# Patient Record
Sex: Male | Born: 1995 | Race: Black or African American | Hispanic: No | Marital: Single | Smoking: Current every day smoker
Health system: Southern US, Community
[De-identification: ages and names within clinical notes are randomized; demographics above are authoritative.]

## PROBLEM LIST (undated history)

## (undated) ENCOUNTER — Emergency Department: Discharge status: Absent without leave | Payer: Medicaid Other

## (undated) DIAGNOSIS — F29 Unspecified psychosis not due to a substance or known physiological condition: Secondary | ICD-10-CM

## (undated) DIAGNOSIS — F329 Major depressive disorder, single episode, unspecified: Secondary | ICD-10-CM

## (undated) DIAGNOSIS — F32A Depression, unspecified: Secondary | ICD-10-CM

## (undated) DIAGNOSIS — F259 Schizoaffective disorder, unspecified: Secondary | ICD-10-CM

## (undated) DIAGNOSIS — J45909 Unspecified asthma, uncomplicated: Secondary | ICD-10-CM

## (undated) DIAGNOSIS — R4689 Other symptoms and signs involving appearance and behavior: Secondary | ICD-10-CM

## (undated) HISTORY — PX: BACK SURGERY: SHX140

---
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
## 2017-02-03 DIAGNOSIS — F061 Catatonic disorder due to known physiological condition: Secondary | ICD-10-CM | POA: Insufficient documentation

## 2017-05-01 IMAGING — DX DG KNEE COMPLETE 4+V*L*
4 series · 4 of 4 positions shown · non-contrast
Comparison: None.

CLINICAL DATA: Pain to the bilateral knees after fall.

EXAM:
LEFT KNEE - COMPLETE 4+ VIEW

[knee ap]
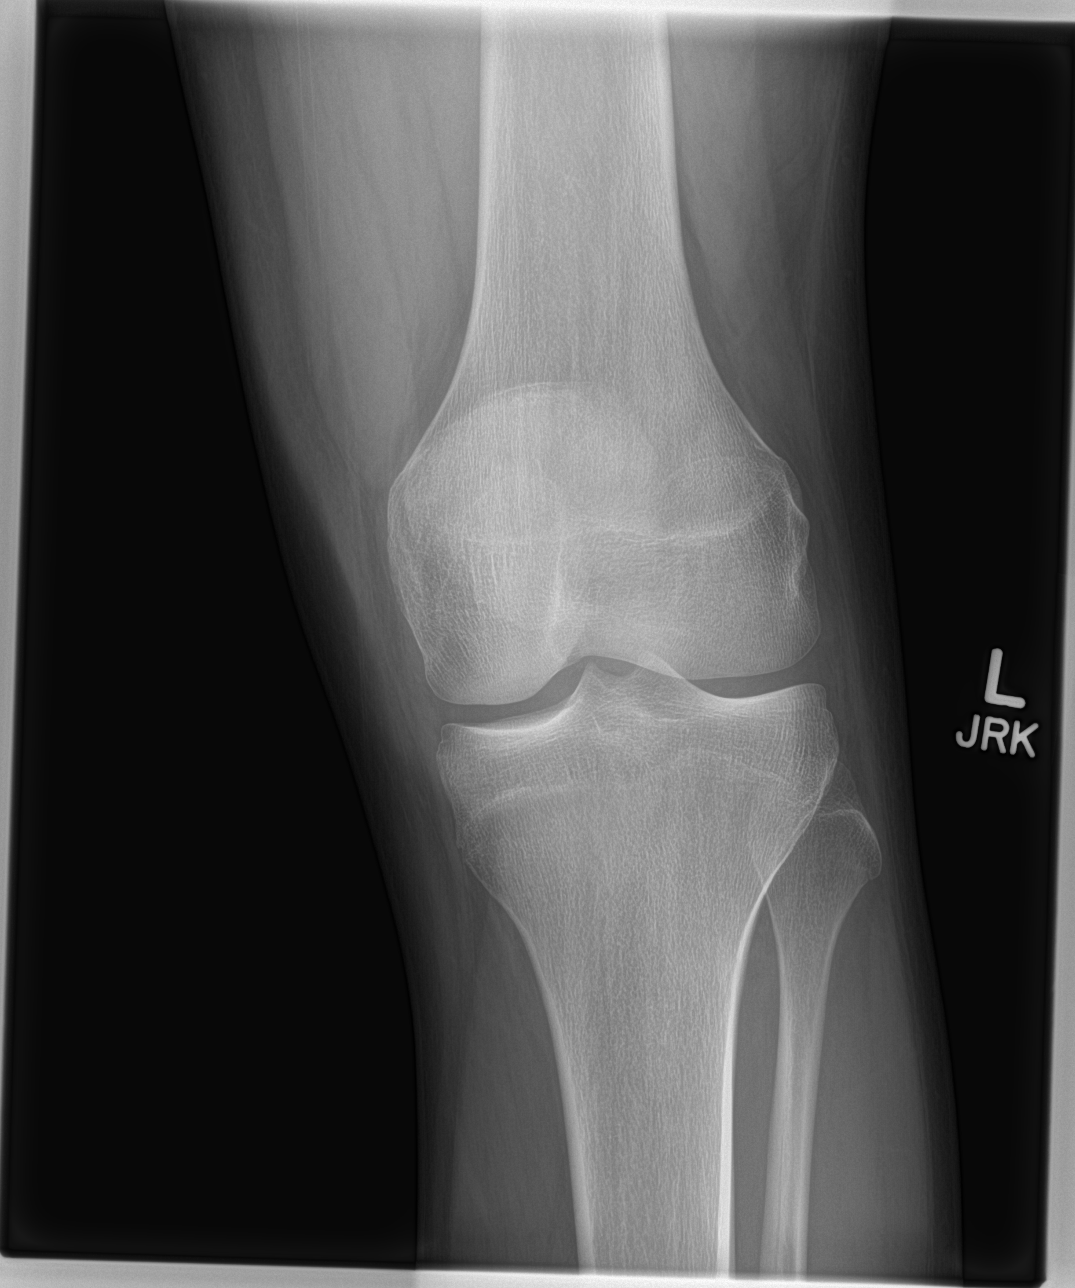

[knee obl (1 of 2)]
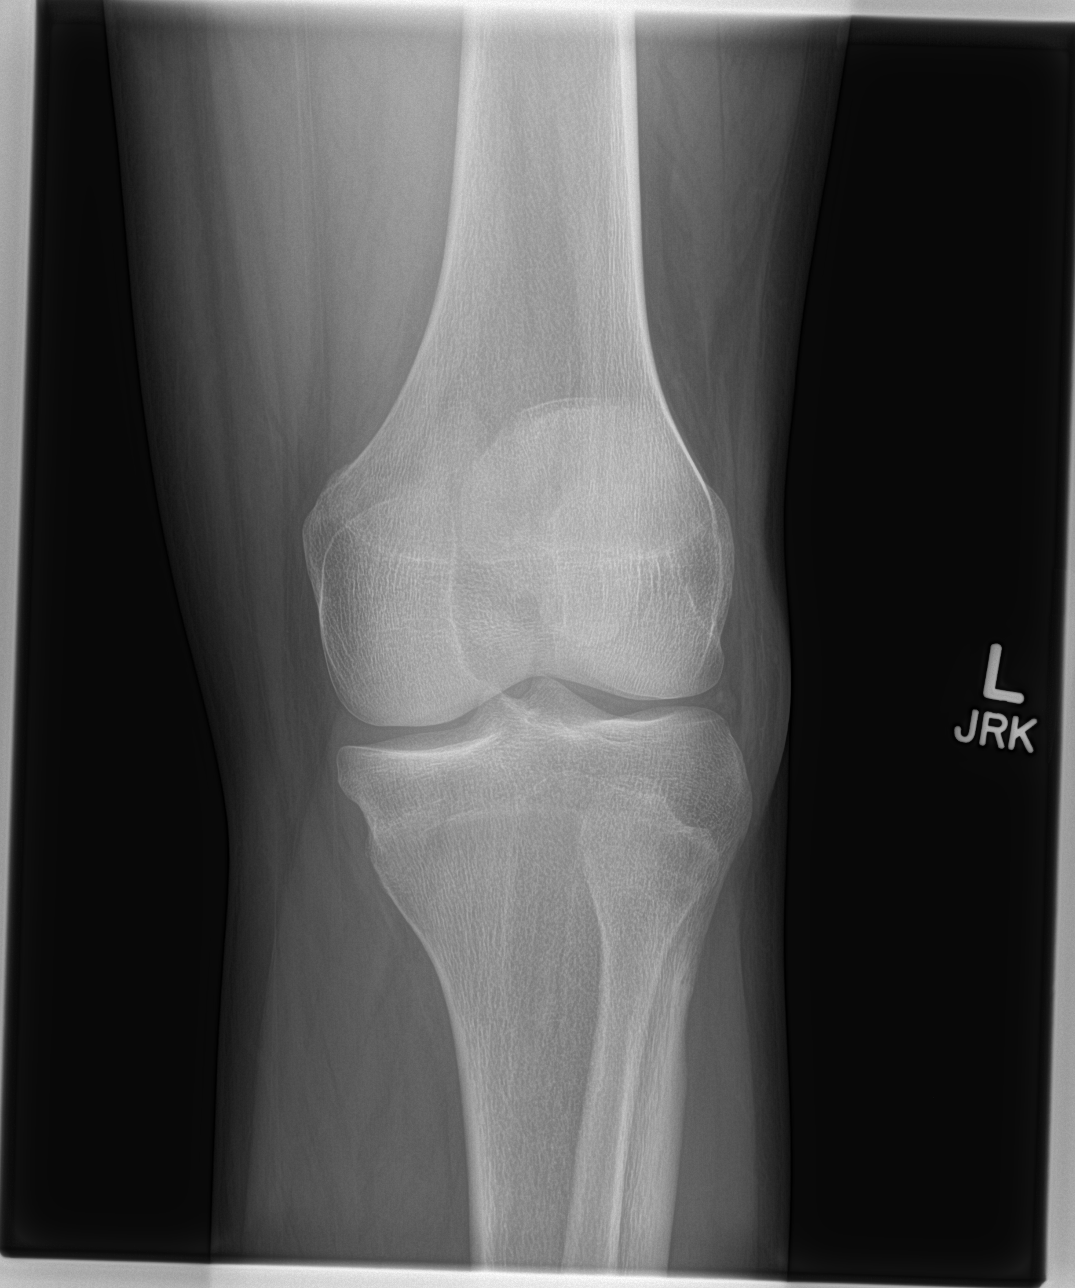

[knee obl (2 of 2)]
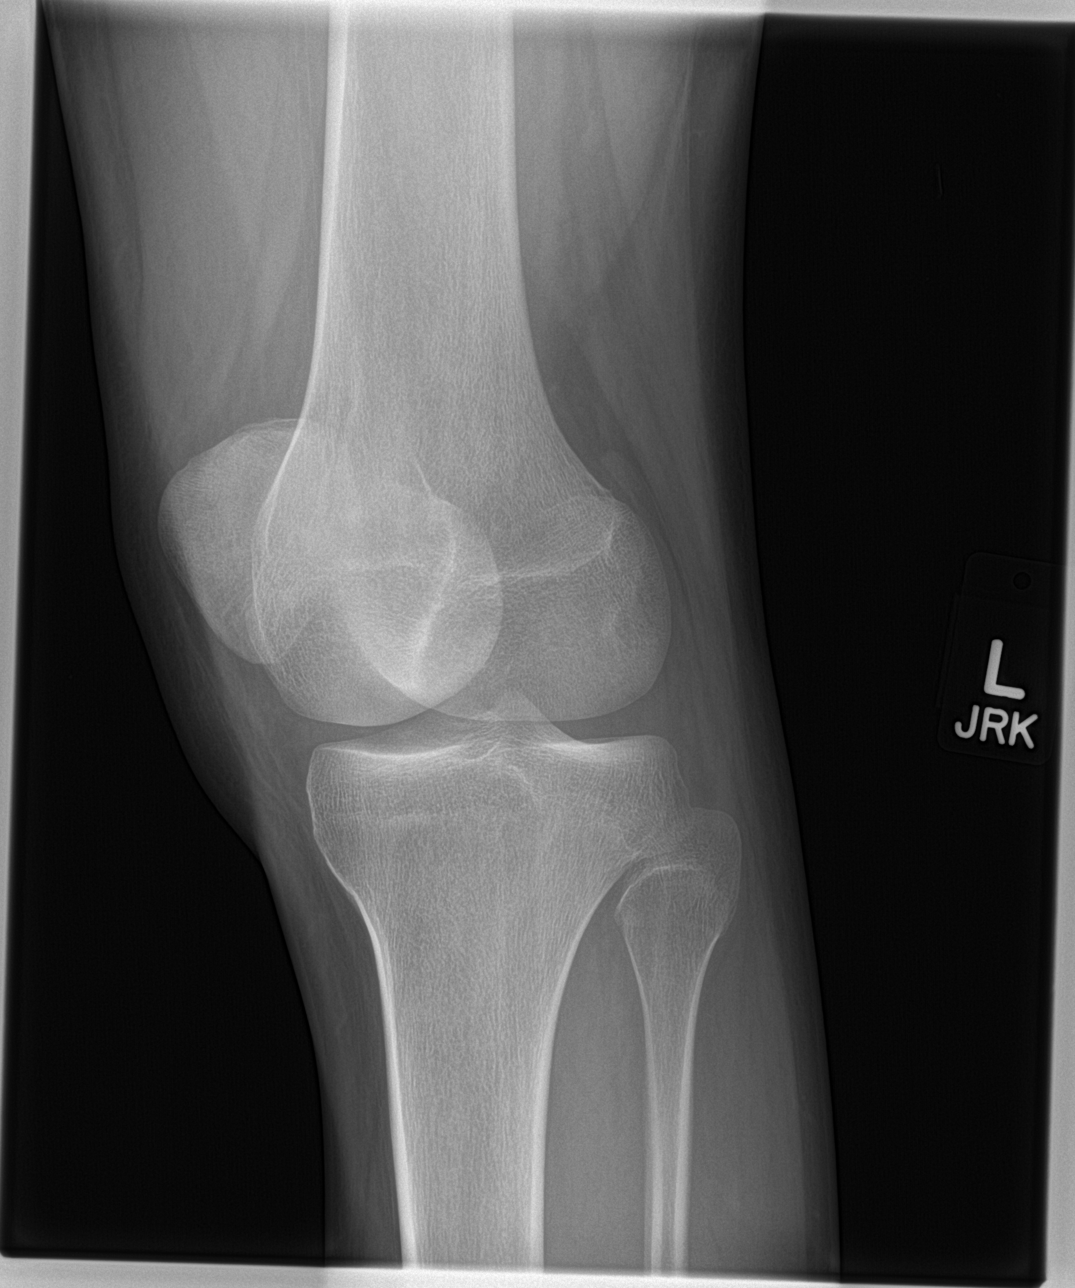

[knee lat]
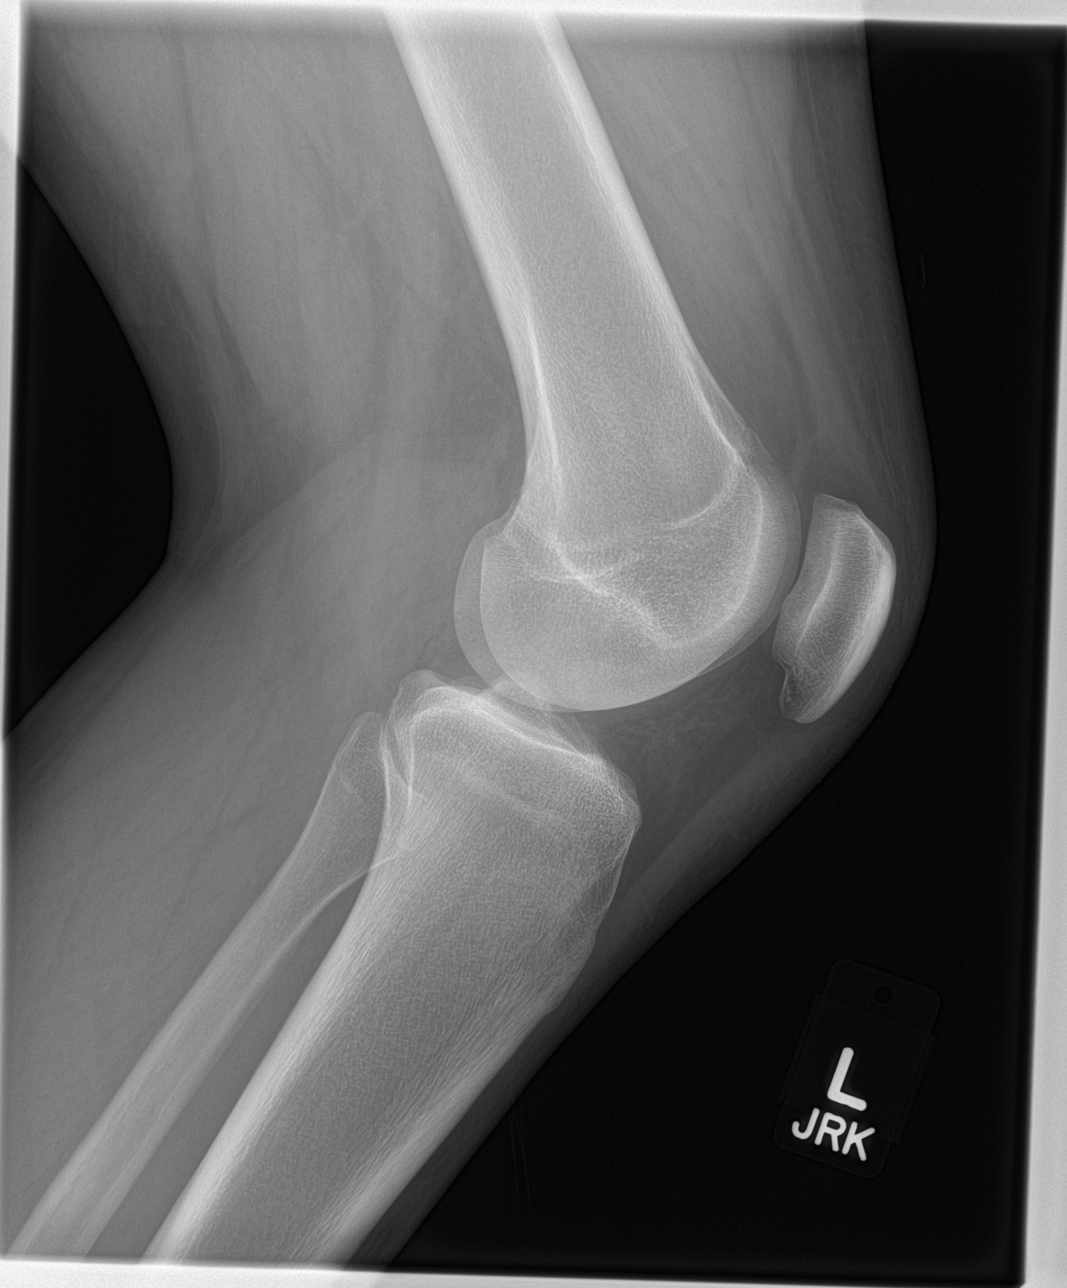

[4 of 4 positions shown; findings below may reference images not displayed]

FINDINGS: There is no evidence of fracture, dislocation, or joint effusion.
There is no evidence of arthropathy or other focal bone abnormality.
Soft tissues are unremarkable.
IMPRESSION: Negative.

## 2017-07-07 DIAGNOSIS — E669 Obesity, unspecified: Secondary | ICD-10-CM | POA: Insufficient documentation

## 2017-07-07 DIAGNOSIS — R293 Abnormal posture: Secondary | ICD-10-CM | POA: Insufficient documentation

## 2017-07-07 DIAGNOSIS — R030 Elevated blood-pressure reading, without diagnosis of hypertension: Secondary | ICD-10-CM | POA: Insufficient documentation

## 2017-07-07 DIAGNOSIS — R0683 Snoring: Secondary | ICD-10-CM | POA: Insufficient documentation

## 2017-08-25 DIAGNOSIS — B002 Herpesviral gingivostomatitis and pharyngotonsillitis: Secondary | ICD-10-CM | POA: Insufficient documentation

## 2017-12-30 ENCOUNTER — Other Ambulatory Visit: Payer: Self-pay

## 2017-12-30 ENCOUNTER — Emergency Department
Admission: EM | Admit: 2017-12-30 | Discharge: 2017-12-30 | Disposition: A | Discharge status: Home / Self Care | Payer: Medicaid Other | Attending: Emergency Medicine | Admitting: Emergency Medicine

## 2017-12-30 ENCOUNTER — Encounter: Payer: Self-pay | Admitting: Emergency Medicine

## 2017-12-30 ENCOUNTER — Inpatient Hospital Stay
Admission: AD | Admit: 2017-12-30 | Discharge: 2018-01-29 | DRG: 885 | Disposition: A | Discharge status: Home / Self Care | Payer: Medicaid Other | Attending: Psychiatry | Admitting: Psychiatry

## 2017-12-30 DIAGNOSIS — F209 Schizophrenia, unspecified: Secondary | ICD-10-CM | POA: Insufficient documentation

## 2017-12-30 DIAGNOSIS — G4700 Insomnia, unspecified: Secondary | ICD-10-CM | POA: Diagnosis present

## 2017-12-30 DIAGNOSIS — F250 Schizoaffective disorder, bipolar type: Principal | ICD-10-CM | POA: Diagnosis present

## 2017-12-30 DIAGNOSIS — F203 Undifferentiated schizophrenia: Secondary | ICD-10-CM

## 2017-12-30 DIAGNOSIS — Z91199 Patient's noncompliance with other medical treatment and regimen due to unspecified reason: Secondary | ICD-10-CM

## 2017-12-30 DIAGNOSIS — J45909 Unspecified asthma, uncomplicated: Secondary | ICD-10-CM | POA: Diagnosis present

## 2017-12-30 DIAGNOSIS — F99 Mental disorder, not otherwise specified: Secondary | ICD-10-CM | POA: Diagnosis present

## 2017-12-30 DIAGNOSIS — Z91148 Patient's other noncompliance with medication regimen for other reason: Secondary | ICD-10-CM

## 2017-12-30 DIAGNOSIS — Z9119 Patient's noncompliance with other medical treatment and regimen: Secondary | ICD-10-CM

## 2017-12-30 DIAGNOSIS — I10 Essential (primary) hypertension: Secondary | ICD-10-CM | POA: Diagnosis present

## 2017-12-30 DIAGNOSIS — F329 Major depressive disorder, single episode, unspecified: Secondary | ICD-10-CM | POA: Diagnosis not present

## 2017-12-30 DIAGNOSIS — Z9114 Patient's other noncompliance with medication regimen: Secondary | ICD-10-CM

## 2017-12-30 LAB — COMPREHENSIVE METABOLIC PANEL
ALBUMIN: 4.6 g/dL (ref 3.5–5.0)
ALT: 43 U/L (ref 17–63)
AST: 42 U/L — AB (ref 15–41)
Alkaline Phosphatase: 96 U/L (ref 38–126)
Anion gap: 10 (ref 5–15)
BUN: 12 mg/dL (ref 6–20)
CHLORIDE: 106 mmol/L (ref 101–111)
CO2: 23 mmol/L (ref 22–32)
Calcium: 9.3 mg/dL (ref 8.9–10.3)
Creatinine, Ser: 0.78 mg/dL (ref 0.61–1.24)
GFR calc Af Amer: >60 mL/min (ref >60)
GFR calc non Af Amer: >60 mL/min (ref >60)
GLUCOSE: 126 mg/dL — AB (ref 65–99)
POTASSIUM: 3.1 mmol/L — AB (ref 3.5–5.1)
Sodium: 139 mmol/L (ref 135–145)
Total Bilirubin: 1.2 mg/dL (ref 0.3–1.2)
Total Protein: 7.8 g/dL (ref 6.5–8.1)

## 2017-12-30 LAB — URINE DRUG SCREEN, QUALITATIVE (ARMC ONLY)
AMPHETAMINES, UR SCREEN: NOT DETECTED
BENZODIAZEPINE, UR SCRN: NOT DETECTED
Barbiturates, Ur Screen: NOT DETECTED
COCAINE METABOLITE, UR ~~LOC~~: NOT DETECTED
Cannabinoid 50 Ng, Ur ~~LOC~~: NOT DETECTED
MDMA (Ecstasy)Ur Screen: NOT DETECTED
Methadone Scn, Ur: NOT DETECTED
OPIATE, UR SCREEN: NOT DETECTED
Phencyclidine (PCP) Ur S: NOT DETECTED
Tricyclic, Ur Screen: NOT DETECTED

## 2017-12-30 LAB — CBC
HCT: 45.4 % (ref 40.0–52.0)
Hemoglobin: 15.9 g/dL (ref 13.0–18.0)
MCH: 28.9 pg (ref 26.0–34.0)
MCHC: 34.9 g/dL (ref 32.0–36.0)
MCV: 82.8 fL (ref 80.0–100.0)
PLATELETS: 274 10*3/uL (ref 150–440)
RBC: 5.49 MIL/uL (ref 4.40–5.90)
RDW: 14.1 % (ref 11.5–14.5)
WBC: 5.6 10*3/uL (ref 3.8–10.6)

## 2017-12-30 LAB — ETHANOL

## 2017-12-30 LAB — ACETAMINOPHEN LEVEL: Acetaminophen (Tylenol), Serum: <10 ug/mL — ABNORMAL LOW (ref 10–30)

## 2017-12-30 LAB — SALICYLATE LEVEL: Salicylate Lvl: <7 mg/dL (ref 2.8–30.0)

## 2017-12-30 MED ORDER — OLANZAPINE 5 MG PO TABS: 5.0000 mg | ORAL_TABLET | Freq: Every day | ORAL | Status: DC

## 2017-12-30 MED ORDER — ACETAMINOPHEN 325 MG PO TABS: 650.0000 mg | ORAL_TABLET | Freq: Four times a day (QID) | ORAL | Status: DC | PRN

## 2017-12-30 MED ORDER — ALUM & MAG HYDROXIDE-SIMETH 200-200-20 MG/5ML PO SUSP: 30.0000 mL | ORAL | Status: DC | PRN

## 2017-12-30 MED ORDER — HYDROXYZINE HCL 50 MG PO TABS
50.0000 mg | ORAL_TABLET | Freq: Three times a day (TID) | ORAL | Status: DC | PRN
  Administered 2017-12-30 – 2018-01-27 (×14): 50 mg via ORAL
  Filled 2017-12-30 (×15): qty 1

## 2017-12-30 MED ORDER — OLANZAPINE 5 MG PO TABS
5.0000 mg | ORAL_TABLET | Freq: Every day | ORAL | Status: DC
  Administered 2017-12-30 – 2017-12-31 (×2): 5 mg via ORAL
  Filled 2017-12-30 (×2): qty 1

## 2017-12-30 MED ORDER — MAGNESIUM HYDROXIDE 400 MG/5ML PO SUSP
30.0000 mL | Freq: Every day | ORAL | Status: DC | PRN
  Administered 2018-01-15: 30 mL via ORAL
  Filled 2017-12-30: qty 30

## 2017-12-30 MED ORDER — TRAZODONE HCL 100 MG PO TABS
100.0000 mg | ORAL_TABLET | Freq: Every evening | ORAL | Status: DC | PRN
  Administered 2017-12-30 – 2018-01-27 (×22): 100 mg via ORAL
  Filled 2017-12-30 (×22): qty 1

## 2017-12-30 MED ORDER — POTASSIUM CHLORIDE CRYS ER 20 MEQ PO TBCR
40.0000 meq | EXTENDED_RELEASE_TABLET | Freq: Once | ORAL | Status: AC
  Administered 2017-12-30: 40 meq via ORAL
  Filled 2017-12-30: qty 2

## 2017-12-30 NOTE — ED Notes (Signed)
BEHAVIORAL HEALTH ROUNDING Patient sleeping: No. Patient alert and oriented: yes Behavior appropriate: Yes.  ; If no, describe:  Nutrition and fluids offered: yes Toileting and hygiene offered: Yes  Sitter present: q15 minute observations and security camera monitoring Law enforcement present: Yes  ODS  

## 2017-12-30 NOTE — ED Notes (Signed)
Hourly rounding reveals patient sleeping in room. No complaints, stable, in no acute distress. Q15 minute rounds and monitoring via Security Cameras to continue. 

## 2017-12-30 NOTE — ED Notes (Signed)
BEHAVIORAL HEALTH ROUNDING Patient sleeping: No. Patient alert and oriented: yes Behavior appropriate: Yes.  ; If no, describe:  Nutrition and fluids offered: yes Toileting and hygiene offered: Yes  Sitter present: q15 minute observations and security camera monitoring Law enforcement present: Yes  ODS security   ENVIRONMENTAL ASSESSMENT Potentially harmful objects out of patient reach: Yes.   Personal belongings secured: Yes.   Patient dressed in hospital provided attire only: Yes.   Plastic bags out of patient reach: Yes.   Patient care equipment (cords, cables, call bells, lines, and drains) shortened, removed, or accounted for: Yes.   Equipment and supplies removed from bottom of stretcher: Yes.   Potentially toxic materials out of patient reach: Yes.   Sharps container removed or out of patient reach: Yes.

## 2017-12-30 NOTE — Tx Team (Signed)
Received [name] [name] from there ED, he is alert but very disorganized, distracted, restless and unable to answer the admission questions. H is eyes are red with a distance stare.  He verbalized he is here because of trouble with his family and mother He ate a snack and sandwich and was escorted to his room. He was redirected back to his room on several occassions. The skin assessment was completed with nurse Jon GillsAlexis.
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
## 2017-12-30 NOTE — ED Notes (Signed)
ED  Is the patient under IVC or is there intent for IVC: Yes.   Is the patient medically cleared: Yes.   Is there vacancy in the ED BHU: Yes.   Is the population mix appropriate for patient: Yes.   Is the patient awaiting placement in inpatient or outpatient setting: Yes.   Has the patient had a psychiatric consult:  Consult pending Survey of unit performed for contraband, proper placement and condition of furniture, tampering with fixtures in bathroom, shower, and each patient room: Yes.  ; Findings:  APPEARANCE/BEHAVIOR Calm and cooperative NEURO ASSESSMENT Orientation: oriented x3  Denies pain Hallucinations:   Speech: Normal Gait: normal RESPIRATORY ASSESSMENT Even  Unlabored respirations  CARDIOVASCULAR ASSESSMENT Pulses equal   regular rate  Skin warm and dry   GASTROINTESTINAL ASSESSMENT no GI complaint EXTREMITIES Full ROM  PLAN OF CARE Provide calm/safe environment. Vital signs assessed twice daily. ED BHU Assessment once each 12-hour shift. Collaborate with TTS daily or as condition indicates. Assure the ED provider has rounded once each shift. Provide and encourage hygiene. Provide redirection as needed. Assess for escalating behavior; address immediately and inform ED provider.  Assess family dynamic and appropriateness for visitation as needed: Yes.  ; If necessary, describe findings:  Educate the patient/family about BHU procedures/visitation: Yes.  ; If necessary, describe findings:
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
## 2017-12-30 NOTE — ED Notes (Signed)
BEHAVIORAL HEALTH ROUNDING Patient sleeping: Yes.   Patient alert and oriented: eyes closed  Appears asleep Behavior appropriate: Yes.  ; If no, describe:  Nutrition and fluids offered: Yes  Toileting and hygiene offered: sleeping Sitter present: q 15 minute observations and security camera monitoring Law enforcement present: yes  ODS 

## 2017-12-30 NOTE — Tx Team (Signed)
Initial Treatment Plan 12/30/2017 10:10 PM [name] [name] [id number]    PATIENT STRESSORS: Medication change or noncompliance   PATIENT STRENGTHS: Capable of independent living Manufacturing systems engineerCommunication skills Physical Health Supportive family/friends   PATIENT IDENTIFIED PROBLEMS: Non compliance with medications   Difficult relationship with mother                    DISCHARGE CRITERIA:  Ability to meet basic life and health needs Adequate post-discharge living arrangements Improved stabilization in mood, thinking, and/or behavior  PRELIMINARY DISCHARGE PLAN: Outpatient therapy Return to previous living arrangement Return to previous work or school arrangements  PATIENT/FAMILY INVOLVEMENT: This treatment plan has been presented to and reviewed with the patient, [name] [name].  The patient has been given the opportunity to ask questions and make suggestions.  Rex KrasJoanne  Loryn Haacke, RN 12/30/2017, 10:10 PM

## 2017-12-30 NOTE — BH Assessment (Signed)
[facility] by Dr. Toni Amendlapacs.  Attending Physician will be Dr. Jennet MaduroPucilowska.   [facility] [place]wen F.   Intake Paper Work has been signed and placed on patient chart.  ER staff is aware of the admission:  Misty StanleyLisa, ER Kathrynn SpeedSectary   Dr. Derrill KayGoodman, ER MD   Amy T., Patient's Nurse   Mertie ClauseJeanelle, Patient Access.

## 2017-12-30 NOTE — ED Notes (Signed)
Hourly rounding reveals patient in room. No complaints, stable, in no acute distress. Q15 minute rounds and monitoring via Security Cameras to continue. 

## 2017-12-30 NOTE — ED Notes (Addendum)
Patient observed lying in bed with eyes closed  Even, unlabored respirations observed   NAD pt appears to be sleeping  I will continue to monitor along with every 15 minute visual observations and ongoing security camera monitoring    

## 2017-12-30 NOTE — Consult Note (Signed)
[facility] Face-to-Face Psychiatry Consult   Reason for Consult: Consult for the 22 year old man with a history of psychotic disorder brought here under IVC filed by mobile crisis. Referring Physician: Archie Balboa Patient Identification: [name] [name] MRN:  [id number] Principal Diagnosis: Schizophrenia, undifferentiated ([place]) Diagnosis:   Patient Active Problem List   Diagnosis Date Noted  . Schizophrenia, undifferentiated ([place]) [F20.3] 12/30/2017  . Noncompliance [Z91.19] 12/30/2017  . Aggressive behavior of adolescent [R46.89] 10/24/2015    Total Time spent with patient: 1 hour  Subjective:   [name] [name] is a 22 y.o. male patient admitted with "I think my mother worries too much".  HPI: Patient interviewed chart reviewed.  22 year old man with a history of psychotic disorder brought in under IVC filed by mobile crisis.  The patient's story is that he had decided to go out for a walk in the night time to walk around the neighborhood and go to a local park.  He says that his mother was bothered by this but he cannot speculate exactly why.  He says that "my mother is bothered by my expression of young manhood".  Patient denies having any other odd behavior.  According to the commitment paperwork however he had been found sitting in a car belonging to someone else acting like he was driving the car.  Mobile crisis was called and spent some time talking with him and says the more they talk with him the more they felt that he was psychotic and would benefit from hospitalization.  Patient admits that he has been off of his psychiatric medicine for at least 6 months.  He does not feel like he really needs it.  He denies that he has been having auditory or visual hallucinations.  He denies feeling paranoid.  Some of the collateral information however suggests that he was thinking about killing himself or at least making some odd comments.  Patient denies that he is using drugs or alcohol.  Insight is  poor.  Social history: Lives with his mother and younger brother.  Not currently working or going to school.  Not doing much during the day.  Dropped out of high school because of mental illness but got a GED.  Father not involved in his life.  Patient seems to have some awareness that guardianship was an issue that had been brought up and expresses a fear that that would happen.  Medical history: No significant medical problems known outside of the psychiatric  Substance abuse history: Denies alcohol or drug abuse    Past Psychiatric History: Patient had several hospitalizations at [facility] before he was 56.  Diagnosis was made of schizophrenia.  Has been treated outpatient with medication including clozapine and Zyprexa.  Poor insight.  Denies any history of suicide attempts or violence.  Risk to Self: Suicidal Ideation: No Suicidal Intent: No Is patient at risk for suicide?: No Suicidal Plan?: No Access to Means: No What has been your use of drugs/alcohol within the last 12 months?: Reports of none How many times?: 0 Other Self Harm Risks: Reports of none Triggers for Past Attempts: None known Intentional Self Injurious Behavior: None Risk to Others: Homicidal Ideation: No Thoughts of Harm to Others: No Current Homicidal Intent: No Current Homicidal Plan: No Access to Homicidal Means: No Identified Victim: Reports of none History of harm to others?: No Assessment of Violence: None Noted Violent Behavior Description: Reports of none Does patient have access to weapons?: No Criminal Charges Pending?: No Does patient have a court  date: No Prior Inpatient Therapy: Prior Inpatient Therapy: Yes Prior Therapy Dates: 10/2016, 12/2015 & 10/2015 Prior Therapy Facilty/Provider(s): [facility] Reason for Treatment: Schizophrenia Prior Outpatient Therapy: Prior Outpatient Therapy: Yes Prior Therapy Dates: 2019 Prior Therapy Facilty/Provider(s): [facility] Reason for Treatment: Schizophrenia Does  patient have an ACCT team?: No Does patient have Intensive In-House Services?  : No Does patient have [facility] services? : No Does patient have P4CC services?: No  Past Medical History:  Past Medical History:  Diagnosis Date  . Asthma   . Depression   . Psychosis [facility])     Past Surgical History:  Procedure Laterality Date  . BACK SURGERY     Family History: No family history on file. Family Psychiatric  History: Does not know of any Social History:  Social History   Substance and Sexual Activity  Alcohol Use No     Social History   Substance and Sexual Activity  Drug Use Yes  . Types: Marijuana    Social History   Socioeconomic History  . Marital status: Single    Spouse name: Not on file  . Number of children: Not on file  . Years of education: Not on file  . Highest education level: Not on file  Occupational History  . Not on file  Social Needs  . Financial resource strain: Not on file  . Food insecurity:    Worry: Not on file    Inability: Not on file  . Transportation needs:    Medical: Not on file    Non-medical: Not on file  Tobacco Use  . Smoking status: Never Smoker  . Smokeless tobacco: Never Used  Substance and Sexual Activity  . Alcohol use: No  . Drug use: Yes    Types: Marijuana  . Sexual activity: Not on file  Lifestyle  . Physical activity:    Days per week: Not on file    Minutes per session: Not on file  . Stress: Not on file  Relationships  . Social connections:    Talks on phone: Not on file    Gets together: Not on file    Attends religious service: Not on file    Active member of club or organization: Not on file    Attends meetings of clubs or organizations: Not on file    Relationship status: Not on file  Other Topics Concern  . Not on file  Social History Narrative  . Not on file   Additional Social History:    Allergies:  No Known Allergies  Labs:  Results for orders placed or performed during the hospital  encounter of 12/30/17 (from the past 48 hour(s))  CBC     Status: None   Collection Time: 12/30/17  1:23 AM  Result Value Ref Range   WBC 5.6 3.8 - 10.6 K/uL   RBC 5.49 4.40 - 5.90 MIL/uL   Hemoglobin 15.9 13.0 - 18.0 g/dL   HCT 45.4 40.0 - 52.0 %   MCV 82.8 80.0 - 100.0 fL   MCH 28.9 26.0 - 34.0 pg   MCHC 34.9 32.0 - 36.0 g/dL   RDW 14.1 11.5 - 14.5 %   Platelets 274 150 - 440 K/uL    Comment: Performed at [facility], [address] [place], [place] [address]  Comprehensive metabolic panel     Status: Abnormal   Collection Time: 12/30/17  1:23 AM  Result Value Ref Range   Sodium 139 135 - 145 mmol/L   Potassium 3.1 (  L) 3.5 - 5.1 mmol/L   Chloride 106 101 - 111 mmol/L   CO2 23 22 - 32 mmol/L   Glucose, Bld 126 (H) 65 - 99 mg/dL   BUN 12 6 - 20 mg/dL   Creatinine, Ser 0.78 0.61 - 1.24 mg/dL   Calcium 9.3 8.9 - 10.3 mg/dL   Total Protein 7.8 6.5 - 8.1 g/dL   Albumin 4.6 3.5 - 5.0 g/dL   AST 42 (H) 15 - 41 U/L   ALT 43 17 - 63 U/L   Alkaline Phosphatase 96 38 - 126 U/L   Total Bilirubin 1.2 0.3 - 1.2 mg/dL   GFR calc non Af Amer >60 >60 mL/min   GFR calc Af Amer >60 >60 mL/min    Comment: (NOTE) The eGFR has been calculated using the CKD EPI equation. This calculation has not been validated in all clinical situations. eGFR's persistently <60 mL/min signify possible Chronic Kidney Disease.    Anion gap 10 5 - 15    Comment: Performed at [facility], [address] [place], [place] [address]  Ethanol     Status: None   Collection Time: 12/30/17  1:23 AM  Result Value Ref Range   Alcohol, Ethyl (B) <10 <10 mg/dL    Comment: (NOTE) Lowest detectable limit for serum alcohol is 10 mg/dL. For medical purposes only. Performed at [facility], [address] [place], [place] [address]   Salicylate level     Status: None   Collection Time: 12/30/17  1:23 AM  Result Value Ref Range   Salicylate Lvl <6.9 2.8 - 30.0 mg/dL    Comment:  Performed at [facility], [address] [place], [place] [address]  Urine Drug Screen, Qualitative ([facility] only)     Status: None   Collection Time: 12/30/17  1:23 AM  Result Value Ref Range   Tricyclic, Ur Screen NONE DETECTED NONE DETECTED   Amphetamines, Ur Screen NONE DETECTED NONE DETECTED   MDMA (Ecstasy)Ur Screen NONE DETECTED NONE DETECTED   Cocaine Metabolite,Ur [place] NONE DETECTED NONE DETECTED   Opiate, Ur Screen NONE DETECTED NONE DETECTED   Phencyclidine (PCP) Ur S NONE DETECTED NONE DETECTED   Cannabinoid 50 Ng, Ur [place] NONE DETECTED NONE DETECTED   Barbiturates, Ur Screen NONE DETECTED NONE DETECTED   Benzodiazepine, Ur Scrn NONE DETECTED NONE DETECTED   Methadone Scn, Ur NONE DETECTED NONE DETECTED    Comment: (NOTE) Tricyclics + metabolites, urine    Cutoff 1000 ng/mL Amphetamines + metabolites, urine  Cutoff 1000 ng/mL MDMA (Ecstasy), urine              Cutoff 500 ng/mL Cocaine Metabolite, urine          Cutoff 300 ng/mL Opiate + metabolites, urine        Cutoff 300 ng/mL Phencyclidine (PCP), urine         Cutoff 25 ng/mL Cannabinoid, urine                 Cutoff 50 ng/mL Barbiturates + metabolites, urine  Cutoff 200 ng/mL Benzodiazepine, urine              Cutoff 200 ng/mL Methadone, urine                   Cutoff 300 ng/mL The urine drug screen provides only a preliminary, unconfirmed analytical test result and should not be used for non-medical purposes. Clinical consideration and professional judgment should be applied to any positive drug  screen result due to possible interfering substances. A more specific alternate chemical method must be used in order to obtain a confirmed analytical result. Gas chromatography / mass spectrometry (GC/MS) is the preferred confirmat ory method. Performed at [facility], [address] [place], [place] [address]   Acetaminophen level     Status: Abnormal   Collection Time: 12/30/17  1:23 AM  Result Value  Ref Range   Acetaminophen (Tylenol), Serum <10 (L) 10 - 30 ug/mL    Comment: (NOTE) Therapeutic concentrations vary significantly. A range of 10-30 ug/mL  may be an effective concentration for many patients. However, some  are best treated at concentrations outside of this range. Acetaminophen concentrations >150 ug/mL at 4 hours after ingestion  and >50 ug/mL at 12 hours after ingestion are often associated with  toxic reactions. Performed at [facility], [address] [place], [place] [address]     No current facility-administered medications for this encounter.    Current Outpatient Medications  Medication Sig Dispense Refill  . doxycycline (VIBRA-TABS) 100 MG tablet Take 1 tablet (100 mg total) by mouth 2 (two) times daily. (Patient not taking: Reported on 12/30/2017) 19 tablet 0  . HYDROcodone-acetaminophen (NORCO) 5-325 MG tablet Take 1 tablet by mouth every 6 (six) hours as needed. (Patient not taking: Reported on 12/30/2017) 10 tablet 0    Musculoskeletal: Strength & Muscle Tone: within normal limits Gait & Station: normal Patient leans: N/A  Psychiatric Specialty Exam: Physical Exam  Nursing note and vitals reviewed. Constitutional: He appears well-developed and well-nourished.  HENT:  Head: Normocephalic and atraumatic.  Eyes: Pupils are equal, round, and reactive to light. Conjunctivae are normal.  Neck: Normal range of motion.  Cardiovascular: Regular rhythm and normal heart sounds.  Respiratory: Effort normal. No respiratory distress.  GI: Soft.  Musculoskeletal: Normal range of motion.  Neurological: He is alert.  Skin: Skin is warm and dry.  Psychiatric: His affect is blunt. His speech is delayed. He is slowed. Thought content is paranoid. Cognition and memory are impaired. He expresses inappropriate judgment. He expresses no homicidal and no suicidal ideation. He exhibits abnormal recent memory.    Review of Systems  Constitutional: Negative.    HENT: Negative.   Eyes: Negative.   Respiratory: Negative.   Cardiovascular: Negative.   Gastrointestinal: Negative.   Musculoskeletal: Negative.   Skin: Negative.   Neurological: Negative.   Psychiatric/Behavioral: Positive for memory loss. Negative for depression, hallucinations, substance abuse and suicidal ideas. The patient is nervous/anxious and has insomnia.     Blood pressure (!) 134/92, pulse 86, temperature 98.5 F (36.9 C), temperature source Oral, resp. rate 17, height 6' (1.829 m), weight 86.2 kg (190 lb), SpO2 100 %.Body mass index is 25.77 kg/m.  General Appearance: Fairly Groomed  Eye Contact:  Minimal  Speech:  Garbled  Volume:  Decreased  Mood:  Anxious and Dysphoric  Affect:  Constricted  Thought Process:  Disorganized  Orientation:  Full (Time, Place, and Person)  Thought Content:  Illogical  Suicidal Thoughts:  No  Homicidal Thoughts:  No  Memory:  Immediate;   Fair Recent;   Poor Remote;   Fair  Judgement:  Poor  Insight:  Lacking  Psychomotor Activity:  Decreased  Concentration:  Concentration: Fair  Recall:  [employer] of Knowledge:  Fair  Language:  Fair  Akathisia:  No  Handed:  Right  AIMS (if indicated):     Assets:  Housing Physical Health Resilience Social Support  ADL's:  Intact  Cognition:  Impaired,  Mild  Sleep:        Treatment Plan Summary: Daily contact with patient to assess and evaluate symptoms and progress in treatment, Medication management and Plan 22 year old man with a history of schizophrenia diagnosis.  Has been off of medication for many months because of his poor insight.  Patient presents with some disorganized thinking.  Has odd stereotyped behavior.  Has difficulty getting his thoughts together.  I suspect the diagnosis of schizophrenia is probably correct.  It sounds like he has been having more disturbing behavior at home.  Not really able to make clear decisions for himself at this point.  I agree with the plan  for admission to the psychiatric unit.  Restart low-dose Zyprexa.  According to the chart he has had problems with tolerating Haldol and Abilify in the past.  Used to take clozapine but was not really compliant.  Continue IV C.  Full set of labs will be done.  Case reviewed with ER physician and TTS.  Disposition: Recommend psychiatric Inpatient admission when medically cleared. Supportive therapy provided about ongoing stressors. Discussed crisis plan, support from social network, calling 911, coming to the Emergency Department, and calling Suicide Hotline.  Alethia Berthold, MD 12/30/2017 4:29 PM

## 2017-12-30 NOTE — ED Notes (Signed)
Report to include Situation, Background, Assessment, and Recommendations received from Amy Teague RN. Patient alert and oriented, warm and dry, in no acute distress. Patient denies SI, HI, AVH and pain. Patient made aware of Q15 minute rounds and security cameras for their safety. Patient instructed to come to me with needs or concerns. 

## 2017-12-30 NOTE — ED Notes (Signed)
Pt. Transferred to BHU from ED to room 5 after screening for contraband. Report to include Situation, Background, Assessment and Recommendations from Kim Summers RN. Pt. Oriented to unit including Q15 minute rounds as well as the security cameras for their protection. Patient is alert and oriented, warm and dry in no acute distress. Patient denies SI, HI, and AVH. Pt. Encouraged to let me know if needs arise. 

## 2017-12-30 NOTE — BH Assessment (Signed)
Pt asleep, unable to assess at this time.

## 2017-12-31 ENCOUNTER — Encounter: Payer: Self-pay | Admitting: Psychiatry

## 2017-12-31 DIAGNOSIS — F203 Undifferentiated schizophrenia: Secondary | ICD-10-CM

## 2017-12-31 LAB — CBC WITH DIFFERENTIAL/PLATELET
BASOS ABS: 0 10*3/uL (ref 0–0.1)
BASOS PCT: 1 %
EOS PCT: 0 %
Eosinophils Absolute: 0 10*3/uL (ref 0–0.7)
HEMATOCRIT: 42 % (ref 40.0–52.0)
HEMOGLOBIN: 14.8 g/dL (ref 13.0–18.0)
Lymphocytes Relative: 42 %
Lymphs Abs: 1.8 10*3/uL (ref 1.0–3.6)
MCH: 29.4 pg (ref 26.0–34.0)
MCHC: 35.2 g/dL (ref 32.0–36.0)
MCV: 83.5 fL (ref 80.0–100.0)
MONO ABS: 0.3 10*3/uL (ref 0.2–1.0)
MONOS PCT: 7 %
Neutro Abs: 2.1 10*3/uL (ref 1.4–6.5)
Neutrophils Relative %: 50 %
Platelets: 234 10*3/uL (ref 150–440)
RBC: 5.03 MIL/uL (ref 4.40–5.90)
RDW: 14.4 % (ref 11.5–14.5)
WBC: 4.3 10*3/uL (ref 3.8–10.6)

## 2017-12-31 LAB — TSH: TSH: 1.023 u[IU]/mL (ref 0.350–4.500)

## 2017-12-31 LAB — HEMOGLOBIN A1C
HEMOGLOBIN A1C: 4.6 % — AB (ref 4.8–5.6)
Mean Plasma Glucose: 85.32 mg/dL

## 2017-12-31 LAB — LIPID PANEL
Cholesterol: 185 mg/dL (ref 0–200)
HDL: 63 mg/dL (ref >40)
LDL CALC: 97 mg/dL (ref 0–99)
TRIGLYCERIDES: 125 mg/dL (ref <150)
Total CHOL/HDL Ratio: 2.9 RATIO
VLDL: 25 mg/dL (ref 0–40)

## 2017-12-31 MED ORDER — CLOZAPINE 25 MG PO TABS
50.0000 mg | ORAL_TABLET | Freq: Every day | ORAL | Status: DC
  Administered 2017-12-31: 50 mg via ORAL
  Filled 2017-12-31: qty 2

## 2017-12-31 NOTE — H&P (Addendum)
Psychiatric Admission Assessment Adult  Patient Identification: [name] [name] MRN:  [id number] Date of Evaluation:  12/31/2017 Chief Complaint:  Depression Principal Diagnosis: Schizophrenia, undifferentiated (HCC) Diagnosis:   Patient Active Problem List   Diagnosis Date Noted  . Schizophrenia, undifferentiated (HCC) [F20.3] 12/30/2017    Priority: High  . Noncompliance [Z91.19] 12/30/2017  . Aggressive behavior of adolescent [R46.89] 10/24/2015   History of Present Illness:   Identifying data. [name] [name] is a 22 year old male with schizophrenia.  Chief complaint. "I don't want to take medications and want to leave."  History of present illness. Information was obtained from the patient and the chart. The patient was brought to the ER by the police after several episodes of strange and out of character behaviors. After he stopped taking medications in December of 2018 because "he did not need them" he became increasingly psychotic and disorganized. He started hallucinating, talking to his voices laughing inappropriately. He became aggressive towards his family pushing his mother. He was loitering at the book store and when asked to leave, he broke into a car at tha parking lot and pretended he were driving. He was charged with property damage. Crisis team was called to the house several times until they decided he needed to come to the hospital. The patient himself denies any problems, insists on leaving immediately and moving in with a friend in [place]. He refused medications and EKG today. He answers my questions reluctantly but worms up as the interview progresses. He however is vague. He believes that "problems at home" brought him here but nothing is wrong with him. He needs no medications. He denies substance abuse and was negative for substances on admission.  Past psychiatric history. Diagnosed with schizophrenia 2 years ago and hospitalized at [facility] on adolescent unit. He  then followed up with [facility]. According to the mother, he had a terrible reaction to Haldol or Abilify and had to spend 3 months in the hospital.He only responds to Clozapine but started refusing lab work. He was in the care of [facility] team as long as he had Medicaid. Medicaid was reinstated now.  Family psychiatric history. None.  Social history. Dropped out of school due to mental illness but got GEDs. He lives with his miother and brother.  Total Time spent with patient: 1 hour  Is the patient at risk to self? No.  Has the patient been a risk to self in the past 6 months? No.  Has the patient been a risk to self within the distant past? No.  Is the patient a risk to others? No.  Has the patient been a risk to others in the past 6 months? No.  Has the patient been a risk to others within the distant past? No.   Prior Inpatient Therapy:   Prior Outpatient Therapy:    Alcohol Screening: 1. How often do you have a drink containing alcohol?: Never 2. How many drinks containing alcohol do you have on a typical day when you are drinking?: 1 or 2 3. How often do you have six or more drinks on one occasion?: Never AUDIT-C Score: 0 4. How often during the last year have you found that you were not able to stop drinking once you had started?: Never 5. How often during the last year have you failed to do what was normally expected from you becasue of drinking?: Never 6. How often during the last year have you needed a first drink in the morning to  get yourself going after a heavy drinking session?: Never 7. How often during the last year have you had a feeling of guilt of remorse after drinking?: Never 8. How often during the last year have you been unable to remember what happened the night before because you had been drinking?: Never 9. Have you or someone else been injured as a result of your drinking?: No 10. Has a relative or friend or a doctor or another health worker been concerned about  your drinking or suggested you cut down?: No Alcohol Use Disorder Identification Test Final Score (AUDIT): 0 Intervention/Follow-up: AUDIT Score <7 follow-up not indicated Substance Abuse History in the last 12 months:  No. Consequences of Substance Abuse: NA Previous Psychotropic Medications: Yes  Psychological Evaluations: No  Past Medical History:  Past Medical History:  Diagnosis Date  . Asthma   . Depression   . Psychosis [facility])     Past Surgical History:  Procedure Laterality Date  . BACK SURGERY     Family History: History reviewed. No pertinent family history.   Tobacco Screening: Have you used any form of tobacco in the last 30 days? (Cigarettes, Smokeless Tobacco, Cigars, and/or Pipes): No Social History:  Social History   Substance and Sexual Activity  Alcohol Use No     Social History   Substance and Sexual Activity  Drug Use Yes  . Types: Marijuana    Additional Social History: Marital status: Single Are you sexually active?: No What is your sexual orientation?: Heterosexual Has your sexual activity been affected by drugs, alcohol, medication, or emotional stress?: N/A Does patient have children?: No                         Allergies:  No Known Allergies Lab Results:  Results for orders placed or performed during the hospital encounter of 12/30/17 (from the past 48 hour(s))  Hemoglobin A1c     Status: Abnormal   Collection Time: 12/30/17  1:23 AM  Result Value Ref Range   Hgb A1c MFr Bld 4.6 (L) 4.8 - 5.6 %    Comment: (NOTE) Pre diabetes:          5.7%-6.4% Diabetes:              >6.4% Glycemic control for   <7.0% adults with diabetes    Mean Plasma Glucose 85.32 mg/dL    Comment: Performed at [facility], [address], [place], [place] [address]  Lipid panel     Status: None   Collection Time: 12/30/17  1:23 AM  Result Value Ref Range   Cholesterol 185 0 - 200 mg/dL   Triglycerides 604 <540 mg/dL   HDL 63 >98 mg/dL    Total CHOL/HDL Ratio 2.9 RATIO   VLDL 25 0 - 40 mg/dL   LDL Cholesterol 97 0 - 99 mg/dL    Comment:        Total Cholesterol/HDL:CHD Risk Coronary Heart Disease Risk Table                     Men   Women  1/2 Average Risk   3.4   3.3  Average Risk       5.0   4.4  2 X Average Risk   9.6   7.1  3 X Average Risk  23.4   11.0        Use the calculated Patient Ratio above and the CHD Risk Table to  determine the patient's CHD Risk.        ATP III CLASSIFICATION (LDL):  <100     mg/dL   Optimal  100-129  mg/dL   Near or Above                    Optimal  130-159  mg/dL   Borderline  [id number]  mg/dL   High  >811>190     mg/dL   Very High Performed at [facility], [address] [place], [place] [address]   [id number]     Status: None   Collection Time: 12/30/17  1:23 AM  Result Value Ref Range   TSH 1.023 0.350 - 4.500 uIU/mL    Comment: Performed by a 3rd Generation assay with a functional sensitivity of <=0.01 uIU/mL. Performed at [facility], [address] [place], [place] [address]   CBC with Differential/Platelet     Status: None   Collection Time: 12/31/17  9:20 AM  Result Value Ref Range   WBC 4.3 3.8 - 10.6 K/uL   RBC 5.03 4.40 - 5.90 MIL/uL   Hemoglobin 14.8 13.0 - 18.0 g/dL   HCT 13.042.0 86.540.0 - 78.452.0 %   MCV 83.5 80.0 - 100.0 fL   MCH 29.4 26.0 - 34.0 pg   MCHC 35.2 32.0 - 36.0 g/dL   RDW 69.614.4 29.511.5 - 28.414.5 %   Platelets 234 150 - 440 K/uL   Neutrophils Relative % 50 %   Neutro Abs 2.1 1.4 - 6.5 K/uL   Lymphocytes Relative 42 %   Lymphs Abs 1.8 1.0 - 3.6 K/uL   Monocytes Relative 7 %   Monocytes Absolute 0.3 0.2 - 1.0 K/uL   Eosinophils Relative 0 %   Eosinophils Absolute 0.0 0 - 0.7 K/uL   Basophils Relative 1 %   Basophils Absolute 0.0 0 - 0.1 K/uL    Comment: Performed at [facility], [address] [place], [place] [address]    Blood Alcohol level:  Lab Results  Component Value Date   [facility] 12/30[facility]2017   ETH <5 09/29/2016     Metabolic Disorder Labs:  Lab Results  Component Value Date   HGBA1C 4.6 (L) 12/30/2017   MPG 85.32 12/30/2017   No results found for: PROLACTIN Lab Results  Component Value Date   CHOL 185 12/30/2017   TRIG 125 12/30/2017   HDL 63 12/30/2017   CHOLHDL 2.9 12/30/2017   VLDL 25 12/30/2017   LDLCALC 97 12/30/2017    Current Medications: Current Facility-Administered Medications  Medication Dose Route Frequency Provider Last Rate Last Dose  . acetaminophen (TYLENOL) tablet 650 mg  650 mg Oral Q6H PRN [name], [name], MD      . alum & mag hydroxide-simeth (MAALOX/MYLANTA) 200-200-20 MG/5ML suspension 30 mL  30 mL Oral Q4H PRN [name], [name], MD      . cloZAPine (CLOZARIL) tablet 50 mg  50 mg Oral QHS Jerrard Bradburn B, MD      . hydrOXYzine (ATARAX/VISTARIL) tablet 50 mg  50 mg Oral TID PRN [name], [name], MD   50 mg at 12/30/17 2220  . magnesium hydroxide (MILK OF MAGNESIA) suspension 30 mL  30 mL Oral Daily PRN [name], [name], MD      . OLANZapine (ZYPREXA) tablet 5 mg  5 mg Oral QHS [name], [name], MD   5 mg at 12/30/17 2205  . traZODone (DESYREL) tablet 100 mg  100 mg Oral QHS PRN [name],  [name], MD   100 mg at 12/30/17 2220   PTA Medications: Medications Prior to Admission  Medication Sig Dispense Refill Last Dose  . doxycycline (VIBRA-TABS) 100 MG tablet Take 1 tablet (100 mg total) by mouth 2 (two) times daily. (Patient not taking: Reported on 12/30/2017) 19 tablet 0 Not Taking at Unknown time  . HYDROcodone-acetaminophen (NORCO) 5-325 MG tablet Take 1 tablet by mouth every 6 (six) hours as needed. (Patient not taking: Reported on 12/30/2017) 10 tablet 0 Not Taking at Unknown time    Musculoskeletal: Strength & Muscle Tone: within normal limits Gait & Station: normal Patient leans: N/A  Psychiatric Specialty Exam: I reviewed physical examination performed in the ER and agre with the findings. Physical Exam  Nursing note and vitals reviewed. Psychiatric:  Thought content normal. His affect is blunt. His speech is delayed. He is slowed, withdrawn and actively hallucinating. Cognition and memory are normal. He expresses impulsivity.    Review of Systems  Neurological: Negative.   Psychiatric/Behavioral: Positive for hallucinations.  All other systems reviewed and are negative.   Blood pressure 124/72, pulse 84, temperature 97.6 F (36.4 C), temperature source Oral, resp. rate 16, height 6\' 2"  (1.88 m), weight 83.5 kg (184 lb), SpO2 100 %.Body mass index is 23.62 kg/m.  See SRA                                                  Sleep:  Number of Hours: 5.25    Treatment Plan Summary: Daily contact with patient to assess and evaluate symptoms and progress in treatment and Medication management   Mr. Cumpian is a 22 year old male with a history of schizophrenia admitted for psychotic break in the context of treatment noncomplinace.  #Psychosis -discontinue Zyprexa initiated in the ER -start Clozapine 50 mg nightly  #Insomnia -Trazodone 100 mg nightly  #Labs -lipid panel, TSH and A1C -EKG, refuses so far  #Disposition -discharge to home with family -follow up with [facility]   Observation Level/Precautions:  15 minute checks  Laboratory:  CBC Chemistry Profile UDS UA  Psychotherapy:    Medications:    Consultations:    Discharge Concerns:    Estimated LOS:  Other:     Physician Treatment Plan for Primary Diagnosis: Schizophrenia, undifferentiated (HCC) Long Term Goal(s): Improvement in symptoms so as ready for discharge  Short Term Goals: Ability to identify changes in lifestyle to reduce recurrence of condition will improve, Ability to verbalize feelings will improve, Ability to disclose and discuss suicidal ideas, Ability to demonstrate self-control will improve, Ability to identify and develop effective coping behaviors will improve, Ability to maintain clinical measurements within normal limits will  improve, Compliance with prescribed medications will improve and Ability to identify triggers associated with substance abuse/mental health issues will improve  Physician Treatment Plan for Secondary Diagnosis: Principal Problem:   Schizophrenia, undifferentiated (HCC) Active Problems:   Noncompliance  Long Term Goal(s): NA  Short Term Goals: NA  I certify that inpatient services furnished can reasonably be expected to improve the patient's condition.    Kristine Linea, MD 6/12/201912:54 PM

## 2017-12-31 NOTE — [facility] Suicide Risk Assessment (Addendum)
[facility] Admission Suicide Risk Assessment   Nursing information obtained from:  Patient Demographic factors:  Male, Unemployed, Adolescent or young adult, Low socioeconomic status Current Mental Status:  NA Loss Factors:  Legal issues Historical Factors:  Impulsivity Risk Reduction Factors:  Positive social support, Living with another person, especially a relative, Positive therapeutic relationship  Total Time spent with patient: 20 minutes Principal Problem: Schizophrenia, undifferentiated (HCC) Diagnosis:   Patient Active Problem List   Diagnosis Date Noted  . Schizophrenia, undifferentiated (HCC) [F20.3] 12/30/2017    Priority: High  . Noncompliance [Z91.19] 12/30/2017  . Aggressive behavior of adolescent [R46.89] 10/24/2015   Subjective Data: psychotic break  Continued Clinical Symptoms:  Alcohol Use Disorder Identification Test Final Score (AUDIT): 0 The "Alcohol Use Disorders Identification Test", Guidelines for Use in Primary Care, Second Edition.  World Science writer [facility]). Score between 0-7:  no or low risk or alcohol related problems. Score between 8-15:  moderate risk of alcohol related problems. Score between 16-19:  high risk of alcohol related problems. Score 20 or above:  warrants further diagnostic evaluation for alcohol dependence and treatment.   CLINICAL FACTORS:   Schizophrenia:   Less than 10 years old Paranoid or undifferentiated type   Musculoskeletal: Strength & Muscle Tone: within normal limits Gait & Station: normal Patient leans: N/A  Psychiatric Specialty Exam: Physical Exam  Nursing note and vitals reviewed. Psychiatric: His affect is blunt. His speech is delayed. He is withdrawn and actively hallucinating. Thought content is delusional. Cognition and memory are impaired. He expresses impulsivity.    Review of Systems  Unable to perform ROS: Mental acuity    Blood pressure 124/72, pulse 84, temperature 97.6 F (36.4 C), temperature  source Oral, resp. rate 16, height 6\' 2"  (1.88 m), weight 83.5 kg (184 lb), SpO2 100 %.Body mass index is 23.62 kg/m.  General Appearance: Casual  Eye Contact:  None  Speech:  Blocked and Slow  Volume:  Normal  Mood:  Euthymic  Affect:  Flat  Thought Process:  Goal Directed  Orientation:  Full (Time, Place, and Person)  Thought Content:  Hallucinations: Auditory  Suicidal Thoughts:  No  Homicidal Thoughts:  No  Memory:  Immediate;   Fair Recent;   Fair Remote;   Fair  Judgement:  Poor  Insight:  Lacking  Psychomotor Activity:  Psychomotor Retardation  Concentration:  Concentration: Fair and Attention Span: Fair  Recall:  [employer] of Knowledge:  Fair  Language:  Fair  Akathisia:  No  Handed:  Right  AIMS (if indicated):     Assets:  Housing Physical Health Resilience Social Support  ADL's:  Intact  Cognition:  WNL  Sleep:  Number of Hours: 5.25      COGNITIVE FEATURES THAT CONTRIBUTE TO RISK:  None    SUICIDE RISK:   Moderate:  Frequent suicidal ideation with limited intensity, and duration, some specificity in terms of plans, no associated intent, good self-control, limited dysphoria/symptomatology, some risk factors present, and identifiable protective factors, including available and accessible social support.  PLAN OF CARE: hospital admission, medication management, discharge planning.  [name] [name] is a 22 year old male with a history of schizophrenia admitted for psychotic break in the context of treatment noncomplinace.  #Psychosis -continue Zyprexa initiated in the ER -patient refuses to communicate today -attempt to start Clozapine 50 mg nightly  #Insomnia -Trazodone 100 mg nightly  #Labs -lipid panel, TSH and A1C -EKG, refuses so far  #Disposition -discharge to home with family -follow up with  [facility]  I certify that inpatient services furnished can reasonably be expected to improve the patient's condition.   [name] LineaJolanta Prairie Stenberg,  MD 12/31/2017, 12:44 PM

## 2017-12-31 NOTE — B[facility] Counselor (Signed)
CSW met with the patient to complete PSA. The patient declined to sign any forms and reports that he wants to be discharged now.   Darin Engels, MSW, Latanya Presser, [name] Clinical Social Worker 12/31/2017 10:07 AM

## 2017-12-31 NOTE — Progress Notes (Addendum)
Took over care of this patient @ 2200. Patient is found in room standing in the dark. Patient stare is menacing and patient wants writer to close the door. Patient is too disorganized to complete EKG at this time. Patient given Vistaril for anxiety and Trazodone for sleep. Will continue to monitor throughout the shift. Patient slept 5.25 hours. No apparent distress. Affect remains menacing. Continues to refuse EKG. Last night said "in the morning" and now says "after breakfast." Will endorse care to oncoming shift.

## 2017-12-31 NOTE — Progress Notes (Signed)
Recreation Therapy Notes  INPATIENT RECREATION THERAPY ASSESSMENT  Patient Details Name: [name] [name] MRN: [id number] DOB: [birth year] Today's Date: 12/31/2017   Patient would not complete assessment       Information Obtained From:    Able to Participate in Assessment/Interview:    Patient Presentation:    Reason for Admission (Per Patient):    Patient Stressors:    Coping Skills:      Leisure Interests (2+):     Frequency of Recreation/Participation:    Awareness of Community Resources:     [employer] Resources:     Current Use:    If no, Barriers?:    Expressed Interest in [employer]Community Resource Information:    [place]     Patient Main Form of Transportation:    Patient Strengths:     Patient Identified Areas of Improvement:     Patient Goal for Hospitalization:     Current SI (including self-harm):     Current HI:     Current AVH:    Staff Intervention Plan:    Consent to Intern Participation:    Anntoinette Haefele 12/31/2017, 2:08 PM

## 2017-12-31 NOTE — [facility] Group Notes (Signed)
LCSW Group Therapy Note  12/31/2017 1:00 pm  Type of Therapy/Topic:  Group Therapy:  Emotion Regulation  Participation Level:  Did Not Attend   Description of Group:    The purpose of this group is to assist patients in learning to regulate negative emotions and experience positive emotions. Patients will be guided to discuss ways in which they have been vulnerable to their negative emotions. These vulnerabilities will be juxtaposed with experiences of positive emotions or situations, and patients will be challenged to use positive emotions to combat negative ones. Special emphasis will be placed on coping with negative emotions in conflict situations, and patients will process healthy conflict resolution skills.  Therapeutic Goals: 1. Patient will identify two positive emotions or experiences to reflect on in order to balance out negative emotions 2. Patient will label two or more emotions that they find the most difficult to experience 3. Patient will demonstrate positive conflict resolution skills through discussion and/or role plays  Summary of Patient Progress:  [name] [name] was invited to today's group, but chose not to attend.     Therapeutic Modalities:   Cognitive Behavioral Therapy Feelings Identification Dialectical Behavioral Therapy

## 2017-12-31 NOTE — [facility] Suicide Risk Assessment (Signed)
[facility] INPATIENT:  Family/Significant Other Suicide Prevention Education  Suicide Prevention Education:  Patient Refusal for Family/Significant Other Suicide Prevention Education: The patient [name] [name] has refused to provide written consent for family/significant other to be provided Family/Significant Other Suicide Prevention Education during admission and/or prior to discharge.  Physician notified.  Johny ShearsCassandra  Ajane Novella 12/31/2017, 10:06 AM

## 2017-12-31 NOTE — Progress Notes (Signed)
D:Patient  only came out of room this am shift  for breakfast  this shift .  Uncooperative with  lab draws refusing to come out of room . Patient later allowed staff to draw labs and do EKG . Isolative, Patient not applying self to unit programing . No goals met     Denies suicidal ideations and auditory hallucinations  No pain concerns .   A: Encourage patient participation with unit programming . Instruction  Given on  Medication , verbalize understanding.  R: Voice no other concerns. Staff continue to monitor

## 2017-12-31 NOTE — Tx Team (Addendum)
Interdisciplinary Treatment and Diagnostic Plan Update  12/31/2017 Time of Session: 10:56am [name] [name] MRN: [id number]  Principal Diagnosis: Schizophrenia, undifferentiated (HCC)  Secondary Diagnoses: Principal Problem:   Schizophrenia, undifferentiated (HCC) Active Problems:   Noncompliance   Current Medications:  Current Facility-Administered Medications  Medication Dose Route Frequency Provider Last Rate Last Dose  . acetaminophen (TYLENOL) tablet 650 mg  650 mg Oral Q6H PRN [name], [name], MD      . alum & mag hydroxide-simeth (MAALOX/MYLANTA) 200-200-20 MG/5ML suspension 30 mL  30 mL Oral Q4H PRN [name], [name], MD      . cloZAPine (CLOZARIL) tablet 50 mg  50 mg Oral QHS [name], [name], MD      . hydrOXYzine (ATARAX/VISTARIL) tablet 50 mg  50 mg Oral TID PRN [name], [name], MD   50 mg at 12/30/17 2220  . magnesium hydroxide (MILK OF MAGNESIA) suspension 30 mL  30 mL Oral Daily PRN [name], [name], MD      . OLANZapine (ZYPREXA) tablet 5 mg  5 mg Oral QHS [name], [name], MD   5 mg at 12/30/17 2205  . traZODone (DESYREL) tablet 100 mg  100 mg Oral QHS PRN [name], [name], MD   100 mg at 12/30/17 2220   PTA Medications: Medications Prior to Admission  Medication Sig Dispense Refill Last Dose  . doxycycline (VIBRA-TABS) 100 MG tablet Take 1 tablet (100 mg total) by mouth 2 (two) times daily. (Patient not taking: Reported on 12/30/2017) 19 tablet 0 Not Taking at Unknown time  . HYDROcodone-acetaminophen (NORCO) 5-325 MG tablet Take 1 tablet by mouth every 6 (six) hours as needed. (Patient not taking: Reported on 12/30/2017) 10 tablet 0 Not Taking at Unknown time    Patient Stressors: Medication change or noncompliance  Patient Strengths: Capable of independent living Communication skills Physical Health Supportive family/friends  Treatment Modalities: Medication Management, Group therapy, Case management,  1 to 1 session with clinician, Psychoeducation,  Recreational therapy.   Physician Treatment Plan for Primary Diagnosis: Schizophrenia, undifferentiated (HCC) Long Term Goal(s):     Short Term Goals:    Medication Management: Evaluate patient's response, side effects, and tolerance of medication regimen.  Therapeutic Interventions: 1 to 1 sessions, Unit Group sessions and Medication administration.  Evaluation of Outcomes: Progressing  Physician Treatment Plan for Secondary Diagnosis: Principal Problem:   Schizophrenia, undifferentiated (HCC) Active Problems:   Noncompliance  Long Term Goal(s):     Short Term Goals:       Medication Management: Evaluate patient's response, side effects, and tolerance of medication regimen.  Therapeutic Interventions: 1 to 1 sessions, Unit Group sessions and Medication administration.  Evaluation of Outcomes: Progressing   RN Treatment Plan for Primary Diagnosis: Schizophrenia, undifferentiated (HCC) Long Term Goal(s): Knowledge of disease and therapeutic regimen to maintain health will improve  Short Term Goals: Ability to verbalize feelings will improve, Ability to identify and develop effective coping behaviors will improve and Compliance with prescribed medications will improve  Medication Management: RN will administer medications as ordered by provider, will assess and evaluate patient's response and provide education to patient for prescribed medication. RN will report any adverse and/or side effects to prescribing provider.  Therapeutic Interventions: 1 on 1 counseling sessions, Psychoeducation, Medication administration, Evaluate responses to treatment, Monitor vital signs and CBGs as ordered, Perform/monitor CIWA, COWS, AIMS and Fall Risk screenings as ordered, Perform wound care treatments as ordered.  Evaluation of Outcomes: Progressing   LCSW Treatment Plan for Primary Diagnosis: Schizophrenia,  undifferentiated (HCC) Long Term Goal(s): Safe transition to appropriate next level  of care at discharge, Engage patient in therapeutic group addressing interpersonal concerns.  Short Term Goals: Engage patient in aftercare planning with referrals and resources, Increase social support, Facilitate acceptance of mental health diagnosis and concerns, Identify triggers associated with mental health/substance abuse issues and Increase skills for wellness and recovery  Therapeutic Interventions: Assess for all discharge needs, 1 to 1 time with Social worker, Explore available resources and support systems, Assess for adequacy in community support network, Educate family and significant other(s) on suicide prevention, Complete Psychosocial Assessment, Interpersonal group therapy.  Evaluation of Outcomes: Progressing   Progress in Treatment: Attending groups: No. Participating in groups: No. Taking medication as prescribed: Yes. Toleration medication: Yes. Family/Significant other contact made: No, will contact:  Patient refused Patient understands diagnosis: Yes. Discussing patient identified problems/goals with staff: Yes. Medical problems stabilized or resolved: Yes. Denies suicidal/homicidal ideation: Yes. Issues/concerns per patient self-inventory: No. Other:   New problem(s) identified: No, Describe:  None  New Short Term/Long Term Goal(s): *Patient did not attend.*  Patient Goals:  *Patient did not attend.*  Discharge Plan or Barriers: To return home and follow up with outpatient treatment.  Reason for Continuation of Hospitalization: Medication stabilization  Estimated Length of Stay: 5-7 days  Recreational Therapy: Patient Stressors: N/A  Patient Goal: Patient will engage in interactions with peers and staff in pro-social manner at least 2x within 5 recreation therapy group sessions  Attendees: Patient: 12/31/2017 11:26 AM  Physician: Dr. Jennet Maduro, MD 12/31/2017 11:26 AM  Nursing: Hulan Amato, RN 12/31/2017 11:26 AM  RN Care Manager: 12/31/2017 11:26 AM   Social Worker: [name], LCSWA 12/31/2017 11:26 AM  Recreational Therapist: Danella Deis. Dreama Saa, LRT 12/31/2017 11:26 AM  Other: Heidi Dach, LCSW 12/31/2017 11:26 AM  Other: Huey Romans, LCSW 12/31/2017 11:26 AM  Other: 12/31/2017 11:26 AM    Scribe for Treatment Team: [name], LCSW 12/31/2017 11:26 AM

## 2017-12-31 NOTE — Progress Notes (Signed)
Clozapine monitoring Consult   22 yo male ordered clozapine 50 mg PO at bedtime.  New restart.  Patient has been off meds for a while.   06/12 [facility] 2100  Information entered into Clozapine registry and pt eligible to receive clozapine Next labs due in a week - ordered for 06/19  Pharmacy will continue to follow.   [place] LabsKaren K Anetta Olvera, RPh 12/31/2017 8:50 AM

## 2017-12-31 NOTE — Progress Notes (Signed)
Recreation Therapy Notes  Date: 12/31/2017  Time: 9:30 am   Location: Craft Room   Behavioral response: N/A   Intervention Topic:  Communication  Discussion/Intervention: Patient did not attend group.   Clinical Observations/Feedback:  Patient did not attend group.   [name] [name] LRT/CTRS        [name] 12/31/2017 11:23 AM 

## 2017-12-31 NOTE — BHH Counselor (Signed)
Adult Comprehensive Assessment  Patient ID: [name] [name], male   DOB: [birth year], 22 y.o.   MRN: [id number]  Information Source: Information source: Patient  Current Stressors:  Patient states their primary concerns and needs for treatment are:: "Problems at home" Patient states their goals for this hospitilization and ongoing recovery are:: Pt. reports "nothing really" Educational / Learning stressors: None reported Employment / Job issues: Unemployed Family Relationships: Pt. mentions issues with his mother but will not go into detail about what issues there are. Financial / Lack of resources (include bankruptcy): None reported Housing / Lack of housing: Lives with his mother and brother Physical health (include injuries & life threatening diseases): None reported Social relationships: None reported Substance abuse: None reported Bereavement / Loss: None reported  Living/Environment/Situation:  Living Arrangements: Parent Living conditions (as described by patient or guardian): Pt. lives with his mother Who else lives in the home?: Mother and little brother How long has patient lived in current situation?: All of his life What is atmosphere in current home: Chaotic  Family History:  Marital status: Single Are you sexually active?: No What is your sexual orientation?: Heterosexual Has your sexual activity been affected by drugs, alcohol, medication, or emotional stress?: N/A Does patient have children?: No  Childhood History:  By whom was/is the patient raised?: Mother Additional childhood history information: Pt. reports that he doesnt have a relationship with his father. Description of patient's relationship with caregiver when they were a child: Patient report that the relationship was "nothing special" Patient's description of current relationship with people who raised him/her: Pt. reports "It's bad" Does patient have siblings?: Yes Number of Siblings: 1 Description  of patient's current relationship with siblings: "Complicated" Did patient suffer any verbal/emotional/physical/sexual abuse as a child?: No Did patient suffer from severe childhood neglect?: No Has patient ever been sexually abused/assaulted/raped as an adolescent or adult?: No Was the patient ever a victim of a crime or a disaster?: No Witnessed domestic violence?: No Has patient been effected by domestic violence as an adult?: No  Education:  Highest grade of school patient has completed: GED Currently a Consulting civil engineerstudent?: No Learning disability?: No  Employment/Work Situation:   Employment situation: Unemployed Patient's job has been impacted by current illness: No What is the longest time patient has a held a job?: 1 year Where was the patient employed at that time?: Pt. [employer] that he cannot remember. Did You Receive Any Psychiatric Treatment/Services While in the Military?: No Are There Guns or Other Weapons in Your Home?: No  Financial Resources:   Financial resources: Support from parents / caregiver Does patient have a Lawyerrepresentative payee or guardian?: No  Alcohol/Substance Abuse:   What has been your use of drugs/alcohol within the last 12 months?: Pt. denies any use  Social Support System:   Forensic psychologistatient's Community Support System: Poor Describe Community Support System: Pt. reports his mother, brother and family. Type of faith/religion: Ephriam KnucklesChristian How does patient's faith help to cope with current illness?: "Not sure it does"  Leisure/Recreation:   Leisure and Hobbies: Walking  Strengths/Needs:   What is the patient's perception of their strengths?: None Patient states they can use these personal strengths during their treatment to contribute to their recovery: N/A Patient states these barriers may affect/interfere with their treatment: N/A Patient states these barriers may affect their return to the community: None Other important information patient would like considered in  planning for their treatment: N/A  Discharge Plan:   Currently receiving community mental health  services: Yes (From Whom) Patient states concerns and preferences for aftercare planning are: Chart reports that he recieves services at [facility] Patient states they will know when they are safe and ready for discharge when: "I'm ready to go now. I think that if I say that I can go, I should be able to go." Does patient have access to transportation?: No Does patient have financial barriers related to discharge medications?: Yes Patient description of barriers related to discharge medications: None Plan for no access to transportation at discharge: CSW will access for transportation means Plan for living situation after discharge: Pt. reports that he will be going to his friends house at discharge. Will patient be returning to same living situation after discharge?: No  Summary/Recommendations:   Summary and Recommendations (to be completed by the evaluator): Patient is a 22 year old African American male admitted involuntarily due to his mother having some concerns regarding his mental state. Patient lives with his mother and little brother in [place]. Patient denies any alcohol or substance use. His UDS was negative for all substances. His affect was constricted and blunt. The patient declined to sign any forms and reports that he wants to be discharged now. At discharge, patient reports that he will go to his friend's house in [place] and be encouraged to follow up with his provider at the [facility]. While here, patient will benefit from crisis stabilization, medication evaluation, group therapy and psychoeducation, in addition to case management for discharge planning. At discharge, it is recommended that patient remain compliant with the established discharge plan and continue treatment.   [name] [name]. 12/31/2017

## 2017-12-31 NOTE — Plan of Care (Signed)
Patient  only came out of room this am shift  for breakfast  this shift .  Uncooperative with  lab draws refusing to come out of room . Patient later allowed staff to draw labs and do EKG . Isolative, Patient not applying self to unit programing . No goals met     Problem: Education: Goal: Knowledge of [place] [facility] Education information/materials will improve Outcome: Not Progressing Goal: Emotional status will improve Outcome: Not Progressing Goal: Mental status will improve Outcome: Not Progressing Goal: Verbalization of understanding the information provided will improve Outcome: Not Progressing   Problem: Coping: Goal: Ability to verbalize frustrations and anger appropriately will improve Outcome: Not Progressing Goal: Ability to demonstrate self-control will improve Outcome: Not Progressing   Problem: Health Behavior/Discharge Planning: Goal: Identification of resources available to assist in meeting health care needs will improve Outcome: Not Progressing Goal: Compliance with treatment plan for underlying cause of condition will improve Outcome: Not Progressing   Problem: Elimination: Goal: Will not experience complications related to bowel motility Outcome: Not Progressing

## 2018-01-01 MED ORDER — OLANZAPINE 10 MG PO TABS: 10.0000 mg | ORAL_TABLET | Freq: Every day | ORAL | Status: DC

## 2018-01-01 MED ORDER — OLANZAPINE 5 MG PO TABS
15.0000 mg | ORAL_TABLET | Freq: Every day | ORAL | Status: DC
  Administered 2018-01-01: 15 mg via ORAL
  Filled 2018-01-01 (×2): qty 1

## 2018-01-01 MED ORDER — CLOZAPINE 100 MG PO TABS
100.0000 mg | ORAL_TABLET | Freq: Every day | ORAL | Status: DC
  Administered 2018-01-02 – 2018-01-04 (×2): 100 mg via ORAL
  Filled 2018-01-01 (×2): qty 1

## 2018-01-01 MED ORDER — CLOZAPINE 25 MG PO TABS
50.0000 mg | ORAL_TABLET | Freq: Every day | ORAL | Status: AC
  Administered 2018-01-01: 50 mg via ORAL
  Filled 2018-01-01: qty 2

## 2018-01-01 NOTE — BHH Group Notes (Signed)
LCSW Group Therapy Note 01/01/2018 9:00AM  Type of Therapy and Topic:  Group Therapy:  Setting Goals  Participation Level:  Did Not Attend  Description of Group: In this process group, patients discussed using strengths to work toward goals and address challenges.  Patients identified two positive things about themselves and one goal they were working on.  Patients were given the opportunity to share openly and support each other's plan for self-empowerment.  The group discussed the value of gratitude and were encouraged to have a daily reflection of positive characteristics or circumstances.  Patients were encouraged to identify a plan to utilize their strengths to work on current challenges and goals.  Therapeutic Goals 1. Patient will verbalize personal strengths/positive qualities and relate how these can assist with achieving desired personal goals 2. Patients will verbalize affirmation of peers plans for personal change and goal setting 3. Patients will explore the value of gratitude and positive focus as related to successful achievement of goals 4. Patients will verbalize a plan for regular reinforcement of personal positive qualities and circumstances.  Summary of Patient Progress:  [name] [name] was invited to today's group, but chose not to attend.     Therapeutic Modalities Cognitive Behavioral Therapy Motivational Interviewing    [name] [name], [place] 01/01/2018 11:29 AM

## 2018-01-01 NOTE — Progress Notes (Signed)
Recreation Therapy Notes  Date: 01/01/2018  Time: 9:30 am  Location: Room 21  Behavioral response: Appropriate, Restless  Intervention Topic: Animal Assisted Therapy  Discussion/Intervention:  Patient participated in Animal Assisted Therapy during group today. Group facilitator defined Animal Assisted Therapy as the use of animals as a therapeutic tool to assist a person in restoring balance to their life.  The group facilitator also described the benefits of Animal Assisted Therapy as improving patients' mental, physical, social and emotional functioning with the aid of animals; depending on the needs of the patient. Individuals in the group were able to pet the dogs as well as ask questions.  Clinical Observations/Feedback:  Patient came to group and was focused on what peers and staff had to say about the topic at hand. Individual was social with peers and staff while stay on topic during group. Participant was engaged with the dogs during group. Patient came to group late and  walked in and out of group several times due to unknown reasons. [name] [name] LRT/CTRS         [name] 01/01/2018 11:00 AM

## 2018-01-01 NOTE — Progress Notes (Addendum)
[facility]  01/01/2018 9:57 AM [name] [name]  MRN:  [id number]  Subjective:   [name] is up and out of his room this morning. He, however, is very psychotic. Paranoid, not signing any forms. Delusional about his illness "I don't think I need medications anymore". Hallucinating, he has been looking around my office during the conversation and giggling inapropriately. Denies any symptoms whatsoever on direct questioning. He did tale Clozapine 50 mg last night.  Attempted to call the mother but she is at work until [employer]6PM, spoke briefly with 549 year old sister. There is the wrong phone number for the family on the chart. The number is [phone].  Principal Problem: Schizophrenia, undifferentiated (HCC) Diagnosis:   Patient Active Problem List   Diagnosis Date Noted  . Schizophrenia, undifferentiated (HCC) [F20.3] 12/30/2017    Priority: High  . Noncompliance [Z91.19] 12/30/2017  . Aggressive behavior of adolescent [R46.89] 10/24/2015   Total Time spent with patient: 20 minutes  Past Psychiatric History: schizophrenia  Past Medical History:  Past Medical History:  Diagnosis Date  . Asthma   . Depression   . Psychosis [facility](HCC)     Past Surgical History:  Procedure Laterality Date  . BACK SURGERY     Family History: History reviewed. No pertinent family history. Family Psychiatric  History: none. Social History:  Social History   Substance and Sexual Activity  Alcohol Use No     Social History   Substance and Sexual Activity  Drug Use Yes  . Types: Marijuana    Social History   Socioeconomic History  . Marital status: Single    Spouse name: Not on file  . Number of children: Not on file  . Years of education: Not on file  . Highest education level: Not on file  Occupational History  . Not on file  Social Needs  . Financial resource strain: Not on file  . Food insecurity:    Worry: Not on file    Inability: Not on file  . Transportation needs:   Medical: Not on file    Non-medical: Not on file  Tobacco Use  . Smoking status: Never Smoker  . Smokeless tobacco: Never Used  Substance and Sexual Activity  . Alcohol use: No  . Drug use: Yes    Types: Marijuana  . Sexual activity: Not Currently  Lifestyle  . Physical activity:    Days per week: Not on file    Minutes per session: Not on file  . Stress: Not on file  Relationships  . Social connections:    Talks on phone: Not on file    Gets together: Not on file    Attends religious service: Not on file    Active member of club or organization: Not on file    Attends meetings of clubs or organizations: Not on file    Relationship status: Not on file  Other Topics Concern  . Not on file  Social History Narrative  . Not on file   Additional Social History:                         Sleep: Fair  Appetite:  Poor  Current Medications: Current Facility-Administered Medications  Medication Dose Route Frequency Provider Last Rate Last Dose  . acetaminophen (TYLENOL) tablet 650 mg  650 mg Oral Q6H PRN [name], [name], MD      . alum & mag hydroxide-simeth (MAALOX/MYLANTA) 200-200-20 MG/5ML suspension 30 mL  30  mL Oral Q4H PRN [name], [name], MD      . Melene Muller ON 01/02/2018] cloZAPine (CLOZARIL) tablet 100 mg  100 mg Oral QHS Mychal Decarlo B, MD      . cloZAPine (CLOZARIL) tablet 50 mg  50 mg Oral QHS Norm Wray B, MD      . hydrOXYzine (ATARAX/VISTARIL) tablet 50 mg  50 mg Oral TID PRN [name], [name], MD   50 mg at 12/30/17 2220  . magnesium hydroxide (MILK OF MAGNESIA) suspension 30 mL  30 mL Oral Daily PRN [name], [name], MD      . OLANZapine (ZYPREXA) tablet 10 mg  10 mg Oral QHS Fernando Stoiber B, MD      . traZODone (DESYREL) tablet 100 mg  100 mg Oral QHS PRN [name], [name], MD   100 mg at 12/31/17 2056    Lab Results:  Results for orders placed or performed during the hospital encounter of 12/30/17 (from the past 48 hour(s))  CBC with  Differential/Platelet     Status: None   Collection Time: 12/31/17  9:20 AM  Result Value Ref Range   WBC 4.3 3.8 - 10.6 K/uL   RBC 5.03 4.40 - 5.90 MIL/uL   Hemoglobin 14.8 13.0 - 18.0 g/dL   HCT 16.1 09.6 - 04.5 %   MCV 83.5 80.0 - 100.0 fL   MCH 29.4 26.0 - 34.0 pg   MCHC 35.2 32.0 - 36.0 g/dL   RDW 40.9 81.1 - 91.4 %   Platelets 234 150 - 440 K/uL   Neutrophils Relative % 50 %   Neutro Abs 2.1 1.4 - 6.5 K/uL   Lymphocytes Relative 42 %   Lymphs Abs 1.8 1.0 - 3.6 K/uL   Monocytes Relative 7 %   Monocytes Absolute 0.3 0.2 - 1.0 K/uL   Eosinophils Relative 0 %   Eosinophils Absolute 0.0 0 - 0.7 K/uL   Basophils Relative 1 %   Basophils Absolute 0.0 0 - 0.1 K/uL    Comment: Performed at [facility], [address] [place], [place] [address]    Blood Alcohol level:  Lab Results  Component Value Date   [facility] <10 12/30/2017   ETH <5 09/29/2016    Metabolic Disorder Labs: Lab Results  Component Value Date   HGBA1C 4.6 (L) 12/30/2017   MPG 85.32 12/30/2017   No results found for: PROLACTIN Lab Results  Component Value Date   CHOL 185 12/30/2017   TRIG 125 12/30/2017   HDL 63 12/30/2017   CHOLHDL 2.9 12/30/2017   VLDL 25 12/30/2017   LDLCALC 97 12/30/2017    Physical Findings: AIMS:  , ,  ,  ,    CIWA:    COWS:     Musculoskeletal: Strength & Muscle Tone: within normal limits Gait & Station: normal Patient leans: N/A  Psychiatric Specialty Exam: Physical Exam  Nursing note and vitals reviewed. Psychiatric: His affect is blunt and inappropriate. His speech is delayed. He is slowed, withdrawn and actively hallucinating. Thought content is delusional. Cognition and memory are normal. He expresses impulsivity.    Review of Systems  Neurological: Negative.   Psychiatric/Behavioral: Positive for hallucinations.  All other systems reviewed and are negative.   Blood pressure 121/78, pulse 69, temperature 97.8 F (36.6 C), temperature source Oral,  resp. rate 18, height 6\' 2"  (1.88 m), weight 83.5 kg (184 lb), SpO2 98 %.Body mass index is 23.62 kg/m.  General Appearance: Casual  Eye Contact:  Poor  Speech:  Clear and Coherent  Volume:  Normal  Mood:  Euthymic  Affect:  Inappropriate  Thought Process:  Goal Directed and Descriptions of Associations: Tangential  Orientation:  Full (Time, Place, and Person)  Thought Content:  Illogical, Delusions, Hallucinations: Auditory and Paranoid Ideation  Suicidal Thoughts:  No  Homicidal Thoughts:  No  Memory:  Immediate;   Fair Recent;   Fair Remote;   Fair  Judgement:  Poor  Insight:  Lacking  Psychomotor Activity:  Psychomotor Retardation  Concentration:  Concentration: Fair and Attention Span: Fair  Recall:  [employer] of Knowledge:  Fair  Language:  Fair  Akathisia:  No  Handed:  Right  AIMS (if indicated):     Assets:  Communication Skills Desire for Improvement Financial Resources/Insurance Housing Physical Health Resilience Social Support  ADL's:  Intact  Cognition:  WNL  Sleep:  Number of Hours: 6     Treatment Plan Summary: Daily contact with patient to assess and evaluate symptoms and progress in treatment and Medication management   Mr. Hohensee is a 22 year old male with a history of schizophrenia admitted for psychotic break in the context of treatment noncomplinace.  #Psychosis, still floridly psychotic -increase Zyprexa that was initiated in the ER to 10 mg nightly, even though the mother believes that only Clozapine is helpful, we observe that the patient has been slightly better. Clozapine is slow to "kick in" and we will have to titrate slowly in fear of clozapine-induced myocarditis/cardiomyopathy most frequently afflicting young males. We realize that Zyprexa is not the best choice to combine with Clozapine but there are allergies to some antipsychotics -continue Clozapine titration 50 mg tonight, 100 mg for the following two days  #Insomnia, improved  with treatment -Trazodone 100 mg nightly  #Labs -lipid panel, TSH and A1C are unremerkable -EKG reviewed, NSR with QTc 374  #Disposition -discharge to home with family -follow up with [facility] or ACT team -needs information on [facility] program    Kristine Linea, MD 01/01/2018, 9:57 AM

## 2018-01-01 NOTE — BHH Counselor (Signed)
CSW met with the patient to sign consent forms for aftercare and family contact. The patient declined to sign any forms and reports that he is an adult and doesn't want to sign anything.  Darin Engels, MSW, Latanya Presser, [name] Clinical Social Worker 01/01/2018 10:20 AM

## 2018-01-01 NOTE — Plan of Care (Signed)
D: Patient denies SI/HI/AVH. Patient verbally contracts for safety. Patient is calm, cooperative and pleasant. Patient is flat in affect. Patient denies depression, anxiety, and hopelessness. Patient is seen in milieu, patient isolates to self and is often pacing in hallway. Patient has no complaints at this time.  A: Patient was assessed by this nurse. Patient was oriented to unit. Patient's safety was maintained on unit. Q x 15 minute observation checks were completed for safety. Patient care plan was reviewed. Patient was offered support and encouragement. Patient was encourage to attend groups, participate in unit activities and continue with plan of care.    R: Patient has no complaints of pain at this time. Patient safety is maintained.   Problem: Education: Goal: Knowledge of  General Education information/materials will improve Outcome: Progressing   Problem: Elimination: Goal: Will not experience complications related to bowel motility Outcome: Progressing

## 2018-01-01 NOTE — Plan of Care (Addendum)
Patient found in common area upon my arrival. Patient is visible but not social this evening. Patient immediately complained that he did not eat dinner and was hungry. Patient provided sandwich tray and drink. Patient is guarded and minimally verbal when attempting to assess. Patient's affect remains glaring and intense. Patient paced the unit most of the evening and did not interact with peers. Patient only spoke when he wanted something. Denies SI/HI/AVH. But may be responding to internal stimuli as he is preoccupied and distant. No responding was observed by Clinical research associatewriter. Processing impaired. No evidence of learning observed when administering medications. Appetite is good. Denies pain. Given Trazodone for sleep which was eventually helpful but patient remained restless and pacing for 2 more hours after taking it. Compliant with HS medications including Clozaril. Q 15 minute checks maintained. Will continue to monitor throughout the shift. Patient slept 6 hours. No apparent distress. Will endorse care to oncoming shift.  Problem: Education: Goal: Mental status will improve Outcome: Progressing   Problem: Coping: Goal: Ability to demonstrate self-control will improve Outcome: Progressing   Problem: Education: Goal: Knowledge of [place] [facility] Education information/materials will improve Outcome: Not Progressing Goal: Emotional status will improve Outcome: Not Progressing

## 2018-01-01 NOTE — Progress Notes (Signed)
   01/01/18 1445  Clinical Encounter Type  Visited With Patient  Visit Type Initial  Referral From Patient  Consult/Referral To Chaplain  Spiritual Encounters  Spiritual Needs Emotional;Other (Comment)   CH was visiting [facility] during recreation time when [facility]. CH secured a consult RM and talked with PT about his concern of not having clothes. [facility] informed [facility] with staff about the need for [facility]. PT also stated that he had a argument with his parents and will live with friends [facility]. CH will follow up tomorrow 01/02/18.

## 2018-01-01 NOTE — B[facility] Group Notes (Signed)
  01/01/2018  Time: 1PM  Type of Therapy/Topic:  Group Therapy:  Balance in Life  Participation Level:  None  Description of Group:   This group will address the concept of balance and how it feels and looks when one is unbalanced. Patients will be encouraged to process areas in their lives that are out of balance and identify reasons for remaining unbalanced. Facilitators will guide patients in utilizing problem-solving interventions to address and correct the stressor making their life unbalanced. Understanding and applying boundaries will be explored and addressed for obtaining and maintaining a balanced life. Patients will be encouraged to explore ways to assertively make their unbalanced needs known to significant others in their lives, using other group members and facilitator for support and feedback.  Therapeutic Goals: 1. Patient will identify two or more emotions or situations they have that consume much of in their lives. 2. Patient will identify signs/triggers that life has become out of balance:  3. Patient will identify two ways to set boundaries in order to achieve balance in their lives:  4. Patient will demonstrate ability to communicate their needs through discussion and/or role plays  Summary of Patient Progress: Pt attended group but did not participate. Pt was observed walking around the group room, and was not able to accept redirection. Pt was walking in and out of group until asked by  CSW to either stay in or out of group room. Pt chose to stay in group room, but did not participate when prompted by CSW.    Therapeutic Modalities:   Cognitive Behavioral Therapy Solution-Focused Therapy Assertiveness Training  Heidi DachKelsey Buford Gayler, MSW, LCSW Clinical Social Worker 01/01/2018 1:59 PM

## 2018-01-02 MED ORDER — OLANZAPINE 10 MG IM SOLR
10.0000 mg | Freq: Every day | INTRAMUSCULAR | Status: DC
  Filled 2018-01-02 (×2): qty 10

## 2018-01-02 MED ORDER — LORAZEPAM 2 MG PO TABS
2.0000 mg | ORAL_TABLET | Freq: Once | ORAL | Status: AC
  Administered 2018-01-02: 2 mg via ORAL
  Filled 2018-01-02: qty 1

## 2018-01-02 MED ORDER — OLANZAPINE 5 MG PO TBDP
20.0000 mg | ORAL_TABLET | Freq: Every day | ORAL | Status: DC
  Administered 2018-01-02 – 2018-01-13 (×11): 20 mg via ORAL
  Filled 2018-01-02 (×14): qty 4

## 2018-01-02 NOTE — BHH Group Notes (Signed)
B[facility] Group Notes:  (Nursing/MHT/Case Management/Adjunct)  Date:  01/02/2018  Time:  9:21 PM  Type of Therapy:  Group Therapy  Participation Level:  Minimal  Participation Quality:  Inattentive  Affect:  Flat  Cognitive:  Disorganized  Insight:  Limited  Engagement in Group:  Lacking and Poor  Modes of Intervention:  Discussion  Summary of Progress/Problems: [name] [name] shared with the group that he did not have a goal. [name] [name] shared he was bored and wanted something to do and would try to find something to do later. [name] [name] walked in and out throughout the group. Jinger NeighborsKeith D Maddi Collar 01/02/2018, 9:21 PM

## 2018-01-02 NOTE — Progress Notes (Signed)
[facility]  01/02/2018 12:32 PM Rudy Crist Infante Spaugh  MRN:  [id number]  Subjective:   Mr. Hulan AmatoMustafa is in bed this morning. He missed lunch but is now ready to eat. He took his evening medications with outmost encouragement. He spoke with the chaplain last night. He needs clothes. He is more talkative in the afternoon but denies any symptoms. He does appear to attend to internal stimuli and is still paranoid. I will order injectable Zyprexa, jus in case patient refuses medications.   Spoke with the SW about clothes.  Spoke extensively with the mother, who is very supportive and concerned. The patient does not have a "friend" to live with following discharge.  Principal Problem: Schizophrenia, undifferentiated (HCC) Diagnosis:   Patient Active Problem List   Diagnosis Date Noted  . Schizophrenia, undifferentiated (HCC) [F20.3] 12/30/2017    Priority: High  . Noncompliance [Z91.19] 12/30/2017  . Aggressive behavior of adolescent [R46.89] 10/24/2015   Total Time spent with patient: 20 minutes  Past Psychiatric History: schizophrenia  Past Medical History:  Past Medical History:  Diagnosis Date  . Asthma   . Depression   . Psychosis [facility](HCC)     Past Surgical History:  Procedure Laterality Date  . BACK SURGERY     Family History: History reviewed. No pertinent family history. Family Psychiatric  History: none Social History:  Social History   Substance and Sexual Activity  Alcohol Use No     Social History   Substance and Sexual Activity  Drug Use Yes  . Types: Marijuana    Social History   Socioeconomic History  . Marital status: Single    Spouse name: Not on file  . Number of children: Not on file  . Years of education: Not on file  . Highest education level: Not on file  Occupational History  . Not on file  Social Needs  . Financial resource strain: Not on file  . Food insecurity:    Worry: Not on file    Inability: Not on file  . Transportation needs:     Medical: Not on file    Non-medical: Not on file  Tobacco Use  . Smoking status: Never Smoker  . Smokeless tobacco: Never Used  Substance and Sexual Activity  . Alcohol use: No  . Drug use: Yes    Types: Marijuana  . Sexual activity: Not Currently  Lifestyle  . Physical activity:    Days per week: Not on file    Minutes per session: Not on file  . Stress: Not on file  Relationships  . Social connections:    Talks on phone: Not on file    Gets together: Not on file    Attends religious service: Not on file    Active member of club or organization: Not on file    Attends meetings of clubs or organizations: Not on file    Relationship status: Not on file  Other Topics Concern  . Not on file  Social History Narrative  . Not on file   Additional Social History:                         Sleep: Fair  Appetite:  Poor  Current Medications: Current Facility-Administered Medications  Medication Dose Route Frequency Provider Last Rate Last Dose  . acetaminophen (TYLENOL) tablet 650 mg  650 mg Oral Q6H PRN [name], [name], MD      . alum & mag hydroxide-simeth (MAALOX/MYLANTA)  200-200-20 MG/5ML suspension 30 mL  30 mL Oral Q4H PRN [name], [name], MD      . cloZAPine (CLOZARIL) tablet 100 mg  100 mg Oral QHS Ary Lavine B, MD      . hydrOXYzine (ATARAX/VISTARIL) tablet 50 mg  50 mg Oral TID PRN [name], [name], MD   50 mg at 12/30/17 2220  . magnesium hydroxide (MILK OF MAGNESIA) suspension 30 mL  30 mL Oral Daily PRN [name], [name], MD      . OLANZapine (ZYPREXA) tablet 15 mg  15 mg Oral QHS Maekayla Giorgio B, MD   15 mg at 01/01/18 2219  . traZODone (DESYREL) tablet 100 mg  100 mg Oral QHS PRN [name], [name], MD   100 mg at 12/31/17 2056    Lab Results: No results found for this or any previous visit (from the past 48 hour(s)).  Blood Alcohol level:  Lab Results  Component Value Date   ETH <10 12/30/2017   ETH <5 09/29/2016    Metabolic Disorder  Labs: Lab Results  Component Value Date   HGBA1C 4.6 (L) 12/30/2017   MPG 85.32 12/30/2017   No results found for: PROLACTIN Lab Results  Component Value Date   CHOL 185 12/30/2017   TRIG 125 12/30/2017   HDL 63 12/30/2017   CHOLHDL 2.9 12/30/2017   VLDL 25 12/30/2017   LDLCALC 97 12/30/2017    Physical Findings: AIMS:  , ,  ,  ,    CIWA:    COWS:     Musculoskeletal: Strength & Muscle Tone: within normal limits Gait & Station: normal Patient leans: N/A  Psychiatric Specialty Exam: Physical Exam  Nursing note and vitals reviewed. Psychiatric: His affect is blunt. His speech is delayed. He is slowed, withdrawn and actively hallucinating. Thought content is paranoid. Cognition and memory are impaired. He expresses impulsivity.    Review of Systems  Neurological: Negative.   Psychiatric/Behavioral: Positive for hallucinations.  All other systems reviewed and are negative.   Blood pressure 116/76, pulse 66, temperature (!) 97.5 F (36.4 C), temperature source Oral, resp. rate 18, height 6\' 2"  (1.88 m), weight 83.5 kg (184 lb), SpO2 100 %.Body mass index is 23.62 kg/m.  General Appearance: Casual  Eye Contact:  Minimal  Speech:  Slow  Volume:  Normal  Mood:  Euthymic  Affect:  Flat  Thought Process:  Goal Directed and Descriptions of Associations: Intact  Orientation:  Full (Time, Place, and Person)  Thought Content:  Delusions, Hallucinations: Auditory and Paranoid Ideation  Suicidal Thoughts:  No  Homicidal Thoughts:  No  Memory:  Immediate;   Fair Recent;   Fair Remote;   Fair  Judgement:  Poor  Insight:  Lacking  Psychomotor Activity:  Psychomotor Retardation  Concentration:  Concentration: Fair and Attention Span: Fair  Recall:  [employer] of Knowledge:  Fair  Language:  Fair  Akathisia:  No  Handed:  Right  AIMS (if indicated):     Assets:  Communication Skills Desire for Improvement Financial Resources/Insurance Housing Physical  Health Resilience Social Support  ADL's:  Intact  Cognition:  WNL  Sleep:  Number of Hours: 7.3     Treatment Plan Summary: Daily contact with patient to assess and evaluate symptoms and progress in treatment and Medication management   Mr. Majka is a 22 year old male with a history of schizophrenia admitted for psychotic break in the context of treatment noncomplinace. He was started on Zyprexa in the ER and we  added Clozapine titration.   #Psychosis, still floridly psychotic -continue Zyprexa zydis 15 mg and offer Zyprexa 10 mg IM if refused -continue Clozapine titration 100 mg nightly tonight -Clozapine is slow to "kick in" and we will titrate slowly in fear of clozapine-induced myocarditis/cardiomyopathy most frequently afflicting young males. We realize that Zyprexa is not the best choice to combine with Clozapine but there are allergies to some antipsychotics  #Insomnia, improved with treatment -Trazodone 100 mg nightly PRN only  #Labs -lipid panel, TSH and A1C are unremerkable -EKG reviewed, NSR with QTc 374  #Disposition -discharge to home with family -follow up with [facility] or ACT team -needs information on [facility] program      Kristine Linea, MD 01/02/2018, 12:32 PM

## 2018-01-02 NOTE — Plan of Care (Signed)
Pt calm this evening and was pacing at times. Pt refused medications at first but did take them after some encouragement. Pt denies SI/HI. Pt is receptive to treatment and safety maintained on unit. Will continue to monitor. Problem: Education: Goal: Knowledge of [place] [facility] Education information/materials will improve Outcome: Progressing Goal: Emotional status will improve Outcome: Progressing Goal: Mental status will improve Outcome: Progressing   Problem: Coping: Goal: Ability to verbalize frustrations and anger appropriately will improve Outcome: Progressing

## 2018-01-02 NOTE — Plan of Care (Signed)
D: Patient denies SI/HI/AVH. Patient verbally contracts for safety. Patient is calm, cooperative and pleasant. Patient is avoidant, paranoid, and interaction is minimal. Patient is seen in milieu pacing with no interaction with peers. Patient has no complaints at this time. Patient did not complete an inventory sheet today.  A: Patient was assessed by this nurse. Patient was oriented to unit. Patient's safety was maintained on unit. Q x 15 minute observation checks were completed for safety. Patient care plan was reviewed. Patient was offered support and encouragement. Patient was encourage to attend groups, participate in unit activities and continue with plan of care.    R: Patient has no complaints of pain at this time.    Problem: Education: Goal: Knowledge of [place] [facility] Education information/materials will improve Outcome: Not Progressing   Problem: Coping: Goal: Ability to demonstrate self-control will improve Outcome: Not Progressing

## 2018-01-02 NOTE — [facility] Group Notes (Signed)
LCSW Group Therapy Note  01/02/2018 1:00pm  Type of Therapy and Topic:  Group Therapy:  Feelings around Relapse and Recovery  Participation Level:  Did Not Attend   Description of Group:    Patients in this group will discuss emotions they experience before and after a relapse. They will process how experiencing these feelings, or avoidance of experiencing them, relates to having a relapse. Facilitator will guide patients to explore emotions they have related to recovery. Patients will be encouraged to process which emotions are more powerful. They will be guided to discuss the emotional reaction significant others in their lives may have to their relapse or recovery. Patients will be assisted in exploring ways to respond to the emotions of others without this contributing to a relapse.  Therapeutic Goals: 1. Patient will identify two or more emotions that lead to a relapse for them 2. Patient will identify two emotions that result when they relapse 3. Patient will identify two emotions related to recovery 4. Patient will demonstrate ability to communicate their needs through discussion and/or role plays   Summary of Patient Progress:  Gabriella was invited to today's group, but chose not to attend.   Therapeutic Modalities:   Cognitive Behavioral Therapy Solution-Focused Therapy Assertiveness Training Relapse Prevention Therapy   Alease FrameSonya S Darrelle Barrell, LCSW 01/02/2018 2:31 PM

## 2018-01-02 NOTE — Progress Notes (Signed)
Patient's Cell phone was turned over to patients mother. Patient is aware and has signed property sheet.

## 2018-01-03 NOTE — [facility] Group Notes (Signed)
LCSW Group Therapy Note  01/03/2018 1:15pm  Type of Therapy and Topic:  Group Therapy:  Fears and Unhealthy Coping Skills  Participation Level:  Did Not Attend   Description of Group:  The focus of this group was to discuss some of the prevalent fears that patients experience, and to identify the commonalities among group members.  An exercise was used to initiate the discussion, followed by writing on the white board a group-generated list of unhealthy coping and healthy coping techniques to deal with each fear.    Therapeutic Goals: 1. Patient will identify and describe 3 fears they experience 2. Patient will identify one positive coping strategy for each fear they experience 3. Patient will respond empathically to peers statements regarding fears they experience  Summary of Patient Progress:  Pt was invited to attend group but chose not to attend. CSW will continue to encourage pt to attend group throughout their admission.     Therapeutic Modalities Cognitive Behavioral Therapy Motivational Interviewing  Jorge Retz  CUEBAS-COLON, LCSW 01/03/2018 4:41 PM  

## 2018-01-03 NOTE — Plan of Care (Signed)
Pt at the beginning of shift was pacing and trying to open up outside doors, becoming more agitated. Called Doctor, and Ativan 2 mg ordered. Pt did receive it along with evening medications. No other issues noted after medication given. Pt denies SI/HI. Pt is receptive to treatment and safety maintained on unit.  Problem: Education: Goal: Knowledge of [place] [facility] Education information/materials will improve Outcome: Progressing Goal: Emotional status will improve Outcome: Progressing Goal: Mental status will improve Outcome: Progressing Goal: Verbalization of understanding the information provided will improve Outcome: Progressing   Problem: Coping: Goal: Ability to demonstrate self-control will improve Outcome: Progressing   Problem: Health Behavior/Discharge Planning: Goal: Compliance with treatment plan for underlying cause of condition will improve Outcome: Progressing

## 2018-01-03 NOTE — Plan of Care (Signed)
Patient stayed in bed most of the day.Denies SI HI and AVH.Visible in the milieu in the evening.Appropriate with staff & peers.Support and encouragement given.

## 2018-01-03 NOTE — Progress Notes (Signed)
[facility] MD Progress Note  01/03/2018 3:18 PM Loc A Rutledge  MRN:  [id number]  Subjective:   Pt in bed, wanting to sleep, pt reportedly paces at times, denies AVH. Denies SI/HI, taking meds, Principal Problem: Schizophrenia, undifferentiated (HCC) Diagnosis:   Patient Active Problem List   Diagnosis Date Noted  . Schizophrenia, undifferentiated (HCC) [F20.3] 12/30/2017  . Noncompliance [Z91.19] 12/30/2017  . Aggressive behavior of adolescent [R46.89] 10/24/2015   Total Time spent with patient: 25 min  Past Psychiatric History: schizophrenia  Past Medical History:  Past Medical History:  Diagnosis Date  . Asthma   . Depression   . Psychosis [facility])     Past Surgical History:  Procedure Laterality Date  . BACK SURGERY     Family History: History reviewed. No pertinent family history. Family Psychiatric  History: none Social History:  Social History   Substance and Sexual Activity  Alcohol Use No     Social History   Substance and Sexual Activity  Drug Use Yes  . Types: Marijuana    Social History   Socioeconomic History  . Marital status: Single    Spouse name: Not on file  . Number of children: Not on file  . Years of education: Not on file  . Highest education level: Not on file  Occupational History  . Not on file  Social Needs  . Financial resource strain: Not on file  . Food insecurity:    Worry: Not on file    Inability: Not on file  . Transportation needs:    Medical: Not on file    Non-medical: Not on file  Tobacco Use  . Smoking status: Never Smoker  . Smokeless tobacco: Never Used  Substance and Sexual Activity  . Alcohol use: No  . Drug use: Yes    Types: Marijuana  . Sexual activity: Not Currently  Lifestyle  . Physical activity:    Days per week: Not on file    Minutes per session: Not on file  . Stress: Not on file  Relationships  . Social connections:    Talks on phone: Not on file    Gets together: Not on file    Attends  religious service: Not on file    Active member of club or organization: Not on file    Attends meetings of clubs or organizations: Not on file    Relationship status: Not on file  Other Topics Concern  . Not on file  Social History Narrative  . Not on file   Additional Social History:                         Sleep: Fair  Appetite:  Poor  Current Medications: Current Facility-Administered Medications  Medication Dose Route Frequency Provider Last Rate Last Dose  . acetaminophen (TYLENOL) tablet 650 mg  650 mg Oral Q6H PRN [name], [name], MD      . alum & mag hydroxide-simeth (MAALOX/MYLANTA) 200-200-20 MG/5ML suspension 30 mL  30 mL Oral Q4H PRN [name], [name], MD      . cloZAPine (CLOZARIL) tablet 100 mg  100 mg Oral QHS [name], [name], MD   100 mg at 01/02/18 2100  . hydrOXYzine (ATARAX/VISTARIL) tablet 50 mg  50 mg Oral TID PRN [name], [name], MD   50 mg at 12/30/17 2220  . magnesium hydroxide (MILK OF MAGNESIA) suspension 30 mL  30 mL Oral Daily PRN [name], [name], MD      .  OLANZapine zydis (ZYPREXA) disintegrating tablet 20 mg  20 mg Oral QHS [name], [name], MD   20 mg at 01/02/18 2100   Or  . OLANZapine (ZYPREXA) injection 10 mg  10 mg Intramuscular QHS [name], [name], MD      . traZODone (DESYREL) tablet 100 mg  100 mg Oral QHS PRN [name], [name], MD   100 mg at 12/31/17 2056    Lab Results: No results found for this or any previous visit (from the past 48 hour(s)).  Blood Alcohol level:  Lab Results  Component Value Date   ETH <10 12/30/2017   ETH <5 09/29/2016    Metabolic Disorder Labs: Lab Results  Component Value Date   HGBA1C 4.6 (L) 12/30/2017   MPG 85.32 12/30/2017   No results found for: PROLACTIN Lab Results  Component Value Date   CHOL 185 12/30/2017   TRIG 125 12/30/2017   HDL 63 12/30/2017   CHOLHDL 2.9 12/30/2017   VLDL 25 12/30/2017   LDLCALC 97 12/30/2017    Physical Findings: AIMS:  , ,  ,  ,     CIWA:    COWS:     Musculoskeletal: Strength & Muscle Tone: within normal limits Gait & Station: normal Patient leans: N/A  Psychiatric Specialty Exam: Physical Exam  Nursing note and vitals reviewed. Psychiatric: His affect is blunt. His speech is delayed. He is slowed, withdrawn and actively hallucinating. Thought content is paranoid. Cognition and memory are impaired. He expresses impulsivity.      Blood pressure 127/81, pulse 72, temperature 97.6 F (36.4 C), temperature source Oral, resp. rate 18, height 6\' 2"  (1.88 m), weight 83.5 kg (184 lb), SpO2 100 %.Body mass index is 23.62 kg/m.  General Appearance: Casual  Eye Contact:  Minimal  Speech:  Slow  Volume:  Normal  Mood:  Ok"  Affect:  blunted  Thought Process:  concrete  Orientation:  Full (Time, Place, and Person)  Thought Content:  Delusions, Hallucinations: Auditory and Paranoid Ideation  Suicidal Thoughts:  No  Homicidal Thoughts:  No  Memory:  Immediate;   Fair Recent;   Fair Remote;   Fair  Judgement:  Poor  Insight:  Lacking  Psychomotor Activity:  Psychomotor Retardation  Concentration:  Concentration: Fair and Attention Span: Fair  Recall:  [employer]Fair  Fund of Knowledge:  Fair  Language:  Fair  Akathisia:  No  Handed:  Right  AIMS (if indicated):     Assets:    ADL's:  Intact  Cognition:  WNL  Sleep:  Number of Hours: 7.3     Treatment Plan Summary: Daily contact with patient to assess and evaluate symptoms and progress in treatment and Medication management   Mr. Hulan AmatoMustafa is a 22 year old male with a history of schizophrenia admitted for psychotic break. Pt still psychotic.  #Psychosis,  -continue Zyprexa zydis 15 mg and offer Zyprexa 10 mg IM if refused -continue Clozapine titration 100 mg nightly tonight   -Trazodone 100 mg nightly PRN only for sleep  #Labs -lipid panel, TSH and A1C are unremerkable -EKG reviewed, NSR with QTc 374  #Disposition -per primary  attending/team    Beverly SessionsJagannath Laquitta Dominski, MD 01/03/2018, 3:18 PMPatient ID: [name] GinsMohmed A Dutko, male   DOB: [birth year], 22 y.o.   MRN: [id number]

## 2018-01-03 NOTE — Progress Notes (Signed)
Patient visible in the milieu upon the beginning of this shift. Pacing and guarded upon approach. Avoiding deep conversations "I really don't like to talk about myself". Restless and unable to stay still in the dayroom or bedroom. Denies suicidal/homicidal thoughts. Denies hallucinations but appears to be responding to internal stimuli. Hypervigilant. No sign of aggressive behaviors. Patient agrees to talk to staff as needed. Will continue to monitor.

## 2018-01-04 MED ORDER — OLANZAPINE 5 MG PO TABS
5.0000 mg | ORAL_TABLET | Freq: Four times a day (QID) | ORAL | Status: DC | PRN
  Administered 2018-01-04: 5 mg via ORAL
  Filled 2018-01-04: qty 1

## 2018-01-04 MED ORDER — OLANZAPINE 10 MG IM SOLR: 5.0000 mg | Freq: Four times a day (QID) | INTRAMUSCULAR | Status: DC | PRN

## 2018-01-04 NOTE — Progress Notes (Signed)
Patient stayed in the hallway pacing. Displaying bizarre behavior, guarded and suspicious. Preoccupied. Denial of mental health care need: Refused medications saying "nothing wrong with me, I will not take any medications". Becomes agitated when medications discussed. Stayed in the milieu and currently in bed sleeping and safety precautions maintained.

## 2018-01-04 NOTE — [facility] Group Notes (Signed)
LCSW Group Therapy Note 01/04/2018 1:15pm  Type of Therapy and Topic: Group Therapy: Feelings Around Returning Home & Establishing a Supportive Framework and Supporting Oneself When Supports Not Available  Participation Level: Did Not Attend  Description of Group:  Patients first processed thoughts and feelings about upcoming discharge. These included fears of upcoming changes, lack of change, new living environments, judgements and expectations from others and overall stigma of mental health issues. The group then discussed the definition of a supportive framework, what that looks and feels like, and how do to discern it from an unhealthy non-supportive network. The group identified different types of supports as well as what to do when your family/friends are less than helpful or unavailable  Therapeutic Goals  1. Patient will identify one healthy supportive network that they can use at discharge. 2. Patient will identify one factor of a supportive framework and how to tell it from an unhealthy network. 3. Patient able to identify one coping skill to use when they do not have positive supports from others. 4. Patient will demonstrate ability to communicate their needs through discussion and/or role plays.  Summary of Patient Progress:  Pt was invited to attend group but chose not to attend. CSW will continue to encourage pt to attend group throughout their admission.   Therapeutic Modalities Cognitive Behavioral Therapy Motivational Interviewing   Johnnye SimaNNIA  CUEBAS-COLON, LCSW 01/04/2018 12:39 PM

## 2018-01-04 NOTE — Plan of Care (Signed)
Resistant to care: not participating in unit activities, pacing. Refusing medications

## 2018-01-04 NOTE — Plan of Care (Signed)
Patient denies SI, HI and AVH. Patient continues to be guarded and paranoid. Paces the hallways. Patient did ask for something for anxiety and Vistaril was given. Patient interaction with nurse is minimal. Patient is not able to voice to nurse any frustrations or coping skills. According to night shift report patient continues to refuse night time medications. Nurse will continue to monitor. Problem: Education: Goal: Knowledge of [place] [facility] Education information/materials will improve Outcome: Not Progressing Goal: Emotional status will improve Outcome: Not Progressing Goal: Mental status will improve Outcome: Not Progressing Goal: Verbalization of understanding the information provided will improve Outcome: Not Progressing   Problem: Coping: Goal: Ability to verbalize frustrations and anger appropriately will improve Outcome: Not Progressing Goal: Ability to demonstrate self-control will improve Outcome: Not Progressing   Problem: Health Behavior/Discharge Planning: Goal: Identification of resources available to assist in meeting health care needs will improve Outcome: Not Progressing Goal: Compliance with treatment plan for underlying cause of condition will improve Outcome: Not Progressing

## 2018-01-04 NOTE — Progress Notes (Signed)
[facility] MD Progress Note  01/04/2018 12:24 PM [name]  MRN:  [id number]  Subjective:   Pt refused hs meds, slept 3.30 hrs, contniue to be paranoid, paces in the hallway, denies refusing meds, states he will take it today, wanting to go home.  Principal Problem: Schizophrenia, undifferentiated (HCC) Diagnosis:   Patient Active Problem List   Diagnosis Date Noted  . Schizophrenia, undifferentiated (HCC) [F20.3] 12/30/2017  . Noncompliance [Z91.19] 12/30/2017  . Aggressive behavior of adolescent [R46.89] 10/24/2015   Total Time spent with patient: 25 min  Past Psychiatric History: schizophrenia  Past Medical History:  Past Medical History:  Diagnosis Date  . Asthma   . Depression   . Psychosis [facility])     Past Surgical History:  Procedure Laterality Date  . BACK SURGERY     Family History: History reviewed. No pertinent family history. Family Psychiatric  History: none Social History:  Social History   Substance and Sexual Activity  Alcohol Use No     Social History   Substance and Sexual Activity  Drug Use Yes  . Types: Marijuana    Social History   Socioeconomic History  . Marital status: Single    Spouse name: Not on file  . Number of children: Not on file  . Years of education: Not on file  . Highest education level: Not on file  Occupational History  . Not on file  Social Needs  . Financial resource strain: Not on file  . Food insecurity:    Worry: Not on file    Inability: Not on file  . Transportation needs:    Medical: Not on file    Non-medical: Not on file  Tobacco Use  . Smoking status: Never Smoker  . Smokeless tobacco: Never Used  Substance and Sexual Activity  . Alcohol use: No  . Drug use: Yes    Types: Marijuana  . Sexual activity: Not Currently  Lifestyle  . Physical activity:    Days per week: Not on file    Minutes per session: Not on file  . Stress: Not on file  Relationships  . Social connections:    Talks on phone: Not  on file    Gets together: Not on file    Attends religious service: Not on file    Active member of club or organization: Not on file    Attends meetings of clubs or organizations: Not on file    Relationship status: Not on file  Other Topics Concern  . Not on file  Social History Narrative  . Not on file   Additional Social History:                         Sleep: Fair  Appetite:  Poor  Current Medications: Current Facility-Administered Medications  Medication Dose Route Frequency Provider Last Rate Last Dose  . acetaminophen (TYLENOL) tablet 650 mg  650 mg Oral Q6H PRN [name], [name], MD      . alum & mag hydroxide-simeth (MAALOX/MYLANTA) 200-200-20 MG/5ML suspension 30 mL  30 mL Oral Q4H PRN [name], [name], MD      . cloZAPine (CLOZARIL) tablet 100 mg  100 mg Oral QHS [name], [name], MD   100 mg at 01/02/18 2100  . hydrOXYzine (ATARAX/VISTARIL) tablet 50 mg  50 mg Oral TID PRN [name], [name], MD   50 mg at 01/04/18 1125  . magnesium hydroxide (MILK OF MAGNESIA) suspension 30 mL  30 mL Oral Daily PRN [name], [name], MD      . OLANZapine zydis (ZYPREXA) disintegrating tablet 20 mg  20 mg Oral QHS [name], [name], MD   20 mg at 01/02/18 2100   Or  . OLANZapine (ZYPREXA) injection 10 mg  10 mg Intramuscular QHS [name], [name], MD      . traZODone (DESYREL) tablet 100 mg  100 mg Oral QHS PRN [name], [name], MD   100 mg at 12/31/17 2056    Lab Results: No results found for this or any previous visit (from the past 48 hour(s)).  Blood Alcohol level:  Lab Results  Component Value Date   ETH <10 12/30/2017   ETH <5 09/29/2016    Metabolic Disorder Labs: Lab Results  Component Value Date   HGBA1C 4.6 (L) 12/30/2017   MPG 85.32 12/30/2017   No results found for: PROLACTIN Lab Results  Component Value Date   CHOL 185 12/30/2017   TRIG 125 12/30/2017   HDL 63 12/30/2017   CHOLHDL 2.9 12/30/2017   VLDL 25 12/30/2017   LDLCALC 97 12/30/2017     Physical Findings: AIMS:  , ,  ,  ,    CIWA:    COWS:     Musculoskeletal: Strength & Muscle Tone: within normal limits Gait & Station: normal Patient leans: N/A  Psychiatric Specialty Exam: Physical Exam  Nursing note and vitals reviewed. Psychiatric: His affect is blunt. His speech is delayed. He is slowed, withdrawn and actively hallucinating. Thought content is paranoid. Cognition and memory are impaired. He expresses impulsivity.      Blood pressure 126/89, pulse 93, temperature 98.1 F (36.7 C), temperature source Oral, resp. rate 18, height 6\' 2"  (1.88 m), weight 83.5 kg (184 lb), SpO2 99 %.Body mass index is 23.62 kg/m.  General Appearance: Casual  Eye Contact:  Minimal  Speech:  Slow  Volume:  Normal  Mood:  anxious  Affect:  blunted  Thought Process:  concrete  Orientation:  Full (Time, Place, and Person)  Thought Content:  Delusions, Hallucinations: Auditory and Paranoid Ideation, paranoid  Suicidal Thoughts:  No  Homicidal Thoughts:  No  Memory:  Immediate;   Fair Recent;   Fair Remote;   Fair  Judgement:  Poor  Insight:  poor  Psychomotor Activity:  Psychomotor Retardation  Concentration:  Concentration: Fair and Attention Span: Fair  Recall:  [employer]Fair  Fund of Knowledge:  Fair  Language:  Fair  Akathisia:  No  Handed:  Right  AIMS (if indicated):     Assets:    ADL's:  Intact  Cognition:  WNL  Sleep:  Number of Hours: 3.5     Treatment Plan Summary: Daily contact with patient to assess and evaluate symptoms and progress in treatment and Medication management    Pt still psychotic, refusing meds, poor insight.  #Psychosis,  -continue Zyprexa zydis 15 mg and offer Zyprexa 10 mg IM if refused -continue Clozapine titration 100 mg nightly    -Trazodone 100 mg nightly PRN only for sleep  #Labs -lipid panel, TSH and A1C are unremerkable -EKG reviewed, NSR with QTc 374  #Disposition -per primary attending/team    Beverly SessionsJagannath Rylon Poitra,  MD 01/04/2018, 12:24 PMPatient ID: [name] GinsMohmed A [name], male   DOB: 11/27/1995, 22 y.o.   MRN: [id number] Patient ID: [name] GinsMohmed A [name], male   DOB: 11/27/1995, 22 y.o.   MRN: [id number]

## 2018-01-04 NOTE — Plan of Care (Signed)
Patient did take his night medication and was calm, remain in his room isolative but encourage to participate more in scheduled activities, safe in the unit, contract for safety of self and others, denies any SI/HI and no signes of AVH at this time  15 minute safety rounding is maintained .  Problem: Education: Goal: Knowledge of [place] [facility] Education information/materials will improve Outcome: Progressing Goal: Emotional status will improve Outcome: Progressing Goal: Mental status will improve Outcome: Progressing Goal: Verbalization of understanding the information provided will improve Outcome: Progressing   Problem: Coping: Goal: Ability to verbalize frustrations and anger appropriately will improve Outcome: Progressing Goal: Ability to demonstrate self-control will improve Outcome: Progressing   Problem: Health Behavior/Discharge Planning: Goal: Identification of resources available to assist in meeting health care needs will improve Outcome: Progressing Goal: Compliance with treatment plan for underlying cause of condition will improve Outcome: Progressing   Problem: Elimination: Goal: Will not experience complications related to bowel motility Outcome: Progressing

## 2018-01-05 MED ORDER — CLOZAPINE 25 MG PO TABS
150.0000 mg | ORAL_TABLET | Freq: Every day | ORAL | Status: DC
  Administered 2018-01-05 – 2018-01-06 (×2): 150 mg via ORAL
  Filled 2018-01-05 (×2): qty 1

## 2018-01-05 NOTE — Tx Team (Signed)
Interdisciplinary Treatment and Diagnostic Plan Update  01/05/2018 Time of Session: 10:30am [name] [name] MRN: [id number]  Principal Diagnosis: Schizophrenia, undifferentiated (HCC)  Secondary Diagnoses: Principal Problem:   Schizophrenia, undifferentiated (HCC) Active Problems:   Noncompliance   Current Medications:  Current Facility-Administered Medications  Medication Dose Route Frequency Provider Last Rate Last Dose  . acetaminophen (TYLENOL) tablet 650 mg  650 mg Oral Q6H PRN [name], [name], MD      . alum & mag hydroxide-simeth (MAALOX/MYLANTA) 200-200-20 MG/5ML suspension 30 mL  30 mL Oral Q4H PRN [name], [name], MD      . cloZAPine (CLOZARIL) tablet 100 mg  100 mg Oral QHS [name], [name], MD   100 mg at 01/04/18 2109  . hydrOXYzine (ATARAX/VISTARIL) tablet 50 mg  50 mg Oral TID PRN [name], [name], MD   50 mg at 01/04/18 1125  . magnesium hydroxide (MILK OF MAGNESIA) suspension 30 mL  30 mL Oral Daily PRN [name], [name], MD      . OLANZapine zydis (ZYPREXA) disintegrating tablet 20 mg  20 mg Oral QHS [name], [name], MD   20 mg at 01/04/18 2110   Or  . OLANZapine (ZYPREXA) injection 10 mg  10 mg Intramuscular QHS [name], [name], MD      . OLANZapine (ZYPREXA) tablet 5 mg  5 mg Oral Q6H PRN [name], [name], MD   5 mg at 01/04/18 1450   Or  . OLANZapine (ZYPREXA) injection 5 mg  5 mg Intramuscular Q6H PRN [name], [name], MD      . traZODone (DESYREL) tablet 100 mg  100 mg Oral QHS PRN [name], [name], MD   100 mg at 12/31/17 2056   PTA Medications: Medications Prior to Admission  Medication Sig Dispense Refill Last Dose  . doxycycline (VIBRA-TABS) 100 MG tablet Take 1 tablet (100 mg total) by mouth 2 (two) times daily. (Patient not taking: Reported on 12/30/2017) 19 tablet 0 Not Taking at Unknown time  . HYDROcodone-acetaminophen (NORCO) 5-325 MG tablet Take 1 tablet by mouth every 6 (six) hours as needed. (Patient not taking: Reported on  12/30/2017) 10 tablet 0 Not Taking at Unknown time    Patient Stressors: Medication change or noncompliance  Patient Strengths: Capable of independent living Communication skills Physical Health Supportive family/friends  Treatment Modalities: Medication Management, Group therapy, Case management,  1 to 1 session with clinician, Psychoeducation, Recreational therapy.   Physician Treatment Plan for Primary Diagnosis: Schizophrenia, undifferentiated (HCC) Long Term Goal(s): Improvement in symptoms so as ready for discharge NA   Short Term Goals: Ability to identify changes in lifestyle to reduce recurrence of condition will improve Ability to verbalize feelings will improve Ability to disclose and discuss suicidal ideas Ability to demonstrate self-control will improve Ability to identify and develop effective coping behaviors will improve Ability to maintain clinical measurements within normal limits will improve Compliance with prescribed medications will improve Ability to identify triggers associated with substance abuse/mental health issues will improve NA  Medication Management: Evaluate patient's response, side effects, and tolerance of medication regimen.  Therapeutic Interventions: 1 to 1 sessions, Unit Group sessions and Medication administration.  Evaluation of Outcomes: Progressing  Physician Treatment Plan for Secondary Diagnosis: Principal Problem:   Schizophrenia, undifferentiated (HCC) Active Problems:   Noncompliance  Long Term Goal(s): Improvement in symptoms so as ready for discharge NA   Short Term Goals: Ability to identify changes in lifestyle to reduce recurrence of condition will improve Ability to verbalize feelings will improve  Ability to disclose and discuss suicidal ideas Ability to demonstrate self-control will improve Ability to identify and develop effective coping behaviors will improve Ability to maintain clinical measurements within normal  limits will improve Compliance with prescribed medications will improve Ability to identify triggers associated with substance abuse/mental health issues will improve NA     Medication Management: Evaluate patient's response, side effects, and tolerance of medication regimen.  Therapeutic Interventions: 1 to 1 sessions, Unit Group sessions and Medication administration.  Evaluation of Outcomes: Progressing   RN Treatment Plan for Primary Diagnosis: Schizophrenia, undifferentiated (HCC) Long Term Goal(s): Knowledge of disease and therapeutic regimen to maintain health will improve  Short Term Goals: Ability to verbalize feelings will improve, Ability to identify and develop effective coping behaviors will improve and Compliance with prescribed medications will improve  Medication Management: RN will administer medications as ordered by provider, will assess and evaluate patient's response and provide education to patient for prescribed medication. RN will report any adverse and/or side effects to prescribing provider.  Therapeutic Interventions: 1 on 1 counseling sessions, Psychoeducation, Medication administration, Evaluate responses to treatment, Monitor vital signs and CBGs as ordered, Perform/monitor CIWA, COWS, AIMS and Fall Risk screenings as ordered, Perform wound care treatments as ordered.  Evaluation of Outcomes: Progressing   LCSW Treatment Plan for Primary Diagnosis: Schizophrenia, undifferentiated (HCC) Long Term Goal(s): Safe transition to appropriate next level of care at discharge, Engage patient in therapeutic group addressing interpersonal concerns.  Short Term Goals: Engage patient in aftercare planning with referrals and resources, Increase social support, Facilitate acceptance of mental health diagnosis and concerns, Identify triggers associated with mental health/substance abuse issues and Increase skills for wellness and recovery  Therapeutic Interventions: Assess  for all discharge needs, 1 to 1 time with Social worker, Explore available resources and support systems, Assess for adequacy in community support network, Educate family and significant other(s) on suicide prevention, Complete Psychosocial Assessment, Interpersonal group therapy.  Evaluation of Outcomes: Progressing   Progress in Treatment: Attending groups: No. Participating in groups: No. Taking medication as prescribed: Yes. Toleration medication: Yes. Family/Significant other contact made: No, will contact:  Patient refused Patient understands diagnosis: Yes. Discussing patient identified problems/goals with staff: Yes. Medical problems stabilized or resolved: Yes. Denies suicidal/homicidal ideation: Yes. Issues/concerns per patient self-inventory: No. Other:   New problem(s) identified: No, Describe:  None  New Short Term/Long Term Goal(s): *Patient did not attend.*  Patient Goals:  *Patient did not attend.*  Discharge Plan or Barriers: To return home and follow up with outpatient treatment.  Reason for Continuation of Hospitalization: Medication stabilization  Estimated Length of Stay: 7 days  Recreational Therapy: Patient Stressors: N/A  Patient Goal: Patient will engage in interactions with peers and staff in pro-social manner at least 2x within 5 recreation therapy group sessions  Attendees: Patient: 01/05/2018 12:18 PM  Physician: Dr. Jennet Maduro, MD 01/05/2018 12:18 PM  Nursing: Hulan Amato, RN 01/05/2018 12:18 PM  RN Care Manager: 01/05/2018 12:18 PM  Social Worker: [name], LCSWA 01/05/2018 12:18 PM  Recreational Therapist: Danella Deis. Dreama Saa, LRT 01/05/2018 12:18 PM  Other: Heidi Dach, LCSW 01/05/2018 12:18 PM  Other: Damian Leavell, Chaplin 01/05/2018 12:18 PM  Other: Unk Pinto, RHA 01/05/2018 12:18 PM    Scribe for Treatment Team: [name], LCSW 01/05/2018 12:18 PM

## 2018-01-05 NOTE — Plan of Care (Signed)
Patient verbalizes understanding of the general information that's been provided to him and all questions/concerns have been addressed and answered at this time. Patient denies SI/HI/AVH as well as any signs/symptoms of depression/anxiety at this time. Patient has the ability to verbalize frustrations in an appropriate manner and has also demonstrated self-control on the unit. Patient has been in compliance with with his treatment plan and has not experienced any health-related complications at this time. Patient remains safe on the unit.   Problem: Education: Goal: Knowledge of [facility] Education information/materials will improve Outcome: Progressing Goal: Emotional status will improve Outcome: Progressing Goal: Mental status will improve Outcome: Progressing Goal: Verbalization of understanding the information provided will improve Outcome: Progressing   Problem: Coping: Goal: Ability to verbalize frustrations and anger appropriately will improve Outcome: Progressing Goal: Ability to demonstrate self-control will improve Outcome: Progressing   Problem: Health Behavior/Discharge Planning: Goal: Identification of resources available to assist in meeting health care needs will improve Outcome: Progressing Goal: Compliance with treatment plan for underlying cause of condition will improve Outcome: Progressing   Problem: Elimination: Goal: Will not experience complications related to bowel motility Outcome: Progressing

## 2018-01-05 NOTE — [facility] Group Notes (Signed)
LCSW Group Note  01/05/2018  Time: 1PM   Type of Therapy and Topic:  Group Therapy:  Overcoming Obstacles   Participation Level:  None   Description of Group:   In this group patients will be encouraged to explore what they see as obstacles to their own wellness and recovery. They will be guided to discuss their thoughts, feelings, and behaviors related to these obstacles. The group will process together ways to cope with barriers, with attention given to specific choices patients can make. Each patient will be challenged to identify changes they are motivated to make in order to overcome their obstacles. This group will be process-oriented, with patients participating in exploration of their own experiences, giving and receiving support, and processing challenge from other group members.   Therapeutic Goals: 1. Patient will identify personal and current obstacles as they relate to admission. 2. Patient will identify barriers that currently interfere with their wellness or overcoming obstacles.  3. Patient will identify feelings, thought process and behaviors related to these barriers. 4. Patient will identify two changes they are willing to make to overcome these obstacles:      Summary of Patient Progress  Pt was observed walking in and out of group, but did not participate in the group discussion.    Therapeutic Modalities:   Cognitive Behavioral Therapy Solution Focused Therapy Motivational Interviewing Relapse Prevention Therapy  Heidi DachKelsey Banner Huckaba, MSW, LCSW Clinical Social Worker 01/05/2018 2:00 PM

## 2018-01-05 NOTE — Progress Notes (Signed)
[facility] MD Progress Note  01/05/2018 12:41 PM [name] [name]  MRN:  [id number]  Subjective:    [name] [name] refused medications over the weekend but accepted them last night. He is asleep and hard to arouse this morning. Has no complaints but neither he has any insight into his problems. Will continue Clozapine titration.   Principal Problem: Schizophrenia, undifferentiated (HCC) Diagnosis:   Patient Active Problem List   Diagnosis Date Noted  . Schizophrenia, undifferentiated (HCC) [F20.3] 12/30/2017    Priority: High  . Noncompliance [Z91.19] 12/30/2017  . Aggressive behavior of adolescent [R46.89] 10/24/2015   Total Time spent with patient: 15 minutes  Past Psychiatric History: schizophrenia  Past Medical History:  Past Medical History:  Diagnosis Date  . Asthma   . Depression   . Psychosis [facility])     Past Surgical History:  Procedure Laterality Date  . BACK SURGERY     Family History: History reviewed. No pertinent family history. Family Psychiatric  History: none Social History:  Social History   Substance and Sexual Activity  Alcohol Use No     Social History   Substance and Sexual Activity  Drug Use Yes  . Types: Marijuana    Social History   Socioeconomic History  . Marital status: Single    Spouse name: Not on file  . Number of children: Not on file  . Years of education: Not on file  . Highest education level: Not on file  Occupational History  . Not on file  Social Needs  . Financial resource strain: Not on file  . Food insecurity:    Worry: Not on file    Inability: Not on file  . Transportation needs:    Medical: Not on file    Non-medical: Not on file  Tobacco Use  . Smoking status: Never Smoker  . Smokeless tobacco: Never Used  Substance and Sexual Activity  . Alcohol use: No  . Drug use: Yes    Types: Marijuana  . Sexual activity: Not Currently  Lifestyle  . Physical activity:    Days per week: Not on file    Minutes per session:  Not on file  . Stress: Not on file  Relationships  . Social connections:    Talks on phone: Not on file    Gets together: Not on file    Attends religious service: Not on file    Active member of club or organization: Not on file    Attends meetings of clubs or organizations: Not on file    Relationship status: Not on file  Other Topics Concern  . Not on file  Social History Narrative  . Not on file   Additional Social History:                         Sleep: Fair  Appetite:  Fair  Current Medications: Current Facility-Administered Medications  Medication Dose Route Frequency Provider Last Rate Last Dose  . acetaminophen (TYLENOL) tablet 650 mg  650 mg Oral Q6H PRN [name], [name], MD      . alum & mag hydroxide-simeth (MAALOX/MYLANTA) 200-200-20 MG/5ML suspension 30 mL  30 mL Oral Q4H PRN [name], [name], MD      . cloZAPine (CLOZARIL) tablet 100 mg  100 mg Oral QHS Natacha Jepsen B, MD   100 mg at 01/04/18 2109  . hydrOXYzine (ATARAX/VISTARIL) tablet 50 mg  50 mg Oral TID PRN [name], [name], MD  50 mg at 01/04/18 1125  . magnesium hydroxide (MILK OF MAGNESIA) suspension 30 mL  30 mL Oral Daily PRN [name], [name], MD      . OLANZapine zydis (ZYPREXA) disintegrating tablet 20 mg  20 mg Oral QHS Jasson Siegmann B, MD   20 mg at 01/04/18 2110   Or  . OLANZapine (ZYPREXA) injection 10 mg  10 mg Intramuscular QHS Christon Gallaway B, MD      . OLANZapine (ZYPREXA) tablet 5 mg  5 mg Oral Q6H PRN Beverly Sessions, MD   5 mg at 01/04/18 1450   Or  . OLANZapine (ZYPREXA) injection 5 mg  5 mg Intramuscular Q6H PRN Beverly Sessions, MD      . traZODone (DESYREL) tablet 100 mg  100 mg Oral QHS PRN [name], [name], MD   100 mg at 12/31/17 2056    Lab Results: No results found for this or any previous visit (from the past 48 hour(s)).  Blood Alcohol level:  Lab Results  Component Value Date   ETH <10 12/30/2017   ETH <5 09/29/2016    Metabolic Disorder  Labs: Lab Results  Component Value Date   HGBA1C 4.6 (L) 12/30/2017   MPG 85.32 12/30/2017   No results found for: PROLACTIN Lab Results  Component Value Date   CHOL 185 12/30/2017   TRIG 125 12/30/2017   HDL 63 12/30/2017   CHOLHDL 2.9 12/30/2017   VLDL 25 12/30/2017   LDLCALC 97 12/30/2017    Physical Findings: AIMS:  , ,  ,  ,    CIWA:    COWS:     Musculoskeletal: Strength & Muscle Tone: within normal limits Gait & Station: normal Patient leans: N/A  Psychiatric Specialty Exam: Physical Exam  ROS  Blood pressure (!) 78/59, pulse (!) 126, temperature 97.6 F (36.4 C), temperature source Oral, resp. rate 16, height 6\' 2"  (1.88 m), weight 83.5 kg (184 lb), SpO2 100 %.Body mass index is 23.62 kg/m.  General Appearance: Casual  Eye Contact:  Minimal  Speech:  Slow  Volume:  Decreased  Mood:  Euthymic  Affect:  Flat  Thought Process:  Goal Directed, Irrelevant and Descriptions of Associations: Tangential  Orientation:  Full (Time, Place, and Person)  Thought Content:  Illogical, Delusions, Hallucinations: Auditory and Paranoid Ideation  Suicidal Thoughts:  No  Homicidal Thoughts:  No  Memory:  Immediate;   Fair Recent;   Fair Remote;   Fair  Judgement:  Poor  Insight:  Lacking  Psychomotor Activity:  Psychomotor Retardation  Concentration:  Concentration: Fair and Attention Span: Fair  Recall:  [employer] of Knowledge:  Fair  Language:  Fair  Akathisia:  No  Handed:  Right  AIMS (if indicated):     Assets:  Communication Skills Desire for Improvement Financial Resources/Insurance Housing Physical Health Resilience Social Support  ADL's:  Intact  Cognition:  WNL  Sleep:  Number of Hours: 6.3     Treatment Plan Summary: Daily contact with patient to assess and evaluate symptoms and progress in treatment and Medication management   [name] [name] is a 22 year old male with a history of schizophrenia admitted for psychotic break in the context of  treatment noncomplinace. On Zyprexa and Clozapine titration. Refused meds over the weekend.    #Psychosis, still floridly psychotic -continue Zyprexa zydis 15 mg and offer Zyprexa 10 mg IM if refused -continue Clozapine titration, increase to 150 mg tonight  -Clozapine is slow to "kick in" and we will titrate slowly  in fear of clozapine-induced myocarditis/cardiomyopathy most frequently afflicting young males. We realize that Zyprexa is not the best choice to combine with Clozapine but there are allergies to some antipsychotics  #Insomnia, improved with treatment -Trazodone 100 mg nightly PRN only  #Labs -lipid panel, TSH and A1Care unremerkable -EKGreviewed, NSR with QTc 374  #Disposition -discharge to home with family -[facility] -needs information on [facility]      Kristine LineaJolanta Shabreka Coulon, MD 01/05/2018, 12:41 PM

## 2018-01-05 NOTE — Progress Notes (Signed)
   [name] attended group on tonight. He was very quiet and did not share too much.  He often appeared to be pre occupied where his eyes were fixated at the lights. MHT notice that he is pleasant with others and often paces the floor.   He pulled a sheet out of the bag and it asked him to state his favorite holiday. He shared with the group that Halloween was his favorite and that all he had to say about it.  He engagement in group was limited.    [name] [name] BSW, MHT 01/05/2018

## 2018-01-05 NOTE — Plan of Care (Signed)
Pt cooperative this evening. Pt paces up and down the halls. Pt denies SI/HI. Pt was compliant with medications. Will continue to monitor. Problem: Education: Goal: Knowledge of [facility] Education information/materials will improve Outcome: Progressing Goal: Emotional status will improve Outcome: Progressing Goal: Verbalization of understanding the information provided will improve Outcome: Progressing   Problem: Coping: Goal: Ability to verbalize frustrations and anger appropriately will improve Outcome: Progressing   Problem: Health Behavior/Discharge Planning: Goal: Identification of resources available to assist in meeting health care needs will improve Outcome: Progressing Goal: Compliance with treatment plan for underlying cause of condition will improve Outcome: Progressing

## 2018-01-05 NOTE — Progress Notes (Signed)
Recreation Therapy Notes  Date: 01/05/2018  Time: 9:30 am   Location: Craft Room   Behavioral response: N/A   Intervention Topic: Happiness  Discussion/Intervention: Patient did not attend group.   Clinical Observations/Feedback:  Patient did not attend group.   Merilee Wible LRT/CTRS        [name] [name] 01/05/2018 10:26 AM 

## 2018-01-05 NOTE — Progress Notes (Signed)
D- Patient alert and oriented. Patient presents in a pleasant mood on assessment stating that he slept "well" last night and has no major complaints at this time. Patient denies SI, HI, AVH, and pain at this time. Patient also denies any signs/symptoms of depression/anxiety. Patient has no stated goals for today.  A- Support and encouragement provided.  Routine safety checks conducted every 15 minutes.  Patient informed to notify staff with problems or concerns.  R- Patient contracts for safety at this time. Patient receptive, calm, and cooperative. Patient isolates to his room throughout the day and also paces the unit. Patient remains safe at this time.

## 2018-01-06 NOTE — Progress Notes (Signed)
D- Patient alert and oriented. Patient presents in a pleasant mood on assessment stating that he's doing fine today and has no major complaints at this time. Patient denies SI, HI, AVH, and pain at this time. Patient also denies any signs/symptoms of depression/anxiety. Patient has no stated goals for today.  A- Support and encouragement provided.  Routine safety checks conducted every 15 minutes.  Patient informed to notify staff with problems or concerns.  R- Patient contracts for safety at this time. Patient receptive, calm, and cooperative. Patient isolates to his room throughout the day and also paces the unit. Patient remains safe at this time.

## 2018-01-06 NOTE — [facility] Group Notes (Signed)
CSW Group Therapy Note  01/06/2018  Time:  0900  Type of Therapy and Topic: Group Therapy: Goals Group: SMART Goals    Participation Level:  Did Not Attend    Description of Group:   The purpose of a daily goals group is to assist and guide patients in setting recovery/wellness-related goals. The objective is to set goals as they relate to the crisis in which they were admitted. Patients will be using SMART goal modalities to set measurable goals. Characteristics of realistic goals will be discussed and patients will be assisted in setting and processing how one will reach their goal. Facilitator will also assist patients in applying interventions and coping skills learned in psycho-education groups to the SMART goal and process how one will achieve defined goal.    Therapeutic Goals:  -Patients will develop and document one goal related to or their crisis in which brought them into treatment.  -Patients will be guided by LCSW using SMART goal setting modality in how to set a measurable, attainable, realistic and time sensitive goal.  -Patients will process barriers in reaching goal.  -Patients will process interventions in how to overcome and successful in reaching goal.    Patient's Goal:  Pt was invited to attend group but chose not to attend. CSW will continue to encourage pt to attend group throughout their admission.   Therapeutic Modalities:  Motivational Interviewing  Cognitive Behavioral Therapy  Crisis Intervention Model  SMART goals setting  Sly Parlee, MSW, LCSW Clinical Social Worker 01/06/2018 9:35 AM   

## 2018-01-06 NOTE — [facility] Group Notes (Signed)
01/06/2018 1PM  Type of Therapy/Topic:  Group Therapy:  Feelings about Diagnosis  Participation Level:  Did Not Attend   Description of Group:   This group will allow patients to explore their thoughts and feelings about diagnoses they have received. Patients will be guided to explore their level of understanding and acceptance of these diagnoses. Facilitator will encourage patients to process their thoughts and feelings about the reactions of others to their diagnosis and will guide patients in identifying ways to discuss their diagnosis with significant others in their lives. This group will be process-oriented, with patients participating in exploration of their own experiences, giving and receiving support, and processing challenge from other group members.   Therapeutic Goals: 1. Patient will demonstrate understanding of diagnosis as evidenced by identifying two or more symptoms of the disorder 2. Patient will be able to express two feelings regarding the diagnosis 3. Patient will demonstrate their ability to communicate their needs through discussion and/or role play  Summary of Patient Progress: Patient was encouraged and invited to attend group. Patient did not attend group. Social worker will continue to encourage group participation in the future.        Therapeutic Modalities:   Cognitive Behavioral Therapy Brief Therapy Feelings Identification    Johny ShearsCassandra  Mahamadou Weltz, LCSW 01/06/2018 2:09 PM

## 2018-01-06 NOTE — [facility] Group Notes (Signed)
[facility] Group Notes:  (Nursing/MHT/Case Management/Adjunct)  Date:  01/06/2018  Time:  9:23 PM  Type of Therapy:  Group Therapy  Participation Level:  Active  Participation Quality:  Appropriate  Affect:  Appropriate  Cognitive:  Appropriate  Insight:  Appropriate  Engagement in Group:  Engaged  Modes of Intervention:  Discussion  Summary of Progress/Problems: [name] [name] remained in group throughout the entirety. [name] [name] stated his goal was to start discharge planning with his doctor on the next day. This is the first group [name] [name] had remained in wrap up group for the entire time. Jinger NeighborsKeith D Bryauna Byrum 01/06/2018, 9:23 PM

## 2018-01-06 NOTE — Plan of Care (Signed)
Patient verbalizes understanding of the general information that's been provided to him and he has not voiced any further questions/concerns at this time. Patient denies SI/HI/AVH as well as any signs/symptoms of depression/anxiety at this time. Patient has the ability to verbalize frustrations in an appropriate manner. Although he paces the unit multiple times throughout the day, patient has demonstrated self-control on the unit. Patient has been in compliance with with his treatment plan and has not experienced any health-related complications at this time. Patient remains safe on the unit.   Problem: Education: Goal: Knowledge of [facility] Education information/materials will improve Outcome: Progressing Goal: Emotional status will improve Outcome: Progressing Goal: Mental status will improve Outcome: Progressing Goal: Verbalization of understanding the information provided will improve Outcome: Progressing   Problem: Coping: Goal: Ability to verbalize frustrations and anger appropriately will improve Outcome: Progressing Goal: Ability to demonstrate self-control will improve Outcome: Progressing   Problem: Health Behavior/Discharge Planning: Goal: Identification of resources available to assist in meeting health care needs will improve Outcome: Progressing Goal: Compliance with treatment plan for underlying cause of condition will improve Outcome: Progressing   Problem: Elimination: Goal: Will not experience complications related to bowel motility Outcome: Progressing

## 2018-01-06 NOTE — Plan of Care (Signed)
Pt paces up and down the hall all evening except when he went to group. Pt denies SI/HI. Pt compliant with medications. Pt is receptive to treatment and safety maintained on unit. Will continue to monitor. Problem: Education: Goal: Knowledge of [facility] Education information/materials will improve Outcome: Progressing Goal: Emotional status will improve Outcome: Progressing Goal: Verbalization of understanding the information provided will improve Outcome: Progressing   Problem: Health Behavior/Discharge Planning: Goal: Identification of resources available to assist in meeting health care needs will improve Outcome: Progressing Goal: Compliance with treatment plan for underlying cause of condition will improve Outcome: Progressing

## 2018-01-06 NOTE — Progress Notes (Signed)
[facility] MD Progress Note  01/06/2018 11:31 AM Ab [name] [name]  MRN:  [id number]  Subjective:    [name] [name] has been in bed all morning long. He did not have lunch. He feels "sleepy". He took medications last night. He denies any symptoms of depression, anxiety or psychosis.  Principal Problem: Schizophrenia, undifferentiated (HCC) Diagnosis:   Patient Active Problem List   Diagnosis Date Noted  . Schizophrenia, undifferentiated (HCC) [F20.3] 12/30/2017    Priority: High  . Noncompliance [Z91.19] 12/30/2017  . Aggressive behavior of adolescent [R46.89] 10/24/2015   Total Time spent with patient: 15 minutes  Past Psychiatric History: schizophrenia  Past Medical History:  Past Medical History:  Diagnosis Date  . Asthma   . Depression   . Psychosis [facility])     Past Surgical History:  Procedure Laterality Date  . BACK SURGERY     Family History: History reviewed. No pertinent family history. Family Psychiatric  History: none Social History:  Social History   Substance and Sexual Activity  Alcohol Use No     Social History   Substance and Sexual Activity  Drug Use Yes  . Types: Marijuana    Social History   Socioeconomic History  . Marital status: Single    Spouse name: Not on file  . Number of children: Not on file  . Years of education: Not on file  . Highest education level: Not on file  Occupational History  . Not on file  Social Needs  . Financial resource strain: Not on file  . Food insecurity:    Worry: Not on file    Inability: Not on file  . Transportation needs:    Medical: Not on file    Non-medical: Not on file  Tobacco Use  . Smoking status: Never Smoker  . Smokeless tobacco: Never Used  Substance and Sexual Activity  . Alcohol use: No  . Drug use: Yes    Types: Marijuana  . Sexual activity: Not Currently  Lifestyle  . Physical activity:    Days per week: Not on file    Minutes per session: Not on file  . Stress: Not on file   Relationships  . Social connections:    Talks on phone: Not on file    Gets together: Not on file    Attends religious service: Not on file    Active member of club or organization: Not on file    Attends meetings of clubs or organizations: Not on file    Relationship status: Not on file  Other Topics Concern  . Not on file  Social History Narrative  . Not on file   Additional Social History:                         Sleep: Fair  Appetite:  Fair  Current Medications: Current Facility-Administered Medications  Medication Dose Route Frequency Provider Last Rate Last Dose  . acetaminophen (TYLENOL) tablet 650 mg  650 mg Oral Q6H PRN [name], [name], MD      . alum & mag hydroxide-simeth (MAALOX/MYLANTA) 200-200-20 MG/5ML suspension 30 mL  30 mL Oral Q4H PRN [name], [name], MD      . cloZAPine (CLOZARIL) tablet 150 mg  150 mg Oral QHS Varian Innes B, MD   150 mg at 01/05/18 2116  . hydrOXYzine (ATARAX/VISTARIL) tablet 50 mg  50 mg Oral TID PRN [name], [name], MD   50 mg at 01/04/18 1125  .  magnesium hydroxide (MILK OF MAGNESIA) suspension 30 mL  30 mL Oral Daily PRN [name], [name], MD      . OLANZapine zydis (ZYPREXA) disintegrating tablet 20 mg  20 mg Oral QHS Damyra Luscher B, MD   20 mg at 01/05/18 2201   Or  . OLANZapine (ZYPREXA) injection 10 mg  10 mg Intramuscular QHS Rodney Yera B, MD      . OLANZapine (ZYPREXA) tablet 5 mg  5 mg Oral Q6H PRN Beverly Sessions, MD   5 mg at 01/04/18 1450   Or  . OLANZapine (ZYPREXA) injection 5 mg  5 mg Intramuscular Q6H PRN Beverly Sessions, MD      . traZODone (DESYREL) tablet 100 mg  100 mg Oral QHS PRN [name], [name], MD   100 mg at 01/05/18 2116    Lab Results: No results found for this or any previous visit (from the past 48 hour(s)).  Blood Alcohol level:  Lab Results  Component Value Date   ETH <10 12/30/2017   ETH <5 09/29/2016    Metabolic Disorder Labs: Lab Results  Component Value  Date   HGBA1C 4.6 (L) 12/30/2017   MPG 85.32 12/30/2017   No results found for: PROLACTIN Lab Results  Component Value Date   CHOL 185 12/30/2017   TRIG 125 12/30/2017   HDL 63 12/30/2017   CHOLHDL 2.9 12/30/2017   VLDL 25 12/30/2017   LDLCALC 97 12/30/2017    Physical Findings: AIMS:  , ,  ,  ,    CIWA:    COWS:     Musculoskeletal: Strength & Muscle Tone: within normal limits Gait & Station: normal Patient leans: N/A  Psychiatric Specialty Exam: Physical Exam  Nursing note and vitals reviewed. Psychiatric: His affect is blunt. His speech is delayed. He is slowed and withdrawn. Thought content is paranoid and delusional. Cognition and memory are impaired. He expresses impulsivity.    Review of Systems  Neurological: Negative.   Psychiatric/Behavioral: Positive for hallucinations.  All other systems reviewed and are negative.   Blood pressure (!) 114/92, pulse (!) 140, temperature 98.1 F (36.7 C), temperature source Oral, resp. rate 16, height 6\' 2"  (1.88 m), weight 83.5 kg (184 lb), SpO2 99 %.Body mass index is 23.62 kg/m.  General Appearance: Casual  Eye Contact:  Minimal  Speech:  Slow and Slurred  Volume:  Decreased  Mood:  Euthymic  Affect:  Flat  Thought Process:  Goal Directed and Descriptions of Associations: Intact  Orientation:  Full (Time, Place, and Person)  Thought Content:  Delusions and Paranoid Ideation  Suicidal Thoughts:  No  Homicidal Thoughts:  No  Memory:  Immediate;   Fair Recent;   Fair Remote;   Fair  Judgement:  Poor  Insight:  Lacking  Psychomotor Activity:  Psychomotor Retardation  Concentration:  Concentration: Fair and Attention Span: Fair  Recall:  [employer] of Knowledge:  Fair  Language:  Fair  Akathisia:  No  Handed:  Right  AIMS (if indicated):     Assets:  Communication Skills Desire for Improvement Financial Resources/Insurance Housing Physical Health Resilience Social Support  ADL's:  Intact  Cognition:  WNL   Sleep:  Number of Hours: 6.3     Treatment Plan Summary: Daily contact with patient to assess and evaluate symptoms and progress in treatment and Medication management   [name] [name] is a 22 year old male with a history of schizophrenia admitted for psychotic break in the context of treatment noncomplinace.On Zyprexa and Clozapine  titration.   #Psychosis, still floridly psychotic -continue Zyprexa zydis 15 mg and offer Zyprexa 10 mg IM if refused -continue Clozapine titration, continue 150 mg tonight   -Clozapine is slow to "kick in" and we will titrate slowly in fear of clozapine-induced myocarditis/cardiomyopathy most frequently afflicting young males. We realize that Zyprexa is not the best choice to combine with Clozapine but there are allergies to some antipsychotics  #Insomnia, improved with treatment -Trazodone 100 mg nightlyPRN only  #Labs -lipid panel, TSH and A1Care unremerkable -EKGreviewed, NSR with QTc 374  #Disposition -discharge to home with family -[facility] -needs information on [facility]     Kristine LineaJolanta Aoki Wedemeyer, MD 01/06/2018, 11:31 AM

## 2018-01-07 LAB — CBC WITH DIFFERENTIAL/PLATELET
Basophils Absolute: 0 10*3/uL (ref 0–0.1)
Basophils Relative: 0 %
Eosinophils Absolute: 0 10*3/uL (ref 0–0.7)
Eosinophils Relative: 0 %
HEMATOCRIT: 48.9 % (ref 40.0–52.0)
HEMOGLOBIN: 16.6 g/dL (ref 13.0–18.0)
LYMPHS ABS: 2 10*3/uL (ref 1.0–3.6)
Lymphocytes Relative: 48 %
MCH: 28.7 pg (ref 26.0–34.0)
MCHC: 34.1 g/dL (ref 32.0–36.0)
MCV: 84.2 fL (ref 80.0–100.0)
MONOS PCT: 11 %
Monocytes Absolute: 0.5 10*3/uL (ref 0.2–1.0)
NEUTROS PCT: 41 %
Neutro Abs: 1.8 10*3/uL (ref 1.4–6.5)
Platelets: 249 10*3/uL (ref 150–440)
RBC: 5.8 MIL/uL (ref 4.40–5.90)
RDW: 14 % (ref 11.5–14.5)
WBC: 4.3 10*3/uL (ref 3.8–10.6)

## 2018-01-07 MED ORDER — OLANZAPINE 10 MG IM SOLR: 10.0000 mg | Freq: Four times a day (QID) | INTRAMUSCULAR | Status: DC | PRN

## 2018-01-07 MED ORDER — METOPROLOL TARTRATE 25 MG PO TABS
12.5000 mg | ORAL_TABLET | Freq: Two times a day (BID) | ORAL | Status: DC
  Administered 2018-01-08 – 2018-01-29 (×41): 12.5 mg via ORAL
  Filled 2018-01-07 (×33): qty 1
  Filled 2018-01-07: qty 0.5
  Filled 2018-01-07 (×5): qty 1
  Filled 2018-01-07: qty 0.5
  Filled 2018-01-07 (×2): qty 1

## 2018-01-07 MED ORDER — CLOZAPINE 100 MG PO TABS
200.0000 mg | ORAL_TABLET | Freq: Every day | ORAL | Status: DC
  Administered 2018-01-07 – 2018-01-08 (×2): 200 mg via ORAL
  Filled 2018-01-07 (×2): qty 2

## 2018-01-07 MED ORDER — OLANZAPINE 10 MG PO TABS
10.0000 mg | ORAL_TABLET | Freq: Four times a day (QID) | ORAL | Status: DC | PRN
  Administered 2018-01-08 – 2018-01-10 (×3): 10 mg via ORAL
  Filled 2018-01-07 (×3): qty 1

## 2018-01-07 NOTE — Progress Notes (Addendum)
[facility] MD Progress Note  01/07/2018 9:30 AM Guy ZHAMIR PIRRO  MRN:  [id number]  Subjective:    Mr. Kittleson is up and out of his room today. He has been following me around demanding discharge, threatening to take legal action. He still refuses to talk to his mother stating that she "is crazy" and that his mother needs to be in the hospital. He took his medications today. The irritability is new. So far the patient has been calm even when refusing medications. He denies any symptoms of depression, anxiety or psychosis. He has been pacing a lot, especially in the afternoon.  The patient has been getting more agitated. PRNs in place.  Principal Problem: Schizophrenia, undifferentiated (HCC) Diagnosis:   Patient Active Problem List   Diagnosis Date Noted  . Schizophrenia, undifferentiated (HCC) [F20.3] 12/30/2017    Priority: High  . Noncompliance [Z91.19] 12/30/2017  . Aggressive behavior of adolescent [R46.89] 10/24/2015   Total Time spent with patient: 20 minutes  Past Psychiatric History: schizophrenia  Past Medical History:  Past Medical History:  Diagnosis Date  . Asthma   . Depression   . Psychosis [facility])     Past Surgical History:  Procedure Laterality Date  . BACK SURGERY     Family History: History reviewed. No pertinent family history. Family Psychiatric  History: none Social History:  Social History   Substance and Sexual Activity  Alcohol Use No     Social History   Substance and Sexual Activity  Drug Use Yes  . Types: Marijuana    Social History   Socioeconomic History  . Marital status: Single    Spouse name: Not on file  . Number of children: Not on file  . Years of education: Not on file  . Highest education level: Not on file  Occupational History  . Not on file  Social Needs  . Financial resource strain: Not on file  . Food insecurity:    Worry: Not on file    Inability: Not on file  . Transportation needs:    Medical: Not on file     Non-medical: Not on file  Tobacco Use  . Smoking status: Never Smoker  . Smokeless tobacco: Never Used  Substance and Sexual Activity  . Alcohol use: No  . Drug use: Yes    Types: Marijuana  . Sexual activity: Not Currently  Lifestyle  . Physical activity:    Days per week: Not on file    Minutes per session: Not on file  . Stress: Not on file  Relationships  . Social connections:    Talks on phone: Not on file    Gets together: Not on file    Attends religious service: Not on file    Active member of club or organization: Not on file    Attends meetings of clubs or organizations: Not on file    Relationship status: Not on file  Other Topics Concern  . Not on file  Social History Narrative  . Not on file   Additional Social History:                         Sleep: Fair  Appetite:  Poor  Current Medications: Current Facility-Administered Medications  Medication Dose Route Frequency Provider Last Rate Last Dose  . acetaminophen (TYLENOL) tablet 650 mg  650 mg Oral Q6H PRN [name], [name], MD      . alum & mag hydroxide-simeth (MAALOX/MYLANTA) 200-200-20 MG/5ML suspension  30 mL  30 mL Oral Q4H PRN [name], [name], MD      . cloZAPine (CLOZARIL) tablet 200 mg  200 mg Oral QHS Deno Sida B, MD      . hydrOXYzine (ATARAX/VISTARIL) tablet 50 mg  50 mg Oral TID PRN [name], [name], MD   50 mg at 01/04/18 1125  . magnesium hydroxide (MILK OF MAGNESIA) suspension 30 mL  30 mL Oral Daily PRN [name], [name], MD      . OLANZapine zydis (ZYPREXA) disintegrating tablet 20 mg  20 mg Oral QHS Jay Haskew B, MD   20 mg at 01/06/18 2126   Or  . OLANZapine (ZYPREXA) injection 10 mg  10 mg Intramuscular QHS Braxson Hollingsworth B, MD      . OLANZapine (ZYPREXA) tablet 5 mg  5 mg Oral Q6H PRN [name], [name], MD   5 mg at 01/04/18 1450   Or  . OLANZapine (ZYPREXA) injection 5 mg  5 mg Intramuscular Q6H PRN [name], [name], MD      . traZODone (DESYREL) tablet  100 mg  100 mg Oral QHS PRN [name], [name], MD   100 mg at 01/06/18 2126    Lab Results:  Results for orders placed or performed during the hospital encounter of 12/30/17 (from the past 48 hour(s))  CBC with Differential/Platelet     Status: None   Collection Time: 01/07/18  6:42 AM  Result Value Ref Range   WBC 4.3 3.8 - 10.6 K/uL   RBC 5.80 4.40 - 5.90 MIL/uL   Hemoglobin 16.6 13.0 - 18.0 g/dL   HCT 62.148.9 30.840.0 - 65.752.0 %   MCV 84.2 80.0 - 100.0 fL   MCH 28.7 26.0 - 34.0 pg   MCHC 34.1 32.0 - 36.0 g/dL   RDW 84.614.0 96.211.5 - 95.214.5 %   Platelets 249 150 - 440 K/uL   Neutrophils Relative % 41 %   Neutro Abs 1.8 1.4 - 6.5 K/uL   Lymphocytes Relative 48 %   Lymphs Abs 2.0 1.0 - 3.6 K/uL   Monocytes Relative 11 %   Monocytes Absolute 0.5 0.2 - 1.0 K/uL   Eosinophils Relative 0 %   Eosinophils Absolute 0.0 0 - 0.7 K/uL   Basophils Relative 0 %   Basophils Absolute 0.0 0 - 0.1 K/uL    Comment: Performed at [facility], [address] [place], [place] [id number]    Blood Alcohol level:  Lab Results  Component Value Date   [facility] <[facility] 12/30/2017   ETH <5 09/29/2016    Metabolic Disorder Labs: Lab Results  Component Value Date   HGBA1C 4.6 (L) 12/30/2017   MPG 85.32 12/30/2017   No results found for: PROLACTIN Lab Results  Component Value Date   CHOL 185 12/30/2017   TRIG 125 12/30/2017   HDL 63 12/30/2017   CHOLHDL 2.9 12/30/2017   VLDL 25 12/30/2017   LDLCALC 97 12/30/2017    Physical Findings: AIMS:  , ,  ,  ,    CIWA:    COWS:     Musculoskeletal: Strength & Muscle Tone: within normal limits Gait & Station: normal Patient leans: N/A  Psychiatric Specialty Exam: Physical Exam  Nursing note and vitals reviewed. Psychiatric: His affect is angry, blunt and inappropriate. His speech is rapid and/or pressured. He is actively hallucinating. Thought content is paranoid and delusional. Cognition and memory are normal. He expresses impulsivity.    Review of  Systems  Neurological: Negative.   Psychiatric/Behavioral: Positive  for hallucinations.  All other systems reviewed and are negative.   Blood pressure (!) 142/116, pulse (!) 129, temperature 97.6 F (36.4 C), temperature source Oral, resp. rate 18, height 6\' 2"  (1.88 m), weight 83.5 kg (184 lb), SpO2 100 %.Body mass index is 23.62 kg/m.  General Appearance: Casual  Eye Contact:  Good  Speech:  Clear and Coherent  Volume:  Increased  Mood:  Dysphoric and Irritable  Affect:  Congruent  Thought Process:  Goal Directed, Irrelevant and Descriptions of Associations: Tangential  Orientation:  Full (Time, Place, and Person)  Thought Content:  Delusions, Hallucinations: Auditory and Paranoid Ideation  Suicidal Thoughts:  No  Homicidal Thoughts:  No  Memory:  Immediate;   Fair Recent;   Fair Remote;   Fair  Judgement:  Poor  Insight:  Lacking  Psychomotor Activity:  Increased  Concentration:  Concentration: Fair and Attention Span: Fair  Recall:  [employer] of Knowledge:  Fair  Language:  Fair  Akathisia:  No  Handed:  Right  AIMS (if indicated):     Assets:  Communication Skills Desire for Improvement Financial Resources/Insurance Housing Physical Health Resilience Social Support  ADL's:  Intact  Cognition:  WNL  Sleep:  Number of Hours: 6.3     Treatment Plan Summary: Daily contact with patient to assess and evaluate symptoms and progress in treatment and Medication management   Mr. Muckey is a 22 year old male with a history of schizophrenia admitted for psychotic break in the context of treatment noncomplinace.On Zyprexa and Clozapine titration.   #Psychosis, still floridly psychotic -continue Zyprexa zydis 15 mg and offer Zyprexa 10 mg IM if refused -continue Clozapine titration, increased Clozapine to 200 mg tonight, ANC# 1.8  #Tachycardia and HTN with orthostasis -start Metoprolol 12.5 mg BID  #Insomnia, improved with treatment -Trazodone 100 mg nightlyPRN  only  #Labs -lipid panel, TSH and A1Care unremerkable -EKGreviewed, NSR with QTc 374  #Disposition -discharge to home with family -follow up with [facility] team -needs information on [facility] program    Kristine Linea, MD 01/07/2018, 9:30 AM

## 2018-01-07 NOTE — Progress Notes (Signed)
D: Emotional and mental status improving . Patient having periods of not complying with staff  , later in shift patient  will follow up .  Patient is not completely understanding information received . Staff attempted to  instructed patient  in concretely for  better understanding . Patient  continues to get frustrated easily  with staff . Patient non compliant with medication for HTN. Voice no concern around bowel elimination .  Moving in and out of groups not staying there  long .  Voice of feeling good  Upset this am  Voice to staff he was giving us 1 hour . Stated he wanted to go home . Voice of wanting to talk to Officer of the Law. Patient able to talk to officer  Hill able to talk to patient and calm him down. Patient refused to take his blood pressure medication   On this shift at 10:16 and 17:25 writer informed  Dr. Jennet MaduroPucilowska during shift  and Dr. Toni Amendlapacs @ 1805   Denies suicidal  homicidal ideations  . No pain concerns . Appropriate ADL'S. Interacting with peers and staff.  A: Encourage patient participation with unit programming . Instruction  Given on  Medication , verbalize understanding. R: Voice no other concerns. Staff continue to monitor

## 2018-01-07 NOTE — [facility] Group Notes (Signed)
01/07/2018 1PM  Type of Therapy/Topic:  Group Therapy:  Emotion Regulation  Participation Level:  Did Not Attend   Description of Group:   The purpose of this group is to assist patients in learning to regulate negative emotions and experience positive emotions. Patients will be guided to discuss ways in which they have been vulnerable to their negative emotions. These vulnerabilities will be juxtaposed with experiences of positive emotions or situations, and patients will be challenged to use positive emotions to combat negative ones. Special emphasis will be placed on coping with negative emotions in conflict situations, and patients will process healthy conflict resolution skills.  Therapeutic Goals: 1. Patient will identify two positive emotions or experiences to reflect on in order to balance out negative emotions 2. Patient will label two or more emotions that they find the most difficult to experience 3. Patient will demonstrate positive conflict resolution skills through discussion and/or role plays  Summary of Patient Progress: Patient was encouraged and invited to attend group. Patient did not attend group. Social worker will continue to encourage group participation in the future.        Therapeutic Modalities:   Cognitive Behavioral Therapy Feelings Identification Dialectical Behavioral Therapy   Daesha Insco, LCSW 01/07/2018 2:15 PM  

## 2018-01-07 NOTE — Plan of Care (Signed)
D: Pt denies SI/HI/AVH. Pt is labile and forwards little information to writer this evening. Pt got upset this evening walking around the unit cursing and talking to people not seen by staff. Pt originally refused to take his medications, then pt was convinced by staff to take his medication. Pt was in his room making noise and talking to people not there. Pt expressed his desire to be D/C'd . Pt was informed that the doctor was the only one that could let the pt go, but if he does not take his medications and he causes issues that would keep him here longer. Pt appeared to calm down and go to sleep after he was given his night [facility].   A: Pt was offered support and encouragement. Pt was given scheduled medications. Pt was encourage to attend groups. Q 15 minute checks were done for safety.   R:Pt attends groups and interacts minimally with peers and staff. Pt is taking medication. Pt receptive to treatment and safety maintained on unit.   Problem: Education: Goal: Emotional status will improve Outcome: Progressing   Problem: Education: Goal: Mental status will improve Outcome: Progressing   Problem: Coping: Goal: Ability to verbalize frustrations and anger appropriately will improve Outcome: Not Progressing   Problem: Coping: Goal: Ability to demonstrate self-control will improve Outcome: Not Progressing   Problem: Activity: Goal: Will verbalize the importance of balancing activity with adequate rest periods Outcome: Progressing   Problem: Coping: Goal: Coping ability will improve Outcome: Progressing

## 2018-01-07 NOTE — Progress Notes (Signed)
Clozapine monitoring Consult   22 yo male ordered clozapine 150 mg PO at bedtime.  New restart.  Patient has been off meds for a while.   06/19  ANC 1800  Information entered into Clozapine registry and pt eligible to receive clozapine Next labs due in a week - ordered for 06/26  Pharmacy will continue to follow.   Stormy CardKatsoudas,Aryssa Rosamond K, [facility] 01/07/2018 8:41 AM

## 2018-01-07 NOTE — Plan of Care (Signed)
Emotional and mental status improving . Patient having periods of not complying with staff  , later in shift patient  will follow up .  Patient is not completely understanding information received . Staff attempted to  instructed patient  in concretely for  better understanding . Patient  continues to get frustrated easily  with staff . Patient non compliant with medication for HTN. Voice no concern around bowel elimination .  Moving in and out of groups not staying there  long .  Voice of feeling good    Problem: Coping: Goal: Coping ability will improve Outcome: Not Progressing Goal: Will verbalize feelings Outcome: Not Progressing   Problem: Activity: Goal: Will verbalize the importance of balancing activity with adequate rest periods Outcome: Not Progressing   Problem: Elimination: Goal: Will not experience complications related to bowel motility Outcome: Progressing   Problem: Health Behavior/Discharge Planning: Goal: Identification of resources available to assist in meeting health care needs will improve Outcome: Not Progressing Goal: Compliance with treatment plan for underlying cause of condition will improve Outcome: Progressing   Problem: Coping: Goal: Ability to verbalize frustrations and anger appropriately will improve Outcome: Progressing   Problem: Education: Goal: Emotional status will improve Outcome: Progressing Goal: Mental status will improve Outcome: Progressing Goal: Verbalization of understanding the information provided will improve Outcome: Not Progressing

## 2018-01-07 NOTE — [facility] Group Notes (Signed)
[facility] Group Notes:  (Nursing/MHT/Case Management/Adjunct)  Date:  01/07/2018  Time:  9:20 PM  Type of Therapy:  Group Therapy  Participation Level:  Did Not Attend   [name] NeighborsKeith D Lashon [name] 01/07/2018, 9:20 PM

## 2018-01-07 NOTE — Progress Notes (Signed)
   01/07/18 0600  Clinical Encounter Type  Visited With Patient  Visit Type Initial  Referral From Nurse  Consult/Referral To Chaplain  Chaplain was PG stated Pt want to see Chaplain. [facility] her to Pt. PT asked Chaplain if she could talk to doctors about him going home. Chaplain told Pt that [facility] to see if [facility] could assist him. Chaplain was not sure. It seem that PT wanted Chaplain to tell the doctor to send him home. Chaplain did suggested that PT talk to therapist about his concerns.

## 2018-01-07 NOTE — Progress Notes (Signed)
Recreation Therapy Notes  Date: 06/19//2019  Time: 9:30 am   Location: Craft Room   Behavioral response: N/A   Intervention Topic: Team Work  Discussion/Intervention: Patient did not attend group.   Clinical Observations/Feedback:  Patient did not attend group.   Cheron Pasquarelli LRT/CTRS        [name] 01/07/2018 11:17 AM

## 2018-01-08 NOTE — [facility] Group Notes (Signed)
LCSW Group Therapy Note 01/08/2018 9:00 AM  Type of Therapy and Topic:  Group Therapy:  Setting Goals  Participation Level:  Did Not Attend  Description of Group: In this process group, patients discussed using strengths to work toward goals and address challenges.  Patients identified two positive things about themselves and one goal they were working on.  Patients were given the opportunity to share openly and support each other's plan for self-empowerment.  The group discussed the value of gratitude and were encouraged to have a daily reflection of positive characteristics or circumstances.  Patients were encouraged to identify a plan to utilize their strengths to work on current challenges and goals.  Therapeutic Goals 1. Patient will verbalize personal strengths/positive qualities and relate how these can assist with achieving desired personal goals 2. Patients will verbalize affirmation of peers plans for personal change and goal setting 3. Patients will explore the value of gratitude and positive focus as related to successful achievement of goals 4. Patients will verbalize a plan for regular reinforcement of personal positive qualities and circumstances.  Summary of Patient Progress:  Jousha was invited to today's group, but chose not to attend.     Therapeutic Modalities Cognitive Behavioral Therapy Motivational Interviewing    Alease FrameSonya S Collin Hendley, LCSW 01/08/2018 11:10 AM

## 2018-01-08 NOTE — Progress Notes (Signed)
[facility] MD Progress Note  01/08/2018 2:01 PM [name] [name]  MRN:  [id number]  Subjective:   [name] [name] is flotridly psychotic, talking loudly to his voices, irritable. Takes medications only with outmost encouragement. Still refusing to talk to his famoly.  Principal Problem: Schizophrenia, undifferentiated (HCC) Diagnosis:   Patient Active Problem List   Diagnosis Date Noted  . Schizophrenia, undifferentiated (HCC) [F20.3] 12/30/2017    Priority: High  . Noncompliance [Z91.19] 12/30/2017  . Aggressive behavior of adolescent [R46.89] 10/24/2015   Total Time spent with patient: 15 minutes  Past Psychiatric History: schizophrenia  Past Medical History:  Past Medical History:  Diagnosis Date  . Asthma   . Depression   . Psychosis [facility])     Past Surgical History:  Procedure Laterality Date  . BACK SURGERY     Family History: History reviewed. No pertinent family history. Family Psychiatric  History: none Social History:  Social History   Substance and Sexual Activity  Alcohol Use No     Social History   Substance and Sexual Activity  Drug Use Yes  . Types: Marijuana    Social History   Socioeconomic History  . Marital status: Single    Spouse name: Not on file  . Number of children: Not on file  . Years of education: Not on file  . Highest education level: Not on file  Occupational History  . Not on file  Social Needs  . Financial resource strain: Not on file  . Food insecurity:    Worry: Not on file    Inability: Not on file  . Transportation needs:    Medical: Not on file    Non-medical: Not on file  Tobacco Use  . Smoking status: Never Smoker  . Smokeless tobacco: Never Used  Substance and Sexual Activity  . Alcohol use: No  . Drug use: Yes    Types: Marijuana  . Sexual activity: Not Currently  Lifestyle  . Physical activity:    Days per week: Not on file    Minutes per session: Not on file  . Stress: Not on file  Relationships  . Social  connections:    Talks on phone: Not on file    Gets together: Not on file    Attends religious service: Not on file    Active member of club or organization: Not on file    Attends meetings of clubs or organizations: Not on file    Relationship status: Not on file  Other Topics Concern  . Not on file  Social History Narrative  . Not on file   Additional Social History:                         Sleep: Fair  Appetite:  Poor  Current Medications: Current Facility-Administered Medications  Medication Dose Route Frequency Provider Last Rate Last Dose  . acetaminophen (TYLENOL) tablet 650 mg  650 mg Oral Q6H PRN [name], [name], MD      . alum & mag hydroxide-simeth (MAALOX/MYLANTA) 200-200-20 MG/5ML suspension 30 mL  30 mL Oral Q4H PRN [name], [name], MD      . cloZAPine (CLOZARIL) tablet 200 mg  200 mg Oral QHS [name], [name], MD   200 mg at 01/07/18 2145  . hydrOXYzine (ATARAX/VISTARIL) tablet 50 mg  50 mg Oral TID PRN [name], [name], MD   50 mg at 01/04/18 1125  . magnesium hydroxide (MILK OF MAGNESIA) suspension 30 mL  30 mL Oral Daily PRN [name], [name], MD      . metoprolol tartrate (LOPRESSOR) tablet 12.5 mg  12.5 mg Oral BID [name], [name], MD      . OLANZapine zydis (ZYPREXA) disintegrating tablet 20 mg  20 mg Oral QHS [name], [name], MD   20 mg at 01/07/18 2145   Or  . OLANZapine (ZYPREXA) injection 10 mg  10 mg Intramuscular QHS [name], [name], MD      . OLANZapine (ZYPREXA) tablet 10 mg  10 mg Oral Q6H PRN [name], [name], MD       Or  . OLANZapine (ZYPREXA) injection 10 mg  10 mg Intramuscular Q6H PRN [name], [name], MD      . traZODone (DESYREL) tablet 100 mg  100 mg Oral QHS PRN [name], [name], MD   100 mg at 01/07/18 2145    Lab Results:  Results for orders placed or performed during the hospital encounter of 12/30/17 (from the past 48 hour(s))  CBC with Differential/Platelet     Status: None   Collection Time:  01/07/18  6:42 AM  Result Value Ref Range   WBC 4.3 3.8 - 10.6 K/uL   RBC 5.80 4.40 - 5.90 MIL/uL   Hemoglobin 16.6 13.0 - 18.0 g/dL   HCT 62.948.9 52.840.0 - 41.352.0 %   MCV 84.2 80.0 - 100.0 fL   MCH 28.7 26.0 - 34.0 pg   MCHC 34.1 32.0 - 36.0 g/dL   RDW 24.414.0 01.011.5 - 27.214.5 %   Platelets 249 150 - 440 K/uL   Neutrophils Relative % 41 %   Neutro Abs 1.8 1.4 - 6.5 K/uL   Lymphocytes Relative 48 %   Lymphs Abs 2.0 1.0 - 3.6 K/uL   Monocytes Relative 11 %   Monocytes Absolute 0.5 0.2 - 1.0 K/uL   Eosinophils Relative 0 %   Eosinophils Absolute 0.0 0 - 0.7 K/uL   Basophils Relative 0 %   Basophils Absolute 0.0 0 - 0.1 K/uL    Comment: Performed at [facility], [address] [place], [place] [address]    Blood Alcohol level:  Lab Results  Component Value Date   [facility] 12/30/2017   ETH <5 09/29/2016    Metabolic Disorder Labs: Lab Results  Component Value Date   HGBA1C 4.6 (L) 12/30/2017   MPG 85.32 12/30/2017   No results found for: PROLACTIN Lab Results  Component Value Date   CHOL 185 12/30/2017   TRIG 125 12/30/2017   HDL 63 12/30/2017   CHOLHDL 2.9 12/30/2017   VLDL 25 12/30/2017   LDLCALC 97 12/30/2017    Physical Findings: AIMS:  , ,  ,  ,    CIWA:    COWS:     Musculoskeletal: Strength & Muscle Tone: within normal limits Gait & Station: normal Patient leans: N/A  Psychiatric Specialty Exam: Physical Exam  Nursing note and vitals reviewed. Psychiatric: His affect is angry, labile and inappropriate. His speech is rapid and/or pressured. He is hyperactive and actively hallucinating. Thought content is paranoid and delusional. Cognition and memory are impaired. He expresses impulsivity.    Review of Systems  Musculoskeletal: Back pain:    Neurological: Negative.   Psychiatric/Behavioral: Positive for hallucinations.  All other systems reviewed and are negative.   Blood pressure 109/90, pulse 79, temperature 97.6 F (36.4 C), temperature source  Oral, resp. rate 18, height 6\' 2"  (1.88 m), weight 83.5 kg (184 lb), SpO2 100 %.Body mass index  is 23.62 kg/m.  General Appearance: Casual  Eye Contact:  Good  Speech:  Pressured  Volume:  Increased  Mood:  Angry, Dysphoric and Irritable  Affect:  Congruent  Thought Process:  Goal Directed and Descriptions of Associations: Intact  Orientation:  Full (Time, Place, and Person)  Thought Content:  Delusions, Hallucinations: Auditory, Paranoid Ideation and Rumination  Suicidal Thoughts:  No  Homicidal Thoughts:  No  Memory:  Immediate;   Fair Recent;   Fair Remote;   Fair  Judgement:  Poor  Insight:  Lacking  Psychomotor Activity:  Increased  Concentration:  Concentration: Fair and Attention Span: Fair  Recall:  [employer] of Knowledge:  Fair  Language:  Fair  Akathisia:  No  Handed:  Right  AIMS (if indicated):     Assets:  Communication Skills Desire for Improvement Financial Resources/Insurance Housing Physical Health Resilience Social Support  ADL's:  Intact  Cognition:  WNL  Sleep:  Number of Hours: 7.45     Treatment Plan Summary: Daily contact with patient to assess and evaluate symptoms and progress in treatment and Medication management   Mr. Ralph is a 22 year old male with a history of schizophrenia admitted for psychotic break in the context of treatment noncomplinace.On Zyprexa and Clozapine titration.   #Psychosis, still floridly psychotic -continue Zyprexa zydis 15 mg and offer Zyprexa 10 mg IM if refused -continue Clozapine titration,continue Clozapine to 200 mg nightly, ANC# 1.8  #Tachycardia and HTN with orthostasis, improved -start Metoprolol 12.5 mg BID  #Insomnia, improved with treatment -Trazodone 100 mg nightlyPRN only  #Labs -lipid panel, TSH and A1Care unremerkable -EKGreviewed, NSR with QTc 374  #Disposition -discharge to home with family -follow up with [facility] team -needs information on [facility]  program    Kristine Linea, MD 01/08/2018, 2:01 PM

## 2018-01-08 NOTE — Plan of Care (Signed)
Patient is alert to self and place. Patient has mild confusion, and paces the hallways. This morning patient slept until after 2 pm and did not want to get up to take blood pressure medication. Patient very irritable, continues to states " I am ready to go home." Patient did not attend groups today. Lunch tray was saved and patient ate when he woke up. Patient is not progressing in activity, not able to express/ verbalize feelings to staff. Nurse will continue to monitor. Problem: Health Behavior/Discharge Planning: Goal: Identification of resources available to assist in meeting health care needs will improve Outcome: Not Progressing Goal: Compliance with treatment plan for underlying cause of condition will improve Outcome: Not Progressing   Problem: Elimination: Goal: Will not experience complications related to bowel motility Outcome: Not Progressing   Problem: Activity: Goal: Will verbalize the importance of balancing activity with adequate rest periods Outcome: Not Progressing

## 2018-01-08 NOTE — Progress Notes (Signed)
Recreation Therapy Notes  Date: 01/08/2018  Time: 9:30 am   Location: Room 21   Behavioral response: N/A   Intervention Topic: Animal Assisted Therapy  Discussion/Intervention: Patient did not attend group.   Clinical Observations/Feedback:  Patient did not attend group.   Lona Six LRT/CTRS        Peola Joynt 01/08/2018 10:38 AM 

## 2018-01-08 NOTE — Plan of Care (Addendum)
Patient found sleeping in bed upon my arrival. Patient is neither visible nor social this evening. Patient refused to acknowledge writer upon entering his room, pretended not to hear me. Patient came out for snack and agreed to take medications from male RN. Refused to answer assessment questions. Guarded and irritable throughout our interactions. Q 15 minute checks maintained. Will continue to monitor throughout the shift. Patient slept 6.75 hours. No apparent distress. Compliant with am VS. Will endorse care to oncoming shift.  Problem: Coping: Goal: Ability to demonstrate self-control will improve Outcome: Progressing   Problem: Education: Goal: Emotional status will improve Outcome: Not Progressing Goal: Mental status will improve Outcome: Not Progressing Goal: Verbalization of understanding the information provided will improve Outcome: Not Progressing   Problem: Coping: Goal: Coping ability will improve Outcome: Not Progressing Goal: Will verbalize feelings Outcome: Not Progressing

## 2018-01-08 NOTE — [facility] Group Notes (Signed)
[facility] Group Notes:  (Nursing/MHT/Case Management/Adjunct)  Date:  01/08/2018  Time:  10:21 PM  Type of Therapy:  Group Therapy  Participation Level:  Did Not Attend   [name] [name]  [place] 01/08/2018, 10:21 PM

## 2018-01-08 NOTE — [facility] Group Notes (Signed)
  01/08/2018  Time: 1PM  Type of Therapy/Topic:  Group Therapy:  Balance in Life  Participation Level:  Did Not Attend  Description of Group:   This group will address the concept of balance and how it feels and looks when one is unbalanced. Patients will be encouraged to process areas in their lives that are out of balance and identify reasons for remaining unbalanced. Facilitators will guide patients in utilizing problem-solving interventions to address and correct the stressor making their life unbalanced. Understanding and applying boundaries will be explored and addressed for obtaining and maintaining a balanced life. Patients will be encouraged to explore ways to assertively make their unbalanced needs known to significant others in their lives, using other group members and facilitator for support and feedback.  Therapeutic Goals: 1. Patient will identify two or more emotions or situations they have that consume much of in their lives. 2. Patient will identify signs/triggers that life has become out of balance:  3. Patient will identify two ways to set boundaries in order to achieve balance in their lives:  4. Patient will demonstrate ability to communicate their needs through discussion and/or role plays  Summary of Patient Progress: Pt was invited to attend group but chose not to attend. CSW will continue to encourage pt to attend group throughout their admission.     Therapeutic Modalities:   Cognitive Behavioral Therapy Solution-Focused Therapy Assertiveness Training  Heidi DachKelsey Gabriellah Rabel, MSW, LCSW Clinical Social Worker 01/08/2018 1:53 PM

## 2018-01-09 MED ORDER — CLOZAPINE 25 MG PO TABS
250.0000 mg | ORAL_TABLET | Freq: Every day | ORAL | Status: DC
  Administered 2018-01-09 – 2018-01-11 (×3): 250 mg via ORAL
  Filled 2018-01-09 (×3): qty 2

## 2018-01-09 NOTE — Tx Team (Signed)
Interdisciplinary Treatment and Diagnostic Plan Update  01/09/2018 Time of Session: 10:30am [name] [name] MRN: [id number]  Principal Diagnosis: Schizophrenia, undifferentiated (HCC)  Secondary Diagnoses: Principal Problem:   Schizophrenia, undifferentiated (HCC) Active Problems:   Noncompliance   Current Medications:  Current Facility-Administered Medications  Medication Dose Route Frequency Provider Last Rate Last Dose  . acetaminophen (TYLENOL) tablet 650 mg  650 mg Oral Q6H PRN [name], [name], MD      . alum & mag hydroxide-simeth (MAALOX/MYLANTA) 200-200-20 MG/5ML suspension 30 mL  30 mL Oral Q4H PRN [name], [name], MD      . cloZAPine (CLOZARIL) tablet 200 mg  200 mg Oral QHS [name], [name], MD   200 mg at 01/08/18 2146  . hydrOXYzine (ATARAX/VISTARIL) tablet 50 mg  50 mg Oral TID PRN [name], [name], MD   50 mg at 01/08/18 1701  . magnesium hydroxide (MILK OF MAGNESIA) suspension 30 mL  30 mL Oral Daily PRN [name], [name], MD      . metoprolol tartrate (LOPRESSOR) tablet 12.5 mg  12.5 mg Oral BID [name], [name], MD   12.5 mg at 01/09/18 0843  . OLANZapine zydis (ZYPREXA) disintegrating tablet 20 mg  20 mg Oral QHS [name], [name], MD   20 mg at 01/08/18 2146   Or  . OLANZapine (ZYPREXA) injection 10 mg  10 mg Intramuscular QHS [name], [name], MD      . OLANZapine (ZYPREXA) tablet 10 mg  10 mg Oral Q6H PRN [name], [name], MD   10 mg at 01/08/18 1701   Or  . OLANZapine (ZYPREXA) injection 10 mg  10 mg Intramuscular Q6H PRN [name], [name], MD      . traZODone (DESYREL) tablet 100 mg  100 mg Oral QHS PRN [name], [name], MD   100 mg at 01/08/18 2146   PTA Medications: Medications Prior to Admission  Medication Sig Dispense Refill Last Dose  . doxycycline (VIBRA-TABS) 100 MG tablet Take 1 tablet (100 mg total) by mouth 2 (two) times daily. (Patient not taking: Reported on 12/30/2017) 19 tablet 0 Not Taking at Unknown time  .  HYDROcodone-acetaminophen (NORCO) 5-325 MG tablet Take 1 tablet by mouth every 6 (six) hours as needed. (Patient not taking: Reported on 12/30/2017) 10 tablet 0 Not Taking at Unknown time    Patient Stressors: Medication change or noncompliance  Patient Strengths: Capable of independent living Communication skills Physical Health Supportive family/friends  Treatment Modalities: Medication Management, Group therapy, Case management,  1 to 1 session with clinician, Psychoeducation, Recreational therapy.   Physician Treatment Plan for Primary Diagnosis: Schizophrenia, undifferentiated (HCC) Long Term Goal(s): Improvement in symptoms so as ready for discharge NA   Short Term Goals: Ability to identify changes in lifestyle to reduce recurrence of condition will improve Ability to verbalize feelings will improve Ability to disclose and discuss suicidal ideas Ability to demonstrate self-control will improve Ability to identify and develop effective coping behaviors will improve Ability to maintain clinical measurements within normal limits will improve Compliance with prescribed medications will improve Ability to identify triggers associated with substance abuse/mental health issues will improve NA  Medication Management: Evaluate patient's response, side effects, and tolerance of medication regimen.  Therapeutic Interventions: 1 to 1 sessions, Unit Group sessions and Medication administration.  Evaluation of Outcomes: Progressing  Physician Treatment Plan for Secondary Diagnosis: Principal Problem:   Schizophrenia, undifferentiated (HCC) Active Problems:   Noncompliance  Long Term Goal(s): Improvement in symptoms so as ready for  discharge NA   Short Term Goals: Ability to identify changes in lifestyle to reduce recurrence of condition will improve Ability to verbalize feelings will improve Ability to disclose and discuss suicidal ideas Ability to demonstrate self-control will  improve Ability to identify and develop effective coping behaviors will improve Ability to maintain clinical measurements within normal limits will improve Compliance with prescribed medications will improve Ability to identify triggers associated with substance abuse/mental health issues will improve NA     Medication Management: Evaluate patient's response, side effects, and tolerance of medication regimen.  Therapeutic Interventions: 1 to 1 sessions, Unit Group sessions and Medication administration.  Evaluation of Outcomes: Progressing   RN Treatment Plan for Primary Diagnosis: Schizophrenia, undifferentiated (HCC) Long Term Goal(s): Knowledge of disease and therapeutic regimen to maintain health will improve  Short Term Goals: Ability to verbalize feelings will improve, Ability to identify and develop effective coping behaviors will improve and Compliance with prescribed medications will improve  Medication Management: RN will administer medications as ordered by provider, will assess and evaluate patient's response and provide education to patient for prescribed medication. RN will report any adverse and/or side effects to prescribing provider.  Therapeutic Interventions: 1 on 1 counseling sessions, Psychoeducation, Medication administration, Evaluate responses to treatment, Monitor vital signs and CBGs as ordered, Perform/monitor CIWA, COWS, AIMS and Fall Risk screenings as ordered, Perform wound care treatments as ordered.  Evaluation of Outcomes: Progressing   LCSW Treatment Plan for Primary Diagnosis: Schizophrenia, undifferentiated (HCC) Long Term Goal(s): Safe transition to appropriate next level of care at discharge, Engage patient in therapeutic group addressing interpersonal concerns.  Short Term Goals: Engage patient in aftercare planning with referrals and resources, Increase social support, Facilitate acceptance of mental health diagnosis and concerns, Identify triggers  associated with mental health/substance abuse issues and Increase skills for wellness and recovery  Therapeutic Interventions: Assess for all discharge needs, 1 to 1 time with Social worker, Explore available resources and support systems, Assess for adequacy in community support network, Educate family and significant other(s) on suicide prevention, Complete Psychosocial Assessment, Interpersonal group therapy.  Evaluation of Outcomes: Progressing   Progress in Treatment: Attending groups: No. Participating in groups: No. Taking medication as prescribed: Yes. Toleration medication: Yes. Family/Significant other contact made: No, will contact:  Patient refused Patient understands diagnosis: Yes. Discussing patient identified problems/goals with staff: Yes. Medical problems stabilized or resolved: Yes. Denies suicidal/homicidal ideation: Yes. Issues/concerns per patient self-inventory: No. Other:   New problem(s) identified: No, Describe:  None  New Short Term/Long Term Goal(s): *Patient did not attend.*  Patient Goals:  *Patient did not attend.*  Discharge Plan or Barriers: To return home and follow up with outpatient treatment.  Reason for Continuation of Hospitalization: Medication stabilization  Estimated Length of Stay: 7 days  Recreational Therapy: Patient Stressors: N/A  Patient Goal: Patient will engage in interactions with peers and staff in pro-social manner at least 2x within 5 recreation therapy group sessions  Attendees: Patient: 01/09/2018 11:29 AM  Physician: Dr. Jennet Maduro, MD 01/09/2018 11:29 AM  Nursing:Phyllis Allison Quarry, RN 01/09/2018 11:29 AM  RN Care Manager: 01/09/2018 11:29 AM  Social Worker: [name], LCSWA 01/09/2018 11:29 AM  Recreational Therapist: Danella Deis. Dreama Saa, LRT 01/09/2018 11:29 AM  Other: Heidi Dach, LCSW 01/09/2018 11:29 AM  Other: Damian Leavell, Chaplin 01/09/2018 11:29 AM  Other: Lamar Laundry Carter,LCSW 01/09/2018 11:29 AM    Scribe for  Treatment Team: [name], LCSW 01/09/2018 11:29 AM

## 2018-01-09 NOTE — [facility] Group Notes (Signed)
[facility] Group Notes:  (Nursing/MHT/Case Management/Adjunct)  Date:  01/09/2018  Time:  9:25 PM  Type of Therapy:  Group Therapy  Participation Level:  Active  Participation Quality:  Appropriate  Affect:  Appropriate  Cognitive:  Disorganized  Insight:  Appropriate  Engagement in Group:  Improving  Modes of Intervention:  Discussion  Summary of Progress/Problems:  [name] NeighborsKeith D Cayman [name] 01/09/2018, 9:25 PM

## 2018-01-09 NOTE — Progress Notes (Signed)
[facility] MD Progress Note  01/09/2018 5:08 PM [name] [name]  MRN:  [id number]  Subjective:    [name] [name] is no longer argumentative and angry. He however is still hallucinating, paranoid, delusional and hateful towards his mother. Takes medications from male nurses only. Refuses to talk to her. Forbids me from talking.   Spoke with the mother who is very anxious and concern. She understands that the patient is not ready for visitation.    Principal Problem: Schizophrenia, undifferentiated (HCC) Diagnosis:   Patient Active Problem List   Diagnosis Date Noted  . Schizophrenia, undifferentiated (HCC) [F20.3] 12/30/2017    Priority: High  . Noncompliance [Z91.19] 12/30/2017  . Aggressive behavior of adolescent [R46.89] 10/24/2015   Total Time spent with patient: 15 minutes  Past Psychiatric History: schizophrenia  Past Medical History:  Past Medical History:  Diagnosis Date  . Asthma   . Depression   . Psychosis [facility])     Past Surgical History:  Procedure Laterality Date  . BACK SURGERY     Family History: History reviewed. No pertinent family history. Family Psychiatric  History: none Social History:  Social History   Substance and Sexual Activity  Alcohol Use No     Social History   Substance and Sexual Activity  Drug Use Yes  . Types: Marijuana    Social History   Socioeconomic History  . Marital status: Single    Spouse name: Not on file  . Number of children: Not on file  . Years of education: Not on file  . Highest education level: Not on file  Occupational History  . Not on file  Social Needs  . Financial resource strain: Not on file  . Food insecurity:    Worry: Not on file    Inability: Not on file  . Transportation needs:    Medical: Not on file    Non-medical: Not on file  Tobacco Use  . Smoking status: Never Smoker  . Smokeless tobacco: Never Used  Substance and Sexual Activity  . Alcohol use: No  . Drug use: Yes    Types: Marijuana  .  Sexual activity: Not Currently  Lifestyle  . Physical activity:    Days per week: Not on file    Minutes per session: Not on file  . Stress: Not on file  Relationships  . Social connections:    Talks on phone: Not on file    Gets together: Not on file    Attends religious service: Not on file    Active member of club or organization: Not on file    Attends meetings of clubs or organizations: Not on file    Relationship status: Not on file  Other Topics Concern  . Not on file  Social History Narrative  . Not on file   Additional Social History:                         Sleep: Fair  Appetite:  Fair  Current Medications: Current Facility-Administered Medications  Medication Dose Route Frequency Provider Last Rate Last Dose  . acetaminophen (TYLENOL) tablet 650 mg  650 mg Oral Q6H PRN [name], [name], MD      . alum & mag hydroxide-simeth (MAALOX/MYLANTA) 200-200-20 MG/5ML suspension 30 mL  30 mL Oral Q4H PRN [name], [name], MD      . cloZAPine (CLOZARIL) tablet 200 mg  200 mg Oral QHS Georgeana Oertel B, MD   200 mg at  01/08/18 2146  . hydrOXYzine (ATARAX/VISTARIL) tablet 50 mg  50 mg Oral TID PRN [name], [name], MD   50 mg at 01/08/18 1701  . magnesium hydroxide (MILK OF MAGNESIA) suspension 30 mL  30 mL Oral Daily PRN [name], [name], MD      . metoprolol tartrate (LOPRESSOR) tablet 12.5 mg  12.5 mg Oral BID Dejia Ebron B, MD   12.5 mg at 01/09/18 1703  . OLANZapine zydis (ZYPREXA) disintegrating tablet 20 mg  20 mg Oral QHS Natalya Domzalski B, MD   20 mg at 01/08/18 2146   Or  . OLANZapine (ZYPREXA) injection 10 mg  10 mg Intramuscular QHS Nizhoni Parlow B, MD      . OLANZapine (ZYPREXA) tablet 10 mg  10 mg Oral Q6H PRN Afia Messenger B, MD   10 mg at 01/08/18 1701   Or  . OLANZapine (ZYPREXA) injection 10 mg  10 mg Intramuscular Q6H PRN Keiston Manley B, MD      . traZODone (DESYREL) tablet 100 mg  100 mg Oral QHS PRN [name], [name],  MD   100 mg at 01/08/18 2146    Lab Results: No results found for this or any previous visit (from the past 48 hour(s)).  Blood Alcohol level:  Lab Results  Component Value Date   ETH <10 12/30/2017   ETH <5 09/29/2016    Metabolic Disorder Labs: Lab Results  Component Value Date   HGBA1C 4.6 (L) 12/30/2017   MPG 85.32 12/30/2017   No results found for: PROLACTIN Lab Results  Component Value Date   CHOL 185 12/30/2017   TRIG 125 12/30/2017   HDL 63 12/30/2017   CHOLHDL 2.9 12/30/2017   VLDL 25 12/30/2017   LDLCALC 97 12/30/2017    Physical Findings: AIMS:  , ,  ,  ,    CIWA:    COWS:     Musculoskeletal: Strength & Muscle Tone: within normal limits Gait & Station: normal Patient leans: N/A  Psychiatric Specialty Exam: Physical Exam  Nursing note and vitals reviewed. Psychiatric: His speech is normal. His affect is blunt. He is withdrawn and actively hallucinating. Thought content is paranoid and delusional. Cognition and memory are normal. He expresses impulsivity.    Review of Systems  Neurological: Negative.   Psychiatric/Behavioral: Positive for hallucinations.  All other systems reviewed and are negative.   Blood pressure 136/78, pulse 98, temperature 98.1 F (36.7 C), temperature source Oral, resp. rate 16, height 6\' 2"  (1.88 m), weight 83.5 kg (184 lb), SpO2 100 %.Body mass index is 23.62 kg/m.  General Appearance: Casual  Eye Contact:  Good  Speech:  Clear and Coherent  Volume:  Normal  Mood:  Dysphoric  Affect:  Flat  Thought Process:  Irrelevant and Descriptions of Associations: Tangential  Orientation:  Full (Time, Place, and Person)  Thought Content:  Delusions, Hallucinations: Auditory and Paranoid Ideation  Suicidal Thoughts:  No  Homicidal Thoughts:  No  Memory:  Immediate;   Fair Recent;   Fair Remote;   Fair  Judgement:  Poor  Insight:  Lacking  Psychomotor Activity:  Psychomotor Retardation  Concentration:  Concentration: Fair and  Attention Span: Fair  Recall:  FiservFair  Fund of Knowledge:  Fair  Language:  Fair  Akathisia:  No  Handed:  Right  AIMS (if indicated):     Assets:  Communication Skills Desire for Improvement Financial Resources/Insurance Housing Physical Health Resilience Social Support  ADL's:  Intact  Cognition:  WNL  Sleep:  Number of Hours: 6.75     Treatment Plan Summary: Daily contact with patient to assess and evaluate symptoms and progress in treatment and Medication management   Mr. Escandon is a 22 year old male with a history of schizophrenia admitted for psychotic break in the context of treatment noncomplinace. He is still on Zyprexa and Clozapine titration.   #Psychosis, still floridly psychotic -continue Zyprexa zydis 15 mg nightly -continue Clozapine titration,increase Clozapine to 250 mg nightly tonight 6/21  #Tachycardia and HTN, improved -continue start Metoprolol12.5 mg BID  #Insomnia, improved with treatment -Trazodone 100 mg nightlyPRN only  #Labs -lipid panel, TSH and A1Care unremerkable -EKGreviewed, NSR with QTc 374  #Disposition -discharge to home with family -[facility] -unable to absorb information on [facility] program    Kristine Linea, MD 01/09/2018, 5:08 PM

## 2018-01-09 NOTE — [facility] Group Notes (Signed)
01/09/2018 1PM  Type of Therapy and Topic:  Group Therapy:  Feelings around Relapse and Recovery  Participation Level:  Did Not Attend   Description of Group:    Patients in this group will discuss emotions they experience before and after a relapse. They will process how experiencing these feelings, or avoidance of experiencing them, relates to having a relapse. Facilitator will guide patients to explore emotions they have related to recovery. Patients will be encouraged to process which emotions are more powerful. They will be guided to discuss the emotional reaction significant others in their lives may have to patients' relapse or recovery. Patients will be assisted in exploring ways to respond to the emotions of others without this contributing to a relapse.  Therapeutic Goals: 1. Patient will identify two or more emotions that lead to a relapse for them 2. Patient will identify two emotions that result when they relapse 3. Patient will identify two emotions related to recovery 4. Patient will demonstrate ability to communicate their needs through discussion and/or role plays   Summary of Patient Progress: Patient was encouraged and invited to attend group. Patient did not attend group. Social worker will continue to encourage group participation in the future.      Therapeutic Modalities:   Cognitive Behavioral Therapy Solution-Focused Therapy Assertiveness Training Relapse Prevention Therapy   Elfego Giammarino, LCSW 01/09/2018 1:49 PM    

## 2018-01-09 NOTE — Progress Notes (Signed)
Recreation Therapy Notes  Date: 01/09/2018  Time: 9:30 am   Location: Craft room   Behavioral response: N/A   Intervention Topic: Problem Solving  Discussion/Intervention: Patient did not attend group.   Clinical Observations/Feedback:  Patient did not attend group.   [name] LRT/CTRS        [name] [name] 01/09/2018 10:26 AM 

## 2018-01-09 NOTE — Plan of Care (Signed)
Patient stayed in bed most of the shift.Denies SI,HI and AVH.Pleasant and cooperative on approach.Compliant with medications.Appetite and energy level good.Did not attend groups.Support and encouragement given.

## 2018-01-10 NOTE — Plan of Care (Signed)
Pt continues to pace around the unit. Pt medication compliant. Pt denies SI/HI. Pt is receptive to treatment and safety maintained on unit. Will continue to monitor. Problem: Education: Goal: Knowledge of [facility] Education information/materials will improve Outcome: Progressing Goal: Mental status will improve Outcome: Progressing Goal: Verbalization of understanding the information provided will improve Outcome: Progressing   Problem: Coping: Goal: Ability to verbalize frustrations and anger appropriately will improve Outcome: Progressing   Problem: Health Behavior/Discharge Planning: Goal: Compliance with treatment plan for underlying cause of condition will improve Outcome: Progressing   Problem: Elimination: Goal: Will not experience complications related to bowel motility Outcome: Progressing   Problem: Coping: Goal: Will verbalize feelings Outcome: Progressing

## 2018-01-10 NOTE — Plan of Care (Signed)
Patient is alert and oriented, denies SI, HI and AVH. Patient complains of malaise this morning and wanting to sleep. Patient ate 90% of his breakfast and returned to sleep. Patient participated in last nights group meeting. Nurse will encourage participation in social worker group meeting. No complaints from patient this morning. Nurse will continue to monitor. Problem: Education: Goal: Knowledge of [facility] Education information/materials will improve Outcome: Not Progressing Goal: Emotional status will improve Outcome: Not Progressing Goal: Mental status will improve Outcome: Not Progressing Goal: Verbalization of understanding the information provided will improve Outcome: Not Progressing   Problem: Coping: Goal: Ability to verbalize frustrations and anger appropriately will improve Outcome: Not Progressing Goal: Ability to demonstrate self-control will improve Outcome: Progressing   Problem: Health Behavior/Discharge Planning: Goal: Identification of resources available to assist in meeting health care needs will improve Outcome: Not Progressing Goal: Compliance with treatment plan for underlying cause of condition will improve Outcome: Progressing

## 2018-01-10 NOTE — [facility] Group Notes (Signed)
[facility] Group Notes:  (Nursing/MHT/Case Management/Adjunct)  Date:  01/10/2018  Time:  10:18 PM  Type of Therapy:  Group Therapy  Participation Level:  Active  Participation Quality:  Appropriate  Affect:  Appropriate  Cognitive:  Appropriate  Insight:  Improving  Engagement in Group:  Engaged  Modes of Intervention:  Discussion  Summary of Progress/Problems: Group reviewed rules of unit and expectations. Group made introductions and discussed goal for the day. Group addressed barriers that hindered progress of goals. Group discussed coping skills learned while on the unit and how group could utilize when returning to natural setting. Allenmichael shared in group his goal today. Lindbergh stated he wanted to be more positive and attend groups. Dang stated he accomplished his goal today. Alvah stated he has not learned any coping skills he will be able to utilize at home. Jinger NeighborsKeith D Ishitha Roper 01/10/2018, 10:18 PM

## 2018-01-10 NOTE — [facility] Group Notes (Signed)
LCSW Group Therapy Note  01/10/2018 1:15pm  Type of Therapy and Topic:  Group Therapy:  Cognitive Distortions  Participation Level:  Active   Description of Group:    Patients in this group will be introduced to the topic of cognitive distortions.  Patients will identify and describe cognitive distortions, describe the feelings these distortions create for them.  Patients will identify one or more situations in their personal life where they have cognitively distorted thinking and will verbalize challenging this cognitive distortion through positive thinking skills.  Patients will practice the skill of using positive affirmations to challenge cognitive distortions using affirmation cards.    Therapeutic Goals:  1. Patient will identify two or more cognitive distortions they have used 2. Patient will identify one or more emotions that stem from use of a cognitive distortion 3. Patient will demonstrate use of a positive affirmation to counter a cognitive distortion through discussion and/or role play. 4. Patient will describe one way cognitive distortions can be detrimental to wellness   Summary of Patient Progress: The patient reported that he feels "good." Patients were introduced to the topic of cognitive distortions. The patient was able to identify and describe cognitive distortions and the feelings these distortions create for him. The patient indicated that his most used unhelpful thinking style is magnification (catastrophizing). Patient identified a situation in his personal life where he has cognitively distorted thinking and verbalized and challenged this cognitive distortion through positive thinking skills. Patient was able to provide support and validation to other group members       Therapeutic Modalities:   Cognitive Behavioral Therapy Motivational Interviewing   Caydee Talkington  CUEBAS-COLON, LCSW 01/10/2018 10:04 AM

## 2018-01-10 NOTE — Plan of Care (Addendum)
Patient found in day room upon my arrival. Patient is visible and somewhat social this evening. Patient mood and affect is improved. Patient is not as menacing and isolative as he was 48 hours ago. Complains of restlessness and agitation. Paces the unit until 2300. Denies SI/HI/AVH. Complains of boredom. Attends group. Reports eating and voiding adequately. Eats snack and watches TV with peers for part of the evening. Processing and ablity to engage in conversation is improved. Compliant with HS medications @2100  but remains agitated and unable to lie down until 2300. Given PRN Zyprexa 10mg  PO and Vistaril 50mg . Will monitor for efficacy. Compliance with unit routine and staff direction improved. Q 15 minute checks maintained. Will continue to monitor throughout the shift. Patient slept 6.5 hours. No apparent distress. Will endorse care to oncoming shift.   Problem: Education: Goal: Emotional status will improve Outcome: Progressing Goal: Mental status will improve Outcome: Progressing   Problem: Coping: Goal: Ability to verbalize frustrations and anger appropriately will improve Outcome: Progressing Goal: Ability to demonstrate self-control will improve Outcome: Progressing   Problem: Health Behavior/Discharge Planning: Goal: Compliance with treatment plan for underlying cause of condition will improve Outcome: Progressing

## 2018-01-10 NOTE — Progress Notes (Signed)
[facility] MD Progress Note  01/10/2018 1:48 PM [name] [name]  MRN:  [id number] Subjective: Follow-up for 22 year old man with schizophrenia.  Patient tells me that he is feeling a little better today.  Mostly feeling bored at being in the hospital and wishing he could get out of here.  He is aware that Dr. Demetrius Charity has suggested his going to [facility] after discharge.  I reinforced this as being probably an excellent plan for him.  He does seem to be less psychotic and more reasonable in his thinking.  He is not agitated or trying to run off the unit regularly.  His hygiene is good. Principal Problem: Schizophrenia, undifferentiated (HCC) Diagnosis:   Patient Active Problem List   Diagnosis Date Noted  . Schizophrenia, undifferentiated (HCC) [F20.3] 12/30/2017  . Noncompliance [Z91.19] 12/30/2017  . Aggressive behavior of adolescent [R46.89] 10/24/2015   Total Time spent with patient: 30 minutes  Past Psychiatric History: History of more than one recent hospitalizations from new onset psychotic disorder  Past Medical History:  Past Medical History:  Diagnosis Date  . Asthma   . Depression   . Psychosis [facility])     Past Surgical History:  Procedure Laterality Date  . BACK SURGERY     Family History: History reviewed. No pertinent family history. Family Psychiatric  History: None known Social History:  Social History   Substance and Sexual Activity  Alcohol Use No     Social History   Substance and Sexual Activity  Drug Use Yes  . Types: Marijuana    Social History   Socioeconomic History  . Marital status: Single    Spouse name: Not on file  . Number of children: Not on file  . Years of education: Not on file  . Highest education level: Not on file  Occupational History  . Not on file  Social Needs  . Financial resource strain: Not on file  . Food insecurity:    Worry: Not on file    Inability: Not on file  . Transportation needs:    Medical: Not on file    Non-medical:  Not on file  Tobacco Use  . Smoking status: Never Smoker  . Smokeless tobacco: Never Used  Substance and Sexual Activity  . Alcohol use: No  . Drug use: Yes    Types: Marijuana  . Sexual activity: Not Currently  Lifestyle  . Physical activity:    Days per week: Not on file    Minutes per session: Not on file  . Stress: Not on file  Relationships  . Social connections:    Talks on phone: Not on file    Gets together: Not on file    Attends religious service: Not on file    Active member of club or organization: Not on file    Attends meetings of clubs or organizations: Not on file    Relationship status: Not on file  Other Topics Concern  . Not on file  Social History Narrative  . Not on file   Additional Social History:                         Sleep: Fair  Appetite:  Fair  Current Medications: Current Facility-Administered Medications  Medication Dose Route Frequency Provider Last Rate Last Dose  . acetaminophen (TYLENOL) tablet 650 mg  650 mg Oral Q6H PRN Avon Molock, [name], MD      . alum & mag hydroxide-simeth (MAALOX/MYLANTA) 200-200-20 MG/5ML suspension  30 mL  30 mL Oral Q4H PRN Jameila Keeny T, MD      . cloZAPine (CLOZARIL) tablet 250 mg  250 mg Oral QHS [name], [name], MD   250 mg at 01/09/18 2139  . hydrOXYzine (ATARAX/VISTARIL) tablet 50 mg  50 mg Oral TID PRN Abraham Entwistle, [name], MD   50 mg at 01/08/18 1701  . magnesium hydroxide (MILK OF MAGNESIA) suspension 30 mL  30 mL Oral Daily PRN Renise Gillies T, MD      . metoprolol tartrate (LOPRESSOR) tablet 12.5 mg  12.5 mg Oral BID [name], [name], MD   12.5 mg at 01/09/18 1703  . OLANZapine zydis (ZYPREXA) disintegrating tablet 20 mg  20 mg Oral QHS [name], [name], MD   20 mg at 01/09/18 2138   Or  . OLANZapine (ZYPREXA) injection 10 mg  10 mg Intramuscular QHS [name], [name], MD      . OLANZapine (ZYPREXA) tablet 10 mg  10 mg Oral Q6H PRN [name], [name], MD   10 mg at 01/08/18  1701   Or  . OLANZapine (ZYPREXA) injection 10 mg  10 mg Intramuscular Q6H PRN [name], [name], MD      . traZODone (DESYREL) tablet 100 mg  100 mg Oral QHS PRN Renly Roots, [name], MD   100 mg at 01/08/18 2146    Lab Results: No results found for this or any previous visit (from the past 48 hour(s)).  Blood Alcohol level:  Lab Results  Component Value Date   ETH <10 12/30/2017   ETH <5 09/29/2016    Metabolic Disorder Labs: Lab Results  Component Value Date   HGBA1C 4.6 (L) 12/30/2017   MPG 85.32 12/30/2017   No results found for: PROLACTIN Lab Results  Component Value Date   CHOL 185 12/30/2017   TRIG 125 12/30/2017   HDL 63 12/30/2017   CHOLHDL 2.9 12/30/2017   VLDL 25 12/30/2017   LDLCALC 97 12/30/2017    Physical Findings: AIMS:  , ,  ,  ,    CIWA:    COWS:     Musculoskeletal: Strength & Muscle Tone: within normal limits Gait & Station: normal Patient leans: N/A  Psychiatric Specialty Exam: Physical Exam  Nursing note and vitals reviewed. Constitutional: He appears well-developed and well-nourished.  HENT:  Head: Normocephalic and atraumatic.  Eyes: Pupils are equal, round, and reactive to light. Conjunctivae are normal.  Neck: Normal range of motion.  Cardiovascular: Regular rhythm and normal heart sounds.  Respiratory: Effort normal. No respiratory distress.  GI: Soft.  Musculoskeletal: Normal range of motion.  Neurological: He is alert.  Skin: Skin is warm and dry.  Psychiatric: Judgment normal. His affect is blunt. His speech is delayed. He is slowed. Thought content is not paranoid. Cognition and memory are impaired. He expresses no homicidal and no suicidal ideation.    Review of Systems  Constitutional: Negative.   HENT: Negative.   Eyes: Negative.   Respiratory: Negative.   Cardiovascular: Negative.   Gastrointestinal: Negative.   Musculoskeletal: Negative.   Skin: Negative.   Neurological: Negative.   Psychiatric/Behavioral:  Negative.     Blood pressure 124/86, pulse 100, temperature 97.8 F (36.6 C), temperature source Oral, resp. rate 18, height 6\' 2"  (1.88 m), weight 83.5 kg (184 lb), SpO2 99 %.Body mass index is 23.62 kg/m.  General Appearance: Fairly Groomed  Eye Contact:  Fair  Speech:  Slow  Volume:  Decreased  Mood:  Euthymic  Affect:  Constricted  Thought Process:  Goal Directed  Orientation:  Full (Time, Place, and Person)  Thought Content:  Logical  Suicidal Thoughts:  No  Homicidal Thoughts:  No  Memory:  Immediate;   Fair Recent;   Fair Remote;   Fair  Judgement:  Fair  Insight:  Fair  Psychomotor Activity:  Decreased  Concentration:  Concentration: Fair  Recall:  [employer]  Fund of Knowledge:  Fair  Language:  Fair  Akathisia:  No  Handed:  Right  AIMS (if indicated):     Assets:  Desire for Improvement Housing Physical Health Social Support  ADL's:  Intact  Cognition:  WNL  Sleep:  Number of Hours: 7     Treatment Plan Summary: Daily contact with patient to assess and evaluate symptoms and progress in treatment, Medication management and Plan No indication to change antipsychotic or other medicine.  Patient seems to be gradually improving.  Spent some time counseling him about the recovery from schizophrenia and encouraging him to consider [facility].  Patient will continue to attend groups and have ongoing assessment is discharge planning continues  Mordecai RasmussenJohn Anilah Huck, MD 01/10/2018, 1:48 PM

## 2018-01-11 NOTE — Plan of Care (Signed)
Pt cooperative this evening. Pt compliant with medications. Pt denies SI/HI. Pt continues to pace around the unit. Pt is receptive to treatment and safety maintained on unit. Will continue to monitor. Problem: Education: Goal: Knowledge of [place] [facility] Education information/materials will improve Outcome: Progressing Goal: Mental status will improve Outcome: Progressing   Problem: Coping: Goal: Ability to verbalize frustrations and anger appropriately will improve Outcome: Progressing Goal: Ability to demonstrate self-control will improve Outcome: Progressing   Problem: Health Behavior/Discharge Planning: Goal: Compliance with treatment plan for underlying cause of condition will improve Outcome: Progressing   Problem: Activity: Goal: Will verbalize the importance of balancing activity with adequate rest periods Outcome: Progressing   Problem: Coping: Goal: Will verbalize feelings Outcome: Progressing

## 2018-01-11 NOTE — [facility] Group Notes (Signed)
LCSW Group Therapy Note 01/11/2018 1:15pm  Type of Therapy and Topic: Group Therapy: Feelings Around Returning Home & Establishing a Supportive Framework and Supporting Oneself When Supports Not Available  Participation Level: Did Not Attend  Description of Group:  Patients first processed thoughts and feelings about upcoming discharge. These included fears of upcoming changes, lack of change, new living environments, judgements and expectations from others and overall stigma of mental health issues. The group then discussed the definition of a supportive framework, what that looks and feels like, and how do to discern it from an unhealthy non-supportive network. The group identified different types of supports as well as what to do when your family/friends are less than helpful or unavailable  Therapeutic Goals  1. Patient will identify one healthy supportive network that they can use at discharge. 2. Patient will identify one factor of a supportive framework and how to tell it from an unhealthy network. 3. Patient able to identify one coping skill to use when they do not have positive supports from others. 4. Patient will demonstrate ability to communicate their needs through discussion and/or role plays.  Summary of Patient Progress:  Pt was invited to attend group but chose not to attend. CSW will continue to encourage pt to attend group throughout their admission.   Therapeutic Modalities Cognitive Behavioral Therapy Motivational Interviewing   [name] [name]  CUEBAS-COLON, LCSW 01/11/2018 11:11 AM

## 2018-01-11 NOTE — Plan of Care (Signed)
Patient verbalizes understanding of the general information that's been provided to him and he has not voiced any further questions/concerns at this time. Patient denies SI/HI/AVH as well as any signs/symptoms of depression/anxiety at this time stating to this writer that he is feeling "a bit better". Patient has the ability to verbalize frustrations in an appropriate manner. Patient continues to pace the unit throughout the day, however, patient has demonstrated self-control. Patient has been in compliance with with his treatment plan and has not experienced any health-related complications thus far. Patient remains safe on the unit at this time.   Problem: Education: Goal: Knowledge of [place] [facility] Education information/materials will improve Outcome: Progressing Goal: Emotional status will improve Outcome: Progressing Goal: Mental status will improve Outcome: Progressing Goal: Verbalization of understanding the information provided will improve Outcome: Progressing   Problem: Coping: Goal: Ability to verbalize frustrations and anger appropriately will improve Outcome: Progressing Goal: Ability to demonstrate self-control will improve Outcome: Progressing   Problem: Health Behavior/Discharge Planning: Goal: Identification of resources available to assist in meeting health care needs will improve Outcome: Progressing Goal: Compliance with treatment plan for underlying cause of condition will improve Outcome: Progressing   Problem: Elimination: Goal: Will not experience complications related to bowel motility Outcome: Progressing   Problem: Activity: Goal: Will verbalize the importance of balancing activity with adequate rest periods Outcome: Progressing   Problem: Coping: Goal: Coping ability will improve Outcome: Progressing Goal: Will verbalize feelings Outcome: Progressing

## 2018-01-11 NOTE — Progress Notes (Signed)
[name] MD Progress Note  01/11/2018 11:00 AM [name] [name]  MRN:  [id number] Subjective: Follow-up note for 22 year old man with schizophrenia.  Patient had no specific complaints today.  He did have some appropriate questions about discharge planning including whether the program and [facility] would be able to pick him up.  Patient denies having any hallucinations.  Still a little blunted and slow but able to engage in appropriate conversation.  Taking care of his hygiene without any difficulty.  No physical complaints. Principal Problem: Schizophrenia, undifferentiated (HCC) Diagnosis:   Patient Active Problem List   Diagnosis Date Noted  . Schizophrenia, undifferentiated (HCC) [F20.3] 12/30/2017  . Noncompliance [Z91.19] 12/30/2017  . Aggressive behavior of adolescent [R46.89] 10/24/2015   Total Time spent with patient: 20 minutes  Past Psychiatric History: History of schizophrenia with a couple of hospitalizations only now starting to possibly get things under control  Past Medical History:  Past Medical History:  Diagnosis Date  . Asthma   . Depression   . Psychosis [facility])     Past Surgical History:  Procedure Laterality Date  . BACK SURGERY     Family History: History reviewed. No pertinent family history. Family Psychiatric  History: See previous note Social History:  Social History   Substance and Sexual Activity  Alcohol Use No     Social History   Substance and Sexual Activity  Drug Use Yes  . Types: Marijuana    Social History   Socioeconomic History  . Marital status: Single    Spouse name: Not on file  . Number of children: Not on file  . Years of education: Not on file  . Highest education level: Not on file  Occupational History  . Not on file  Social Needs  . Financial resource strain: Not on file  . Food insecurity:    Worry: Not on file    Inability: Not on file  . Transportation needs:    Medical: Not on file    Non-medical: Not on file   Tobacco Use  . Smoking status: Never Smoker  . Smokeless tobacco: Never Used  Substance and Sexual Activity  . Alcohol use: No  . Drug use: Yes    Types: Marijuana  . Sexual activity: Not Currently  Lifestyle  . Physical activity:    Days per week: Not on file    Minutes per session: Not on file  . Stress: Not on file  Relationships  . Social connections:    Talks on phone: Not on file    Gets together: Not on file    Attends religious service: Not on file    Active member of club or organization: Not on file    Attends meetings of clubs or organizations: Not on file    Relationship status: Not on file  Other Topics Concern  . Not on file  Social History Narrative  . Not on file   Additional Social History:                         Sleep: Fair  Appetite:  Fair  Current Medications: Current Facility-Administered Medications  Medication Dose Route Frequency Provider Last Rate Last Dose  . acetaminophen (TYLENOL) tablet 650 mg  650 mg Oral Q6H PRN [name], [name], MD      . alum & mag hydroxide-simeth (MAALOX/MYLANTA) 200-200-20 MG/5ML suspension 30 mL  30 mL Oral Q4H PRN [name], [name], MD      .  cloZAPine (CLOZARIL) tablet 250 mg  250 mg Oral QHS [name], [name], MD   250 mg at 01/10/18 2138  . hydrOXYzine (ATARAX/VISTARIL) tablet 50 mg  50 mg Oral TID PRN [name], [name], MD   50 mg at 01/10/18 2305  . magnesium hydroxide (MILK OF MAGNESIA) suspension 30 mL  30 mL Oral Daily PRN [name], [name], MD      . metoprolol tartrate (LOPRESSOR) tablet 12.5 mg  12.5 mg Oral BID [name], [name], MD   12.5 mg at 01/11/18 0841  . OLANZapine zydis (ZYPREXA) disintegrating tablet 20 mg  20 mg Oral QHS [name], [name], MD   20 mg at 01/10/18 2138   Or  . OLANZapine (ZYPREXA) injection 10 mg  10 mg Intramuscular QHS [name], [name], MD      . OLANZapine (ZYPREXA) tablet 10 mg  10 mg Oral Q6H PRN [name], [name], MD   10 mg at 01/10/18 2305   Or   . OLANZapine (ZYPREXA) injection 10 mg  10 mg Intramuscular Q6H PRN [name], [name], MD      . traZODone (DESYREL) tablet 100 mg  100 mg Oral QHS PRN [name], [name], MD   100 mg at 01/10/18 2138    Lab Results: No results found for this or any previous visit (from the past 48 hour(s)).  Blood Alcohol level:  Lab Results  Component Value Date   ETH <10 12/30/2017   ETH <5 09/29/2016    Metabolic Disorder Labs: Lab Results  Component Value Date   HGBA1C 4.6 (L) 12/30/2017   MPG 85.32 12/30/2017   No results found for: PROLACTIN Lab Results  Component Value Date   CHOL 185 12/30/2017   TRIG 125 12/30/2017   HDL 63 12/30/2017   CHOLHDL 2.9 12/30/2017   VLDL 25 12/30/2017   LDLCALC 97 12/30/2017    Physical Findings: AIMS:  , ,  ,  ,    CIWA:    COWS:     Musculoskeletal: Strength & Muscle Tone: within normal limits Gait & Station: normal Patient leans: N/A  Psychiatric Specialty Exam: Physical Exam  Nursing note and vitals reviewed. Constitutional: He appears well-developed and well-nourished.  HENT:  Head: Normocephalic and atraumatic.  Eyes: Pupils are equal, round, and reactive to light. Conjunctivae are normal.  Neck: Normal range of motion.  Cardiovascular: Regular rhythm and normal heart sounds.  Respiratory: Effort normal. No respiratory distress.  GI: Soft.  Musculoskeletal: Normal range of motion.  Neurological: He is alert.  Skin: Skin is warm and dry.  Psychiatric: He has a normal mood and affect. His behavior is normal. Judgment and thought content normal.    Review of Systems  Constitutional: Negative.   HENT: Negative.   Eyes: Negative.   Respiratory: Negative.   Cardiovascular: Negative.   Gastrointestinal: Negative.   Musculoskeletal: Negative.   Skin: Negative.   Neurological: Negative.   Psychiatric/Behavioral: Negative.     Blood pressure (!) 132/94, pulse 77, temperature 97.6 F (36.4 C), temperature source Oral, resp. rate  18, height 6\' 2"  (1.88 m), weight 83.5 kg (184 lb), SpO2 100 %.Body mass index is 23.62 kg/m.  General Appearance: Casual  Eye Contact:  Good  Speech:  Clear and Coherent  Volume:  Normal  Mood:  Euthymic  Affect:  Constricted  Thought Process:  Coherent  Orientation:  Full (Time, Place, and Person)  Thought Content:  Logical  Suicidal Thoughts:  No  Homicidal Thoughts:  No  Memory:  Immediate;   Fair Recent;   Fair Remote;   Fair  Judgement:  Fair  Insight:  Fair  Psychomotor Activity:  Decreased  Concentration:  Concentration: Fair  Recall:  [employer]  Fund of Knowledge:  Fair  Language:  Fair  Akathisia:  No  Handed:  Right  AIMS (if indicated):     Assets:  Desire for Improvement Physical Health Resilience  ADL's:  Intact  Cognition:  WNL  Sleep:  Number of Hours: 6.5     Treatment Plan Summary: Daily contact with patient to assess and evaluate symptoms and progress in treatment, Medication management and Plan Patient is seems to be improving.  Much calmer much more lucid better insight.  Encourage patient to continue working with his treatment team on outpatient plans.  I am glad to hear that he is thinking seriously about going to the program he has discussed with Dr. Demetrius CharityP.  No change to medicine or treatment plan today.  Mordecai RasmussenJohn [name], MD 01/11/2018, 11:00 AM

## 2018-01-11 NOTE — Progress Notes (Signed)
D- Patient alert and oriented. Patient presents in a pleasant mood on assessment stating to this writer that he slept "well" last  Night and has no major complaints at this time. Patient states to this writer that overall he is feeling "a bit better". Patient denies SI, HI, AVH, and pain at this time. Patient also denies any signs/symptoms of depression and anxiety at this time. Patient's goal for today is to "think positively".   A- Scheduled medications administered to patient, per MD orders. Support and encouragement provided.  Routine safety checks conducted every 15 minutes.  Patient informed to notify staff with problems or concerns.  R- No adverse drug reactions noted. Patient contracts for safety at this time. Patient compliant with medications and treatment plan. Patient receptive, calm, and cooperative. Patient interacts well with others on the unit.  Patient remains safe at this time.

## 2018-01-12 MED ORDER — CLOZAPINE 100 MG PO TABS
300.0000 mg | ORAL_TABLET | Freq: Every day | ORAL | Status: DC
  Administered 2018-01-12 – 2018-01-29 (×18): 300 mg via ORAL
  Filled 2018-01-12 (×19): qty 3

## 2018-01-12 NOTE — Progress Notes (Addendum)
Received [name] [name] this AM in the bed, he refused to get up for his medication. Later he was OOB in the milieu, pacing at intervals and asking for a sleeping pill at 12 noon then a nicotine patch. He is agreeable when his requests cannot be met. He remained OOB in the milieu at intervals pacing the hallway.

## 2018-01-12 NOTE — Progress Notes (Addendum)
[facility] MD Progress Note  01/12/2018 12:53 PM [name] [name]  MRN:  [id number]  Subjective:   [name] [name] is still very disorganized in his thinking, paranoid-not wanting to deal with his mother, and delusional-believing that he could stay with [name] [name] in [place]. This is a father ofa fellow patient from previous hospitalization. He called [employer], homeless shelter in [place]. He would rasther go there than home with his mom. He denies any symptoms of depression, anxiety or psychosis. Apparently, he takes medications. Reports no side effects.    Principal Problem: Schizophrenia, undifferentiated (HCC) Diagnosis:   Patient Active Problem List   Diagnosis Date Noted  . Schizophrenia, undifferentiated (HCC) [F20.3] 12/30/2017    Priority: High  . Noncompliance [Z91.19] 12/30/2017  . Aggressive behavior of adolescent [R46.89] 10/24/2015   Total Time spent with patient: 20 minutes  Past Psychiatric History: schizophrenia  Past Medical History:  Past Medical History:  Diagnosis Date  . Asthma   . Depression   . Psychosis [facility])     Past Surgical History:  Procedure Laterality Date  . BACK SURGERY     Family History: History reviewed. No pertinent family history. Family Psychiatric  History: none Social History:  Social History   Substance and Sexual Activity  Alcohol Use No     Social History   Substance and Sexual Activity  Drug Use Yes  . Types: Marijuana    Social History   Socioeconomic History  . Marital status: Single    Spouse name: Not on file  . Number of children: Not on file  . Years of education: Not on file  . Highest education level: Not on file  Occupational History  . Not on file  Social Needs  . Financial resource strain: Not on file  . Food insecurity:    Worry: Not on file    Inability: Not on file  . Transportation needs:    Medical: Not on file    Non-medical: Not on file  Tobacco Use  . Smoking status: Never Smoker  .  Smokeless tobacco: Never Used  Substance and Sexual Activity  . Alcohol use: No  . Drug use: Yes    Types: Marijuana  . Sexual activity: Not Currently  Lifestyle  . Physical activity:    Days per week: Not on file    Minutes per session: Not on file  . Stress: Not on file  Relationships  . Social connections:    Talks on phone: Not on file    Gets together: Not on file    Attends religious service: Not on file    Active member of club or organization: Not on file    Attends meetings of clubs or organizations: Not on file    Relationship status: Not on file  Other Topics Concern  . Not on file  Social History Narrative  . Not on file   Additional Social History:                         Sleep: Fair  Appetite:  Fair  Current Medications: Current Facility-Administered Medications  Medication Dose Route Frequency Provider Last Rate Last Dose  . acetaminophen (TYLENOL) tablet 650 mg  650 mg Oral Q6H PRN [name], [name], MD      . alum & mag hydroxide-simeth (MAALOX/MYLANTA) 200-200-20 MG/5ML suspension 30 mL  30 mL Oral Q4H PRN [name], [name], MD      . cloZAPine (CLOZARIL) tablet 300  mg  300 mg Oral QHS Kahlie Deutscher B, MD      . hydrOXYzine (ATARAX/VISTARIL) tablet 50 mg  50 mg Oral TID PRN [name], [name], MD   50 mg at 01/11/18 1721  . magnesium hydroxide (MILK OF MAGNESIA) suspension 30 mL  30 mL Oral Daily PRN [name], [name], MD      . metoprolol tartrate (LOPRESSOR) tablet 12.5 mg  12.5 mg Oral BID Margy Sumler B, MD   12.5 mg at 01/12/18 1040  . OLANZapine zydis (ZYPREXA) disintegrating tablet 20 mg  20 mg Oral QHS Jacquetta Polhamus B, MD   20 mg at 01/11/18 2135  . traZODone (DESYREL) tablet 100 mg  100 mg Oral QHS PRN [name], [name], MD   100 mg at 01/11/18 2135    Lab Results: No results found for this or any previous visit (from the past 48 hour(s)).  Blood Alcohol level:  Lab Results  Component Value Date   ETH <10 12/30/2017   ETH  <5 09/29/2016    Metabolic Disorder Labs: Lab Results  Component Value Date   HGBA1C 4.6 (L) 12/30/2017   MPG 85.32 12/30/2017   No results found for: PROLACTIN Lab Results  Component Value Date   CHOL 185 12/30/2017   TRIG 125 12/30/2017   HDL 63 12/30/2017   CHOLHDL 2.9 12/30/2017   VLDL 25 12/30/2017   LDLCALC 97 12/30/2017    Physical Findings: AIMS:  , ,  ,  ,    CIWA:    COWS:     Musculoskeletal: Strength & Muscle Tone: within normal limits Gait & Station: normal Patient leans: N/A  Psychiatric Specialty Exam: Physical Exam  Nursing note and vitals reviewed. Psychiatric: His speech is normal. His affect is blunt. He is slowed and withdrawn. Thought content is paranoid and delusional. Cognition and memory are impaired. He expresses impulsivity.    Review of Systems  Neurological: Negative.   Psychiatric/Behavioral: Positive for substance abuse.  All other systems reviewed and are negative.   Blood pressure (!) 147/90, pulse 64, temperature 97.7 F (36.5 C), temperature source Oral, resp. rate 18, height 6\' 2"  (1.88 m), weight 83.5 kg (184 lb), SpO2 100 %.Body mass index is 23.62 kg/m.  General Appearance: Casual  Eye Contact:  Good  Speech:  Clear and Coherent  Volume:  Normal  Mood:  Dysphoric and Irritable  Affect:  Flat  Thought Process:  Disorganized and Descriptions of Associations: Tangential  Orientation:  Full (Time, Place, and Person)  Thought Content:  Illogical, Delusions and Paranoid Ideation  Suicidal Thoughts:  No  Homicidal Thoughts:  No  Memory:  Immediate;   Fair Recent;   Fair Remote;   Fair  Judgement:  Poor  Insight:  Lacking  Psychomotor Activity:  Psychomotor Retardation  Concentration:  Concentration: Fair and Attention Span: Fair  Recall:  [employer]Fair  Fund of Knowledge:  Fair  Language:  Fair  Akathisia:  No  Handed:  Right  AIMS (if indicated):     Assets:  Communication Skills Desire for Improvement Financial  Resources/Insurance Physical Health Resilience Social Support  ADL's:  Intact  Cognition:  WNL  Sleep:  Number of Hours: 7.3     Treatment Plan Summary: Daily contact with patient to assess and evaluate symptoms and progress in treatment and Medication management   [name] [name] is a 22 year old male with a history of schizophrenia admitted for psychotic break in the context of treatment noncomplinace. He is still on Zyprexa and Clozapine.    #  Psychosis, still floridly psychotic -continue Zyprexa zydis 20 mg nightly -continue Clozapine 300 mg nightly   #Tachycardia and HTN, improved -continue start Metoprolol12.5 mg BID  #Insomnia, improved with treatment -Trazodone 100 mg nightlyPRN only  #Labs -lipid panel, TSH and A1Care unremerkable -EKGreviewed, NSR with QTc 374  #Disposition -TBE -discharge to home with family -follow up with [facility] ACT team -unable to absorb information on [facility] program    Kristine Linea, MD 01/12/2018, 12:53 PM

## 2018-01-12 NOTE — Progress Notes (Signed)
Recreation Therapy Notes  Date: 01/12/2018  Time: 9:30 am   Location: Craft room   Behavioral response: N/A   Intervention Topic: Stress  Discussion/Intervention: Patient did not attend group.   Clinical Observations/Feedback:  Patient did not attend group.   [name] LRT/CTRS        [name] [name] 01/12/2018 11:02 AM 

## 2018-01-13 NOTE — BHH Group Notes (Signed)
CSW Group Therapy Note  01/13/2018  Time:  0900  Type of Therapy and Topic: Group Therapy: Goals Group: SMART Goals    Participation Level:  Did Not Attend    Description of Group:   The purpose of a daily goals group is to assist and guide patients in setting recovery/wellness-related goals. The objective is to set goals as they relate to the crisis in which they were admitted. Patients will be using SMART goal modalities to set measurable goals. Characteristics of realistic goals will be discussed and patients will be assisted in setting and processing how one will reach their goal. Facilitator will also assist patients in applying interventions and coping skills learned in psycho-education groups to the SMART goal and process how one will achieve defined goal.    Therapeutic Goals:  -Patients will develop and document one goal related to or their crisis in which brought them into treatment.  -Patients will be guided by LCSW using SMART goal setting modality in how to set a measurable, attainable, realistic and time sensitive goal.  -Patients will process barriers in reaching goal.  -Patients will process interventions in how to overcome and successful in reaching goal.    Patient's Goal:   Pt was invited to attend group but chose not to attend. CSW will continue to encourage pt to attend group throughout their admission.   Therapeutic Modalities:  Motivational Interviewing  Cognitive Behavioral Therapy  Crisis Intervention Model  SMART goals setting  Meriah Shands, MSW, LCSW Clinical Social Worker 01/13/2018 9:35 AM   

## 2018-01-13 NOTE — Progress Notes (Signed)
Recreation Therapy Notes  Date: 01/13/2018  Time: 9:30 am   Location: Craft room   Behavioral response: N/A   Intervention Topic: Happiness  Discussion/Intervention: Patient did not attend group.   Clinical Observations/Feedback:  Patient did not attend group.   Jaquia Benedicto LRT/CTRS        Catrice Zuleta 01/13/2018 10:27 AM 

## 2018-01-13 NOTE — B[facility] Counselor (Signed)
[facility] to request information about [facility] program and admission process on the patients behalf. CSW left a voicemail with Jenell MillinerValerie Sulvante the admissions coordinator with a call back number. CSW will continue to follow up.  Johny Shearsassandra Malissie Musgrave, MSW, Theresia MajorsLCSWA, Bridget HartshornLCASA Clinical Social Worker 01/13/2018 11:58 AM

## 2018-01-13 NOTE — Plan of Care (Signed)
Patient verbalizes understanding of the general information that's been provided to him and all questions/concerns have been addressed and answered at this time. Patient denies SI/HI/AVH as well as any signs/symptoms of depression/anxiety at this time stating to this writer that he is "feeling good". Patient has the ability to verbalize frustrations in an appropriate manner. Patient continues to pace the unit, however, he demonstrates self-control on the unit. Patient has been in compliance with his treatment plan and has not experienced any health-related complications thus far. Patient remains safe on the unit.   Problem: Education: Goal: Knowledge of [facility] Education information/materials will improve Outcome: Progressing Goal: Emotional status will improve Outcome: Progressing Goal: Mental status will improve Outcome: Progressing Goal: Verbalization of understanding the information provided will improve Outcome: Progressing   Problem: Coping: Goal: Ability to verbalize frustrations and anger appropriately will improve Outcome: Progressing Goal: Ability to demonstrate self-control will improve Outcome: Progressing   Problem: Health Behavior/Discharge Planning: Goal: Identification of resources available to assist in meeting health care needs will improve Outcome: Progressing Goal: Compliance with treatment plan for underlying cause of condition will improve Outcome: Progressing   Problem: Elimination: Goal: Will not experience complications related to bowel motility Outcome: Progressing   Problem: Activity: Goal: Will verbalize the importance of balancing activity with adequate rest periods Outcome: Progressing   Problem: Coping: Goal: Coping ability will improve Outcome: Progressing Goal: Will verbalize feelings Outcome: Progressing

## 2018-01-13 NOTE — Plan of Care (Signed)
  Problem: Activity: Goal: Will verbalize the importance of balancing activity with adequate rest periods Outcome: Progressing  Patient expressed feeling better, balancing activity rest and activity .

## 2018-01-13 NOTE — B[facility] Group Notes (Signed)
B[facility] Group Notes:  (Nursing/MHT/Case Management/Adjunct)  Date:  01/13/2018  Time:  9:09 PM  Type of Therapy:  Group Therapy  Participation Level:  Did Not Attend  Kai LevinsKori  Timoth Schara 01/13/2018, 9:09 PM

## 2018-01-13 NOTE — Progress Notes (Signed)
[facility]  01/13/2018 4:27 PM [name] [name]  MRN:  [id number] Subjective: Follow-up for 22 year old man with schizophrenia.  Patient is requesting discharge.  On interview however his insight is still poor and his thoughts very flat and withdrawn.  Patient told me he did not believe he had schizophrenia and wanted a "second opinion".  I pointed out to him that I was not the first person to come to that conclusion in fact every psychiatrist who has been working with him can see that he has a chronic psychotic disorder and that the medications are helpful for him.  Patient's cognitions are slow.  Asked to talk about his plans for the future he is semi-realistic at best. Principal Problem: Schizophrenia, undifferentiated (HCC) Diagnosis:   Patient Active Problem List   Diagnosis Date Noted  . Schizophrenia, undifferentiated (HCC) [F20.3] 12/30/2017  . Noncompliance [Z91.19] 12/30/2017  . Aggressive behavior of adolescent [R46.89] 10/24/2015   Total Time spent with patient: 30 minutes  Past Psychiatric History: History of several hospitalizations with psychosis  Past Medical History:  Past Medical History:  Diagnosis Date  . Asthma   . Depression   . Psychosis [facility](HCC)     Past Surgical History:  Procedure Laterality Date  . BACK SURGERY     Family History: History reviewed. No pertinent family history. Family Psychiatric  History: None identified.  See previous note Social History:  Social History   Substance and Sexual Activity  Alcohol Use No     Social History   Substance and Sexual Activity  Drug Use Yes  . Types: Marijuana    Social History   Socioeconomic History  . Marital status: Single    Spouse name: Not on file  . Number of children: Not on file  . Years of education: Not on file  . Highest education level: Not on file  Occupational History  . Not on file  Social Needs  . Financial resource strain: Not on file  . Food insecurity:    Worry: Not  on file    Inability: Not on file  . Transportation needs:    Medical: Not on file    Non-medical: Not on file  Tobacco Use  . Smoking status: Never Smoker  . Smokeless tobacco: Never Used  Substance and Sexual Activity  . Alcohol use: No  . Drug use: Yes    Types: Marijuana  . Sexual activity: Not Currently  Lifestyle  . Physical activity:    Days per week: Not on file    Minutes per session: Not on file  . Stress: Not on file  Relationships  . Social connections:    Talks on phone: Not on file    Gets together: Not on file    Attends religious service: Not on file    Active member of club or organization: Not on file    Attends meetings of clubs or organizations: Not on file    Relationship status: Not on file  Other Topics Concern  . Not on file  Social History Narrative  . Not on file   Additional Social History:                         Sleep: Fair  Appetite:  Fair  Current Medications: Current Facility-Administered Medications  Medication Dose Route Frequency Provider Last Rate Last Dose  . acetaminophen (TYLENOL) tablet 650 mg  650 mg Oral Q6H PRN Bryar Dahms, [name], MD      .  alum & mag hydroxide-simeth (MAALOX/MYLANTA) 200-200-20 MG/5ML suspension 30 mL  30 mL Oral Q4H PRN Jaxxson Cavanah T, MD      . cloZAPine (CLOZARIL) tablet 300 mg  300 mg Oral QHS [name], [name], MD   300 mg at 01/12/18 2219  . hydrOXYzine (ATARAX/VISTARIL) tablet 50 mg  50 mg Oral TID PRN Gil Ingwersen, [name], MD   50 mg at 01/11/18 1721  . magnesium hydroxide (MILK OF MAGNESIA) suspension 30 mL  30 mL Oral Daily PRN Reyonna Haack T, MD      . metoprolol tartrate (LOPRESSOR) tablet 12.5 mg  12.5 mg Oral BID [name], [name], MD   12.5 mg at 01/13/18 0845  . OLANZapine zydis (ZYPREXA) disintegrating tablet 20 mg  20 mg Oral QHS [name], [name], MD   20 mg at 01/12/18 2220  . traZODone (DESYREL) tablet 100 mg  100 mg Oral QHS PRN Rodrickus Min, [name], MD   100 mg at 01/12/18  2220    Lab Results: No results found for this or any previous visit (from the past 48 hour(s)).  Blood Alcohol level:  Lab Results  Component Value Date   ETH <10 12/30/2017   ETH <5 09/29/2016    Metabolic Disorder Labs: Lab Results  Component Value Date   HGBA1C 4.6 (L) 12/30/2017   MPG 85.32 12/30/2017   No results found for: PROLACTIN Lab Results  Component Value Date   CHOL 185 12/30/2017   TRIG 125 12/30/2017   HDL 63 12/30/2017   CHOLHDL 2.9 12/30/2017   VLDL 25 12/30/2017   LDLCALC 97 12/30/2017    Physical Findings: AIMS:  , ,  ,  ,    CIWA:    COWS:     Musculoskeletal: Strength & Muscle Tone: within normal limits Gait & Station: normal Patient leans: N/A  Psychiatric Specialty Exam: Physical Exam  Nursing note and vitals reviewed. Constitutional: He appears well-developed and well-nourished.  HENT:  Head: Normocephalic and atraumatic.  Eyes: Pupils are equal, round, and reactive to light. Conjunctivae are normal.  Neck: Normal range of motion.  Cardiovascular: Regular rhythm and normal heart sounds.  Respiratory: Effort normal. No respiratory distress.  GI: Soft.  Musculoskeletal: Normal range of motion.  Neurological: He is alert.  Skin: Skin is warm and dry.  Psychiatric: His affect is blunt. His speech is delayed. He is slowed. Thought content is paranoid. Cognition and memory are impaired. He expresses impulsivity. He expresses no homicidal and no suicidal ideation.    Review of Systems  Constitutional: Negative.   HENT: Negative.   Eyes: Negative.   Respiratory: Negative.   Cardiovascular: Negative.   Gastrointestinal: Negative.   Musculoskeletal: Negative.   Skin: Negative.   Neurological: Negative.   Psychiatric/Behavioral: Positive for depression and hallucinations. Negative for memory loss, substance abuse and suicidal ideas. The patient is not nervous/anxious and does not have insomnia.     Blood pressure 109/90, pulse (!) 107,  temperature 98.4 F (36.9 C), temperature source Oral, resp. rate 18, height 6\' 2"  (1.88 m), weight 83.5 kg (184 lb), SpO2 100 %.Body mass index is 23.62 kg/m.  General Appearance: Fairly Groomed  Eye Contact:  Good  Speech:  Slow  Volume:  Decreased  Mood:  Dysphoric  Affect:  Constricted  Thought Process:  Disorganized  Orientation:  Full (Time, Place, and Person)  Thought Content:  Illogical and Rumination  Suicidal Thoughts:  No  Homicidal Thoughts:  No  Memory:  Immediate;   Fair Recent;  Fair Remote;   Fair  Judgement:  Impaired  Insight:  Shallow  Psychomotor Activity:  Decreased  Concentration:  Concentration: Fair  Recall:  [employer] of Knowledge:  Fair  Language:  Fair  Akathisia:  No  Handed:  Right  AIMS (if indicated):     Assets:  Desire for Improvement Housing Physical Health Resilience Social Support  ADL's:  Intact  Cognition:  Impaired,  Mild  Sleep:  Number of Hours: 6     Treatment Plan Summary: Daily contact with patient to assess and evaluate symptoms and progress in treatment, Medication management and Plan Patient is superficially improved but still seems to be disorganized in his thinking with poor insight.  High risk for continued problem behavior outside the hospital.  We had been trying to work on a most appropriate discharge plan.  For now continue current medications supportive counseling done to the patient case reviewed with social work.  Mordecai Rasmussen, MD 01/13/2018, 4:27 PM

## 2018-01-13 NOTE — BHH Group Notes (Signed)
01/13/2018 1PM  Type of Therapy/Topic:  Group Therapy:  Feelings about Diagnosis  Participation Level:  Active   Description of Group:   This group will allow patients to explore their thoughts and feelings about diagnoses they have received. Patients will be guided to explore their level of understanding and acceptance of these diagnoses. Facilitator will encourage patients to process their thoughts and feelings about the reactions of others to their diagnosis and will guide patients in identifying ways to discuss their diagnosis with significant others in their lives. This group will be process-oriented, with patients participating in exploration of their own experiences, giving and receiving support, and processing challenge from other group members.   Therapeutic Goals: 1. Patient will demonstrate understanding of diagnosis as evidenced by identifying two or more symptoms of the disorder 2. Patient will be able to express two feelings regarding the diagnosis 3. Patient will demonstrate their ability to communicate their needs through discussion and/or role play  Summary of Patient Progress: Actively and appropriately engaged in the group.Patient practiced active listening when interacting with the facilitator and other group members. [name] [name] reports that he is still in the denial and anger stages of grief. He reports "I need to get a second opinion. I need to get a lawyer to talk to that can keep certain people away from me." Patient got distracted and spoke about how there is nothing to do on the unit. Patient needed to be redirected back to the group discussion. Patient is still in the process of obtaining treatment goals.        Therapeutic Modalities:   Cognitive Behavioral Therapy Brief Therapy Feelings Identification    [name] ShearsCassandra  Nimrat Woolworth, LCSW 01/13/2018 2:06 PM

## 2018-01-13 NOTE — Progress Notes (Signed)
Patient is alert and oriented x 4 , denies SI/HI/AVH,affect is blunted, mood is labile he appears less anxious and irritable and he is receptive towards the nursing staff. Patient's thought this evening appears organized, less intrusive, his his interacting appropriately with peers and staff although pacing around the unit the earlier part of the shift. Patient was given support and encouraged to attend evening wrap up group. 15 minutes safety checks maintained, patient did not attend wrap up group.. No distress noted will continue to monitor .

## 2018-01-13 NOTE — Progress Notes (Signed)
D- Patient alert and oriented. Patient presents in a pleasant mood on assessment stating that he slept "well" last night and had no major complaints. Patient states that overall he is "feeling good". Patient denies SI, HI, AVH, and pain at this time. Patient also denies any signs/symptoms of depression and anxiety stating to this writer "I'm completely fine". Patient has no stated goal "no goals in particular, just have a good day".  A- Scheduled medications administered to patient, per MD orders. Support and encouragement provided.  Routine safety checks conducted every 15 minutes.  Patient informed to notify staff with problems or concerns.  R- No adverse drug reactions noted. Patient contracts for safety at this time. Patient compliant with medications and treatment plan. Patient receptive, calm, and cooperative. Patient interacts well with others on the unit.  Patient remains safe at this time.

## 2018-01-13 NOTE — BHH Counselor (Signed)
CSW spoke with Jenell MillinerValerie Sulvante the admissions coordinator with [facility]. She was able to provide information on the referral process and the programs that are offered. She reports that since the patient is under the age of 22 without SSDI benefits he is ineligible for the community housing program. She reports that the patient would need to at least be in the process of obtaining disability to even be considered due to having to pay rent. She mentions that the patient may qualify for their vocational program but will need to have housing in [place]. She reports that once in that program he will work 1-2 months before being considered into their supervised living low program. There the patient will have to pay rent and be working within their vocational program. Both housing programs will require the patient to pay rent through disability or employment. She says that the minium the patient can do is the peer support program who can meet with him. She says that the entire application process is 1-3 months long due to referrals going through [employer]. At this moment this program is not appropriate for the patient because due to his age (under 3923) he needs to obtain disability or be in the process of obtaining disability and have residence in [place], [place].  Johny Shearsassandra Talen Poser, MSW, Theresia MajorsLCSWA, Bridget HartshornLCASA Clinical Social Worker 01/13/2018 2:23 PM

## 2018-01-14 LAB — CBC WITH DIFFERENTIAL/PLATELET
BASOS PCT: 0 %
Basophils Absolute: 0 10*3/uL (ref 0–0.1)
Eosinophils Absolute: 0 10*3/uL (ref 0–0.7)
Eosinophils Relative: 0 %
HEMATOCRIT: 46.9 % (ref 40.0–52.0)
HEMOGLOBIN: 16.3 g/dL (ref 13.0–18.0)
LYMPHS ABS: 1.8 10*3/uL (ref 1.0–3.6)
Lymphocytes Relative: 44 %
MCH: 28.8 pg (ref 26.0–34.0)
MCHC: 34.6 g/dL (ref 32.0–36.0)
MCV: 83.1 fL (ref 80.0–100.0)
MONO ABS: 0.6 10*3/uL (ref 0.2–1.0)
MONOS PCT: 15 %
NEUTROS PCT: 41 %
Neutro Abs: 1.6 10*3/uL (ref 1.4–6.5)
Platelets: 222 10*3/uL (ref 150–440)
RBC: 5.65 MIL/uL (ref 4.40–5.90)
RDW: 13.3 % (ref 11.5–14.5)
WBC: 4 10*3/uL (ref 3.8–10.6)

## 2018-01-14 MED ORDER — OLANZAPINE 5 MG PO TBDP
10.0000 mg | ORAL_TABLET | Freq: Every day | ORAL | Status: DC
  Administered 2018-01-14: 10 mg via ORAL
  Filled 2018-01-14: qty 2

## 2018-01-14 NOTE — Progress Notes (Signed)
D- Patient alert and oriented. Patient presents in a pleasant mood on assessment, not voicing any major complaints, stating to this writer that he slept "well" last night. Patient denies SI, HI, AVH, and pain at this time. Patient also denies any depression and anxiety. Patient's goal for today is to "have a good day".  A- Scheduled medications administered to patient, per MD orders. Support and encouragement provided.  Routine safety checks conducted every 15 minutes.  Patient informed to notify staff with problems or concerns.  R- No adverse drug reactions noted. Patient contracts for safety at this time. Patient compliant with medications and treatment plan. Patient receptive, calm, and cooperative. Patient interacts well with others on the unit.  Patient remains safe at this time.

## 2018-01-14 NOTE — Progress Notes (Signed)
Recreation Therapy Notes  Date: 01/14/2018  Time: 9:30 am   Location: Craft room   Behavioral response: N/A   Intervention Topic: Self-Care  Discussion/Intervention: Patient did not attend group.   Clinical Observations/Feedback:  Patient did not attend group.   Sigismund Cross LRT/CTRS        Cheng Dec 01/14/2018 10:47 AM 

## 2018-01-14 NOTE — Progress Notes (Signed)
[facility] MD Progress Note  01/14/2018 4:26 PM [name] [name]  MRN:  [id number] Subjective: Follow-up for this 22 year old man with schizophrenia.  Met with the patient today.  Chart reviewed.  Patient continues to be neatly dressed and generally appropriate in his interactions.  In interview he is calm and lucid although his insight remains poor.  He was telling me today he plans to avoid his mother when he gets out of the hospital and is still stuck on this idea that he is going to stay with some man in [place] whom he hardly knows.  He denies that he is having hallucinations. Principal Problem: Schizophrenia, undifferentiated ([place]) Diagnosis:   Patient Active Problem List   Diagnosis Date Noted  . Schizophrenia, undifferentiated ([place]) [F20.3] 12/30/2017  . Noncompliance [Z91.19] 12/30/2017  . Aggressive behavior of adolescent [R46.89] 10/24/2015   Total Time spent with patient: 20 minutes  Past Psychiatric History: Past history of schizophrenia with recent hospitalizations  Past Medical History:  Past Medical History:  Diagnosis Date  . Asthma   . Depression   . Psychosis [facility])     Past Surgical History:  Procedure Laterality Date  . BACK SURGERY     Family History: History reviewed. No pertinent family history. Family Psychiatric  History: See previous note Social History:  Social History   Substance and Sexual Activity  Alcohol Use No     Social History   Substance and Sexual Activity  Drug Use Yes  . Types: Marijuana    Social History   Socioeconomic History  . Marital status: Single    Spouse name: Not on file  . Number of children: Not on file  . Years of education: Not on file  . Highest education level: Not on file  Occupational History  . Not on file  Social Needs  . Financial resource strain: Not on file  . Food insecurity:    Worry: Not on file    Inability: Not on file  . Transportation needs:    Medical: Not on file    Non-medical: Not on file   Tobacco Use  . Smoking status: Never Smoker  . Smokeless tobacco: Never Used  Substance and Sexual Activity  . Alcohol use: No  . Drug use: Yes    Types: Marijuana  . Sexual activity: Not Currently  Lifestyle  . Physical activity:    Days per week: Not on file    Minutes per session: Not on file  . Stress: Not on file  Relationships  . Social connections:    Talks on phone: Not on file    Gets together: Not on file    Attends religious service: Not on file    Active member of club or organization: Not on file    Attends meetings of clubs or organizations: Not on file    Relationship status: Not on file  Other Topics Concern  . Not on file  Social History Narrative  . Not on file   Additional Social History:                         Sleep: Fair  Appetite:  Fair  Current Medications: Current Facility-Administered Medications  Medication Dose Route Frequency Provider Last Rate Last Dose  . acetaminophen (TYLENOL) tablet 650 mg  650 mg Oral Q6H PRN ,  T, MD      . alum & mag hydroxide-simeth (MAALOX/MYLANTA) 200-200-20 MG/5ML suspension 30 mL  30 mL Oral Q4H  PRN Ogle Hoeffner, Madie Reno, MD      . cloZAPine (CLOZARIL) tablet 300 mg  300 mg Oral QHS [name], [name], MD   300 mg at 01/13/18 2230  . hydrOXYzine (ATARAX/VISTARIL) tablet 50 mg  50 mg Oral TID PRN Jaiah Weigel, Madie Reno, MD   50 mg at 01/11/18 1721  . magnesium hydroxide (MILK OF MAGNESIA) suspension 30 mL  30 mL Oral Daily PRN Brendt Dible T, MD      . metoprolol tartrate (LOPRESSOR) tablet 12.5 mg  12.5 mg Oral BID [name], [name], MD   12.5 mg at 01/14/18 0835  . OLANZapine zydis (ZYPREXA) disintegrating tablet 10 mg  10 mg Oral QHS Alisabeth Selkirk T, MD      . traZODone (DESYREL) tablet 100 mg  100 mg Oral QHS PRN Sahirah Rudell, Madie Reno, MD   100 mg at 01/12/18 2220    Lab Results:  Results for orders placed or performed during the hospital encounter of 12/30/17 (from the past 48 hour(s))  CBC with  Differential/Platelet     Status: None   Collection Time: 01/14/18  7:20 AM  Result Value Ref Range   WBC 4.0 3.8 - 10.6 K/uL   RBC 5.65 4.40 - 5.90 MIL/uL   Hemoglobin 16.3 13.0 - 18.0 g/dL   HCT 46.9 40.0 - 52.0 %   MCV 83.1 80.0 - 100.0 fL   MCH 28.8 26.0 - 34.0 pg   MCHC 34.6 32.0 - 36.0 g/dL   RDW 13.3 11.5 - 14.5 %   Platelets 222 150 - 440 K/uL   Neutrophils Relative % 41 %   Neutro Abs 1.6 1.4 - 6.5 K/uL   Lymphocytes Relative 44 %   Lymphs Abs 1.8 1.0 - 3.6 K/uL   Monocytes Relative 15 %   Monocytes Absolute 0.6 0.2 - 1.0 K/uL   Eosinophils Relative 0 %   Eosinophils Absolute 0.0 0 - 0.7 K/uL   Basophils Relative 0 %   Basophils Absolute 0.0 0 - 0.1 K/uL    Comment: Performed at [facility], [address] [place], [place] [address]    Blood Alcohol level:  Lab Results  Component Value Date   [facility] <10 12/30/2017   ETH <5 60/45/4098    Metabolic Disorder Labs: Lab Results  Component Value Date   HGBA1C 4.6 (L) 12/30/2017   MPG 85.32 12/30/2017   No results found for: PROLACTIN Lab Results  Component Value Date   CHOL 185 12/30/2017   TRIG 125 12/30/2017   HDL 63 12/30/2017   CHOLHDL 2.9 12/30/2017   VLDL 25 12/30/2017   LDLCALC 97 12/30/2017    Physical Findings: AIMS:  , ,  ,  ,    CIWA:    COWS:     Musculoskeletal: Strength & Muscle Tone: within normal limits Gait & Station: normal Patient leans: N/A  Psychiatric Specialty Exam: Physical Exam  Nursing note and vitals reviewed. Constitutional: He appears well-developed and well-nourished.  HENT:  Head: Normocephalic and atraumatic.  Eyes: Pupils are equal, round, and reactive to light. Conjunctivae are normal.  Neck: Normal range of motion.  Cardiovascular: Regular rhythm and normal heart sounds.  Respiratory: Effort normal. No respiratory distress.  GI: Soft.  Musculoskeletal: Normal range of motion.  Neurological: He is alert.  Skin: Skin is warm and dry.  Psychiatric:  His affect is blunt. His speech is delayed. He is slowed. Thought content is paranoid. He expresses inappropriate judgment. He expresses no homicidal and no suicidal ideation.  He exhibits abnormal recent memory.    Review of Systems  Constitutional: Negative.   HENT: Negative.   Eyes: Negative.   Respiratory: Negative.   Cardiovascular: Negative.   Gastrointestinal: Negative.   Musculoskeletal: Negative.   Skin: Negative.   Neurological: Negative.   Psychiatric/Behavioral: Negative.  Negative for depression.    Blood pressure 125/90, pulse 98, temperature 97.8 F (36.6 C), temperature source Oral, resp. rate 20, height 6' 2" (1.88 m), weight 83.5 kg (184 lb), SpO2 100 %.Body mass index is 23.62 kg/m.  General Appearance: Fairly Groomed  Eye Contact:  Good  Speech:  Slow  Volume:  Decreased  Mood:  Euthymic  Affect:  Constricted  Thought Process:  Goal Directed  Orientation:  Full (Time, Place, and Person)  Thought Content:  Illogical  Suicidal Thoughts:  No  Homicidal Thoughts:  No  Memory:  Immediate;   Fair Recent;   Fair Remote;   Fair  Judgement:  Impaired  Insight:  Shallow  Psychomotor Activity:  Decreased  Concentration:  Concentration: Fair  Recall:  [employer] of Knowledge:  Fair  Language:  Fair  Akathisia:  No  Handed:  Right  AIMS (if indicated):     Assets:  Desire for Improvement Physical Health  ADL's:  Impaired  Cognition:  Impaired,  Mild  Sleep:  Number of Hours: 6     Treatment Plan Summary: Daily contact with patient to assess and evaluate symptoms and progress in treatment, Medication management and Plan Patient continues to show poor insight and has little understanding of his illness.  He is compliant with medicine.  We are going to start tapering off the Zyprexa so that he can be on just the clozapine when he gets out of here.  I talked with the patient about how by far the safest thing he could do is to go stay with his mother again when he  gets out of the hospital and strongly encouraged him to get in touch with her about discharge.  Still not stable enough for discharge from the hospital.  Alethia Berthold, MD 01/14/2018, 4:26 PM

## 2018-01-14 NOTE — Plan of Care (Signed)
  Problem: Education: Goal: Knowledge of [place] [facility] Education information/materials will improve Outcome: Progressing  Patient endorsed understanding of [place] general information.

## 2018-01-14 NOTE — BHH Group Notes (Signed)
LCSW Group Therapy Note  01/14/2018 1:00 pm  Type of Therapy/Topic:  Group Therapy:  Balance in Life  Participation Level:  Minimal  Description of Group:    This group will address the concept of balance and how it feels and looks when one is unbalanced. Patients will be encouraged to process areas in their lives that are out of balance and identify reasons for remaining unbalanced. Facilitators will guide patients in utilizing problem-solving interventions to address and correct the stressor making their life unbalanced. Understanding and applying boundaries will be explored and addressed for obtaining and maintaining a balanced life. Patients will be encouraged to explore ways to assertively make their unbalanced needs known to significant others in their lives, using other group members and facilitator for support and feedback.  Therapeutic Goals: 1. Patient will identify two or more emotions or situations they have that consume much of in their lives. 2. Patient will identify signs/triggers that life has become out of balance:  3. Patient will identify two ways to set boundaries in order to achieve balance in their lives:  4. Patient will demonstrate ability to communicate their needs through discussion and/or role plays  Summary of Patient Progress:  [name] [name] participated some in today's group with CSW's prompting.  [name] [name] shared that fear has consumed much of his life.  [name] [name] did not chose to engage in the group discussion about signs/triggers that life has become out of balance and ways that he set boundaries to achieve more balance in his life.  [name] [name] left group for approximately 10 minutes, but did return to finish out the group.    Therapeutic Modalities:   Cognitive Behavioral Therapy Solution-Focused Therapy Assertiveness Training  Alease FrameSonya S Lainy Wrobleski, [place] 01/14/2018 2:40 PM

## 2018-01-14 NOTE — Progress Notes (Signed)
Patient is alert and oriented x 4, no distress noted, denies SI/HI/AVH, affect is flat but brightens upon approach, patient's thoughts are organized and coherent, he is interacting appropriately with peers and staff. Patient was offered support and encouraged to attend evening wrap up group. Patient did not attend group, 15 minutes safety checks maintained will continue to monitor.

## 2018-01-14 NOTE — Tx Team (Signed)
Interdisciplinary Treatment and Diagnostic Plan Update  01/14/2018 Time of Session: 8:30am [name] [name] MRN: [id number]  Principal Diagnosis: Schizophrenia, undifferentiated (HCC)  Secondary Diagnoses: Principal Problem:   Schizophrenia, undifferentiated (HCC) Active Problems:   Noncompliance   Current Medications:  Current Facility-Administered Medications  Medication Dose Route Frequency Provider Last Rate Last Dose  . acetaminophen (TYLENOL) tablet 650 mg  650 mg Oral Q6H PRN [name], [name], MD      . alum & mag hydroxide-simeth (MAALOX/MYLANTA) 200-200-20 MG/5ML suspension 30 mL  30 mL Oral Q4H PRN [name], [name], MD      . cloZAPine (CLOZARIL) tablet 300 mg  300 mg Oral QHS [name], [name], MD   300 mg at 01/13/18 2230  . hydrOXYzine (ATARAX/VISTARIL) tablet 50 mg  50 mg Oral TID PRN [name], [name], MD   50 mg at 01/11/18 1721  . magnesium hydroxide (MILK OF MAGNESIA) suspension 30 mL  30 mL Oral Daily PRN [name], [name], MD      . metoprolol tartrate (LOPRESSOR) tablet 12.5 mg  12.5 mg Oral BID [name], [name], MD   12.5 mg at 01/14/18 0835  . OLANZapine zydis (ZYPREXA) disintegrating tablet 20 mg  20 mg Oral QHS [name], [name], MD   20 mg at 01/13/18 2230  . traZODone (DESYREL) tablet 100 mg  100 mg Oral QHS PRN [name], [name], MD   100 mg at 01/12/18 2220   PTA Medications: Medications Prior to Admission  Medication Sig Dispense Refill Last Dose  . doxycycline (VIBRA-TABS) 100 MG tablet Take 1 tablet (100 mg total) by mouth 2 (two) times daily. (Patient not taking: Reported on 12/30/2017) 19 tablet 0 Not Taking at Unknown time  . HYDROcodone-acetaminophen (NORCO) 5-325 MG tablet Take 1 tablet by mouth every 6 (six) hours as needed. (Patient not taking: Reported on 12/30/2017) 10 tablet 0 Not Taking at Unknown time    Patient Stressors: Medication change or noncompliance  Patient Strengths: Capable of independent living Communication  skills Physical Health Supportive family/friends  Treatment Modalities: Medication Management, Group therapy, Case management,  1 to 1 session with clinician, Psychoeducation, Recreational therapy.   Physician Treatment Plan for Primary Diagnosis: Schizophrenia, undifferentiated (HCC) Long Term Goal(s): Improvement in symptoms so as ready for discharge NA   Short Term Goals: Ability to identify changes in lifestyle to reduce recurrence of condition will improve Ability to verbalize feelings will improve Ability to disclose and discuss suicidal ideas Ability to demonstrate self-control will improve Ability to identify and develop effective coping behaviors will improve Ability to maintain clinical measurements within normal limits will improve Compliance with prescribed medications will improve Ability to identify triggers associated with substance abuse/mental health issues will improve NA  Medication Management: Evaluate patient's response, side effects, and tolerance of medication regimen.  Therapeutic Interventions: 1 to 1 sessions, Unit Group sessions and Medication administration.  Evaluation of Outcomes: Progressing  Physician Treatment Plan for Secondary Diagnosis: Principal Problem:   Schizophrenia, undifferentiated (HCC) Active Problems:   Noncompliance  Long Term Goal(s): Improvement in symptoms so as ready for discharge NA   Short Term Goals: Ability to identify changes in lifestyle to reduce recurrence of condition will improve Ability to verbalize feelings will improve Ability to disclose and discuss suicidal ideas Ability to demonstrate self-control will improve Ability to identify and develop effective coping behaviors will improve Ability to maintain clinical measurements within normal limits will improve Compliance with prescribed medications will improve Ability to identify triggers associated with  substance abuse/mental health issues will improve NA      Medication Management: Evaluate patient's response, side effects, and tolerance of medication regimen.  Therapeutic Interventions: 1 to 1 sessions, Unit Group sessions and Medication administration.  Evaluation of Outcomes: Progressing   RN Treatment Plan for Primary Diagnosis: Schizophrenia, undifferentiated (HCC) Long Term Goal(s): Knowledge of disease and therapeutic regimen to maintain health will improve  Short Term Goals: Ability to verbalize feelings will improve, Ability to identify and develop effective coping behaviors will improve and Compliance with prescribed medications will improve  Medication Management: RN will administer medications as ordered by provider, will assess and evaluate patient's response and provide education to patient for prescribed medication. RN will report any adverse and/or side effects to prescribing provider.  Therapeutic Interventions: 1 on 1 counseling sessions, Psychoeducation, Medication administration, Evaluate responses to treatment, Monitor vital signs and CBGs as ordered, Perform/monitor CIWA, COWS, AIMS and Fall Risk screenings as ordered, Perform wound care treatments as ordered.  Evaluation of Outcomes: Progressing   LCSW Treatment Plan for Primary Diagnosis: Schizophrenia, undifferentiated (HCC) Long Term Goal(s): Safe transition to appropriate next level of care at discharge, Engage patient in therapeutic group addressing interpersonal concerns.  Short Term Goals: Engage patient in aftercare planning with referrals and resources, Increase social support, Facilitate acceptance of mental health diagnosis and concerns, Identify triggers associated with mental health/substance abuse issues and Increase skills for wellness and recovery  Therapeutic Interventions: Assess for all discharge needs, 1 to 1 time with Social worker, Explore available resources and support systems, Assess for adequacy in community support network, Educate family and  significant other(s) on suicide prevention, Complete Psychosocial Assessment, Interpersonal group therapy.  Evaluation of Outcomes: Progressing   Progress in Treatment: Attending groups: Yes. Participating in groups: Yes. Taking medication as prescribed: Yes. Toleration medication: Yes. Family/Significant other contact made: No, will contact:  Patient refused Patient understands diagnosis: Yes. Discussing patient identified problems/goals with staff: Yes. Medical problems stabilized or resolved: Yes. Denies suicidal/homicidal ideation: Yes. Issues/concerns per patient self-inventory: No. Other:   New problem(s) identified: No, Describe:  None  New Short Term/Long Term Goal(s): *Patient did not attend.*  Patient Goals:  *Patient did not attend.*  Discharge Plan or Barriers: To return home and follow up with outpatient treatment.  Reason for Continuation of Hospitalization: Medication stabilization  Estimated Length of Stay: 5 days  Recreational Therapy: Patient Stressors: N/A  Patient Goal: Patient will engage in interactions with peers and staff in pro-social manner at least 2x within 5 recreation therapy group sessions  Attendees: Patient: 01/14/2018 9:10 AM  Physician: Dr. Toni Amendlapacs MD 01/14/2018 9:10 AM  Nursing:Phyllis Allison Quarryobb, RN 01/14/2018 9:10 AM  RN Care Manager: 01/14/2018 9:10 AM  Social Worker: Johny Shearsassandra Kadeshia Kasparian, LCSWA 01/14/2018 9:10 AM  Recreational Therapist:  01/14/2018 9:10 AM  Other: Heidi DachKelsey Craig, LCSW 01/14/2018 9:10 AM  Other: Dr. Johnella MoloneyMcNew, MD 01/14/2018 9:10 AM  Other: Lamar LaundrySonya Carter,LCSW 01/14/2018 9:10 AM    Scribe for Treatment Team: Johny Shearsassandra  Lanier Felty, LCSW 01/14/2018 9:10 AM

## 2018-01-14 NOTE — BHH Counselor (Signed)
CSW spoke with the patient and informed him about the requirements for the [facility]. The patient reports that he is no longer interested in the program because he does not want disability. The patient reports that he will go back to his mothers house at discharge and will [facility]. He mentioned how he wanted to go back to school and says that is something that he wants to work on once he is discharged. Patient signed a consent for the CSW to contact [facility] to scheduled a follow up appointment. Patient reports that he will need transportation home at discharge. CSW will arrange for transportation. Patient reports that he is ready to be discharged.    Johny Shearsassandra Jodilyn Giese, MSW, Theresia MajorsLCSWA, Bridget HartshornLCASA Clinical Social Worker 01/14/2018 1:48 PM

## 2018-01-14 NOTE — Progress Notes (Signed)
Clozapine monitoring Consult   22 yo male current order for clozapine 300 mg PO at bedtime.  New restart.  Patient had been off meds for a while before admission  06/19  ANC 1800 06/26  ANC 1600  Information entered into Clozapine registry and pt eligible to receive clozapine Next labs due in a week - ordered for 07/03  Pharmacy will continue to follow.   Waldron LabsHayes,[name] [name], [facility] LLCRPH 01/14/2018 8:10 AM

## 2018-01-15 NOTE — Progress Notes (Signed)
Recreation Therapy Notes  Date: 01/15/2018  Time: 9:30 am   Location: Craft room   Behavioral response: N/A   Intervention Topic: Animal Assisted Therapy  Discussion/Intervention: Patient did not attend group.   Clinical Observations/Feedback:  Patient did not attend group.   Evrett Hakim LRT/CTRS        [name] [name] 01/15/2018 11:27 AM

## 2018-01-15 NOTE — Plan of Care (Signed)
Patient continues to pace the unit throughout the shift. Minimal with peers and staff interaction noted, did not attend any group  therapy sessions today. Patient denies having any active thoughts of wanting to kill himself or anyone else. Compliant with meals and medication. Complained of constipation states "I haven't pooped in a couple of days." PRN dose of MOM administered with pending results. Milieu remains safe.

## 2018-01-15 NOTE — Plan of Care (Addendum)
Patient found pacing the halls upon my arrival. Patient is visible but not social this evening. Attends group. Denies all complaints including SI/HI/AVH, depression, and anxiety. Minimally verbal but cooperative with assessment. Denies pain. Reports eating and voiding adequately. Compliant with HS medications and staff direction. Q 15 minute checks maintained. Will continue to monitor throughout the shift. Patient slept 7 hours. No apparent distress. Will endorse care to oncoming shift.  Problem: Education: Goal: Emotional status will improve Outcome: Progressing Goal: Mental status will improve Outcome: Progressing Goal: Verbalization of understanding the information provided will improve Outcome: Progressing   Problem: Coping: Goal: Ability to verbalize frustrations and anger appropriately will improve Outcome: Progressing Goal: Ability to demonstrate self-control will improve Outcome: Progressing   Problem: Coping: Goal: Coping ability will improve Outcome: Progressing

## 2018-01-15 NOTE — BHH Group Notes (Signed)
  01/15/2018  Time: 1PM  Type of Therapy/Topic:  Group Therapy:  Emotion Regulation  Participation Level:  Did Not Attend   Description of Group:    The purpose of this group is to assist patients in learning to regulate negative emotions and experience positive emotions. Patients will be guided to discuss ways in which they have been vulnerable to their negative emotions. These vulnerabilities will be juxtaposed with experiences of positive emotions or situations, and patients will be challenged to use positive emotions to combat negative ones. Special emphasis will be placed on coping with negative emotions in conflict situations, and patients will process healthy conflict resolution skills.  Therapeutic Goals: 1. Patient will identify two positive emotions or experiences to reflect on in order to balance out negative emotions 2. Patient will label two or more emotions that they find the most difficult to experience 3. Patient will demonstrate positive conflict resolution skills through discussion and/or role plays  Summary of Patient Progress: Pt was invited to attend group but chose not to attend. CSW will continue to encourage pt to attend group throughout their admission.     Therapeutic Modalities:   Cognitive Behavioral Therapy Feelings Identification Dialectical Behavioral Therapy  Tyler Cubit, MSW, LCSW Clinical Social Worker 01/15/2018 1:58 PM   

## 2018-01-15 NOTE — Progress Notes (Signed)
[facility] MD Progress Note  01/15/2018 6:48 PM [name] [name]  MRN:  [id number] Subjective: Follow-up for 22 year old man with a history of schizophrenia.  Patient has no new complaints today.  Affect mostly blunted although does not appear to be depressed.  Does not make psychotic statements but still seems a little withdrawn.  Nevertheless taking care of his ADLs okay Principal Problem: Schizophrenia, undifferentiated (HCC) Diagnosis:   Patient Active Problem List   Diagnosis Date Noted  . Schizophrenia, undifferentiated (HCC) [F20.3] 12/30/2017  . Noncompliance [Z91.19] 12/30/2017  . Aggressive behavior of adolescent [R46.89] 10/24/2015   Total Time spent with patient: 20 minutes  Past Psychiatric History: History of schizophrenia and noncompliance several hospitalizations  Past Medical History:  Past Medical History:  Diagnosis Date  . Asthma   . Depression   . Psychosis [facility])     Past Surgical History:  Procedure Laterality Date  . BACK SURGERY     Family History: History reviewed. No pertinent family history. Family Psychiatric  History: Negative Social History:  Social History   Substance and Sexual Activity  Alcohol Use No     Social History   Substance and Sexual Activity  Drug Use Yes  . Types: Marijuana    Social History   Socioeconomic History  . Marital status: Single    Spouse name: Not on file  . Number of children: Not on file  . Years of education: Not on file  . Highest education level: Not on file  Occupational History  . Not on file  Social Needs  . Financial resource strain: Not on file  . Food insecurity:    Worry: Not on file    Inability: Not on file  . Transportation needs:    Medical: Not on file    Non-medical: Not on file  Tobacco Use  . Smoking status: Never Smoker  . Smokeless tobacco: Never Used  Substance and Sexual Activity  . Alcohol use: No  . Drug use: Yes    Types: Marijuana  . Sexual activity: Not Currently   Lifestyle  . Physical activity:    Days per week: Not on file    Minutes per session: Not on file  . Stress: Not on file  Relationships  . Social connections:    Talks on phone: Not on file    Gets together: Not on file    Attends religious service: Not on file    Active member of club or organization: Not on file    Attends meetings of clubs or organizations: Not on file    Relationship status: Not on file  Other Topics Concern  . Not on file  Social History Narrative  . Not on file   Additional Social History:                         Sleep: Fair  Appetite:  Fair  Current Medications: Current Facility-Administered Medications  Medication Dose Route Frequency Provider Last Rate Last Dose  . acetaminophen (TYLENOL) tablet 650 mg  650 mg Oral Q6H PRN Shamyra Farias T, MD      . alum & mag hydroxide-simeth (MAALOX/MYLANTA) 200-200-20 MG/5ML suspension 30 mL  30 mL Oral Q4H PRN Kiira Brach T, MD      . cloZAPine (CLOZARIL) tablet 300 mg  300 mg Oral QHS [name], [name], MD   300 mg at 01/14/18 2150  . hydrOXYzine (ATARAX/VISTARIL) tablet 50 mg  50 mg Oral TID PRN Jazsmin Couse,  [name], MD   50 mg at 01/11/18 1721  . magnesium hydroxide (MILK OF MAGNESIA) suspension 30 mL  30 mL Oral Daily PRN Duha Abair T, MD   30 mL at 01/15/18 1308  . metoprolol tartrate (LOPRESSOR) tablet 12.5 mg  12.5 mg Oral BID [name], [name], MD   12.5 mg at 01/15/18 1658  . traZODone (DESYREL) tablet 100 mg  100 mg Oral QHS PRN Tacarra Justo, [name], MD   100 mg at 01/14/18 2150    Lab Results:  Results for orders placed or performed during the hospital encounter of 12/30/17 (from the past 48 hour(s))  CBC with Differential/Platelet     Status: None   Collection Time: 01/14/18  7:20 AM  Result Value Ref Range   WBC 4.0 3.8 - 10.6 K/uL   RBC 5.65 4.40 - 5.90 MIL/uL   Hemoglobin 16.3 13.0 - 18.0 g/dL   HCT 13.046.9 86.540.0 - 78.452.0 %   MCV 83.1 80.0 - 100.0 fL   MCH 28.8 26.0 - 34.0 pg   MCHC  34.6 32.0 - 36.0 g/dL   RDW 69.613.3 29.511.5 - 28.414.5 %   Platelets 222 150 - 440 K/uL   Neutrophils Relative % 41 %   Neutro Abs 1.6 1.4 - 6.5 K/uL   Lymphocytes Relative 44 %   Lymphs Abs 1.8 1.0 - 3.6 K/uL   Monocytes Relative 15 %   Monocytes Absolute 0.6 0.2 - 1.0 K/uL   Eosinophils Relative 0 %   Eosinophils Absolute 0.0 0 - 0.7 K/uL   Basophils Relative 0 %   Basophils Absolute 0.0 0 - 0.1 K/uL    Comment: Performed at [facility], [address] [place], [place] [address]    Blood Alcohol level:  Lab Results  Component Value Date   [facility]   ETH <5 09/29/2016    Metabolic Disorder Labs: Lab Results  Component Value Date   HGBA1C 4.6 (L) 12/30/2017   MPG 85.32 12/30/2017   No results found for: PROLACTIN Lab Results  Component Value Date   CHOL 185 12/30/2017   TRIG 125 12/30/2017   HDL 63 12/30/2017   CHOLHDL 2.9 12/30/2017   VLDL 25 12/30/2017   LDLCALC 97 12/30/2017    Physical Findings: AIMS:  , ,  ,  ,    CIWA:    COWS:     Musculoskeletal: Strength & Muscle Tone: within normal limits Gait & Station: normal Patient leans: N/A  Psychiatric Specialty Exam: Physical Exam  Nursing note and vitals reviewed. Constitutional: He appears well-developed and well-nourished.  HENT:  Head: Normocephalic and atraumatic.  Eyes: Pupils are equal, round, and reactive to light. Conjunctivae are normal.  Neck: Normal range of motion.  Cardiovascular: Regular rhythm and normal heart sounds.  Respiratory: Effort normal.  GI: Soft.  Musculoskeletal: Normal range of motion.  Neurological: He is alert.  Skin: Skin is warm and dry.  Psychiatric: His affect is blunt. His speech is delayed. He is slowed. He expresses inappropriate judgment. He expresses no homicidal and no suicidal ideation. He exhibits abnormal recent memory.    Review of Systems  Constitutional: Negative.   HENT: Negative.   Eyes: Negative.   Respiratory: Negative.    Cardiovascular: Negative.   Gastrointestinal: Negative.   Musculoskeletal: Negative.   Skin: Negative.   Neurological: Negative.   Psychiatric/Behavioral: Negative.     Blood pressure (!) 152/91, pulse (!) 116, temperature (!) 97.5 F (36.4 C), temperature source Oral, resp. rate  18, height 6\' 2"  (1.88 m), weight 83.5 kg (184 lb), SpO2 100 %.Body mass index is 23.62 kg/m.  General Appearance: Casual  Eye Contact:  Good  Speech:  Clear and Coherent  Volume:  Decreased  Mood:  Euthymic  Affect:  Constricted  Thought Process:  Coherent  Orientation:  Full (Time, Place, and Person)  Thought Content:  Logical  Suicidal Thoughts:  No  Homicidal Thoughts:  No  Memory:  Immediate;   Fair Recent;   Fair Remote;   Fair  Judgement:  Fair  Insight:  Lacking  Psychomotor Activity:  Decreased  Concentration:  Concentration: Fair  Recall:  [employer] of Knowledge:  Fair  Language:  Fair  Akathisia:  No  Handed:  Right  AIMS (if indicated):     Assets:  Desire for Improvement Housing Physical Health Resilience  ADL's:  Intact  Cognition:  Impaired,  Mild  Sleep:  Number of Hours: 7     Treatment Plan Summary: Daily contact with patient to assess and evaluate symptoms and progress in treatment, Medication management and Plan I told the patient today that he really needed to get in touch with his mother because I was not going to feel enthusiastic about discharging him until we were sure that he would be going home with his mother and following up with local mental health.  He agreed to do this.  Case reviewed with full treatment team.  Meanwhile I am stopping the last of the Zyprexa so we can switch over completely to clozapine.  Mordecai Rasmussen, MD 01/15/2018, 6:48 PM

## 2018-01-15 NOTE — BHH Group Notes (Signed)
LCSW Group Therapy Note 01/15/2018 9:00 AM  Type of Therapy and Topic:  Group Therapy:  Setting Goals  Participation Level:  Did Not Attend  Description of Group: In this process group, patients discussed using strengths to work toward goals and address challenges.  Patients identified two positive things about themselves and one goal they were working on.  Patients were given the opportunity to share openly and support each other's plan for self-empowerment.  The group discussed the value of gratitude and were encouraged to have a daily reflection of positive characteristics or circumstances.  Patients were encouraged to identify a plan to utilize their strengths to work on current challenges and goals.  Therapeutic Goals 1. Patient will verbalize personal strengths/positive qualities and relate how these can assist with achieving desired personal goals 2. Patients will verbalize affirmation of peers plans for personal change and goal setting 3. Patients will explore the value of gratitude and positive focus as related to successful achievement of goals 4. Patients will verbalize a plan for regular reinforcement of personal positive qualities and circumstances.  Summary of Patient Progress:  [name] [name] was invited to today's group, but chose not to attend.     Therapeutic Modalities Cognitive Behavioral Therapy Motivational Interviewing    [name] FrameSonya S Balin [name], [place] 01/15/2018 9:47 AM

## 2018-01-15 NOTE — BHH Counselor (Signed)
CSW called [facility] to schedule a hospital follow up appointment for the patient. The patient was discharged from their program last year. The receptionist reports that they only take patients within the first 3 years of their psychotic break. They were informed that the patient no long wanted to participate in their program. She says that his last completed appointment was on November 12, 2016 with Dr. Marcelina Morelharguia. He missed a total of 8 appointments in the month of May 2018. She says that he cannot come back to the program. CSW will schedule the patient for [place].  Johny Shearsassandra Lavern Maslow, MSW, Theresia MajorsLCSWA, Bridget HartshornLCASA Clinical Social Worker 01/15/2018 11:50 AM

## 2018-01-16 NOTE — BHH Counselor (Signed)
CSW spoke with the patient to discuss discharge plans. The patient reports that he will go to [facility] for [facility] Support Services with Unk PintoHarvey Bryant and medication management. The patient also gave approval to call his mother. Both consents were obtained and signed.   CSW attempted to call the patients mother Iva Boopiman Babikir, [phone] to discuss discharge plans and follow up. CSW left a message with a callback number.  Johny Shearsassandra Martavius Lusty, MSW, Theresia MajorsLCSWA, Bridget HartshornLCASA Clinical Social Worker 01/16/2018 10:08 AM

## 2018-01-16 NOTE — Progress Notes (Signed)
Recreation Therapy Notes  Date: 01/16/2018  Time: 9:30 am  Location: Outside  Behavioral response: Appropriate  Intervention Topic: Leisure  Discussion/Intervention: Group content today was focused on leisure. The group defined what leisure is and some positive leisure activities they participate in. Individuals identified the difference between good and bad leisure. Participants expressed how they feel after participating in the leisure of their choice. The group discussed how they go about picking a leisure activity and if others are involved in their leisure activities. The patient stated how many leisure activities they too choose from and reasons why it is important to have leisure time. Individuals participated in the intervention "Exploring Leisure" where they had a chance to participate in and identify new leisure activities as well as benefits of leisure. Clinical Observations/Feedback:  Patient came to group and  Was focused on what his peers and staff had to say about leisure. He participated in the intervention and was social with peers and staff during group.    [name] [name] LRT/CTRS           [name] 01/16/2018 11:53 AM

## 2018-01-16 NOTE — BHH Group Notes (Signed)
01/16/2018 1PM  Type of Therapy and Topic:  Group Therapy:  Feelings around Relapse and Recovery  Participation Level:  Active   Description of Group:    Patients in this group will discuss emotions they experience before and after a relapse. They will process how experiencing these feelings, or avoidance of experiencing them, relates to having a relapse. Facilitator will guide patients to explore emotions they have related to recovery. Patients will be encouraged to process which emotions are more powerful. They will be guided to discuss the emotional reaction significant others in their lives may have to patients' relapse or recovery. Patients will be assisted in exploring ways to respond to the emotions of others without this contributing to a relapse.  Therapeutic Goals: 1. Patient will identify two or more emotions that lead to a relapse for them 2. Patient will identify two emotions that result when they relapse 3. Patient will identify two emotions related to recovery 4. Patient will demonstrate ability to communicate their needs through discussion and/or role plays   Summary of Patient Progress: Actively and appropriately engaged in the group. Patient was able to provide support and validation to other group members.Patient practiced active listening when interacting with the facilitator and other group members. Patient reports that listening to upbeat music helps him during his recovery. Patient is still in the process of obtaining treatment goals.      Therapeutic Modalities:   Cognitive Behavioral Therapy Solution-Focused Therapy Assertiveness Training Relapse Prevention Therapy   Johny ShearsCassandra  Jordanny Waddington, LCSW 01/16/2018 2:50 PM

## 2018-01-16 NOTE — Progress Notes (Signed)
[facility]  01/16/2018 5:45 PM [name] [name]  MRN:  [id number] Subjective: "I think I am okay".  Patient seen chart reviewed.  Follow-up for 22 year old man with schizophrenia.  Patient continues to have no complaints and to only focus in conversation about wanting to be discharged.  He still has not gotten in touch with his mother but he has given permission to social work to try and speak with his family and arrange for disposition.  Patient attends groups but is quiet and superficial.  No sign of any agitation.  His insight is still partial at best.  Of concern, his absolute neutrophil count has dropped from last week and is now just barely above the cutoff limit.  Patient has no physical complaints Principal Problem: Schizophrenia, undifferentiated (HCC) Diagnosis:   Patient Active Problem List   Diagnosis Date Noted  . Schizophrenia, undifferentiated (HCC) [F20.3] 12/30/2017  . Noncompliance [Z91.19] 12/30/2017  . Aggressive behavior of adolescent [R46.89] 10/24/2015   Total Time spent with patient: 20 minutes  Past Psychiatric History: History of schizophrenia has had a few admissions now because of noncompliance and poor insight  Past Medical History:  Past Medical History:  Diagnosis Date  . Asthma   . Depression   . Psychosis [facility](HCC)     Past Surgical History:  Procedure Laterality Date  . BACK SURGERY     Family History: History reviewed. No pertinent family history. Family Psychiatric  History: See previous note Social History:  Social History   Substance and Sexual Activity  Alcohol Use No     Social History   Substance and Sexual Activity  Drug Use Yes  . Types: Marijuana    Social History   Socioeconomic History  . Marital status: Single    Spouse name: Not on file  . Number of children: Not on file  . Years of education: Not on file  . Highest education level: Not on file  Occupational History  . Not on file  Social Needs  . Financial  resource strain: Not on file  . Food insecurity:    Worry: Not on file    Inability: Not on file  . Transportation needs:    Medical: Not on file    Non-medical: Not on file  Tobacco Use  . Smoking status: Never Smoker  . Smokeless tobacco: Never Used  Substance and Sexual Activity  . Alcohol use: No  . Drug use: Yes    Types: Marijuana  . Sexual activity: Not Currently  Lifestyle  . Physical activity:    Days per week: Not on file    Minutes per session: Not on file  . Stress: Not on file  Relationships  . Social connections:    Talks on phone: Not on file    Gets together: Not on file    Attends religious service: Not on file    Active member of club or organization: Not on file    Attends meetings of clubs or organizations: Not on file    Relationship status: Not on file  Other Topics Concern  . Not on file  Social History Narrative  . Not on file   Additional Social History:                         Sleep: Good  Appetite:  Good  Current Medications: Current Facility-Administered Medications  Medication Dose Route Frequency Provider Last Rate Last Dose  . acetaminophen (TYLENOL) tablet 650  mg  650 mg Oral Q6H PRN Ariston Grandison T, MD      . alum & mag hydroxide-simeth (MAALOX/MYLANTA) 200-200-20 MG/5ML suspension 30 mL  30 mL Oral Q4H PRN Aimie Wagman T, MD      . cloZAPine (CLOZARIL) tablet 300 mg  300 mg Oral QHS [name], [name], MD   300 mg at 01/15/18 2058  . hydrOXYzine (ATARAX/VISTARIL) tablet 50 mg  50 mg Oral TID PRN Iran Rowe, [name], MD   50 mg at 01/15/18 2058  . magnesium hydroxide (MILK OF MAGNESIA) suspension 30 mL  30 mL Oral Daily PRN Mareta Chesnut T, MD   30 mL at 01/15/18 1308  . metoprolol tartrate (LOPRESSOR) tablet 12.5 mg  12.5 mg Oral BID [name], [name], MD   12.5 mg at 01/16/18 1613  . traZODone (DESYREL) tablet 100 mg  100 mg Oral QHS PRN Sharie Amorin, [name], MD   100 mg at 01/15/18 2058    Lab Results: No results found  for this or any previous visit (from the past 48 hour(s)).  Blood Alcohol level:  Lab Results  Component Value Date   ETH <10 12/30/2017   ETH <5 09/29/2016    Metabolic Disorder Labs: Lab Results  Component Value Date   HGBA1C 4.6 (L) 12/30/2017   MPG 85.32 12/30/2017   No results found for: PROLACTIN Lab Results  Component Value Date   CHOL 185 12/30/2017   TRIG 125 12/30/2017   HDL 63 12/30/2017   CHOLHDL 2.9 12/30/2017   VLDL 25 12/30/2017   LDLCALC 97 12/30/2017    Physical Findings: AIMS:  , ,  ,  ,    CIWA:    COWS:     Musculoskeletal: Strength & Muscle Tone: within normal limits Gait & Station: normal Patient leans: N/A  Psychiatric Specialty Exam: Physical Exam  Nursing note and vitals reviewed. Constitutional: He appears well-developed and well-nourished.  HENT:  Head: Normocephalic and atraumatic.  Eyes: Pupils are equal, round, and reactive to light. Conjunctivae are normal.  Neck: Normal range of motion.  Cardiovascular: Regular rhythm and normal heart sounds.  Respiratory: Effort normal. No respiratory distress.  GI: Soft.  Musculoskeletal: Normal range of motion.  Neurological: He is alert.  Skin: Skin is warm and dry.  Psychiatric: His affect is blunt. His speech is delayed. He is slowed. Thought content is not paranoid. Cognition and memory are impaired. He expresses inappropriate judgment. He expresses no homicidal and no suicidal ideation.    Review of Systems  Constitutional: Negative.   HENT: Negative.   Eyes: Negative.   Respiratory: Negative.   Cardiovascular: Negative.   Gastrointestinal: Negative.   Musculoskeletal: Negative.   Skin: Negative.   Neurological: Negative.   Psychiatric/Behavioral: Negative for depression, hallucinations, memory loss, substance abuse and suicidal ideas. The patient is not nervous/anxious and does not have insomnia.     Blood pressure 137/81, pulse (!) 118, temperature 97.8 F (36.6 C), temperature  source Oral, resp. rate 18, height 6\' 2"  (1.88 m), weight 83.5 kg (184 lb), SpO2 100 %.Body mass index is 23.62 kg/m.  General Appearance: Casual  Eye Contact:  Good  Speech:  Slow  Volume:  Decreased  Mood:  Euthymic  Affect:  Constricted  Thought Process:  Goal Directed  Orientation:  Full (Time, Place, and Person)  Thought Content:  Tangential  Suicidal Thoughts:  No  Homicidal Thoughts:  No  Memory:  Immediate;   Fair Recent;   Fair Remote;   Fair  Judgement:  Impaired  Insight:  Shallow  Psychomotor Activity:  Decreased  Concentration:  Concentration: Fair  Recall:  [employer] of Knowledge:  Fair  Language:  Fair  Akathisia:  No  Handed:  Right  AIMS (if indicated):     Assets:  Desire for Improvement Housing Physical Health Resilience  ADL's:  Intact  Cognition:  Impaired,  Mild  Sleep:  Number of Hours: 6.25     Treatment Plan Summary: Daily contact with patient to assess and evaluate symptoms and progress in treatment, Medication management and Plan Patient has shown some improvement although he still has significant negative symptoms and poor insight.  Continues to be high risk for poor outcome outside the hospital.  I restated to him I believe that having good family support at this stage would be crucial especially since he does not have other financial support.  We are working with social work to try and arrange for a safe disposition.  The absolute neutrophil count is a concern.  Next week we may have to consider whether to keep him for another week to reassess it.  If the ANC continues to drop clozapine will have to be discontinued.  No change to medicine for today.  Mordecai Rasmussen, MD 01/16/2018, 5:45 PM

## 2018-01-16 NOTE — Plan of Care (Addendum)
Patient found in day room upon my arrival. Patient is visible and social this evening. Patient condition continues to improve. Patient affect is brighter and he is more verbal. Patient continues to pace at times. Given Vistaril for restlessness. Denies anxiety, depression, and SI/HI/AVH. Reports eating and voiding adequately. Denies pain. Compliant with HS medications and staff direction. Given Trazodone for sleep with positive results. Patient slept 6.5 hours. No apparent distress. Will endorse care to oncoming shift.  Problem: Education: Goal: Emotional status will improve Outcome: Progressing Goal: Mental status will improve Outcome: Progressing Goal: Verbalization of understanding the information provided will improve Outcome: Progressing   Problem: Coping: Goal: Ability to demonstrate self-control will improve Outcome: Progressing   Problem: Health Behavior/Discharge Planning: Goal: Compliance with treatment plan for underlying cause of condition will improve Outcome: Progressing   Problem: Coping: Goal: Coping ability will improve Outcome: Progressing Goal: Will verbalize feelings Outcome: Progressing

## 2018-01-16 NOTE — Plan of Care (Signed)
Patient continues to pace the unit with steady gait. Selective peer interaction with peers and staff observed today. Patient seen laughing and smiling with peers today during a brief interaction. Affect is blunted but brightens upon interaction with this Clinical research associatewriter. Compliant with medications and meals. Denies SI/HI/AVH and pain at this time. Milieu remains safe at this time with q 15 minute safety checks.

## 2018-01-17 DIAGNOSIS — Z9119 Patient's noncompliance with other medical treatment and regimen: Secondary | ICD-10-CM

## 2018-01-17 NOTE — Progress Notes (Signed)
Received [name] [name] this AM after breakfast, he was compliant with his medication. He has been OOB in the milieu most of the day, pacing and asking questions at intervals. He has been constantly hunger and forgetting he ate an meal. He is pleasant and non violent.

## 2018-01-17 NOTE — Progress Notes (Signed)
[facility] MD Progress Note  01/17/2018 7:38 AM [name] [name]  MRN:  [id number]  Subjective:   The patient is doing fairly well this morning. Appetite is fairly good. He has been attending groups regularly and is learning to cope with being in the hospital.He still does not fully accept his diagnosis but says that he will accept treatment and take medications. So far he is tolerating Clozaril fairly well.He denies any current active or passive suicidal thoughts but is withdrawn and quiet. The patient is visible on the unit but does not interact a lot with peers.Per nursing, no behavioral problems. He denies any auditory or visual hallucinatons. No paranoid thoughts or delusions. He denies any somatic complaints.  Past psychiatric history. Diagnosed with schizophrenia 2 years ago and hospitalized at [facility] on adolescent unit. He then followed up with [facility]. According to the mother, he had a terrible reaction to Haldol or Abilify and had to spend 3 months in the hospital.He only responds to Clozapine but started refusing lab work. He was in the care of [facility] as long as he had Medicaid. Medicaid was reinstated now.  Family psychiatric history. None.  Social history. Dropped out of school due to mental illness but got GEDs. He lives with his miother and brother. He is unemployed.    Principal Problem: Schizophrenia, undifferentiated (HCC) Diagnosis:   Patient Active Problem List   Diagnosis Date Noted  . Schizophrenia, undifferentiated (HCC) [F20.3] 12/30/2017  . Noncompliance [Z91.19] 12/30/2017  . Aggressive behavior of adolescent [R46.89] 10/24/2015   Total Time spent with patient: 20 minutes  Past Psychiatric History: schizophrenia  Past Medical History:  Past Medical History:  Diagnosis Date  . Asthma   . Depression   . Psychosis [facility])     Past Surgical History:  Procedure Laterality Date  . BACK SURGERY     Family History: History reviewed. No pertinent family  history. Family Psychiatric  History: none Social History:  Social History   Substance and Sexual Activity  Alcohol Use No     Social History   Substance and Sexual Activity  Drug Use Yes  . Types: Marijuana    Social History   Socioeconomic History  . Marital status: Single    Spouse name: Not on file  . Number of children: Not on file  . Years of education: Not on file  . Highest education level: Not on file  Occupational History  . Not on file  Social Needs  . Financial resource strain: Not on file  . Food insecurity:    Worry: Not on file    Inability: Not on file  . Transportation needs:    Medical: Not on file    Non-medical: Not on file  Tobacco Use  . Smoking status: Never Smoker  . Smokeless tobacco: Never Used  Substance and Sexual Activity  . Alcohol use: No  . Drug use: Yes    Types: Marijuana  . Sexual activity: Not Currently  Lifestyle  . Physical activity:    Days per week: Not on file    Minutes per session: Not on file  . Stress: Not on file  Relationships  . Social connections:    Talks on phone: Not on file    Gets together: Not on file    Attends religious service: Not on file    Active member of club or organization: Not on file    Attends meetings of clubs or organizations: Not on file  Relationship status: Not on file  Other Topics Concern  . Not on file  Social History Narrative  . Not on file      Sleep: Good  Appetite:  Fair  Current Medications: Current Facility-Administered Medications  Medication Dose Route Frequency Provider Last Rate Last Dose  . acetaminophen (TYLENOL) tablet 650 mg  650 mg Oral Q6H PRN [name], [name], MD      . alum & mag hydroxide-simeth (MAALOX/MYLANTA) 200-200-20 MG/5ML suspension 30 mL  30 mL Oral Q4H PRN [name], [name], MD      . cloZAPine (CLOZARIL) tablet 300 mg  300 mg Oral QHS [name], [name], MD   300 mg at 01/16/18 2131  . hydrOXYzine (ATARAX/VISTARIL) tablet 50 mg  50 mg Oral  TID PRN [name], [name], MD   50 mg at 01/15/18 2058  . magnesium hydroxide (MILK OF MAGNESIA) suspension 30 mL  30 mL Oral Daily PRN [name], [name], MD   30 mL at 01/15/18 1308  . metoprolol tartrate (LOPRESSOR) tablet 12.5 mg  12.5 mg Oral BID [name], [name], MD   12.5 mg at 01/16/18 1613  . traZODone (DESYREL) tablet 100 mg  100 mg Oral QHS PRN [name], [name], MD   100 mg at 01/16/18 2131    Lab Results: No results found for this or any previous visit (from the past 48 hour(s)).  Blood Alcohol level:  Lab Results  Component Value Date   ETH <10 12/30/2017   ETH <5 09/29/2016    Metabolic Disorder Labs: Lab Results  Component Value Date   HGBA1C 4.6 (L) 12/30/2017   MPG 85.32 12/30/2017   No results found for: PROLACTIN Lab Results  Component Value Date   CHOL 185 12/30/2017   TRIG 125 12/30/2017   HDL 63 12/30/2017   CHOLHDL 2.9 12/30/2017   VLDL 25 12/30/2017   LDLCALC 97 12/30/2017     Musculoskeletal: Strength & Muscle Tone: within normal limits Gait & Station: normal Patient leans: N/A  Psychiatric Specialty Exam: Physical Exam  Nursing note and vitals reviewed. Psychiatric: His speech is normal. Judgment and thought content normal. His affect is blunt. He is slowed and withdrawn. Cognition and memory are normal.    Review of Systems  Constitutional: Negative.   HENT: Negative.   Eyes: Negative.   Respiratory: Negative.   Cardiovascular: Negative.   Gastrointestinal: Negative.   Genitourinary: Negative.   Skin: Negative.   Neurological: Negative.   Endo/Heme/Allergies: Negative.   Psychiatric/Behavioral: Positive for substance abuse.    Blood pressure 136/89, pulse 86, temperature 97.7 F (36.5 C), temperature source Oral, resp. rate 20, height 6\' 2"  (1.88 m), weight 83.5 kg (184 lb), SpO2 100 %.Body mass index is 23.62 kg/m.  General Appearance: Casual  Eye Contact:  Faily good  Speech:  Clear and Coherent  Volume:  Decreased  Mood:   "OK"  Affect:  Blunt  Thought Process:  Goal Directed and Linear  Orientation:  Full (Time, Place, and Person)  Thought Content:  Logical; no paranoid thoughts  Suicidal Thoughts:  No  Homicidal Thoughts:  No  Memory:  Immediate;   Fair Recent;   Fair Remote;   Fair  Judgement:  Poor  Insight:  Lacking  Psychomotor Activity:  Psychomotor Retardation  Concentration:  Concentration: Fair and Attention Span: Fair  Recall:  [employer] of Knowledge:  Fair  Language:  Fair  Akathisia:  No  Handed:  Right  AIMS (if indicated):  Assets:  Communication Skills Desire for Improvement Financial Resources/Insurance Physical Health Resilience Social Support  ADL's:  Intact  Cognition:  WNL  Sleep:  Number of Hours: 7.75     Treatment Plan Summary: Daily contact with patient to assess and evaluate symptoms and progress in treatment and Medication management   [name] is a 22 year old male with a history of schizophrenia admitted for psychotic break in the context of treatment noncomplinace. He is still on Clozapine.    #Psychosis -The patient is no longer psychotic -Zyprexa has been tapered down and he will continue on Clozaril 300 mg by mouth nightly -Will continue trazodone 100 mg by mouth nightly for insomnia -EKG showed a QTC of 374  #Tachycardia and HTN, improved -vital signs are stable -continue start Metoprolol12.5 mg BID   #Labs -lipid panel, TSH and A1Care unremerkable -EKGreviewed, NSR with QTc 374  #Disposition -Anticipate discharge this week. -He will need to follow up with [facility] act [facility] -the patient plans to return home to live with his mother and brother    Darliss Ridgelarti K Glendoris Nodarse, MD 01/17/2018, 7:38 AM

## 2018-01-17 NOTE — [facility] Group Notes (Signed)
LCSW Group Therapy Note  01/17/2018 1:15pm  Type of Therapy and Topic: Group Therapy: Holding on to Grudges   Participation Level: Minimal   Description of Group:  In this group patients will be asked to explore and define a grudge. Patients will be guided to discuss their thoughts, feelings, and reasons as to why people have grudges. Patients will process the impact grudges have on daily life and identify thoughts and feelings related to holding grudges. Facilitator will challenge patients to identify ways to let go of grudges and the benefits this provides. Patients will be confronted to address why one struggles letting go of grudges. Lastly, patients will identify feelings and thoughts related to what life would look like without grudges. This group will be process-oriented, with patients participating in exploration of their own experiences, giving and receiving support, and processing challenge from other group members.  Therapeutic Goals:  1. Patient will identify specific grudges related to their personal life.  2. Patient will identify feelings, thoughts, and beliefs around grudges.  3. Patient will identify how one releases grudges appropriately.  4. Patient will identify situations where they could have let go of the grudge, but instead chose to hold on.   Summary of Patient Progress:  The patient reported he feels "well." He stated he does not hold any grudges.   Therapeutic Modalities:  Cognitive Behavioral Therapy  Solution Focused Therapy  Motivational Interviewing  Brief Therapy   Hanaan Gancarz  CUEBAS-COLON, LCSW 01/17/2018 12:56 PM

## 2018-01-17 NOTE — Plan of Care (Signed)
Patient is improving in all areas participating minimally in scheduled activities, compliant with his medicines and voice no complain, appetite is good and sleep long hours with out any disturbances, support and encourage is accorded to patient , contract for safety denies any SI/HI and no signs of AVH noted 15 minute safety round is maintained. Problem: Education: Goal: Knowledge of [place] [facility] Education information/materials will improve Outcome: Progressing Goal: Emotional status will improve Outcome: Progressing Goal: Mental status will improve Outcome: Progressing Goal: Verbalization of understanding the information provided will improve Outcome: Progressing   Problem: Coping: Goal: Ability to verbalize frustrations and anger appropriately will improve Outcome: Progressing Goal: Ability to demonstrate self-control will improve Outcome: Progressing   Problem: Health Behavior/Discharge Planning: Goal: Identification of resources available to assist in meeting health care needs will improve Outcome: Progressing Goal: Compliance with treatment plan for underlying cause of condition will improve Outcome: Progressing   Problem: Elimination: Goal: Will not experience complications related to bowel motility Outcome: Progressing   Problem: Activity: Goal: Will verbalize the importance of balancing activity with adequate rest periods Outcome: Progressing   Problem: Coping: Goal: Coping ability will improve Outcome: Progressing Goal: Will verbalize feelings Outcome: Progressing

## 2018-01-18 NOTE — [facility] Group Notes (Signed)
[facility] Group Notes:  (Nursing/MHT/Case Management/Adjunct)  Date:  01/18/2018  Time:  9:53 PM  Type of Therapy:  Group Therapy  Participation Level:  Active  Participation Quality:  Appropriate  Affect:  Appropriate  Cognitive:  Appropriate  Insight:  Appropriate  Engagement in Group:  Engaged  Modes of Intervention:  Discussion  Summary of Progress/Problems: MHT informed group of rules and expectations of the unit. Group made introductions and discussed goal for the day. MHT processed with group about barriers to accomplishing goals. MHT encouraged group to discuss problems experienced before coming and developing a plan to address upon discharge. MHT explained each person needed to work on the problem that was the reason admitted. MHT encouraged medication compliance and discussed the intended purpose of taking medications. MHT encouraged patients to follow up with recommended treatment upon discharge. MHT informed group [facility] if they did not have [facility]. [name] [name] stated he accomplished his goal. [name] [name] stated he wanted to have a nice day. [name] [name] did not have much of anything else to report.  [name] [name] listened to other patients share without interrupting. [name] [name] 01/18/2018, 9:53 PM

## 2018-01-18 NOTE — Progress Notes (Signed)
D:Patient  pacing halls through shift . Patient walk in room turn around and walk out . Unable to stay in group therapy this afternoon . Again he walked in turned around and walk out  Emotional and mental status improving .  Complying with staff  ,Improvement  with  information given  with understanding  . Staff  continue  to  instructed patient  in concretely for  better understanding . Frustration and anger  improved  able to demonstrate self control  concerns  Patient  Has been on the  Unit for 18 days , has given his code to mother today . Requested his mother call back at phone times.  noted to wear the same clothing  From yesterday. Appetite good and voice no other concerns . Noted very needy with other  Staff and patient on the unit .Staff observed patient following them around . Patient denies hearing voices .    A: Encourage patient participation with unit programming . Instruction  Given on  Medication , verbalize understanding. R: Voice no other concerns. Staff continue to monitor

## 2018-01-18 NOTE — Plan of Care (Signed)
Emotional and mental status improving .  Complying with staff  ,Improvement  with  information given  with understanding  . Staff  continue  to  instructed patient  in concretely for  better understanding . Frustration and anger  improved  able to demonstrate self control  concerns    Problem: Coping: Goal: Coping ability will improve Outcome: Progressing Goal: Will verbalize feelings Outcome: Progressing   Problem: Activity: Goal: Will verbalize the importance of balancing activity with adequate rest periods Outcome: Progressing   Problem: Elimination: Goal: Will not experience complications related to bowel motility Outcome: Progressing   Problem: Elimination: Goal: Will not experience complications related to bowel motility Outcome: Progressing   Problem: Health Behavior/Discharge Planning: Goal: Identification of resources available to assist in meeting health care needs will improve Outcome: Progressing Goal: Compliance with treatment plan for underlying cause of condition will improve Outcome: Progressing   Problem: Coping: Goal: Ability to verbalize frustrations and anger appropriately will improve Outcome: Progressing Goal: Ability to demonstrate self-control will improve Outcome: Progressing   Problem: Education: Goal: Knowledge of [place] [facility] Education information/materials will improve Outcome: Progressing Goal: Emotional status will improve Outcome: Progressing Goal: Mental status will improve Outcome: Progressing Goal: Verbalization of understanding the information provided will improve Outcome: Progressing

## 2018-01-18 NOTE — Progress Notes (Signed)
[facility]  01/18/2018 8:10 AM Yvonne Crist Infante Befort  MRN:  [id number]  Subjective:   The patient remains calm on the unit but is reserved and withdrawn. He does not interact a lot with peers but is often out of his room in the day room. He denies any current active or passive suicidal thoughts. He denies any auditory or visual hallucinations. He still is mildly suspicious and paranoid about treatment. He slept over 6 hours last night. Appetite is good. Insight is limited and he still remains suspicious of the diagnosis of schizophrenia. He wants to go for a 2nd opinion. He denies any new somatic complaints. BP was elevated again this morning.   Past psychiatric history. Diagnosed with schizophrenia 2 years ago and hospitalized at [facility] adolescent unit. He then [facility]. According to the mother, he had a terrible reaction to Haldol or Abilify and had to spend 3 months in the hospital.He only responds to Clozapine but started refusing lab work. He was in the care of [facility] as long as he had Medicaid. Medicaid was reinstated now.  Family psychiatric history. None.  Social history. Dropped out of school due to mental illness but got GEDs. He lives with his miother and brother. He is unemployed.    Principal Problem: Schizophrenia, undifferentiated (HCC) Diagnosis:   Patient Active Problem List   Diagnosis Date Noted  . Schizophrenia, undifferentiated (HCC) [F20.3] 12/30/2017  . Noncompliance [Z91.19] 12/30/2017  . Aggressive behavior of adolescent [R46.89] 10/24/2015   Total Time spent with patient: 20 minutes  Past Psychiatric History: schizophrenia  Past Medical History:  Past Medical History:  Diagnosis Date  . Asthma   . Depression   . Psychosis [facility](HCC)     Past Surgical History:  Procedure Laterality Date  . BACK SURGERY     Family History: History reviewed. No pertinent family history. Family Psychiatric  History: none Social History:   Social History   Substance and Sexual Activity  Alcohol Use No     Social History   Substance and Sexual Activity  Drug Use Yes  . Types: Marijuana    Social History   Socioeconomic History  . Marital status: Single    Spouse name: Not on file  . Number of children: Not on file  . Years of education: Not on file  . Highest education level: Not on file  Occupational History  . Not on file  Social Needs  . Financial resource strain: Not on file  . Food insecurity:    Worry: Not on file    Inability: Not on file  . Transportation needs:    Medical: Not on file    Non-medical: Not on file  Tobacco Use  . Smoking status: Never Smoker  . Smokeless tobacco: Never Used  Substance and Sexual Activity  . Alcohol use: No  . Drug use: Yes    Types: Marijuana  . Sexual activity: Not Currently  Lifestyle  . Physical activity:    Days per week: Not on file    Minutes per session: Not on file  . Stress: Not on file  Relationships  . Social connections:    Talks on phone: Not on file    Gets together: Not on file    Attends religious service: Not on file    Active member of club or organization: Not on file    Attends meetings of clubs or organizations: Not on file    Relationship  status: Not on file  Other Topics Concern  . Not on file  Social History Narrative  . Not on file      Sleep: Good  Appetite:  Fair  Current Medications: Current Facility-Administered Medications  Medication Dose Route Frequency Provider Last Rate Last Dose  . acetaminophen (TYLENOL) tablet 650 mg  650 mg Oral Q6H PRN [name], [name], MD      . alum & mag hydroxide-simeth (MAALOX/MYLANTA) 200-200-20 MG/5ML suspension 30 mL  30 mL Oral Q4H PRN [name], [name], MD      . cloZAPine (CLOZARIL) tablet 300 mg  300 mg Oral QHS [name], [name], MD   300 mg at 01/17/18 2129  . hydrOXYzine (ATARAX/VISTARIL) tablet 50 mg  50 mg Oral TID PRN [name], [name], MD   50 mg at 01/15/18 2058  .  magnesium hydroxide (MILK OF MAGNESIA) suspension 30 mL  30 mL Oral Daily PRN [name], [name], MD   30 mL at 01/15/18 1308  . metoprolol tartrate (LOPRESSOR) tablet 12.5 mg  12.5 mg Oral BID [name], [name], MD   12.5 mg at 01/17/18 1650  . traZODone (DESYREL) tablet 100 mg  100 mg Oral QHS PRN [name], [name], MD   100 mg at 01/17/18 2129    Lab Results: No results found for this or any previous visit (from the past 48 hour(s)).  Blood Alcohol level:  Lab Results  Component Value Date   ETH <10 12/30/2017   ETH <5 09/29/2016    Metabolic Disorder Labs: Lab Results  Component Value Date   HGBA1C 4.6 (L) 12/30/2017   MPG 85.32 12/30/2017   No results found for: PROLACTIN Lab Results  Component Value Date   CHOL 185 12/30/2017   TRIG 125 12/30/2017   HDL 63 12/30/2017   CHOLHDL 2.9 12/30/2017   VLDL 25 12/30/2017   LDLCALC 97 12/30/2017     Musculoskeletal: Strength & Muscle Tone: within normal limits Gait & Station: normal Patient leans: N/A  Psychiatric Specialty Exam: Physical Exam  Nursing note and vitals reviewed. Psychiatric: His speech is normal. Judgment and thought content normal. His affect is blunt. He is slowed and withdrawn. Cognition and memory are normal.    Review of Systems  Constitutional: Negative.   HENT: Negative.   Eyes: Negative.   Respiratory: Negative.   Cardiovascular: Negative.   Gastrointestinal: Negative.   Genitourinary: Negative.   Skin: Negative.   Neurological: Negative.   Endo/Heme/Allergies: Negative.   Psychiatric/Behavioral: Positive for substance abuse.    Blood pressure (!) 124/97, pulse (!) 113, temperature (!) 97.5 F (36.4 C), temperature source Oral, resp. rate 18, height 6\' 2"  (1.88 m), weight 83.5 kg (184 lb), SpO2 100 %.Body mass index is 23.62 kg/m.  General Appearance: Casual  Eye Contact:  Good  Speech:  Clear and Coherent and Normal Rate  Volume:  Soft  Mood:  "I am fine"  Affect:  Blunt  Thought  Process:  Coherent, Goal Directed and Linear  Orientation:  Full (Time, Place, and Person)  Thought Content:  Logical; no paranoid thoughts but mildly suspicious  Suicidal Thoughts:  No  Homicidal Thoughts:  No  Memory:  Immediate;   Fair Recent;   Fair Remote;   Fair  Judgement: Fair - improving slowly  Insight:  Lacking  Psychomotor Activity:  Psychomotor Retardation  Concentration:  Concentration: Fair and Attention Span: Fair  Recall:  [employer] of Knowledge:  Fair  Language:  Fair  Akathisia:  No  Handed:  Right  AIMS (if indicated):     Assets:  Communication Skills Desire for Improvement Financial Resources/Insurance Physical Health Resilience Social Support  ADL's:  Intact  Cognition:  WNL  Sleep:  Number of Hours: 6.75     Treatment Plan Summary: Daily contact with patient to assess and evaluate symptoms and progress in treatment and Medication management   Mr. Hiemstra is a 22 year old male with a history of schizophrenia admitted for psychotic break in the context of treatment noncomplinace. He is still on Clozapine.    #Psychosis The patient is no longer actively  Psychotic He is now on Clozaril 300 mg nightly for psychosis and Zyprexa has been tapered down. We will continue trazodone 100 mg po nightly for insomnia EKG showed a QTC of 374  #Tachycardia and HTN, improved VSS stable He was started on Metoprolol12.5 mg BID He needs to followup with a PCP  #Labs -lipid panel, TSH and A1Care unremerkable -EKGreviewed, NSR with QTc 374  #Disposition Plan to discharge early this week -He will need to follow up with [facility] -the patient plans to return home to live with his mother and brother    Darliss Ridgel, MD 01/18/2018, 8:10 AM

## 2018-01-18 NOTE — Plan of Care (Signed)
Cooperative with treatment, very talkative with peers, medication compliant, appeared to rest well through out the night.

## 2018-01-18 NOTE — [facility] Group Notes (Signed)
LCSW Group Therapy Note 01/18/2018 1:15pm  Type of Therapy and Topic: Group Therapy: Feelings Around Returning Home & Establishing a Supportive Framework and Supporting Oneself When Supports Not Available  Participation Level: Active  Description of Group:  Patients first processed thoughts and feelings about upcoming discharge. These included fears of upcoming changes, lack of change, new living environments, judgements and expectations from others and overall stigma of mental health issues. The group then discussed the definition of a supportive framework, what that looks and feels like, and how do to discern it from an unhealthy non-supportive network. The group identified different types of supports as well as what to do when your family/friends are less than helpful or unavailable  Therapeutic Goals  1. Patient will identify one healthy supportive network that they can use at discharge. 2. Patient will identify one factor of a supportive framework and how to tell it from an unhealthy network. 3. Patient able to identify one coping skill to use when they do not have positive supports from others. 4. Patient will demonstrate ability to communicate their needs through discussion and/or role plays.  Summary of Patient Progress:  The patient reported he feels "nice." Pt engaged during group session. As patients processed their anxiety about discharge and described healthy supports patient shared "he has been here long enough and he is ready to go" Pt listed himself as his main support.  Patients identified at least one self-care tool they were willing to use after discharge; breathing exercises and mindfulness.   Therapeutic Modalities Cognitive Behavioral Therapy Motivational Interviewing   Leyana Whidden  CUEBAS-COLON, LCSW 01/18/2018 10:45 AM

## 2018-01-19 LAB — CLOZAPINE (CLOZARIL)
CLOZAPINE LVL: 391 ng/mL (ref 350–650)
NORCLOZAPINE: 175 ng/mL
TOTAL(CLOZ+ NORCLOZ): 566 ng/mL

## 2018-01-19 NOTE — Plan of Care (Addendum)
Patient found in common area upon my arrival. Patient is visible and social this evening. Mood and affect are improved. Patient affect is bright, animated, and appropriate. Patient is more verbal. Processing is improved. Compliance with unit routine is improved. Attends group. Reports eating and voiding adequately. Ate full snack. Denies SI/HI/AVH. Denies pain. Compliant with HS medications. Given Vistaril for restlessness with positive results. Q 15 minute checks maintained. Will continue to monitor throughout the shift. Patient slept 7.75 hours. No apparent distress. Will endorse care to oncoming shift.  Problem: Education: Goal: Knowledge of [place] [facility] Education information/materials will improve Outcome: Progressing Goal: Emotional status will improve Outcome: Progressing Goal: Mental status will improve Outcome: Progressing Goal: Verbalization of understanding the information provided will improve Outcome: Progressing   Problem: Coping: Goal: Ability to demonstrate self-control will improve Outcome: Progressing   Problem: Coping: Goal: Coping ability will improve Outcome: Progressing Goal: Will verbalize feelings Outcome: Progressing

## 2018-01-19 NOTE — Progress Notes (Signed)
Recreation Therapy Notes  Date: 01/19/2018      Time: 9:30 am  Location: Craft Room  Behavioral response: Appropriate, Restless  Intervention Topic: Coping Skills  Discussion/Intervention: Group content on today was focused on coping skills. The group defined what coping skills are and when they can be used. Individuals described how they normally cope with things and the coping skills they normally use. Patients expressed why it is important to cope with things and how not coping with things can affect you. The group participated in the intervention "My coping box" and made coping boxes while adding coping skills they could use in the future to the box. Clinical Observations/Feedback:  Patient came to group and walked in and out of group several times due to unknown reasons. Individual participated in the intervention while in group.   Tawsha Terrero LRT/CTRS         Riko Lumsden 01/19/2018 12:04 PM

## 2018-01-19 NOTE — Tx Team (Signed)
Interdisciplinary Treatment and Diagnostic Plan Update  01/19/2018 Time of Session: 10:30am [name] [name] MRN: [id number]  Principal Diagnosis: Schizophrenia, undifferentiated (HCC)  Secondary Diagnoses: Principal Problem:   Schizophrenia, undifferentiated (HCC) Active Problems:   Noncompliance   Current Medications:  Current Facility-Administered Medications  Medication Dose Route Frequency Provider Last Rate Last Dose  . acetaminophen (TYLENOL) tablet 650 mg  650 mg Oral Q6H PRN [name], [name], MD      . alum & mag hydroxide-simeth (MAALOX/MYLANTA) 200-200-20 MG/5ML suspension 30 mL  30 mL Oral Q4H PRN [name], [name], MD      . cloZAPine (CLOZARIL) tablet 300 mg  300 mg Oral QHS [name], [name], MD   300 mg at 01/18/18 2104  . hydrOXYzine (ATARAX/VISTARIL) tablet 50 mg  50 mg Oral TID PRN [name], [name], MD   50 mg at 01/18/18 2104  . magnesium hydroxide (MILK OF MAGNESIA) suspension 30 mL  30 mL Oral Daily PRN [name], [name], MD   30 mL at 01/15/18 1308  . metoprolol tartrate (LOPRESSOR) tablet 12.5 mg  12.5 mg Oral BID [name], [name], MD   12.5 mg at 01/19/18 0800  . traZODone (DESYREL) tablet 100 mg  100 mg Oral QHS PRN [name], [name], MD   100 mg at 01/18/18 2104   PTA Medications: Medications Prior to Admission  Medication Sig Dispense Refill Last Dose  . doxycycline (VIBRA-TABS) 100 MG tablet Take 1 tablet (100 mg total) by mouth 2 (two) times daily. (Patient not taking: Reported on 12/30/2017) 19 tablet 0 Not Taking at Unknown time  . HYDROcodone-acetaminophen (NORCO) 5-325 MG tablet Take 1 tablet by mouth every 6 (six) hours as needed. (Patient not taking: Reported on 12/30/2017) 10 tablet 0 Not Taking at Unknown time    Patient Stressors: Medication change or noncompliance  Patient Strengths: Capable of independent living Communication skills Physical Health Supportive family/friends  Treatment Modalities: Medication Management, Group therapy, Case  management,  1 to 1 session with clinician, Psychoeducation, Recreational therapy.   Physician Treatment Plan for Primary Diagnosis: Schizophrenia, undifferentiated (HCC) Long Term Goal(s): Improvement in symptoms so as ready for discharge NA   Short Term Goals: Ability to identify changes in lifestyle to reduce recurrence of condition will improve Ability to verbalize feelings will improve Ability to disclose and discuss suicidal ideas Ability to demonstrate self-control will improve Ability to identify and develop effective coping behaviors will improve Ability to maintain clinical measurements within normal limits will improve Compliance with prescribed medications will improve Ability to identify triggers associated with substance abuse/mental health issues will improve NA  Medication Management: Evaluate patient's response, side effects, and tolerance of medication regimen.  Therapeutic Interventions: 1 to 1 sessions, Unit Group sessions and Medication administration.  Evaluation of Outcomes: Progressing  Physician Treatment Plan for Secondary Diagnosis: Principal Problem:   Schizophrenia, undifferentiated (HCC) Active Problems:   Noncompliance  Long Term Goal(s): Improvement in symptoms so as ready for discharge NA   Short Term Goals: Ability to identify changes in lifestyle to reduce recurrence of condition will improve Ability to verbalize feelings will improve Ability to disclose and discuss suicidal ideas Ability to demonstrate self-control will improve Ability to identify and develop effective coping behaviors will improve Ability to maintain clinical measurements within normal limits will improve Compliance with prescribed medications will improve Ability to identify triggers associated with substance abuse/mental health issues will improve NA     Medication Management: Evaluate patient's response, side effects, and tolerance of medication  regimen.  Therapeutic  Interventions: 1 to 1 sessions, Unit Group sessions and Medication administration.  Evaluation of Outcomes: Progressing   RN Treatment Plan for Primary Diagnosis: Schizophrenia, undifferentiated (HCC) Long Term Goal(s): Knowledge of disease and therapeutic regimen to maintain health will improve  Short Term Goals: Ability to verbalize feelings will improve, Ability to identify and develop effective coping behaviors will improve and Compliance with prescribed medications will improve  Medication Management: RN will administer medications as ordered by provider, will assess and evaluate patient's response and provide education to patient for prescribed medication. RN will report any adverse and/or side effects to prescribing provider.  Therapeutic Interventions: 1 on 1 counseling sessions, Psychoeducation, Medication administration, Evaluate responses to treatment, Monitor vital signs and CBGs as ordered, Perform/monitor CIWA, COWS, AIMS and Fall Risk screenings as ordered, Perform wound care treatments as ordered.  Evaluation of Outcomes: Progressing   LCSW Treatment Plan for Primary Diagnosis: Schizophrenia, undifferentiated (HCC) Long Term Goal(s): Safe transition to appropriate next level of care at discharge, Engage patient in therapeutic group addressing interpersonal concerns.  Short Term Goals: Engage patient in aftercare planning with referrals and resources, Increase social support, Facilitate acceptance of mental health diagnosis and concerns, Identify triggers associated with mental health/substance abuse issues and Increase skills for wellness and recovery  Therapeutic Interventions: Assess for all discharge needs, 1 to 1 time with Social worker, Explore available resources and support systems, Assess for adequacy in community support network, Educate family and significant other(s) on suicide prevention, Complete Psychosocial Assessment, Interpersonal group therapy.  Evaluation of  Outcomes: Progressing   Progress in Treatment: Attending groups: Yes. Participating in groups: Yes. Taking medication as prescribed: Yes. Toleration medication: Yes. Family/Significant other contact made: No, will contact:  Patient refused Patient understands diagnosis: Yes. Discussing patient identified problems/goals with staff: Yes. Medical problems stabilized or resolved: Yes. Denies suicidal/homicidal ideation: Yes. Issues/concerns per patient self-inventory: No. Other:   New problem(s) identified: No, Describe:  None  New Short Term/Long Term Goal(s): *Patient did not attend.*  Patient Goals:  *Patient did not attend.*  Discharge Plan or Barriers: To return home and follow up with outpatient treatment.  Reason for Continuation of Hospitalization: Medication stabilization  Estimated Length of Stay: 3-5 days  Recreational Therapy: Patient Stressors: N/A  Patient Goal: Patient will engage in interactions with peers and staff in pro-social manner at least 2x within 5 recreation therapy group sessions  Attendees: Patient: 01/19/2018 11:41 AM  Physician: Dr. Jennet Maduro MD 01/19/2018 11:41 AM  Nursing:Phyllis Cobb, RN 01/19/2018 11:41 AM  RN Care Manager: 01/19/2018 11:41 AM  Social Worker: [name], LCSWA 01/19/2018 11:41 AM  Recreational Therapist:  01/19/2018 11:41 AM  Other: Heidi Dach, LCSW 01/19/2018 11:41 AM  Other: Jake Shark, LCSW 01/19/2018 11:41 AM  Other: Damian Leavell, Chaplin 01/19/2018 11:41 AM    Scribe for Treatment Team: [name], LCSW 01/19/2018 11:41 AM

## 2018-01-19 NOTE — Plan of Care (Signed)
Patient is alert and oriented, denies SI/HI/AVH and pain at this time. Patient paces the unit with select peer interaction noted. Patient observed smiling and interacting with peers and staff this shift. Denies having any self harm thoughts and affect is pleasant. Patient is compliant with medication and meals. Milieu remains safe with q 15 minute safety checks. Will continue to monitor.

## 2018-01-19 NOTE — Plan of Care (Addendum)
Patient found in common area upon my arrival. Patient is visible and social this evening. Patient mood and affect continues to improve. Patient is more appropriate, processing is improving. Affect is animated and less intense. Denies SI/HI/AVH. Reports eating and voiding adequately. Patient is restless and pacing, given Vistaril. Given Trazodone for sleep. Both with positive results. Compliant with HS medications and staff direction. Attends group. Q 15 minute checks maintained. Will continue to monitor throughout the shift. Patient slept 7.75 hours. No apparent distress. Will endorse care to oncoming shift.  Problem: Education: Goal: Emotional status will improve Outcome: Progressing Goal: Mental status will improve Outcome: Progressing Goal: Verbalization of understanding the information provided will improve Outcome: Progressing   Problem: Coping: Goal: Ability to verbalize frustrations and anger appropriately will improve Outcome: Progressing Goal: Ability to demonstrate self-control will improve Outcome: Progressing   Problem: Coping: Goal: Coping ability will improve Outcome: Progressing

## 2018-01-19 NOTE — Progress Notes (Signed)
[facility] MD Progress Note  01/19/2018 4:09 PM [place] SHAMEL GERMOND  MRN:  [id number]  Subjective:    Mr. Bedoya denies any symptoms of depression, anxiety or psychosis and demands discharge. Apparently, he alowed his mother to call him this weekend but he admits that the conversation lasted few minutes only because he "does not have to listen to her". He agrees to be discharged to home with family rather that to the shelter. He however changed his miind several times during our conversation. He is in general dismissive of females and rather disrespectful of his mother. He has no insight into his pr4oblesm and is nonreasuring. Need family meeting before discharge.   Principal Problem: Schizophrenia, undifferentiated (HCC) Diagnosis:   Patient Active Problem List   Diagnosis Date Noted  . Schizophrenia, undifferentiated (HCC) [F20.3] 12/30/2017    Priority: High  . Noncompliance [Z91.19] 12/30/2017  . Aggressive behavior of adolescent [R46.89] 10/24/2015   Total Time spent with patient: 20 minutes  Past Psychiatric History: schizophrenia  Past Medical History:  Past Medical History:  Diagnosis Date  . Asthma   . Depression   . Psychosis [facility])     Past Surgical History:  Procedure Laterality Date  . BACK SURGERY     Family History: History reviewed. No pertinent family history. Family Psychiatric  History: none Social History:  Social History   Substance and Sexual Activity  Alcohol Use No     Social History   Substance and Sexual Activity  Drug Use Yes  . Types: Marijuana    Social History   Socioeconomic History  . Marital status: Single    Spouse name: Not on file  . Number of children: Not on file  . Years of education: Not on file  . Highest education level: Not on file  Occupational History  . Not on file  Social Needs  . Financial resource strain: Not on file  . Food insecurity:    Worry: Not on file    Inability: Not on file  . Transportation needs:   Medical: Not on file    Non-medical: Not on file  Tobacco Use  . Smoking status: Never Smoker  . Smokeless tobacco: Never Used  Substance and Sexual Activity  . Alcohol use: No  . Drug use: Yes    Types: Marijuana  . Sexual activity: Not Currently  Lifestyle  . Physical activity:    Days per week: Not on file    Minutes per session: Not on file  . Stress: Not on file  Relationships  . Social connections:    Talks on phone: Not on file    Gets together: Not on file    Attends religious service: Not on file    Active member of club or organization: Not on file    Attends meetings of clubs or organizations: Not on file    Relationship status: Not on file  Other Topics Concern  . Not on file  Social History Narrative  . Not on file   Additional Social History:                         Sleep: Fair  Appetite:  Fair  Current Medications: Current Facility-Administered Medications  Medication Dose Route Frequency Provider Last Rate Last Dose  . acetaminophen (TYLENOL) tablet 650 mg  650 mg Oral Q6H PRN [name], [name], MD      . alum & mag hydroxide-simeth (MAALOX/MYLANTA) 200-200-20 MG/5ML suspension 30 mL  30 mL Oral Q4H PRN [name], [name], MD      . cloZAPine (CLOZARIL) tablet 300 mg  300 mg Oral QHS [name], [name], MD   300 mg at 01/18/18 2104  . hydrOXYzine (ATARAX/VISTARIL) tablet 50 mg  50 mg Oral TID PRN [name], [name], MD   50 mg at 01/18/18 2104  . magnesium hydroxide (MILK OF MAGNESIA) suspension 30 mL  30 mL Oral Daily PRN [name], [name], MD   30 mL at 01/15/18 1308  . metoprolol tartrate (LOPRESSOR) tablet 12.5 mg  12.5 mg Oral BID [name], [name], MD   12.5 mg at 01/19/18 0800  . traZODone (DESYREL) tablet 100 mg  100 mg Oral QHS PRN [name], [name], MD   100 mg at 01/18/18 2104    Lab Results: No results found for this or any previous visit (from the past 48 hour(s)).  Blood Alcohol level:  Lab Results  Component Value Date   ETH <10  12/30/2017   ETH <5 09/29/2016    Metabolic Disorder Labs: Lab Results  Component Value Date   HGBA1C 4.6 (L) 12/30/2017   MPG 85.32 12/30/2017   No results found for: PROLACTIN Lab Results  Component Value Date   CHOL 185 12/30/2017   TRIG 125 12/30/2017   HDL 63 12/30/2017   CHOLHDL 2.9 12/30/2017   VLDL 25 12/30/2017   LDLCALC 97 12/30/2017    Physical Findings: AIMS:  , ,  ,  ,    CIWA:    COWS:     Musculoskeletal: Strength & Muscle Tone: within normal limits Gait & Station: normal Patient leans: N/A  Psychiatric Specialty Exam: Physical Exam  Nursing note and vitals reviewed. Psychiatric: His speech is normal and behavior is normal. Judgment and thought content normal. His affect is inappropriate. Cognition and memory are normal.    Review of Systems  Neurological: Negative.   Psychiatric/Behavioral: Negative.   All other systems reviewed and are negative.   Blood pressure (!) 143/103, pulse (!) 107, temperature 97.6 F (36.4 C), temperature source Oral, resp. rate 18, height 6\' 2"  (1.88 m), weight 83.5 kg (184 lb), SpO2 100 %.Body mass index is 23.62 kg/m.  General Appearance: Casual  Eye Contact:  Good  Speech:  Clear and Coherent  Volume:  Normal  Mood:  Irritable  Affect:  Inappropriate  Thought Process:  Disorganized and Descriptions of Associations: Tangential  Orientation:  Full (Time, Place, and Person)  Thought Content:  Delusions and Paranoid Ideation  Suicidal Thoughts:  No  Homicidal Thoughts:  No  Memory:  Immediate;   Fair Recent;   Fair Remote;   Fair  Judgement:  Poor  Insight:  Lacking  Psychomotor Activity:  Decreased  Concentration:  Concentration: Fair and Attention Span: Fair  Recall:  [employer]Fair  Fund of Knowledge:  Fair  Language:  Fair  Akathisia:  No  Handed:  Right  AIMS (if indicated):     Assets:  Communication Skills Desire for Improvement Financial Resources/Insurance Housing Physical Health Resilience Social  Support  ADL's:  Intact  Cognition:  WNL  Sleep:  Number of Hours: 7.75     Treatment Plan Summary: Daily contact with patient to assess and evaluate symptoms and progress in treatment and Medication management   Mr. Hulan AmatoMustafa is a 22 year old male with a history of schizophrenia admitted for psychotic break in the context of treatment noncompliance.   #Psychosis, improved -continue Clozaril 300 mg nightly for psychosis  -trazodone 100 mg po  nightly for insomnia  #Tachycardia and HTN,improved VSS stable -continue Metoprolol12.5 mg BID  #Labs -lipid panel, TSH and A1Care unremerkable -EKGreviewed, NSR with QTc 374  #Disposition -to home with his mother -follow up with [facility]   Kristine Linea, MD 01/19/2018, 4:09 PM

## 2018-01-19 NOTE — BHH Counselor (Signed)
CSW attempted to call the patients mother [name] [name], [phone] to discuss discharge plans and follow up. CSW left a message with a family member to call back CSW. Family member reports that [name] doesn't get home until 9pm but can call tomorrow morning.  [name], MSW, Theresia MajorsLCSWA, Bridget HartshornLCASA Clinical Social Worker 01/19/2018 1:02 PM

## 2018-01-20 MED ORDER — METFORMIN HCL 500 MG PO TABS
500.0000 mg | ORAL_TABLET | Freq: Every day | ORAL | Status: DC
  Administered 2018-01-21 – 2018-01-29 (×8): 500 mg via ORAL
  Filled 2018-01-20 (×8): qty 1

## 2018-01-20 MED ORDER — METOPROLOL TARTRATE 25 MG PO TABS: 12.5000 mg | ORAL_TABLET | Freq: Two times a day (BID) | ORAL | 1 refills | Status: DC

## 2018-01-20 MED ORDER — TRAZODONE HCL 100 MG PO TABS: 100.0000 mg | ORAL_TABLET | Freq: Every evening | ORAL | 1 refills | Status: DC | PRN

## 2018-01-20 MED ORDER — CLOZAPINE 100 MG PO TABS: 300.0000 mg | ORAL_TABLET | Freq: Every day | ORAL | Status: DC

## 2018-01-20 MED ORDER — METFORMIN HCL 500 MG PO TABS: 500.0000 mg | ORAL_TABLET | Freq: Every day | ORAL | 1 refills | Status: DC

## 2018-01-20 NOTE — BHH Group Notes (Signed)
01/20/2018 1PM  Type of Therapy/Topic:  Group Therapy:  Feelings about Diagnosis  Participation Level:  Minimal   Description of Group:   This group will allow patients to explore their thoughts and feelings about diagnoses they have received. Patients will be guided to explore their level of understanding and acceptance of these diagnoses. Facilitator will encourage patients to process their thoughts and feelings about the reactions of others to their diagnosis and will guide patients in identifying ways to discuss their diagnosis with significant others in their lives. This group will be process-oriented, with patients participating in exploration of their own experiences, giving and receiving support, and processing challenge from other group members.   Therapeutic Goals: 1. Patient will demonstrate understanding of diagnosis as evidenced by identifying two or more symptoms of the disorder 2. Patient will be able to express two feelings regarding the diagnosis 3. Patient will demonstrate their ability to communicate their needs through discussion and/or role play  Summary of Patient Progress: .Patient practiced active listening when interacting with the facilitator and other group members. Patient left before group was over. When asked about his symptoms in relation to his diagnosis he reports "I dont have any". When asked about his feelings regarding his diagnosis he reports "I finally figured out what's wrong." Patient is still in the process of obtaining treatment goals.        Therapeutic Modalities:   Cognitive Behavioral Therapy Brief Therapy Feelings Identification    Johny ShearsCassandra  Crispin Vogel, Alexander MtLCSW 01/20/2018 3:18 PM

## 2018-01-20 NOTE — Progress Notes (Signed)
[facility]  01/20/2018 3:07 PM Hisham Crist Infante Hileman  MRN:  [id number]  Subjective:    [name] denies any symptoms of mental illness. He accepts medications and reports no side effects. He is determined to leave the hospital before the holidays. This is not impossible as he is not overtly psychotic, suicidal or homicidal. It is paramount, that we talk/meet with his mother prior to discharge. She needs more education about her son's condition. She has rather unrealistic expectations.  In spite of multiple attempts, we hwve not been able to contact her directly. SW left messages with family and on her cell phone.   Principal Problem: Schizophrenia, undifferentiated (HCC) Diagnosis:   Patient Active Problem List   Diagnosis Date Noted  . Schizophrenia, undifferentiated (HCC) [F20.3] 12/30/2017    Priority: High  . Noncompliance [Z91.19] 12/30/2017  . Aggressive behavior of adolescent [R46.89] 10/24/2015   Total Time spent with patient: 20 minutes  Past Psychiatric History: schizophrenia  Past Medical History:  Past Medical History:  Diagnosis Date  . Asthma   . Depression   . Psychosis [facility](HCC)     Past Surgical History:  Procedure Laterality Date  . BACK SURGERY     Family History: History reviewed. No pertinent family history. Family Psychiatric  History: none Social History:  Social History   Substance and Sexual Activity  Alcohol Use No     Social History   Substance and Sexual Activity  Drug Use Yes  . Types: Marijuana    Social History   Socioeconomic History  . Marital status: Single    Spouse name: Not on file  . Number of children: Not on file  . Years of education: Not on file  . Highest education level: Not on file  Occupational History  . Not on file  Social Needs  . Financial resource strain: Not on file  . Food insecurity:    Worry: Not on file    Inability: Not on file  . Transportation needs:    Medical: Not on file    Non-medical:  Not on file  Tobacco Use  . Smoking status: Never Smoker  . Smokeless tobacco: Never Used  Substance and Sexual Activity  . Alcohol use: No  . Drug use: Yes    Types: Marijuana  . Sexual activity: Not Currently  Lifestyle  . Physical activity:    Days per week: Not on file    Minutes per session: Not on file  . Stress: Not on file  Relationships  . Social connections:    Talks on phone: Not on file    Gets together: Not on file    Attends religious service: Not on file    Active member of club or organization: Not on file    Attends meetings of clubs or organizations: Not on file    Relationship status: Not on file  Other Topics Concern  . Not on file  Social History Narrative  . Not on file   Additional Social History:                         Sleep: Fair  Appetite:  Fair  Current Medications: Current Facility-Administered Medications  Medication Dose Route Frequency Provider Last Rate Last Dose  . acetaminophen (TYLENOL) tablet 650 mg  650 mg Oral Q6H PRN [name], [name], MD      . alum & mag hydroxide-simeth (MAALOX/MYLANTA) 200-200-20 MG/5ML suspension 30 mL  30 mL Oral  Q4H PRN [name], [name], MD      . cloZAPine (CLOZARIL) tablet 300 mg  300 mg Oral QHS Day Greb B, MD   300 mg at 01/19/18 2044  . hydrOXYzine (ATARAX/VISTARIL) tablet 50 mg  50 mg Oral TID PRN [name], [name], MD   50 mg at 01/19/18 2116  . magnesium hydroxide (MILK OF MAGNESIA) suspension 30 mL  30 mL Oral Daily PRN [name], [name], MD   30 mL at 01/15/18 1308  . [START ON 01/21/2018] metFORMIN (GLUCOPHAGE) tablet 500 mg  500 mg Oral Q breakfast Deandrea Vanpelt B, MD      . metoprolol tartrate (LOPRESSOR) tablet 12.5 mg  12.5 mg Oral BID Oberia Beaudoin B, MD   12.5 mg at 01/20/18 0820  . traZODone (DESYREL) tablet 100 mg  100 mg Oral QHS PRN [name], [name], MD   100 mg at 01/19/18 2116    Lab Results: No results found for this or any previous visit (from the past 48  hour(s)).  Blood Alcohol level:  Lab Results  Component Value Date   ETH <10 12/30/2017   ETH <5 09/29/2016    Metabolic Disorder Labs: Lab Results  Component Value Date   HGBA1C 4.6 (L) 12/30/2017   MPG 85.32 12/30/2017   No results found for: PROLACTIN Lab Results  Component Value Date   CHOL 185 12/30/2017   TRIG 125 12/30/2017   HDL 63 12/30/2017   CHOLHDL 2.9 12/30/2017   VLDL 25 12/30/2017   LDLCALC 97 12/30/2017    Physical Findings: AIMS:  , ,  ,  ,    CIWA:    COWS:     Musculoskeletal: Strength & Muscle Tone: within normal limits Gait & Station: normal Patient leans: N/A  Psychiatric Specialty Exam: Physical Exam  Nursing note and vitals reviewed. Psychiatric: His speech is normal. Judgment normal. His affect is blunt. He is withdrawn. Thought content is delusional. Cognition and memory are normal.    Review of Systems  Neurological: Negative.   Psychiatric/Behavioral: Negative.   All other systems reviewed and are negative.   Blood pressure 114/70, pulse 95, temperature 98.4 F (36.9 C), resp. rate 16, height 6\' 2"  (1.88 m), weight 83.5 kg (184 lb), SpO2 100 %.Body mass index is 23.62 kg/m.  General Appearance: Casual  Eye Contact:  Good  Speech:  Clear and Coherent  Volume:  Normal  Mood:  Anxious  Affect:  Blunt  Thought Process:  Goal Directed, Linear and Descriptions of Associations: Tangential  Orientation:  Full (Time, Place, and Person)  Thought Content:  WDL  Suicidal Thoughts:  No  Homicidal Thoughts:  No  Memory:  Immediate;   Fair Recent;   Fair Remote;   Fair  Judgement:  Poor  Insight:  Lacking  Psychomotor Activity:  Increased  Concentration:  Concentration: Fair and Attention Span: Fair  Recall:  [employer] of Knowledge:  Fair  Language:  Fair  Akathisia:  No  Handed:  Right  AIMS (if indicated):     Assets:  Communication Skills Desire for Improvement Financial Resources/Insurance Housing Physical  Health Resilience Social Support  ADL's:  Intact  Cognition:  WNL  Sleep:  Number of Hours: 7.75     Treatment Plan Summary: Daily contact with patient to assess and evaluate symptoms and progress in treatment and Medication management   Mr. Mimbs is a 22 year old male with a history of schizophrenia admitted for psychotic break in the context of treatment noncompliance.He has  been compliant with treatment while in the hospital and seems to tolerate Clozapine well. His ANC# however, is low.   #Psychosis, improved -continue Clozaril 300 mg nightly for psychosis  -trazodone 100 mg po nightly for insomnia  #Tachycardia and HTN,improved VSS stable -continue Metoprolol12.5 mg BID  #Metabilic syndrome prevention -start Metformin 500 mg daily  #Labs -lipid panel, TSH and A1Care unremerkable -EKGreviewed, NSR with QTc 374  #Disposition -to home with his mother tomorrow -follow up with [facility] -petition for 90 days of involuntary outpatient psychiatric commitment to [facility] was send to court    Kristine Linea, MD 01/20/2018, 3:07 PM

## 2018-01-20 NOTE — B[facility] Suicide Risk Assessment (Addendum)
[facility]   Principal Problem: Schizoaffective disorder, bipolar type [facility](HCC) Discharge Diagnoses:  Patient Active Problem List   Diagnosis Date Noted  . Schizoaffective disorder, bipolar type (HCC) [F25.0] 12/30/2017    Priority: High  . Noncompliance [Z91.19] 12/30/2017  . Aggressive behavior of adolescent [R46.89] 10/24/2015    Total Time spent with patient: 20 minutes  Musculoskeletal: Strength & Muscle Tone: within normal limits Gait & Station: normal Patient leans: N/A  Psychiatric Specialty Exam: Review of Systems  Neurological: Negative.   Psychiatric/Behavioral: Negative.   All other systems reviewed and are negative.   Blood pressure (!) 122/99, pulse 94, temperature 98.1 F (36.7 C), temperature source Oral, resp. rate 18, height 6\' 2"  (1.88 m), weight 83.5 kg (184 lb), SpO2 100 %.Body mass index is 23.62 kg/m.  General Appearance: Casual  Eye Contact::  Good  Speech:  Clear and Coherent409  Volume:  Normal  Mood:  Euthymic  Affect:  Flat  Thought Process:  Goal Directed and Descriptions of Associations: Intact  Orientation:  Full (Time, Place, and Person)  Thought Content:  WDL  Suicidal Thoughts:  No  Homicidal Thoughts:  No  Memory:  Immediate;   Fair Recent;   Fair Remote;   Fair  Judgement:  Poor  Insight:  Lacking  Psychomotor Activity:  Normal  Concentration:  Fair  Recall:  [employer]Fair  Fund of Knowledge:Fair  Language: Fair  Akathisia:  No  Handed:  Right  AIMS (if indicated):     Assets:  Communication Skills Desire for Improvement Financial Resources/Insurance Housing Physical Health Resilience Social Support  Sleep:  Number of Hours: 7  Cognition: WNL  ADL's:  Intact   Mental Status Per Nursing Assessment::   On Admission:  NA  Demographic Factors:  Male, Adolescent or young adult, Low socioeconomic status and Unemployed  Loss Factors: NA  Historical Factors: Impulsivity  Risk Reduction Factors:    Sense of responsibility to family, Living with another person, especially a relative and Positive social support  Continued Clinical Symptoms:  Schizophrenia:   Less than 8948 years old Paranoid or undifferentiated type  Cognitive Features That Contribute To Risk:  None    Suicide Risk:  Minimal: No identifiable suicidal ideation.  Patients presenting with no risk factors but with morbid ruminations; may be classified as minimal risk based on the severity of the depressive symptoms  Follow-up Information    [facility] Go on 01/23/2018.   Why:  Please [facility] on Friday January 23, 2018. He will be picking you up at 7am. His number is [phone]. Thank you. Contact information: [address] [place] [address] [phone]           Plan Of Care/Follow-up recommendations:  Activity:  as tolerated Diet:  regular Other:  keep follow up appointment  Kristine LineaJolanta Izabella Marcantel, MD 01/29/2018, 8:13 AM

## 2018-01-20 NOTE — Progress Notes (Signed)
Recreation Therapy Notes  Date: 01/20/2018  Time: 9:30 am   Location: Craft room  Behavioral response: N/A   Intervention Topic: Creative Expressions  Discussion/Intervention: Patient did not attend group.   Clinical Observations/Feedback:  Patient did not attend group.   Ricahrd Schwager LRT/CTRS        Vanesha Athens 01/20/2018 11:10 AM

## 2018-01-20 NOTE — BHH Group Notes (Signed)
CSW Group Therapy Note  01/20/2018  Time:  0900  Type of Therapy and Topic: Group Therapy: Goals Group: SMART Goals    Participation Level:  Did Not Attend    Description of Group:   The purpose of a daily goals group is to assist and guide patients in setting recovery/wellness-related goals. The objective is to set goals as they relate to the crisis in which they were admitted. Patients will be using SMART goal modalities to set measurable goals. Characteristics of realistic goals will be discussed and patients will be assisted in setting and processing how one will reach their goal. Facilitator will also assist patients in applying interventions and coping skills learned in psycho-education groups to the SMART goal and process how one will achieve defined goal.    Therapeutic Goals:  -Patients will develop and document one goal related to or their crisis in which brought them into treatment.  -Patients will be guided by LCSW using SMART goal setting modality in how to set a measurable, attainable, realistic and time sensitive goal.  -Patients will process barriers in reaching goal.  -Patients will process interventions in how to overcome and successful in reaching goal.    Patient's Goal:  Pt was invited to attend group but chose not to attend. CSW will continue to encourage pt to attend group throughout their admission.   Therapeutic Modalities:  Motivational Interviewing  Cognitive Behavioral Therapy  Crisis Intervention Model  SMART goals setting  Shaleta Ruacho, MSW, LCSW Clinical Social Worker 01/20/2018 9:48 AM   

## 2018-01-20 NOTE — BHH Group Notes (Signed)
BHH Group Notes:  (Nursing/MHT/Case Management/Adjunct)  Date:  01/20/2018  Time:  9:20 PM  Type of Therapy:  Group Therapy  Participation Level:  Active  Participation Quality:  Appropriate  Affect:  Appropriate  Cognitive:  Appropriate  Insight:  Appropriate  Engagement in Group:  Engaged  Modes of Intervention:  Discussion  Summary of Progress/Problems:  [name] [name] 01/20/2018, 9:20 PM 

## 2018-01-20 NOTE — Discharge Summary (Addendum)
Physician Discharge Summary Note  Patient:  [name] [name] is an 22 y.o., male MRN:  [id number] DOB:  [birth year] Patient phone:  [phone] (home)  Patient address:   [address] [place] [address],  Total Time spent with patient: 20 minutes plus 15 min on care coordination and documentation  Date of Admission:  12/30/2017 Date of Discharge: 01/29/2018  Reason for Admission:  Psychotic break.  History of Present Illness:   Identifying data. [name] [name] is a 22 year old male with schizophrenia.  Chief complaint. "I don't want to take medications and want to leave."  History of present illness. Information was obtained from the patient and the chart. The patient was brought to the ER by the police after several episodes of strange and out of character behaviors. After he stopped taking medications in December of 2018 because "he did not need them" he became increasingly psychotic and disorganized. He started hallucinating, talking to his voices laughing inappropriately. He became aggressive towards his family pushing his mother. He was loitering at the book store and when asked to leave, he broke into a car at tha parking lot and pretended he were driving. He was charged with property damage. Crisis team was called to the house several times until they decided he needed to come to the hospital. The patient himself denies any problems, insists on leaving immediately and moving in with a friend in [place]. He refused medications and EKG today. He answers my questions reluctantly but worms up as the interview progresses. He however is vague. He believes that "problems at home" brought him here but nothing is wrong with him. He needs no medications. He denies substance abuse and was negative for substances on admission.  Past psychiatric history. Diagnosed with schizophrenia 2 years ago and hospitalized at [facility] on adolescent unit. He then followed up with [facility]. According  to the mother, he had a terrible reaction to Haldol or Abilify and had to spend 3 months in the hospital.He only responds to Clozapine but started refusing lab work. He was in the care of [facility] ACT team as long as he had Medicaid. Medicaid was reinstated now.  Family psychiatric history. None.  Social history. Dropped out of school due to mental illness but got GEDs and went to community college some. He lives with his miother and brother.  Principal Problem: Schizoaffective disorder, bipolar type [facility]) Discharge Diagnoses: Patient Active Problem List   Diagnosis Date Noted  . Schizoaffective disorder, bipolar type (HCC) [F25.0] 12/30/2017    Priority: High  . Noncompliance [Z91.19] 12/30/2017  . Aggressive behavior of adolescent [R46.89] 10/24/2015   Past Medical History:  Past Medical History:  Diagnosis Date  . Asthma   . Depression   . Psychosis [facility])     Past Surgical History:  Procedure Laterality Date  . BACK SURGERY     Family History: History reviewed. No pertinent family history.  Social History:  Social History   Substance and Sexual Activity  Alcohol Use No     Social History   Substance and Sexual Activity  Drug Use Yes  . Types: Marijuana    Social History   Socioeconomic History  . Marital status: Single    Spouse name: Not on file  . Number of children: Not on file  . Years of education: Not on file  . Highest education level: Not on file  Occupational History  . Not on file  Social Needs  . Financial resource strain: Not  on file  . Food insecurity:    Worry: Not on file    Inability: Not on file  . Transportation needs:    Medical: Not on file    Non-medical: Not on file  Tobacco Use  . Smoking status: Never Smoker  . Smokeless tobacco: Never Used  Substance and Sexual Activity  . Alcohol use: No  . Drug use: Yes    Types: Marijuana  . Sexual activity: Not Currently  Lifestyle  . Physical activity:    Days per week: Not on file     Minutes per session: Not on file  . Stress: Not on file  Relationships  . Social connections:    Talks on phone: Not on file    Gets together: Not on file    Attends religious service: Not on file    Active member of club or organization: Not on file    Attends meetings of clubs or organizations: Not on file    Relationship status: Not on file  Other Topics Concern  . Not on file  Social History Narrative  . Not on file    Hospital Course:    [name] [name] is a 22 year old male with a history of schizophrenia admitted for psychotic break in the context of treatmentnoncompliance.He has been compliant with treatment while in the hospital. We were able to titrate his dose to 300 mg nightly. He tolerated it well.He however started displaying unstable mood with irritable and threatening behaviorthat resolved with Depakote.  #Mood/Psychosis,improved -continueClozaril 300 mg nightly for psychosis, Clo+NClo level633 on 7/3 -continue Depakote 500 mg BID, on 1500 mg VPA level was 109 -trazodone 100 mg po nightly for insomniaPRN -Vistaril50 mg PRN  #Tachycardia and HTN,VS are stable -continueMetoprolol12.5 mg BID  #Metabolic syndrome prevention -continue Metformin 500 mg daily  #Labs -lipid panel, TSH and A1Care unremerkable -EKGreviewed, NSR with QTc 374  #Social -family meeting completed -mother prefers [facility] over [facility] peer supportbut [facility] refused to accept the patient as they believe that both, the patient and his mother, have limited insight into his problems and havenot been able to accept consequences ofdiagnosis of schizophrenia -theyalso refuse to apply for disabilityor guardianship -wepetitioned courtfor 90 days of involuntary outpatient psychiatric commitment to [facility]upon discharge  #Disposition -discharge with family -follow upwith [facility]      Physical Findings: AIMS:  , ,  ,  ,    CIWA:    COWS:     Musculoskeletal: Strength &  Muscle Tone: within normal limits Gait & Station: normal Patient leans: N/A  Psychiatric Specialty Exam: Physical Exam  Nursing note and vitals reviewed. Psychiatric: His speech is normal and behavior is normal. Judgment and thought content normal. His affect is blunt. Cognition and memory are normal.    Review of Systems  Neurological: Negative.   Psychiatric/Behavioral: Negative.   All other systems reviewed and are negative.   Blood pressure (!) 122/99, pulse 94, temperature 98.1 F (36.7 C), temperature source Oral, resp. rate 18, height 6\' 2"  (1.88 m), weight 83.5 kg (184 lb), SpO2 100 %.Body mass index is 23.62 kg/m.  General Appearance: Casual  Eye Contact:  Good  Speech:  Clear and Coherent  Volume:  Normal  Mood:  Euthymic  Affect:  Blunt  Thought Process:  Goal Directed and Descriptions of Associations: Intact  Orientation:  Full (Time, Place, and Person)  Thought Content:  WDL  Suicidal Thoughts:  No  Homicidal Thoughts:  No  Memory:  Immediate;   Fair  Recent;   Fair Remote;   Fair  Judgement:  Poor  Insight:  Lacking  Psychomotor Activity:  Normal  Concentration:  Concentration: Fair and Attention Span: Fair  Recall:  [employer]Fair  Fund of Knowledge:  Fair  Language:  Fair  Akathisia:  No  Handed:  Right  AIMS (if indicated):     Assets:  Communication Skills Desire for Improvement Financial Resources/Insurance Housing Physical Health Resilience Social Support  ADL's:  Intact  Cognition:  WNL  Sleep:  Number of Hours: 7     Have you used any form of tobacco in the last 30 days? (Cigarettes, Smokeless Tobacco, Cigars, and/or Pipes): No  Has this patient used any form of tobacco in the last 30 days? (Cigarettes, Smokeless Tobacco, Cigars, and/or Pipes) Yes, No  Blood Alcohol level:  Lab Results  Component Value Date   ETH <10 12/30/2017   ETH <5 09/29/2016    Metabolic Disorder Labs:  Lab Results  Component Value Date   HGBA1C 4.6 (L) 12/30/2017    MPG 85.32 12/30/2017   No results found for: PROLACTIN Lab Results  Component Value Date   CHOL 185 12/30/2017   TRIG 125 12/30/2017   HDL 63 12/30/2017   CHOLHDL 2.9 12/30/2017   VLDL 25 12/30/2017   LDLCALC 97 12/30/2017    See Psychiatric Specialty Exam and Suicide Risk Assessment completed by Attending Physician prior to discharge.  Discharge destination:  Home  Is patient on multiple antipsychotic therapies at discharge:  No   Has Patient had three or more failed trials of antipsychotic monotherapy by history:  No  Recommended Plan for Multiple Antipsychotic Therapies: NA  Discharge Instructions    Diet - low sodium heart healthy   Complete by:  As directed    Diet - low sodium heart healthy   Complete by:  As directed    Increase activity slowly   Complete by:  As directed    Increase activity slowly   Complete by:  As directed      Allergies as of 01/29/2018   No Known Allergies     Medication List    STOP taking these medications   doxycycline 100 MG tablet Commonly known as:  VIBRA-TABS   HYDROcodone-acetaminophen 5-325 MG tablet Commonly known as:  NORCO     TAKE these medications     Indication  cloZAPine 100 MG tablet Commonly known as:  CLOZARIL Take 3 tablets (300 mg total) by mouth at bedtime.  Indication:  Schizophrenia that does Not Respond to Usual Drug Therapy   divalproex 500 MG DR tablet Commonly known as:  DEPAKOTE Take 1 tablet (500 mg total) by mouth every 12 (twelve) hours.  Indication:  Manic Phase of Manic-Depression   hydrOXYzine 50 MG tablet Commonly known as:  ATARAX/VISTARIL Take 1 tablet (50 mg total) by mouth 3 (three) times daily as needed for anxiety.  Indication:  Feeling Anxious   metFORMIN 500 MG tablet Commonly known as:  GLUCOPHAGE Take 1 tablet (500 mg total) by mouth daily with breakfast.  Indication:  Antipsychotic Therapy-Induced Weight Gain   metoprolol tartrate 25 MG tablet Commonly known as:   LOPRESSOR Take 0.5 tablets (12.5 mg total) by mouth 2 (two) times daily.  Indication:  Supraventricular Tachycardia   traZODone 100 MG tablet Commonly known as:  DESYREL Take 1 tablet (100 mg total) by mouth at bedtime as needed for sleep.  Indication:  Trouble Sleeping      Follow-up Information    [facility] on 01/23/2018.   Why:  Please follow up with Unk Pinto at [facility] for peer support services on Friday January 23, 2018. He will be picking you up at 7am. His number is [phone]. Thank you. Contact information: [address] [place] [address] [phone]           Follow-up recommendations:  Activity:  as tolerated Diet:  regular Other:  keep follow up appointment  Comments:    Signed: Kristine Linea, MD 01/29/2018, 8:21 AM

## 2018-01-20 NOTE — Plan of Care (Signed)
Patient seems to be digressing in regards to his emotional status. In comparison to yesterday, patient is not smiling and interacting with his peers. Patient is pacing the unit and his affect does not brighten even when spoken to. Compliant with his medications and eats his meals. Did not attend morning group today. Patient had to be spoken to by this Clinical research associatewriter and informed him that it was inappropriate for him to ask a male client for her phone number. Milieu remains quiet and safe with q 15 minute checks.

## 2018-01-21 LAB — CBC WITH DIFFERENTIAL/PLATELET
BASOS ABS: 0 10*3/uL (ref 0–0.1)
BASOS PCT: 0 %
EOS ABS: 0 10*3/uL (ref 0–0.7)
Eosinophils Relative: 0 %
HCT: 47.7 % (ref 40.0–52.0)
HEMOGLOBIN: 16.5 g/dL (ref 13.0–18.0)
Lymphocytes Relative: 44 %
Lymphs Abs: 2.4 10*3/uL (ref 1.0–3.6)
MCH: 28.6 pg (ref 26.0–34.0)
MCHC: 34.6 g/dL (ref 32.0–36.0)
MCV: 82.6 fL (ref 80.0–100.0)
Monocytes Absolute: 0.6 10*3/uL (ref 0.2–1.0)
Monocytes Relative: 11 %
NEUTROS ABS: 2.4 10*3/uL (ref 1.4–6.5)
Neutrophils Relative %: 45 %
Platelets: 276 10*3/uL (ref 150–440)
RBC: 5.77 MIL/uL (ref 4.40–5.90)
RDW: 13.4 % (ref 11.5–14.5)
WBC: 5.5 10*3/uL (ref 3.8–10.6)

## 2018-01-21 MED ORDER — HYDROXYZINE HCL 50 MG PO TABS: 50.0000 mg | ORAL_TABLET | Freq: Three times a day (TID) | ORAL | 1 refills | Status: DC | PRN

## 2018-01-21 MED ORDER — CLONIDINE HCL 0.1 MG PO TABS
0.1000 mg | ORAL_TABLET | Freq: Once | ORAL | Status: AC
  Administered 2018-01-21: 0.1 mg via ORAL
  Filled 2018-01-21: qty 1

## 2018-01-21 NOTE — Plan of Care (Signed)
Patient was very restless and pacing the unit at bedtime and also following some other patients. Patient redirected of his behavior. Patient was given Atarax 50 mg as needed for anxiety and Trazodone 100 mg as needed for insomnia. PRN medications were effective. Patient calmed down and went to bed. Denied any SI/HI. Will continue every 15 minutes checks.

## 2018-01-21 NOTE — Progress Notes (Signed)
Patient ID: [name] [name], male   DOB: 10/17/1995, 22 y.o.   MRN: [id number] Several attempts today to call Pt's mother.  None of those calls have been returned at this time.  Pt discharged cancelled per MD as she does not feel it would be appropriate at this time to discharge the Pt without confirming the discharge plan with his mother.  Jake SharkSara Lunette Tapp, LCSW

## 2018-01-21 NOTE — Progress Notes (Signed)
[facility] MD Progress Note  01/21/2018 6:46 PM [name] [name] Pacer  MRN:  [id number]  Subjective:    [name] denies any symptoms of depression, anxiety or psychosis. We planned to discharge him following family meeting. The mother, who was interested in his care initially, stopped responding to our phone calls is the past 3 days. The patient has no resources and little insight into mental illness. He will not be able to continue on Clozapine without support from family. If he needs to be discharged to the homeless shelter, we need to switch his medications to injectable.  Principal Problem: Schizophrenia, undifferentiated (HCC) Diagnosis:   Patient Active Problem List   Diagnosis Date Noted  . Schizophrenia, undifferentiated (HCC) [F20.3] 12/30/2017    Priority: High  . Noncompliance [Z91.19] 12/30/2017  . Aggressive behavior of adolescent [R46.89] 10/24/2015   Total Time spent with patient: 20 minutes  Past Psychiatric History: schizophrenia  Past Medical History:  Past Medical History:  Diagnosis Date  . Asthma   . Depression   . Psychosis [facility](HCC)     Past Surgical History:  Procedure Laterality Date  . BACK SURGERY     Family History: History reviewed. No pertinent family history. Family Psychiatric  History: none Social History:  Social History   Substance and Sexual Activity  Alcohol Use No     Social History   Substance and Sexual Activity  Drug Use Yes  . Types: Marijuana    Social History   Socioeconomic History  . Marital status: Single    Spouse name: Not on file  . Number of children: Not on file  . Years of education: Not on file  . Highest education level: Not on file  Occupational History  . Not on file  Social Needs  . Financial resource strain: Not on file  . Food insecurity:    Worry: Not on file    Inability: Not on file  . Transportation needs:    Medical: Not on file    Non-medical: Not on file  Tobacco Use  . Smoking status: Never Smoker   . Smokeless tobacco: Never Used  Substance and Sexual Activity  . Alcohol use: No  . Drug use: Yes    Types: Marijuana  . Sexual activity: Not Currently  Lifestyle  . Physical activity:    Days per week: Not on file    Minutes per session: Not on file  . Stress: Not on file  Relationships  . Social connections:    Talks on phone: Not on file    Gets together: Not on file    Attends religious service: Not on file    Active member of club or organization: Not on file    Attends meetings of clubs or organizations: Not on file    Relationship status: Not on file  Other Topics Concern  . Not on file  Social History Narrative  . Not on file   Additional Social History:                         Sleep: Fair  Appetite:  Fair  Current Medications: Current Facility-Administered Medications  Medication Dose Route Frequency Provider Last Rate Last Dose  . acetaminophen (TYLENOL) tablet 650 mg  650 mg Oral Q6H PRN [name], [name], MD      . alum & mag hydroxide-simeth (MAALOX/MYLANTA) 200-200-20 MG/5ML suspension 30 mL  30 mL Oral Q4H PRN [name], [name], MD      .  cloZAPine (CLOZARIL) tablet 300 mg  300 mg Oral QHS Essa Malachi B, MD   300 mg at 01/20/18 2145  . hydrOXYzine (ATARAX/VISTARIL) tablet 50 mg  50 mg Oral TID PRN [name], [name], MD   50 mg at 01/21/18 0949  . magnesium hydroxide (MILK OF MAGNESIA) suspension 30 mL  30 mL Oral Daily PRN [name], [name], MD   30 mL at 01/15/18 1308  . metFORMIN (GLUCOPHAGE) tablet 500 mg  500 mg Oral Q breakfast Audryana Hockenberry B, MD   500 mg at 01/21/18 0805  . metoprolol tartrate (LOPRESSOR) tablet 12.5 mg  12.5 mg Oral BID Rondall Radigan B, MD   12.5 mg at 01/21/18 1641  . traZODone (DESYREL) tablet 100 mg  100 mg Oral QHS PRN [name], [name], MD   100 mg at 01/20/18 2146    Lab Results:  Results for orders placed or performed during the hospital encounter of 12/30/17 (from the past 48 hour(s))  CBC with  Differential/Platelet     Status: None   Collection Time: 01/21/18  7:11 AM  Result Value Ref Range   WBC 5.5 3.8 - 10.6 K/uL   RBC 5.77 4.40 - 5.90 MIL/uL   Hemoglobin 16.5 13.0 - 18.0 g/dL   HCT 16.1 09.6 - 04.5 %   MCV 82.6 80.0 - 100.0 fL   MCH 28.6 26.0 - 34.0 pg   MCHC 34.6 32.0 - 36.0 g/dL   RDW 40.9 81.1 - 91.4 %   Platelets 276 150 - 440 K/uL   Neutrophils Relative % 45 %   Neutro Abs 2.4 1.4 - 6.5 K/uL   Lymphocytes Relative 44 %   Lymphs Abs 2.4 1.0 - 3.6 K/uL   Monocytes Relative 11 %   Monocytes Absolute 0.6 0.2 - 1.0 K/uL   Eosinophils Relative 0 %   Eosinophils Absolute 0.0 0 - 0.7 K/uL   Basophils Relative 0 %   Basophils Absolute 0.0 0 - 0.1 K/uL    Comment: Performed at [facility], [address] [place], [place] [address]    Blood Alcohol level:  Lab Results  Component Value Date   [facility] <10 12/30/2017   ETH <5 09/29/2016    Metabolic Disorder Labs: Lab Results  Component Value Date   HGBA1C 4.6 (L) 12/30/2017   MPG 85.32 12/30/2017   No results found for: PROLACTIN Lab Results  Component Value Date   CHOL 185 12/30/2017   TRIG 125 12/30/2017   HDL 63 12/30/2017   CHOLHDL 2.9 12/30/2017   VLDL 25 12/30/2017   LDLCALC 97 12/30/2017    Physical Findings: AIMS:  , ,  ,  ,    CIWA:    COWS:     Musculoskeletal: Strength & Muscle Tone: within normal limits Gait & Station: normal Patient leans: N/A  Psychiatric Specialty Exam: Physical Exam  Nursing note and vitals reviewed. Psychiatric: His speech is normal. Judgment normal. His affect is blunt. He is withdrawn. Thought content is delusional. Cognition and memory are normal.    Review of Systems  Neurological: Negative.   Psychiatric/Behavioral: Negative.   All other systems reviewed and are negative.   Blood pressure (!) 130/97, pulse (!) 102, temperature (!) 97.5 F (36.4 C), temperature source Oral, resp. rate 18, height 6\' 2"  (1.88 m), weight 83.5 kg (184 lb), SpO2 100  %.Body mass index is 23.62 kg/m.  General Appearance: Casual  Eye Contact:  Good  Speech:  Clear and Coherent  Volume:  Normal  Mood:  Euthymic  Affect:  Constricted  Thought Process:  Goal Directed and Descriptions of Associations: Intact  Orientation:  Full (Time, Place, and Person)  Thought Content:  NA  Suicidal Thoughts:  No  Homicidal Thoughts:  No  Memory:  Immediate;   Fair Recent;   Fair Remote;   Fair  Judgement:  Poor  Insight:  Lacking  Psychomotor Activity:  Normal  Concentration:  Concentration: Fair and Attention Span: Fair  Recall:  [employer] of Knowledge:  Fair  Language:  Fair  Akathisia:  No  Handed:  Right  AIMS (if indicated):     Assets:  Communication Skills Desire for Improvement Financial Resources/Insurance Physical Health Resilience  ADL's:  Intact  Cognition:  WNL  Sleep:  Number of Hours: 6.15     Treatment Plan Summary: Daily contact with patient to assess and evaluate symptoms and progress in treatment and Medication management   Mr. Garriga is a 22 year old male with a history of schizophrenia admitted for psychotic break in the context of treatmentnoncompliance.He has been compliant with treatment while in the hospital. We were able to titrate his dose to 300 mg nightly. He tolerated it well.    #Psychosis, improved -continueClozaril 300 mg nightly for psychosis -trazodone 100 mg po nightly for insomnia -Vistaril was available for anxiety  #Tachycardia and HTN,VS are stable -continueMetoprolol12.5 mg BID  #Metabolic syndrome prevention -start Metformin 500 mg daily  #Labs -lipid panel, TSH and [facility]Care unremerkable -EKGreviewed, NSR with QTc 374  #Social -in spite of multiple attempts, we have been unable to speak with the mother in the last 3 days of hospitalization and did not complete family meeting as we hoped -patient has limited insight into his problems and has not been able to accept diagnosis of  schizophrenia -he refuses to apply for disability -we petitioned court for 90 days of involuntary outpatient psychiatric commitment to [facility]  #Disposition -discharged to home with his mother -follow up with [facility]    Kristine Linea, MD 01/21/2018, 6:46 PM

## 2018-01-21 NOTE — Progress Notes (Signed)
Recreation Therapy Notes  Date: 01/21/2018      Time: 9:30 am  Location: Craft Room  Behavioral response: Disengaged   Intervention Topic: Communication  Discussion/Intervention: Group content today was focused on communication. The group defined communication and ways to communicate with others. Individuals stated reason why communication is important and some reasons to communicate with others. Patients expressed if they thought they were good at communicating with others and ways they could improve their communication skills. The group identified important parts of communication and some experiences they have had in the past with communication. The group participated in the intervention "What is that?", where they had a chance to test out their communication skills and identify ways to improve their communication techniques.  Clinical Observations/Feedback:  Patient came to group and put his head down on the table. Individual was not engaged in group with peers or staff. He provided no contribution towards group.  Kissa Campoy LRT/CTRS         Lettie Czarnecki 01/21/2018 11:53 AM

## 2018-01-21 NOTE — Progress Notes (Signed)
Clozapine monitoring Consult   22 yo male current order for clozapine 300 mg PO at bedtime.  New restart.  Patient had been off meds for a while before admission  06/19  ANC 1800 06/26  ANC 1600 07/03  ANC 2400 Information entered into Clozapine registry and pt eligible to receive clozapine Next labs due in a week - ordered for 07/10  Pharmacy will continue to follow.   Burnis Medinodney Berl Bonfanti, PhamD 01/21/2018 8:50 AM

## 2018-01-22 MED ORDER — DIVALPROEX SODIUM 500 MG PO DR TAB
500.0000 mg | DELAYED_RELEASE_TABLET | Freq: Three times a day (TID) | ORAL | Status: DC
  Administered 2018-01-22 – 2018-01-28 (×18): 500 mg via ORAL
  Filled 2018-01-22 (×18): qty 1

## 2018-01-22 NOTE — Progress Notes (Signed)
[facility] MD Progress Note  01/22/2018 12:01 PM [name] [name]  MRN:  [id number]  Subjective:    Mr. Minahan is very disappointed that he is still in the hospital. He was hoping to be discharged for the holidays. He is completely dependent on his mother as he has no place to go or any income to pay for his medications. He is on Clozapine. We have attempted to get in contact with his mother for the past 4 days with no success. I do not feel that it is safe to send him out without any support.   He became very agitated, loud, banging on my office door demanding discharge because "it has been too long". He then told me that his mother and myself are mentally ill, he is 22 and can make his own decisions. 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 He later came back calm and collected and apologized for his behavior.   He is attempting to call Mr. Davene Costain, a friend, [phone] with whom he hopes to stay.  Principal Problem: Schizophrenia, undifferentiated (HCC) Diagnosis:   Patient Active Problem List   Diagnosis Date Noted  . Schizophrenia, undifferentiated (HCC) [F20.3] 12/30/2017    Priority: High  . Noncompliance [Z91.19] 12/30/2017  . Aggressive behavior of adolescent [R46.89] 10/24/2015   Total Time spent with patient: 20 minutes  Past Psychiatric History: schizophrenia  Past Medical History:  Past Medical History:  Diagnosis Date  . Asthma   . Depression   . Psychosis [facility])     Past Surgical History:  Procedure Laterality Date  . BACK SURGERY     Family History: History reviewed. No pertinent family history. Family Psychiatric  History: none Social History:  Social History   Substance and Sexual Activity  Alcohol Use No     Social History   Substance and Sexual Activity  Drug Use Yes  . Types: Marijuana    Social History   Socioeconomic History  . Marital status: Single    Spouse name: Not on file  .  Number of children: Not on file  . Years of education: Not on file  . Highest education level: Not on file  Occupational History  . Not on file  Social Needs  . Financial resource strain: Not on file  . Food insecurity:    Worry: Not on file    Inability: Not on file  . Transportation needs:    Medical: Not on file    Non-medical: Not on file  Tobacco Use  . Smoking status: Never Smoker  . Smokeless tobacco: Never Used  Substance and Sexual Activity  . Alcohol use: No  . Drug use: Yes    Types: Marijuana  . Sexual activity: Not Currently  Lifestyle  . Physical activity:    Days per week: Not on file    Minutes per session: Not on file  . Stress: Not on file  Relationships  . Social connections:    Talks on phone: Not on file    Gets together: Not on file    Attends religious service: Not on file    Active member of club or organization: Not on file    Attends meetings of clubs or organizations: Not on file    Relationship status: Not on file  Other Topics Concern  . Not on file  Social History Narrative  . Not on file   Additional Social  History:                         Sleep: Fair  Appetite:  Fair  Current Medications: Current Facility-Administered Medications  Medication Dose Route Frequency Provider Last Rate Last Dose  . acetaminophen (TYLENOL) tablet 650 mg  650 mg Oral Q6H PRN [name], [name], MD      . alum & mag hydroxide-simeth (MAALOX/MYLANTA) 200-200-20 MG/5ML suspension 30 mL  30 mL Oral Q4H PRN [name], [name], MD      . cloZAPine (CLOZARIL) tablet 300 mg  300 mg Oral QHS Broady Lafoy B, MD   300 mg at 01/21/18 2220  . hydrOXYzine (ATARAX/VISTARIL) tablet 50 mg  50 mg Oral TID PRN [name], [name], MD   50 mg at 01/21/18 2220  . magnesium hydroxide (MILK OF MAGNESIA) suspension 30 mL  30 mL Oral Daily PRN [name], [name], MD   30 mL at 01/15/18 1308  . metFORMIN (GLUCOPHAGE) tablet 500 mg  500 mg Oral Q breakfast Clerance Umland  B, MD   500 mg at 01/21/18 0805  . metoprolol tartrate (LOPRESSOR) tablet 12.5 mg  12.5 mg Oral BID Jamayah Myszka B, MD   12.5 mg at 01/21/18 1641  . traZODone (DESYREL) tablet 100 mg  100 mg Oral QHS PRN [name], [name], MD   100 mg at 01/20/18 2146    Lab Results:  Results for orders placed or performed during the hospital encounter of 12/30/17 (from the past 48 hour(s))  CBC with Differential/Platelet     Status: None   Collection Time: 01/21/18  7:11 AM  Result Value Ref Range   WBC 5.5 3.8 - 10.6 K/uL   RBC 5.77 4.40 - 5.90 MIL/uL   Hemoglobin 16.5 13.0 - 18.0 g/dL   HCT 16.147.7 09.640.0 - 04.552.0 %   MCV 82.6 80.0 - 100.0 fL   MCH 28.6 26.0 - 34.0 pg   MCHC 34.6 32.0 - 36.0 g/dL   RDW 40.913.4 81.111.5 - 91.414.5 %   Platelets 276 150 - 440 K/uL   Neutrophils Relative % 45 %   Neutro Abs 2.4 1.4 - 6.5 K/uL   Lymphocytes Relative 44 %   Lymphs Abs 2.4 1.0 - 3.6 K/uL   Monocytes Relative 11 %   Monocytes Absolute 0.6 0.2 - 1.0 K/uL   Eosinophils Relative 0 %   Eosinophils Absolute 0.0 0 - 0.7 K/uL   Basophils Relative 0 %   Basophils Absolute 0.0 0 - 0.1 K/uL    Comment: Performed at [facility], [address] [place], [place] [address]    Blood Alcohol level:  Lab Results  Component Value Date   [facility] 12/30/2017   ETH <5 09/29/2016    Metabolic Disorder Labs: Lab Results  Component Value Date   HGBA1C 4.6 (L) 12/30/2017   MPG 85.32 12/30/2017   No results found for: PROLACTIN Lab Results  Component Value Date   CHOL 185 12/30/2017   TRIG 125 12/30/2017   HDL 63 12/30/2017   CHOLHDL 2.9 12/30/2017   VLDL 25 12/30/2017   LDLCALC 97 12/30/2017    Physical Findings: AIMS:  , ,  ,  ,    CIWA:    COWS:     Musculoskeletal: Strength & Muscle Tone: within normal limits Gait & Station: normal Patient leans: N/A  Psychiatric Specialty Exam: Physical Exam  Nursing note and vitals reviewed. Psychiatric: His affect is angry, labile and inappropriate.  His  speech is rapid and/or pressured. He is agitated. Thought content is delusional. Cognition and memory are impaired. He expresses impulsivity.    Review of Systems  Neurological: Negative.   Psychiatric/Behavioral: Positive for hallucinations.  All other systems reviewed and are negative.   Blood pressure (!) 130/97, pulse (!) 102, temperature (!) 97.5 F (36.4 C), temperature source Oral, resp. rate 18, height 6\' 2"  (1.88 m), weight 83.5 kg (184 lb), SpO2 100 %.Body mass index is 23.62 kg/m.  General Appearance: Casual  Eye Contact:  Good  Speech:  Clear and Coherent and Pressured  Volume:  Increased  Mood:  Euphoric  Affect:  Congruent  Thought Process:  Irrelevant and Descriptions of Associations: Tangential  Orientation:  Full (Time, Place, and Person)  Thought Content:  Delusions  Suicidal Thoughts:  No  Homicidal Thoughts:  No  Memory:  Immediate;   Fair Recent;   Fair Remote;   Fair  Judgement:  Poor  Insight:  Lacking  Psychomotor Activity:  Increased  Concentration:  Concentration: Fair and Attention Span: Fair  Recall:  [employer] of Knowledge:  Fair  Language:  Fair  Akathisia:  No  Handed:  Right  AIMS (if indicated):     Assets:  Communication Skills Desire for Improvement Financial Resources/Insurance Physical Health Resilience Social Support  ADL's:  Intact  Cognition:  WNL  Sleep:  Number of Hours: 7.5     Treatment Plan Summary: Daily contact with patient to assess and evaluate symptoms and progress in treatment and Medication management   Mr. Napoli is a 22 year old male with a history of schizophrenia admitted for psychotic break in the context of treatmentnoncompliance.He has been compliant with treatment while in the hospital. We were able to titrate his dose to 300 mg nightly. He tolerated it well.    #Psychosis, improved -continueClozaril 300 mg nightly for psychosis -trazodone 100 mg po nightly for insomnia -Vistaril was available for  anxiety  #Tachycardia and HTN,VS are stable -continueMetoprolol12.5 mg BID  #Metabolic syndrome prevention -start Metformin 500 mg daily  #Labs -lipid panel, TSH and A1Care unremerkable -EKGreviewed, NSR with QTc 374  #Social -in spite of multiple attempts, we have been unable to speak with the mother in the last 3 days of hospitalization and did not complete family meeting as we hoped -patient has limited insight into his problems and has not been able to accept diagnosis of schizophrenia -he refuses to apply for disability -we petitioned court for 90 days of involuntary outpatient psychiatric commitment to [facility]   #Disposition -unable to contact his mother to formalize discharge plans  -follow up with [facility]     Kristine Linea, MD 01/22/2018, 12:01 PM

## 2018-01-22 NOTE — Plan of Care (Signed)
  Problem: Activity: Goal: Will verbalize the importance of balancing activity with adequate rest periods Outcome: Progressing  Patient is interacting and resting appropriately with peers and staff, visible I the milieu .

## 2018-01-22 NOTE — Progress Notes (Signed)
Received [name] [name] this AM in his room asleep, he refused breakfast and his medications. Later, he got up and wanted to go home. He was instructed to call his mother to assist with his discharge. This Clinical research associatewriter is unsure if he actually spoke with his mother. He started pacing around the nurses station and asked this writer to not talk to him. Later he apolized for his rude behavior. He was compliant with his PM medications. His mother called in the late afternoon and stated she will call the doctor tomorrow between 0930 1000 hrs to set up an appointment before her son's discharge.

## 2018-01-22 NOTE — Progress Notes (Signed)
Patient is alert and oriented x 4, no distress noted, denies SI/HI/AVH, but noted responding to internal stimuli., affect is flat but brightens upon approach, patient's thoughts are organized. Patient is interacting appropriately with peers and staff. Patient was offered support and encouraged to attend evening wrap up group. Patient did not attend group, 15 minutes safety checks maintained will continue to monitor

## 2018-01-23 LAB — CLOZAPINE (CLOZARIL)
Clozapine Lvl: 413 ng/mL (ref 350–650)
NORCLOZAPINE: 220 ng/mL
TOTAL(CLOZ+ NORCLOZ): 633 ng/mL

## 2018-01-23 MED ORDER — OLANZAPINE 10 MG PO TABS
10.0000 mg | ORAL_TABLET | Freq: Two times a day (BID) | ORAL | Status: DC | PRN
  Administered 2018-01-23: 10 mg via ORAL
  Filled 2018-01-23: qty 1

## 2018-01-23 MED ORDER — OLANZAPINE 10 MG IM SOLR: 10.0000 mg | Freq: Two times a day (BID) | INTRAMUSCULAR | Status: DC | PRN

## 2018-01-23 NOTE — Tx Team (Signed)
Interdisciplinary Treatment and Diagnostic Plan Update  01/23/2018 Time of Session: 10:30am [name] [name] MRN: [id number]  Principal Diagnosis: Schizoaffective disorder, bipolar type (HCC)  Secondary Diagnoses: Principal Problem:   Schizoaffective disorder, bipolar type (HCC) Active Problems:   Noncompliance   Current Medications:  Current Facility-Administered Medications  Medication Dose Route Frequency Provider Last Rate Last Dose  . acetaminophen (TYLENOL) tablet 650 mg  650 mg Oral Q6H PRN [name], [name], MD      . alum & mag hydroxide-simeth (MAALOX/MYLANTA) 200-200-20 MG/5ML suspension 30 mL  30 mL Oral Q4H PRN [name], [name], MD      . cloZAPine (CLOZARIL) tablet 300 mg  300 mg Oral QHS [name], [name], MD   300 mg at 01/22/18 2119  . divalproex (DEPAKOTE) DR tablet 500 mg  500 mg Oral Q8H [name], [name], MD   500 mg at 01/23/18 5284  . hydrOXYzine (ATARAX/VISTARIL) tablet 50 mg  50 mg Oral TID PRN [name], [name], MD   50 mg at 01/21/18 2220  . magnesium hydroxide (MILK OF MAGNESIA) suspension 30 mL  30 mL Oral Daily PRN [name], [name], MD   30 mL at 01/15/18 1308  . metFORMIN (GLUCOPHAGE) tablet 500 mg  500 mg Oral Q breakfast [name], [name], MD   500 mg at 01/23/18 1112  . metoprolol tartrate (LOPRESSOR) tablet 12.5 mg  12.5 mg Oral BID [name], [name], MD   12.5 mg at 01/23/18 1111  . OLANZapine (ZYPREXA) tablet 10 mg  10 mg Oral BID PRN [name], [name], MD       Or  . OLANZapine (ZYPREXA) injection 10 mg  10 mg Intramuscular BID PRN [name], [name], MD      . traZODone (DESYREL) tablet 100 mg  100 mg Oral QHS PRN [name], [name], MD   100 mg at 01/22/18 2119   PTA Medications: Medications Prior to Admission  Medication Sig Dispense Refill Last Dose  . doxycycline (VIBRA-TABS) 100 MG tablet Take 1 tablet (100 mg total) by mouth 2 (two) times daily. (Patient not taking: Reported on 12/30/2017) 19 tablet 0 Not Taking at Unknown  time  . HYDROcodone-acetaminophen (NORCO) 5-325 MG tablet Take 1 tablet by mouth every 6 (six) hours as needed. (Patient not taking: Reported on 12/30/2017) 10 tablet 0 Not Taking at Unknown time    Patient Stressors: Medication change or noncompliance  Patient Strengths: Capable of independent living Communication skills Physical Health Supportive family/friends  Treatment Modalities: Medication Management, Group therapy, Case management,  1 to 1 session with clinician, Psychoeducation, Recreational therapy.   Physician Treatment Plan for Primary Diagnosis: Schizoaffective disorder, bipolar type (HCC) Long Term Goal(s): Improvement in symptoms so as ready for discharge NA   Short Term Goals: Ability to identify changes in lifestyle to reduce recurrence of condition will improve Ability to verbalize feelings will improve Ability to disclose and discuss suicidal ideas Ability to demonstrate self-control will improve Ability to identify and develop effective coping behaviors will improve Ability to maintain clinical measurements within normal limits will improve Compliance with prescribed medications will improve Ability to identify triggers associated with substance abuse/mental health issues will improve NA  Medication Management: Evaluate patient's response, side effects, and tolerance of medication regimen.  Therapeutic Interventions: 1 to 1 sessions, Unit Group sessions and Medication administration.  Evaluation of Outcomes: Progressing  Physician Treatment Plan for Secondary Diagnosis: Principal Problem:   Schizoaffective disorder, bipolar type (HCC) Active Problems:   Noncompliance  Long Term  Goal(s): Improvement in symptoms so as ready for discharge NA   Short Term Goals: Ability to identify changes in lifestyle to reduce recurrence of condition will improve Ability to verbalize feelings will improve Ability to disclose and discuss suicidal ideas Ability to  demonstrate self-control will improve Ability to identify and develop effective coping behaviors will improve Ability to maintain clinical measurements within normal limits will improve Compliance with prescribed medications will improve Ability to identify triggers associated with substance abuse/mental health issues will improve NA     Medication Management: Evaluate patient's response, side effects, and tolerance of medication regimen.  Therapeutic Interventions: 1 to 1 sessions, Unit Group sessions and Medication administration.  Evaluation of Outcomes: Progressing   RN Treatment Plan for Primary Diagnosis: Schizoaffective disorder, bipolar type (HCC) Long Term Goal(s): Knowledge of disease and therapeutic regimen to maintain health will improve  Short Term Goals: Ability to verbalize feelings will improve, Ability to identify and develop effective coping behaviors will improve and Compliance with prescribed medications will improve  Medication Management: RN will administer medications as ordered by provider, will assess and evaluate patient's response and provide education to patient for prescribed medication. RN will report any adverse and/or side effects to prescribing provider.  Therapeutic Interventions: 1 on 1 counseling sessions, Psychoeducation, Medication administration, Evaluate responses to treatment, Monitor vital signs and CBGs as ordered, Perform/monitor CIWA, COWS, AIMS and Fall Risk screenings as ordered, Perform wound care treatments as ordered.  Evaluation of Outcomes: Progressing   LCSW Treatment Plan for Primary Diagnosis: Schizoaffective disorder, bipolar type (HCC) Long Term Goal(s): Safe transition to appropriate next level of care at discharge, Engage patient in therapeutic group addressing interpersonal concerns.  Short Term Goals: Engage patient in aftercare planning with referrals and resources, Increase social support, Facilitate acceptance of mental  health diagnosis and concerns, Identify triggers associated with mental health/substance abuse issues and Increase skills for wellness and recovery  Therapeutic Interventions: Assess for all discharge needs, 1 to 1 time with Social worker, Explore available resources and support systems, Assess for adequacy in community support network, Educate family and significant other(s) on suicide prevention, Complete Psychosocial Assessment, Interpersonal group therapy.  Evaluation of Outcomes: Progressing   Progress in Treatment: Attending groups: Yes. Participating in groups: No. Taking medication as prescribed: Yes. Toleration medication: Yes. Family/Significant other contact made: Yes, individual(s) contacted:  pt's mother, Iva Boopiman Babikir has been contacted by tx team members Patient understands diagnosis: Yes. Discussing patient identified problems/goals with staff: Yes. Medical problems stabilized or resolved: Yes. Denies suicidal/homicidal ideation: Yes. Issues/concerns per patient self-inventory: No. Other:   New problem(s) identified: No, Describe:  None  New Short Term/Long Term Goal(s): *Patient did not attend.*  Patient Goals:  *Patient did not attend.*  Discharge Plan or Barriers:   A barrier to discharge is that pt is taking Cloziril, which has to be monitored very closely. There is concern that he will not to the required follow-up to remain on this medication.  Pt's mother has assisted him in the past with medication compliance, but pt states that he wants to go live with a friend following discharge.  Pt's medication may have to be changed if he is not going to be compliant with monitoring requirements of his current medication.  Reason for Continuation of Hospitalization: Medication stabilization Other; describe Mood instability/lability  Estimated Length of Stay: 3-5 days  Recreational Therapy: Patient Stressors: N/A  Patient Goal: Patient will engage in interactions  with peers and staff in pro-social manner at least  2x within 5 recreation therapy group sessions  Attendees: Patient: 01/23/2018 2:29 PM  Physician: Dr. Kristine Linea MD 01/23/2018 2:29 PM  Nursing: 01/23/2018 2:29 PM  RN Care Manager: 01/23/2018 2:29 PM  Social Worker: Huey Romans, LCSW 01/23/2018 2:29 PM  Recreational Therapist:  01/23/2018 2:29 PM  Other: Heidi Dach, LCSW 01/23/2018 2:29 PM  Other:  01/23/2018 2:29 PM  Other: Damian Leavell, Cha[place]in 01/23/2018 2:29 PM    Scribe for Treatment Team: Alease Frame, LCSW 01/23/2018 2:29 PM

## 2018-01-23 NOTE — Progress Notes (Signed)
[facility] MD Progress Note  01/23/2018 12:44 PM [name] [name]  MRN:  [id number]  Subjective:   [name] [name] met with his mother and treatment team (MD, SW, nurse and chaplain) he became agitated and threatening. Security guard had to assist with a show of force. He was started on Depakote yesterday. Level check next week. He was completely inaprropriate with the mother and providers. [place] diagnosis to schizoaffective disorder.  Principal Problem: Schizoaffective disorder, bipolar type ([place]) Diagnosis:   Patient Active Problem List   Diagnosis Date Noted  . Schizoaffective disorder, bipolar type ([place]) [F25.0] 12/30/2017    Priority: High  . Noncompliance [Z91.19] 12/30/2017  . Aggressive behavior of adolescent [R46.89] 10/24/2015   Total Time spent with patient: 45 minutes  Past Psychiatric History: schizophrenia  Past Medical History:  Past Medical History:  Diagnosis Date  . Asthma   . Depression   . Psychosis [facility])     Past Surgical History:  Procedure Laterality Date  . BACK SURGERY     Family History: History reviewed. No pertinent family history. Family Psychiatric  History: none Social History:  Social History   Substance and Sexual Activity  Alcohol Use No     Social History   Substance and Sexual Activity  Drug Use Yes  . Types: Marijuana    Social History   Socioeconomic History  . Marital status: Single    Spouse name: Not on file  . Number of children: Not on file  . Years of education: Not on file  . Highest education level: Not on file  Occupational History  . Not on file  Social Needs  . Financial resource strain: Not on file  . Food insecurity:    Worry: Not on file    Inability: Not on file  . Transportation needs:    Medical: Not on file    Non-medical: Not on file  Tobacco Use  . Smoking status: Never Smoker  . Smokeless tobacco: Never Used  Substance and Sexual Activity  . Alcohol use: No  . Drug use: Yes    Types: Marijuana  .  Sexual activity: Not Currently  Lifestyle  . Physical activity:    Days per week: Not on file    Minutes per session: Not on file  . Stress: Not on file  Relationships  . Social connections:    Talks on phone: Not on file    Gets together: Not on file    Attends religious service: Not on file    Active member of club or organization: Not on file    Attends meetings of clubs or organizations: Not on file    Relationship status: Not on file  Other Topics Concern  . Not on file  Social History Narrative  . Not on file   Additional Social History:                         Sleep: Fair  Appetite:  Fair  Current Medications: Current Facility-Administered Medications  Medication Dose Route Frequency Provider Last Rate Last Dose  . acetaminophen (TYLENOL) tablet 650 mg  650 mg Oral Q6H PRN [name], [name], MD      . alum & mag hydroxide-simeth (MAALOX/MYLANTA) 200-200-20 MG/5ML suspension 30 mL  30 mL Oral Q4H PRN [name], [name], MD      . cloZAPine (CLOZARIL) tablet 300 mg  300 mg Oral QHS [name], [name], MD   300 mg at 01/22/18 2119  .  divalproex (DEPAKOTE) DR tablet 500 mg  500 mg Oral Q8H [name], [name], MD   500 mg at 01/23/18 7782  . hydrOXYzine (ATARAX/VISTARIL) tablet 50 mg  50 mg Oral TID PRN [name], Madie Reno, MD   50 mg at 01/21/18 2220  . magnesium hydroxide (MILK OF MAGNESIA) suspension 30 mL  30 mL Oral Daily PRN [name], [name], MD   30 mL at 01/15/18 1308  . metFORMIN (GLUCOPHAGE) tablet 500 mg  500 mg Oral Q breakfast [name], [name], MD   500 mg at 01/23/18 1112  . metoprolol tartrate (LOPRESSOR) tablet 12.5 mg  12.5 mg Oral BID [name], [name], MD   12.5 mg at 01/23/18 1111  . traZODone (DESYREL) tablet 100 mg  100 mg Oral QHS PRN [name], Madie Reno, MD   100 mg at 01/22/18 2119    Lab Results: No results found for this or any previous visit (from the past 48 hour(s)).  Blood Alcohol level:  Lab Results  Component Value Date   ETH  <10 12/30/2017   ETH <5 42/35/3614    Metabolic Disorder Labs: Lab Results  Component Value Date   HGBA1C 4.6 (L) 12/30/2017   MPG 85.32 12/30/2017   No results found for: PROLACTIN Lab Results  Component Value Date   CHOL 185 12/30/2017   TRIG 125 12/30/2017   HDL 63 12/30/2017   CHOLHDL 2.9 12/30/2017   VLDL 25 12/30/2017   LDLCALC 97 12/30/2017    Physical Findings: AIMS:  , ,  ,  ,    CIWA:    COWS:     Musculoskeletal: Strength & Muscle Tone: within normal limits Gait & Station: normal Patient leans: N/A  Psychiatric Specialty Exam: Physical Exam  Nursing note and vitals reviewed. Psychiatric: His affect is angry, labile and inappropriate. His speech is rapid and/or pressured. He is agitated, aggressive and hyperactive. Thought content is paranoid and delusional. Cognition and memory are impaired. He expresses impulsivity.    Review of Systems  Neurological: Negative.   Psychiatric/Behavioral: Positive for hallucinations.  All other systems reviewed and are negative.   Blood pressure (!) 153/101, pulse 100, temperature (!) 97.5 F (36.4 C), temperature source Oral, resp. rate 18, height 6' 2"  (1.88 m), weight 83.5 kg (184 lb), SpO2 100 %.Body mass index is 23.62 kg/m.  General Appearance: Casual  Eye Contact:  Good  Speech:  Pressured  Volume:  Increased  Mood:  Angry, Dysphoric and Irritable  Affect:  Congruent  Thought Process:  Disorganized, Irrelevant and Descriptions of Associations: Loose  Orientation:  Full (Time, Place, and Person)  Thought Content:  Delusions and Paranoid Ideation  Suicidal Thoughts:  No  Homicidal Thoughts:  No  Memory:  Immediate;   Fair Recent;   Fair Remote;   Fair  Judgement:  Poor  Insight:  Lacking  Psychomotor Activity:  Increased  Concentration:  Concentration: Poor and Attention Span: Poor  Recall:  Poor  Fund of Knowledge:  Fair  Language:  Fair  Akathisia:  No  Handed:  Right  AIMS (if indicated):      Assets:  Communication Skills Desire for Improvement Financial Resources/Insurance Housing Physical Health Resilience Social Support Vocational/Educational  ADL's:  Intact  Cognition:  WNL  Sleep:  Number of Hours: 7.15     Treatment Plan Summary: Daily contact with patient to assess and evaluate symptoms and progress in treatment and Medication management   [name] [name] is a 22 year old male with a history of schizophrenia admitted  for psychotic break in the context of treatmentnoncompliance.He has been compliant with treatment while in the hospital. We were able to titrate his dose to 300 mg nightly. He tolerated it well.He however started displaying unstable mood with irritable and threatening behavion. Depakote added to his regimen.   #Agitation -Zyprexa 15 mg BID PRN or Zyprexa 10 mg BID PRN  #Mood/Psychosis, worse -continueClozaril 300 mg nightly for psychosis -start Depakote 500 mg TID -trazodone 100 mg po nightly for insomnia PRN -Vistaril was available for anxiety  #Tachycardia and HTN,VS are stable -[id number] mg BID  #Metabolic syndrome prevention -start Metformin 500 mg daily  #Labs -lipid panel, TSH and A1Care unremerkable -EKGreviewed, NSR with QTc 374  #Social -family meeting completed, patient not stable for discharge -mother prefers [facility] ACT team over [facility] peer support -patient has limited insight into his problems and has not been able to accept diagnosis of schizophrenia -he refuses to apply for disability -wepetitioned courtfor 90 days of involuntary outpatient psychiatric commitment to [facility]  #Disposition -discharge with family   -follow up with [facility]    Orson Slick, MD 01/23/2018, 12:44 PM

## 2018-01-23 NOTE — Progress Notes (Signed)
[facility] Opinion Progress Note for Medication Administration to Non-consenting Patients (For Involuntarily Committed Patients)  Patient: [name] [name] Date of Birth: 03-Oct-1995 MRN: [id number]  Reason for the Medication: The patient, without the benefit of the specific treatment measure, is incapable of participating in any available treatment plan that will give the patient a realistic opportunity of improving the patient's condition.  Consideration of Side Effects: Consideration of the side effects related to the medication plan has been given.  Rationale for Medication Administration: Has schizophrenia with significant psychosis and paranoia.  He has poor insight into his condition and with multiple hospitalizations and poor functioning outside the hospital is extremely unlikely to improve without the benefit of antipsychotic medication.    [name] RasmussenJohn [name], MD 01/23/18  2:56 PM   This documentation is good for (7) seven days from the date of the MD signature. New documentation must be completed every seven (7) days with detailed justification in the medical record if the patient requires continued non-emergent administration of psychotropic medications.
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
## 2018-01-23 NOTE — [facility] Group Notes (Signed)
LCSW Group Therapy Note  01/23/2018 1:00pm  Type of Therapy and Topic:  Group Therapy:  Feelings around Relapse and Recovery  Participation Level:  None   Description of Group:    Patients in this group will discuss emotions they experience before and after a relapse. They will process how experiencing these feelings, or avoidance of experiencing them, relates to having a relapse. Facilitator will guide patients to explore emotions they have related to recovery. Patients will be encouraged to process which emotions are more powerful. They will be guided to discuss the emotional reaction significant others in their lives may have to their relapse or recovery. Patients will be assisted in exploring ways to respond to the emotions of others without this contributing to a relapse.  Therapeutic Goals: 1. Patient will identify two or more emotions that lead to a relapse for them 2. Patient will identify two emotions that result when they relapse 3. Patient will identify two emotions related to recovery 4. Patient will demonstrate ability to communicate their needs through discussion and/or role plays   Summary of Patient Progress:  [name] [name] did not actively participate in today's discussion on feelings around relapse and recovery.  He did, however, stay for the entire group without disruption or inappropriate behavior.   Therapeutic Modalities:   Cognitive Behavioral Therapy Solution-Focused Therapy Assertiveness Training Relapse Prevention Therapy   [name] FrameSonya S Cora Brierley, LCSW 01/23/2018 1:50 PM

## 2018-01-23 NOTE — Plan of Care (Signed)
Patient is calm and resting without ant disturbances , contract for safety of self and others, sleep long hours, comply with his medications, 15 minute safety checks is maintained. Problem: Education: Goal: Knowledge of [facility] Education information/materials will improve Outcome: Progressing Goal: Emotional status will improve Outcome: Progressing Goal: Mental status will improve Outcome: Progressing Goal: Verbalization of understanding the information provided will improve Outcome: Progressing   Problem: Coping: Goal: Ability to verbalize frustrations and anger appropriately will improve Outcome: Progressing Goal: Ability to demonstrate self-control will improve Outcome: Progressing   Problem: Health Behavior/Discharge Planning: Goal: Identification of resources available to assist in meeting health care needs will improve Outcome: Progressing Goal: Compliance with treatment plan for underlying cause of condition will improve Outcome: Progressing   Problem: Elimination: Goal: Will not experience complications related to bowel motility Outcome: Progressing   Problem: Activity: Goal: Will verbalize the importance of balancing activity with adequate rest periods Outcome: Progressing   Problem: Coping: Goal: Coping ability will improve Outcome: Progressing Goal: Will verbalize feelings Outcome: Progressing
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
## 2018-01-24 DIAGNOSIS — F250 Schizoaffective disorder, bipolar type: Principal | ICD-10-CM

## 2018-01-24 NOTE — [facility] Group Notes (Signed)
LCSW Group Therapy Note  01/24/2018 1:15pm  Type of Therapy and Topic:  Group Therapy:  Feelings around Relapse and Recovery  Participation Level:  Minimal   Description of Group:    Patients in this group will discuss emotions they experience before and after a relapse. They will process how experiencing these feelings, or avoidance of experiencing them, relates to having a relapse. Facilitator will guide patients to explore emotions they have related to recovery. Patients will be encouraged to process which emotions are more powerful. They will be guided to discuss the emotional reaction significant others in their lives may have to their relapse or recovery. Patients will be assisted in exploring ways to respond to the emotions of others without this contributing to a relapse.  Therapeutic Goals: 1. Patient will identify two or more emotions that lead to a relapse for them 2. Patient will identify two emotions that result when they relapse 3. Patient will identify two emotions related to recovery 4. Patient will demonstrate ability to communicate their needs through discussion and/or role plays   Summary of Patient Progress: Pt only able to make it through introductions before excusing himself from group.  He remains focused on speaking with his mother, which is one of the reasons he remains hospitalized.     Therapeutic Modalities:   Cognitive Behavioral Therapy Solution-Focused Therapy Assertiveness Training Relapse Prevention Therapy   Glennon MacSara P Jaiveer Panas, LCSW 01/24/2018 4:04 PM

## 2018-01-24 NOTE — [facility] Group Notes (Signed)
[facility] Group Notes:  (Nursing/MHT/Case Management/Adjunct)  Date:  01/24/2018  Time:  10:31 PM  Type of Therapy:  Group Therapy  Participation Level:  Active  Participation Quality:  Appropriate  Affect:  Appropriate  Cognitive:  Appropriate  Insight:  Appropriate  Engagement in Group:  Engaged  Modes of Intervention:  Discussion  Summary of Progress/Problems: MHT reviewed rules and expectations of the unit. MHT informed patients of visitation and phone hours. MHT informed patients of routine checks throughout the night and reminded to cover up. MHT engaged patients with an introduction game. Game was to make 3 statements, 2 being true, one being false and the group deciding which was false. MHT had patients tell goal for the day and if they were able to accomplish it. MHT reviewed what was discussed in groups on this day, recovery and if they learned anything useful to do when released. MHT discussed the thought, feeling, behavioral model. MHT informed patients not to dwell on the negative things in life, but to focus on what was needed to make the change. MHT encouraged patients not to dwell on things they had no control over. MHT encouraged patients to make changes by doing things differently. MHT encouraged patients to make changes that would promote positive changes in their lives. [name] did not participate by making statements for the group to guess, but was engaged throughout the activity. [name] smiled and seemed to have fun trying to figure out what was true and false of other patients. [name] did introduce himself to the group. [name] 01/24/2018, 10:31 PM

## 2018-01-24 NOTE — Progress Notes (Signed)
[facility]  01/24/2018 2:22 PM [name] [name]  MRN:  [id number]  Subjective:   Pt labile and demanding at times, states he wants to talk to his friend or mama, wanting to go home. Denies AVH, SI/HI, took meds, denies side effects. Poor insight .  Principal Problem: Schizoaffective disorder, bipolar type (HCC) Diagnosis:   Patient Active Problem List   Diagnosis Date Noted  . Schizoaffective disorder, bipolar type (HCC) [F25.0] 12/30/2017  . Noncompliance [Z91.19] 12/30/2017  . Aggressive behavior of adolescent [R46.89] 10/24/2015   Total Time spent with patient: 25 min  Past Psychiatric History: schizophrenia  Past Medical History:  Past Medical History:  Diagnosis Date  . Asthma   . Depression   . Psychosis [facility](HCC)     Past Surgical History:  Procedure Laterality Date  . BACK SURGERY     Family History: History reviewed. No pertinent family history. Family Psychiatric  History: none Social History:  Social History   Substance and Sexual Activity  Alcohol Use No     Social History   Substance and Sexual Activity  Drug Use Yes  . Types: Marijuana    Social History   Socioeconomic History  . Marital status: Single    Spouse name: Not on file  . Number of children: Not on file  . Years of education: Not on file  . Highest education level: Not on file  Occupational History  . Not on file  Social Needs  . Financial resource strain: Not on file  . Food insecurity:    Worry: Not on file    Inability: Not on file  . Transportation needs:    Medical: Not on file    Non-medical: Not on file  Tobacco Use  . Smoking status: Never Smoker  . Smokeless tobacco: Never Used  Substance and Sexual Activity  . Alcohol use: No  . Drug use: Yes    Types: Marijuana  . Sexual activity: Not Currently  Lifestyle  . Physical activity:    Days per week: Not on file    Minutes per session: Not on file  . Stress: Not on file  Relationships  . Social connections:     Talks on phone: Not on file    Gets together: Not on file    Attends religious service: Not on file    Active member of club or organization: Not on file    Attends meetings of clubs or organizations: Not on file    Relationship status: Not on file  Other Topics Concern  . Not on file  Social History Narrative  . Not on file   Additional Social History:                         Sleep: Fair  Appetite:  Fair  Current Medications: Current Facility-Administered Medications  Medication Dose Route Frequency Provider Last Rate Last Dose  . acetaminophen (TYLENOL) tablet 650 mg  650 mg Oral Q6H PRN [name], [name], MD      . alum & mag hydroxide-simeth (MAALOX/MYLANTA) 200-200-20 MG/5ML suspension 30 mL  30 mL Oral Q4H PRN [name], [name], MD      . cloZAPine (CLOZARIL) tablet 300 mg  300 mg Oral QHS [name], [name], MD   300 mg at 01/23/18 2121  . divalproex (DEPAKOTE) DR tablet 500 mg  500 mg Oral Q8H [name], [name], MD   500 mg at 01/24/18 1316  . hydrOXYzine (ATARAX/VISTARIL)  tablet 50 mg  50 mg Oral TID PRN [name], [name], MD   50 mg at 01/23/18 2121  . magnesium hydroxide (MILK OF MAGNESIA) suspension 30 mL  30 mL Oral Daily PRN [name], [name], MD   30 mL at 01/15/18 1308  . metFORMIN (GLUCOPHAGE) tablet 500 mg  500 mg Oral Q breakfast [name], [name], MD   500 mg at 01/24/18 0914  . metoprolol tartrate (LOPRESSOR) tablet 12.5 mg  12.5 mg Oral BID [name], [name], MD   12.5 mg at 01/24/18 0914  . OLANZapine (ZYPREXA) tablet 10 mg  10 mg Oral BID PRN [name], [name], MD   10 mg at 01/23/18 2121   Or  . OLANZapine (ZYPREXA) injection 10 mg  10 mg Intramuscular BID PRN [name], [name], MD      . traZODone (DESYREL) tablet 100 mg  100 mg Oral QHS PRN [name], [name], MD   100 mg at 01/23/18 2121    Lab Results: No results found for this or any previous visit (from the past 48 hour(s)).  Blood Alcohol level:  Lab Results   Component Value Date   ETH <10 12/30/2017   ETH <5 09/29/2016    Metabolic Disorder Labs: Lab Results  Component Value Date   HGBA1C 4.6 (L) 12/30/2017   MPG 85.32 12/30/2017   No results found for: PROLACTIN Lab Results  Component Value Date   CHOL 185 12/30/2017   TRIG 125 12/30/2017   HDL 63 12/30/2017   CHOLHDL 2.9 12/30/2017   VLDL 25 12/30/2017   LDLCALC 97 12/30/2017    Physical Findings: AIMS:  , ,  ,  ,    CIWA:    COWS:     Musculoskeletal: Strength & Muscle Tone: within normal limits Gait & Station: normal Patient leans: N/A  Psychiatric Specialty Exam: Physical Exam  Nursing note and vitals reviewed. Psychiatric: His affect is angry, labile and inappropriate. His speech is rapid and/or pressured. He is agitated, aggressive and hyperactive. Thought content is paranoid and delusional. Cognition and memory are impaired. He expresses impulsivity.    Review of Systems  Neurological: Negative.   Psychiatric/Behavioral: Positive for hallucinations.  All other systems reviewed and are negative.   Blood pressure 120/81, pulse 82, temperature (!) 97.5 F (36.4 C), temperature source Oral, resp. rate 16, height 6\' 2"  (1.88 m), weight 83.5 kg (184 lb), SpO2 100 %.Body mass index is 23.62 kg/m.  General Appearance: Casual  Eye Contact:  Good  Speech:  Pressured  Volume:  Increased  Mood:  anxious  Affect:  Labile, restricted  Thought Process:  Disorganized, Irrelevant and Descriptions of Associations: Loose  Orientation:  Full (Time, Place, and Person)  Thought Content:  Delusions and Paranoid Ideation  Suicidal Thoughts:  No, denies  Homicidal Thoughts:  No  Memory:  Immediate;   Fair Recent;   Fair Remote;   Fair  Judgement:  Poor  Insight:  Lacking  Psychomotor Activity:  Increased  Concentration:  Concentration: Poor and Attention Span: Poor  Recall:  Poor  Fund of Knowledge:  Fair  Language:  Fair  Akathisia:  No  Handed:  Right  AIMS (if  indicated):     Assets:  Communication Skills Desire for Improvement Financial Resources/Insurance Housing Physical Health Resilience Social Support Vocational/Educational  ADL's:  Intact  Cognition:  WNL  Sleep:  Number of Hours: 7.5     Treatment Plan Summary: Daily contact with patient to assess and evaluate symptoms and  progress in treatment and Medication management   Mr. Nearhood is a 22 year old male with a history of schizophrenia admitted for psychotic break in the context of treatmentnoncompliance.pt labile, agitates at times.  Cont clozaril and Depakote.  ANC-2.4.   QTc 374     Beverly Sessions, MD 01/24/2018, 2:22 PMPatient ID: [name], male   DOB: 1996/03/27, 22 y.o.   MRN: [id number]

## 2018-01-24 NOTE — Plan of Care (Addendum)
Patient found in day room upon my arrival. Patient is visible and somewhat social this evening. Attends group. Denies SI/HI/AVH. Patient is minimally verbal but polite and calm. Reports depression due to not being discharged today. Compliant with HS medication and staff direction. Q 15 minute checks maintained. Will continue to monitor throughout the shift. Patient slept 7.5 hours. No apparent distress. Compliant with am medications. Will endorse care to oncoming shift.  Problem: Education: Goal: Emotional status will improve Outcome: Progressing Goal: Mental status will improve Outcome: Progressing   Problem: Coping: Goal: Ability to demonstrate self-control will improve Outcome: Progressing   Problem: Coping: Goal: Coping ability will improve Outcome: Progressing

## 2018-01-24 NOTE — Plan of Care (Signed)
D: Patient denies SI/HI/AVH. Patient verbally contracts for safety. Patient is calm, cooperative. Patient is impulsive and agitated at times. Patient is discharged focused. Patient is seen in milieu interacting with peers. Patient has no complaints at this time.  A: Patient was assessed by this nurse. Patient was oriented to unit. Patient's safety was maintained on unit. Q x 15 minute observation checks were completed for safety. Patient care plan was reviewed. Patient was offered support and encouragement. Patient was encourage to attend groups, participate in unit activities and continue with plan of care.    R: Patient has no complaints of pain at this time. Patient is receptive to treatment and safety maintained on unit.     Problem: Coping: Goal: Will verbalize feelings Outcome: Progressing   Problem: Education: Goal: Knowledge of [place] [facility] Education information/materials will improve Outcome: Not Progressing   Problem: Coping: Goal: Ability to demonstrate self-control will improve Outcome: Not Progressing   Problem: Activity: Goal: Will verbalize the importance of balancing activity with adequate rest periods Outcome: Not Progressing

## 2018-01-25 NOTE — [facility] Group Notes (Signed)
LCSW Group Therapy Notes 01/25/2018 1:00pm Type of Therapy and Topic:  Group Therapy:  Communication Participation Level:  Did Not Attend  Description of Group: Patients will identify how individuals communicate with one another appropriately and inappropriately.  Patients will be guided to discuss their thoughts, feelings and behaviors related to barriers when communicating.  The group will process together ways to execute positive and appropriate communication with attention given to how one uses behavior, tone and body language.  Patients will be encouraged to reflect on a situation where they were successfully able to communicate and what made this example successful.  Group will identify specific changes they are motivated to make in order to overcome communication barriers with self, peers, authority, and parents.  This group will be process-oriented with patients participating in exploration of their own experiences, giving and receiving support, and challenging self and other group members.   Therapeutic Goals 1. Patient will identify how people communicate (body language, facial expression, and electronics).  Group will also discuss tone, voice and how these impact what is communicated and what is received. 2. Patient will identify feelings (such as fear or worry), thought process and behaviors related to why people internalize feelings rather than express self openly. 3. Patient will identify two changes they are willing to make to overcome communication barriers 4. Members will then practice through role play how to communicate using I statements, I feel statements, and acknowledging feelings rather than displacing feelings on others Summary of Patient Progress:   Therapeutic Modalities Cognitive Behavioral Therapy Motivational Interviewing Solution Focused Therapy  Glennon MacSara P Lutisha Knoche, LCSW 01/25/2018 2:42 PM

## 2018-01-25 NOTE — Progress Notes (Signed)
[facility] MD Progress Note  01/25/2018 4:37 PM [name] [name]  MRN:  [id number]  Subjective:   Pt labile and demanding at times, calmer today, pt was covering his face with blanket, refusing to remove it,  states he is doing "fine" . Denies AVH, SI/HI, took meds, denies side effects. Poor insight .  Principal Problem: Schizoaffective disorder, bipolar type (HCC) Diagnosis:   Patient Active Problem List   Diagnosis Date Noted  . Schizoaffective disorder, bipolar type (HCC) [F25.0] 12/30/2017  . Noncompliance [Z91.19] 12/30/2017  . Aggressive behavior of adolescent [R46.89] 10/24/2015   Total Time spent with patient: 25 min  Past Psychiatric History: schizophrenia  Past Medical History:  Past Medical History:  Diagnosis Date  . Asthma   . Depression   . Psychosis [name])     Past Surgical History:  Procedure Laterality Date  . BACK SURGERY     Family History: History reviewed. No pertinent family history. Family Psychiatric  History: none Social History:  Social History   Substance and Sexual Activity  Alcohol Use No     Social History   Substance and Sexual Activity  Drug Use Yes  . Types: Marijuana    Social History   Socioeconomic History  . Marital status: Single    Spouse name: Not on file  . Number of children: Not on file  . Years of education: Not on file  . Highest education level: Not on file  Occupational History  . Not on file  Social Needs  . Financial resource strain: Not on file  . Food insecurity:    Worry: Not on file    Inability: Not on file  . Transportation needs:    Medical: Not on file    Non-medical: Not on file  Tobacco Use  . Smoking status: Never Smoker  . Smokeless tobacco: Never Used  Substance and Sexual Activity  . Alcohol use: No  . Drug use: Yes    Types: Marijuana  . Sexual activity: Not Currently  Lifestyle  . Physical activity:    Days per week: Not on file    Minutes per session: Not on file  . Stress: Not on file   Relationships  . Social connections:    Talks on phone: Not on file    Gets together: Not on file    Attends religious service: Not on file    Active member of club or organization: Not on file    Attends meetings of clubs or organizations: Not on file    Relationship status: Not on file  Other Topics Concern  . Not on file  Social History Narrative  . Not on file   Additional Social History:                         Sleep: Fair  Appetite:  Fair  Current Medications: Current Facility-Administered Medications  Medication Dose Route Frequency Provider Last Rate Last Dose  . acetaminophen (TYLENOL) tablet 650 mg  650 mg Oral Q6H PRN [name], [name], MD      . alum & mag hydroxide-simeth (MAALOX/MYLANTA) 200-200-20 MG/5ML suspension 30 mL  30 mL Oral Q4H PRN [name], [name], MD      . cloZAPine (CLOZARIL) tablet 300 mg  300 mg Oral QHS [name], [name], MD   300 mg at 01/24/18 2136  . divalproex (DEPAKOTE) DR tablet 500 mg  500 mg Oral Q8H [name], [name], MD   500 mg at  01/25/18 1505  . hydrOXYzine (ATARAX/VISTARIL) tablet 50 mg  50 mg Oral TID PRN [name], [name], MD   50 mg at 01/23/18 2121  . magnesium hydroxide (MILK OF MAGNESIA) suspension 30 mL  30 mL Oral Daily PRN [name], [name], MD   30 mL at 01/15/18 1308  . metFORMIN (GLUCOPHAGE) tablet 500 mg  500 mg Oral Q breakfast [name], [name], MD   500 mg at 01/25/18 0824  . metoprolol tartrate (LOPRESSOR) tablet 12.5 mg  12.5 mg Oral BID [name], [name], MD   12.5 mg at 01/25/18 1634  . OLANZapine (ZYPREXA) tablet 10 mg  10 mg Oral BID PRN [name], [name], MD   10 mg at 01/23/18 2121   Or  . OLANZapine (ZYPREXA) injection 10 mg  10 mg Intramuscular BID PRN [name], [name], MD      . traZODone (DESYREL) tablet 100 mg  100 mg Oral QHS PRN [name], [name], MD   100 mg at 01/24/18 2136    Lab Results: No results found for this or any previous visit (from the past 48  hour(s)).  Blood Alcohol level:  Lab Results  Component Value Date   ETH <10 12/30/2017   ETH <5 09/29/2016    Metabolic Disorder Labs: Lab Results  Component Value Date   HGBA1C 4.6 (L) 12/30/2017   MPG 85.32 12/30/2017   No results found for: PROLACTIN Lab Results  Component Value Date   CHOL 185 12/30/2017   TRIG 125 12/30/2017   HDL 63 12/30/2017   CHOLHDL 2.9 12/30/2017   VLDL 25 12/30/2017   LDLCALC 97 12/30/2017    Physical Findings: AIMS:  , ,  ,  ,    CIWA:    COWS:     Musculoskeletal: Strength & Muscle Tone: within normal limits Gait & Station: normal Patient leans: N/A  Psychiatric Specialty Exam: Physical Exam  Nursing note and vitals reviewed. Psychiatric: His affect is angry, labile and inappropriate. His speech is rapid and/or pressured. He is agitated, aggressive and hyperactive. Thought content is paranoid and delusional. Cognition and memory are impaired. He expresses impulsivity.    Review of Systems  Neurological: Negative.   Psychiatric/Behavioral: Positive for hallucinations.  All other systems reviewed and are negative.   Blood pressure (!) 131/91, pulse (!) 101, temperature 98.3 F (36.8 C), temperature source Oral, resp. rate 20, height 6\' 2"  (1.88 m), weight 83.5 kg (184 lb), SpO2 100 %.Body mass index is 23.62 kg/m.  General Appearance: Casual  Eye Contact:  poor  Speech:  Pressured  Volume:  Increased  Mood:  anxious  Affect:  Labile, restricted  Thought Process:  Disorganized, Irrelevant and Descriptions of Associations: Loose  Orientation:  Full (Time, Place, and Person)  Thought Content:  Delusions and Paranoid Ideation  Suicidal Thoughts:  No, denies  Homicidal Thoughts:  No  Memory:  Immediate;   Fair Recent;   Fair Remote;   Fair  Judgement:  Poor  Insight:  Lacking  Psychomotor Activity:  Increased  Concentration:  Concentration: Poor and Attention Span: Poor  Recall:  Poor  Fund of Knowledge:  Fair  Language:   Fair  Akathisia:  No  Handed:  Right  AIMS (if indicated):     Assets:  Communication Skills Desire for Improvement Financial Resources/Insurance Housing Physical Health Resilience Social Support Vocational/Educational  ADL's:  Intact  Cognition:  WNL  Sleep:  Number of Hours: 6.3     Treatment Plan Summary: Daily contact with  patient to assess and evaluate symptoms and progress in treatment and Medication management   Mr. Sharp is a 22 year old male with a history of schizophrenia . pt labile, agitates at times, guarded.  Cont clozaril and Depakote.  ANC-2.4.   QTc 374     Beverly Sessions, MD 01/25/2018, 4:37 PMPatient ID: [name] [name], male   DOB: Jul 07, 1996, 22 y.o.   MRN: [id number] Patient ID: [name] [name], male   DOB: [birth year], 22 y.o.   MRN: [id number]

## 2018-01-25 NOTE — Plan of Care (Signed)
Patient has improved in social activities with his peers but with minimal participation in groups, encouraged patient to incorporate his coping skills with the group  to improve his mental and emotional state, patient verbalized understanding of information provided and denies any SI/HI and no signs of AVH noted , patient contract for safety of self and others, 15 minute rounding is in progress no distress.sleep long hours Problem: Education: Goal: Knowledge of [place] [facility] Education information/materials will improve Outcome: Progressing Goal: Emotional status will improve Outcome: Progressing Goal: Mental status will improve Outcome: Progressing Goal: Verbalization of understanding the information provided will improve Outcome: Progressing   Problem: Coping: Goal: Ability to verbalize frustrations and anger appropriately will improve Outcome: Progressing Goal: Ability to demonstrate self-control will improve Outcome: Progressing   Problem: Health Behavior/Discharge Planning: Goal: Identification of resources available to assist in meeting health care needs will improve Outcome: Progressing Goal: Compliance with treatment plan for underlying cause of condition will improve Outcome: Progressing   Problem: Elimination: Goal: Will not experience complications related to bowel motility Outcome: Progressing   Problem: Activity: Goal: Will verbalize the importance of balancing activity with adequate rest periods Outcome: Progressing   Problem: Coping: Goal: Coping ability will improve Outcome: Progressing Goal: Will verbalize feelings Outcome: Progressing

## 2018-01-25 NOTE — Progress Notes (Addendum)
[facility] MD Progress Note  01/26/2018 11:34 AM Koki ROME ECHAVARRIA  MRN:  [id number]  Subjective:    Mr. Gelpi has been asleep all morning long and not willing to get up for me. Later, he has been demanding discharge. He has been very mean to his mother during our Friday meeting. He will stay with his mother following discharge so we expect his cooperation especially that he is on Clozapine. Yesterday, he followed a male patient around but could be redirected.   We will check VPA and ammonia level in am.  Principal Problem: Schizoaffective disorder, bipolar type (HCC) Diagnosis:   Patient Active Problem List   Diagnosis Date Noted  . Schizoaffective disorder, bipolar type (HCC) [F25.0] 12/30/2017    Priority: High  . Noncompliance [Z91.19] 12/30/2017  . Aggressive behavior of adolescent [R46.89] 10/24/2015   Total Time spent with patient: 20 minutes  Past Psychiatric History: schizophrenia  Past Medical History:  Past Medical History:  Diagnosis Date  . Asthma   . Depression   . Psychosis [facility])     Past Surgical History:  Procedure Laterality Date  . BACK SURGERY     Family History: History reviewed. No pertinent family history. Family Psychiatric  History: none Social History:  Social History   Substance and Sexual Activity  Alcohol Use No     Social History   Substance and Sexual Activity  Drug Use Yes  . Types: Marijuana    Social History   Socioeconomic History  . Marital status: Single    Spouse name: Not on file  . Number of children: Not on file  . Years of education: Not on file  . Highest education level: Not on file  Occupational History  . Not on file  Social Needs  . Financial resource strain: Not on file  . Food insecurity:    Worry: Not on file    Inability: Not on file  . Transportation needs:    Medical: Not on file    Non-medical: Not on file  Tobacco Use  . Smoking status: Never Smoker  . Smokeless tobacco: Never Used  Substance and  Sexual Activity  . Alcohol use: No  . Drug use: Yes    Types: Marijuana  . Sexual activity: Not Currently  Lifestyle  . Physical activity:    Days per week: Not on file    Minutes per session: Not on file  . Stress: Not on file  Relationships  . Social connections:    Talks on phone: Not on file    Gets together: Not on file    Attends religious service: Not on file    Active member of club or organization: Not on file    Attends meetings of clubs or organizations: Not on file    Relationship status: Not on file  Other Topics Concern  . Not on file  Social History Narrative  . Not on file   Additional Social History:                         Sleep: Fair  Appetite:  Fair  Current Medications: Current Facility-Administered Medications  Medication Dose Route Frequency Provider Last Rate Last Dose  . acetaminophen (TYLENOL) tablet 650 mg  650 mg Oral Q6H PRN [name], [name], MD      . alum & mag hydroxide-simeth (MAALOX/MYLANTA) 200-200-20 MG/5ML suspension 30 mL  30 mL Oral Q4H PRN [name], [name], MD      .  cloZAPine (CLOZARIL) tablet 300 mg  300 mg Oral QHS [name], [name], MD   300 mg at 01/25/18 2119  . divalproex (DEPAKOTE) DR tablet 500 mg  500 mg Oral Q8H [name], [name], MD   500 mg at 01/26/18 0658  . hydrOXYzine (ATARAX/VISTARIL) tablet 50 mg  50 mg Oral TID PRN [name], [name], MD   50 mg at 01/23/18 2121  . magnesium hydroxide (MILK OF MAGNESIA) suspension 30 mL  30 mL Oral Daily PRN [name], [name], MD   30 mL at 01/15/18 1308  . metFORMIN (GLUCOPHAGE) tablet 500 mg  500 mg Oral Q breakfast [name], [name], MD   500 mg at 01/26/18 0816  . metoprolol tartrate (LOPRESSOR) tablet 12.5 mg  12.5 mg Oral BID [name], [name], MD   12.5 mg at 01/26/18 0816  . OLANZapine (ZYPREXA) tablet 10 mg  10 mg Oral BID PRN [name], [name], MD   10 mg at 01/23/18 2121   Or  . OLANZapine (ZYPREXA) injection 10 mg  10 mg Intramuscular BID PRN  [name], [name], MD      . traZODone (DESYREL) tablet 100 mg  100 mg Oral QHS PRN [name], [name], MD   100 mg at 01/25/18 2120    Lab Results: No results found for this or any previous visit (from the past 48 hour(s)).  Blood Alcohol level:  Lab Results  Component Value Date   ETH <10 12/30/2017   ETH <5 09/29/2016    Metabolic Disorder Labs: Lab Results  Component Value Date   HGBA1C 4.6 (L) 12/30/2017   MPG 85.32 12/30/2017   No results found for: PROLACTIN Lab Results  Component Value Date   CHOL 185 12/30/2017   TRIG 125 12/30/2017   HDL 63 12/30/2017   CHOLHDL 2.9 12/30/2017   VLDL 25 12/30/2017   LDLCALC 97 12/30/2017    Physical Findings: AIMS:  , ,  ,  ,    CIWA:    COWS:     Musculoskeletal: Strength & Muscle Tone: within normal limits Gait & Station: normal Patient leans: N/A  Psychiatric Specialty Exam: Physical Exam  Nursing note and vitals reviewed. Psychiatric: His affect is angry, labile and inappropriate. His speech is rapid and/or pressured. He is agitated. Thought content is paranoid and delusional. Cognition and memory are impaired. He expresses impulsivity.    Review of Systems  Neurological: Negative.   Psychiatric/Behavioral: Positive for hallucinations.  All other systems reviewed and are negative.   Blood pressure 112/74, pulse 75, temperature 97.6 F (36.4 C), temperature source Oral, resp. rate 18, height 6\' 2"  (1.88 m), weight 83.5 kg (184 lb), SpO2 100 %.Body mass index is 23.62 kg/m.  General Appearance: Casual  Eye Contact:  Good  Speech:  Pressured  Volume:  Increased  Mood:  Angry, Dysphoric and Irritable  Affect:  Congruent  Thought Process:  Irrelevant and Descriptions of Associations: Tangential  Orientation:  Full (Time, Place, and Person)  Thought Content:  Illogical, Delusions and Paranoid Ideation  Suicidal Thoughts:  No  Homicidal Thoughts:  No  Memory:  Immediate;   Fair Recent;   Fair Remote;    Fair  Judgement:  Poor  Insight:  Lacking  Psychomotor Activity:  Increased  Concentration:  Concentration: Fair and Attention Span: Fair  Recall:  F[employer]ervFair  Fund of Knowledge:  Fair  Language:  Fair  Akathisia:  No  Handed:  Right  AIMS (if indicated):     Assets:  Communication Skills Desire for Improvement Financial Resources/Insurance Housing Physical Health Resilience Social Support  ADL's:  Intact  Cognition:  WNL  Sleep:  Number of Hours: 6.75     Treatment Plan Summary: Daily contact with patient to assess and evaluate symptoms and progress in treatment and Medication management   Mr. Orndoff is a 22 year old male with a history of schizophrenia admitted for psychotic break in the context of treatmentnoncompliance.He has been compliant with treatment while in the hospital. We were able to titrate his dose to 300 mg nightly. He tolerated it well.He however started displaying unstable mood with irritable and threatening behavior. Depakote added to his regimen.   #Agitation, resolved -Zyprexa 15 mg BID PRN or Zyprexa 10 mg BID PRN  #Mood/Psychosis, still delusional and grandiose -continueClozaril 300 mg nightly for psychosis, Clo+NClo level633 on 7/3 -continue Depakote 500 mg TID, VPA and ammonia level in AM -trazodone 100 mg po nightly for insomnia PRN -Vistaril 50 mg PRN  #Tachycardia and HTN,VS are stable -continueMetoprolol12.5 mg BID  #Metabolic syndrome prevention -start Metformin 500 mg daily  #Labs -lipid panel, TSH and A1Care unremerkable -EKGreviewed, NSR with QTc 374  #Social -family meeting completed, patient still not stable for discharge -mother prefers [facility] over [facility] peer support -patient has limited insight into his problems and has not been able to accept diagnosis of schizophrenia -he refuses to apply for disability -wepetitioned courtfor 90 days of involuntary outpatient psychiatric commitment to  [facility]  #Disposition -discharge with family  -follow up TBD     Kristine Linea, MD 01/26/2018, 11:34 AM

## 2018-01-25 NOTE — Progress Notes (Signed)
It was brought to this writers attention that patient has been following a male peer around the unit making her feel uncomfortable. This Clinical research associatewriter spoke with patient at length regarding his behavior and he stated, "I didn't mean any harm, I was just trying to be friends with her. She's around my age and I thought that we could relate. I won't follow her around anymore." This information will be reported to the oncoming shift and will continue to monitor the unit for a safe environment.

## 2018-01-25 NOTE — Plan of Care (Signed)
Patient in bed this morning upon my arrival to the unit. Did not get up for breakfast this morning, states, "I'm not hungry, just sleepy."  Compliant with medications this morning, affect pleasant. Verbally denies SI/HI/AVH and pain at this time. Milieu remains safe with q 15 minute checks. Will continue to monitor.

## 2018-01-26 NOTE — Plan of Care (Signed)
Patient is  anticipating his discharge from here remain calm and compliant with regimen and ADLs , affect is bright , no issued , socializing well in the unit, no distress, 15 minut rounding in progress. Problem: Education: Goal: Knowledge of [facility] Education information/materials will improve Outcome: Progressing Goal: Emotional status will improve Outcome: Progressing Goal: Mental status will improve Outcome: Progressing Goal: Verbalization of understanding the information provided will improve Outcome: Progressing   Problem: Coping: Goal: Ability to verbalize frustrations and anger appropriately will improve Outcome: Progressing Goal: Ability to demonstrate self-control will improve Outcome: Progressing   Problem: Health Behavior/Discharge Planning: Goal: Identification of resources available to assist in meeting health care needs will improve Outcome: Progressing Goal: Compliance with treatment plan for underlying cause of condition will improve Outcome: Progressing   Problem: Elimination: Goal: Will not experience complications related to bowel motility Outcome: Progressing   Problem: Activity: Goal: Will verbalize the importance of balancing activity with adequate rest periods Outcome: Progressing   Problem: Coping: Goal: Coping ability will improve Outcome: Progressing Goal: Will verbalize feelings Outcome: Progressing

## 2018-01-26 NOTE — Progress Notes (Signed)
[facility] MD Progress Note  01/27/2018 2:00 PM [name] [name]  MRN:  [id number]  Subjective:   [name] [name] seems more collected today but still easily irritable. He is asking for a "shuttle" to his mother's place and believes that she is ready to accept him. He does not remember that family meeting on Friday went badly and security guard had to remove the patient from the meeting. He has not spoken to his mother since. He now wants to be discharged to his cousin's place.   This patient requires a solid discharge plan as he is on Clozapine and has no resources other than Medicaid. [facility] team that was involved with the patient in the past refused to accept him citing poor compliance and lack of insight into severity of his problems in both, the patient and his mother.This is true indeed. The mother has very unrealistic expectation about his future and has not been able to accept his prognosis.  Principal Problem: Schizoaffective disorder, bipolar type (HCC) Diagnosis:   Patient Active Problem List   Diagnosis Date Noted  . Schizoaffective disorder, bipolar type (HCC) [F25.0] 12/30/2017    Priority: High  . Noncompliance [Z91.19] 12/30/2017  . Aggressive behavior of adolescent [R46.89] 10/24/2015   Total Time spent with patient: 20 minutes  Past Psychiatric History: schizophrenia  Past Medical History:  Past Medical History:  Diagnosis Date  . Asthma   . Depression   . Psychosis [facility])     Past Surgical History:  Procedure Laterality Date  . BACK SURGERY     Family History: History reviewed. No pertinent family history. Family Psychiatric  History: none Social History:  Social History   Substance and Sexual Activity  Alcohol Use No     Social History   Substance and Sexual Activity  Drug Use Yes  . Types: Marijuana    Social History   Socioeconomic History  . Marital status: Single    Spouse name: Not on file  . Number of children: Not on file  . Years of education: Not  on file  . Highest education level: Not on file  Occupational History  . Not on file  Social Needs  . Financial resource strain: Not on file  . Food insecurity:    Worry: Not on file    Inability: Not on file  . Transportation needs:    Medical: Not on file    Non-medical: Not on file  Tobacco Use  . Smoking status: Never Smoker  . Smokeless tobacco: Never Used  Substance and Sexual Activity  . Alcohol use: No  . Drug use: Yes    Types: Marijuana  . Sexual activity: Not Currently  Lifestyle  . Physical activity:    Days per week: Not on file    Minutes per session: Not on file  . Stress: Not on file  Relationships  . Social connections:    Talks on phone: Not on file    Gets together: Not on file    Attends religious service: Not on file    Active member of club or organization: Not on file    Attends meetings of clubs or organizations: Not on file    Relationship status: Not on file  Other Topics Concern  . Not on file  Social History Narrative  . Not on file   Additional Social History:  Sleep: Fair  Appetite:  Fair  Current Medications: Current Facility-Administered Medications  Medication Dose Route Frequency Provider Last Rate Last Dose  . acetaminophen (TYLENOL) tablet 650 mg  650 mg Oral Q6H PRN [name] [name], [name] T, MD      . alum & mag hydroxide-simeth (MAALOX/MYLANTA) 200-200-20 MG/5ML suspension 30 mL  30 mL Oral Q4H PRN [name] [name], [name] T, MD      . cloZAPine (CLOZARIL) tablet 300 mg  300 mg Oral QHS [name] [name] B, MD   300 mg at 01/26/18 2105  . divalproex (DEPAKOTE) DR tablet 500 mg  500 mg Oral Q8H [name] [name] B, MD   500 mg at 01/27/18 0642  . hydrOXYzine (ATARAX/VISTARIL) tablet 50 mg  50 mg Oral TID PRN [name] [name], [name], MD   50 mg at 01/23/18 2121  . magnesium hydroxide (MILK OF MAGNESIA) suspension 30 mL  30 mL Oral Daily PRN [name] [name], [name] T, MD   30 mL at 01/15/18 1308  . metFORMIN (GLUCOPHAGE) tablet  500 mg  500 mg Oral Q breakfast [name] [name] B, MD   500 mg at 01/27/18 0847  . metoprolol tartrate (LOPRESSOR) tablet 12.5 mg  12.5 mg Oral BID [name] [name] B, MD   12.5 mg at 01/27/18 0847  . OLANZapine (ZYPREXA) tablet 10 mg  10 mg Oral BID PRN [name] [name] B, MD   10 mg at 01/23/18 2121   Or  . OLANZapine (ZYPREXA) injection 10 mg  10 mg Intramuscular BID PRN [name] [name] B, MD      . traZODone (DESYREL) tablet 100 mg  100 mg Oral QHS PRN [name] [name] B, MD   100 mg at 01/25/18 2120    Lab Results: No results found for this or any previous visit (from the past 48 hour(s)).  Blood Alcohol level:  Lab Results  Component Value Date   ETH <10 12/30/2017   ETH <5 09/29/2016    Metabolic Disorder Labs: Lab Results  Component Value Date   HGBA1C 4.6 (L) 12/30/2017   MPG 85.32 12/30/2017   No results found for: PROLACTIN Lab Results  Component Value Date   CHOL 185 12/30/2017   TRIG 125 12/30/2017   HDL 63 12/30/2017   CHOLHDL 2.9 12/30/2017   VLDL 25 12/30/2017   LDLCALC 97 12/30/2017    Physical Findings: AIMS:  , ,  ,  ,    CIWA:    COWS:     Musculoskeletal: Strength & Muscle Tone: within normal limits Gait & Station: normal Patient leans: N/A  Psychiatric Specialty Exam: Physical Exam  Nursing note and vitals reviewed. Psychiatric: His speech is normal. His affect is angry and inappropriate. He is hyperactive. Thought content is paranoid and delusional. Cognition and memory are impaired. He expresses impulsivity.    Review of Systems  Neurological: Negative.   Psychiatric/Behavioral: Positive for hallucinations.  All other systems reviewed and are negative.   Blood pressure (!) 107/56, pulse 97, temperature (!) 97.5 F (36.4 C), temperature source Oral, resp. rate 16, height 6\' 2"  (1.88 m), weight 83.5 kg (184 lb), SpO2 100 %.Body mass index is 23.62 kg/m.  General Appearance: Casual  Eye Contact:  Good  Speech:  Clear and  Coherent  Volume:  Normal  Mood:  Irritable  Affect:  Congruent  Thought Process:  Irrelevant and Descriptions of Associations: Tangential  Orientation:  Full (Time, Place, and Person)  Thought Content:  Delusions and Paranoid Ideation  Suicidal Thoughts:  No  Homicidal Thoughts:  No  Memory:  Immediate;   Poor Recent;   Poor Remote;   Poor  Judgement:  Poor  Insight:  Lacking  Psychomotor Activity:  Normal  Concentration:  Concentration: Poor and Attention Span: Poor  Recall:  Poor  Fund of Knowledge:  Fair  Language:  Fair  Akathisia:  No  Handed:  Right  AIMS (if indicated):     Assets:  Communication Skills Desire for Improvement Financial Resources/Insurance Housing Physical Health Resilience Social Support  ADL's:  Intact  Cognition:  WNL  Sleep:  Number of Hours: 7     Treatment Plan Summary: Daily contact with patient to assess and evaluate symptoms and progress in treatment and Medication management   Mr. Whyte is a 22 year old male with a history of schizophrenia admitted for psychotic break in the context of treatmentnoncompliance.He has been compliant with treatment while in the hospital. We were able to titrate his dose to 300 mg nightly. He tolerated it well.He however started displaying unstable mood with irritable and threatening behavior. Depakote added to his regimen.   #Agitation, resolved -Zyprexa 15 mg BID PRN or Zyprexa 10 mg BID PRN  #Mood/Psychosis,still delusional and grandiose -continueClozaril 300 mg nightly for psychosis, Clo+NClo level633 on 7/3 -continue Depakote 500 mg TID, VPA and ammonia level in AM -trazodone 100 mg po nightly for insomniaPRN -Vistaril 50 mg PRN  #Tachycardia and HTN,VS are stable -continueMetoprolol12.5 mg BID  #Metabolic syndrome prevention -start Metformin 500 mg daily  #Labs -lipid panel, TSH and A1Care unremerkable -EKGreviewed, NSR with QTc 374  #Social -family meeting completed,  patient still not stable for discharge -mother prefers [facility] over [facility] peer support but [facility] refused to accept the patient -patient and his mother have limited insight into his problems and have not been able to accept consequences of diagnosis of schizophrenia -they refuses to apply for disability or guardianship -wewill petitioned courtfor 90 days of involuntary outpatient psychiatric commitment to [facility] upon discharge  #Disposition -discharge with family -follow up with [facility]    Kristine Linea, MD 01/27/2018, 2:00 PM

## 2018-01-26 NOTE — Plan of Care (Signed)
Patient seems to be digressing in regards to his emotional status. In comparison to yesterday, patient is not smiling and interacting with his peers. Patient is pacing the unit and his affect does not brighten even when spoken to. Compliant with his medications and eats his meals. Did not attend morning group today. Milieu remains quiet and safe with q 15 minute checks

## 2018-01-26 NOTE — [facility] Group Notes (Signed)
[facility] Group Notes:  (Nursing/MHT/Case Management/Adjunct)  Date:  01/26/2018  Time:  9:07 PM  Type of Therapy:  Group Therapy  Participation Level:  Active  Participation Quality:  Appropriate  Affect:  Appropriate  Cognitive:  Appropriate  Insight:  Appropriate  Engagement in Group:  Engaged  Modes of Intervention:  Discussion  Summary of Progress/Problems:  [name] EkJanice [name] [name] [name] 01/26/2018, 9:07 PM

## 2018-01-26 NOTE — Progress Notes (Signed)
Recreation Therapy Notes  Date: 01/26/2018  Time: 9:30 am   Location: Craft Room   Behavioral response: N/A   Intervention Topic:  Problem Solving  Discussion/Intervention: Patient did not attend group.   Clinical Observations/Feedback:  Patient did not attend group.   Terri Rorrer LRT/CTRS        Gaines Cartmell 01/26/2018 10:19 AM

## 2018-01-27 NOTE — [facility] Group Notes (Signed)
CSW Group Therapy Note  01/27/2018  Time:  0900  Type of Therapy and Topic: Group Therapy: Goals Group: SMART Goals    Participation Level:  Did Not Attend    Description of Group:   The purpose of a daily goals group is to assist and guide patients in setting recovery/wellness-related goals. The objective is to set goals as they relate to the crisis in which they were admitted. Patients will be using SMART goal modalities to set measurable goals. Characteristics of realistic goals will be discussed and patients will be assisted in setting and processing how one will reach their goal. Facilitator will also assist patients in applying interventions and coping skills learned in psycho-education groups to the SMART goal and process how one will achieve defined goal.    Therapeutic Goals:  -Patients will develop and document one goal related to or their crisis in which brought them into treatment.  -Patients will be guided by LCSW using SMART goal setting modality in how to set a measurable, attainable, realistic and time sensitive goal.  -Patients will process barriers in reaching goal.  -Patients will process interventions in how to overcome and successful in reaching goal.    Patient's Goal:  Pt was invited to attend group but chose not to attend. CSW will continue to encourage pt to attend group throughout their admission.   Therapeutic Modalities:  Motivational Interviewing  Cognitive Behavioral Therapy  Crisis Intervention Model  SMART goals setting  Fernande Treiber, MSW, LCSW Clinical Social Worker 01/27/2018 9:57 AM   

## 2018-01-27 NOTE — Progress Notes (Signed)
D:Pt denies SI/HI/AVH. Pt is pleasant and cooperative. Pt. Has no Complaints.  Patient Interaction appropriate. Pt. Attended snacks. Pt. Reports he is eager to hopefully visit with family tomorrow. Pt. Reports eating and sleeping good.  A: Q x 15 minute observation checks were completed for safety. Patient was provided with education. Patient was given scheduled medications. Patient  was encourage to attend groups, participate in unit activities and continue with plan of care. Pt. Plans of care and chart reviewed. Pt. Given support and encouragement.    R:Patient is complaint with medication and unit procedures. Pt. Went to groups.            Precautionary checks every 15 minutes for safety maintained, room free of safety hazards, patient sustains no injury or falls during this shift.

## 2018-01-27 NOTE — Progress Notes (Signed)
Recreation Therapy Notes  Date: 01/27/2018  Time: 9:30 am   Location: Craft Room   Behavioral response: N/A   Intervention Topic:  Self-Esteem  Discussion/Intervention: Patient did not attend group.   Clinical Observations/Feedback:  Patient did not attend group.   Shakerra Red LRT/CTRS         Brandyn Thien 01/27/2018 10:29 AM

## 2018-01-27 NOTE — [facility] Group Notes (Signed)
[facility] Group Notes:  (Nursing/MHT/Case Management/Adjunct)  Date:  01/27/2018  Time:  9:43 PM  Type of Therapy:  Group Therapy  Participation Level:  Active  Participation Quality:  Appropriate  Affect:  Flat  Cognitive:  Appropriate  Insight:  Appropriate  Engagement in Group:  Engaged  Modes of Intervention:  Discussion  Summary of Progress/Problems: MHT reviewed rules and expectations of the unit. MHT reminded patients of visiting and phone hours. MHT asked that all patients cover themselves throughout the night because there would be routine checks. MHT informed patients of the call at [facility]6am for vitals. MHT reminded patients to complete their daily checklist forms. MHT made introduction and modeled how to introduce and inform group of daily goal. MHT reviewed topic from sessions on this day (self-esteem). MHT processed with group about letting go of the past and focusing on a better future. MHT reminded patients they could not change the past, only learn from it. MHT processed with patients about self-forgiveness. MHT encouraged patients to not worry about the things they have no control over and focus on the things they can change. MHT informed patients they could not do the same things and expect a different outcome. MHT encouraged patients to focus on the present and make changes that will move them in a positive direction. [name] [name] stated his goal was to have a good day. [name] [name] stated he was able to accomplish his goal.  Jinger NeighborsKeith D Kirstine Jacquin 01/27/2018, 9:43 PM

## 2018-01-27 NOTE — [facility] Group Notes (Signed)
01/27/2018 1PM  Type of Therapy/Topic:  Group Therapy:  Feelings about Diagnosis  Participation Level:  Did Not Attend   Description of Group:   This group will allow patients to explore their thoughts and feelings about diagnoses they have received. Patients will be guided to explore their level of understanding and acceptance of these diagnoses. Facilitator will encourage patients to process their thoughts and feelings about the reactions of others to their diagnosis and will guide patients in identifying ways to discuss their diagnosis with significant others in their lives. This group will be process-oriented, with patients participating in exploration of their own experiences, giving and receiving support, and processing challenge from other group members.   Therapeutic Goals: 1. Patient will demonstrate understanding of diagnosis as evidenced by identifying two or more symptoms of the disorder 2. Patient will be able to express two feelings regarding the diagnosis 3. Patient will demonstrate their ability to communicate their needs through discussion and/or role play  Summary of Patient Progress: Patient was encouraged and invited to attend group. Patient did not attend group. Social worker will continue to encourage group participation in the future.        Therapeutic Modalities:   Cognitive Behavioral Therapy Brief Therapy Feelings Identification    [name] ShearsCassandra  Abigaile Rossie, LCSW 01/27/2018 2:46 PM

## 2018-01-27 NOTE — Plan of Care (Signed)
Pt. Given education and verbalizes understanding. Pt. Compliant with medications per plans of care. Pt. Denies SI/HI and reports he can remain safe while on the unit. Pt. Verbally contracts for safety.    Problem: Education: Goal: Knowledge of [facility] Education information/materials will improve Outcome: Progressing   Problem: Health Behavior/Discharge Planning: Goal: Compliance with treatment plan for underlying cause of condition will improve Outcome: Progressing   Problem: Health Behavior/Discharge Planning: Goal: Ability to remain free from injury will improve Outcome: Progressing

## 2018-01-27 NOTE — Plan of Care (Signed)
Patient is alert and oriented X 4, denies SI, HI and AVH. Patient is flat but pleasant. Patient is compliant with medications, able to verbalize feelings to nursing staff, eating meals and bathing. Patient only complains of intermediate anxiety, but states he is doing well. Nurse will continue to monitor. Problem: Education: Goal: Knowledge of [facility] Education information/materials will improve Outcome: Progressing Goal: Emotional status will improve Outcome: Progressing Goal: Mental status will improve Outcome: Progressing Goal: Verbalization of understanding the information provided will improve Outcome: Progressing   Problem: Coping: Goal: Ability to verbalize frustrations and anger appropriately will improve Outcome: Progressing Goal: Ability to demonstrate self-control will improve Outcome: Progressing   Problem: Health Behavior/Discharge Planning: Goal: Identification of resources available to assist in meeting health care needs will improve Outcome: Progressing Goal: Compliance with treatment plan for underlying cause of condition will improve Outcome: Progressing

## 2018-01-28 LAB — CBC WITH DIFFERENTIAL/PLATELET
Basophils Absolute: 0 10*3/uL (ref 0–0.1)
Basophils Relative: 0 %
EOS PCT: 0 %
Eosinophils Absolute: 0 10*3/uL (ref 0–0.7)
HEMATOCRIT: 45.7 % (ref 40.0–52.0)
Hemoglobin: 15.8 g/dL (ref 13.0–18.0)
LYMPHS PCT: 29 %
Lymphs Abs: 1.6 10*3/uL (ref 1.0–3.6)
MCH: 28.7 pg (ref 26.0–34.0)
MCHC: 34.5 g/dL (ref 32.0–36.0)
MCV: 83.3 fL (ref 80.0–100.0)
MONO ABS: 0.7 10*3/uL (ref 0.2–1.0)
MONOS PCT: 12 %
NEUTROS ABS: 3.2 10*3/uL (ref 1.4–6.5)
Neutrophils Relative %: 59 %
Platelets: 259 10*3/uL (ref 150–440)
RBC: 5.49 MIL/uL (ref 4.40–5.90)
RDW: 13.3 % (ref 11.5–14.5)
WBC: 5.5 10*3/uL (ref 3.8–10.6)

## 2018-01-28 LAB — VALPROIC ACID LEVEL: Valproic Acid Lvl: 109 ug/mL — ABNORMAL HIGH (ref 50.0–100.0)

## 2018-01-28 LAB — AMMONIA: Ammonia: 67 umol/L — ABNORMAL HIGH (ref 9–35)

## 2018-01-28 MED ORDER — DIVALPROEX SODIUM 500 MG PO DR TAB
500.0000 mg | DELAYED_RELEASE_TABLET | Freq: Two times a day (BID) | ORAL | Status: DC
  Administered 2018-01-29 (×2): 500 mg via ORAL
  Filled 2018-01-28 (×2): qty 1

## 2018-01-28 NOTE — Progress Notes (Signed)
[facility] MD Progress Note  01/28/2018 1:16 PM [name] [name]  MRN:  [id number]  Subjective:    [name] [name] is much more pleasant and polite today. He came to my office to discuss discharge plans and had some new ideas. He has not been able to reach his mother. She is not taking his calls after their falling out on Friday. He considers discharge with his cousin in the area, his church friend [name] or even staying at [name]. He is not a substance user and does not meet their criteria.  He tolerates medications well. VPA level today 109, ammonia elevated at 67.  Principal Problem: Schizoaffective disorder, bipolar type (HCC) Diagnosis:   Patient Active Problem List   Diagnosis Date Noted  . Schizoaffective disorder, bipolar type (HCC) [F25.0] 12/30/2017    Priority: High  . Noncompliance [Z91.19] 12/30/2017  . Aggressive behavior of adolescent [R46.89] 10/24/2015   Total Time spent with patient: 20 minutes  Past Psychiatric History: schizophrenia  Past Medical History:  Past Medical History:  Diagnosis Date  . Asthma   . Depression   . Psychosis [facility])     Past Surgical History:  Procedure Laterality Date  . BACK SURGERY     Family History: History reviewed. No pertinent family history. Family Psychiatric  History: none Social History:  Social History   Substance and Sexual Activity  Alcohol Use No     Social History   Substance and Sexual Activity  Drug Use Yes  . Types: Marijuana    Social History   Socioeconomic History  . Marital status: Single    Spouse name: Not on file  . Number of children: Not on file  . Years of education: Not on file  . Highest education level: Not on file  Occupational History  . Not on file  Social Needs  . Financial resource strain: Not on file  . Food insecurity:    Worry: Not on file    Inability: Not on file  . Transportation needs:    Medical: Not on file    Non-medical: Not on file  Tobacco  Use  . Smoking status: Never Smoker  . Smokeless tobacco: Never Used  Substance and Sexual Activity  . Alcohol use: No  . Drug use: Yes    Types: Marijuana  . Sexual activity: Not Currently  Lifestyle  . Physical activity:    Days per week: Not on file    Minutes per session: Not on file  . Stress: Not on file  Relationships  . Social connections:    Talks on phone: Not on file    Gets together: Not on file    Attends religious service: Not on file    Active member of club or organization: Not on file    Attends meetings of clubs or organizations: Not on file    Relationship status: Not on file  Other Topics Concern  . Not on file  Social History Narrative  . Not on file   Additional Social History:                         Sleep: Fair  Appetite:  Fair  Current Medications: Current Facility-Administered Medications  Medication Dose Route Frequency Provider Last Rate Last Dose  . acetaminophen (TYLENOL) tablet 650 mg  650 mg Oral Q6H PRN [name], [name], MD      . alum & mag hydroxide-simeth (MAALOX/MYLANTA) 200-200-20 MG/5ML suspension 30  mL  30 mL Oral Q4H PRN [name], [name], MD      . cloZAPine (CLOZARIL) tablet 300 mg  300 mg Oral QHS Kamar Callender B, MD   300 mg at 01/27/18 2139  . [START ON 01/29/2018] divalproex (DEPAKOTE) DR tablet 500 mg  500 mg Oral Q12H Sheneika Walstad B, MD      . hydrOXYzine (ATARAX/VISTARIL) tablet 50 mg  50 mg Oral TID PRN [name], [name], MD   50 mg at 01/27/18 2139  . magnesium hydroxide (MILK OF MAGNESIA) suspension 30 mL  30 mL Oral Daily PRN [name], [name], MD   30 mL at 01/15/18 1308  . metFORMIN (GLUCOPHAGE) tablet 500 mg  500 mg Oral Q breakfast Kishawn Pickar B, MD   500 mg at 01/28/18 0843  . metoprolol tartrate (LOPRESSOR) tablet 12.5 mg  12.5 mg Oral BID Nitza Schmid B, MD   12.5 mg at 01/28/18 0843  . OLANZapine (ZYPREXA) tablet 10 mg  10 mg Oral BID PRN Rayce Brahmbhatt B, MD   10 mg at  01/23/18 2121   Or  . OLANZapine (ZYPREXA) injection 10 mg  10 mg Intramuscular BID PRN Otto Felkins B, MD      . traZODone (DESYREL) tablet 100 mg  100 mg Oral QHS PRN Denay Pleitez B, MD   100 mg at 01/27/18 2139    Lab Results:  Results for orders placed or performed during the hospital encounter of 12/30/17 (from the past 48 hour(s))  Valproic acid level     Status: Abnormal   Collection Time: 01/28/18  7:24 AM  Result Value Ref Range   Valproic Acid Lvl 109 (H) 50.0 - 100.0 ug/mL    Comment: Performed at [facility], [address] [place], [place] [address]  Ammonia     Status: Abnormal   Collection Time: 01/28/18  7:24 AM  Result Value Ref Range   Ammonia 67 (H) 9 - 35 umol/L    Comment: Performed at [facility], [address] [place], [place] [address]  CBC with Differential/Platelet     Status: None   Collection Time: 01/28/18  7:24 AM  Result Value Ref Range   WBC 5.5 3.8 - 10.6 K/uL   RBC 5.49 4.40 - 5.90 MIL/uL   Hemoglobin 15.8 13.0 - 18.0 g/dL   HCT 19.145.7 47.840.0 - 29.552.0 %   MCV 83.3 80.0 - 100.0 fL   MCH 28.7 26.0 - 34.0 pg   MCHC 34.5 32.0 - 36.0 g/dL   RDW 62.113.3 30.811.5 - 65.714.5 %   Platelets 259 150 - 440 K/uL   Neutrophils Relative % 59 %   Neutro Abs 3.2 1.4 - 6.5 K/uL   Lymphocytes Relative 29 %   Lymphs Abs 1.6 1.0 - 3.6 K/uL   Monocytes Relative 12 %   Monocytes Absolute 0.7 0.2 - 1.0 K/uL   Eosinophils Relative 0 %   Eosinophils Absolute 0.0 0 - 0.7 K/uL   Basophils Relative 0 %   Basophils Absolute 0.0 0 - 0.1 K/uL    Comment: Performed at [facility], [address] [place], [place] [address]    Blood Alcohol level:  Lab Results  Component Value Date   [facility] 12/30/2017   ETH <5 09/29/2016    Metabolic Disorder Labs: Lab Results  Component Value Date   HGBA1C 4.6 (L) 12/30/2017   MPG 85.32 12/30/2017   No results found for: PROLACTIN Lab Results  Component Value Date  CHOL 185 12/30/2017    TRIG 125 12/30/2017   HDL 63 12/30/2017   CHOLHDL 2.9 12/30/2017   VLDL 25 12/30/2017   LDLCALC 97 12/30/2017    Physical Findings: AIMS:  , ,  ,  ,    CIWA:    COWS:     Musculoskeletal: Strength & Muscle Tone: within normal limits Gait & Station: normal Patient leans: N/A  Psychiatric Specialty Exam: Physical Exam  Nursing note and vitals reviewed. Psychiatric: His speech is normal and behavior is normal. Judgment normal. His affect is blunt. Thought content is delusional. Cognition and memory are normal.    Review of Systems  Neurological: Negative.   Psychiatric/Behavioral: Negative.   All other systems reviewed and are negative.   Blood pressure 117/74, pulse 79, temperature 98.1 F (36.7 C), temperature source Oral, resp. rate 18, height 6\' 2"  (1.88 m), weight 83.5 kg (184 lb), SpO2 100 %.Body mass index is 23.62 kg/m.  General Appearance: Casual  Eye Contact:  Good  Speech:  Clear and Coherent  Volume:  Normal  Mood:  Euthymic  Affect:  Appropriate  Thought Process:  Goal Directed and Descriptions of Associations: Intact  Orientation:  Full (Time, Place, and Person)  Thought Content:  WDL  Suicidal Thoughts:  No  Homicidal Thoughts:  No  Memory:  Immediate;   Fair Recent;   Fair Remote;   Fair  Judgement:  Poor  Insight:  Shallow  Psychomotor Activity:  Normal  Concentration:  Concentration: Fair and Attention Span: Fair  Recall:  [employer] of Knowledge:  Fair  Language:  Fair  Akathisia:  No  Handed:  Right  AIMS (if indicated):     Assets:  Communication Skills Desire for Improvement Financial Resources/[name] Housing Physical Health Resilience Social Support  ADL's:  Intact  Cognition:  WNL  Sleep:  Number of Hours: 7.5     Treatment Plan Summary: Daily contact with patient to assess and evaluate symptoms and progress in treatment and Medication management   Mr. Gift is a 22 year old male with a history of schizophrenia admitted  for psychotic break in the context of treatmentnoncompliance.He has been compliant with treatment while in the hospital. We were able to titrate his dose to 300 mg nightly. He tolerated it well.He however started displaying unstable mood with irritable and threatening behavior that resolved with Depakote.    #Agitation, resolved  #Mood/Psychosis,improved -continueClozaril 300 mg nightly for psychosis, Clo+NClo level633 on 7/3 -lower Depakote to 500 mg BID, VPA level 109 and ammonia 67 -trazodone 100 mg po nightly for insomniaPRN -Vistaril50 mg PRN  #Tachycardia and HTN,VS are stable -continueMetoprolol12.5 mg BID  #Metabolic syndrome prevention -start Metformin 500 mg daily  #Labs -lipid panel, TSH and A1Care unremerkable -EKGreviewed, NSR with QTc 374  #Social -family meeting completed, patientstillnot stable for discharge -mother prefers [facility] team over [facility] peer support but [facility] refused to accept the patient -patient and his mother have limited insight into his problems and have not been able to accept consequences of diagnosis of schizophrenia -they refuses to apply for disability or guardianship -wewill petitioned courtfor 90 days of involuntary outpatient psychiatric commitment to [facility] upon discharge  #Disposition -discharge with family -follow upwith [facility]      Kristine Linea, MD 01/28/2018, 1:16 PM

## 2018-01-28 NOTE — Progress Notes (Signed)
Clozapine monitoring Consult   22 yo male current order for clozapine 300 mg PO at bedtime.  New restart.  Patient had been off meds for a while before admission  06/19  ANC 1800 06/26  ANC 1600 07/03  ANC 2400 07/10  ANC 3200  Information entered into Clozapine registry and pt eligible to receive clozapine Next labs due in a week - ordered for 07/17  Pharmacy will continue to follow.   Stormy CardKatsoudas,Keanon Bevins K, [facility] 01/28/2018 2:17 PM

## 2018-01-28 NOTE — Progress Notes (Signed)
D- Patient alert and oriented. Patient presents in a pleasant mood on assessment without voicing any complaints/concerns to this Clinical research associatewriter. Patient denies SI, HI, AVH, and pain at this time. Patient also denies any depression/anxiety stating to this writer that overall he feels "good". Patient's goal for today is "to have a good day".  A- Scheduled medications administered to patient, per MD orders. Support and encouragement provided.  Routine safety checks conducted every 15 minutes.  Patient informed to notify staff with problems or concerns.  R- No adverse drug reactions noted. Patient contracts for safety at this time. Patient compliant with medications and treatment plan. Patient receptive, calm, and cooperative. Patient interacts well with others on the unit.  Patient remains safe at this time.

## 2018-01-28 NOTE — Plan of Care (Signed)
Patient verbalizes understanding of the general information that's been provided to him and all questions/concerns have been addressed and answered at this time. Patient denies SI/HI/AVH as well as any signs/symptoms of depression/anxiety at this time stating to this writer that overall he is feeling "good". Patient has the ability to verbalize frustrations in an appropriate manner. Patient demonstrates self-control on the unit. Patient has the ability to identify the available resources that can assist him in meeting his health-care needs. Patient has been in compliance with his treatment plan and has not experienced any health-related complications thus far. Patient has been free from injury thus far and remains safe on the unit.   Problem: Education: Goal: Knowledge of [facility] Education information/materials will improve Outcome: Progressing Goal: Emotional status will improve Outcome: Progressing Goal: Mental status will improve Outcome: Progressing Goal: Verbalization of understanding the information provided will improve Outcome: Progressing   Problem: Coping: Goal: Ability to verbalize frustrations and anger appropriately will improve Outcome: Progressing Goal: Ability to demonstrate self-control will improve Outcome: Progressing   Problem: Health Behavior/Discharge Planning: Goal: Identification of resources available to assist in meeting health care needs will improve Outcome: Progressing Goal: Compliance with treatment plan for underlying cause of condition will improve Outcome: Progressing   Problem: Elimination: Goal: Will not experience complications related to bowel motility Outcome: Progressing   Problem: Activity: Goal: Will verbalize the importance of balancing activity with adequate rest periods Outcome: Progressing   Problem: Coping: Goal: Coping ability will improve Outcome: Progressing Goal: Will verbalize feelings Outcome: Progressing   Problem:  Health Behavior/Discharge Planning: Goal: Ability to remain free from injury will improve Outcome: Progressing

## 2018-01-28 NOTE — Tx Team (Signed)
Interdisciplinary Treatment and Diagnostic Plan Update  01/28/2018 Time of Session: 10:30am [name] [name] MRN: [id number]  Principal Diagnosis: Schizoaffective disorder, bipolar type (HCC)  Secondary Diagnoses: Principal Problem:   Schizoaffective disorder, bipolar type (HCC) Active Problems:   Noncompliance   Current Medications:  Current Facility-Administered Medications  Medication Dose Route Frequency Provider Last Rate Last Dose  . acetaminophen (TYLENOL) tablet 650 mg  650 mg Oral Q6H PRN [name], [name], MD      . alum & mag hydroxide-simeth (MAALOX/MYLANTA) 200-200-20 MG/5ML suspension 30 mL  30 mL Oral Q4H PRN [name], [name], MD      . cloZAPine (CLOZARIL) tablet 300 mg  300 mg Oral QHS [name], [name], MD   300 mg at 01/27/18 2139  . [START ON 01/29/2018] divalproex (DEPAKOTE) DR tablet 500 mg  500 mg Oral Q12H [name], [name], MD      . hydrOXYzine (ATARAX/VISTARIL) tablet 50 mg  50 mg Oral TID PRN [name], [name], MD   50 mg at 01/27/18 2139  . magnesium hydroxide (MILK OF MAGNESIA) suspension 30 mL  30 mL Oral Daily PRN [name], [name], MD   30 mL at 01/15/18 1308  . metFORMIN (GLUCOPHAGE) tablet 500 mg  500 mg Oral Q breakfast [name], [name], MD   500 mg at 01/28/18 0843  . metoprolol tartrate (LOPRESSOR) tablet 12.5 mg  12.5 mg Oral BID [name], [name], MD   12.5 mg at 01/28/18 0843  . traZODone (DESYREL) tablet 100 mg  100 mg Oral QHS PRN [name], [name], MD   100 mg at 01/27/18 2139   PTA Medications: Medications Prior to Admission  Medication Sig Dispense Refill Last Dose  . doxycycline (VIBRA-TABS) 100 MG tablet Take 1 tablet (100 mg total) by mouth 2 (two) times daily. (Patient not taking: Reported on 12/30/2017) 19 tablet 0 Not Taking at Unknown time  . HYDROcodone-acetaminophen (NORCO) 5-325 MG tablet Take 1 tablet by mouth every 6 (six) hours as needed. (Patient not taking: Reported on 12/30/2017) 10 tablet 0 Not Taking at Unknown  time    Patient Stressors: Medication change or noncompliance  Patient Strengths: Capable of independent living Communication skills Physical Health Supportive family/friends  Treatment Modalities: Medication Management, Group therapy, Case management,  1 to 1 session with clinician, Psychoeducation, Recreational therapy.   Physician Treatment Plan for Primary Diagnosis: Schizoaffective disorder, bipolar type (HCC) Long Term Goal(s): Improvement in symptoms so as ready for discharge NA   Short Term Goals: Ability to identify changes in lifestyle to reduce recurrence of condition will improve Ability to verbalize feelings will improve Ability to disclose and discuss suicidal ideas Ability to demonstrate self-control will improve Ability to identify and develop effective coping behaviors will improve Ability to maintain clinical measurements within normal limits will improve Compliance with prescribed medications will improve Ability to identify triggers associated with substance abuse/mental health issues will improve NA  Medication Management: Evaluate patient's response, side effects, and tolerance of medication regimen.  Therapeutic Interventions: 1 to 1 sessions, Unit Group sessions and Medication administration.  Evaluation of Outcomes: Progressing  Physician Treatment Plan for Secondary Diagnosis: Principal Problem:   Schizoaffective disorder, bipolar type (HCC) Active Problems:   Noncompliance  Long Term Goal(s): Improvement in symptoms so as ready for discharge NA   Short Term Goals: Ability to identify changes in lifestyle to reduce recurrence of condition will improve Ability to verbalize feelings will improve Ability to disclose and discuss suicidal ideas Ability to demonstrate self-control  will improve Ability to identify and develop effective coping behaviors will improve Ability to maintain clinical measurements within normal limits will improve Compliance  with prescribed medications will improve Ability to identify triggers associated with substance abuse/mental health issues will improve NA     Medication Management: Evaluate patient's response, side effects, and tolerance of medication regimen.  Therapeutic Interventions: 1 to 1 sessions, Unit Group sessions and Medication administration.  Evaluation of Outcomes: Progressing   RN Treatment Plan for Primary Diagnosis: Schizoaffective disorder, bipolar type (HCC) Long Term Goal(s): Knowledge of disease and therapeutic regimen to maintain health will improve  Short Term Goals: Ability to verbalize feelings will improve, Ability to identify and develop effective coping behaviors will improve and Compliance with prescribed medications will improve  Medication Management: RN will administer medications as ordered by provider, will assess and evaluate patient's response and provide education to patient for prescribed medication. RN will report any adverse and/or side effects to prescribing provider.  Therapeutic Interventions: 1 on 1 counseling sessions, Psychoeducation, Medication administration, Evaluate responses to treatment, Monitor vital signs and CBGs as ordered, Perform/monitor CIWA, COWS, AIMS and Fall Risk screenings as ordered, Perform wound care treatments as ordered.  Evaluation of Outcomes: Progressing   LCSW Treatment Plan for Primary Diagnosis: Schizoaffective disorder, bipolar type (HCC) Long Term Goal(s): Safe transition to appropriate next level of care at discharge, Engage patient in therapeutic group addressing interpersonal concerns.  Short Term Goals: Engage patient in aftercare planning with referrals and resources, Increase social support, Facilitate acceptance of mental health diagnosis and concerns, Identify triggers associated with mental health/substance abuse issues and Increase skills for wellness and recovery  Therapeutic Interventions: Assess for all discharge  needs, 1 to 1 time with Social worker, Explore available resources and support systems, Assess for adequacy in community support network, Educate family and significant other(s) on suicide prevention, Complete Psychosocial Assessment, Interpersonal group therapy.  Evaluation of Outcomes: Progressing   Progress in Treatment: Attending groups: Yes. Participating in groups: Yes. Taking medication as prescribed: Yes. Toleration medication: Yes. Family/Significant other contact made: Yes, individual(s) contacted:  pt's mother, Iva Boopiman Babikir has been contacted by tx team members Patient understands diagnosis: Yes. Discussing patient identified problems/goals with staff: Yes. Medical problems stabilized or resolved: Yes. Denies suicidal/homicidal ideation: Yes. Issues/concerns per patient self-inventory: No. Other:   New problem(s) identified: No, Describe:  None  New Short Term/Long Term Goal(s): *Patient did not attend.*  Patient Goals:  *Patient did not attend.*  Discharge Plan or Barriers:   A barrier to discharge is that pt is taking Cloziril, which has to be monitored very closely. There is concern that he will not to the required follow-up to remain on this medication.  Pt's mother has assisted him in the past with medication compliance, but pt states that he wants to go live with a friend following discharge.  Pt's medication may have to be changed if he is not going to be compliant with monitoring requirements of his current medication.  Reason for Continuation of Hospitalization: Medication stabilization Other; describe Mood instability/lability  Estimated Length of Stay: 1-2 days  Recreational Therapy: Patient Stressors: N/A  Patient Goal: Patient will engage in interactions with peers and staff in pro-social manner at least 2x within 5 recreation therapy group sessions  Attendees: Patient: 01/28/2018 2:02 PM  Physician: Dr. Kristine LineaJolanta [name] MD 01/28/2018 2:02 PM   Nursing: 01/28/2018 2:02 PM  RN Care Manager: 01/28/2018 2:02 PM  Social Worker: Johny Shearsassandra Thurley Francesconi, 01/28/2018 2:02 PM  Recreational Therapist:  Hilbert Bible, LRT 01/28/2018 2:02 PM  Other: Heidi Dach, LCSW 01/28/2018 2:02 PM  Other: Huey Romans, LCSW 01/28/2018 2:02 PM  Other: Damian Leavell, Chaplin 01/28/2018 2:02 PM    Scribe for Treatment Team: [name], LCSW 01/28/2018 2:02 PM

## 2018-01-28 NOTE — [facility] Group Notes (Signed)
LCSW Group Therapy Note  01/28/2018 1:00 pm  Type of Therapy/Topic:  Group Therapy:  Emotion Regulation  Participation Level:  None   Description of Group:    The purpose of this group is to assist patients in learning to regulate negative emotions and experience positive emotions. Patients will be guided to discuss ways in which they have been vulnerable to their negative emotions. These vulnerabilities will be juxtaposed with experiences of positive emotions or situations, and patients will be challenged to use positive emotions to combat negative ones. Special emphasis will be placed on coping with negative emotions in conflict situations, and patients will process healthy conflict resolution skills.  Therapeutic Goals: 1. Patient will identify two positive emotions or experiences to reflect on in order to balance out negative emotions 2. Patient will label two or more emotions that they find the most difficult to experience 3. Patient will demonstrate positive conflict resolution skills through discussion and/or role plays  Summary of Patient Progress:  [name] [name] did not actively participate in today's group discussion on emotion regulation.  [name] [name] came into group late and left group and came back several times.  When CSW asked him a question in order to try to get him to actively participate in today's discussion he said, "I don't really have anything to say".     Therapeutic Modalities:   Cognitive Behavioral Therapy Feelings Identification Dialectical Behavioral Therapy

## 2018-01-28 NOTE — Progress Notes (Deleted)
Recreation Therapy Notes  Date: 01/28/2018  Time: 9:30 am   Location: Craft Room   Behavioral response: N/A   Intervention Topic:  Coping  Discussion/Intervention: Patient did not attend group.   Clinical Observations/Feedback:  Patient did not attend group.   Avory Rahimi LRT/CTRS         Tamee Battin 01/28/2018 11:52 AM

## 2018-01-28 NOTE — Plan of Care (Addendum)
Patient found in common area upon my arrival. Patient is visible but minimally social this evening. Attends group. Patient reports mood is anxious. Given Vistaril with positive results. Affect is congruent. Denies pain. Denies SI/HI/AVH. Reports eating and voiding adequately. Minimally verbal throughout assessment. Compliant with HS medications and staff direction. Q 15 minute checks maintained. Will continue to monitor throughout the shift. Patient slept 7.5 hours. No apparent distress. Compliant with am Depakote. Will endorse care to oncoming shift.  Problem: Education: Goal: Emotional status will improve Outcome: Progressing Goal: Mental status will improve Outcome: Progressing   Problem: Coping: Goal: Ability to verbalize frustrations and anger appropriately will improve Outcome: Progressing Goal: Ability to demonstrate self-control will improve Outcome: Progressing   Problem: Coping: Goal: Coping ability will improve Outcome: Progressing Goal: Will verbalize feelings Outcome: Progressing

## 2018-01-28 NOTE — Progress Notes (Signed)
Recreation Therapy Notes  Date: 01/28/2018  Time: 9:30 am   Location: Craft Room   Behavioral response: N/A   Intervention Topic:  Coping  Discussion/Intervention: Patient did not attend group.   Clinical Observations/Feedback:  Patient did not attend group.   Enrico Eaddy LRT/CTRS         Jillisa Harris 01/28/2018 11:51 AM

## 2018-01-28 NOTE — Progress Notes (Signed)
[facility] MD Progress Note  01/29/2018 8:05 AM Milos PLATO ALSPAUGH  MRN:  [id number]  Subjective:    Mr. Steig came to my office first thing in the morning to inform me, very respectfully, that he spoke with his mother last night and that she will come to visit him tonight. He denies any symptoms of depression, anxiety or psychosis. He takes medications and tolerates them well. He wants to follow up with [facility] and Mr. Unk Pinto. He no longer wants to be discharged with his cousin or his pastor but wants to return home with bis mother. He understands that weekly labwork is necessary to continue on Clozapine.   His mother spoke extensively with his nurse last night. See note.    Principal Problem: Schizoaffective disorder, bipolar type (HCC) Diagnosis:   Patient Active Problem List   Diagnosis Date Noted  . Schizoaffective disorder, bipolar type (HCC) [F25.0] 12/30/2017    Priority: High  . Noncompliance [Z91.19] 12/30/2017  . Aggressive behavior of adolescent [R46.89] 10/24/2015   Total Time spent with patient: 20 minutes  Past Psychiatric History: schizophrenia  Past Medical History:  Past Medical History:  Diagnosis Date  . Asthma   . Depression   . Psychosis [facility])     Past Surgical History:  Procedure Laterality Date  . BACK SURGERY     Family History: History reviewed. No pertinent family history. Family Psychiatric  History: none Social History:  Social History   Substance and Sexual Activity  Alcohol Use No     Social History   Substance and Sexual Activity  Drug Use Yes  . Types: Marijuana    Social History   Socioeconomic History  . Marital status: Single    Spouse name: Not on file  . Number of children: Not on file  . Years of education: Not on file  . Highest education level: Not on file  Occupational History  . Not on file  Social Needs  . Financial resource strain: Not on file  . Food insecurity:    Worry: Not on file    Inability: Not on file  .  Transportation needs:    Medical: Not on file    Non-medical: Not on file  Tobacco Use  . Smoking status: Never Smoker  . Smokeless tobacco: Never Used  Substance and Sexual Activity  . Alcohol use: No  . Drug use: Yes    Types: Marijuana  . Sexual activity: Not Currently  Lifestyle  . Physical activity:    Days per week: Not on file    Minutes per session: Not on file  . Stress: Not on file  Relationships  . Social connections:    Talks on phone: Not on file    Gets together: Not on file    Attends religious service: Not on file    Active member of club or organization: Not on file    Attends meetings of clubs or organizations: Not on file    Relationship status: Not on file  Other Topics Concern  . Not on file  Social History Narrative  . Not on file   Additional Social History:                         Sleep: Fair  Appetite:  Fair  Current Medications: Current Facility-Administered Medications  Medication Dose Route Frequency Provider Last Rate Last Dose  . acetaminophen (TYLENOL) tablet 650 mg  650 mg Oral Q6H PRN [name], [name],  MD      . alum & mag hydroxide-simeth (MAALOX/MYLANTA) 200-200-20 MG/5ML suspension 30 mL  30 mL Oral Q4H PRN [name], [name], MD      . cloZAPine (CLOZARIL) tablet 300 mg  300 mg Oral QHS Alex Leahy B, MD   300 mg at 01/28/18 2122  . divalproex (DEPAKOTE) DR tablet 500 mg  500 mg Oral Q12H Miquel Stacks B, MD      . hydrOXYzine (ATARAX/VISTARIL) tablet 50 mg  50 mg Oral TID PRN [name], [name], MD   50 mg at 01/27/18 2139  . magnesium hydroxide (MILK OF MAGNESIA) suspension 30 mL  30 mL Oral Daily PRN [name], [name], MD   30 mL at 01/15/18 1308  . metFORMIN (GLUCOPHAGE) tablet 500 mg  500 mg Oral Q breakfast Yulian Gosney B, MD   500 mg at 01/28/18 0843  . metoprolol tartrate (LOPRESSOR) tablet 12.5 mg  12.5 mg Oral BID Nyashia Raney B, MD   12.5 mg at 01/28/18 1702  . traZODone (DESYREL) tablet 100 mg   100 mg Oral QHS PRN Yaminah Clayborn B, MD   100 mg at 01/27/18 2139    Lab Results:  Results for orders placed or performed during the hospital encounter of 12/30/17 (from the past 48 hour(s))  Valproic acid level     Status: Abnormal   Collection Time: 01/28/18  7:24 AM  Result Value Ref Range   Valproic Acid Lvl 109 (H) 50.0 - 100.0 ug/mL    Comment: Performed at [facility], [address] [place], [place] [address]  Ammonia     Status: Abnormal   Collection Time: 01/28/18  7:24 AM  Result Value Ref Range   Ammonia 67 (H) 9 - 35 umol/L    Comment: Performed at [facility], [address] [place], [place] [address]  CBC with Differential/Platelet     Status: None   Collection Time: 01/28/18  7:24 AM  Result Value Ref Range   WBC 5.5 3.8 - 10.6 K/uL   RBC 5.49 4.40 - 5.90 MIL/uL   Hemoglobin 15.8 13.0 - 18.0 g/dL   HCT 09.845.7 11.940.0 - 14.752.0 %   MCV 83.3 80.0 - 100.0 fL   MCH 28.7 26.0 - 34.0 pg   MCHC 34.5 32.0 - 36.0 g/dL   RDW 82.913.3 56.211.5 - 13.014.5 %   Platelets 259 150 - 440 K/uL   Neutrophils Relative % 59 %   Neutro Abs 3.2 1.4 - 6.5 K/uL   Lymphocytes Relative 29 %   Lymphs Abs 1.6 1.0 - 3.6 K/uL   Monocytes Relative 12 %   Monocytes Absolute 0.7 0.2 - 1.0 K/uL   Eosinophils Relative 0 %   Eosinophils Absolute 0.0 0 - 0.7 K/uL   Basophils Relative 0 %   Basophils Absolute 0.0 0 - 0.1 K/uL    Comment: Performed at [facility], [address] [place], [place] [address]    Blood Alcohol level:  Lab Results  Component Value Date   [facility] <[facility] 12/30/2017   ETH <5 09/29/2016    Metabolic Disorder Labs: Lab Results  Component Value Date   HGBA1C 4.6 (L) 12/30/2017   MPG 85.32 12/30/2017   No results found for: PROLACTIN Lab Results  Component Value Date   CHOL 185 12/30/2017   TRIG 125 12/30/2017   HDL 63 12/30/2017   CHOLHDL 2.9 12/30/2017   VLDL 25 12/30/2017   LDLCALC 97 12/30/2017    Physical Findings: AIMS:  , ,  ,  ,  CIWA:    COWS:     Musculoskeletal: Strength & Muscle Tone: within normal limits Gait & Station: normal Patient leans: N/A  Psychiatric Specialty Exam: Physical Exam  Nursing note and vitals reviewed. Psychiatric: He has a normal mood and affect. His speech is normal and behavior is normal. Thought content is delusional. Cognition and memory are normal. He expresses impulsivity.    Review of Systems  Neurological: Negative.   Psychiatric/Behavioral: Negative.   All other systems reviewed and are negative.   Blood pressure (!) 122/99, pulse 94, temperature 98.1 F (36.7 C), temperature source Oral, resp. rate 18, height 6\' 2"  (1.88 m), weight 83.5 kg (184 lb), SpO2 100 %.Body mass index is 23.62 kg/m.  General Appearance: Casual  Eye Contact:  Good  Speech:  Clear and Coherent  Volume:  Normal  Mood:  Euthymic  Affect:  Blunt  Thought Process:  Goal Directed and Descriptions of Associations: Intact  Orientation:  Full (Time, Place, and Person)  Thought Content:  Delusions  Suicidal Thoughts:  No  Homicidal Thoughts:  No  Memory:  Immediate;   Fair Recent;   Fair Remote;   Fair  Judgement:  Poor  Insight:  Lacking  Psychomotor Activity:  Normal  Concentration:  Concentration: Fair and Attention Span: Fair  Recall:  [employer] of Knowledge:  Fair  Language:  Fair  Akathisia:  No  Handed:  Right  AIMS (if indicated):     Assets:  Communication Skills Desire for Improvement Financial Resources/Insurance Housing Physical Health Resilience Social Support  ADL's:  Intact  Cognition:  WNL  Sleep:  Number of Hours: 7     Treatment Plan Summary: Daily contact with patient to assess and evaluate symptoms and progress in treatment and Medication management   Mr. Avans is a 22 year old male with a history of schizophrenia admitted for psychotic break in the context of treatmentnoncompliance.He has been compliant with treatment while in the hospital. We were able  to titrate his dose to 300 mg nightly. He tolerated it well.He however started displaying unstable mood with irritable and threatening behavior that resolved with Depakote.    #Agitation, resolved  #Mood/Psychosis,improved -continueClozaril 300 mg nightly for psychosis, Clo+NClo level633 on 7/3 -continie Depakote 500 mg BID, on 1500 mg VPA level was 109 -trazodone 100 mg po nightly for insomniaPRN -Vistaril50 mg PRN  #Tachycardia and HTN,VS are stable -continueMetoprolol12.5 mg BID  #Metabolic syndrome prevention -continue Metformin 500 mg daily  #Labs -lipid panel, TSH and A1Care unremerkable -EKGreviewed, NSR with QTc 374  #Social -family meeting completed -mother prefers [facility] team over [facility] peer supportbut [facility] refused to accept the patient as they believe both, the patient and his mother, have limited insight into his problems and havenot been able to accept consequences ofdiagnosis of schizophrenia -theyalso refuse to apply for disabilityor guardianship -wewillpetitioned courtfor 90 days of involuntary outpatient psychiatric commitment to [facility]upon discharge  #Disposition -discharge with family -follow upwith [facility]    Kristine Linea, MD 01/29/2018, 8:05 AM

## 2018-01-29 MED ORDER — DIVALPROEX SODIUM 500 MG PO DR TAB: 500.0000 mg | DELAYED_RELEASE_TABLET | Freq: Two times a day (BID) | ORAL | 1 refills | Status: DC

## 2018-01-29 NOTE — [facility] Group Notes (Signed)
  01/29/2018  Time: 1PM  Type of Therapy/Topic:  Group Therapy:  Balance in Life  Participation Level:  Did Not Attend  Description of Group:   This group will address the concept of balance and how it feels and looks when one is unbalanced. Patients will be encouraged to process areas in their lives that are out of balance and identify reasons for remaining unbalanced. Facilitators will guide patients in utilizing problem-solving interventions to address and correct the stressor making their life unbalanced. Understanding and applying boundaries will be explored and addressed for obtaining and maintaining a balanced life. Patients will be encouraged to explore ways to assertively make their unbalanced needs known to significant others in their lives, using other group members and facilitator for support and feedback.  Therapeutic Goals: 1. Patient will identify two or more emotions or situations they have that consume much of in their lives. 2. Patient will identify signs/triggers that life has become out of balance:  3. Patient will identify two ways to set boundaries in order to achieve balance in their lives:  4. Patient will demonstrate ability to communicate their needs through discussion and/or role plays  Summary of Patient Progress: Pt was invited to attend group but chose not to attend. CSW will continue to encourage pt to attend group throughout their admission.     Therapeutic Modalities:   Cognitive Behavioral Therapy Solution-Focused Therapy Assertiveness Training  Heidi DachKelsey Keri Veale, MSW, LCSW Clinical Social Worker 01/29/2018 2:47 PM

## 2018-01-29 NOTE — B[facility] Group Notes (Signed)
LCSW Group Therapy Note 01/29/2018 9:00 AM  Type of Therapy and Topic:  Group Therapy:  Setting Goals  Participation Level:  Did Not Attend  Description of Group: In this process group, patients discussed using strengths to work toward goals and address challenges.  Patients identified two positive things about themselves and one goal they were working on.  Patients were given the opportunity to share openly and support each other's plan for self-empowerment.  The group discussed the value of gratitude and were encouraged to have a daily reflection of positive characteristics or circumstances.  Patients were encouraged to identify a plan to utilize their strengths to work on current challenges and goals.  Therapeutic Goals 1. Patient will verbalize personal strengths/positive qualities and relate how these can assist with achieving desired personal goals 2. Patients will verbalize affirmation of peers plans for personal change and goal setting 3. Patients will explore the value of gratitude and positive focus as related to successful achievement of goals 4. Patients will verbalize a plan for regular reinforcement of personal positive qualities and circumstances.  Summary of Patient Progress:  [name] [name] was invited to today's group, but chose not to attend.     Therapeutic Modalities Cognitive Behavioral Therapy Motivational Interviewing    Alease FrameSonya S Aashika Carta, [place] 01/29/2018 2:14 PM

## 2018-01-29 NOTE — Progress Notes (Signed)
D- Patient alert and oriented. Patient presents in a pleasant mood on assessment stating that he slept "good" last night. Patient also states that he is feeling "better". Patient had no major complaints at this time. Patient denies SI, HI, AVH, and pain at this time. Patient's goal for today is to "get better".   A- Scheduled medications administered to patient, per MD orders. Support and encouragement provided.  Routine safety checks conducted every 15 minutes. Patient informed to notify staff with problems or concerns.  R- No adverse drug reactions noted. Patient contracts for safety at this time. Patient compliant with medications and treatment plan. Patient receptive, calm, and cooperative. Patient interacts well with others on the unit.  Patient remains safe at this time.

## 2018-01-29 NOTE — Progress Notes (Signed)
  [facility] Discharge [facility] :  Will you be returning to the same living situation after discharge:  Yes,  Home with mother] At discharge, do you have transportation home?: Yes,  Taxi Do you have the ability to pay for your medications: Yes,  Referred to a provider who can assist  Release of information consent forms completed and in the chart;  Patient's signature needed at discharge.  Patient to Follow up at: Follow-up Information    [facility] Go on 01/30/2018.   Why:  Please [facility] with [name] [name] at [facility] for peer support services on Friday January 30, 2018. He will be picking you up at 7am. His number is [phone]. Thank you. Contact information: [address] [place] [address] [phone]           Next level of care provider has access to [facility] Link:no  Safety Planning and Suicide Prevention discussed: Yes,  Completed with patient  Have you used any form of tobacco in the last 30 days? (Cigarettes, Smokeless Tobacco, Cigars, and/or Pipes): No  Has patient been referred to the Quitline?: N/A patient is not a smoker  Patient has been referred for addiction treatment: N/A  [name] ShearsCassandra  Jenessa Gillingham, LCSW 01/29/2018, 9:34 AM

## 2018-01-29 NOTE — Progress Notes (Signed)
Recreation Therapy Notes   Date: 01/29/2018  Time: 9:30 am   Location: Craft Room   Behavioral response: N/A   Intervention Topic:  Anger Management   Discussion/Intervention: Patient did not attend group.   Clinical Observations/Feedback:  Patient did not attend group.   Elieser Tetrick LRT/CTRS        Kendel Bessey 01/29/2018 10:41 AM

## 2018-01-29 NOTE — Plan of Care (Signed)
Polite, end conversation with "Yes Sir", peer to peer interaction appropriate, observed playing [employer] Aid[employer]ss Game with peers, no angry affect, reduced paranoia, improved emotional status, denied SI/HI/AVH.  I spent significant time on the phone with his mother; she was allowed to vent; she sounds very pitiful as a single parent; she expressed patient exposure to illicit drugs, but thankful that her son has been sober since last year; she talked about how, by patient's name, he was harassed as a Muslim/Arabic and always prayed that his son will not become a victim of persecution; she expressed how his son became Catatonic when on Abilify. Pleased so far with the ongoing care.  Patient slept for Estimated Hours of 7; Precautionary checks every 15 minutes for safety maintained, room free of safety hazards, patient sustains no injury or falls during this shift.  Problem: Education: Goal: Emotional status will improve Outcome: Progressing Goal: Mental status will improve Outcome: Progressing   Problem: Coping: Goal: Ability to verbalize frustrations and anger appropriately will improve Outcome: Progressing   Problem: Health Behavior/Discharge Planning: Goal: Ability to remain free from injury will improve Outcome: Progressing

## 2018-01-29 NOTE — Progress Notes (Signed)
Patient ID: [name] [name], male   DOB: 09-03-1995, 22 y.o.   MRN: [id number]  Discharge Note:  Patient denies SI/HI/AVH at this time. Discharge instructions, AVS, prescriptions, and transition record gone over with patient and family. Patient agrees to comply with medication management, follow-up visit, and outpatient therapy. Patient belongings returned to patient. Patient and family questions and concerns addressed and answered. Patient ambulatory off unit. Patient discharged to home with mother. Patient and mother waiting for taxi cab to pick them up from hospital.

## 2018-01-29 NOTE — Progress Notes (Signed)
Recreation Therapy Notes  Date: 01/29/2018  Time: 3:00pm  Location: Craft room  Behavioral response: Appropriate  Group Type: Game  Participation level: Active  Communication: Patient was social with peers and staff.  Comments: N/A  [name] LRT/CTRS        [name] [name] 01/29/2018 3:32 PM 

## 2018-01-29 NOTE — Progress Notes (Signed)
Recreation Therapy Notes  INPATIENT RECREATION TR PLAN  Patient Details Name: [name] [name] MRN: [id number] DOB: 1995-11-11 Today's Date: 01/29/2018  Rec Therapy Plan Is patient appropriate for Therapeutic Recreation?: Yes Treatment times per week: at least 3 Estimated Length of Stay: 5-7 days TR Treatment/Interventions: Group participation (Comment)  Discharge Criteria Pt will be discharged from therapy if:: Discharged Treatment plan/goals/alternatives discussed and agreed upon by:: Patient/family  Discharge Summary Short term goals set:  Patient will engage in interactions with peers and staff in pro-social manner at least 2x within 5 recreation therapy group sessions Short term goals met: Not met Progress toward goals comments: Groups attended Which groups?: Communication, Coping skills, Leisure education, AAA/T Reason goals not met: Patient spent most of his time in his room Therapeutic equipment acquired: N/A Reason patient discharged from therapy: Discharge from hospital Pt/family agrees with progress & goals achieved: Yes Date patient discharged from therapy: 01/29/18   Caley Volkert 01/29/2018, 1:58 PM

## 2018-03-06 ENCOUNTER — Encounter: Payer: Self-pay | Admitting: Emergency Medicine

## 2018-03-06 ENCOUNTER — Other Ambulatory Visit: Payer: Self-pay

## 2018-03-06 ENCOUNTER — Emergency Department
Admission: EM | Admit: 2018-03-06 | Discharge: 2018-03-07 | Disposition: A | Discharge status: Other Acute Inpatient Hospital | Payer: Medicaid Other | Attending: Emergency Medicine | Admitting: Emergency Medicine

## 2018-03-06 DIAGNOSIS — Z91199 Patient's noncompliance with other medical treatment and regimen due to unspecified reason: Secondary | ICD-10-CM

## 2018-03-06 DIAGNOSIS — I10 Essential (primary) hypertension: Secondary | ICD-10-CM | POA: Diagnosis not present

## 2018-03-06 DIAGNOSIS — Z7984 Long term (current) use of oral hypoglycemic drugs: Secondary | ICD-10-CM | POA: Insufficient documentation

## 2018-03-06 DIAGNOSIS — Z91148 Patient's other noncompliance with medication regimen for other reason: Secondary | ICD-10-CM

## 2018-03-06 DIAGNOSIS — E119 Type 2 diabetes mellitus without complications: Secondary | ICD-10-CM | POA: Insufficient documentation

## 2018-03-06 DIAGNOSIS — J45909 Unspecified asthma, uncomplicated: Secondary | ICD-10-CM | POA: Insufficient documentation

## 2018-03-06 DIAGNOSIS — F250 Schizoaffective disorder, bipolar type: Secondary | ICD-10-CM | POA: Diagnosis present

## 2018-03-06 DIAGNOSIS — F23 Brief psychotic disorder: Secondary | ICD-10-CM | POA: Insufficient documentation

## 2018-03-06 DIAGNOSIS — Z9119 Patient's noncompliance with other medical treatment and regimen: Secondary | ICD-10-CM

## 2018-03-06 LAB — COMPREHENSIVE METABOLIC PANEL
ALT: 25 U/L (ref 0–44)
AST: 21 U/L (ref 15–41)
Albumin: 4.6 g/dL (ref 3.5–5.0)
Alkaline Phosphatase: 70 U/L (ref 38–126)
Anion gap: 10 (ref 5–15)
BUN: 14 mg/dL (ref 6–20)
CO2: 24 mmol/L (ref 22–32)
Calcium: 9.6 mg/dL (ref 8.9–10.3)
Chloride: 108 mmol/L (ref 98–111)
Creatinine, Ser: 1.04 mg/dL (ref 0.61–1.24)
GFR calc Af Amer: >60 mL/min (ref >60)
GFR calc non Af Amer: >60 mL/min (ref >60)
Glucose, Bld: 98 mg/dL (ref 70–99)
Potassium: 4 mmol/L (ref 3.5–5.1)
Sodium: 142 mmol/L (ref 135–145)
Total Bilirubin: 1.1 mg/dL (ref 0.3–1.2)
Total Protein: 8.2 g/dL — ABNORMAL HIGH (ref 6.5–8.1)

## 2018-03-06 LAB — CBC
HCT: 48.1 % (ref 40.0–52.0)
Hemoglobin: 16.8 g/dL (ref 13.0–18.0)
MCH: 28.8 pg (ref 26.0–34.0)
MCHC: 34.9 g/dL (ref 32.0–36.0)
MCV: 82.6 fL (ref 80.0–100.0)
Platelets: 260 10*3/uL (ref 150–440)
RBC: 5.82 MIL/uL (ref 4.40–5.90)
RDW: 13.1 % (ref 11.5–14.5)
WBC: 4.8 10*3/uL (ref 3.8–10.6)

## 2018-03-06 LAB — ETHANOL: Alcohol, Ethyl (B): <10 mg/dL (ref <10)

## 2018-03-06 LAB — DIFFERENTIAL
Basophils Absolute: 0 10*3/uL (ref 0–0.1)
Basophils Relative: 0 %
Eosinophils Absolute: 0 10*3/uL (ref 0–0.7)
Eosinophils Relative: 0 %
Lymphocytes Relative: 31 %
Lymphs Abs: 1.6 10*3/uL (ref 1.0–3.6)
Monocytes Absolute: 0.4 10*3/uL (ref 0.2–1.0)
Monocytes Relative: 9 %
Neutro Abs: 3.1 10*3/uL (ref 1.4–6.5)
Neutrophils Relative %: 60 %

## 2018-03-06 MED ORDER — TRAZODONE HCL 100 MG PO TABS
100.0000 mg | ORAL_TABLET | Freq: Every day | ORAL | Status: DC
  Administered 2018-03-06: 100 mg via ORAL
  Filled 2018-03-06: qty 1

## 2018-03-06 MED ORDER — CLOZAPINE 100 MG PO TABS
300.0000 mg | ORAL_TABLET | Freq: Every day | ORAL | Status: DC
  Administered 2018-03-06: 300 mg via ORAL
  Filled 2018-03-06: qty 3

## 2018-03-06 MED ORDER — METOPROLOL TARTRATE 25 MG PO TABS
12.5000 mg | ORAL_TABLET | Freq: Two times a day (BID) | ORAL | Status: DC
  Administered 2018-03-06 – 2018-03-07 (×3): 12.5 mg via ORAL
  Filled 2018-03-06 (×3): qty 1

## 2018-03-06 MED ORDER — DIVALPROEX SODIUM 500 MG PO DR TAB
500.0000 mg | DELAYED_RELEASE_TABLET | Freq: Two times a day (BID) | ORAL | Status: DC
  Administered 2018-03-06 – 2018-03-07 (×3): 500 mg via ORAL
  Filled 2018-03-06 (×3): qty 1

## 2018-03-06 MED ORDER — METFORMIN HCL 500 MG PO TABS
500.0000 mg | ORAL_TABLET | Freq: Every day | ORAL | Status: DC
  Administered 2018-03-07: 500 mg via ORAL
  Filled 2018-03-06: qty 1

## 2018-03-06 NOTE — BH Assessment (Addendum)
This writer attempted to complete assessment, however patient was noncompliant, agitated, and demanded writer leave and shut his door.   Writer attempted to call patient's mother with number listed in chart, however number is disconnected.

## 2018-03-06 NOTE — Consult Note (Addendum)
MEDICATION RELATED CONSULT NOTE - INITIAL   Pharmacy Consult for Clozapine REMs reporting and Lab Monitoring  Indication: Schizoaffective disorder   Labs: Recent Labs    03/06/18 1304  WBC 4.8  HGB 16.8  HCT 48.1  PLT 260  CREATININE 1.04  ALBUMIN 4.6  PROT 8.2*  AST 21  ALT 25  ALKPHOS 70  BILITOT 1.1   CrCl cannot be calculated (Unknown ideal weight.).   Microbiology: No results found for this or any previous visit (from the past 720 hour(s)).  Medical History: Past Medical History:  Diagnosis Date  . Asthma   . Depression   . Psychosis [facility](HCC)     Assessment: Pharmacy consulted for clozapine lab monitoring and REMs Program Reporting in 22 yo male with PMH of Schizoaffective disorder. Patient prescribed Clozapine 300mg  PTA.   Plan:  8/16 ANC: 1600. Lab reported to online Clozapine REMs Program. Patient is eligible to receive clozapine with weekly lab monitoring.  Next CBC with Diff 8/23 @1000 .   Gardner CandleSheema M Nakesha Ebrahim, PharmD, BCPS Clinical Pharmacist 03/06/2018 5:56 PM

## 2018-03-06 NOTE — BH Assessment (Addendum)
[facility] has been [facility].  [facility]  Accepting physician is Dr. Estill Cottahomas Cornwall. Call report to [phone] Representative was [facility]  ER Staff is aware of it:  Glenda: ER Secretary  Dr. Don PerkingVeronese: ER MD  Geralynn OchsJadeka: Patient's Nurse     Patient's Family/Support System : Unable to reach a family member with the numbers in patient chart.   Address: [address], [place], [place] [address]   *Can come tomorrow (03/07/2018) after 11:00am*

## 2018-03-06 NOTE — ED Triage Notes (Addendum)
Pt to ED via [employer], was called to park where pt was found standing in street and nonverbal. PT hx of schizophrenia. [employer] unaware how long pt was outside. Pt appears to be anxious and upset. Poor historian, denies SI or HI,  [employer] has IVC paperwork . VSS . Mother has been contacted by [employer] with no success,

## 2018-03-06 NOTE — ED Notes (Signed)
IVC/Consult completed/ Pending placement 

## 2018-03-06 NOTE — Consult Note (Signed)
[facility] Consult   Reason for Consult: Consult for this 22 year old man with history of psychosis brought in by law enforcement Referring Physician: Siadecki Patient Identification: [name] [name] MRN:  [id number] Principal Diagnosis: Schizoaffective disorder, bipolar type ([place]) Diagnosis:   Patient Active Problem List   Diagnosis Date Noted  . Diabetes ([place]) [E11.9] 03/06/2018  . Hypertension [I10] 03/06/2018  . Schizoaffective disorder, bipolar type ([place]) [F25.0] 12/30/2017  . Noncompliance [Z91.19] 12/30/2017  . Aggressive behavior of adolescent [R46.89] 10/24/2015    Total Time spent with patient: 1 hour  Subjective:   [name] [name] is a 22 y.o. male patient admitted with "I was standing".  HPI: Patient seen chart reviewed.  Patient known from previous encounters.  This is a 22 year old man with a history of psychotic disorder who is brought by police.  They report that they found him in public standing around looking dazed and confused.  When they spoke with him the patient was incoherent and unable to make any sense.  They brought him to the emergency room under IVC.  On my interview the patient is clearly psychotic.  He has severe thought blocking and is constantly responding to internal stimuli.  His answers to questions are often nonsensical and he frequently asks me to repeat even the most basic questions because of his inattention.  He does tell me that he has not been taking any of his psychiatric medicines since his last discharge.  Denies that he has been using any drugs.  Not able to answer with any clarity questions about suicidality.  Social history: Patient lives with his mother and some other family members.  Not able to give me much of a description of what he has been doing since last discharge.  Medical history: Has developed some high blood sugars and high blood pressure.  Substance abuse history: Denies that he is been using any drugs  recently drug screen is still pending.  Past Psychiatric History: In his young life the patient has now had several hospitalizations largely I think because of not only what seems like the severity of his illness but his refusal to be compliant with medicines outside the hospital.  Last time he was here in the hospital it was for an extended stay during which time he was treated with clozapine and Depakote.  Discharged about a month ago.  No known suicide attempts but he has bizarre behavior the puts himself in danger when he is psychotic.  Risk to Self:   Risk to Others:   Prior Inpatient Therapy:   Prior Outpatient Therapy:    Past Medical History:  Past Medical History:  Diagnosis Date  . Asthma   . Depression   . Psychosis [facility])     Past Surgical History:  Procedure Laterality Date  . BACK SURGERY     Family History: No family history on file. Family Psychiatric  History: None known Social History:  Social History   Substance and Sexual Activity  Alcohol Use No     Social History   Substance and Sexual Activity  Drug Use Yes  . Types: Marijuana    Social History   Socioeconomic History  . Marital status: Single    Spouse name: Not on file  . Number of children: Not on file  . Years of education: Not on file  . Highest education level: Not on file  Occupational History  . Not on file  Social Needs  . Financial resource strain: Not on  file  . Food insecurity:    Worry: Not on file    Inability: Not on file  . Transportation needs:    Medical: Not on file    Non-medical: Not on file  Tobacco Use  . Smoking status: Never Smoker  . Smokeless tobacco: Never Used  Substance and Sexual Activity  . Alcohol use: No  . Drug use: Yes    Types: Marijuana  . Sexual activity: Not Currently  Lifestyle  . Physical activity:    Days per week: Not on file    Minutes per session: Not on file  . Stress: Not on file  Relationships  . Social connections:    Talks on  phone: Not on file    Gets together: Not on file    Attends religious service: Not on file    Active member of club or organization: Not on file    Attends meetings of clubs or organizations: Not on file    Relationship status: Not on file  Other Topics Concern  . Not on file  Social History Narrative  . Not on file   Additional Social History:    Allergies:  No Known Allergies  Labs:  Results for orders placed or performed during the hospital encounter of 03/06/18 (from the past 48 hour(s))  Comprehensive metabolic panel     Status: Abnormal   Collection Time: 03/06/18  1:04 PM  Result Value Ref Range   Sodium 142 135 - 145 mmol/L   Potassium 4.0 3.5 - 5.1 mmol/L   Chloride 108 98 - 111 mmol/L   CO2 24 22 - 32 mmol/L   Glucose, Bld 98 70 - 99 mg/dL   BUN 14 6 - 20 mg/dL   Creatinine, Ser 1.04 0.61 - 1.24 mg/dL   Calcium 9.6 8.9 - 10.3 mg/dL   Total Protein 8.2 (H) 6.5 - 8.1 g/dL   Albumin 4.6 3.5 - 5.0 g/dL   AST 21 15 - 41 U/L   ALT 25 0 - 44 U/L   Alkaline Phosphatase 70 38 - 126 U/L   Total Bilirubin 1.1 0.3 - 1.2 mg/dL   GFR calc non Af Amer >60 >60 mL/min   GFR calc Af Amer >60 >60 mL/min    Comment: (NOTE) The eGFR has been calculated using the CKD EPI equation. This calculation has not been validated in all clinical situations. eGFR's persistently <60 mL/min signify possible Chronic Kidney Disease.    Anion gap 10 5 - 15    Comment: Performed at [facility], [address] [place], [place] [address]  Ethanol     Status: None   Collection Time: 03/06/18  1:04 PM  Result Value Ref Range   Alcohol, Ethyl (B) <10 <10 mg/dL    Comment: (NOTE) Lowest detectable limit for serum alcohol is 10 mg/dL. For medical purposes only. Performed at [facility], [address] [place], [place] [address]   cbc     Status: None   Collection Time: 03/06/18  1:04 PM  Result Value Ref Range   WBC 4.8 3.8 - 10.6 K/uL   RBC 5.82 4.40 - 5.90 MIL/uL    Hemoglobin 16.8 13.0 - 18.0 g/dL   HCT 48.1 40.0 - 52.0 %   MCV 82.6 80.0 - 100.0 fL   MCH 28.8 26.0 - 34.0 pg   MCHC 34.9 32.0 - 36.0 g/dL   RDW 13.1 11.5 - 14.5 %   Platelets 260 150 - 440 K/uL  Comment: Performed at [facility], [address] [place], [place] [address]    Current Facility-Administered Medications  Medication Dose Route Frequency Provider Last Rate Last Dose  . cloZAPine (CLOZARIL) tablet 300 mg  300 mg Oral QHS Arali Somera T, MD      . divalproex (DEPAKOTE) DR tablet 500 mg  500 mg Oral Q12H Akasia Ahmad, Madie Reno, MD      . Derrill Memo ON 03/07/2018] metFORMIN (GLUCOPHAGE) tablet 500 mg  500 mg Oral Q breakfast Saskia Simerson T, MD      . metoprolol tartrate (LOPRESSOR) tablet 12.5 mg  12.5 mg Oral BID Belem Hintze T, MD      . traZODone (DESYREL) tablet 100 mg  100 mg Oral QHS Essense Bousquet, Madie Reno, MD       Current Outpatient Medications  Medication Sig Dispense Refill  . cloZAPine (CLOZARIL) 100 MG tablet Take 3 tablets (300 mg total) by mouth at bedtime. 90 tablet 1`  . divalproex (DEPAKOTE) 500 MG DR tablet Take 1 tablet (500 mg total) by mouth every 12 (twelve) hours. 60 tablet 1  . hydrOXYzine (ATARAX/VISTARIL) 50 MG tablet Take 1 tablet (50 mg total) by mouth 3 (three) times daily as needed for anxiety. 90 tablet 1  . metFORMIN (GLUCOPHAGE) 500 MG tablet Take 1 tablet (500 mg total) by mouth daily with breakfast. 30 tablet 1  . metoprolol tartrate (LOPRESSOR) 25 MG tablet Take 0.5 tablets (12.5 mg total) by mouth 2 (two) times daily. 60 tablet 1  . traZODone (DESYREL) 100 MG tablet Take 1 tablet (100 mg total) by mouth at bedtime as needed for sleep. 30 tablet 1    Musculoskeletal: Strength & Muscle Tone: within normal limits Gait & Station: normal Patient leans: N/A  Psychiatric Specialty Exam: Physical Exam  Nursing note and vitals reviewed. Constitutional: He appears well-developed and well-nourished.  HENT:  Head: Normocephalic and atraumatic.   Eyes: Pupils are equal, round, and reactive to light. Conjunctivae are normal.  Neck: Normal range of motion.  Cardiovascular: Regular rhythm and normal heart sounds.  Respiratory: Effort normal. No respiratory distress.  GI: Soft.  Musculoskeletal: Normal range of motion.  Neurological: He is alert.  Skin: Skin is warm and dry.  Psychiatric: His affect is blunt and inappropriate. He is withdrawn. Cognition and memory are impaired. He expresses inappropriate judgment. He is noncommunicative.    Review of Systems  Constitutional: Negative.   HENT: Negative.   Eyes: Negative.   Respiratory: Negative.   Cardiovascular: Negative.   Gastrointestinal: Negative.   Musculoskeletal: Negative.   Skin: Negative.   Neurological: Negative.   Psychiatric/Behavioral: Negative.     Blood pressure 128/84, pulse 90, temperature 98.6 F (37 C), temperature source Oral, resp. rate 16, SpO2 100 %.There is no height or weight on file to calculate BMI.  General Appearance: Casual  Eye Contact:  Minimal  Speech:  Blocked, Garbled and Slow  Volume:  Decreased  Mood:  Negative  Affect:  Blunt  Thought Process:  Disorganized  Orientation:  Negative  Thought Content:  Illogical  Suicidal Thoughts:  No  Homicidal Thoughts:  No  Memory:  Negative  Judgement:  Negative  Insight:  Negative  Psychomotor Activity:  Decreased  Concentration:  Concentration: Poor  Recall:  Poor  Fund of Knowledge:  Fair  Language:  Fair  Akathisia:  No  Handed:  Right  AIMS (if indicated):     Assets:  Housing Physical Health Resilience  ADL's:  Impaired  Cognition:  Impaired,  Mild  Sleep:        Treatment Plan Summary: Daily contact with patient to assess and evaluate symptoms and progress in treatment, Medication management and Plan 22 year old man with schizophrenia is very psychotic right now.  Pretty severe thought blocking disorganized thinking responding to internal stimuli.  Looks frightened and  anxious.  His affect is bizarre at times any breaks into tears and cannot explain himself.  Has not been compliant with medicine.  Needs to be hospitalized again.  Continue plans for IVC.  Restarted medication including his clozapine and Depakote.  Labs will be completed.  Case reviewed with TTS and emergency room doctor  Disposition: Recommend psychiatric Inpatient admission when medically cleared.  Alethia Berthold, MD 03/06/2018 2:49 PM

## 2018-03-06 NOTE — ED Provider Notes (Signed)
[facility] Emergency Department Provider Note ____________________________________________   First MD Initiated Contact with Patient 03/06/18 1402     (approximate)  I have reviewed the triage vital signs and the nursing notes.   HISTORY  Chief Complaint Psychiatric Evaluation  Level 5 caveat: History of present illness limited due to acute psychosis  HPI [name] [name] is a 22 y.o. male with history of schizoaffective disorder or schizophrenia and psychosis who presents with erratic behavior.  Per police, the patient was found standing in the street, nonverbal, and appearing anxious.  The patient is unable to provide any history.   Past Medical History:  Diagnosis Date  . Asthma   . Depression   . Psychosis [facility](HCC)     Patient Active Problem List   Diagnosis Date Noted  . Diabetes (HCC) 03/06/2018  . Hypertension 03/06/2018  . Schizoaffective disorder, bipolar type (HCC) 12/30/2017  . Noncompliance 12/30/2017  . Aggressive behavior of adolescent 10/24/2015    Past Surgical History:  Procedure Laterality Date  . BACK SURGERY      Prior to Admission medications   Medication Sig Start Date End Date Taking? Authorizing Provider  cloZAPine (CLOZARIL) 100 MG tablet Take 3 tablets (300 mg total) by mouth at bedtime. 01/20/18   [name], Braulio ConteJolanta B, MD  divalproex (DEPAKOTE) 500 MG DR tablet Take 1 tablet (500 mg total) by mouth every 12 (twelve) hours. 01/29/18   [name], Braulio ConteJolanta B, MD  hydrOXYzine (ATARAX/VISTARIL) 50 MG tablet Take 1 tablet (50 mg total) by mouth 3 (three) times daily as needed for anxiety. 01/21/18   [name], Braulio ConteJolanta B, MD  metFORMIN (GLUCOPHAGE) 500 MG tablet Take 1 tablet (500 mg total) by mouth daily with breakfast. 01/21/18   [name], [name], MD  metoprolol tartrate (LOPRESSOR) 25 MG tablet Take 0.5 tablets (12.5 mg total) by mouth 2 (two) times daily. 01/20/18   [name], Braulio ConteJolanta B, MD  traZODone (DESYREL) 100 MG  tablet Take 1 tablet (100 mg total) by mouth at bedtime as needed for sleep. 01/20/18   [name], Ellin GoodieJolanta B, MD    Allergies Patient has no known allergies.  No family history on file.  Social History Social History   Tobacco Use  . Smoking status: Never Smoker  . Smokeless tobacco: Never Used  Substance Use Topics  . Alcohol use: No  . Drug use: Yes    Types: Marijuana    Review of Systems Level 5 caveat: Unable to obtain review of systems due to acute psychosis    ____________________________________________   PHYSICAL EXAM:  VITAL SIGNS: ED Triage Vitals  Enc Vitals Group     BP 03/06/18 1255 128/84     Pulse Rate 03/06/18 1255 90     Resp 03/06/18 1255 16     Temp 03/06/18 1255 98.6 F (37 C)     Temp Source 03/06/18 1255 Oral     SpO2 03/06/18 1255 100 %     Weight --      Height --      Head Circumference --      Peak Flow --      Pain Score 03/06/18 1256 0     Pain Loc --      Pain Edu? --      Excl. in GC? --     Constitutional: Alert, anxious appearing. Eyes: Conjunctivae are normal.  Head: Atraumatic. Nose: No congestion/rhinnorhea. Mouth/Throat: Mucous membranes are moist.   Neck: Normal range of motion.  Cardiovascular: Good peripheral  circulation. Respiratory: Normal respiratory effort. Gastrointestinal: No distention.  Musculoskeletal: Extremities warm and well perfused.  Neurologic: Motor intact in all extremities. Skin:  Skin is warm and dry. No rash noted. Psychiatric: Anxious appearing.  ____________________________________________   LABS (all labs ordered are listed, but only abnormal results are displayed)  Labs Reviewed  COMPREHENSIVE METABOLIC PANEL - Abnormal; Notable for the following components:      Result Value   Total Protein 8.2 (*)    All other components within normal limits  ETHANOL  CBC  URINE DRUG SCREEN, QUALITATIVE ([facility] ONLY)  DIFFERENTIAL    ____________________________________________  EKG   ____________________________________________  RADIOLOGY    ____________________________________________   PROCEDURES  Procedure(s) performed: No  Procedures  Critical Care performed: No ____________________________________________   INITIAL IMPRESSION / ASSESSMENT AND PLAN / ED COURSE  Pertinent labs & imaging results that were available during my care of the patient were reviewed by me and considered in my medical decision making (see chart for details).  22 year old male with apparent prior psychiatric history presents with erratic behavior.  The patient was standing in the street and was nonverbal.  He is anxious appearing.    On exam, the vital signs are normal.  The patient is alert and ambulatory.  When I went into the room he was standing by the bed and appeared paranoid and anxious.  He was able to tell me his name but did not answer any other questions.  Presentation is concerning for acute psychosis.  We will obtain labs for medical clearance, psychiatric consult, and I anticipate likely admission.  ----------------------------------------- 3:12 PM on 03/06/2018 -----------------------------------------  Labs are unremarkable.  Psychiatry consult is pending.  I am signing the patient out to the oncoming physician Dr. Don PerkingVeronese.  ____________________________________________   FINAL CLINICAL IMPRESSION(S) / ED DIAGNOSES  Final diagnoses:  Acute psychosis (HCC)      NEW MEDICATIONS STARTED DURING THIS VISIT:  New Prescriptions   No medications on file     Note:  This document was prepared using Dragon voice recognition software and may include unintentional dictation errors.    Dionne BucySiadecki, Maytte Jacot, MD 03/06/18 [phone]

## 2018-03-06 NOTE — ED Notes (Signed)
Patient is still  Displaying bizarre behavior, he stands in the room, and has a blank look he refused dinner

## 2018-03-06 NOTE — ED Notes (Signed)
ED BHU PLACEMENT JUSTIFICATION Is the patient under IVC or is there intent for IVC: Yes.   Is the patient medically cleared: Yes.   Is there vacancy in the ED BHU: Yes.   Is the population mix appropriate for patient: Yes.   Is the patient awaiting placement in inpatient or outpatient setting: Yes.   Has the patient had a psychiatric consult: Yes.   Survey of unit performed for contraband, proper placement and condition of furniture, tampering with fixtures in bathroom, shower, and each patient room: Yes.   APPEARANCE/BEHAVIOR adequate rapport can be established NEURO ASSESSMENT Orientation: place and person Hallucinations: Yes.  Auditory Hallucinations and Visual Hallucinations Speech: Normal Gait: normal RESPIRATORY ASSESSMENT Normal expansion.  Clear to auscultation.  No rales, rhonchi, or wheezing. CARDIOVASCULAR ASSESSMENT regular rate and rhythm, S1, S2 normal, no murmur, click, rub or gallop GASTROINTESTINAL ASSESSMENT soft, nontender, BS WNL, no r/g EXTREMITIES normal strength, tone, and muscle mass PLAN OF CARE Provide calm/safe environment. Vital signs assessed twice daily. ED BHU Assessment once each 12-hour shift. Collaborate with intake RN daily or as condition indicates. Assure the ED provider has rounded once each shift. Provide and encourage hygiene. Provide redirection as needed. Assess for escalating behavior; address immediately and inform ED provider.  Assess family dynamic and appropriateness for visitation as needed: Yes.   Educate the patient/family about BHU procedures/visitation: Yes.   

## 2018-03-06 NOTE — ED Notes (Signed)
Patient refused to answer assessment questions by the writer. Patient just stared at nurse, and responded by saying ok when let him know he needs to drink fluids

## 2018-03-06 NOTE — BH Assessment (Signed)
Inpatient psychiatric referrals send to the following:   . [facility]  [address] [place] [address]  [phone]  . [facility]  [address], [place] [address]  [phone]  . [facility]  [name] [place] [address]  [phone]  . [facility]   [address] [place] [address]  [phone]  . [facility] [address] [place] [address]  [phone]  . [facility]   [address], [place] [address]  [phone]  . [facility]   [address] [place] [address]  [phone]

## 2018-03-07 ENCOUNTER — Inpatient Hospital Stay
Admission: AD | Admit: 2018-03-07 | Discharge: 2018-04-03 | DRG: 885 | Disposition: A | Discharge status: Home / Self Care | Payer: Medicaid Other | Attending: Psychiatry | Admitting: Psychiatry

## 2018-03-07 ENCOUNTER — Other Ambulatory Visit: Payer: Self-pay

## 2018-03-07 DIAGNOSIS — F419 Anxiety disorder, unspecified: Secondary | ICD-10-CM | POA: Diagnosis present

## 2018-03-07 DIAGNOSIS — Z9119 Patient's noncompliance with other medical treatment and regimen: Secondary | ICD-10-CM

## 2018-03-07 DIAGNOSIS — Z91199 Patient's noncompliance with other medical treatment and regimen due to unspecified reason: Secondary | ICD-10-CM

## 2018-03-07 DIAGNOSIS — E860 Dehydration: Secondary | ICD-10-CM | POA: Diagnosis not present

## 2018-03-07 DIAGNOSIS — F202 Catatonic schizophrenia: Secondary | ICD-10-CM | POA: Diagnosis present

## 2018-03-07 DIAGNOSIS — F250 Schizoaffective disorder, bipolar type: Secondary | ICD-10-CM | POA: Diagnosis present

## 2018-03-07 DIAGNOSIS — Z7984 Long term (current) use of oral hypoglycemic drugs: Secondary | ICD-10-CM

## 2018-03-07 DIAGNOSIS — Y92230 Patient room in hospital as the place of occurrence of the external cause: Secondary | ICD-10-CM | POA: Diagnosis not present

## 2018-03-07 DIAGNOSIS — I959 Hypotension, unspecified: Secondary | ICD-10-CM | POA: Diagnosis not present

## 2018-03-07 DIAGNOSIS — E119 Type 2 diabetes mellitus without complications: Secondary | ICD-10-CM | POA: Diagnosis present

## 2018-03-07 DIAGNOSIS — Z9114 Patient's other noncompliance with medication regimen: Secondary | ICD-10-CM

## 2018-03-07 DIAGNOSIS — I471 Supraventricular tachycardia: Secondary | ICD-10-CM | POA: Diagnosis not present

## 2018-03-07 DIAGNOSIS — J45909 Unspecified asthma, uncomplicated: Secondary | ICD-10-CM | POA: Diagnosis present

## 2018-03-07 DIAGNOSIS — F23 Brief psychotic disorder: Secondary | ICD-10-CM | POA: Diagnosis not present

## 2018-03-07 DIAGNOSIS — T424X5A Adverse effect of benzodiazepines, initial encounter: Secondary | ICD-10-CM | POA: Diagnosis not present

## 2018-03-07 DIAGNOSIS — I10 Essential (primary) hypertension: Secondary | ICD-10-CM | POA: Diagnosis present

## 2018-03-07 DIAGNOSIS — Z91148 Patient's other noncompliance with medication regimen for other reason: Secondary | ICD-10-CM

## 2018-03-07 DIAGNOSIS — F203 Undifferentiated schizophrenia: Secondary | ICD-10-CM | POA: Diagnosis present

## 2018-03-07 DIAGNOSIS — Z79899 Other long term (current) drug therapy: Secondary | ICD-10-CM | POA: Diagnosis not present

## 2018-03-07 MED ORDER — CLOZAPINE 100 MG PO TABS
300.0000 mg | ORAL_TABLET | Freq: Every day | ORAL | Status: DC
  Administered 2018-03-07 – 2018-04-02 (×25): 300 mg via ORAL
  Filled 2018-03-07 (×9): qty 3
  Filled 2018-03-07: qty 12
  Filled 2018-03-07 (×15): qty 3
  Filled 2018-03-07: qty 12

## 2018-03-07 MED ORDER — DIVALPROEX SODIUM 500 MG PO DR TAB
500.0000 mg | DELAYED_RELEASE_TABLET | Freq: Two times a day (BID) | ORAL | Status: DC
  Administered 2018-03-07 – 2018-03-24 (×33): 500 mg via ORAL
  Filled 2018-03-07 (×6): qty 1
  Filled 2018-03-07: qty 2
  Filled 2018-03-07 (×28): qty 1

## 2018-03-07 MED ORDER — MAGNESIUM HYDROXIDE 400 MG/5ML PO SUSP
30.0000 mL | Freq: Every day | ORAL | Status: DC | PRN
  Administered 2018-03-17: 30 mL via ORAL
  Filled 2018-03-07: qty 30

## 2018-03-07 MED ORDER — ACETAMINOPHEN 325 MG PO TABS: 650.0000 mg | ORAL_TABLET | Freq: Four times a day (QID) | ORAL | Status: DC | PRN

## 2018-03-07 MED ORDER — HYDROXYZINE HCL 50 MG PO TABS: 50.0000 mg | ORAL_TABLET | Freq: Three times a day (TID) | ORAL | Status: DC | PRN

## 2018-03-07 MED ORDER — METFORMIN HCL 500 MG PO TABS
500.0000 mg | ORAL_TABLET | Freq: Every day | ORAL | Status: DC
  Administered 2018-03-08 – 2018-04-03 (×27): 500 mg via ORAL
  Filled 2018-03-07 (×27): qty 1

## 2018-03-07 MED ORDER — TRAZODONE HCL 100 MG PO TABS
100.0000 mg | ORAL_TABLET | Freq: Every day | ORAL | Status: DC
  Administered 2018-03-07 – 2018-04-02 (×25): 100 mg via ORAL
  Filled 2018-03-07 (×26): qty 1

## 2018-03-07 MED ORDER — METOPROLOL TARTRATE 25 MG PO TABS
12.5000 mg | ORAL_TABLET | Freq: Two times a day (BID) | ORAL | Status: DC
  Administered 2018-03-07 – 2018-04-01 (×44): 12.5 mg via ORAL
  Filled 2018-03-07 (×49): qty 1
  Filled 2018-03-07: qty 0.5

## 2018-03-07 MED ORDER — ALUM & MAG HYDROXIDE-SIMETH 200-200-20 MG/5ML PO SUSP: 30.0000 mL | ORAL | Status: DC | PRN

## 2018-03-07 NOTE — ED Notes (Signed)
Patient is being discharged/transferred to room 315, he will be transferring via w/c with escort of police and nurse, patient is calm, all belongings taken with him to floor.

## 2018-03-07 NOTE — ED Notes (Signed)
Patient talked to nurse , breakfast was given to him with beverage, He said he would eat later, will continue to monitor, q 15 minute checks and camera surveillance in progress for safety.

## 2018-03-07 NOTE — Tx Team (Signed)
Initial Treatment Plan 03/07/2018 3:36 PM [name] [name] [id number]    PATIENT STRESSORS: Financial difficulties Health problems   PATIENT STRENGTHS: Average or above average intelligence Supportive family/friends   PATIENT IDENTIFIED PROBLEMS: "I just want to get better so that I can go back home."   "I don't want to be confused all of the time."                   DISCHARGE CRITERIA:  Adequate post-discharge living arrangements Improved stabilization in mood, thinking, and/or behavior  PRELIMINARY DISCHARGE PLAN: Outpatient therapy Return to previous living arrangement  PATIENT/FAMILY INVOLVEMENT: This treatment plan has been presented to and reviewed with the patient, [name] [name], and/or family member.  The patient and family have been given the opportunity to ask questions and make suggestions.  Jim DesanctisYawn L Esta Carmon, RN 03/07/2018, 3:36 PM

## 2018-03-07 NOTE — Progress Notes (Signed)
22 year old male admitted to unit. Denies SI/HI/AVH and pain. Patient reports that current stressors is that fact that he wants to "get better and live a normal life".  Skin and contraband search completed by staff RN and witnessed by Emeterio ReeveShatara, RN.  Skin  warm, dry and intact. No contraband found on patient nor his belongings. Admission assessment completed, fluid and nutrition offered. Patient remains safe on the unit with q 15 minute checks.

## 2018-03-07 NOTE — Plan of Care (Signed)
Patient newly admitted and is not able to verbalize or complete any goals at this time.

## 2018-03-07 NOTE — ED Notes (Signed)
Patient ate 100% of lunch and beverage.  

## 2018-03-07 NOTE — Progress Notes (Signed)
Patient denies SI/HI/AVH. Patient's skin is assessed by this nurse and Emeterio ReeveShatara RN. Patient skin is intact. No contraband found during this search. Patient oriented to unit.

## 2018-03-07 NOTE — ED Notes (Signed)
Nurse gave report to [facility] on the BMU unit, patient with be transferring to room 315.

## 2018-03-07 NOTE — BH Assessment (Signed)
[facility] by Dr. Toni Amendlapacs.  Attending Physician will be Dr. Jennet MaduroPucilowska.   Patient has been assigned to room 315, by [facility] Charge Nurse Shaune Pollacklubukola.   Intake Paper Work has been signed and placed on patient chart.  ER staff is aware of the admission:  [facility]   Dr. Alphonzo LemmingsMcShane, ER MD   Claris CheMargaret Patient's Nurse   Marylene LandAngela Patient Access.

## 2018-03-07 NOTE — ED Provider Notes (Signed)
-----------------------------------------   9:27 AM on 03/07/2018 -----------------------------------------   Blood pressure 128/84, pulse 80, temperature 98.6 F (37 C), temperature source Oral, resp. rate 16, SpO2 100 %.  The patient had no acute events since last update.  Calm and cooperative at this time.  Disposition is pending Psychiatry/Behavioral Medicine team recommendations.     Willy Eddyobinson, Parilee Hally, MD 03/07/18 [phone]

## 2018-03-08 LAB — LIPID PANEL
CHOL/HDL RATIO: 4.3 ratio
Cholesterol: 238 mg/dL — ABNORMAL HIGH (ref 0–200)
HDL: 56 mg/dL (ref >40)
LDL Cholesterol: 168 mg/dL — ABNORMAL HIGH (ref 0–99)
TRIGLYCERIDES: 69 mg/dL (ref <150)
VLDL: 14 mg/dL (ref 0–40)

## 2018-03-08 LAB — HEMOGLOBIN A1C
Hgb A1c MFr Bld: 4.9 % (ref 4.8–5.6)
Mean Plasma Glucose: 93.93 mg/dL

## 2018-03-08 LAB — TSH: TSH: 0.647 u[IU]/mL (ref 0.350–4.500)

## 2018-03-08 NOTE — BHH Group Notes (Signed)
LCSW Group Therapy Note 03/08/2018 1:15pm  Type of Therapy and Topic: Group Therapy: Feelings Around Returning Home & Establishing a Supportive Framework and Supporting Oneself When Supports Not Available  Participation Level: Did Not Attend  Description of Group:  Patients first processed thoughts and feelings about upcoming discharge. These included fears of upcoming changes, lack of change, new living environments, judgements and expectations from others and overall stigma of mental health issues. The group then discussed the definition of a supportive framework, what that looks and feels like, and how do to discern it from an unhealthy non-supportive network. The group identified different types of supports as well as what to do when your family/friends are less than helpful or unavailable  Therapeutic Goals  1. Patient will identify one healthy supportive network that they can use at discharge. 2. Patient will identify one factor of a supportive framework and how to tell it from an unhealthy network. 3. Patient able to identify one coping skill to use when they do not have positive supports from others. 4. Patient will demonstrate ability to communicate their needs through discussion and/or role plays.  Summary of Patient Progress:  Pt was invited to attend group but chose not to attend. CSW will continue to encourage pt to attend group throughout their admission.   Therapeutic Modalities Cognitive Behavioral Therapy Motivational Interviewing   Yatzari Jonsson  CUEBAS-COLON, LCSW 03/08/2018 12:45 PM 

## 2018-03-08 NOTE — Progress Notes (Signed)
Patient is alert and oriented X 4 denies SI, HI and AVH. Patient is compliant with medications, complains of being tired this morning. Patient refused breakfast and lunch and stated to the nutritionist he did not want any ensure supplemental drink. Patient spent majority of the day in bed; isolated. There are no self injurious behaviors noted.

## 2018-03-08 NOTE — Plan of Care (Signed)
Encourage patient to verbalize positive feeling about self , and participate in groups, compliant with prescribed medication  regimen, working on coping skills, denies any SI/HI , patient is safe in the unit 15 minute rounding is in progress.   Problem: Education: Goal: Ability to state activities that reduce stress will improve Outcome: Progressing   Problem: Coping: Goal: Ability to identify and develop effective coping behavior will improve Outcome: Progressing   Problem: Self-Concept: Goal: Ability to identify factors that promote anxiety will improve Outcome: Progressing Goal: Level of anxiety will decrease Outcome: Progressing Goal: Ability to modify response to factors that promote anxiety will improve Outcome: Progressing   Problem: Education: Goal: Knowledge of General Education information will improve Description Including pain rating scale, medication(s)/side effects and non-pharmacologic comfort measures Outcome: Progressing   Problem: Health Behavior/Discharge Planning: Goal: Ability to manage health-related needs will improve Outcome: Progressing   Problem: Clinical Measurements: Goal: Ability to maintain clinical measurements within normal limits will improve Outcome: Progressing Goal: Will remain free from infection Outcome: Progressing Goal: Diagnostic test results will improve Outcome: Progressing Goal: Respiratory complications will improve Outcome: Progressing Goal: Cardiovascular complication will be avoided Outcome: Progressing   Problem: Activity: Goal: Risk for activity intolerance will decrease Outcome: Progressing   Problem: Nutrition: Goal: Adequate nutrition will be maintained Outcome: Progressing   Problem: Coping: Goal: Level of anxiety will decrease Outcome: Progressing   Problem: Elimination: Goal: Will not experience complications related to bowel motility Outcome: Progressing Goal: Will not experience complications related to  urinary retention Outcome: Progressing   Problem: Pain Managment: Goal: General experience of comfort will improve Outcome: Progressing   Problem: Safety: Goal: Ability to remain free from injury will improve Outcome: Progressing   Problem: Skin Integrity: Goal: Risk for impaired skin integrity will decrease Outcome: Progressing

## 2018-03-08 NOTE — BHH Suicide Risk Assessment (Signed)
BHH INPATIENT:  Family/Significant Other Suicide Prevention Education  Suicide Prevention Education:  Patient Refusal for Family/Significant Other Suicide Prevention Education: The patient [name] [name] has refused to provide written consent for family/significant other to be provided Family/Significant Other Suicide Prevention Education during admission and/or prior to discharge.  Physician notified.  Sadira Standard  CUEBAS-COLON 03/08/2018, 4:17 PM

## 2018-03-08 NOTE — BHH Suicide Risk Assessment (Signed)
[facility]   Nursing information obtained from:  Patient Demographic factors:  Male Current Mental Status:  NA Loss Factors:  Decrease in vocational status, Loss of significant relationship, Financial problems / change in socioeconomic status Historical Factors:  Impulsivity Risk Reduction Factors:  Positive social support, Living with another person, especially a relative  Total Time spent with patient: 1 hour Principal Problem: Schizoaffective disorder, bipolar type (HCC) Diagnosis:   Patient Active Problem List   Diagnosis Date Noted  . Diabetes (HCC) [E11.9] 03/06/2018  . Hypertension [I10] 03/06/2018  . Schizoaffective disorder, bipolar type (HCC) [F25.0] 12/30/2017  . Noncompliance [Z91.19] 12/30/2017  . Aggressive behavior of adolescent [R46.89] 10/24/2015   Subjective Data:  "I  guess I overreacted to something, maybe I walked away from my house" pt is 22 year old man with a history of psychotic disorder admitted from ED. Pt admitted under IVC. Per report, pt was  brought to ED by police, pt  was  found  in public standing looking around dazed and confused, psychotic. Pt reportedly had thought blocking and is constantly responding to internal stimuli, and  making nonsensical answers. he has not been taking any of his psychiatric medicines since his last discharge. Denies that he has been using any drugs. Pt lying in his bed, very disorganized, poor historian, unable to tell the circumstances of his hospitalization, Denies AVH at this time, but pt noted to be responding to unseen, makes poor eye contact, blunted affect, gives slow and tangential answers. Denies SI/HI.  States he  lives with his mother and little brother. Denies that he is been using any drugs recently drug screen is still pending. BAL <10.   Continued Clinical Symptoms:  Alcohol Use Disorder Identification Test Final Score (AUDIT): 0 The "Alcohol Use Disorders Identification Test",  Guidelines for Use in Primary Care, Second Edition.  World Science writerHealth Organization [place] [facility](WHO). Score between 0-7:  no or low risk or alcohol related problems. Score between 8-15:  moderate risk of alcohol related problems. Score between 16-19:  high risk of alcohol related problems. Score 20 or above:  warrants further diagnostic evaluation for alcohol dependence and treatment.   CLINICAL FACTORS:   Currently Psychotic   Musculoskeletal: Strength & Muscle Tone: within normal limits Gait & Station: normal Patient leans:   Psychiatric Specialty Exam: Physical Exam  Nursing note and vitals reviewed.   ROS  Blood pressure 118/76, pulse (!) 102, temperature 97.8 F (36.6 C), temperature source Oral, resp. rate 18, height 6' 0.44" (1.84 m), weight 86.2 kg, SpO2 100 %.Body mass index is 25.46 kg/m.  General Appearance:Casual  Eye Contact:Minimal  Speech: Garbled and Slow  Volume:Decreased  Mood:depressed  Affect:Blunted  Thought Process:Disorganized, block at times  Orientation:Negative  Thought Content:Illogical, talking to unseen  Suicidal Thoughts:No  Homicidal Thoughts:No  Memory:Negative  Judgement:Negative  Insight:Negative  Psychomotor Activity:Decreased  Concentration:Concentration:Poor  Recall:Poor  Fund of Knowledge:Fair  Language:Fair  Akathisia:No  Handed:Right  AIMS (if indicated):   Assets:Housing Physical Health Resilience  ADL's:Impaired  Cognition:Impaired,Mild  Sleep: 7.3          COGNITIVE FEATURES THAT CONTRIBUTE TO RISK:  None    SUICIDE RISK:   mild  PLAN OF CARE:  Daily contact with patient to assess and evaluate symptoms and progress in treatment and Medication management.  Pt with h/o schizophrenia is presenting with psychotic decompensation. Pt not compliant with meds.  Depakote and Clozaril restarted. ANC-3.1, wbc- 4.8  Observation Level/Precautions:  15 minute checks  Laboratory:  CBC Chemistry Profile HbAIC UDS UA  Psychotherapy:    Medications:    Consultations:    Discharge Concerns:    Estimated LOS:  Other:     Physician Treatment Plan for Primary Diagnosis: Schizoaffective disorder, bipolar type (HCC) Long Term Goal(s): Improvement in symptoms so as ready for discharge  Short Term Goals: Ability to identify changes in lifestyle to reduce recurrence of condition will improve, Ability to verbalize feelings will improve, Ability to disclose and discuss suicidal ideas, Ability to demonstrate self-control will improve, Ability to identify and develop effective coping behaviors will improve, Ability to maintain clinical measurements within normal limits will improve, Compliance with prescribed medications will improve and Ability to identify triggers associated with substance abuse/mental health issues will improve  Physician Treatment Plan for Secondary Diagnosis: Principal Problem:   Schizoaffective disorder, bipolar type (HCC) Active Problems:   Noncompliance   Diabetes (HCC)   Hypertension  Long Term Goal(s): Improvement in symptoms so as ready for discharge  Short Term Goals: Ability to identify changes in lifestyle to reduce recurrence of condition will improve, Ability to verbalize feelings will improve, Ability to disclose and discuss suicidal ideas, Ability to demonstrate self-control will improve, Ability to identify and develop effective coping behaviors will improve, Ability to maintain clinical measurements within normal limits will improve, Compliance with prescribed medications will improve and Ability to identify triggers associated with substance abuse/mental health issues will improve   I certify that inpatient services furnished can reasonably be expected to improve the patient's condition.   Beverly SessionsJagannath Shonika Kolasinski, MD 03/08/2018, 1:38 PM

## 2018-03-08 NOTE — H&P (Signed)
Psychiatric Admission Assessment Adult  Patient Identification: [name] [name] MRN:  [id number] Date of Evaluation:  03/08/2018 Chief Complaint:  Schizoaffective disorder, bipolar type Principal Diagnosis: Schizoaffective disorder, bipolar type (HCC) Diagnosis:   Patient Active Problem List   Diagnosis Date Noted  . Diabetes (HCC) [E11.9] 03/06/2018  . Hypertension [I10] 03/06/2018  . Schizoaffective disorder, bipolar type (HCC) [F25.0] 12/30/2017  . Noncompliance [Z91.19] 12/30/2017  . Aggressive behavior of adolescent [R46.89] 10/24/2015   History of Present Illness:  "I  guess I overreacted to something, maybe I walked away from my house"  pt is 22 year old man with a history of psychotic disorder admitted from ED. Pt admitted under IVC. Per report, pt was  brought to ED by police, pt  was  found  in public standing looking around dazed and confused, psychotic. Pt reportedly had thought blocking and is constantly responding to internal stimuli, and  making nonsensical answers. he has not been taking any of his psychiatric medicines since his last discharge.  Denies that he has been using any drugs. Pt lying in his bed, very disorganized, poor historian, unable to tell the circumstances of his hospitalization, Denies AVH at this time, but pt noted to be responding to unseen, makes poor eye contact, blunted affect, gives slow and tangential answers. Denies SI/HI.  States he  lives with his mother and little brother. Denies that he is been using any drugs recently drug screen is still pending. BAL <10.  Associated Signs/Symptoms: Depression Symptoms:  fatigue, anxiety, (Hypo) Manic Symptoms:  Labiality of Mood, Anxiety Symptoms:   Psychotic Symptoms:  Talking to unseen, guarded PTSD Symptoms: Negative Total Time spent with patient: 1 hour  Past Psychiatric History:  several hospitalizations , medication non compliance.  Last hospitalization [facility]  for an extended stay during which time  he was treated with clozapine and Depakote.  Discharged about a month ago.  No known suicide attempts but he has bizarre behavior the puts himself in danger when he is psychotic.In his young life the patient has now had several hospitalizations largely I think because of not only what seems like the severity of his illness but his refusal to be compliant with medicines outside the hospital.  Last time he was here in the hospital it was for an extended stay during which time he was treated with clozapine and Depakote.  Discharged about a month ago.  No known suicide attempts but he has bizarre behavior the puts himself in danger when he is psychotic.  Is the patient at risk to self? No.  Has the patient been a risk to self in the past 6 months? No.  Has the patient been a risk to self within the distant past? No.  Is the patient a risk to others? No.  Has the patient been a risk to others in the past 6 months? No.  Has the patient been a risk to others within the distant past? No.   Prior Inpatient Therapy:   Prior Outpatient Therapy:    Alcohol Screening: 1. How often do you have a drink containing alcohol?: Never 2. How many drinks containing alcohol do you have on a typical day when you are drinking?: 1 or 2 3. How often do you have six or more drinks on one occasion?: Never AUDIT-C Score: 0 4. How often during the last year have you found that you were not able to stop drinking once you had started?: Never 5. How often during the last year have you  failed to do what was normally expected from you becasue of drinking?: Never 6. How often during the last year have you needed a first drink in the morning to get yourself going after a heavy drinking session?: Never 7. How often during the last year have you had a feeling of guilt of remorse after drinking?: Never 8. How often during the last year have you been unable to remember what happened the night before because you had been drinking?:  Never 9. Have you or someone else been injured as a result of your drinking?: No 10. Has a relative or friend or a doctor or another health worker been concerned about your drinking or suggested you cut down?: No Alcohol Use Disorder Identification Test Final Score (AUDIT): 0 Intervention/Follow-up: AUDIT Score <7 follow-up not indicated Substance Abuse History in the last 12 months:  No. Consequences of Substance Abuse: NA Previous Psychotropic Medications: Yes  Psychological Evaluations:  Past Medical History:  Past Medical History:  Diagnosis Date  . Asthma   . Depression   . Psychosis [facility])     Past Surgical History:  Procedure Laterality Date  . BACK SURGERY     Family History: History reviewed. No pertinent family history.unknown Family Psychiatric  History:  Tobacco Screening: Have you used any form of tobacco in the last 30 days? (Cigarettes, Smokeless Tobacco, Cigars, and/or Pipes): No Social History:  Social History   Substance and Sexual Activity  Alcohol Use No     Social History   Substance and Sexual Activity  Drug Use Yes  . Types: Marijuana    Additional Social History:                           Allergies:  No Known Allergies Lab Results: No results found for this or any previous visit (from the past 48 hour(s)).  Blood Alcohol level:  Lab Results  Component Value Date   ETH <10 03/06/2018   ETH <10 12/30/2017    Metabolic Disorder Labs:  Lab Results  Component Value Date   HGBA1C 4.6 (L) 12/30/2017   MPG 85.32 12/30/2017   No results found for: PROLACTIN Lab Results  Component Value Date   CHOL 185 12/30/2017   TRIG 125 12/30/2017   HDL 63 12/30/2017   CHOLHDL 2.9 12/30/2017   VLDL 25 12/30/2017   LDLCALC 97 12/30/2017    Current Medications: Current Facility-Administered Medications  Medication Dose Route Frequency Provider Last Rate Last Dose  . acetaminophen (TYLENOL) tablet 650 mg  650 mg Oral Q6H PRN [name], [name]  T, MD      . alum & mag hydroxide-simeth (MAALOX/MYLANTA) 200-200-20 MG/5ML suspension 30 mL  30 mL Oral Q4H PRN [name], [name], MD      . cloZAPine (CLOZARIL) tablet 300 mg  300 mg Oral QHS [name], [name], MD   300 mg at 03/07/18 2125  . divalproex (DEPAKOTE) DR tablet 500 mg  500 mg Oral Q12H [name], [name], MD   500 mg at 03/08/18 9604  . hydrOXYzine (ATARAX/VISTARIL) tablet 50 mg  50 mg Oral TID PRN [name], [name], MD      . magnesium hydroxide (MILK OF MAGNESIA) suspension 30 mL  30 mL Oral Daily PRN [name], [name], MD      . metFORMIN (GLUCOPHAGE) tablet 500 mg  500 mg Oral Q breakfast [name], [name], MD   500 mg at 03/08/18 0852  . metoprolol tartrate (LOPRESSOR) tablet  12.5 mg  12.5 mg Oral BID [name], [name], MD   12.5 mg at 03/08/18 0852  . traZODone (DESYREL) tablet 100 mg  100 mg Oral QHS [name], [name], MD   100 mg at 03/07/18 2125   PTA Medications: Medications Prior to Admission  Medication Sig Dispense Refill Last Dose  . cloZAPine (CLOZARIL) 100 MG tablet Take 3 tablets (300 mg total) by mouth at bedtime. 90 tablet 1` Unknown at Unknown  . divalproex (DEPAKOTE) 500 MG DR tablet Take 1 tablet (500 mg total) by mouth every 12 (twelve) hours. 60 tablet 1 Unknown at Unknown  . hydrOXYzine (ATARAX/VISTARIL) 50 MG tablet Take 1 tablet (50 mg total) by mouth 3 (three) times daily as needed for anxiety. 90 tablet 1 Unknown at Unknown  . metFORMIN (GLUCOPHAGE) 500 MG tablet Take 1 tablet (500 mg total) by mouth daily with breakfast. 30 tablet 1 Unknown at Unknown  . metoprolol tartrate (LOPRESSOR) 25 MG tablet Take 0.5 tablets (12.5 mg total) by mouth 2 (two) times daily. 60 tablet 1 Unknown at Unknown  . traZODone (DESYREL) 100 MG tablet Take 1 tablet (100 mg total) by mouth at bedtime as needed for sleep. 30 tablet 1 Unknown at Unknown    Musculoskeletal: Strength & Muscle Tone: within normal limits Gait & Station: normal Patient leans:   Psychiatric Specialty  Exam: Physical Exam  Nursing note and vitals reviewed.   ROS  Blood pressure 118/76, pulse (!) 102, temperature 97.8 F (36.6 C), temperature source Oral, resp. rate 18, height 6' 0.44" (1.84 m), weight 86.2 kg, SpO2 100 %.Body mass index is 25.46 kg/m.  General Appearance: Casual  Eye Contact:  Minimal  Speech:   Garbled and Slow  Volume:  Decreased  Mood:  depressed  Affect:  Blunted  Thought Process:  Disorganized, block at times  Orientation:  Negative  Thought Content:  Illogical, talking to unseen  Suicidal Thoughts:  No  Homicidal Thoughts:  No  Memory:  Negative  Judgement:  Negative  Insight:  Negative  Psychomotor Activity:  Decreased  Concentration:  Concentration: Poor  Recall:  Poor  Fund of Knowledge:  Fair  Language:  Fair  Akathisia:  No  Handed:  Right  AIMS (if indicated):     Assets:  Housing Physical Health Resilience  ADL's:  Impaired  Cognition:  Impaired,  Mild  Sleep:   7.3        Treatment Plan Summary: Daily contact with patient to assess and evaluate symptoms and progress in treatment and Medication management.  Pt with h/o schizophrenia is presenting with psychotic decompensation. Pt not compliant with meds.  Depakote and Clozaril restarted. ANC-3.1, wbc- 4.8  Observation Level/Precautions:  15 minute checks  Laboratory:  CBC Chemistry Profile HbAIC UDS UA  Psychotherapy:    Medications:    Consultations:    Discharge Concerns:    Estimated LOS:  Other:     Physician Treatment Plan for Primary Diagnosis: Schizoaffective disorder, bipolar type (HCC) Long Term Goal(s): Improvement in symptoms so as ready for discharge  Short Term Goals: Ability to identify changes in lifestyle to reduce recurrence of condition will improve, Ability to verbalize feelings will improve, Ability to disclose and discuss suicidal ideas, Ability to demonstrate self-control will improve, Ability to identify and develop effective coping behaviors will  improve, Ability to maintain clinical measurements within normal limits will improve, Compliance with prescribed medications will improve and Ability to identify triggers associated with substance abuse/mental health issues will improve  Physician Treatment Plan for Secondary Diagnosis: Principal Problem:   Schizoaffective disorder, bipolar type (HCC) Active Problems:   Noncompliance   Diabetes (HCC)   Hypertension  Long Term Goal(s): Improvement in symptoms so as ready for discharge  Short Term Goals: Ability to identify changes in lifestyle to reduce recurrence of condition will improve, Ability to verbalize feelings will improve, Ability to disclose and discuss suicidal ideas, Ability to demonstrate self-control will improve, Ability to identify and develop effective coping behaviors will improve, Ability to maintain clinical measurements within normal limits will improve, Compliance with prescribed medications will improve and Ability to identify triggers associated with substance abuse/mental health issues will improve  I certify that inpatient services furnished can reasonably be expected to improve the patient's condition.    Beverly SessionsJagannath Ah Bott, MD 8/18/20191:10 PM

## 2018-03-08 NOTE — [facility] Counselor (Signed)
Adult Comprehensive Assessment  Patient ID: [name] [name], male   DOB: 1996/06/07, 22 y.o.   MRN: [id number]  Information Source: Information source: Patient  Current Stressors:  Patient states their primary concerns and needs for treatment are:: "I feel bad again" Patient states their goals for this hospitilization and ongoing recovery are::"I don't want to talk about it" Educational / Learning stressors: None reported Employment / Job issues: Unemployed Family Relationships: Pt. mentions issues with his mother but will not go into detail about what issues there are. Financial / Lack of resources (include bankruptcy): None reported Housing / Lack of housing: Lives with his mother and brother Physical health (include injuries & life threatening diseases): None reported Social relationships: None reported Substance abuse: None reported Bereavement / Loss: None reported  Living/Environment/Situation:  Living Arrangements: Parent Living conditions (as described by patient or guardian): Pt. lives with his mother Who else lives in the home?: Mother and little brother How long has patient lived in current situation?: All of his life What is atmosphere in current home: Chaotic  Family History:  Marital status: Single Are you sexually active?: No What is your sexual orientation?: Heterosexual Has your sexual activity been affected by drugs, alcohol, medication, or emotional stress?: N/A Does patient have children?: No  Childhood History:  By whom was/is the patient raised?: Mother Additional childhood history information: Pt. reports that he doesnt have a relationship with his father. Description of patient's relationship with caregiver when they were a child: Patient report that the relationship was "nothing special" Patient's description of current relationship with people who raised him/her: Pt. reports "It's bad" Does patient have siblings?: Yes Number of Siblings:  1 Description of patient's current relationship with siblings: "Complicated" Did patient suffer any verbal/emotional/physical/sexual abuse as a child?: No Did patient suffer from severe childhood neglect?: No Has patient ever been sexually abused/assaulted/raped as an adolescent or adult?: No Was the patient ever a victim of a crime or a disaster?: No Witnessed domestic violence?: No Has patient been effected by domestic violence as an adult?: No  Education:  Highest grade of school patient has completed: GED Currently a Consulting civil engineerstudent?: No Learning disability?: No  Employment/Work Situation:   Employment situation: Unemployed Patient's job has been impacted by current illness: No What is the longest time patient has a held a job?: 1 year Where was the patient employed at that time?: Pt. [employer] that he cannot remember. Did You Receive Any Psychiatric Treatment/Services While in the Military?: No Are There Guns or Other Weapons in Your Home?: No  Financial Resources:   Financial resources: Support from parents / caregiver Does patient have a Lawyerrepresentative payee or guardian?: No  Alcohol/Substance Abuse:   What has been your use of drugs/alcohol within the last 12 months?: Pt. denies any use  Social Support System:   Forensic psychologistatient's Community Support System: Poor Describe Community Support System: Pt. reports his mother, brother and family. Type of faith/religion: Ephriam KnucklesChristian How does patient's faith help to cope with current illness?: "Not sure it does"  Leisure/Recreation:   Leisure and Hobbies: Walking  Strengths/Needs:   What is the patient's perception of their strengths?: None Patient states they can use these personal strengths during their treatment to contribute to their recovery: N/A Patient states these barriers may affect/interfere with their treatment: N/A Patient states these barriers may affect their return to the community: None Other important information patient  would like considered in planning for their treatment: N/A  Discharge Plan:   Currently receiving  community mental health services: Yes (From Whom) [facility] Patient states concerns and preferences for aftercare planning are: Pt reports that he is gong back to [facility] Patient states they will know when they are safe and ready for discharge when: "I don't know" Does patient have access to transportation?: No Does patient have financial barriers related to discharge medications?: Yes Patient description of barriers related to discharge medications: None Plan for no access to transportation at discharge: CSW will access for transportation means Plan for living situation after discharge: Pt. reports that he will be going back to his mom's apartment Will patient be returning to same living situation after discharge?: yes    Summary/Recommendations:   Patient is a 22 year old male admitted involuntarily and diagnosed with Schizoaffective disorder, bipolar type. Patient was brought to the emergency room by police. The patient was found in public standing looking around dazed and confused. Patient reportedly had thought blocking and is constantly responding to internal stimuli and making nonsensical answers. Patient has not been taking any of his psychiatric medicines since his last discharge. Patient is very disorganized, poor historian, unable to tell the circumstances of his hospitalization. Patient states he lives with his mother and little brother. Patient will benefit from crisis stabilization, medication evaluation, group therapy and psychoeducation. In addition to case management for discharge planning. At discharge it is recommended that patient adhere to the established discharge plan and continue treatment.   [name]  CUEBAS-COLON. 03/08/2018

## 2018-03-08 NOTE — Progress Notes (Signed)
NUTRITION ASSESSMENT  Pt identified as at risk on the Malnutrition Screen Tool  INTERVENTION: Agree with regular diet.  Encouraged intake of 3 regular meals per day and snacks between meals as needed.  Patient refuses any other nutrition interventions.  NUTRITION DIAGNOSIS: Unintentional weight loss related to sub-optimal intake as evidenced by pt report.   Goal: Pt to meet >/= 90% of their estimated nutrition needs.  Monitor:  PO intake  Assessment:  22 y.o. male with PMHx of asthma, depression, psychosis admitted under IVC with schizoaffective disorder.  Met with patient in his room. He was sleeping with covers pulled over his head. He ate 100% of dinner last night per chart but has refused breakfast and lunch today. Tried to discuss with patient why he is refusing meals to see if it is something that can be addressed, but he does not answer. He also did not answer questions about his appetite and intake PTA or weight history. The only time he answered was to refuse any oral nutrition supplements or snacks. Patient does report he is going to try to eat dinner tonight.  Patient was 106.6 kg on 04/29/2016 and was at 99%ile for weight-for-age on growth chart. Now down to 86.2 kg, which he has been stable at for at least 2 months. He is currently at the 88%ile for weight-for-age. Patient unable to provide any more details on how quickly the weight loss occurred.   Medications reviewed and include: metformin 500 mg daily with breakfast.  Labs reviewed: Glucose 98 mg/dL on 03/06/2018. HgbA1c 4.6% on 12/30/2017.  It appears the metformin is a home medication that was added in July. He had one elevated serum glucose (126 mg/dL) on 12/30/2017, but his HgbA1c taken that day shows a mean plasma glucose of 85.32 mg/dL, which is not elevated.   Height: Ht Readings from Last 1 Encounters:  03/07/18 6' 0.44" (1.84 m) (85 %, Z= 1.02)*   * Growth percentiles are based on CDC (Boys, 2-20 Years)  data.    Weight: Wt Readings from Last 1 Encounters:  03/07/18 86.2 kg (88 %, Z= 1.17)*   * Growth percentiles are based on CDC (Boys, 2-20 Years) data.    Weight Hx: Wt Readings from Last 10 Encounters:  03/07/18 86.2 kg (88 %, Z= 1.17)*  12/30/17 83.5 kg (85 %, Z= 1.02)*  12/30/17 86.2 kg (88 %, Z= 1.19)*  04/29/16 106.6 kg (99 %, Z= 2.27)*  04/23/16 106.6 kg (99 %, Z= 2.27)*  10/21/15 90.7 kg (96 %, Z= 1.70)*  12/11/14 77.1 kg (87 %, Z= 1.13)*   * Growth percentiles are based on CDC (Boys, 2-20 Years) data.    BMI:  Body mass index is 25.46 kg/m. Pt meets criteria for normal weight based on current BMI-for-age growth chart CDC (Boys, 2-20 Years).  Estimated Nutritional Needs: Kcal: 25-30 kcal/kg Protein: > 1 gram protein/kg Fluid: 1 ml/kcal  Diet Order:  Diet Order            Diet regular Room service appropriate? Yes; Fluid consistency: Thin  Diet effective now             Pt is also offered choice of unit snacks mid-morning and mid-afternoon.   Willey Blade, MS, [place], LDN Office: [phone] Pager: [phone] After Hours/Weekend Pager: [phone]

## 2018-03-08 NOTE — [facility] Group Notes (Signed)
[facility] Group Notes:  (Nursing/MHT/Case Management/Adjunct)  Date:  03/08/2018  Time:  12:13 AM  Type of Therapy:  Group Therapy  Participation Level:  Did Not Attend    Jinger NeighborsKeith D Gracelynne Benedict 03/08/2018, 12:13 AM

## 2018-03-09 DIAGNOSIS — F250 Schizoaffective disorder, bipolar type: Secondary | ICD-10-CM

## 2018-03-09 LAB — GLUCOSE, CAPILLARY: GLUCOSE-CAPILLARY: 88 mg/dL (ref 70–99)

## 2018-03-09 NOTE — Plan of Care (Signed)
Patient is isolating self from his peers  and avoiding to be seen, in bed cover himself with a sheet, contract for safety, verbal contract.  Takes his ordered meds, sleep long hours,  and safety is maintained no distress. Problem: Education: Goal: Ability to state activities that reduce stress will improve Outcome: Progressing   Problem: Coping: Goal: Ability to identify and develop effective coping behavior will improve Outcome: Progressing   Problem: Self-Concept: Goal: Ability to identify factors that promote anxiety will improve Outcome: Progressing Goal: Level of anxiety will decrease Outcome: Progressing Goal: Ability to modify response to factors that promote anxiety will improve Outcome: Progressing   Problem: Education: Goal: Knowledge of General Education information will improve Description Including pain rating scale, medication(s)/side effects and non-pharmacologic comfort measures Outcome: Progressing   Problem: Health Behavior/Discharge Planning: Goal: Ability to manage health-related needs will improve Outcome: Progressing   Problem: Clinical Measurements: Goal: Ability to maintain clinical measurements within normal limits will improve Outcome: Progressing Goal: Will remain free from infection Outcome: Progressing Goal: Diagnostic test results will improve Outcome: Progressing Goal: Respiratory complications will improve Outcome: Progressing Goal: Cardiovascular complication will be avoided Outcome: Progressing   Problem: Activity: Goal: Risk for activity intolerance will decrease Outcome: Progressing   Problem: Nutrition: Goal: Adequate nutrition will be maintained Outcome: Progressing   Problem: Coping: Goal: Level of anxiety will decrease Outcome: Progressing   Problem: Elimination: Goal: Will not experience complications related to bowel motility Outcome: Progressing Goal: Will not experience complications related to urinary  retention Outcome: Progressing   Problem: Pain Managment: Goal: General experience of comfort will improve Outcome: Progressing   Problem: Safety: Goal: Ability to remain free from injury will improve Outcome: Progressing   Problem: Skin Integrity: Goal: Risk for impaired skin integrity will decrease Outcome: Progressing

## 2018-03-09 NOTE — Progress Notes (Addendum)
[facility] MD Progress Note  03/09/2018 11:22 AM Raahil KAMIL MCHAFFIE  MRN:  [id number]  Subjective:    Mr. Kauth returns to the hospital floridly psychotic in the context of medication noncompliance. There is however a big change in his attitude. He met with treatment team today to tell us that police brought him to the hospital for strange behaviors and that he needs medications to get better. He denies hallucinations but was observed responding to internal stimuli.   Principal Problem: Schizoaffective disorder, bipolar type ([place]) Diagnosis:   Patient Active Problem List   Diagnosis Date Noted  . Schizoaffective disorder, bipolar type ([place]) [F25.0] 12/30/2017    Priority: High  . Diabetes ([place]) [E11.9] 03/06/2018  . Hypertension [I10] 03/06/2018  . Noncompliance [Z91.19] 12/30/2017  . Aggressive behavior of adolescent [R46.89] 10/24/2015   Total Time spent with patient: 20 minutes  Past Psychiatric History: schizophrenia  Past Medical History:  Past Medical History:  Diagnosis Date  . Asthma   . Depression   . Psychosis [facility])     Past Surgical History:  Procedure Laterality Date  . BACK SURGERY     Family History: History reviewed. No pertinent family history. Family Psychiatric  History: none Social History:  Social History   Substance and Sexual Activity  Alcohol Use No     Social History   Substance and Sexual Activity  Drug Use Yes  . Types: Marijuana    Social History   Socioeconomic History  . Marital status: Single    Spouse name: Not on file  . Number of children: Not on file  . Years of education: Not on file  . Highest education level: Not on file  Occupational History  . Not on file  Social Needs  . Financial resource strain: Not on file  . Food insecurity:    Worry: Not on file    Inability: Not on file  . Transportation needs:    Medical: Not on file    Non-medical: Not on file  Tobacco Use  . Smoking status: Never Smoker  . Smokeless tobacco:  Never Used  Substance and Sexual Activity  . Alcohol use: No  . Drug use: Yes    Types: Marijuana  . Sexual activity: Not Currently  Lifestyle  . Physical activity:    Days per week: Not on file    Minutes per session: Not on file  . Stress: Not on file  Relationships  . Social connections:    Talks on phone: Not on file    Gets together: Not on file    Attends religious service: Not on file    Active member of club or organization: Not on file    Attends meetings of clubs or organizations: Not on file    Relationship status: Not on file  Other Topics Concern  . Not on file  Social History Narrative  . Not on file   Additional Social History:                         Sleep: Fair  Appetite:  Fair  Current Medications: Current Facility-Administered Medications  Medication Dose Route Frequency Provider Last Rate Last Dose  . acetaminophen (TYLENOL) tablet 650 mg  650 mg Oral Q6H PRN [name], [name], MD      . alum & mag hydroxide-simeth (MAALOX/MYLANTA) 200-200-20 MG/5ML suspension 30 mL  30 mL Oral Q4H PRN [name], Madie Reno, MD      . cloZAPine (CLOZARIL)  tablet 300 mg  300 mg Oral QHS [name], Madie Reno, MD   300 mg at 03/08/18 2103  . divalproex (DEPAKOTE) DR tablet 500 mg  500 mg Oral Q12H [name], Madie Reno, MD   500 mg at 03/09/18 0908  . hydrOXYzine (ATARAX/VISTARIL) tablet 50 mg  50 mg Oral TID PRN [name], [name], MD      . magnesium hydroxide (MILK OF MAGNESIA) suspension 30 mL  30 mL Oral Daily PRN [name], [name], MD      . metFORMIN (GLUCOPHAGE) tablet 500 mg  500 mg Oral Q breakfast [name], Madie Reno, MD   500 mg at 03/09/18 0909  . metoprolol tartrate (LOPRESSOR) tablet 12.5 mg  12.5 mg Oral BID [name], [name], MD   12.5 mg at 03/08/18 1709  . traZODone (DESYREL) tablet 100 mg  100 mg Oral QHS [name], [name], MD   100 mg at 03/08/18 2103    Lab Results:  Results for orders placed or performed during the hospital encounter of 03/07/18 (from the past 48  hour(s))  Hemoglobin A1c     Status: None   Collection Time: 03/08/18  8:21 PM  Result Value Ref Range   Hgb A1c MFr Bld 4.9 4.8 - 5.6 %    Comment: (NOTE) Pre diabetes:          5.7%-6.4% Diabetes:              >6.4% Glycemic control for   <7.0% adults with diabetes    Mean Plasma Glucose 93.93 mg/dL    Comment: Performed at [facility], [place] [address], [place], [place] [address]  Lipid panel     Status: Abnormal   Collection Time: 03/08/18  8:21 PM  Result Value Ref Range   Cholesterol 238 (H) 0 - 200 mg/dL   Triglycerides 69 <150 mg/dL   HDL 56 >40 mg/dL   Total CHOL/HDL Ratio 4.3 RATIO   VLDL 14 0 - 40 mg/dL   LDL Cholesterol 168 (H) 0 - 99 mg/dL    Comment:        Total Cholesterol/HDL:CHD Risk Coronary Heart Disease Risk Table                     Men   Women  1/2 Average Risk   3.4   3.3  Average Risk       5.0   4.4  2 X Average Risk   9.6   7.1  3 X Average Risk  23.4   11.0        Use the calculated Patient Ratio above and the CHD Risk Table to determine the patient's CHD Risk.        ATP III CLASSIFICATION (LDL):  <100     mg/dL   Optimal  100-129  mg/dL   Near or Above                    Optimal  130-159  mg/dL   Borderline  160-189  mg/dL   High  >190     mg/dL   Very High Performed at [facility], [address] [place], [place] [address]   TSH     Status: None   Collection Time: 03/08/18  8:21 PM  Result Value Ref Range   TSH 0.647 0.350 - 4.500 uIU/mL    Comment: Performed by a 3rd Generation assay with a functional sensitivity of <=0.01 uIU/mL. Performed at [facility],  [place], [place] [address]   Glucose, capillary     Status: None   Collection Time: 03/09/18  8:00 AM  Result Value Ref Range   Glucose-Capillary 88 70 - 99 mg/dL    Blood Alcohol level:  Lab Results  Component Value Date   ETH <10 03/06/2018   ETH <10 05/15/8526    Metabolic Disorder Labs: Lab Results  Component Value Date    HGBA1C 4.9 03/08/2018   MPG 93.93 03/08/2018   MPG 85.32 12/30/2017   No results found for: PROLACTIN Lab Results  Component Value Date   CHOL 238 (H) 03/08/2018   TRIG 69 03/08/2018   HDL 56 03/08/2018   CHOLHDL 4.3 03/08/2018   VLDL 14 03/08/2018   LDLCALC 168 (H) 03/08/2018   LDLCALC 97 12/30/2017    Physical Findings: AIMS: Facial and Oral Movements Muscles of Facial Expression: None, normal Lips and Perioral Area: None, normal Jaw: None, normal Tongue: None, normal,Extremity Movements Upper (arms, wrists, hands, fingers): None, normal Lower (legs, knees, ankles, toes): None, normal, Trunk Movements Neck, shoulders, hips: None, normal, Overall Severity Severity of abnormal movements (highest score from questions above): None, normal Incapacitation due to abnormal movements: None, normal Patient's awareness of abnormal movements (rate only patient's report): No Awareness, Dental Status Current problems with teeth and/or dentures?: No Does patient usually wear dentures?: No  CIWA:    COWS:     Musculoskeletal: Strength & Muscle Tone: within normal limits Gait & Station: normal Patient leans: N/A  Psychiatric Specialty Exam: Physical Exam  Nursing note and vitals reviewed. Psychiatric: His speech is normal. His affect is blunt. He is withdrawn and actively hallucinating. Thought content is paranoid and delusional. Cognition and memory are normal. He expresses impulsivity.    Review of Systems  Neurological: Negative.   Psychiatric/Behavioral: Positive for hallucinations.  All other systems reviewed and are negative.   Blood pressure 110/69, pulse 92, temperature 98 F (36.7 C), temperature source Oral, resp. rate 16, height 6' 0.44" (1.84 m), weight 86.2 kg, SpO2 100 %.Body mass index is 25.46 kg/m.  General Appearance: Fairly Groomed  Eye Contact:  Good  Speech:  Clear and Coherent  Volume:  Decreased  Mood:  Euthymic  Affect:  Flat  Thought Process:   Goal Directed and Descriptions of Associations: Intact  Orientation:  Full (Time, Place, and Person)  Thought Content:  Delusions, Hallucinations: Auditory and Paranoid Ideation  Suicidal Thoughts:  No  Homicidal Thoughts:  No  Memory:  Immediate;   Fair Recent;   Fair Remote;   Fair  Judgement:  Poor  Insight:  Shallow  Psychomotor Activity:  Psychomotor Retardation  Concentration:  Concentration: Fair and Attention Span: Fair  Recall:  [employer] of Knowledge:  Fair  Language:  Fair  Akathisia:  No  Handed:  Right  AIMS (if indicated):     Assets:  Communication Skills Desire for Improvement Financial Resources/Insurance Housing Physical Health Resilience Social Support Vocational/Educational  ADL's:  Intact  Cognition:  WNL  Sleep:  Number of Hours: 8     Treatment Plan Summary: Daily contact with patient to assess and evaluate symptoms and progress in treatment and Medication management   Mr. Ewbank is a 22 year old male with a history of schizophrenia admitted for another psychotic break in the context of treatment noncompliance.  #Psychosis -restarted on Clozapine 300 mg nightly -Depakote 500 mg BID, level in AM -Trazodone 150 mg nightly  #Metabolic syndrome prevention -Metformin 500 mg BID  #  Tachycardia, likely from Clozapine -Metoprolol 12.5 BID  #Labs were obtaine recently  3Disposition -discharge to home with the mother -needs family meeting -needs AT team  Orson Slick, MD 03/09/2018, 11:22 AM

## 2018-03-09 NOTE — Progress Notes (Signed)
This Clinical research associatewriter was called to the community room where breakfast was being served. Informed by the MHT's in the room with the patient that he was walking towards the exit door when he was observed bumping into the food tray cart then falling to the ground. Patient did not loose consciousness during this occurrence. Patient assisted from the floor to a wheelchair and vital signs taken. Results as follows: BP 115/90 P: 87 T:98.6 R:16 and blood sugar 88. MD informed of this incident and attempted to notify his emergency contact, no answer. Will call back at a later time. Patient denies any pain, no headache, no lumps to head area. ROM performed in his room on the bed, denies pain to all extremities at this time. Patient stated that that he felt dizzy prior to the fall. Will continue to monitor patient throughout the shift.

## 2018-03-09 NOTE — Progress Notes (Signed)
Recreation Therapy Notes  INPATIENT RECREATION THERAPY ASSESSMENT  Patient Details Name: [name] [name] MRN: [id number] DOB: Aug 29, 1995 Today's Date: 03/09/2018       Information Obtained From: Patient  Able to Participate in Assessment/Interview: Yes  Patient Presentation: Responsive  Reason for Admission (Per Patient): Active Symptoms, Med Non-Compliance  Patient Stressors:    Coping Skills:   Meditate  Leisure Interests (2+):  Music - Listen  Frequency of Recreation/Participation:    Awareness of Community Resources:  No  Community Resources:     Current Use:    If no, Barriers?:    Expressed Interest in [employer] CorporationCommunity Resource Information: No  [place]  Patient Main Form of Transportation: Walk  Patient Strengths:  Treat people kindly  Patient Identified Areas of Improvement:  listen to people  Patient Goal for Hospitalization:  To listen to what the doctorhas to say  Current SI (including self-harm):  No  Current HI:  No  Current AVH: No  Staff Intervention Plan: Group Attendance, Collaborate with Interdisciplinary Treatment Team  Consent to Intern Participation: N/A  [name] [name] 03/09/2018, 2:48 PM

## 2018-03-09 NOTE — Progress Notes (Signed)
Recreation Therapy Notes  Date: 03/09/2018  Time: 9:30 am   Location: Craft Room   Behavioral response: N/A   Intervention Topic: Stress  Discussion/Intervention: Patient did not attend group.   Clinical Observations/Feedback:  Patient did not attend group.   [name] LRT/CTRS        [name] [name] 03/09/2018 11:44 AM 

## 2018-03-09 NOTE — Plan of Care (Signed)
Patient had a dizzy episode this morning and fell to the floor. No known injuries sustained at this time. Encouraged to use the call light when help is needed.

## 2018-03-09 NOTE — Progress Notes (Signed)
   03/09/18 1040  Clinical Encounter Type  Visited With Patient  Visit Type Initial;Spiritual support;Behavioral Health  Referral From Social work  Consult/Referral To Chaplain  Spiritual Encounters  Spiritual Needs Emotional   CH attended the patient's treatment team meeting. I have meet the patient several times during a previous hospitalization. [name] was very solemn in his domeaner. This is a change from past encounters. The patient stated that he wants to take his medication and get better. He had stopped taking his medication as soon as he was released from his last hospitalization. I will follow up as needed.

## 2018-03-09 NOTE — [facility] Group Notes (Signed)
[facility] Group Notes:  (Nursing/MHT/Case Management/Adjunct)  Date:  03/09/2018  Time:  9:58 PM  Type of Therapy:  Group Therapy  Participation Level:  Did Not Attend  Summary of Progress/Problems:  Mayra NeerJackie L Kambria Grima 03/09/2018, 9:58 PM

## 2018-03-09 NOTE — Progress Notes (Addendum)
[facility]  03/10/2018 9:14 AM [name] [name]  MRN:  [id number]  Subjective:    [name] [name] took his medications only reluctantly this morning. Yesterday during treatment team he was almost enthusiastic about it. He still has hallucinations, is secluded to his room and requests that meals are brought to his room!  Spoke with the mother [phone] or [phone]. The patient has been compliant with medications most of the time since discharge but in the past two days he would refuse and leave the pills on the table. The voices were telling him not to take medications and mother would hear him arguing with the voices about it. He was able to work on his resume and put out many job application but could not make his mind to accept any offers. Apparently, local police is very familiar with his case and the mother stays in close contact with them, especially when the patient is missing.  She agreed to apply for the guardianship and disability if the patient allows.   [facility] with [facility] [phone]. The patient picked up medication from the pharmacy one time only on 7/12. This did not include Clozapine.  Principal Problem: Schizoaffective disorder, bipolar type ([facility]) Diagnosis:   Patient Active Problem List   Diagnosis Date Noted  . Schizoaffective disorder, bipolar type ([facility]) [F25.0] 12/30/2017    Priority: High  . Diabetes ([facility]) [E11.9] 03/06/2018  . Hypertension [I10] 03/06/2018  . Noncompliance [Z91.19] 12/30/2017  . Aggressive behavior of adolescent [R46.89] 10/24/2015   Total Time spent with patient: 20 minutes  Past Psychiatric History: schizophrenia  Past Medical History:  Past Medical History:  Diagnosis Date  . Asthma   . Depression   . Psychosis [facility])     Past Surgical History:  Procedure Laterality Date  . BACK SURGERY     Family History: History reviewed. No pertinent family history. Family Psychiatric  History: none Social History:  Social  History   Substance and Sexual Activity  Alcohol Use No     Social History   Substance and Sexual Activity  Drug Use Yes  . Types: Marijuana    Social History   Socioeconomic History  . Marital status: Single    Spouse name: Not on file  . Number of children: Not on file  . Years of education: Not on file  . Highest education level: Not on file  Occupational History  . Not on file  Social Needs  . Financial resource strain: Not on file  . Food insecurity:    Worry: Not on file    Inability: Not on file  . Transportation needs:    Medical: Not on file    Non-medical: Not on file  Tobacco Use  . Smoking status: Never Smoker  . Smokeless tobacco: Never Used  Substance and Sexual Activity  . Alcohol use: No  . Drug use: Yes    Types: Marijuana  . Sexual activity: Not Currently  Lifestyle  . Physical activity:    Days per week: Not on file    Minutes per session: Not on file  . Stress: Not on file  Relationships  . Social connections:    Talks on phone: Not on file    Gets together: Not on file    Attends religious service: Not on file    Active member of club or organization: Not on file    Attends meetings of clubs or organizations: Not on file    Relationship status: Not on file  Other Topics Concern  . Not on file  Social History Narrative  . Not on file   Additional Social History:                         Sleep: Fair  Appetite:  Fair  Current Medications: Current Facility-Administered Medications  Medication Dose Route Frequency Provider Last Rate Last Dose  . acetaminophen (TYLENOL) tablet 650 mg  650 mg Oral Q6H PRN [name], [name], MD      . alum & mag hydroxide-simeth (MAALOX/MYLANTA) 200-200-20 MG/5ML suspension 30 mL  30 mL Oral Q4H PRN [name], [name], MD      . cloZAPine (CLOZARIL) tablet 300 mg  300 mg Oral QHS [name], [name], MD   300 mg at 03/09/18 2133  . divalproex (DEPAKOTE) DR tablet 500 mg  500 mg Oral Q12H [name], [name]  T, MD   500 mg at 03/10/18 1610  . hydrOXYzine (ATARAX/VISTARIL) tablet 50 mg  50 mg Oral TID PRN [name], [name], MD      . magnesium hydroxide (MILK OF MAGNESIA) suspension 30 mL  30 mL Oral Daily PRN [name], [name], MD      . metFORMIN (GLUCOPHAGE) tablet 500 mg  500 mg Oral Q breakfast [name], [name], MD   500 mg at 03/10/18 0817  . metoprolol tartrate (LOPRESSOR) tablet 12.5 mg  12.5 mg Oral BID [name], [name], MD   12.5 mg at 03/10/18 0817  . traZODone (DESYREL) tablet 100 mg  100 mg Oral QHS [name], [name], MD   100 mg at 03/09/18 2133    Lab Results:  Results for orders placed or performed during the hospital encounter of 03/07/18 (from the past 48 hour(s))  Hemoglobin A1c     Status: None   Collection Time: 03/08/18  8:21 PM  Result Value Ref Range   Hgb A1c MFr Bld 4.9 4.8 - 5.6 %    Comment: (NOTE) Pre diabetes:          5.7%-6.4% Diabetes:              >6.4% Glycemic control for   <7.0% adults with diabetes    Mean Plasma Glucose 93.93 mg/dL    Comment: Performed at [facility], [address], [place], [place] [address]  Lipid panel     Status: Abnormal   Collection Time: 03/08/18  8:21 PM  Result Value Ref Range   Cholesterol 238 (H) 0 - 200 mg/dL   Triglycerides 69 <409 mg/dL   HDL 56 >81 mg/dL   Total CHOL/HDL Ratio 4.3 RATIO   VLDL 14 0 - 40 mg/dL   LDL Cholesterol 191 (H) 0 - 99 mg/dL    Comment:        Total Cholesterol/HDL:CHD Risk Coronary Heart Disease Risk Table                     Men   Women  1/2 Average Risk   3.4   3.3  Average Risk       5.0   4.4  2 X Average Risk   9.6   7.1  3 X Average Risk  23.4   11.0        Use the calculated Patient Ratio above and the CHD Risk Table to determine the patient's CHD Risk.        ATP III CLASSIFICATION (LDL):  <100     mg/dL  Optimal  100-129  mg/dL   Near or Above                    Optimal  130-159  mg/dL   Borderline  098-119  mg/dL   High  >147     mg/dL   Very High Performed at  [facility], [address] [place], [place] [address]   TSH     Status: None   Collection Time: 03/08/18  8:21 PM  Result Value Ref Range   TSH 0.647 0.350 - 4.500 uIU/mL    Comment: Performed by a 3rd Generation assay with a functional sensitivity of <=0.01 uIU/mL. Performed at [facility], [address] [place], [place] [address]   Glucose, capillary     Status: None   Collection Time: 03/09/18  8:00 AM  Result Value Ref Range   Glucose-Capillary 88 70 - 99 mg/dL    Blood Alcohol level:  Lab Results  Component Value Date   ETH <10 03/06/2018   ETH <10 12/30/2017    Metabolic Disorder Labs: Lab Results  Component Value Date   HGBA1C 4.9 03/08/2018   MPG 93.93 03/08/2018   MPG 85.32 12/30/2017   No results found for: PROLACTIN Lab Results  Component Value Date   CHOL 238 (H) 03/08/2018   TRIG 69 03/08/2018   HDL 56 03/08/2018   CHOLHDL 4.3 03/08/2018   VLDL 14 03/08/2018   LDLCALC 168 (H) 03/08/2018   LDLCALC 97 12/30/2017    Physical Findings: AIMS: Facial and Oral Movements Muscles of Facial Expression: None, normal Lips and Perioral Area: None, normal Jaw: None, normal Tongue: None, normal,Extremity Movements Upper (arms, wrists, hands, fingers): None, normal Lower (legs, knees, ankles, toes): None, normal, Trunk Movements Neck, shoulders, hips: None, normal, Overall Severity Severity of abnormal movements (highest score from questions above): None, normal Incapacitation due to abnormal movements: None, normal Patient's awareness of abnormal movements (rate only patient's report): No Awareness, Dental Status Current problems with teeth and/or dentures?: No Does patient usually wear dentures?: No  CIWA:    COWS:     Musculoskeletal: Strength & Muscle Tone: within normal limits Gait & Station: normal Patient leans: N/A  Psychiatric Specialty Exam: Physical Exam  Nursing note and vitals reviewed. Psychiatric: His speech is  normal. His affect is blunt. He is withdrawn and actively hallucinating. Thought content is paranoid and delusional. Cognition and memory are impaired. He expresses impulsivity.    Review of Systems  Neurological: Negative.   Psychiatric/Behavioral: Positive for hallucinations.  All other systems reviewed and are negative.   Blood pressure 130/86, pulse 78, temperature 98.4 F (36.9 C), temperature source Oral, resp. rate 18, height 6' 0.44" (1.84 m), weight 86.2 kg, SpO2 100 %.Body mass index is 25.46 kg/m.  General Appearance: Casual  Eye Contact:  Fair  Speech:  Clear and Coherent  Volume:  Normal  Mood:  Euthymic  Affect:  Blunt  Thought Process:  Goal Directed and Descriptions of Associations: Tangential  Orientation:  Full (Time, Place, and Person)  Thought Content:  Delusions, Hallucinations: Auditory and Paranoid Ideation  Suicidal Thoughts:  No  Homicidal Thoughts:  No  Memory:  Immediate;   Fair Recent;   Fair Remote;   Fair  Judgement:  Poor  Insight:  Lacking  Psychomotor Activity:  Decreased  Concentration:  Concentration: Fair and Attention Span: Fair  Recall:  [employer] of Knowledge:  Fair  Language:  Fair  Akathisia:  No  Handed:  Right  AIMS (if indicated):     Assets:  Communication Skills Desire for Improvement Financial Resources/Insurance Housing Physical Health Resilience Social Support  ADL's:  Intact  Cognition:  WNL  Sleep:  Number of Hours: 7     Treatment Plan Summary: Daily contact with patient to assess and evaluate symptoms and progress in treatment and Medication management   [name] [name] is a 22 year old male with a history of schizophrenia admitted for another psychotic break in the context of treatment noncompliance.  #Psychosis -restarted on Clozapine 300 mg nightly -Depakote 500 mg BID, level in AM -Trazodone 150 mg nightly  #Metabolic syndrome prevention -Metformin 500 mg BID  #Tachycardia, likely from  Clozapine -Metoprolol 12.5 BID  #Labs were obtaine recently  3Disposition -discharge to home with the mother -needs family meeting -needs AT team  [name] LineaJolanta Markeisha Mancias, MD 03/10/2018, 9:14 AM

## 2018-03-09 NOTE — Tx Team (Addendum)
Interdisciplinary Treatment and Diagnostic Plan Update  03/09/2018 Time of Session: 10:30 [name] [name] MRN: [id number]  Principal Diagnosis: Schizoaffective disorder, bipolar type (HCC)  Secondary Diagnoses: Principal Problem:   Schizoaffective disorder, bipolar type (HCC) Active Problems:   Noncompliance   Diabetes (HCC)   Hypertension   Current Medications:  Current Facility-Administered Medications  Medication Dose Route Frequency Provider Last Rate Last Dose  . acetaminophen (TYLENOL) tablet 650 mg  650 mg Oral Q6H PRN [name], [name], MD      . alum & mag hydroxide-simeth (MAALOX/MYLANTA) 200-200-20 MG/5ML suspension 30 mL  30 mL Oral Q4H PRN [name], [name], MD      . cloZAPine (CLOZARIL) tablet 300 mg  300 mg Oral QHS [name], [name], MD   300 mg at 03/08/18 2103  . divalproex (DEPAKOTE) DR tablet 500 mg  500 mg Oral Q12H [name], [name], MD   500 mg at 03/09/18 0908  . hydrOXYzine (ATARAX/VISTARIL) tablet 50 mg  50 mg Oral TID PRN [name], [name], MD      . magnesium hydroxide (MILK OF MAGNESIA) suspension 30 mL  30 mL Oral Daily PRN [name], [name], MD      . metFORMIN (GLUCOPHAGE) tablet 500 mg  500 mg Oral Q breakfast [name], [name], MD   500 mg at 03/09/18 0909  . metoprolol tartrate (LOPRESSOR) tablet 12.5 mg  12.5 mg Oral BID [name], [name], MD   12.5 mg at 03/08/18 1709  . traZODone (DESYREL) tablet 100 mg  100 mg Oral QHS [name], [name], MD   100 mg at 03/08/18 2103   PTA Medications: Medications Prior to Admission  Medication Sig Dispense Refill Last Dose  . cloZAPine (CLOZARIL) 100 MG tablet Take 3 tablets (300 mg total) by mouth at bedtime. 90 tablet 1` Unknown at Unknown  . divalproex (DEPAKOTE) 500 MG DR tablet Take 1 tablet (500 mg total) by mouth every 12 (twelve) hours. 60 tablet 1 Unknown at Unknown  . hydrOXYzine (ATARAX/VISTARIL) 50 MG tablet Take 1 tablet (50 mg total) by mouth 3 (three) times daily as needed for anxiety. 90 tablet 1 Unknown at  Unknown  . metFORMIN (GLUCOPHAGE) 500 MG tablet Take 1 tablet (500 mg total) by mouth daily with breakfast. 30 tablet 1 Unknown at Unknown  . metoprolol tartrate (LOPRESSOR) 25 MG tablet Take 0.5 tablets (12.5 mg total) by mouth 2 (two) times daily. 60 tablet 1 Unknown at Unknown  . traZODone (DESYREL) 100 MG tablet Take 1 tablet (100 mg total) by mouth at bedtime as needed for sleep. 30 tablet 1 Unknown at Unknown    Patient Stressors: Financial difficulties Health problems  Patient Strengths: Average or above average intelligence Supportive family/friends  Treatment Modalities: Medication Management, Group therapy, Case management,  1 to 1 session with clinician, Psychoeducation, Recreational therapy.   Physician Treatment Plan for Primary Diagnosis: Schizoaffective disorder, bipolar type (HCC) Long Term Goal(s): Improvement in symptoms so as ready for discharge Improvement in symptoms so as ready for discharge   Short Term Goals: Ability to identify changes in lifestyle to reduce recurrence of condition will improve Ability to verbalize feelings will improve Ability to disclose and discuss suicidal ideas Ability to demonstrate self-control will improve Ability to identify and develop effective coping behaviors will improve Ability to maintain clinical measurements within normal limits will improve Compliance with prescribed medications will improve Ability to identify triggers associated with substance abuse/mental health issues will improve Ability to identify changes in lifestyle to  reduce recurrence of condition will improve Ability to verbalize feelings will improve Ability to disclose and discuss suicidal ideas Ability to demonstrate self-control will improve Ability to identify and develop effective coping behaviors will improve Ability to maintain clinical measurements within normal limits will improve Compliance with prescribed medications will improve Ability to  identify triggers associated with substance abuse/mental health issues will improve  Medication Management: Evaluate patient's response, side effects, and tolerance of medication regimen.  Therapeutic Interventions: 1 to 1 sessions, Unit Group sessions and Medication administration.  Evaluation of Outcomes: Not Progressing  Physician Treatment Plan for Secondary Diagnosis: Principal Problem:   Schizoaffective disorder, bipolar type (HCC) Active Problems:   Noncompliance   Diabetes (HCC)   Hypertension  Long Term Goal(s): Improvement in symptoms so as ready for discharge Improvement in symptoms so as ready for discharge   Short Term Goals: Ability to identify changes in lifestyle to reduce recurrence of condition will improve Ability to verbalize feelings will improve Ability to disclose and discuss suicidal ideas Ability to demonstrate self-control will improve Ability to identify and develop effective coping behaviors will improve Ability to maintain clinical measurements within normal limits will improve Compliance with prescribed medications will improve Ability to identify triggers associated with substance abuse/mental health issues will improve Ability to identify changes in lifestyle to reduce recurrence of condition will improve Ability to verbalize feelings will improve Ability to disclose and discuss suicidal ideas Ability to demonstrate self-control will improve Ability to identify and develop effective coping behaviors will improve Ability to maintain clinical measurements within normal limits will improve Compliance with prescribed medications will improve Ability to identify triggers associated with substance abuse/mental health issues will improve     Medication Management: Evaluate patient's response, side effects, and tolerance of medication regimen.  Therapeutic Interventions: 1 to 1 sessions, Unit Group sessions and Medication administration.  Evaluation of  Outcomes: Not Progressing   RN Treatment Plan for Primary Diagnosis: Schizoaffective disorder, bipolar type (HCC) Long Term Goal(s): Knowledge of disease and therapeutic regimen to maintain health will improve  Short Term Goals: Ability to remain free from injury will improve, Ability to demonstrate self-control, Ability to identify and develop effective coping behaviors will improve and Compliance with prescribed medications will improve  Medication Management: RN will administer medications as ordered by provider, will assess and evaluate patient's response and provide education to patient for prescribed medication. RN will report any adverse and/or side effects to prescribing provider.  Therapeutic Interventions: 1 on 1 counseling sessions, Psychoeducation, Medication administration, Evaluate responses to treatment, Monitor vital signs and CBGs as ordered, Perform/monitor CIWA, COWS, AIMS and Fall Risk screenings as ordered, Perform wound care treatments as ordered.  Evaluation of Outcomes: Not Progressing   LCSW Treatment Plan for Primary Diagnosis: Schizoaffective disorder, bipolar type (HCC) Long Term Goal(s): Safe transition to appropriate next level of care at discharge, Engage patient in therapeutic group addressing interpersonal concerns.  Short Term Goals: Engage patient in aftercare planning with referrals and resources, Increase social support, Identify triggers associated with mental health/substance abuse issues and Increase skills for wellness and recovery  Therapeutic Interventions: Assess for all discharge needs, 1 to 1 time with Social worker, Explore available resources and support systems, Assess for adequacy in community support network, Educate family and significant other(s) on suicide prevention, Complete Psychosocial Assessment, Interpersonal group therapy.  Evaluation of Outcomes: Not Progressing   Progress in Treatment: Attending groups: No. Participating in  groups: No. Taking medication as prescribed: Yes. Toleration medication: Yes. Family/Significant  other contact made: No, will contact:  Patient refused Patient understands diagnosis: Yes. Discussing patient identified problems/goals with staff: Yes. Medical problems stabilized or resolved: Yes. Denies suicidal/homicidal ideation: Yes. Issues/concerns per patient self-inventory: No. Other:   New problem(s) identified: No, Describe:  None  New Short Term/Long Term Goal(s): "To get back on medications and get better."  Patient Goals:  "To get back on medications and get better."  Discharge Plan or Barriers: To return back to his mothers house and follow up with outpatient treatment.  Reason for Continuation of Hospitalization: Medication stabilization  Estimated Length of Stay: 7 days    Attendees: Patient: [name] [name] 03/09/2018 10:41 AM  Physician: Dr. Cecil CrankerPuciulowska, MD 03/09/2018 10:41 AM  Nursing: Bruce Donathyawn Chisem, RN 03/09/2018 10:41 AM  RN Care Manager: 03/09/2018 10:41 AM  Social Worker: Johny ShearsCassandra Jarrett, LCSWA 03/09/2018 10:41 AM  Recreational Therapist: Danella DeisShay. Dreama SaaOutlaw CTRS, LRT 03/09/2018 10:41 AM  Other: Jake SharkSara Laws, LCSW 03/09/2018 10:41 AM  Other:  03/09/2018 10:41 AM  Other: 03/09/2018 10:41 AM    Scribe for Treatment Team: Johny Shearsassandra  Jarrett, LCSW 03/09/2018 10:41 AM

## 2018-03-10 NOTE — Plan of Care (Signed)
Patient took his medications reluctantly this morning. Yesterday during treatment team he was almost enthusiastic about it. He still has hallucinations, is secluded to his room and requests that meals are brought to his room. Patient states, "The voices are telling me not to take my medicines and not go done to eat with the other people." Patient went into dining room after the other patients left the area. Patient sat down at the table with one hand covering his eyes, eating with the opposite hand. Head held down only looking at his food on the table. Patient does not give eye contact with speaker when he is being spoken to. Denies headache, he states, "The voices are in there taking over." MD made aware of the auditory hallucinations. Milieu remains safe at this time. Will continue to monitor for safety.

## 2018-03-10 NOTE — Progress Notes (Signed)
Recreation Therapy Notes   Date: 03/10/2018  Time: 9:30 am   Location: Craft Room   Behavioral response: N/A   Intervention Topic: Self-Care  Discussion/Intervention: Patient did not attend group.   Clinical Observations/Feedback:  Patient did not attend group.   Gyneth Hubka LRT/CTRS        Rosabel Sermeno 03/10/2018 10:47 AM 

## 2018-03-10 NOTE — [facility] Group Notes (Signed)
LCSW Group Therapy Note   03/09/2018 1:00pm   Type of Therapy and Topic:  Group Therapy:  Overcoming Obstacles   Participation Level:  Did Not Attend   Description of Group:    In this group patients will be encouraged to explore what they see as obstacles to their own wellness and recovery. They will be guided to discuss their thoughts, feelings, and behaviors related to these obstacles. The group will process together ways to cope with barriers, with attention given to specific choices patients can make. Each patient will be challenged to identify changes they are motivated to make in order to overcome their obstacles. This group will be process-oriented, with patients participating in exploration of their own experiences as well as giving and receiving support and challenge from other group members.   Therapeutic Goals: 1. Patient will identify personal and current obstacles as they relate to admission. 2. Patient will identify barriers that currently interfere with their wellness or overcoming obstacles.  3. Patient will identify feelings, thought process and behaviors related to these barriers. 4. Patient will identify two changes they are willing to make to overcome these obstacles:      Summary of Patient Progress      Therapeutic Modalities:   Cognitive Behavioral Therapy Solution Focused Therapy Motivational Interviewing Relapse Prevention Therapy  Glennon MacSara P Janeya Deyo, LCSW 03/10/2018 2:14 PM

## 2018-03-10 NOTE — [facility] Counselor (Signed)
CSW printed out Guardianship paperwork and information on how to apply for disability for the patient. CSW will leave this information at the nurses station for the patients mother when she comes to visit him tonight.   [name] Shearsassandra Barbarajean Kinzler, MSW, Theresia MajorsLCSWA, Bridget HartshornLCASA Clinical Social Worker 03/10/2018 11:02 AM

## 2018-03-10 NOTE — [facility] Group Notes (Signed)
LCSW Group Therapy Note 03/10/2018 9:00am  Type of Therapy and Topic:  Group Therapy:  Setting Goals  Participation Level:  Did Not Attend  Description of Group: In this process group, patients discussed using strengths to work toward goals and address challenges.  Patients identified two positive things about themselves and one goal they were working on.  Patients were given the opportunity to share openly and support each other's plan for self-empowerment.  The group discussed the value of gratitude and were encouraged to have a daily reflection of positive characteristics or circumstances.  Patients were encouraged to identify a plan to utilize their strengths to work on current challenges and goals.  Therapeutic Goals 1. Patient will verbalize personal strengths/positive qualities and relate how these can assist with achieving desired personal goals 2. Patients will verbalize affirmation of peers plans for personal change and goal setting 3. Patients will explore the value of gratitude and positive focus as related to successful achievement of goals 4. Patients will verbalize a plan for regular reinforcement of personal positive qualities and circumstances.  Summary of Patient Progress:       Therapeutic Modalities Cognitive Behavioral Therapy Motivational Interviewing    [name] MacSara P Mccauley Diehl, LCSW 03/10/2018 2:15 PM

## 2018-03-10 NOTE — Plan of Care (Signed)
Patient is stable post fall , responding well, but isolating himself from any social involvement , thought process is organized , takes his meds, therapeutic support is provided , patient denies any SI/HI and no signs of AVH, sleeping long hours with out any interruptions , safety is maintained and 15 minute rounding is in progress.   Problem: Education: Goal: Ability to state activities that reduce stress will improve Outcome: Progressing   Problem: Coping: Goal: Ability to identify and develop effective coping behavior will improve Outcome: Progressing   Problem: Self-Concept: Goal: Ability to identify factors that promote anxiety will improve Outcome: Progressing Goal: Level of anxiety will decrease Outcome: Progressing Goal: Ability to modify response to factors that promote anxiety will improve Outcome: Progressing   Problem: Education: Goal: Knowledge of General Education information will improve Description Including pain rating scale, medication(s)/side effects and non-pharmacologic comfort measures Outcome: Progressing   Problem: Health Behavior/Discharge Planning: Goal: Ability to manage health-related needs will improve Outcome: Progressing   Problem: Clinical Measurements: Goal: Ability to maintain clinical measurements within normal limits will improve Outcome: Progressing Goal: Will remain free from infection Outcome: Progressing Goal: Diagnostic test results will improve Outcome: Progressing Goal: Respiratory complications will improve Outcome: Progressing Goal: Cardiovascular complication will be avoided Outcome: Progressing   Problem: Activity: Goal: Risk for activity intolerance will decrease Outcome: Progressing   Problem: Nutrition: Goal: Adequate nutrition will be maintained Outcome: Progressing   Problem: Coping: Goal: Level of anxiety will decrease Outcome: Progressing   Problem: Elimination: Goal: Will not experience complications related  to bowel motility Outcome: Progressing Goal: Will not experience complications related to urinary retention Outcome: Progressing   Problem: Pain Managment: Goal: General experience of comfort will improve Outcome: Progressing   Problem: Safety: Goal: Ability to remain free from injury will improve Outcome: Progressing   Problem: Skin Integrity: Goal: Risk for impaired skin integrity will decrease Outcome: Progressing

## 2018-03-10 NOTE — [facility] Group Notes (Signed)
03/10/2018 1PM  Type of Therapy/Topic:  Group Therapy:  Feelings about Diagnosis  Participation Level:  Did Not Attend   Description of Group:   This group will allow patients to explore their thoughts and feelings about diagnoses they have received. Patients will be guided to explore their level of understanding and acceptance of these diagnoses. Facilitator will encourage patients to process their thoughts and feelings about the reactions of others to their diagnosis and will guide patients in identifying ways to discuss their diagnosis with significant others in their lives. This group will be process-oriented, with patients participating in exploration of their own experiences, giving and receiving support, and processing challenge from other group members.   Therapeutic Goals: 1. Patient will demonstrate understanding of diagnosis as evidenced by identifying two or more symptoms of the disorder 2. Patient will be able to express two feelings regarding the diagnosis 3. Patient will demonstrate their ability to communicate their needs through discussion and/or role play  Summary of Patient Progress: Patient was encouraged and invited to attend group. Patient did not attend group. Social worker will continue to encourage group participation in the future.        Therapeutic Modalities:   Cognitive Behavioral Therapy Brief Therapy Feelings Identification    Johny ShearsCassandra  Vandana Haman, LCSW 03/10/2018 2:30 PM

## 2018-03-11 MED ORDER — LORAZEPAM 2 MG PO TABS: 2.0000 mg | ORAL_TABLET | Freq: Once | ORAL | Status: DC

## 2018-03-11 MED ORDER — LORAZEPAM 2 MG/ML IJ SOLN
2.0000 mg | Freq: Once | INTRAMUSCULAR | Status: AC
  Administered 2018-03-11: 2 mg via INTRAMUSCULAR
  Filled 2018-03-11: qty 1

## 2018-03-11 NOTE — [facility] Group Notes (Signed)
[facility] Group Notes:  (Nursing/MHT/Case Management/Adjunct)  Date:  03/11/2018  Time:  9:31 PM  Type of Therapy:  Group Therapy  Participation Level:  Did Not Attend   [name] NeighborsKeith D Arriel Victor 03/11/2018, 9:31 PM

## 2018-03-11 NOTE — [facility] Group Notes (Signed)
LCSW Group Therapy Note  03/11/2018 1:00pm  Type of Therapy/Topic:  Group Therapy:  Emotion Regulation  Participation Level:  Did Not Attend   Description of Group:    The purpose of this group is to assist patients in learning to regulate negative emotions and experience positive emotions. Patients will be guided to discuss ways in which they have been vulnerable to their negative emotions. These vulnerabilities will be juxtaposed with experiences of positive emotions or situations, and patients will be challenged to use positive emotions to combat negative ones. Special emphasis will be placed on coping with negative emotions in conflict situations, and patients will process healthy conflict resolution skills.  Therapeutic Goals: 1. Patient will identify two positive emotions or experiences to reflect on in order to balance out negative emotions 2. Patient will label two or more emotions that they find the most difficult to experience 3. Patient will demonstrate positive conflict resolution skills through discussion and/or role plays  Summary of Patient Progress: [name] [name] was invited to today's group, but chose not to attend.      Therapeutic Modalities:   Cognitive Behavioral Therapy Feelings Identification Dialectical Behavioral Therapy

## 2018-03-11 NOTE — Plan of Care (Addendum)
Patient has been standing in the room, staring at the wall, refusing to look at staff. Talking to self and touching himself bizarrely. Refusing medications and meals.

## 2018-03-11 NOTE — Plan of Care (Signed)
Patient is not coping well, refused to listen to any therapeutic regimen for  his depression, turn his back and cover his face with his palm and would not say any concerns, took his medicines and went straight  to bed , safety is maintained and 15 minute rounding continues no distress.   Problem: Education: Goal: Ability to state activities that reduce stress will improve Outcome: Progressing   Problem: Coping: Goal: Ability to identify and develop effective coping behavior will improve Outcome: Progressing   Problem: Self-Concept: Goal: Ability to identify factors that promote anxiety will improve Outcome: Progressing Goal: Level of anxiety will decrease Outcome: Progressing Goal: Ability to modify response to factors that promote anxiety will improve Outcome: Progressing   Problem: Education: Goal: Knowledge of General Education information will improve Description Including pain rating scale, medication(s)/side effects and non-pharmacologic comfort measures Outcome: Progressing   Problem: Health Behavior/Discharge Planning: Goal: Ability to manage health-related needs will improve Outcome: Progressing   Problem: Clinical Measurements: Goal: Ability to maintain clinical measurements within normal limits will improve Outcome: Progressing Goal: Will remain free from infection Outcome: Progressing Goal: Diagnostic test results will improve Outcome: Progressing Goal: Respiratory complications will improve Outcome: Progressing Goal: Cardiovascular complication will be avoided Outcome: Progressing   Problem: Activity: Goal: Risk for activity intolerance will decrease Outcome: Progressing   Problem: Nutrition: Goal: Adequate nutrition will be maintained Outcome: Progressing   Problem: Coping: Goal: Level of anxiety will decrease Outcome: Progressing   Problem: Elimination: Goal: Will not experience complications related to bowel motility Outcome: Progressing Goal:  Will not experience complications related to urinary retention Outcome: Progressing   Problem: Pain Managment: Goal: General experience of comfort will improve Outcome: Progressing   Problem: Safety: Goal: Ability to remain free from injury will improve Outcome: Progressing   Problem: Skin Integrity: Goal: Risk for impaired skin integrity will decrease Outcome: Progressing

## 2018-03-11 NOTE — Progress Notes (Signed)
[facility]  03/12/2018 10:40 AM [name] [name]  MRN:  [id number]  Subjective:    [name] [name] became catatonic last night. He did not have lunch or dinner and became mute. This has happened several times in the past when severely psychotic. He received 2 mg IM Ativan last night with improvement. Ativan is available as needed. He did have breakfast this morning but after all the patients were gone from the dining area.  I was unable to reach his mother by phone. We mailed her forms and information she requested about guardianship and disability application along with the letter of support. We strongly recommend that the seeks both.   Principal Problem: Schizoaffective disorder, bipolar type (HCC) Diagnosis:   Patient Active Problem List   Diagnosis Date Noted  . Schizoaffective disorder, bipolar type (HCC) [F25.0] 12/30/2017    Priority: High  . Diabetes (HCC) [E11.9] 03/06/2018  . Hypertension [I10] 03/06/2018  . Noncompliance [Z91.19] 12/30/2017  . Aggressive behavior of adolescent [R46.89] 10/24/2015   Total Time spent with patient: 20 minutes  Past Psychiatric History: schizophrenia  Past Medical History:  Past Medical History:  Diagnosis Date  . Asthma   . Depression   . Psychosis [facility](HCC)     Past Surgical History:  Procedure Laterality Date  . BACK SURGERY     Family History: History reviewed. No pertinent family history. Family Psychiatric  History: none Social History:  Social History   Substance and Sexual Activity  Alcohol Use No     Social History   Substance and Sexual Activity  Drug Use Yes  . Types: Marijuana    Social History   Socioeconomic History  . Marital status: Single    Spouse name: Not on file  . Number of children: Not on file  . Years of education: Not on file  . Highest education level: Not on file  Occupational History  . Not on file  Social Needs  . Financial resource strain: Not on file  . Food insecurity:    Worry:  Not on file    Inability: Not on file  . Transportation needs:    Medical: Not on file    Non-medical: Not on file  Tobacco Use  . Smoking status: Never Smoker  . Smokeless tobacco: Never Used  Substance and Sexual Activity  . Alcohol use: No  . Drug use: Yes    Types: Marijuana  . Sexual activity: Not Currently  Lifestyle  . Physical activity:    Days per week: Not on file    Minutes per session: Not on file  . Stress: Not on file  Relationships  . Social connections:    Talks on phone: Not on file    Gets together: Not on file    Attends religious service: Not on file    Active member of club or organization: Not on file    Attends meetings of clubs or organizations: Not on file    Relationship status: Not on file  Other Topics Concern  . Not on file  Social History Narrative  . Not on file   Additional Social History:                         Sleep: Fair  Appetite:  Fair  Current Medications: Current Facility-Administered Medications  Medication Dose Route Frequency Provider Last Rate Last Dose  . acetaminophen (TYLENOL) tablet 650 mg  650 mg Oral Q6H PRN [name], [name],  MD      . alum & mag hydroxide-simeth (MAALOX/MYLANTA) 200-200-20 MG/5ML suspension 30 mL  30 mL Oral Q4H PRN [name], [name], MD      . cloZAPine (CLOZARIL) tablet 300 mg  300 mg Oral QHS [name], [name], MD   300 mg at 03/10/18 2136  . divalproex (DEPAKOTE) DR tablet 500 mg  500 mg Oral Q12H [name], [name], MD   500 mg at 03/12/18 0931  . hydrOXYzine (ATARAX/VISTARIL) tablet 50 mg  50 mg Oral TID PRN [name], [name], MD      . LORazepam (ATIVAN) tablet 1 mg  1 mg Oral Q4H PRN Brendyn Mclaren B, MD       Or  . LORazepam (ATIVAN) injection 1 mg  1 mg Intramuscular Q4H PRN Curtistine Pettitt B, MD      . magnesium hydroxide (MILK OF MAGNESIA) suspension 30 mL  30 mL Oral Daily PRN [name], [name], MD      . metFORMIN (GLUCOPHAGE) tablet 500 mg  500 mg Oral Q breakfast [name], [name], MD   500 mg at 03/12/18 0933  . metoprolol tartrate (LOPRESSOR) tablet 12.5 mg  12.5 mg Oral BID [name], [name], MD   12.5 mg at 03/12/18 0931  . traZODone (DESYREL) tablet 100 mg  100 mg Oral QHS [name], [name], MD   100 mg at 03/10/18 2134    Lab Results: No results found for this or any previous visit (from the past 48 hour(s)).  Blood Alcohol level:  Lab Results  Component Value Date   ETH <10 03/06/2018   ETH <10 12/30/2017    Metabolic Disorder Labs: Lab Results  Component Value Date   HGBA1C 4.9 03/08/2018   MPG 93.93 03/08/2018   MPG 85.32 12/30/2017   No results found for: PROLACTIN Lab Results  Component Value Date   CHOL 238 (H) 03/08/2018   TRIG 69 03/08/2018   HDL 56 03/08/2018   CHOLHDL 4.3 03/08/2018   VLDL 14 03/08/2018   LDLCALC 168 (H) 03/08/2018   LDLCALC 97 12/30/2017    Physical Findings: AIMS: Facial and Oral Movements Muscles of Facial Expression: None, normal Lips and Perioral Area: None, normal Jaw: None, normal Tongue: None, normal,Extremity Movements Upper (arms, wrists, hands, fingers): None, normal Lower (legs, knees, ankles, toes): None, normal, Trunk Movements Neck, shoulders, hips: None, normal, Overall Severity Severity of abnormal movements (highest score from questions above): None, normal Incapacitation due to abnormal movements: None, normal Patient's awareness of abnormal movements (rate only patient's report): No Awareness, Dental Status Current problems with teeth and/or dentures?: No Does patient usually wear dentures?: No  CIWA:    COWS:     Musculoskeletal: Strength & Muscle Tone: within normal limits Gait & Station: normal Patient leans: N/A  Psychiatric Specialty Exam: Physical Exam  Nursing note and vitals reviewed. Psychiatric: His affect is blunt. His speech is delayed. He is slowed, withdrawn and actively hallucinating. Thought content is paranoid and delusional. Cognition and memory are impaired. He  expresses impulsivity. He is noncommunicative.    Review of Systems  Neurological: Negative.   Psychiatric/Behavioral: Positive for hallucinations.  All other systems reviewed and are negative.   Blood pressure 123/88, pulse 80, temperature 98.8 F (37.1 C), temperature source Oral, resp. rate 18, height 6' 0.44" (1.84 m), weight 86.2 kg, SpO2 100 %.Body mass index is 25.46 kg/m.  General Appearance: Casual  Eye Contact:  Good  Speech:  Blocked  Volume:  Normal  Mood:  Irritable  Affect:  Flat  Thought Process:  Goal Directed and Descriptions of Associations: Intact  Orientation:  Full (Time, Place, and Person)  Thought Content:  Delusions, Hallucinations: Auditory and Paranoid Ideation  Suicidal Thoughts:  No  Homicidal Thoughts:  No  Memory:  Immediate;   Poor Recent;   Poor Remote;   Poor  Judgement:  Poor  Insight:  Lacking  Psychomotor Activity:  Psychomotor Retardation  Concentration:  Concentration: Poor and Attention Span: Poor  Recall:  Poor  Fund of Knowledge:  Fair  Language:  Fair  Akathisia:  No  Handed:  Right  AIMS (if indicated):     Assets:  Communication Skills Desire for Improvement Financial Resources/Insurance Housing Physical Health Resilience Social Support  ADL's:  Intact  Cognition:  WNL  Sleep:  Number of Hours: 7.45     Treatment Plan Summary: Daily contact with patient to assess and evaluate symptoms and progress in treatment and Medication management   [name] [name] is a 22 year old male with a history of schizophrenia admitted for another psychotic break in the context of treatment noncompliance.  #Catatonia -Ativan 1 mg PO or IM every 4 hours PRN  #Psychosis -continue Clozapine 300 mg nightly -Depakote 500 mg BID, level in AM on 8/23 -Trazodone 150 mg nightly  #Metabolic syndrome prevention -Metformin 500 mg BID  #Tachycardia, likely from Clozapine -Metoprolol 12.5 BID  #Labs were obtaine recently  #Social -patient  would benefit from help of the guardian -patient, in my opinion, should be considered disabled  #Disposition -discharge to home with the mother -needs family meeting -needs ACT team  Kristine LineaJolanta Marley Charlot, MD 03/12/2018, 10:40 AM

## 2018-03-11 NOTE — Plan of Care (Signed)
Patient guarded and isolates in his room.Patient had breakfast after everybody left that area.Patient ate with his head down.Patient look meds with encouragement.States "I don't need medicines".Patient positive for AH.Denies SI and HI at this time.Did not eat lunch and dinner.Support and encouragement given.

## 2018-03-11 NOTE — Progress Notes (Addendum)
Patient has been standing in his room, facing the wall, talking to himself. Refusing to turn and look around. Saying that he does not want to look at anybody. Patient has just refused his Depakote saying that he will not take any medications. Per previous nursing report, patient did not eat lunch or dinner. Patient reports that he is hungry but does not want to eat. He is anxious and restless, yawning frequently. Staff continue to monitor.  2130: Patient exhibited increased anxiety and would not take his PO medications. Willing to take IM. MD was contacted and Ativan 2 mg by mouth given to patient in right Deltoid. Patient tolerated well and currently sleeping. Staff continue to monitor.

## 2018-03-11 NOTE — Progress Notes (Signed)
[facility]  03/11/2018 3:20 PM [name] [name]  MRN:  [id number]  Subjective:    [name] takes medications but refuses to interact with outside world tuned in to his hallucinations.  Attempted to call his mother to discuss SSD application and guardianship but was unable to do so.   Principal Problem: Schizoaffective disorder, bipolar type (HCC) Diagnosis:   Patient Active Problem List   Diagnosis Date Noted  . Schizoaffective disorder, bipolar type (HCC) [F25.0] 12/30/2017    Priority: High  . Diabetes (HCC) [E11.9] 03/06/2018  . Hypertension [I10] 03/06/2018  . Noncompliance [Z91.19] 12/30/2017  . Aggressive behavior of adolescent [R46.89] 10/24/2015   Total Time spent with patient: 15 minutes  Past Psychiatric History: schizophrenia  Past Medical History:  Past Medical History:  Diagnosis Date  . Asthma   . Depression   . Psychosis [facility](HCC)     Past Surgical History:  Procedure Laterality Date  . BACK SURGERY     Family History: History reviewed. No pertinent family history. Family Psychiatric  History: none Social History:  Social History   Substance and Sexual Activity  Alcohol Use No     Social History   Substance and Sexual Activity  Drug Use Yes  . Types: Marijuana    Social History   Socioeconomic History  . Marital status: Single    Spouse name: Not on file  . Number of children: Not on file  . Years of education: Not on file  . Highest education level: Not on file  Occupational History  . Not on file  Social Needs  . Financial resource strain: Not on file  . Food insecurity:    Worry: Not on file    Inability: Not on file  . Transportation needs:    Medical: Not on file    Non-medical: Not on file  Tobacco Use  . Smoking status: Never Smoker  . Smokeless tobacco: Never Used  Substance and Sexual Activity  . Alcohol use: No  . Drug use: Yes    Types: Marijuana  . Sexual activity: Not Currently  Lifestyle  . Physical  activity:    Days per week: Not on file    Minutes per session: Not on file  . Stress: Not on file  Relationships  . Social connections:    Talks on phone: Not on file    Gets together: Not on file    Attends religious service: Not on file    Active member of club or organization: Not on file    Attends meetings of clubs or organizations: Not on file    Relationship status: Not on file  Other Topics Concern  . Not on file  Social History Narrative  . Not on file   Additional Social History:                         Sleep: Fair  Appetite:  Fair  Current Medications: Current Facility-Administered Medications  Medication Dose Route Frequency Provider Last Rate Last Dose  . acetaminophen (TYLENOL) tablet 650 mg  650 mg Oral Q6H PRN [name], [name], MD      . alum & mag hydroxide-simeth (MAALOX/MYLANTA) 200-200-20 MG/5ML suspension 30 mL  30 mL Oral Q4H PRN [name], [name], MD      . cloZAPine (CLOZARIL) tablet 300 mg  300 mg Oral QHS [name], [name], MD   300 mg at 03/10/18 2136  . divalproex (DEPAKOTE) DR tablet 500  mg  500 mg Oral Q12H [name], [name], MD   500 mg at 03/11/18 [id number]  . hydrOXYzine (ATARAX/VISTARIL) tablet 50 mg  50 mg Oral TID PRN [name], [name], MD      . magnesium hydroxide (MILK OF MAGNESIA) suspension 30 mL  30 mL Oral Daily PRN [name], [name], MD      . metFORMIN (GLUCOPHAGE) tablet 500 mg  500 mg Oral Q breakfast [name], [name], MD   500 mg at 03/11/18 [id number]  . metoprolol tartrate (LOPRESSOR) tablet 12.5 mg  12.5 mg Oral BID [name], [name], MD   12.5 mg at 03/11/18 0834  . traZODone (DESYREL) tablet 100 mg  100 mg Oral QHS [name], [name], MD   100 mg at 03/10/18 2134    Lab Results: No results found for this or any previous visit (from the past 48 hour(s)).  Blood Alcohol level:  Lab Results  Component Value Date   ETH <10 03/06/2018   ETH <10 12/30/2017    Metabolic Disorder Labs: Lab Results  Component Value Date   HGBA1C 4.9  03/08/2018   MPG 93.93 03/08/2018   MPG 85.32 12/30/2017   No results found for: PROLACTIN Lab Results  Component Value Date   CHOL 238 (H) 03/08/2018   TRIG 69 03/08/2018   HDL 56 03/08/2018   CHOLHDL 4.3 03/08/2018   VLDL 14 03/08/2018   LDLCALC 168 (H) 03/08/2018   LDLCALC 97 12/30/2017    Physical Findings: AIMS: Facial and Oral Movements Muscles of Facial Expression: None, normal Lips and Perioral Area: None, normal Jaw: None, normal Tongue: None, normal,Extremity Movements Upper (arms, wrists, hands, fingers): None, normal Lower (legs, knees, ankles, toes): None, normal, Trunk Movements Neck, shoulders, hips: None, normal, Overall Severity Severity of abnormal movements (highest score from questions above): None, normal Incapacitation due to abnormal movements: None, normal Patient's awareness of abnormal movements (rate only patient's report): No Awareness, Dental Status Current problems with teeth and/or dentures?: No Does patient usually wear dentures?: No  CIWA:    COWS:     Musculoskeletal: Strength & Muscle Tone: within normal limits Gait & Station: normal Patient leans: N/A  Psychiatric Specialty Exam: Physical Exam  Nursing note and vitals reviewed. Psychiatric: His affect is blunt. His speech is delayed. He is slowed, withdrawn and actively hallucinating. Thought content is paranoid and delusional. Cognition and memory are impaired. He expresses impulsivity.    Review of Systems  Neurological: Negative.   Psychiatric/Behavioral: Positive for hallucinations.  All other systems reviewed and are negative.   Blood pressure 128/78, pulse 76, temperature 98.8 F (37.1 C), temperature source Oral, resp. rate 18, height 6' 0.44" (1.84 m), weight 86.2 kg, SpO2 100 %.Body mass index is 25.46 kg/m.  General Appearance: Casual  Eye Contact:  None  Speech:  Blocked  Volume:  Decreased  Mood:  Irritable  Affect:  Restricted  Thought Process:  Disorganized and  Descriptions of Associations: Loose  Orientation:  Full (Time, Place, and Person)  Thought Content:  Delusions, Hallucinations: Auditory and Paranoid Ideation  Suicidal Thoughts:  No  Homicidal Thoughts:  No  Memory:  Immediate;   Poor Recent;   Poor Remote;   Poor  Judgement:  Poor  Insight:  Lacking  Psychomotor Activity:  Psychomotor Retardation  Concentration:  Concentration: Poor and Attention Span: Poor  Recall:  Poor  Fund of Knowledge:  Poor  Language:  Poor  Akathisia:  No  Handed:  Right  AIMS (if indicated):  Assets:  Communication Skills Desire for Improvement Financial Resources/Insurance Housing Physical Health Resilience Social Support  ADL's:  Intact  Cognition:  WNL  Sleep:  Number of Hours: 7.3     Treatment Plan Summary: Daily contact with patient to assess and evaluate symptoms and progress in treatment and Medication management   Mr. Ponder is a 22 year old male with a history of schizophrenia admitted for another psychotic break in the context of treatment noncompliance.  #Psychosis -restarted on Clozapine 300 mg nightly -Depakote 500 mg BID, level in AM on 8/23 -Trazodone 150 mg nightly  #Metabolic syndrome prevention -Metformin 500 mg BID  #Tachycardia, likely from Clozapine -Metoprolol 12.5 BID  #Labs were obtaine recently  3Disposition -discharge to home with the mother -needs family meeting -needs AT team  Kristine Linea, MD 03/11/2018, 3:20 PM

## 2018-03-12 MED ORDER — LORAZEPAM 1 MG PO TABS
1.0000 mg | ORAL_TABLET | ORAL | Status: DC | PRN
  Administered 2018-03-12: 1 mg via ORAL
  Filled 2018-03-12: qty 1

## 2018-03-12 MED ORDER — LORAZEPAM 2 MG/ML IJ SOLN: 1.0000 mg | INTRAMUSCULAR | Status: DC | PRN

## 2018-03-12 MED ORDER — PALIPERIDONE ER 3 MG PO TB24
6.0000 mg | ORAL_TABLET | Freq: Every day | ORAL | Status: DC
  Administered 2018-03-12 – 2018-03-13 (×2): 6 mg via ORAL
  Filled 2018-03-12 (×2): qty 2

## 2018-03-12 NOTE — [facility] Group Notes (Signed)
LCSW Group Therapy Note  03/12/2018 1:00pm  Type of Therapy/Topic:  Group Therapy:  Balance in Life  Participation Level:  Did Not Attend  Description of Group:    This group will address the concept of balance and how it feels and looks when one is unbalanced. Patients will be encouraged to process areas in their lives that are out of balance and identify reasons for remaining unbalanced. Facilitators will guide patients in utilizing problem-solving interventions to address and correct the stressor making their life unbalanced. Understanding and applying boundaries will be explored and addressed for obtaining and maintaining a balanced life. Patients will be encouraged to explore ways to assertively make their unbalanced needs known to significant others in their lives, using other group members and facilitator for support and feedback.  Therapeutic Goals: 1. Patient will identify two or more emotions or situations they have that consume much of in their lives. 2. Patient will identify signs/triggers that life has become out of balance:  3. Patient will identify two ways to set boundaries in order to achieve balance in their lives:  4. Patient will demonstrate ability to communicate their needs through discussion and/or role plays  Summary of Patient Progress:      Therapeutic Modalities:   Cognitive Behavioral Therapy Solution-Focused Therapy Assertiveness Training  Isiaah Cuervo S Josephanthony Tindel, LCSW 03/12/2018 4:41 PM  

## 2018-03-12 NOTE — Progress Notes (Signed)
Patient has remained asleep and appears to be resting well. No sign of distress. Staff continue to monitor for safety.

## 2018-03-12 NOTE — Plan of Care (Signed)
Patient was covering his face with his hands while going to the day room for breakfast and dinner.Patient is not irritable and more receptive with staff today.Patient took medications,states "I like shots than pills."Patient turns his face away when staff enters the room.Support and encouragement given.

## 2018-03-12 NOTE — Progress Notes (Addendum)
[name] Progress Note  03/13/2018 9:52 AM [name] [name]  MRN:  [id number]  Subjective:    [name] is slumped in bed most of the day with his head covereds and no eye contact. He hides his face all the time. The voices tell him to avoid eye contact. He eats his meals in dining area with face covered. He reports feeling "OK". We added Invega to his regimen with a plan to transition to [place]. Patient tolerates Invega well and agrees to injectables. In general, he "prefers" injections to pills. He does not wish to see his mother as of yet. "Just tell her to pick me up." When? "ASAP."   Principal Problem: Schizoaffective disorder, bipolar type (HCC) Diagnosis:   Patient Active Problem List   Diagnosis Date Noted  . Schizoaffective disorder, bipolar type (HCC) [F25.0] 12/30/2017    Priority: High  . Diabetes (HCC) [E11.9] 03/06/2018  . Hypertension [I10] 03/06/2018  . Noncompliance [Z91.19] 12/30/2017  . Aggressive behavior of adolescent [R46.89] 10/24/2015   Total Time spent with patient: 20 minutes  Past Psychiatric History: schizophrenia  Past Medical History:  Past Medical History:  Diagnosis Date  . Asthma   . Depression   . Psychosis [facility])     Past Surgical History:  Procedure Laterality Date  . BACK SURGERY     Family History: History reviewed. No pertinent family history. Family Psychiatric  History: none Social History:  Social History   Substance and Sexual Activity  Alcohol Use No     Social History   Substance and Sexual Activity  Drug Use Yes  . Types: Marijuana    Social History   Socioeconomic History  . Marital status: Single    Spouse name: Not on file  . Number of children: Not on file  . Years of education: Not on file  . Highest education level: Not on file  Occupational History  . Not on file  Social Needs  . Financial resource strain: Not on file  . Food insecurity:    Worry: Not on file    Inability: Not on file  .  Transportation needs:    Medical: Not on file    Non-medical: Not on file  Tobacco Use  . Smoking status: Never Smoker  . Smokeless tobacco: Never Used  Substance and Sexual Activity  . Alcohol use: No  . Drug use: Yes    Types: Marijuana  . Sexual activity: Not Currently  Lifestyle  . Physical activity:    Days per week: Not on file    Minutes per session: Not on file  . Stress: Not on file  Relationships  . Social connections:    Talks on phone: Not on file    Gets together: Not on file    Attends religious service: Not on file    Active member of club or organization: Not on file    Attends meetings of clubs or organizations: Not on file    Relationship status: Not on file  Other Topics Concern  . Not on file  Social History Narrative  . Not on file   Additional Social History:                         Sleep: Fair  Appetite:  Poor  Current Medications: Current Facility-Administered Medications  Medication Dose Route Frequency Provider Last Rate Last Dose  . acetaminophen (TYLENOL) tablet 650 mg  650 mg Oral Q6H PRN [name], [name]  T, MD      . alum & mag hydroxide-simeth (MAALOX/MYLANTA) 200-200-20 MG/5ML suspension 30 mL  30 mL Oral Q4H PRN [name], [name], MD      . cloZAPine (CLOZARIL) tablet 300 mg  300 mg Oral QHS [name], [name], MD   300 mg at 03/12/18 2157  . divalproex (DEPAKOTE) DR tablet 500 mg  500 mg Oral Q12H [name], [name], MD   500 mg at 03/13/18 0800  . magnesium hydroxide (MILK OF MAGNESIA) suspension 30 mL  30 mL Oral Daily PRN [name], [name], MD      . metFORMIN (GLUCOPHAGE) tablet 500 mg  500 mg Oral Q breakfast [name], [name], MD   500 mg at 03/13/18 0759  . metoprolol tartrate (LOPRESSOR) tablet 12.5 mg  12.5 mg Oral BID [name], [name], MD   12.5 mg at 03/13/18 0759  . [START ON 03/14/2018] paliperidone (INVEGA SUSTENNA) injection 234 mg  234 mg Intramuscular Q28 days [name], MD      . Melene Muller ON 03/14/2018] paliperidone  (INVEGA) 24 hr tablet 9 mg  9 mg Oral Daily [name], MD      . traZODone (DESYREL) tablet 100 mg  100 mg Oral QHS [name], [name], MD   100 mg at 03/12/18 2158    Lab Results:  Results for orders placed or performed during the hospital encounter of 03/07/18 (from the past 48 hour(s))  Valproic acid level     Status: None   Collection Time: 03/13/18  6:45 AM  Result Value Ref Range   Valproic Acid Lvl 73 50.0 - 100.0 ug/mL    Comment: Performed at [facility], [address] [place], [place] [address]    Blood Alcohol level:  Lab Results  Component Value Date   [facility] <10 03/06/2018   ETH <10 12/30/2017    Metabolic Disorder Labs: Lab Results  Component Value Date   HGBA1C 4.9 03/08/2018   MPG 93.93 03/08/2018   MPG 85.32 12/30/2017   No results found for: PROLACTIN Lab Results  Component Value Date   CHOL 238 (H) 03/08/2018   TRIG 69 03/08/2018   HDL 56 03/08/2018   CHOLHDL 4.3 03/08/2018   VLDL 14 03/08/2018   LDLCALC 168 (H) 03/08/2018   LDLCALC 97 12/30/2017    Physical Findings: AIMS: Facial and Oral Movements Muscles of Facial Expression: None, normal Lips and Perioral Area: None, normal Jaw: None, normal Tongue: None, normal,Extremity Movements Upper (arms, wrists, hands, fingers): None, normal Lower (legs, knees, ankles, toes): None, normal, Trunk Movements Neck, shoulders, hips: None, normal, Overall Severity Severity of abnormal movements (highest score from questions above): None, normal Incapacitation due to abnormal movements: None, normal Patient's awareness of abnormal movements (rate only patient's report): No Awareness, Dental Status Current problems with teeth and/or dentures?: No Does patient usually wear dentures?: No  CIWA:    COWS:     Musculoskeletal: Strength & Muscle Tone: within normal limits Gait & Station: normal Patient leans: N/A  Psychiatric Specialty Exam: Physical Exam  Nursing note and vitals  reviewed. Psychiatric: His affect is blunt. He is withdrawn and actively hallucinating. Thought content is paranoid and delusional. Cognition and memory are impaired. He expresses impulsivity. He is noncommunicative.    Review of Systems  Neurological: Negative.   Psychiatric/Behavioral: Positive for hallucinations.  All other systems reviewed and are negative.   Blood pressure 122/77, pulse 81, temperature 98.8 F (37.1 C), temperature source Oral, resp. rate 18,  height 6' 0.44" (1.84 m), weight 86.2 kg, SpO2 100 %.Body mass index is 25.46 kg/m.  General Appearance: Fairly Groomed  Eye Contact:  Absent  Speech:  Blocked  Volume:  Decreased  Mood:  Euthymic  Affect:  Blunt  Thought Process:  Disorganized and Descriptions of Associations: Loose  Orientation:  Full (Time, Place, and Person)  Thought Content:  Delusions, Hallucinations: Auditory and Paranoid Ideation  Suicidal Thoughts:  No  Homicidal Thoughts:  No  Memory:  Immediate;   Poor Recent;   Poor Remote;   Poor  Judgement:  Poor  Insight:  Lacking  Psychomotor Activity:  Psychomotor Retardation  Concentration:  Concentration: Poor and Attention Span: Poor  Recall:  Poor  Fund of Knowledge:  Poor  Language:  Poor  Akathisia:  No  Handed:  Right  AIMS (if indicated):     Assets:  Desire for Improvement Financial Resources/Insurance Housing Physical Health Resilience Social Support  ADL's:  Intact  Cognition:  WNL  Sleep:  Number of Hours: 8     Treatment Plan Summary: Daily contact with patient to assess and evaluate symptoms and progress in treatment and Medication management   [name] [name] is a 22 year old male with a history of schizophrenia admitted for another psychotic break in the context of treatment noncompliance.  #Agitation -Zyprexa 10 mg IM PRN  #Psychosis -continue Clozapine 300 mg nightly -Depakote 500 mg BID, VPA level 73 on 8/23 -increase oral Invega to 9 mg daily -start Invega sustenna  injections 234 mg tomorrow -Trazodone 150 mg nightly  #Metabolic syndrome prevention -Metformin 500 mg BID  #Tachycardia, likely from Clozapine, resolved with treatment -Metoprolol 12.5 BID  #Labs were obtained recently  #Social -patient would benefit from help of the guardian -patient, in my opinion, should be considered disabled  #Disposition -discharge to home with the mother -needs family meeting -needs ACT team  Kristine LineaJolanta Dilynn Munroe, MD 03/13/2018, 9:52 AM

## 2018-03-12 NOTE — Progress Notes (Signed)
Recreation Therapy Notes  Date: 03/12/2018  Time: 9:30 am   Location: Craft Room   Behavioral response: N/A   Intervention Topic: Happiness  Discussion/Intervention: Patient did not attend group.   Clinical Observations/Feedback:  Patient did not attend group.   Farris Geiman LRT/CTRS        Serafin Decatur 03/12/2018 11:23 AM

## 2018-03-12 NOTE — [facility] Group Notes (Signed)
[facility] Group Notes:  (Nursing/MHT/Case Management/Adjunct)  Date:  03/12/2018  Time:  11:26 PM  Type of Therapy:  Group Therapy  Participation Level:  Did Not Attend    Jinger NeighborsKeith D Mertice Uffelman 03/12/2018, 11:26 PM

## 2018-03-13 LAB — VALPROIC ACID LEVEL: VALPROIC ACID LVL: 73 ug/mL (ref 50.0–100.0)

## 2018-03-13 LAB — CBC WITH DIFFERENTIAL/PLATELET
BASOS ABS: 0 10*3/uL (ref 0–0.1)
BASOS PCT: 0 %
EOS PCT: 0 %
Eosinophils Absolute: 0 10*3/uL (ref 0–0.7)
HCT: 45.9 % (ref 40.0–52.0)
Hemoglobin: 16 g/dL (ref 13.0–18.0)
LYMPHS PCT: 38 %
Lymphs Abs: 1.4 10*3/uL (ref 1.0–3.6)
MCH: 28.4 pg (ref 26.0–34.0)
MCHC: 34.9 g/dL (ref 32.0–36.0)
MCV: 81.6 fL (ref 80.0–100.0)
MONO ABS: 0.5 10*3/uL (ref 0.2–1.0)
Monocytes Relative: 12 %
Neutro Abs: 1.8 10*3/uL (ref 1.4–6.5)
Neutrophils Relative %: 50 %
PLATELETS: 211 10*3/uL (ref 150–440)
RBC: 5.63 MIL/uL (ref 4.40–5.90)
RDW: 12.9 % (ref 11.5–14.5)
WBC: 3.6 10*3/uL — ABNORMAL LOW (ref 3.8–10.6)

## 2018-03-13 MED ORDER — PALIPERIDONE PALMITATE ER 234 MG/1.5ML IM SUSY: 234.0000 mg | PREFILLED_SYRINGE | INTRAMUSCULAR | Status: DC

## 2018-03-13 MED ORDER — PALIPERIDONE ER 3 MG PO TB24
9.0000 mg | ORAL_TABLET | Freq: Every day | ORAL | Status: DC
  Administered 2018-03-14 – 2018-03-20 (×7): 9 mg via ORAL
  Filled 2018-03-13 (×9): qty 3

## 2018-03-13 MED ORDER — OLANZAPINE 10 MG IM SOLR: 10.0000 mg | Freq: Two times a day (BID) | INTRAMUSCULAR | Status: DC | PRN

## 2018-03-13 NOTE — Progress Notes (Signed)
Patient refused to have blood pressure obtained. Patient stated "I don't want it."

## 2018-03-13 NOTE — Tx Team (Signed)
Interdisciplinary Treatment and Diagnostic Plan Update  03/13/2018 Time of Session: 10:50am [name] [name] MRN: [id number]  Principal Diagnosis: Schizoaffective disorder, bipolar type (HCC)  Secondary Diagnoses: Principal Problem:   Schizoaffective disorder, bipolar type (HCC) Active Problems:   Noncompliance   Diabetes (HCC)   Hypertension   Current Medications:  Current Facility-Administered Medications  Medication Dose Route Frequency Provider Last Rate Last Dose  . acetaminophen (TYLENOL) tablet 650 mg  650 mg Oral Q6H PRN [name], [name], MD      . alum & mag hydroxide-simeth (MAALOX/MYLANTA) 200-200-20 MG/5ML suspension 30 mL  30 mL Oral Q4H PRN [name], [name], MD      . cloZAPine (CLOZARIL) tablet 300 mg  300 mg Oral QHS [name], [name], MD   300 mg at 03/12/18 2157  . divalproex (DEPAKOTE) DR tablet 500 mg  500 mg Oral Q12H [name], [name], MD   500 mg at 03/13/18 0800  . magnesium hydroxide (MILK OF MAGNESIA) suspension 30 mL  30 mL Oral Daily PRN [name], [name], MD      . metFORMIN (GLUCOPHAGE) tablet 500 mg  500 mg Oral Q breakfast [name], [name], MD   500 mg at 03/13/18 0759  . metoprolol tartrate (LOPRESSOR) tablet 12.5 mg  12.5 mg Oral BID [name], [name], MD   12.5 mg at 03/13/18 0759  . OLANZapine (ZYPREXA) injection 10 mg  10 mg Intramuscular BID PRN [name], [name], MD      . Melene Muller ON 03/14/2018] paliperidone (INVEGA SUSTENNA) injection 234 mg  234 mg Intramuscular Q28 days [name], [name], MD      . Melene Muller ON 03/14/2018] paliperidone (INVEGA) 24 hr tablet 9 mg  9 mg Oral Daily [name], [name], MD      . traZODone (DESYREL) tablet 100 mg  100 mg Oral QHS [name], [name], MD   100 mg at 03/12/18 2158   PTA Medications: Medications Prior to Admission  Medication Sig Dispense Refill Last Dose  . cloZAPine (CLOZARIL) 100 MG tablet Take 3 tablets (300 mg total) by mouth at bedtime. 90 tablet 1` Unknown at Unknown  . divalproex (DEPAKOTE) 500 MG DR  tablet Take 1 tablet (500 mg total) by mouth every 12 (twelve) hours. 60 tablet 1 Unknown at Unknown  . hydrOXYzine (ATARAX/VISTARIL) 50 MG tablet Take 1 tablet (50 mg total) by mouth 3 (three) times daily as needed for anxiety. 90 tablet 1 Unknown at Unknown  . metFORMIN (GLUCOPHAGE) 500 MG tablet Take 1 tablet (500 mg total) by mouth daily with breakfast. 30 tablet 1 Unknown at Unknown  . metoprolol tartrate (LOPRESSOR) 25 MG tablet Take 0.5 tablets (12.5 mg total) by mouth 2 (two) times daily. 60 tablet 1 Unknown at Unknown  . traZODone (DESYREL) 100 MG tablet Take 1 tablet (100 mg total) by mouth at bedtime as needed for sleep. 30 tablet 1 Unknown at Unknown    Patient Stressors: Financial difficulties Health problems  Patient Strengths: Average or above average intelligence Supportive family/friends  Treatment Modalities: Medication Management, Group therapy, Case management,  1 to 1 session with clinician, Psychoeducation, Recreational therapy.   Physician Treatment Plan for Primary Diagnosis: Schizoaffective disorder, bipolar type (HCC) Long Term Goal(s): Improvement in symptoms so as ready for discharge Improvement in symptoms so as ready for discharge   Short Term Goals: Ability to identify changes in lifestyle to reduce recurrence of condition will improve Ability to verbalize feelings will improve Ability to disclose and discuss suicidal ideas Ability  to demonstrate self-control will improve Ability to identify and develop effective coping behaviors will improve Ability to maintain clinical measurements within normal limits will improve Compliance with prescribed medications will improve Ability to identify triggers associated with substance abuse/mental health issues will improve Ability to identify changes in lifestyle to reduce recurrence of condition will improve Ability to verbalize feelings will improve Ability to disclose and discuss suicidal ideas Ability to  demonstrate self-control will improve Ability to identify and develop effective coping behaviors will improve Ability to maintain clinical measurements within normal limits will improve Compliance with prescribed medications will improve Ability to identify triggers associated with substance abuse/mental health issues will improve  Medication Management: Evaluate patient's response, side effects, and tolerance of medication regimen.  Therapeutic Interventions: 1 to 1 sessions, Unit Group sessions and Medication administration.  Evaluation of Outcomes: Not Progressing  Physician Treatment Plan for Secondary Diagnosis: Principal Problem:   Schizoaffective disorder, bipolar type (HCC) Active Problems:   Noncompliance   Diabetes (HCC)   Hypertension  Long Term Goal(s): Improvement in symptoms so as ready for discharge Improvement in symptoms so as ready for discharge   Short Term Goals: Ability to identify changes in lifestyle to reduce recurrence of condition will improve Ability to verbalize feelings will improve Ability to disclose and discuss suicidal ideas Ability to demonstrate self-control will improve Ability to identify and develop effective coping behaviors will improve Ability to maintain clinical measurements within normal limits will improve Compliance with prescribed medications will improve Ability to identify triggers associated with substance abuse/mental health issues will improve Ability to identify changes in lifestyle to reduce recurrence of condition will improve Ability to verbalize feelings will improve Ability to disclose and discuss suicidal ideas Ability to demonstrate self-control will improve Ability to identify and develop effective coping behaviors will improve Ability to maintain clinical measurements within normal limits will improve Compliance with prescribed medications will improve Ability to identify triggers associated with substance abuse/mental  health issues will improve     Medication Management: Evaluate patient's response, side effects, and tolerance of medication regimen.  Therapeutic Interventions: 1 to 1 sessions, Unit Group sessions and Medication administration.  Evaluation of Outcomes: Not Progressing   RN Treatment Plan for Primary Diagnosis: Schizoaffective disorder, bipolar type (HCC) Long Term Goal(s): Knowledge of disease and therapeutic regimen to maintain health will improve  Short Term Goals: Ability to remain free from injury will improve, Ability to demonstrate self-control, Ability to identify and develop effective coping behaviors will improve and Compliance with prescribed medications will improve  Medication Management: RN will administer medications as ordered by provider, will assess and evaluate patient's response and provide education to patient for prescribed medication. RN will report any adverse and/or side effects to prescribing provider.  Therapeutic Interventions: 1 on 1 counseling sessions, Psychoeducation, Medication administration, Evaluate responses to treatment, Monitor vital signs and CBGs as ordered, Perform/monitor CIWA, COWS, AIMS and Fall Risk screenings as ordered, Perform wound care treatments as ordered.  Evaluation of Outcomes: Not Progressing   LCSW Treatment Plan for Primary Diagnosis: Schizoaffective disorder, bipolar type (HCC) Long Term Goal(s): Safe transition to appropriate next level of care at discharge, Engage patient in therapeutic group addressing interpersonal concerns.  Short Term Goals: Engage patient in aftercare planning with referrals and resources, Increase social support, Identify triggers associated with mental health/substance abuse issues and Increase skills for wellness and recovery  Therapeutic Interventions: Assess for all discharge needs, 1 to 1 time with Social worker, Explore available resources and support  systems, Assess for adequacy in community support  network, Educate family and significant other(s) on suicide prevention, Complete Psychosocial Assessment, Interpersonal group therapy.  Evaluation of Outcomes: Not Progressing   Progress in Treatment: Attending groups: No. Participating in groups: No. Taking medication as prescribed: Yes. Toleration medication: Yes. Family/Significant other contact made: No, will contact:  Patient refused Patient understands diagnosis: Yes. Discussing patient identified problems/goals with staff: Yes. Medical problems stabilized or resolved: Yes. Denies suicidal/homicidal ideation: Yes. Issues/concerns per patient self-inventory: No. Other:   New problem(s) identified: No, Describe:  None  New Short Term/Long Term Goal(s): "To get back on medications and get better."  Patient Goals:  "To get back on medications and get better."  Discharge Plan or Barriers: To return back to his mothers house and follow up with outpatient treatment.  Reason for Continuation of Hospitalization: Medication stabilization  Estimated Length of Stay: 7 days    Attendees: Patient:  03/13/2018 11:01 AM  Physician: Dr. Cecil CrankerPuciulowska, MD 03/13/2018 11:01 AM  Nursing: Addison NaegeliSlade Reynolds, RN 03/13/2018 11:01 AM  RN Care Manager: 03/13/2018 11:01 AM  Social Worker: Johny Shearsassandra Geron Mulford, LCSWA 03/13/2018 11:01 AM  Recreational Therapist:  03/13/2018 11:01 AM  Other: Huey RomansSonya Carter, LCSW 03/13/2018 11:01 AM  Other: Damian LeavellJohn Mullins, [place] 03/13/2018 11:01 AM  Other: 03/13/2018 11:01 AM    Scribe for Treatment Team: Johny Shearsassandra  Ryon Layton, LCSW 03/13/2018 11:01 AM

## 2018-03-13 NOTE — Progress Notes (Signed)
[facility] MD Progress Note  03/14/2018 2:30 PM [name] [name]  MRN:  [id number]  Subjective:    [name] [name] is in bed, head turned to the wall. When at lunch, covers his eyes with both hands. This is in response to the voices "no eye contact". Yesterday, refused to see his friend [name] [name] who came to read the Bible together. He wants to be discharged "now". Takes medications and is much interested in injectable medications but I believe he means PRNs.   Principal Problem: Schizoaffective disorder, bipolar type (HCC) Diagnosis:   Patient Active Problem List   Diagnosis Date Noted  . Schizoaffective disorder, bipolar type (HCC) [F25.0] 12/30/2017    Priority: High  . Diabetes (HCC) [E11.9] 03/06/2018  . Hypertension [I10] 03/06/2018  . Noncompliance [Z91.19] 12/30/2017  . Aggressive behavior of adolescent [R46.89] 10/24/2015   Total Time spent with patient: 15 minutes  Past Psychiatric History: schizophrenia  Past Medical History:  Past Medical History:  Diagnosis Date  . Asthma   . Depression   . Psychosis [facility](HCC)     Past Surgical History:  Procedure Laterality Date  . BACK SURGERY     Family History: History reviewed. No pertinent family history. Family Psychiatric  History: none Social History:  Social History   Substance and Sexual Activity  Alcohol Use No     Social History   Substance and Sexual Activity  Drug Use Yes  . Types: Marijuana    Social History   Socioeconomic History  . Marital status: Single    Spouse name: Not on file  . Number of children: Not on file  . Years of education: Not on file  . Highest education level: Not on file  Occupational History  . Not on file  Social Needs  . Financial resource strain: Not on file  . Food insecurity:    Worry: Not on file    Inability: Not on file  . Transportation needs:    Medical: Not on file    Non-medical: Not on file  Tobacco Use  . Smoking status: Never Smoker  . Smokeless tobacco: Never Used   Substance and Sexual Activity  . Alcohol use: No  . Drug use: Yes    Types: Marijuana  . Sexual activity: Not Currently  Lifestyle  . Physical activity:    Days per week: Not on file    Minutes per session: Not on file  . Stress: Not on file  Relationships  . Social connections:    Talks on phone: Not on file    Gets together: Not on file    Attends religious service: Not on file    Active member of club or organization: Not on file    Attends meetings of clubs or organizations: Not on file    Relationship status: Not on file  Other Topics Concern  . Not on file  Social History Narrative  . Not on file   Additional Social History:                         Sleep: Fair  Appetite:  Fair  Current Medications: Current Facility-Administered Medications  Medication Dose Route Frequency Provider Last Rate Last Dose  . acetaminophen (TYLENOL) tablet 650 mg  650 mg Oral Q6H PRN [name], [name], MD      . alum & mag hydroxide-simeth (MAALOX/MYLANTA) 200-200-20 MG/5ML suspension 30 mL  30 mL Oral Q4H PRN [name], [name], MD      .  cloZAPine (CLOZARIL) tablet 300 mg  300 mg Oral QHS [name], [name], MD   300 mg at 03/13/18 2130  . divalproex (DEPAKOTE) DR tablet 500 mg  500 mg Oral Q12H [name], [name], MD   500 mg at 03/14/18 0749  . magnesium hydroxide (MILK OF MAGNESIA) suspension 30 mL  30 mL Oral Daily PRN [name], [name], MD      . metFORMIN (GLUCOPHAGE) tablet 500 mg  500 mg Oral Q breakfast [name], [name], MD   500 mg at 03/14/18 0749  . metoprolol tartrate (LOPRESSOR) tablet 12.5 mg  12.5 mg Oral BID [name], [name], MD   12.5 mg at 03/14/18 0749  . OLANZapine (ZYPREXA) injection 10 mg  10 mg Intramuscular BID PRN Desta Bujak B, MD      . paliperidone (INVEGA) 24 hr tablet 9 mg  9 mg Oral Daily Stepheny Canal B, MD   9 mg at 03/14/18 0749  . traZODone (DESYREL) tablet 100 mg  100 mg Oral QHS [name], [name], MD   100 mg at 03/13/18 2131    Lab  Results:  Results for orders placed or performed during the hospital encounter of 03/07/18 (from the past 48 hour(s))  Valproic acid level     Status: None   Collection Time: 03/13/18  6:45 AM  Result Value Ref Range   Valproic Acid Lvl 73 50.0 - 100.0 ug/mL    Comment: Performed at [facility], [address] [place], [place] [address]  CBC with Differential/Platelet     Status: Abnormal   Collection Time: 03/13/18 10:33 AM  Result Value Ref Range   WBC 3.6 (L) 3.8 - 10.6 K/uL   RBC 5.63 4.40 - 5.90 MIL/uL   Hemoglobin 16.0 13.0 - 18.0 g/dL   HCT 57.8 46.9 - 62.9 %   MCV 81.6 80.0 - 100.0 fL   MCH 28.4 26.0 - 34.0 pg   MCHC 34.9 32.0 - 36.0 g/dL   RDW 52.8 41.3 - 24.4 %   Platelets 211 150 - 440 K/uL   Neutrophils Relative % 50 %   Neutro Abs 1.8 1.4 - 6.5 K/uL   Lymphocytes Relative 38 %   Lymphs Abs 1.4 1.0 - 3.6 K/uL   Monocytes Relative 12 %   Monocytes Absolute 0.5 0.2 - 1.0 K/uL   Eosinophils Relative 0 %   Eosinophils Absolute 0.0 0 - 0.7 K/uL   Basophils Relative 0 %   Basophils Absolute 0.0 0 - 0.1 K/uL    Comment: Performed at [facility], [address] [place], [place] [address]    Blood Alcohol level:  Lab Results  Component Value Date   [facility] <10 03/06/2018   ETH <10 12/30/2017    Metabolic Disorder Labs: Lab Results  Component Value Date   HGBA1C 4.9 03/08/2018   MPG 93.93 03/08/2018   MPG 85.32 12/30/2017   No results found for: PROLACTIN Lab Results  Component Value Date   CHOL 238 (H) 03/08/2018   TRIG 69 03/08/2018   HDL 56 03/08/2018   CHOLHDL 4.3 03/08/2018   VLDL 14 03/08/2018   LDLCALC 168 (H) 03/08/2018   LDLCALC 97 12/30/2017    Physical Findings: AIMS: Facial and Oral Movements Muscles of Facial Expression: None, normal Lips and Perioral Area: None, normal Jaw: None, normal Tongue: None, normal,Extremity Movements Upper (arms, wrists, hands, fingers): None, normal Lower (legs, knees, ankles, toes): None,  normal, Trunk Movements Neck, shoulders, hips: None, normal, Overall Severity Severity  of abnormal movements (highest score from questions above): None, normal Incapacitation due to abnormal movements: None, normal Patient's awareness of abnormal movements (rate only patient's report): No Awareness, Dental Status Current problems with teeth and/or dentures?: No Does patient usually wear dentures?: No  CIWA:    COWS:     Musculoskeletal: Strength & Muscle Tone: within normal limits Gait & Station: normal Patient leans: N/A  Psychiatric Specialty Exam: Physical Exam  Nursing note and vitals reviewed. Psychiatric: His affect is blunt and inappropriate. His speech is delayed. He is withdrawn and actively hallucinating. Thought content is paranoid and delusional. Cognition and memory are impaired. He expresses impulsivity.    Review of Systems  Neurological: Negative.   Psychiatric/Behavioral: Positive for hallucinations.  All other systems reviewed and are negative.   Blood pressure 121/79, pulse 67, temperature 98.8 F (37.1 C), temperature source Oral, resp. rate 18, height 6' 0.44" (1.84 m), weight 86.2 kg, SpO2 100 %.Body mass index is 25.46 kg/m.  General Appearance: Casual  Eye Contact:  Absent  Speech:  Slow  Volume:  Decreased  Mood:  Euthymic  Affect:  Blunt  Thought Process:  Goal Directed and Descriptions of Associations: Intact  Orientation:  Full (Time, Place, and Person)  Thought Content:  Delusions, Hallucinations: Auditory and Paranoid Ideation  Suicidal Thoughts:  No  Homicidal Thoughts:  No  Memory:  Immediate;   Fair Recent;   Fair Remote;   Fair  Judgement:  Poor  Insight:  Lacking  Psychomotor Activity:  Psychomotor Retardation  Concentration:  Concentration: Poor and Attention Span: Poor  Recall:  Poor  Fund of Knowledge:  Poor  Language:  Poor  Akathisia:  No  Handed:  Right  AIMS (if indicated):     Assets:  Communication Skills Desire for  Improvement Financial Resources/Insurance Housing Physical Health Resilience Social Support  ADL's:  Intact  Cognition:  WNL  Sleep:  Number of Hours: 8     Treatment Plan Summary: Daily contact with patient to assess and evaluate symptoms and progress in treatment and Medication management   [name] [name] is a 22 year old male with a history of schizophrenia admitted for another psychotic break in the context of treatment noncompliance.  #Agitation -Zyprexa 10 mg IM PRN  #Psychosis -continueClozapine 300 mg nightly -Depakote 500 mg BID, VPA level 73 on 8/23 -increase oral Invega to 9 mg daily -start Invega sustenna injections 234 mg tomorrow -Trazodone 150 mg nightly  #Metabolic syndrome prevention -Metformin 500 mg BID  #Tachycardia, likely from Clozapine, resolved with treatment -Metoprolol 12.5 BID  #Labs were obtained recently  #Social -patient would benefit from help of the guardian -patient, in my opinion, should be considered disabled  #Disposition -discharge to home with the mother -needs family meeting -needs ACT team  Kristine Linea, MD 03/14/2018, 2:30 PM

## 2018-03-13 NOTE — [facility] Group Notes (Signed)
03/13/2018 1PM  Type of Therapy and Topic:  Group Therapy:  Feelings around Relapse and Recovery  Participation Level:  Did Not Attend   Description of Group:    Patients in this group will discuss emotions they experience before and after a relapse. They will process how experiencing these feelings, or avoidance of experiencing them, relates to having a relapse. Facilitator will guide patients to explore emotions they have related to recovery. Patients will be encouraged to process which emotions are more powerful. They will be guided to discuss the emotional reaction significant others in their lives may have to patients' relapse or recovery. Patients will be assisted in exploring ways to respond to the emotions of others without this contributing to a relapse.  Therapeutic Goals: 1. Patient will identify two or more emotions that lead to a relapse for them 2. Patient will identify two emotions that result when they relapse 3. Patient will identify two emotions related to recovery 4. Patient will demonstrate ability to communicate their needs through discussion and/or role plays   Summary of Patient Progress: Patient was encouraged and invited to attend group. Patient did not attend group. Social worker will continue to encourage group participation in the future.      Therapeutic Modalities:   Cognitive Behavioral Therapy Solution-Focused Therapy Assertiveness Training Relapse Prevention Therapy   Ranjit Ashurst, LCSW 03/13/2018 2:30 PM    

## 2018-03-13 NOTE — Plan of Care (Signed)
  Problem: Coping: Goal: Level of anxiety will decrease Outcome: Progressing  Patient appears less anxious but apprehensive and isolates to self.

## 2018-03-13 NOTE — [facility] Group Notes (Signed)
[facility] Group Notes:  (Nursing/MHT/Case Management/Adjunct)  Date:  03/13/2018  Time:  11:16 PM  Type of Therapy:  Group Therapy  Participation Level:  Did Not Attend   Evita Merida D Keigan Girten 03/13/2018, 11:16 PM 

## 2018-03-13 NOTE — Consult Note (Signed)
MEDICATION RELATED CONSULT NOTE - INITIAL   Pharmacy Consult for Clozapine REMs reporting and Lab Monitoring  Indication: Schizoaffective disorder   Labs: Recent Labs    03/13/18 1033  WBC 3.6*  HGB 16.0  HCT 45.9  PLT 211   Estimated Creatinine Clearance: 127 mL/min (by C-G formula based on SCr of 1.04 mg/dL).   Microbiology: No results found for this or any previous visit (from the past 720 hour(s)).  Medical History: Past Medical History:  Diagnosis Date  . Asthma   . Depression   . Psychosis [facility](HCC)     Assessment: Pharmacy consulted for clozapine lab monitoring and REMs Program Reporting in 22 yo male with PMH of Schizoaffective disorder. Patient prescribed Clozapine 300mg  PTA.   Plan:  8/23 ANC: 1800. Lab reported to online Clozapine REMs Program. Patient is eligible to receive clozapine with weekly lab monitoring.  Next CBC with Diff 8/30   Stormy CardKatsoudas,Quintasia Theroux K, Lane County HospitalRPH Clinical Pharmacist 03/13/2018 2:14 PM

## 2018-03-13 NOTE — Plan of Care (Signed)
Patient denies SI, patient reports auditory hallucinations. Patient interaction is minimal. Patient is paranoid, will not make eye contact. Patient is flat in affect. Patient denies any symptoms at this time. Patient remains isolated in room, is in milieu for meals. Patient has no interaction with peers. Patient's safety is maintained this shift.    Problem: Education: Goal: Ability to state activities that reduce stress will improve Outcome: Not Progressing   Problem: Coping: Goal: Ability to identify and develop effective coping behavior will improve Outcome: Not Progressing   Problem: Self-Concept: Goal: Ability to identify factors that promote anxiety will improve Outcome: Not Progressing Goal: Ability to modify response to factors that promote anxiety will improve Outcome: Not Progressing   Problem: Education: Goal: Knowledge of General Education information will improve Description Including pain rating scale, medication(s)/side effects and non-pharmacologic comfort measures Outcome: Not Progressing   Problem: Coping: Goal: Level of anxiety will decrease Outcome: Not Progressing

## 2018-03-13 NOTE — Progress Notes (Signed)
Patient is alert and oriented x 3 with periods of confusion to situation, he isolates to his room avoiding staff and peers, and was noted facing the wall. Patient refused to turn his back to staff,  he had his hands covering his whole entire face. Writer asked if patient would put his hands down he politely answered " No the voices told me not look at anybody." patient was encouraged to come out for group and evening snack but he refused.  Patient appears less anxious and eventually he decided to come out and get medication with his eyes closed. 15 minutes safety checks maintained will continue to closely monitor.

## 2018-03-14 NOTE — Plan of Care (Signed)
Patient did not complete a self inventory today. Patient denies SI/HI, patient endorses auditory hallucinations. Patient describes hallucinations as whispers and voices that command him to not to make eye contact. Patient isolates to room and only intermittently leaves room for meals. Patient is minimal in interaction and forwards little during assessment.    Problem: Safety: Goal: Ability to remain free from injury will improve Outcome: Progressing   Problem: Education: Goal: Ability to state activities that reduce stress will improve Outcome: Not Progressing   Problem: Coping: Goal: Ability to identify and develop effective coping behavior will improve Outcome: Not Progressing   Problem: Self-Concept: Goal: Ability to identify factors that promote anxiety will improve Outcome: Not Progressing Goal: Level of anxiety will decrease Outcome: Not Progressing

## 2018-03-14 NOTE — Progress Notes (Signed)
[facility] MD Progress Note  03/15/2018 11:46 AM Parminder TYLEK BONEY  MRN:  [id number]  Subjective:   Mr. Bienvenue is still very psychotic: paranoid, delusional and actively hallucinating, and bizarre walking around with his eyes covered with both hands to avoid eye contact. He denies any symptoms whatsoever and requests discharge "now".   He has been asking for "injections" which we took initially as consent for long acting antipsychotic. He doe not refuse them but what he means is and injection that would help right away like the Ativan.   Principal Problem: Schizoaffective disorder, bipolar type (HCC) Diagnosis:   Patient Active Problem List   Diagnosis Date Noted  . Schizoaffective disorder, bipolar type (HCC) [F25.0] 12/30/2017    Priority: High  . Diabetes (HCC) [E11.9] 03/06/2018  . Hypertension [I10] 03/06/2018  . Noncompliance [Z91.19] 12/30/2017  . Aggressive behavior of adolescent [R46.89] 10/24/2015   Total Time spent with patient: 20 minutes  Past Psychiatric History: schizophrenia  Past Medical History:  Past Medical History:  Diagnosis Date  . Asthma   . Depression   . Psychosis [facility])     Past Surgical History:  Procedure Laterality Date  . BACK SURGERY     Family History: History reviewed. No pertinent family history. Family Psychiatric  History: none Social History:  Social History   Substance and Sexual Activity  Alcohol Use No     Social History   Substance and Sexual Activity  Drug Use Yes  . Types: Marijuana    Social History   Socioeconomic History  . Marital status: Single    Spouse name: Not on file  . Number of children: Not on file  . Years of education: Not on file  . Highest education level: Not on file  Occupational History  . Not on file  Social Needs  . Financial resource strain: Not on file  . Food insecurity:    Worry: Not on file    Inability: Not on file  . Transportation needs:    Medical: Not on file    Non-medical: Not on  file  Tobacco Use  . Smoking status: Never Smoker  . Smokeless tobacco: Never Used  Substance and Sexual Activity  . Alcohol use: No  . Drug use: Yes    Types: Marijuana  . Sexual activity: Not Currently  Lifestyle  . Physical activity:    Days per week: Not on file    Minutes per session: Not on file  . Stress: Not on file  Relationships  . Social connections:    Talks on phone: Not on file    Gets together: Not on file    Attends religious service: Not on file    Active member of club or organization: Not on file    Attends meetings of clubs or organizations: Not on file    Relationship status: Not on file  Other Topics Concern  . Not on file  Social History Narrative  . Not on file   Additional Social History:                         Sleep: Fair  Appetite:  Fair  Current Medications: Current Facility-Administered Medications  Medication Dose Route Frequency Provider Last Rate Last Dose  . acetaminophen (TYLENOL) tablet 650 mg  650 mg Oral Q6H PRN [name], [name], MD      . alum & mag hydroxide-simeth (MAALOX/MYLANTA) 200-200-20 MG/5ML suspension 30 mL  30 mL Oral Q4H PRN  [name], [name], MD      . cloZAPine (CLOZARIL) tablet 300 mg  300 mg Oral QHS [name], [name], MD   300 mg at 03/14/18 2219  . divalproex (DEPAKOTE) DR tablet 500 mg  500 mg Oral Q12H [name], [name], MD   500 mg at 03/15/18 0804  . magnesium hydroxide (MILK OF MAGNESIA) suspension 30 mL  30 mL Oral Daily PRN [name], [name], MD      . metFORMIN (GLUCOPHAGE) tablet 500 mg  500 mg Oral Q breakfast [name], [name], MD   500 mg at 03/15/18 0804  . metoprolol tartrate (LOPRESSOR) tablet 12.5 mg  12.5 mg Oral BID [name], [name], MD   12.5 mg at 03/14/18 1618  . OLANZapine (ZYPREXA) injection 10 mg  10 mg Intramuscular BID PRN Peri Kreft B, MD      . paliperidone (INVEGA) 24 hr tablet 9 mg  9 mg Oral Daily Vetta Couzens B, MD   9 mg at 03/15/18 0804  . traZODone (DESYREL) tablet  100 mg  100 mg Oral QHS [name], [name], MD   100 mg at 03/14/18 2219    Lab Results:  No results found for this or any previous visit (from the past 48 hour(s)).  Blood Alcohol level:  Lab Results  Component Value Date   ETH <10 03/06/2018   ETH <10 12/30/2017    Metabolic Disorder Labs: Lab Results  Component Value Date   HGBA1C 4.9 03/08/2018   MPG 93.93 03/08/2018   MPG 85.32 12/30/2017   No results found for: PROLACTIN Lab Results  Component Value Date   CHOL 238 (H) 03/08/2018   TRIG 69 03/08/2018   HDL 56 03/08/2018   CHOLHDL 4.3 03/08/2018   VLDL 14 03/08/2018   LDLCALC 168 (H) 03/08/2018   LDLCALC 97 12/30/2017    Physical Findings: AIMS: Facial and Oral Movements Muscles of Facial Expression: None, normal Lips and Perioral Area: None, normal Jaw: None, normal Tongue: None, normal,Extremity Movements Upper (arms, wrists, hands, fingers): None, normal Lower (legs, knees, ankles, toes): None, normal, Trunk Movements Neck, shoulders, hips: None, normal, Overall Severity Severity of abnormal movements (highest score from questions above): None, normal Incapacitation due to abnormal movements: None, normal Patient's awareness of abnormal movements (rate only patient's report): No Awareness, Dental Status Current problems with teeth and/or dentures?: No Does patient usually wear dentures?: No  CIWA:    COWS:     Musculoskeletal: Strength & Muscle Tone: within normal limits Gait & Station: normal Patient leans: N/A  Psychiatric Specialty Exam: Physical Exam  Nursing note and vitals reviewed. Psychiatric: His affect is blunt. His speech is delayed. He is withdrawn and actively hallucinating. Thought content is paranoid and delusional. Cognition and memory are impaired. He expresses impulsivity.    Review of Systems  Neurological: Negative.   Psychiatric/Behavioral: Positive for hallucinations.  All other systems reviewed and are negative.   Blood  pressure 92/60, pulse 68, temperature 98.8 F (37.1 C), temperature source Oral, resp. rate 18, height 6' 0.44" (1.84 m), weight 86.2 kg, SpO2 100 %.Body mass index is 25.46 kg/m.  General Appearance: Fairly Groomed  Eye Contact:  Absent  Speech:  Blocked  Volume:  Decreased  Mood:  Dysphoric  Affect:  Blunt  Thought Process:  Disorganized, Irrelevant and Descriptions of Associations: Tangential  Orientation:  Full (Time, Place, and Person)  Thought Content:  Delusions, Hallucinations: Auditory and Paranoid Ideation  Suicidal Thoughts:  No  Homicidal Thoughts:  No  Memory:  Immediate;   Poor Recent;   Poor Remote;   Poor  Judgement:  Poor  Insight:  Lacking  Psychomotor Activity:  Psychomotor Retardation  Concentration:  Concentration: Poor and Attention Span: Poor  Recall:  Poor  Fund of Knowledge:  Poor  Language:  Poor  Akathisia:  No  Handed:  Right  AIMS (if indicated):     Assets:  Communication Skills Desire for Improvement Financial Resources/Insurance Housing Physical Health Resilience Social Support  ADL's:  Intact  Cognition:  WNL  Sleep:  Number of Hours: 6.5     Treatment Plan Summary: Daily contact with patient to assess and evaluate symptoms and progress in treatment and Medication management   Mr. Rossano is a 23 year old male with a history of schizophrenia admitted for another psychotic break in the context of treatment noncompliance.  #Agitation -Zyprexa 10 mg IM PRN  #Psychosis -continueClozapine 300 mg nightly -Depakote 500 mg BID,VPA level 73on 8/23 -increase oral Invega to 9 mg daily -we hold Invega sustenna injections while watching ANC drop -Trazodone 150 mg nightly  #Metabolic syndrome prevention -Metformin 500 mg BID  #Tachycardia, likely from Clozapine, resolved with treatment -Metoprolol 12.5 BID  #Labs were obtainedrecently  #Social -patient would benefit from help of the guardian -patient, in my opinion, should  be considered disabled  #Disposition -discharge to home with the mother -needs family meeting -needs ACT team  Kristine Linea, MD 03/15/2018, 11:46 AM

## 2018-03-14 NOTE — Progress Notes (Signed)
Patient is alert and oriented x 4, denies SI/HI/AVH but noted responding to internal stimuli, patient during interaction in his room faced the wall with his eyes covered. Patient appears less anxious nor apprehensive, thoughts are organized and coherent, speech is soft and very polite with staff, affect is labile no distress noted,.5 minutes safety checks maintained will continue to monitor.

## 2018-03-14 NOTE — [facility] Group Notes (Signed)
[facility] LCSW Group Therapy  03/14/2018 1 pm  Group Therapy  Participation Level:  Did Not Attend   Dontae Minerva LCSW [phone]  

## 2018-03-14 NOTE — Plan of Care (Signed)
  Problem: Education: Goal: Ability to state activities that reduce stress will improve Outcome: Progressing  Patient stated he is less stressed, currently isolates to self.

## 2018-03-15 NOTE — Plan of Care (Signed)
Patient did not completed a self inventory today. Patient denies SI/HI. Patient endorses auditory hallucinations. Patient refuses to make any eye contact. Patient covers face with hands. Patient is able to have coherent conversations and is oriented appropriately. Patient is cooperative with treatment and is pleasant. Patient denies any anxiety or depression. Patient still isolates to room. Patient's safety is maintained on the unit.   Problem: Safety: Goal: Ability to remain free from injury will improve Outcome: Progressing   Problem: Education: Goal: Ability to state activities that reduce stress will improve Outcome: Not Progressing   Problem: Coping: Goal: Ability to identify and develop effective coping behavior will improve Outcome: Not Progressing   Problem: Self-Concept: Goal: Level of anxiety will decrease Outcome: Not Progressing   Problem: Education: Goal: Knowledge of General Education information will improve Description Including pain rating scale, medication(s)/side effects and non-pharmacologic comfort measures Outcome: Not Progressing   Problem: Clinical Measurements: Goal: Ability to maintain clinical measurements within normal limits will improve Outcome: Not Progressing

## 2018-03-15 NOTE — Progress Notes (Signed)
Patient is alert and oriented x 4, denies SI/HI/AVH but he isolates to room and responding to internal stimuli. Patient's thoughts are organized and coherent, he is not interacting with peers and staff appropriately. Patient was given support and encouraged to attend evening wrap up group but he refused to go to group. No distress noted, 15 minutes safety maintained will continue to monitor. ,

## 2018-03-15 NOTE — Plan of Care (Signed)
  Problem: Education: Goal: Ability to state activities that reduce stress will improve Outcome: Not Progressing   Problem: Coping: Goal: Ability to identify and develop effective coping behavior will improve Outcome: Not Progressing   Problem: Self-Concept: Goal: Ability to identify factors that promote anxiety will improve Outcome: Not Progressing Goal: Level of anxiety will decrease Outcome: Not Progressing Goal: Ability to modify response to factors that promote anxiety will improve Outcome: Not Progressing   

## 2018-03-15 NOTE — [facility] Group Notes (Signed)
[facility] LCSW Group Therapy  8/252019 1 pm  Type of Therapy:  Group Therapy:Open discussion and Promoting self care  Participation Level:  Did not attend  Geraldo Haris LCSW [phone] 

## 2018-03-15 NOTE — Progress Notes (Signed)
Patient isolative to self and room. Would not give writer eye contact, would not even turn to Arboriculturistface writer. Pleasant voice. Denies all. Patient refused medications.  Encouraged patient to come out of room and to take medications. Patient declined. Remains safe on unit with q 15 min checks.

## 2018-03-15 NOTE — [facility] Group Notes (Signed)
[facility] Group Notes:  (Nursing/MHT/Case Management/Adjunct)  Date:  03/15/2018  Time:  9:32 PM  Type of Therapy:  Group Therapy  Participation Level:  Did Not Attend  Summary of Progress/Problems:  [name] NeerJackie L [name] 03/15/2018, 9:32 PM

## 2018-03-15 NOTE — Plan of Care (Signed)
  Problem: Education: Goal: Ability to state activities that reduce stress will improve Outcome: Progressing  Patient appears less anxious.  

## 2018-03-15 NOTE — Progress Notes (Signed)
[facility] MD Progress Note  03/16/2018 11:48 AM [name] [name]  MRN:  [id number]  Subjective:   [name] [name] came to dining area for lunch today, ate quickly and retrieved to his room. This time, he did not cover his eyes. No he denies any symptoms whatsoever and wants to be discharge. Accepts medications and tolerates them well. Adequate grooming. Good night sleep. No somatic complaints. No group participation.   Principal Problem: Schizoaffective disorder, bipolar type (HCC) Diagnosis:   Patient Active Problem List   Diagnosis Date Noted  . Schizoaffective disorder, bipolar type (HCC) [F25.0] 12/30/2017    Priority: High  . Diabetes (HCC) [E11.9] 03/06/2018  . Hypertension [I10] 03/06/2018  . Noncompliance [Z91.19] 12/30/2017  . Aggressive behavior of adolescent [R46.89] 10/24/2015   Total Time spent with patient: 20 minutes  Past Psychiatric History: schizophrenia  Past Medical History:  Past Medical History:  Diagnosis Date  . Asthma   . Depression   . Psychosis [facility])     Past Surgical History:  Procedure Laterality Date  . BACK SURGERY     Family History: History reviewed. No pertinent family history. Family Psychiatric  History: none Social History:  Social History   Substance and Sexual Activity  Alcohol Use No     Social History   Substance and Sexual Activity  Drug Use Yes  . Types: Marijuana    Social History   Socioeconomic History  . Marital status: Single    Spouse name: Not on file  . Number of children: Not on file  . Years of education: Not on file  . Highest education level: Not on file  Occupational History  . Not on file  Social Needs  . Financial resource strain: Not on file  . Food insecurity:    Worry: Not on file    Inability: Not on file  . Transportation needs:    Medical: Not on file    Non-medical: Not on file  Tobacco Use  . Smoking status: Never Smoker  . Smokeless tobacco: Never Used  Substance and Sexual Activity  .  Alcohol use: No  . Drug use: Yes    Types: Marijuana  . Sexual activity: Not Currently  Lifestyle  . Physical activity:    Days per week: Not on file    Minutes per session: Not on file  . Stress: Not on file  Relationships  . Social connections:    Talks on phone: Not on file    Gets together: Not on file    Attends religious service: Not on file    Active member of club or organization: Not on file    Attends meetings of clubs or organizations: Not on file    Relationship status: Not on file  Other Topics Concern  . Not on file  Social History Narrative  . Not on file   Additional Social History:                         Sleep: Fair  Appetite:  Fair  Current Medications: Current Facility-Administered Medications  Medication Dose Route Frequency Provider Last Rate Last Dose  . acetaminophen (TYLENOL) tablet 650 mg  650 mg Oral Q6H PRN [name], [name], MD      . alum & mag hydroxide-simeth (MAALOX/MYLANTA) 200-200-20 MG/5ML suspension 30 mL  30 mL Oral Q4H PRN [name], [name], MD      . cloZAPine (CLOZARIL) tablet 300 mg  300 mg Oral QHS [name],  [name], MD   300 mg at 03/14/18 2219  . divalproex (DEPAKOTE) DR tablet 500 mg  500 mg Oral Q12H [name], [name], MD   500 mg at 03/16/18 0915  . magnesium hydroxide (MILK OF MAGNESIA) suspension 30 mL  30 mL Oral Daily PRN [name], [name], MD      . metFORMIN (GLUCOPHAGE) tablet 500 mg  500 mg Oral Q breakfast [name], [name], MD   500 mg at 03/16/18 0915  . metoprolol tartrate (LOPRESSOR) tablet 12.5 mg  12.5 mg Oral BID [name], [name], MD   12.5 mg at 03/16/18 0920  . OLANZapine (ZYPREXA) injection 10 mg  10 mg Intramuscular BID PRN Jahden Schara B, MD      . paliperidone (INVEGA) 24 hr tablet 9 mg  9 mg Oral Daily Tifini Reeder B, MD   9 mg at 03/16/18 1002  . traZODone (DESYREL) tablet 100 mg  100 mg Oral QHS [name], [name], MD   100 mg at 03/14/18 2219    Lab Results: No results found for this or any  previous visit (from the past 48 hour(s)).  Blood Alcohol level:  Lab Results  Component Value Date   ETH <10 03/06/2018   ETH <10 12/30/2017    Metabolic Disorder Labs: Lab Results  Component Value Date   HGBA1C 4.9 03/08/2018   MPG 93.93 03/08/2018   MPG 85.32 12/30/2017   No results found for: PROLACTIN Lab Results  Component Value Date   CHOL 238 (H) 03/08/2018   TRIG 69 03/08/2018   HDL 56 03/08/2018   CHOLHDL 4.3 03/08/2018   VLDL 14 03/08/2018   LDLCALC 168 (H) 03/08/2018   LDLCALC 97 12/30/2017    Physical Findings: AIMS: Facial and Oral Movements Muscles of Facial Expression: None, normal Lips and Perioral Area: None, normal Jaw: None, normal Tongue: None, normal,Extremity Movements Upper (arms, wrists, hands, fingers): None, normal Lower (legs, knees, ankles, toes): None, normal, Trunk Movements Neck, shoulders, hips: None, normal, Overall Severity Severity of abnormal movements (highest score from questions above): None, normal Incapacitation due to abnormal movements: None, normal Patient's awareness of abnormal movements (rate only patient's report): No Awareness, Dental Status Current problems with teeth and/or dentures?: No Does patient usually wear dentures?: No  CIWA:    COWS:     Musculoskeletal: Strength & Muscle Tone: within normal limits Gait & Station: normal Patient leans: N/A  Psychiatric Specialty Exam: Physical Exam  Nursing note and vitals reviewed. Psychiatric: His affect is blunt. His speech is delayed. He is slowed, withdrawn and actively hallucinating. Thought content is paranoid and delusional. Cognition and memory are impaired. He expresses impulsivity.    Review of Systems  Neurological: Negative.   Psychiatric/Behavioral: Positive for hallucinations.  All other systems reviewed and are negative.   Blood pressure 120/82, pulse 81, temperature 98.8 F (37.1 C), temperature source Oral, resp. rate 18, height 6' 0.44" (1.84  m), weight 86.2 kg, SpO2 100 %.Body mass index is 25.46 kg/m.  General Appearance: Casual  Eye Contact:  Poor  Speech:  Blocked  Volume:  Decreased  Mood:  Dysphoric  Affect:  Flat  Thought Process:  Irrelevant and Descriptions of Associations: Tangential  Orientation:  Full (Time, Place, and Person)  Thought Content:  Delusions, Hallucinations: Auditory and Paranoid Ideation  Suicidal Thoughts:  No  Homicidal Thoughts:  No  Memory:  Immediate;   Poor Recent;   Poor Remote;   Poor  Judgement:  Poor  Insight:  Lacking  Psychomotor Activity:  Psychomotor Retardation  Concentration:  Concentration: Poor and Attention Span: Poor  Recall:  Poor  Fund of Knowledge:  Fair  Language:  Poor  Akathisia:  No  Handed:  Right  AIMS (if indicated):     Assets:  Desire for Improvement Financial Resources/Insurance Housing Physical Health Resilience Social Support  ADL's:  Intact  Cognition:  WNL  Sleep:  Number of Hours: 6.15     Treatment Plan Summary: Daily contact with patient to assess and evaluate symptoms and progress in treatment and Medication management   [name] [name] is a 22 year old male with a history of schizophrenia admitted for another psychotic break in the context of treatment noncompliance.  #Agitation -Zyprexa 10 mg IM PRN  #Psychosis -continueClozapine 300 mg nightly -Depakote 500 mg BID,VPA level 73on 8/23 -increase oral Invega to 9 mg daily -we hold Invega sustenna injections while watching ANC drop -Trazodone 150 mg nightly  #Metabolic syndrome prevention -Metformin 500 mg BID  #Tachycardia, likely from Clozapine, resolved with treatment -Metoprolol 12.5 BID  #Labs were obtainedrecently  #Social -patient would benefit from help of the guardian -patient, in my opinion, should be considered disabled  #Disposition -discharge to home with the mother -needs family meeting -needs ACT team  [name] LineaJolanta Court Gracia, MD 03/16/2018, 11:48 AM

## 2018-03-16 NOTE — Plan of Care (Signed)
Problem: Nutrition: Goal: Adequate nutrition will be maintained Outcome: Progressing   Problem: Pain Managment: Goal: General experience of comfort will improve Outcome: Progressing   Problem: Safety: Goal: Ability to remain free from injury will improve Outcome: Progressing D: Pt awake on phone in hall at this time. Presents guarded and isolative to room majority of this shift. Hypervigilant and paranoid around peers; will not eat in dayroom with others, missed breakfast because he couldn't go in when peers were eating in dayroom. Ate lunch in his room, rushed through supper before others would go in. Eye contact is avertive, as he will not raise his head to make eye contact with others, look at the floor or the other way during interactions (meds, assessment). Pt is ambulatory in milieu with a steady gait. Denies SI, HI, AVH and pain when assessed "no, not right now". Pt did not attend groups despite multiple prompts. A: Emotional support and availability offered to pt on as need basis. Pt encouraged to voice concerns, attend to his ADLS and comply with current treatment regimen including groups. Scheduled medications given as ordered. Q 15 minutes safety checks continues without self harm gestures or outburst.  R: Pt has been medication compliant. Denies adverse drug reactions. Pt has not taken shower despite agreeing to it earlier this shift "yes miss, I will". Denies concerns at this time. POC maintained for safety and mood stability.

## 2018-03-16 NOTE — Progress Notes (Signed)
Recreation Therapy Notes  Date: 03/16/2018  Time: 9:30 am   Location: Craft Room   Behavioral response: N/A   Intervention Topic: Problem Solving  Discussion/Intervention: Patient did not attend group.   Clinical Observations/Feedback:  Patient did not attend group.   Anastasiya Gowin LRT/CTRS        Labrisha Wuellner 03/16/2018 10:24 AM

## 2018-03-16 NOTE — Progress Notes (Signed)
[facility] MD Progress Note  03/17/2018 9:07 AM Roland KWAMAINE CUPPETT  MRN:  [id number]  Subjective:    [name] is still bizarre. He again walks around with his eyes covered and turns away when staff tries to interact with him. He denies any problems, especially hallucinations. He now feels ready to have visitations from his mother but not from his church friend Reuel Boom. He is secluded to his room most of the time excepts for meals and medications.  Attempted to call the mother without success.   Principal Problem: Schizoaffective disorder, bipolar type (HCC) Diagnosis:   Patient Active Problem List   Diagnosis Date Noted  . Schizoaffective disorder, bipolar type (HCC) [F25.0] 12/30/2017    Priority: High  . Diabetes (HCC) [E11.9] 03/06/2018  . Hypertension [I10] 03/06/2018  . Noncompliance [Z91.19] 12/30/2017  . Aggressive behavior of adolescent [R46.89] 10/24/2015   Total Time spent with patient: 20 minutes  Past Psychiatric History: schizophrenia  Past Medical History:  Past Medical History:  Diagnosis Date  . Asthma   . Depression   . Psychosis [facility])     Past Surgical History:  Procedure Laterality Date  . BACK SURGERY     Family History: History reviewed. No pertinent family history. Family Psychiatric  History: none Social History:  Social History   Substance and Sexual Activity  Alcohol Use No     Social History   Substance and Sexual Activity  Drug Use Yes  . Types: Marijuana    Social History   Socioeconomic History  . Marital status: Single    Spouse name: Not on file  . Number of children: Not on file  . Years of education: Not on file  . Highest education level: Not on file  Occupational History  . Not on file  Social Needs  . Financial resource strain: Not on file  . Food insecurity:    Worry: Not on file    Inability: Not on file  . Transportation needs:    Medical: Not on file    Non-medical: Not on file  Tobacco Use  . Smoking status: Never  Smoker  . Smokeless tobacco: Never Used  Substance and Sexual Activity  . Alcohol use: No  . Drug use: Yes    Types: Marijuana  . Sexual activity: Not Currently  Lifestyle  . Physical activity:    Days per week: Not on file    Minutes per session: Not on file  . Stress: Not on file  Relationships  . Social connections:    Talks on phone: Not on file    Gets together: Not on file    Attends religious service: Not on file    Active member of club or organization: Not on file    Attends meetings of clubs or organizations: Not on file    Relationship status: Not on file  Other Topics Concern  . Not on file  Social History Narrative  . Not on file   Additional Social History:                         Sleep: Fair  Appetite:  Fair  Current Medications: Current Facility-Administered Medications  Medication Dose Route Frequency Provider Last Rate Last Dose  . acetaminophen (TYLENOL) tablet 650 mg  650 mg Oral Q6H PRN [name], [name], MD      . alum & mag hydroxide-simeth (MAALOX/MYLANTA) 200-200-20 MG/5ML suspension 30 mL  30 mL Oral Q4H PRN [name], [name],  MD      . cloZAPine (CLOZARIL) tablet 300 mg  300 mg Oral QHS [name], [name], MD   300 mg at 03/16/18 2111  . divalproex (DEPAKOTE) DR tablet 500 mg  500 mg Oral Q12H [name], [name], MD   500 mg at 03/17/18 0835  . magnesium hydroxide (MILK OF MAGNESIA) suspension 30 mL  30 mL Oral Daily PRN [name], [name], MD   30 mL at 03/17/18 0530  . metFORMIN (GLUCOPHAGE) tablet 500 mg  500 mg Oral Q breakfast [name], [name], MD   500 mg at 03/17/18 [id number]  . metoprolol tartrate (LOPRESSOR) tablet 12.5 mg  12.5 mg Oral BID [name], [name], MD   12.5 mg at 03/17/18 0845  . OLANZapine (ZYPREXA) injection 10 mg  10 mg Intramuscular BID PRN Estalee Mccandlish B, MD      . paliperidone (INVEGA) 24 hr tablet 9 mg  9 mg Oral Daily Kynzlie Hilleary B, MD   9 mg at 03/17/18 0832  . traZODone (DESYREL) tablet 100 mg  100 mg Oral QHS  [name], [name], MD   100 mg at 03/16/18 2111    Lab Results: No results found for this or any previous visit (from the past 48 hour(s)).  Blood Alcohol level:  Lab Results  Component Value Date   ETH <10 03/06/2018   ETH <10 12/30/2017    Metabolic Disorder Labs: Lab Results  Component Value Date   HGBA1C 4.9 03/08/2018   MPG 93.93 03/08/2018   MPG 85.32 12/30/2017   No results found for: PROLACTIN Lab Results  Component Value Date   CHOL 238 (H) 03/08/2018   TRIG 69 03/08/2018   HDL 56 03/08/2018   CHOLHDL 4.3 03/08/2018   VLDL 14 03/08/2018   LDLCALC 168 (H) 03/08/2018   LDLCALC 97 12/30/2017    Physical Findings: AIMS: Facial and Oral Movements Muscles of Facial Expression: None, normal Lips and Perioral Area: None, normal Jaw: None, normal Tongue: None, normal,Extremity Movements Upper (arms, wrists, hands, fingers): None, normal Lower (legs, knees, ankles, toes): None, normal, Trunk Movements Neck, shoulders, hips: None, normal, Overall Severity Severity of abnormal movements (highest score from questions above): None, normal Incapacitation due to abnormal movements: None, normal Patient's awareness of abnormal movements (rate only patient's report): No Awareness, Dental Status Current problems with teeth and/or dentures?: No Does patient usually wear dentures?: No  CIWA:    COWS:     Musculoskeletal: Strength & Muscle Tone: within normal limits Gait & Station: normal Patient leans: N/A  Psychiatric Specialty Exam: Physical Exam  Nursing note and vitals reviewed. Psychiatric: His affect is blunt. His speech is delayed. He is slowed, withdrawn and actively hallucinating. Thought content is paranoid and delusional. He expresses impulsivity.    Review of Systems  Neurological: Negative.   Psychiatric/Behavioral: Positive for hallucinations.  All other systems reviewed and are negative.   Blood pressure 121/81, pulse 92, temperature 98.8 F (37.1 C),  temperature source Oral, resp. rate 18, height 6' 0.44" (1.84 m), weight 86.2 kg, SpO2 100 %.Body mass index is 25.46 kg/m.  General Appearance: Casual  Eye Contact:  Absent  Speech:  Clear and Coherent  Volume:  Normal  Mood:  Irritable  Affect:  Flat  Thought Process:  Disorganized, Irrelevant and Descriptions of Associations: Tangential  Orientation:  Full (Time, Place, and Person)  Thought Content:  Delusions, Hallucinations: Auditory and Paranoid Ideation  Suicidal Thoughts:  No  Homicidal Thoughts:  No  Memory:  Immediate;  Poor Recent;   Poor Remote;   Poor  Judgement:  Poor  Insight:  Lacking  Psychomotor Activity:  Psychomotor Retardation  Concentration:  Concentration: Fair and Attention Span: Fair  Recall:  [employer] of Knowledge:  Fair  Language:  Poor  Akathisia:  No  Handed:  Left  AIMS (if indicated):     Assets:  Communication Skills Desire for Improvement Financial Resources/Insurance Housing Physical Health Resilience Social Support  ADL's:  Intact  Cognition:  WNL  Sleep:  Number of Hours: 8     Treatment Plan Summary: Daily contact with patient to assess and evaluate symptoms and progress in treatment and Medication management   Mr. Galea is a 22 year old male with a history of schizophrenia admitted for another psychotic break in the context of treatment noncompliance. The patient is still floridly psychotic and  Bizarre. He appears to be compliant with medications but his progress has been very slow. We will check VPA and Clo level with his next blood draw on Friday. The patient has not been able to participate in discharge planning. We were unable, so far, to identify an ACT team willing to work with him or his mother.   #Agitation, resolved  #Psychosis, not improving -continueClozapine 300 mg nightly -continue Depakote 500 mg BID,VPA level 73on 8/23 -continue oral Invega 9 mg daily -we holdInvega sustenna injectionswhile watching  ANC drop -Trazodone 150 mg nightly  #Metabolic syndrome prevention -Metformin 500 mg BID  #Tachycardia, likely from Clozapine, resolved with treatment -Metoprolol 12.5 BID  #Labs were obtainedrecently  #Social -patient would benefit from help of the guardian -patient, in my opinion, should be considered disabled  #Disposition -discharge to home with the mother -needs family meeting -needs ACT team  Kristine Linea, MD 03/17/2018, 9:07 AM

## 2018-03-16 NOTE — Progress Notes (Signed)
Patient is stable and slept for 6,15 hrs without any issues and no distress, safety is maintained.

## 2018-03-17 NOTE — [facility] Group Notes (Signed)
[facility] Group Notes:  (Nursing/MHT/Case Management/Adjunct)  Date:  03/17/2018  Time:  12:31 AM  Type of Therapy:  Group Therapy  Participation Level:  Did Not Attend   Jinger NeighborsKeith D Kura Bethards 03/17/2018, 12:31 AM

## 2018-03-17 NOTE — [facility] Counselor (Signed)
CSW met with the patient to discuss discharge follow up. Patient signed for CSW to put in a referral to ACTT Team.   Since the patient has been declined by [facility] from previous admission. CSW sent paperwork to [facility] for review. CSW will follow up with them on the referral.   , MSW, LCSWA, LCASA Clinical Social Worker 03/17/2018 2:48 PM  

## 2018-03-17 NOTE — [facility] Group Notes (Signed)
[facility] Group Notes:  (Nursing/MHT/Case Management/Adjunct)  Date:  03/17/2018  Time:  9:17 PM  Type of Therapy:  Group Therapy  Participation Level:  Did Not Attend   Mayra NeerJackie L Kniyah Khun 03/17/2018, 9:17 PM

## 2018-03-17 NOTE — [facility] Group Notes (Signed)
03/17/2018 1PM  Type of Therapy/Topic:  Group Therapy:  Feelings about Diagnosis  Participation Level:  Did Not Attend   Description of Group:   This group will allow patients to explore their thoughts and feelings about diagnoses they have received. Patients will be guided to explore their level of understanding and acceptance of these diagnoses. Facilitator will encourage patients to process their thoughts and feelings about the reactions of others to their diagnosis and will guide patients in identifying ways to discuss their diagnosis with significant others in their lives. This group will be process-oriented, with patients participating in exploration of their own experiences, giving and receiving support, and processing challenge from other group members.   Therapeutic Goals: 1. Patient will demonstrate understanding of diagnosis as evidenced by identifying two or more symptoms of the disorder 2. Patient will be able to express two feelings regarding the diagnosis 3. Patient will demonstrate their ability to communicate their needs through discussion and/or role play  Summary of Patient Progress: Patient was encouraged and invited to attend group. Patient did not attend group. Social worker will continue to encourage group participation in the future.       Therapeutic Modalities:   Cognitive Behavioral Therapy Brief Therapy Feelings Identification    Faheem Ziemann, LCSW 03/17/2018 2:33 PM  

## 2018-03-17 NOTE — Progress Notes (Signed)
Recreation Therapy Notes  Date: 03/17/2018  Time: 9:30 am   Location: Craft Room   Behavioral response: N/A   Intervention Topic: Communication  Discussion/Intervention: Patient did not attend group.   Clinical Observations/Feedback:  Patient did not attend group.   Giulio Bertino LRT/CTRS        Lizeth Bencosme 03/17/2018 11:31 AM

## 2018-03-17 NOTE — Plan of Care (Signed)
Patient is safe in his room , hiding himself from people and covering his face with his palm, antisocial anxiety and paranoid when approached for therapeutic encouragements, comes out for snack and compliant with his medicines , stretches hands back wards to receive them from the nurse, doesn't interact socially , contract for safety and monitored frequently no distress , denies any SI/HI, responding to internal stimuli, facing the wall for periods and looking bizarre.   Problem: Education: Goal: Ability to state activities that reduce stress will improve Outcome: Progressing   Problem: Coping: Goal: Ability to identify and develop effective coping behavior will improve Outcome: Progressing   Problem: Self-Concept: Goal: Ability to identify factors that promote anxiety will improve Outcome: Progressing Goal: Level of anxiety will decrease Outcome: Progressing Goal: Ability to modify response to factors that promote anxiety will improve Outcome: Progressing   Problem: Education: Goal: Knowledge of General Education information will improve Description Including pain rating scale, medication(s)/side effects and non-pharmacologic comfort measures Outcome: Progressing   Problem: Health Behavior/Discharge Planning: Goal: Ability to manage health-related needs will improve Outcome: Progressing   Problem: Clinical Measurements: Goal: Ability to maintain clinical measurements within normal limits will improve Outcome: Progressing Goal: Will remain free from infection Outcome: Progressing Goal: Diagnostic test results will improve Outcome: Progressing Goal: Respiratory complications will improve Outcome: Progressing Goal: Cardiovascular complication will be avoided Outcome: Progressing   Problem: Activity: Goal: Risk for activity intolerance will decrease Outcome: Progressing   Problem: Nutrition: Goal: Adequate nutrition will be maintained Outcome: Progressing   Problem:  Coping: Goal: Level of anxiety will decrease Outcome: Progressing   Problem: Elimination: Goal: Will not experience complications related to bowel motility Outcome: Progressing Goal: Will not experience complications related to urinary retention Outcome: Progressing   Problem: Pain Managment: Goal: General experience of comfort will improve Outcome: Progressing   Problem: Safety: Goal: Ability to remain free from injury will improve Outcome: Progressing   Problem: Skin Integrity: Goal: Risk for impaired skin integrity will decrease Outcome: Progressing

## 2018-03-17 NOTE — Plan of Care (Signed)
Patient stated that he had a "better day" today "I ate and sleep good."Patient came to the day room for all meals.Patient stated that he thinks he is ready to go home.Compliant with medications.Appetite and energy level good.support and encouragement given.

## 2018-03-17 NOTE — Progress Notes (Signed)
[facility] MD Progress Note  03/18/2018 2:05 PM [name] [name]  MRN:  [id number]  Subjective:   Head covered again, voices telling him "no eye contact". Secluded to his room except for meals and medications. No program participation.  Unable to contact the mother again.   Principal Problem: Schizoaffective disorder, bipolar type (HCC) Diagnosis:   Patient Active Problem List   Diagnosis Date Noted  . Schizoaffective disorder, bipolar type (HCC) [F25.0] 12/30/2017    Priority: High  . Diabetes (HCC) [E11.9] 03/06/2018  . Hypertension [I10] 03/06/2018  . Noncompliance [Z91.19] 12/30/2017  . Aggressive behavior of adolescent [R46.89] 10/24/2015   Total Time spent with patient: 20 minutes  Past Psychiatric History: schizophrenia  Past Medical History:  Past Medical History:  Diagnosis Date  . Asthma   . Depression   . Psychosis [facility])     Past Surgical History:  Procedure Laterality Date  . BACK SURGERY     Family History: History reviewed. No pertinent family history. Family Psychiatric  History: none Social History:  Social History   Substance and Sexual Activity  Alcohol Use No     Social History   Substance and Sexual Activity  Drug Use Yes  . Types: Marijuana    Social History   Socioeconomic History  . Marital status: Single    Spouse name: Not on file  . Number of children: Not on file  . Years of education: Not on file  . Highest education level: Not on file  Occupational History  . Not on file  Social Needs  . Financial resource strain: Not on file  . Food insecurity:    Worry: Not on file    Inability: Not on file  . Transportation needs:    Medical: Not on file    Non-medical: Not on file  Tobacco Use  . Smoking status: Never Smoker  . Smokeless tobacco: Never Used  Substance and Sexual Activity  . Alcohol use: No  . Drug use: Yes    Types: Marijuana  . Sexual activity: Not Currently  Lifestyle  . Physical activity:    Days per week: Not  on file    Minutes per session: Not on file  . Stress: Not on file  Relationships  . Social connections:    Talks on phone: Not on file    Gets together: Not on file    Attends religious service: Not on file    Active member of club or organization: Not on file    Attends meetings of clubs or organizations: Not on file    Relationship status: Not on file  Other Topics Concern  . Not on file  Social History Narrative  . Not on file   Additional Social History:                         Sleep: Fair  Appetite:  Fair  Current Medications: Current Facility-Administered Medications  Medication Dose Route Frequency Provider Last Rate Last Dose  . acetaminophen (TYLENOL) tablet 650 mg  650 mg Oral Q6H PRN [name], [name], MD      . alum & mag hydroxide-simeth (MAALOX/MYLANTA) 200-200-20 MG/5ML suspension 30 mL  30 mL Oral Q4H PRN [name], [name], MD      . cloZAPine (CLOZARIL) tablet 300 mg  300 mg Oral QHS [name], [name], MD   300 mg at 03/17/18 2218  . divalproex (DEPAKOTE) DR tablet 500 mg  500 mg Oral Q12H [name],  [name], MD   500 mg at 03/18/18 [id number]  . magnesium hydroxide (MILK OF MAGNESIA) suspension 30 mL  30 mL Oral Daily PRN [name], [name], MD   30 mL at 03/17/18 0530  . metFORMIN (GLUCOPHAGE) tablet 500 mg  500 mg Oral Q breakfast [name], [name], MD   500 mg at 03/18/18 0826  . metoprolol tartrate (LOPRESSOR) tablet 12.5 mg  12.5 mg Oral BID [name], [name], MD   12.5 mg at 03/18/18 0826  . OLANZapine (ZYPREXA) injection 10 mg  10 mg Intramuscular BID PRN Joselynne Killam B, MD      . paliperidone (INVEGA) 24 hr tablet 9 mg  9 mg Oral Daily Khari Lett B, MD   9 mg at 03/18/18 0827  . traZODone (DESYREL) tablet 100 mg  100 mg Oral QHS [name], [name], MD   100 mg at 03/17/18 2218    Lab Results: No results found for this or any previous visit (from the past 48 hour(s)).  Blood Alcohol level:  Lab Results  Component Value Date   ETH <10 03/06/2018    ETH <10 12/30/2017    Metabolic Disorder Labs: Lab Results  Component Value Date   HGBA1C 4.9 03/08/2018   MPG 93.93 03/08/2018   MPG 85.32 12/30/2017   No results found for: PROLACTIN Lab Results  Component Value Date   CHOL 238 (H) 03/08/2018   TRIG 69 03/08/2018   HDL 56 03/08/2018   CHOLHDL 4.3 03/08/2018   VLDL 14 03/08/2018   LDLCALC 168 (H) 03/08/2018   LDLCALC 97 12/30/2017    Physical Findings: AIMS: Facial and Oral Movements Muscles of Facial Expression: None, normal Lips and Perioral Area: None, normal Jaw: None, normal Tongue: None, normal,Extremity Movements Upper (arms, wrists, hands, fingers): None, normal Lower (legs, knees, ankles, toes): None, normal, Trunk Movements Neck, shoulders, hips: None, normal, Overall Severity Severity of abnormal movements (highest score from questions above): None, normal Incapacitation due to abnormal movements: None, normal Patient's awareness of abnormal movements (rate only patient's report): No Awareness, Dental Status Current problems with teeth and/or dentures?: No Does patient usually wear dentures?: No  CIWA:    COWS:     Musculoskeletal: Strength & Muscle Tone: within normal limits Gait & Station: normal Patient leans: N/A  Psychiatric Specialty Exam: Physical Exam  Nursing note and vitals reviewed. Psychiatric: His affect is blunt and inappropriate. His speech is delayed. He is slowed, withdrawn and actively hallucinating. Thought content is paranoid and delusional. Cognition and memory are impaired. He expresses impulsivity.    Review of Systems  Neurological: Negative.   Psychiatric/Behavioral: Positive for hallucinations.  All other systems reviewed and are negative.   Blood pressure 128/87, pulse (!) 102, temperature 98.8 F (37.1 C), temperature source Oral, resp. rate 18, height 6' 0.44" (1.84 m), weight 86.2 kg, SpO2 100 %.Body mass index is 25.46 kg/m.  General Appearance: Casual  Eye Contact:   Absent  Speech:  Slow  Volume:  Decreased  Mood:  Euthymic  Affect:  Blunt  Thought Process:  Irrelevant and Descriptions of Associations: Tangential  Orientation:  Full (Time, Place, and Person)  Thought Content:  Delusions, Hallucinations: Auditory and Paranoid Ideation  Suicidal Thoughts:  No  Homicidal Thoughts:  No  Memory:  Immediate;   Poor Recent;   Poor Remote;   Poor  Judgement:  Poor  Insight:  Lacking  Psychomotor Activity:  Psychomotor Retardation  Concentration:  Concentration: Poor and Attention Span: Poor  Recall:  Poor  Fund of Knowledge:  Poor  Language:  Poor  Akathisia:  No  Handed:  Right  AIMS (if indicated):     Assets:  Desire for Improvement Financial Resources/Insurance Housing Physical Health Resilience Social Support  ADL's:  Intact  Cognition:  WNL  Sleep:  Number of Hours: 5.75     Treatment Plan Summary: Daily contact with patient to assess and evaluate symptoms and progress in treatment and Medication management   Mr. Caterino is a 22 year old male with a history of schizophrenia admitted for another psychotic break in the context of treatment noncompliance. The patient is still floridly psychotic and  Bizarre. He appears to be compliant with medications but his progress has been very slow. We will check VPA and Clo level with his next blood draw on Friday. The patient has not been able to participate in discharge planning. We were unable, so far, to identify an ACT team willing to work with him or his mother.   #Agitation, resolved  #Psychosis, not improving -continueClozapine 300 mg nightly -continue Depakote 500 mg BID,VPA level 73on 8/23 -continue oral Invega 9 mg daily -we holdInvega sustenna injectionswhile watching ANC drop -Trazodone 150 mg nightly  #Metabolic syndrome prevention -Metformin 500 mg BID  #Tachycardia, likely from Clozapine, resolved with treatment -Metoprolol 12.5 BID  #Labs were  obtainedrecently  #Social -patient would benefit from help of the guardian -patient, in my opinion, should be considered disabled  #Disposition -discharge to home with the mother -needs family meeting -needs ACT team  Kristine Linea, MD 03/18/2018, 2:05 PM

## 2018-03-18 NOTE — Progress Notes (Signed)
Recreation Therapy Notes  Date: 03/19/2018  Time: 9:30 am   Location: Craft Room   Behavioral response: N/A   Intervention Topic: Emotions  Discussion/Intervention: Patient did not attend group.   Clinical Observations/Feedback:  Patient did not attend group.   Lawrie Tunks LRT/CTRS        [name] [name] 03/18/2018 10:20 AM 

## 2018-03-18 NOTE — BHH Counselor (Signed)
CSW spoke with Liborio NixonJanice at [facility] to follow up on the patients ACTT referral. She reports that she will come and visit with the patient on Tuesday 9.3.2019 at 11am to compete an assessment.  Johny Shearsassandra Abby Tucholski, MSW, Theresia MajorsLCSWA, Bridget HartshornLCASA Clinical Social Worker 03/18/2018 3:12 PM

## 2018-03-18 NOTE — Progress Notes (Addendum)
[facility] MD Progress Note  03/19/2018 10:00 AM [name] [name]  MRN:  [id number]  Subjective:   [name] [name] is improving very slowly. He no longer walks around covering his eyes with his hands but is still obeying the voices commanding him "no eye contact". He is turning his head away while walking and covers it with a blanket in his room. He denies any psychotic symptoms but it is clear that he attends to internal stimuli and is paranoid. Otherwise, he has been placid and polite. His thinking is still disorganized and there is remarkable poverty of speech.   Attempted to call his mother again without success. The patient is now ready to see her during visitation hours.  Principal Problem: Schizoaffective disorder, bipolar type (HCC) Diagnosis:   Patient Active Problem List   Diagnosis Date Noted  . Schizoaffective disorder, bipolar type (HCC) [F25.0] 12/30/2017    Priority: High  . Diabetes (HCC) [E11.9] 03/06/2018  . Hypertension [I10] 03/06/2018  . Noncompliance [Z91.19] 12/30/2017  . Aggressive behavior of adolescent [R46.89] 10/24/2015   Total Time spent with patient: 20 minutes  Past Psychiatric History: schizophrenia  Past Medical History:  Past Medical History:  Diagnosis Date  . Asthma   . Depression   . Psychosis [facility])     Past Surgical History:  Procedure Laterality Date  . BACK SURGERY     Family History: History reviewed. No pertinent family history. Family Psychiatric  History: none Social History:  Social History   Substance and Sexual Activity  Alcohol Use No     Social History   Substance and Sexual Activity  Drug Use Yes  . Types: Marijuana    Social History   Socioeconomic History  . Marital status: Single    Spouse name: Not on file  . Number of children: Not on file  . Years of education: Not on file  . Highest education level: Not on file  Occupational History  . Not on file  Social Needs  . Financial resource strain: Not on file  . Food  insecurity:    Worry: Not on file    Inability: Not on file  . Transportation needs:    Medical: Not on file    Non-medical: Not on file  Tobacco Use  . Smoking status: Never Smoker  . Smokeless tobacco: Never Used  Substance and Sexual Activity  . Alcohol use: No  . Drug use: Yes    Types: Marijuana  . Sexual activity: Not Currently  Lifestyle  . Physical activity:    Days per week: Not on file    Minutes per session: Not on file  . Stress: Not on file  Relationships  . Social connections:    Talks on phone: Not on file    Gets together: Not on file    Attends religious service: Not on file    Active member of club or organization: Not on file    Attends meetings of clubs or organizations: Not on file    Relationship status: Not on file  Other Topics Concern  . Not on file  Social History Narrative  . Not on file   Additional Social History:                         Sleep: Fair  Appetite:  Fair  Current Medications: Current Facility-Administered Medications  Medication Dose Route Frequency Provider Last Rate Last Dose  . acetaminophen (TYLENOL) tablet 650 mg  650 mg  Oral Q6H PRN [name], [name], MD      . alum & mag hydroxide-simeth (MAALOX/MYLANTA) 200-200-20 MG/5ML suspension 30 mL  30 mL Oral Q4H PRN [name], [name], MD      . cloZAPine (CLOZARIL) tablet 300 mg  300 mg Oral QHS [name], [name], MD   300 mg at 03/18/18 2044  . divalproex (DEPAKOTE) DR tablet 500 mg  500 mg Oral Q12H [name], [name], MD   500 mg at 03/19/18 0810  . magnesium hydroxide (MILK OF MAGNESIA) suspension 30 mL  30 mL Oral Daily PRN [name], [name], MD   30 mL at 03/17/18 0530  . metFORMIN (GLUCOPHAGE) tablet 500 mg  500 mg Oral Q breakfast [name], [name], MD   500 mg at 03/19/18 0809  . metoprolol tartrate (LOPRESSOR) tablet 12.5 mg  12.5 mg Oral BID [name], [name], MD   12.5 mg at 03/19/18 0809  . OLANZapine (ZYPREXA) injection 10 mg  10 mg Intramuscular BID PRN [name],  [name], MD      . paliperidone (INVEGA) 24 hr tablet 9 mg  9 mg Oral Daily [name], [name], MD   9 mg at 03/19/18 0809  . traZODone (DESYREL) tablet 100 mg  100 mg Oral QHS [name], [name], MD   100 mg at 03/18/18 2045    Lab Results: No results found for this or any previous visit (from the past 48 hour(s)).  Blood Alcohol level:  Lab Results  Component Value Date   ETH <10 03/06/2018   ETH <10 12/30/2017    Metabolic Disorder Labs: Lab Results  Component Value Date   HGBA1C 4.9 03/08/2018   MPG 93.93 03/08/2018   MPG 85.32 12/30/2017   No results found for: PROLACTIN Lab Results  Component Value Date   CHOL 238 (H) 03/08/2018   TRIG 69 03/08/2018   HDL 56 03/08/2018   CHOLHDL 4.3 03/08/2018   VLDL 14 03/08/2018   LDLCALC 168 (H) 03/08/2018   LDLCALC 97 12/30/2017    Physical Findings: AIMS: Facial and Oral Movements Muscles of Facial Expression: None, normal Lips and Perioral Area: None, normal Jaw: None, normal Tongue: None, normal,Extremity Movements Upper (arms, wrists, hands, fingers): None, normal Lower (legs, knees, ankles, toes): None, normal, Trunk Movements Neck, shoulders, hips: None, normal, Overall Severity Severity of abnormal movements (highest score from questions above): None, normal Incapacitation due to abnormal movements: None, normal Patient's awareness of abnormal movements (rate only patient's report): No Awareness, Dental Status Current problems with teeth and/or dentures?: No Does patient usually wear dentures?: No  CIWA:    COWS:     Musculoskeletal: Strength & Muscle Tone: within normal limits Gait & Station: normal Patient leans: N/A  Psychiatric Specialty Exam: Physical Exam  Nursing note and vitals reviewed. Psychiatric: His affect is blunt. His speech is delayed. He is slowed, withdrawn and actively hallucinating. Thought content is paranoid and delusional. Cognition and memory are impaired. He expresses impulsivity.     Review of Systems  Neurological: Negative.   Psychiatric/Behavioral: Positive for hallucinations.  All other systems reviewed and are negative.   Blood pressure (!) 133/96, pulse 80, temperature 97.9 F (36.6 C), temperature source Oral, resp. rate 16, height 6' 0.44" (1.84 m), weight 86.2 kg, SpO2 100 %.Body mass index is 25.46 kg/m.  General Appearance: Casual  Eye Contact:  Absent  Speech:  Slow  Volume:  Decreased  Mood:  Euthymic  Affect:  Blunt  Thought Process:  Goal Directed and  Descriptions of Associations: Intact  Orientation:  Full (Time, Place, and Person)  Thought Content:  Illogical, Delusions, Hallucinations: Auditory Command:  no eye contact, Obsessions, Paranoid Ideation and Rumination  Suicidal Thoughts:  No  Homicidal Thoughts:  No  Memory:  Immediate;   Poor Recent;   Poor Remote;   Poor  Judgement:  Poor  Insight:  Lacking  Psychomotor Activity:  Psychomotor Retardation  Concentration:  Concentration: Poor and Attention Span: Poor  Recall:  Poor  Fund of Knowledge:  Fair  Language:  Poor  Akathisia:  No  Handed:  Right  AIMS (if indicated):     Assets:  Desire for Improvement Financial Resources/Insurance Housing Physical Health Resilience Social Support  ADL's:  Intact  Cognition:  Impaired,  Mild  Sleep:  Number of Hours: 7.3     Treatment Plan Summary: Daily contact with patient to assess and evaluate symptoms and progress in treatment and Medication management   Mr. Bebo is a 22 year old male with a history of schizophrenia admitted for another psychotic break in the context of treatment noncompliance.The patient is still floridly psychotic and bizarre. He appears to be compliant with medications but his progress has been very slow. We will check VPA and Clo level with his next blood draw on Friday. The patient has not been able to participate in discharge planning or programming. He was referred to [facility] ACT team.    #Psychosis, not improving -continueClozapine 300 mg nightly -continueDepakote 500 mg BID,VPA level 73on 8/23 -continueoral Invega 9 mg daily -we holdInvega sustenna injectionswhile watching ANC drop -Trazodone 150 mg nightly  #Metabolic syndrome prevention -Metformin 500 mg BID  #Tachycardia, likely from Clozapine, resolved with treatment -Metoprolol 12.5 BID  #Labs were obtainedrecently  #Social -patient would benefit from help of the guardian -patient, in my opinion, should be considered disabled  #Disposition -discharge to home with the mother -needs family meeting -[name] ACT team to assess the patient next week  Kristine Linea, MD 03/19/2018, 10:00 AM

## 2018-03-18 NOTE — Progress Notes (Signed)
Patient is alert and oriented x 4, he is interacting appropriately with pears and staff. He denies SI/HI/AVH, was noted during interaction patient was able to make eye contact. Patient seen in the milieu, he came for snacks and also was in the medication room for medication. No distress noted, thoughts are organized and coherent , 15 minutes safety checks maintained, will continue to monitor.

## 2018-03-18 NOTE — Tx Team (Signed)
Interdisciplinary Treatment and Diagnostic Plan Update  03/18/2018 Time of Session: 10:30am [name] [name] MRN: [id number]  Principal Diagnosis: Schizoaffective disorder, bipolar type (HCC)  Secondary Diagnoses: Principal Problem:   Schizoaffective disorder, bipolar type (HCC) Active Problems:   Noncompliance   Diabetes (HCC)   Hypertension   Current Medications:  Current Facility-Administered Medications  Medication Dose Route Frequency Provider Last Rate Last Dose  . acetaminophen (TYLENOL) tablet 650 mg  650 mg Oral Q6H PRN [name], [name], MD      . alum & mag hydroxide-simeth (MAALOX/MYLANTA) 200-200-20 MG/5ML suspension 30 mL  30 mL Oral Q4H PRN [name], [name], MD      . cloZAPine (CLOZARIL) tablet 300 mg  300 mg Oral QHS [name], [name], MD   300 mg at 03/17/18 2218  . divalproex (DEPAKOTE) DR tablet 500 mg  500 mg Oral Q12H [name], [name], MD   500 mg at 03/18/18 0826  . magnesium hydroxide (MILK OF MAGNESIA) suspension 30 mL  30 mL Oral Daily PRN [name], [name], MD   30 mL at 03/17/18 0530  . metFORMIN (GLUCOPHAGE) tablet 500 mg  500 mg Oral Q breakfast [name], [name], MD   500 mg at 03/18/18 0826  . metoprolol tartrate (LOPRESSOR) tablet 12.5 mg  12.5 mg Oral BID [name], [name], MD   12.5 mg at 03/18/18 0826  . OLANZapine (ZYPREXA) injection 10 mg  10 mg Intramuscular BID PRN [name], [name], MD      . paliperidone (INVEGA) 24 hr tablet 9 mg  9 mg Oral Daily [name], [name], MD   9 mg at 03/18/18 0827  . traZODone (DESYREL) tablet 100 mg  100 mg Oral QHS [name], [name], MD   100 mg at 03/17/18 2218   PTA Medications: Medications Prior to Admission  Medication Sig Dispense Refill Last Dose  . cloZAPine (CLOZARIL) 100 MG tablet Take 3 tablets (300 mg total) by mouth at bedtime. 90 tablet 1` Unknown at Unknown  . divalproex (DEPAKOTE) 500 MG DR tablet Take 1 tablet (500 mg total) by mouth every 12 (twelve) hours. 60 tablet 1 Unknown at Unknown  .  hydrOXYzine (ATARAX/VISTARIL) 50 MG tablet Take 1 tablet (50 mg total) by mouth 3 (three) times daily as needed for anxiety. 90 tablet 1 Unknown at Unknown  . metFORMIN (GLUCOPHAGE) 500 MG tablet Take 1 tablet (500 mg total) by mouth daily with breakfast. 30 tablet 1 Unknown at Unknown  . metoprolol tartrate (LOPRESSOR) 25 MG tablet Take 0.5 tablets (12.5 mg total) by mouth 2 (two) times daily. 60 tablet 1 Unknown at Unknown  . traZODone (DESYREL) 100 MG tablet Take 1 tablet (100 mg total) by mouth at bedtime as needed for sleep. 30 tablet 1 Unknown at Unknown    Patient Stressors: Financial difficulties Health problems  Patient Strengths: Average or above average intelligence Supportive family/friends  Treatment Modalities: Medication Management, Group therapy, Case management,  1 to 1 session with clinician, Psychoeducation, Recreational therapy.   Physician Treatment Plan for Primary Diagnosis: Schizoaffective disorder, bipolar type (HCC) Long Term Goal(s): Improvement in symptoms so as ready for discharge Improvement in symptoms so as ready for discharge   Short Term Goals: Ability to identify changes in lifestyle to reduce recurrence of condition will improve Ability to verbalize feelings will improve Ability to disclose and discuss suicidal ideas Ability to demonstrate self-control will improve Ability to identify and develop effective coping behaviors will improve Ability to maintain clinical measurements within normal  limits will improve Compliance with prescribed medications will improve Ability to identify triggers associated with substance abuse/mental health issues will improve Ability to identify changes in lifestyle to reduce recurrence of condition will improve Ability to verbalize feelings will improve Ability to disclose and discuss suicidal ideas Ability to demonstrate self-control will improve Ability to identify and develop effective coping behaviors will  improve Ability to maintain clinical measurements within normal limits will improve Compliance with prescribed medications will improve Ability to identify triggers associated with substance abuse/mental health issues will improve  Medication Management: Evaluate patient's response, side effects, and tolerance of medication regimen.  Therapeutic Interventions: 1 to 1 sessions, Unit Group sessions and Medication administration.  Evaluation of Outcomes: Not Progressing  Physician Treatment Plan for Secondary Diagnosis: Principal Problem:   Schizoaffective disorder, bipolar type (HCC) Active Problems:   Noncompliance   Diabetes (HCC)   Hypertension  Long Term Goal(s): Improvement in symptoms so as ready for discharge Improvement in symptoms so as ready for discharge   Short Term Goals: Ability to identify changes in lifestyle to reduce recurrence of condition will improve Ability to verbalize feelings will improve Ability to disclose and discuss suicidal ideas Ability to demonstrate self-control will improve Ability to identify and develop effective coping behaviors will improve Ability to maintain clinical measurements within normal limits will improve Compliance with prescribed medications will improve Ability to identify triggers associated with substance abuse/mental health issues will improve Ability to identify changes in lifestyle to reduce recurrence of condition will improve Ability to verbalize feelings will improve Ability to disclose and discuss suicidal ideas Ability to demonstrate self-control will improve Ability to identify and develop effective coping behaviors will improve Ability to maintain clinical measurements within normal limits will improve Compliance with prescribed medications will improve Ability to identify triggers associated with substance abuse/mental health issues will improve     Medication Management: Evaluate patient's response, side effects, and  tolerance of medication regimen.  Therapeutic Interventions: 1 to 1 sessions, Unit Group sessions and Medication administration.  Evaluation of Outcomes: Not Progressing   RN Treatment Plan for Primary Diagnosis: Schizoaffective disorder, bipolar type (HCC) Long Term Goal(s): Knowledge of disease and therapeutic regimen to maintain health will improve  Short Term Goals: Ability to remain free from injury will improve, Ability to demonstrate self-control, Ability to identify and develop effective coping behaviors will improve and Compliance with prescribed medications will improve  Medication Management: RN will administer medications as ordered by provider, will assess and evaluate patient's response and provide education to patient for prescribed medication. RN will report any adverse and/or side effects to prescribing provider.  Therapeutic Interventions: 1 on 1 counseling sessions, Psychoeducation, Medication administration, Evaluate responses to treatment, Monitor vital signs and CBGs as ordered, Perform/monitor CIWA, COWS, AIMS and Fall Risk screenings as ordered, Perform wound care treatments as ordered.  Evaluation of Outcomes: Not Progressing   LCSW Treatment Plan for Primary Diagnosis: Schizoaffective disorder, bipolar type (HCC) Long Term Goal(s): Safe transition to appropriate next level of care at discharge, Engage patient in therapeutic group addressing interpersonal concerns.  Short Term Goals: Engage patient in aftercare planning with referrals and resources, Increase social support, Identify triggers associated with mental health/substance abuse issues and Increase skills for wellness and recovery  Therapeutic Interventions: Assess for all discharge needs, 1 to 1 time with Social worker, Explore available resources and support systems, Assess for adequacy in community support network, Educate family and significant other(s) on suicide prevention, Complete Psychosocial  Assessment, Interpersonal group  therapy.  Evaluation of Outcomes: Not Progressing   Progress in Treatment: Attending groups: No. Participating in groups: No. Taking medication as prescribed: Yes. Toleration medication: Yes. Family/Significant other contact made: No, will contact:  Patient refused Patient understands diagnosis: Yes. Discussing patient identified problems/goals with staff: Yes. Medical problems stabilized or resolved: Yes. Denies suicidal/homicidal ideation: Yes. Issues/concerns per patient self-inventory: No. Other:   New problem(s) identified: No, Describe:  None  New Short Term/Long Term Goal(s): "To get back on medications and get better."  Patient Goals:  "To get back on medications and get better."  Discharge Plan or Barriers: To return back to his mothers house and follow up with outpatient treatment.  Reason for Continuation of Hospitalization: Medication stabilization  Estimated Length of Stay: 7 days    Attendees: Patient:  03/18/2018 11:30 AM  Physician: Dr. Cecil Cranker, MD 03/18/2018 11:30 AM  Nursing: Hulan Amato, RN 03/18/2018 11:30 AM  RN Care Manager: 03/18/2018 11:30 AM  Social Worker: [name], LCSWA 03/18/2018 11:30 AM  Recreational Therapist: Danella Deis. Outlaw CTRS, LRT 03/18/2018 11:30 AM  Other: Jake Shark, LCSW 03/18/2018 11:30 AM  Other:  03/18/2018 11:30 AM  Other: 03/18/2018 11:30 AM    Scribe for Treatment Team: [name], LCSW 03/18/2018 11:30 AM

## 2018-03-18 NOTE — Plan of Care (Signed)
Patient is alert and oriented to self, place and time.He denies SI/HI/AVH, was noted during interaction patient that he was still not able to make eye contact with this writer, patient cracked his eyes open to a peep. Patient seen in the milieu periodically, he comes out for meals and medications. No distress noted, thoughts are organized and coherent , 15 minutes safety checks maintained, will continue to monitor.

## 2018-03-19 NOTE — [facility] Group Notes (Signed)
03/19/2018 1PM  Type of Therapy/Topic:  Group Therapy:  Balance in Life  Participation Level:  Did Not Attend  Description of Group:   This group will address the concept of balance and how it feels and looks when one is unbalanced. Patients will be encouraged to process areas in their lives that are out of balance and identify reasons for remaining unbalanced. Facilitators will guide patients in utilizing problem-solving interventions to address and correct the stressor making their life unbalanced. Understanding and applying boundaries will be explored and addressed for obtaining and maintaining a balanced life. Patients will be encouraged to explore ways to assertively make their unbalanced needs known to significant others in their lives, using other group members and facilitator for support and feedback.  Therapeutic Goals: 1. Patient will identify two or more emotions or situations they have that consume much of in their lives. 2. Patient will identify signs/triggers that life has become out of balance:  3. Patient will identify two ways to set boundaries in order to achieve balance in their lives:  4. Patient will demonstrate ability to communicate their needs through discussion and/or role plays  Summary of Patient Progress: Patient was encouraged and invited to attend group. Patient did not attend group. Social worker will continue to encourage group participation in the future.        Therapeutic Modalities:   Cognitive Behavioral Therapy Solution-Focused Therapy Assertiveness Training  Raylin Winer, LCSW  

## 2018-03-19 NOTE — Progress Notes (Signed)
Recreation Therapy Notes  Date: 03/19/2018  Time: 9:30 am   Location: Craft Room   Behavioral response: N/A   Intervention Topic: Goals  Discussion/Intervention: Patient did not attend group.   Clinical Observations/Feedback:  Patient did not attend group.   [name] [name] LRT/CTRS        [name] 03/19/2018 11:08 AM

## 2018-03-19 NOTE — Plan of Care (Signed)
Remains isolative in room with bizarre behavior

## 2018-03-19 NOTE — Progress Notes (Addendum)
Patient continues to display bizarre behavior and disorganized thinking. Remains isolative in room, avoiding to talk to staff and peers.  Accepted his Depakote but refused to go to the dayroom for group and snack. Reported that voices are telling him not to go. Patient asked to be left alone in room. Currently standing in the room, facing the wall. Safety precautions reinforced.  0700: [name] [name] slept all night and had no sign of distress. Currently remains in bed. Refused to go to the dayroom for vital signs. Has no concern this morning. Safety maintained on the unit.

## 2018-03-19 NOTE — [facility] Group Notes (Signed)
[facility] Group Notes:  (Nursing/MHT/Case Management/Adjunct)  Date:  03/19/2018  Time:  1:07 AM  Type of Therapy:  Group Therapy  Participation Level:  Did Not Attend   [name] NeighborsKeith D Nichelle [name] 03/19/2018, 1:07 AM

## 2018-03-19 NOTE — Plan of Care (Signed)
Paranoid, delusional, poor eye contact, avoidant but not covering his eyes with hands; pleasant but conversation is limited to Yes/No, he quickly will ask back the same question to diminish time of conversation; dismissive and evasive. Improving, asked for IM in lieu of PO medications.  Patient slept for Estimated Hours of 7.30; Precautionary checks every 15 minutes for safety maintained, room free of safety hazards, patient sustains no injury or falls during this shift.   Problem: Self-Concept: Goal: Level of anxiety will decrease Outcome: Progressing   Problem: Clinical Measurements: Goal: Will remain free from infection Outcome: Progressing   Problem: Nutrition: Goal: Adequate nutrition will be maintained Outcome: Progressing   Problem: Coping: Goal: Level of anxiety will decrease Outcome: Progressing   Problem: Safety: Goal: Ability to remain free from injury will improve Outcome: Progressing   Problem: Education: Goal: Ability to state activities that reduce stress will improve Outcome: Not Progressing   Problem: Coping: Goal: Ability to identify and develop effective coping behavior will improve Outcome: Not Progressing   Problem: Self-Concept: Goal: Ability to identify factors that promote anxiety will improve Outcome: Not Progressing   Problem: Health Behavior/Discharge Planning: Goal: Ability to manage health-related needs will improve Outcome: Not Progressing

## 2018-03-19 NOTE — Plan of Care (Signed)
Patient is alert and oriented to self, place and time.He denies having thoughts of wanting to hurt himself or anyone else. Patient still verbalizes still hearing voices "sometimes".  Patient is improving very slowly. He no longer walks around covering his eyes with his hands or looks away but is still obeying the voices commanding him no eye contact.  Patient seen in the milieu periodically, he comes out for meals and medications. No distress noted, thoughts are organized and coherent , 15 minutes safety checks maintained, will continue to monitor.

## 2018-03-19 NOTE — Progress Notes (Addendum)
[facility] MD Progress Note  03/20/2018 11:13 AM Ulyses MELROY BOUGHER  MRN:  [id number]  Subjective:   Mr. Wickham is a 22 year old male with a history of treatment resistant schizophrenia, multiple hospitalizations and treatment noncompliance. Both, the patient and his mother have no insight into mental illness. He responds well to a combination of Clozapine and Depakote. He decompensates quickly but his recover is usually protracted with catatonia and extended hospitalization. He obeys voices telling him "no eye contact" and "stay in your room".  Mr. Brisby continues to be floridly psychotic. He is in bed with his head covered. Answers yes or no questions. Does no engage in conversation. Denies any symptoms whatsoever and is asking for discharge. Complainant with treatment, reports no somatic complaints but making NO progress.   Principal Problem: Schizoaffective disorder, bipolar type (HCC) Diagnosis:   Patient Active Problem List   Diagnosis Date Noted  . Schizoaffective disorder, bipolar type (HCC) [F25.0] 12/30/2017    Priority: High  . Hypertension [I10] 03/06/2018  . Noncompliance [Z91.19] 12/30/2017  . Aggressive behavior of adolescent [R46.89] 10/24/2015   Total Time spent with patient: 15 minutes  Past Psychiatric History: schizophrenia  Past Medical History:  Past Medical History:  Diagnosis Date  . Asthma   . Depression   . Psychosis [facility])     Past Surgical History:  Procedure Laterality Date  . BACK SURGERY     Family History: History reviewed. No pertinent family history. Family Psychiatric  History: none Social History:  Social History   Substance and Sexual Activity  Alcohol Use No     Social History   Substance and Sexual Activity  Drug Use Yes  . Types: Marijuana    Social History   Socioeconomic History  . Marital status: Single    Spouse name: Not on file  . Number of children: Not on file  . Years of education: Not on file  . Highest education  level: Not on file  Occupational History  . Not on file  Social Needs  . Financial resource strain: Not on file  . Food insecurity:    Worry: Not on file    Inability: Not on file  . Transportation needs:    Medical: Not on file    Non-medical: Not on file  Tobacco Use  . Smoking status: Never Smoker  . Smokeless tobacco: Never Used  Substance and Sexual Activity  . Alcohol use: No  . Drug use: Yes    Types: Marijuana  . Sexual activity: Not Currently  Lifestyle  . Physical activity:    Days per week: Not on file    Minutes per session: Not on file  . Stress: Not on file  Relationships  . Social connections:    Talks on phone: Not on file    Gets together: Not on file    Attends religious service: Not on file    Active member of club or organization: Not on file    Attends meetings of clubs or organizations: Not on file    Relationship status: Not on file  Other Topics Concern  . Not on file  Social History Narrative  . Not on file   Additional Social History:                         Sleep: Fair  Appetite:  Fair  Current Medications: Current Facility-Administered Medications  Medication Dose Route Frequency Provider Last Rate Last Dose  . acetaminophen (TYLENOL) tablet  650 mg  650 mg Oral Q6H PRN [name], [name], MD      . alum & mag hydroxide-simeth (MAALOX/MYLANTA) 200-200-20 MG/5ML suspension 30 mL  30 mL Oral Q4H PRN [name], [name], MD      . cloZAPine (CLOZARIL) tablet 300 mg  300 mg Oral QHS [name], [name], MD   300 mg at 03/19/18 2135  . divalproex (DEPAKOTE) DR tablet 500 mg  500 mg Oral Q12H [name], [name], MD   500 mg at 03/20/18 0805  . magnesium hydroxide (MILK OF MAGNESIA) suspension 30 mL  30 mL Oral Daily PRN [name], [name], MD   30 mL at 03/17/18 0530  . metFORMIN (GLUCOPHAGE) tablet 500 mg  500 mg Oral Q breakfast [name], [name], MD   500 mg at 03/20/18 0805  . metoprolol tartrate (LOPRESSOR) tablet 12.5 mg  12.5 mg Oral BID  [name], [name], MD   12.5 mg at 03/20/18 0804  . OLANZapine (ZYPREXA) injection 10 mg  10 mg Intramuscular BID PRN Moriah Shawley B, MD      . traZODone (DESYREL) tablet 100 mg  100 mg Oral QHS [name], [name], MD   100 mg at 03/19/18 2135    Lab Results:  Results for orders placed or performed during the hospital encounter of 03/07/18 (from the past 48 hour(s))  CBC with Differential/Platelet     Status: None   Collection Time: 03/20/18  7:10 AM  Result Value Ref Range   WBC 4.6 3.8 - 10.6 K/uL   RBC 5.56 4.40 - 5.90 MIL/uL   Hemoglobin 15.7 13.0 - 18.0 g/dL   HCT 84.645.4 96.240.0 - 95.252.0 %   MCV 81.7 80.0 - 100.0 fL   MCH 28.1 26.0 - 34.0 pg   MCHC 34.5 32.0 - 36.0 g/dL   RDW 84.112.9 32.411.5 - 40.114.5 %   Platelets 194 150 - 440 K/uL   Neutrophils Relative % 41 %   Neutro Abs 1.9 1.4 - 6.5 K/uL   Lymphocytes Relative 46 %   Lymphs Abs 2.1 1.0 - 3.6 K/uL   Monocytes Relative 13 %   Monocytes Absolute 0.6 0.2 - 1.0 K/uL   Eosinophils Relative 0 %   Eosinophils Absolute 0.0 0 - 0.7 K/uL   Basophils Relative 0 %   Basophils Absolute 0.0 0 - 0.1 K/uL    Comment: Performed at [facility], [address] [place], [place] [id number]    Blood Alcohol level:  Lab Results  Component Value Date   [facility]   ETH <10 12/30/2017    Metabolic Disorder Labs: Lab Results  Component Value Date   HGBA1C 4.9 03/08/2018   MPG 93.93 03/08/2018   MPG 85.32 12/30/2017   No results found for: PROLACTIN Lab Results  Component Value Date   CHOL 238 (H) 03/08/2018   TRIG 69 03/08/2018   HDL 56 03/08/2018   CHOLHDL 4.3 03/08/2018   VLDL 14 03/08/2018   LDLCALC 168 (H) 03/08/2018   LDLCALC 97 12/30/2017    Physical Findings: AIMS: Facial and Oral Movements Muscles of Facial Expression: None, normal Lips and Perioral Area: None, normal Jaw: None, normal Tongue: None, normal,Extremity Movements Upper (arms, wrists, hands, fingers): None, normal Lower (legs, knees, ankles,  toes): None, normal, Trunk Movements Neck, shoulders, hips: None, normal, Overall Severity Severity of abnormal movements (highest score from questions above): None, normal Incapacitation due to abnormal movements: None, normal Patient's awareness of abnormal movements (rate only patient's report): No  Awareness, Dental Status Current problems with teeth and/or dentures?: No Does patient usually wear dentures?: No  CIWA:    COWS:     Musculoskeletal: Strength & Muscle Tone: within normal limits Gait & Station: normal Patient leans: N/A  Psychiatric Specialty Exam: Physical Exam  Nursing note and vitals reviewed. Psychiatric: His affect is blunt. His speech is delayed. He is slowed, withdrawn and actively hallucinating. Thought content is delusional. Cognition and memory are impaired. He expresses impulsivity.    Review of Systems  Neurological: Negative.   Psychiatric/Behavioral: Positive for hallucinations.  All other systems reviewed and are negative.   Blood pressure 108/64, pulse 74, temperature 97.6 F (36.4 C), temperature source Oral, resp. rate 18, height 6' 0.44" (1.84 m), weight 86.2 kg, SpO2 100 %.Body mass index is 25.46 kg/m.  General Appearance: Disheveled  Eye Contact:  Absent  Speech:  Slow and poverty of speech  Volume:  Decreased  Mood:  Euthymic  Affect:  Blunt  Thought Process:  Disorganized, Irrelevant and Descriptions of Associations: Tangential  Orientation:  Full (Time, Place, and Person)  Thought Content:  Delusions, Hallucinations: Auditory and Paranoid Ideation  Suicidal Thoughts:  No  Homicidal Thoughts:  No  Memory:  Immediate;   Poor Recent;   Poor Remote;   Poor  Judgement:  Poor  Insight:  Lacking  Psychomotor Activity:  Psychomotor Retardation  Concentration:  Concentration: Poor and Attention Span: Poor  Recall:  Fair  Fund of Knowledge:  Poor  Language:  Poor  Akathisia:  No  Handed:  Right  AIMS (if indicated):     Assets:  Desire  for Improvement Financial Resources/Insurance Housing Physical Health Resilience Social Support  ADL's:  Intact  Cognition:  WNL  Sleep:  Number of Hours: 6.5     Treatment Plan Summary: Daily contact with patient to assess and evaluate symptoms and progress in treatment and Medication management   Mr. Nordahl is a 22 year old male with a history of schizophrenia admitted for another psychotic break in the context of treatment noncompliance.The patient is still floridly psychotic and bizarre. He appears to be compliant with medications but his progress has been very slow. The patient has not been able to participate in discharge planning or programming. He was referred to [facility] ACT team.   #Psychosis, not improving -continueClozapine 300 mg nightly, Clo level pending -continueDepakote 500 mg BID,VPA level 73on 8/23 -discontinueoral Invega 9 mg daily, it is ineffective and possibly lowers ANC -Trazodone 150 mg nightly  #Metabolic syndrome prevention -Metformin 500 mg BID  #Tachycardia, likely from Clozapine, resolved with treatment -Metoprolol 12.5 BID  #Labs were obtainedrecently  #Social -patient would benefit from help of the guardian -patient, in my opinion, should be considered disabled  #Disposition -discharge to home with the mother -needs family meeting -[name] ACT team to assess the patient next week  Kristine Linea, MD 03/20/2018, 11:13 AM

## 2018-03-20 LAB — CBC WITH DIFFERENTIAL/PLATELET
BASOS ABS: 0 10*3/uL (ref 0–0.1)
BASOS PCT: 0 %
EOS PCT: 0 %
Eosinophils Absolute: 0 10*3/uL (ref 0–0.7)
HCT: 45.4 % (ref 40.0–52.0)
Hemoglobin: 15.7 g/dL (ref 13.0–18.0)
Lymphocytes Relative: 46 %
Lymphs Abs: 2.1 10*3/uL (ref 1.0–3.6)
MCH: 28.1 pg (ref 26.0–34.0)
MCHC: 34.5 g/dL (ref 32.0–36.0)
MCV: 81.7 fL (ref 80.0–100.0)
MONO ABS: 0.6 10*3/uL (ref 0.2–1.0)
Monocytes Relative: 13 %
NEUTROS ABS: 1.9 10*3/uL (ref 1.4–6.5)
Neutrophils Relative %: 41 %
PLATELETS: 194 10*3/uL (ref 150–440)
RBC: 5.56 MIL/uL (ref 4.40–5.90)
RDW: 12.9 % (ref 11.5–14.5)
WBC: 4.6 10*3/uL (ref 3.8–10.6)

## 2018-03-20 NOTE — Plan of Care (Signed)
Patient is alert and oriented to self, place and time.He denies having thoughts of wanting to hurt himself or anyone else. Patient still verbalizes still hearing voices states, "They are just telling me not to look at you ma'am and not to take my medicine". Patient took medications without any side effects noted. Patient is improving very slowly. He no longer walks around covering his eyes with his hands or looks away but is still obeying the voices commanding him no eye contact.  Patient seen in the milieu periodically, he comes out for meals and medications. No distress noted, thoughts are organized and coherent , 15 minutes safety checks maintained, will continue to monitor.

## 2018-03-20 NOTE — Progress Notes (Signed)
Recreation Therapy Notes  Date: 03/20/2018  Time: 9:30 am   Location: Outside   Behavioral response: N/A   Intervention Topic: Leisure  Discussion/Intervention: Patient did not attend group.   Clinical Observations/Feedback:  Patient did not attend group.   Louise Rawson LRT/CTRS        Caterra Ostroff 03/20/2018 12:47 PM

## 2018-03-20 NOTE — Progress Notes (Deleted)
Recreation Therapy Notes   Date: 03/20/2018  Time: 9:30 pm  Location: Outside  Behavioral response: Appropriate  Group Type: Leisure  Participation level: Active  Communication: Patient was social with peers and staff.  Comments: N/A  Ainslie Mazurek LRT/CTRS        Deitra Craine 03/20/2018 1:00 PM

## 2018-03-20 NOTE — [facility] Group Notes (Signed)
LCSW Group Therapy Note  03/20/2018 1:00 pm  Type of Therapy and Topic:  Group Therapy:  Feelings around Relapse and Recovery  Participation Level:  Did Not Attend   Description of Group:    Patients in this group will discuss emotions they experience before and after a relapse. They will process how experiencing these feelings, or avoidance of experiencing them, relates to having a relapse. Facilitator will guide patients to explore emotions they have related to recovery. Patients will be encouraged to process which emotions are more powerful. They will be guided to discuss the emotional reaction significant others in their lives may have to their relapse or recovery. Patients will be assisted in exploring ways to respond to the emotions of others without this contributing to a relapse.  Therapeutic Goals: 1. Patient will identify two or more emotions that lead to a relapse for them 2. Patient will identify two emotions that result when they relapse 3. Patient will identify two emotions related to recovery 4. Patient will demonstrate ability to communicate their needs through discussion and/or role plays   Summary of Patient Progress: Samay was invited to today's group, but chose not to attend.    Therapeutic Modalities:   Cognitive Behavioral Therapy Solution-Focused Therapy Assertiveness Training Relapse Prevention Therapy   Alease FrameSonya S Mattelyn Imhoff, LCSW 03/20/2018 1:57 PM

## 2018-03-20 NOTE — Consult Note (Signed)
[facility] Labs[facility]aren K Crystalann Korf, RPh MEDICATION RELATED CONSULT NOTE - INITIAL   Pharmacy Consult for Clozapine REMs reporting and Lab Monitoring  Indication: Schizoaffective disorder   Labs: Recent Labs    03/20/18 0710  WBC 4.6  HGB 15.7  HCT 45.4  PLT 194   Estimated Creatinine Clearance: 127 mL/min (by C-G formula based on SCr of 1.04 mg/dL).   Microbiology: No results found for this or any previous visit (from the past 720 hour(s)).  Medical History: Past Medical History:  Diagnosis Date  . Asthma   . Depression   . Psychosis [facility](HCC)     Assessment: Pharmacy consulted for clozapine lab monitoring and REMs Program Reporting in 22 yo male with PMH of Schizoaffective disorder. Patient prescribed Clozapine 300mg  PTA.   Plan:  8/23 ANC: 1800. Lab reported to online Clozapine REMs Program. Patient is eligible to receive clozapine with weekly lab monitoring.  Next CBC with Diff 09/06  08/30 ANC 1900   Ikran Patman K, [name] Center[facility]PH 03/20/2018 8:38 AM

## 2018-03-20 NOTE — [facility] Group Notes (Signed)
[facility] Group Notes:  (Nursing/MHT/Case Management/Adjunct)  Date:  03/20/2018  Time:  2:06 AM  Type of Therapy:  Group Therapy  Participation Level:  Did Not Attend   Caryn BeeJessica  Gwendlyn Hanback 03/20/2018, 2:06 AM

## 2018-03-21 NOTE — Progress Notes (Signed)
[facility] MD Progress Note  03/21/2018 12:39 PM [name] [name]  MRN:  [id number]  Subjective:   [name] [name] is a 22 year old male with a history of treatment resistant schizophrenia, multiple hospitalizations and treatment noncompliance. Both, the patient and his mother have no insight into mental illness. [name] [name] responds well to a combination of Clozapine and Depakote. [name] [name] decompensates quickly but his recover is usually protracted with catatonia and extended hospitalization. [name] [name] obeys voices telling him "no eye contact" and "stay in your room".  Today [name] [name] was still in bed around noon, with the cover over his head.  [name] [name] did speak with the writer for a few minutes, still reporting some voices. However, our RN still reports seeing pt responding to internal stimuli, facing at the well for a long period of time. Yet, [name] [name] is compliant with meds.   Principal Problem: Schizoaffective disorder, bipolar type (HCC) Diagnosis:   Patient Active Problem List   Diagnosis Date Noted  . Hypertension [I10] 03/06/2018  . Schizoaffective disorder, bipolar type (HCC) [F25.0] 12/30/2017  . Noncompliance [Z91.19] 12/30/2017  . Aggressive behavior of adolescent [R46.89] 10/24/2015   Total Time spent with patient: 15 minutes  Past Psychiatric History: schizophrenia  Past Medical History:  Past Medical History:  Diagnosis Date  . Asthma   . Depression   . Psychosis [facility])     Past Surgical History:  Procedure Laterality Date  . BACK SURGERY     Family History: History reviewed. No pertinent family history. Family Psychiatric  History: none Social History:  Social History   Substance and Sexual Activity  Alcohol Use No     Social History   Substance and Sexual Activity  Drug Use Yes  . Types: Marijuana    Social History   Socioeconomic History  . Marital status: Single    Spouse name: Not on file  . Number of children: Not on file  . Years of education: Not on file  . Highest education  level: Not on file  Occupational History  . Not on file  Social Needs  . Financial resource strain: Not on file  . Food insecurity:    Worry: Not on file    Inability: Not on file  . Transportation needs:    Medical: Not on file    Non-medical: Not on file  Tobacco Use  . Smoking status: Never Smoker  . Smokeless tobacco: Never Used  Substance and Sexual Activity  . Alcohol use: No  . Drug use: Yes    Types: Marijuana  . Sexual activity: Not Currently  Lifestyle  . Physical activity:    Days per week: Not on file    Minutes per session: Not on file  . Stress: Not on file  Relationships  . Social connections:    Talks on phone: Not on file    Gets together: Not on file    Attends religious service: Not on file    Active member of club or organization: Not on file    Attends meetings of clubs or organizations: Not on file    Relationship status: Not on file  Other Topics Concern  . Not on file  Social History Narrative  . Not on file   Additional Social History:   Sleep: Fair  Appetite:  Fair  Current Medications: Current Facility-Administered Medications  Medication Dose Route Frequency Provider Last Rate Last Dose  . acetaminophen (TYLENOL) tablet 650 mg  650 mg Oral Q6H PRN [name], [name], MD      .  alum & mag hydroxide-simeth (MAALOX/MYLANTA) 200-200-20 MG/5ML suspension 30 mL  30 mL Oral Q4H PRN [name], [name], MD      . cloZAPine (CLOZARIL) tablet 300 mg  300 mg Oral QHS [name], [name], MD   300 mg at 03/20/18 2129  . divalproex (DEPAKOTE) DR tablet 500 mg  500 mg Oral Q12H [name], [name], MD   500 mg at 03/21/18 0754  . magnesium hydroxide (MILK OF MAGNESIA) suspension 30 mL  30 mL Oral Daily PRN [name], [name], MD   30 mL at 03/17/18 0530  . metFORMIN (GLUCOPHAGE) tablet 500 mg  500 mg Oral Q breakfast [name], [name], MD   500 mg at 03/21/18 0754  . metoprolol tartrate (LOPRESSOR) tablet 12.5 mg  12.5 mg Oral BID [name], [name], MD   12.5 mg at  03/21/18 0754  . OLANZapine (ZYPREXA) injection 10 mg  10 mg Intramuscular BID PRN [name], [name], MD      . traZODone (DESYREL) tablet 100 mg  100 mg Oral QHS [name], [name], MD   100 mg at 03/20/18 2129    Lab Results:  Results for orders placed or performed during the hospital encounter of 03/07/18 (from the past 48 hour(s))  CBC with Differential/Platelet     Status: None   Collection Time: 03/20/18  7:10 AM  Result Value Ref Range   WBC 4.6 3.8 - 10.6 K/uL   RBC 5.56 4.40 - 5.90 MIL/uL   Hemoglobin 15.7 13.0 - 18.0 g/dL   HCT 82.945.4 56.240.0 - 13.052.0 %   MCV 81.7 80.0 - 100.0 fL   MCH 28.1 26.0 - 34.0 pg   MCHC 34.5 32.0 - 36.0 g/dL   RDW 86.512.9 78.411.5 - 69.614.5 %   Platelets 194 150 - 440 K/uL   Neutrophils Relative % 41 %   Neutro Abs 1.9 1.4 - 6.5 K/uL   Lymphocytes Relative 46 %   Lymphs Abs 2.1 1.0 - 3.6 K/uL   Monocytes Relative 13 %   Monocytes Absolute 0.6 0.2 - 1.0 K/uL   Eosinophils Relative 0 %   Eosinophils Absolute 0.0 0 - 0.7 K/uL   Basophils Relative 0 %   Basophils Absolute 0.0 0 - 0.1 K/uL    Comment: Performed at [facility], [address] [place], [place] [id number]    Blood Alcohol level:  Lab Results  Component Value Date   [facility] <10 03/06/2018   ETH <10 12/30/2017    Metabolic Disorder Labs: Lab Results  Component Value Date   HGBA1C 4.9 03/08/2018   MPG 93.93 03/08/2018   MPG 85.32 12/30/2017   No results found for: PROLACTIN Lab Results  Component Value Date   CHOL 238 (H) 03/08/2018   TRIG 69 03/08/2018   HDL 56 03/08/2018   CHOLHDL 4.3 03/08/2018   VLDL 14 03/08/2018   LDLCALC 168 (H) 03/08/2018   LDLCALC 97 12/30/2017    Physical Findings: AIMS: Facial and Oral Movements Muscles of Facial Expression: None, normal Lips and Perioral Area: None, normal Jaw: None, normal Tongue: None, normal,Extremity Movements Upper (arms, wrists, hands, fingers): None, normal Lower (legs, knees, ankles, toes): None, normal, Trunk  Movements Neck, shoulders, hips: None, normal, Overall Severity Severity of abnormal movements (highest score from questions above): None, normal Incapacitation due to abnormal movements: None, normal Patient's awareness of abnormal movements (rate only patient's report): No Awareness, Dental Status Current problems with teeth and/or dentures?: No Does patient usually wear dentures?: No  CIWA:  COWS:     Musculoskeletal: Strength & Muscle Tone: within normal limits Gait & Station: normal Patient leans: N/A  Psychiatric Specialty Exam: Physical Exam  Nursing note and vitals reviewed. Psychiatric: His affect is blunt. His speech is delayed. [name] [name] is slowed, withdrawn and actively hallucinating. Thought content is delusional. Cognition and memory are impaired. [name] [name] expresses impulsivity.    Review of Systems  Neurological: Negative.   Psychiatric/Behavioral: Positive for hallucinations.  All other systems reviewed and are negative.   Blood pressure 116/83, pulse 79, temperature 97.6 F (36.4 C), temperature source Oral, resp. rate 20, height 6' 0.44" (1.84 m), weight 86.2 kg, SpO2 100 %.Body mass index is 25.46 kg/m.  General Appearance: Casual  Eye Contact:  Absent  Speech:  Slow and poverty of speech  Volume:  Decreased  Mood:  "fine"  Affect:  Blunt  Thought Process:  Disorganized, Irrelevant and Descriptions of Associations: Tangential  Orientation:  Full (Time, Place, and Person)  Thought Content:  Delusions, Hallucinations: Auditory and Paranoid Ideation  Suicidal Thoughts:  No  Homicidal Thoughts:  No  Memory:  Negative  Judgement:  Poor  Insight:  Lacking  Psychomotor Activity:  Psychomotor Retardation  Concentration:  Concentration: Poor and Attention Span: Poor  Recall:  Fair  Fund of Knowledge:  Poor  Language:  Poor  Akathisia:  No  Handed:  Right  AIMS (if indicated):     Assets:  Desire for Improvement Financial Resources/Insurance Housing Physical  Health Resilience Social Support  ADL's:  Intact  Cognition:  WNL  Sleep:  Number of Hours: 7.45     Treatment Plan Summary: Daily contact with patient to assess and evaluate symptoms and progress in treatment and Medication management   [name] [name] is a 22 year old male with a history of schizophrenia admitted for another psychotic break in the context of treatment noncompliance.The patient is still floridly psychotic and bizarre. [name] [name] appears to be compliant with medications but his progress has been very slow. The patient has not been able to participate in discharge planning or programming. [name] [name] was referred to [facility] ACT team.   #Psychosis, not improving -continueClozapine 300 mg nightly, Clo level pending -continueDepakote 500 mg BID,VPA level 73on 8/23 -discontinueoral Invega 9 mg daily, it is ineffective and possibly lowers ANC -Trazodone 150 mg nightly  #Metabolic syndrome prevention -Metformin 500 mg BID  #Tachycardia, likely from Clozapine, resolved with treatment -Metoprolol 12.5 BID  #Labs were obtainedrecently  #Social -patient would benefit from help of the guardian -patient, in my opinion, should be considered disabled  #Disposition -discharge to home with the mother -needs family meeting -[name] team to assess the patient next week  Hana Trippett, MD 03/21/2018, 12:39 PM

## 2018-03-21 NOTE — Progress Notes (Addendum)
Received [name] [name] this AM in his room, awaken for breakfast and he went to the dinning room to eat. He verbalized the voices are better today and cannot hear what they are saying. He remains isolated to his room and refused to join his peers in the courtyard. This afternoon he was standing in his room looking at the wall and did not make eye contact with this Clinical research associatewriter.

## 2018-03-21 NOTE — [facility] Group Notes (Signed)
LCSW Group Therapy Note  03/21/2018 1:15pm  Type of Therapy and Topic:  Group Therapy:  Cognitive Distortions  Participation Level:  Did Not Attend   Description of Group:    Patients in this group will be introduced to the topic of cognitive distortions.  Patients will identify and describe cognitive distortions, describe the feelings these distortions create for them.  Patients will identify one or more situations in their personal life where they have cognitively distorted thinking and will verbalize challenging this cognitive distortion through positive thinking skills.  Patients will practice the skill of using positive affirmations to challenge cognitive distortions using affirmation cards.    Therapeutic Goals:  1. Patient will identify two or more cognitive distortions they have used 2. Patient will identify one or more emotions that stem from use of a cognitive distortion 3. Patient will demonstrate use of a positive affirmation to counter a cognitive distortion through discussion and/or role play. 4. Patient will describe one way cognitive distortions can be detrimental to wellness   Summary of Patient Progress: Pt was invited to attend group but chose not to attend. CSW will continue to encourage pt to attend group throughout their admission.      Therapeutic Modalities:   Cognitive Behavioral Therapy Motivational Interviewing   Cherika Jessie  CUEBAS-COLON, LCSW 03/21/2018 12:48 PM   

## 2018-03-21 NOTE — Plan of Care (Signed)
Patient is not coping well and refusing any attempts to support him or to let him cope, refuse any coping skills, facing  the wall most of the time to avoid seeing people but will take his medicines, through his back, responding to external or internal stimuli , come out for meals, cover his face with his palm, isolating to his room and stand against the wall, would not endorse  or deny SI/HI  and AVH, room is close to nurse station within eyesight , safety is maintained , 15 minute rounding is in progress no distress noted. Problem: Education: Goal: Ability to state activities that reduce stress will improve Outcome: Progressing   Problem: Coping: Goal: Ability to identify and develop effective coping behavior will improve Outcome: Progressing   Problem: Self-Concept: Goal: Ability to identify factors that promote anxiety will improve Outcome: Progressing Goal: Level of anxiety will decrease Outcome: Progressing Goal: Ability to modify response to factors that promote anxiety will improve Outcome: Progressing   Problem: Education: Goal: Knowledge of General Education information will improve Description Including pain rating scale, medication(s)/side effects and non-pharmacologic comfort measures Outcome: Progressing   Problem: Health Behavior/Discharge Planning: Goal: Ability to manage health-related needs will improve Outcome: Progressing   Problem: Clinical Measurements: Goal: Ability to maintain clinical measurements within normal limits will improve Outcome: Progressing Goal: Will remain free from infection Outcome: Progressing Goal: Diagnostic test results will improve Outcome: Progressing Goal: Respiratory complications will improve Outcome: Progressing Goal: Cardiovascular complication will be avoided Outcome: Progressing   Problem: Activity: Goal: Risk for activity intolerance will decrease Outcome: Progressing   Problem: Nutrition: Goal: Adequate nutrition will  be maintained Outcome: Progressing   Problem: Coping: Goal: Level of anxiety will decrease Outcome: Progressing   Problem: Elimination: Goal: Will not experience complications related to bowel motility Outcome: Progressing Goal: Will not experience complications related to urinary retention Outcome: Progressing   Problem: Pain Managment: Goal: General experience of comfort will improve Outcome: Progressing   Problem: Safety: Goal: Ability to remain free from injury will improve Outcome: Progressing   Problem: Skin Integrity: Goal: Risk for impaired skin integrity will decrease Outcome: Progressing

## 2018-03-22 NOTE — Progress Notes (Signed)
[facility] MD Progress Note  03/22/2018 2:20 PM Hovanes SHULEM MADER  MRN:  [id number]  Subjective:   Mr. Martine is a 22 year old male with a history of treatment resistant schizophrenia, multiple hospitalizations and treatment noncompliance. Both, the patient and his mother have no insight into mental illness. Veto Macqueen responds well to a combination of Clozapine and Depakote. Shishir Krantz decompensates quickly but his recover is usually protracted with catatonia and extended hospitalization. Gissela Bloch obeys voices telling him "no eye contact" and "stay in your room".  Today Mr. Macmaster was in bed later in the morning. Willies Laviolette did not want to talk much, but did deny AVH. Talene Glastetter wanted his breakfast tray "re-ordered" because Sharlie Shreffler missed it.  Srihan Brutus agreed to go to Groups.  Overall, not much changed.    Principal Problem: Schizoaffective disorder, bipolar type (HCC) Diagnosis:   Patient Active Problem List   Diagnosis Date Noted  . Hypertension [I10] 03/06/2018  . Schizoaffective disorder, bipolar type (HCC) [F25.0] 12/30/2017  . Noncompliance [Z91.19] 12/30/2017  . Aggressive behavior of adolescent [R46.89] 10/24/2015   Total Time spent with patient: 15 minutes  Past Psychiatric History: schizophrenia  Past Medical History:  Past Medical History:  Diagnosis Date  . Asthma   . Depression   . Psychosis [facility])     Past Surgical History:  Procedure Laterality Date  . BACK SURGERY     Family History: History reviewed. No pertinent family history. Family Psychiatric  History: none Social History:  Social History   Substance and Sexual Activity  Alcohol Use No     Social History   Substance and Sexual Activity  Drug Use Yes  . Types: Marijuana    Social History   Socioeconomic History  . Marital status: Single    Spouse name: Not on file  . Number of children: Not on file  . Years of education: Not on file  . Highest education level: Not on file  Occupational History  . Not on file  Social Needs  . Financial  resource strain: Not on file  . Food insecurity:    Worry: Not on file    Inability: Not on file  . Transportation needs:    Medical: Not on file    Non-medical: Not on file  Tobacco Use  . Smoking status: Never Smoker  . Smokeless tobacco: Never Used  Substance and Sexual Activity  . Alcohol use: No  . Drug use: Yes    Types: Marijuana  . Sexual activity: Not Currently  Lifestyle  . Physical activity:    Days per week: Not on file    Minutes per session: Not on file  . Stress: Not on file  Relationships  . Social connections:    Talks on phone: Not on file    Gets together: Not on file    Attends religious service: Not on file    Active member of club or organization: Not on file    Attends meetings of clubs or organizations: Not on file    Relationship status: Not on file  Other Topics Concern  . Not on file  Social History Narrative  . Not on file   Additional Social History:   Sleep: Fair  Appetite:  Fair  Current Medications: Current Facility-Administered Medications  Medication Dose Route Frequency Provider Last Rate Last Dose  . acetaminophen (TYLENOL) tablet 650 mg  650 mg Oral Q6H PRN [name], [name], MD      . alum & mag hydroxide-simeth (MAALOX/MYLANTA) 200-200-20 MG/5ML suspension 30 mL  30 mL Oral Q4H PRN [name], [name], MD      . cloZAPine (CLOZARIL) tablet 300 mg  300 mg Oral QHS [name], [name], MD   300 mg at 03/21/18 2128  . divalproex (DEPAKOTE) DR tablet 500 mg  500 mg Oral Q12H [name], [name], MD   500 mg at 03/22/18 0858  . magnesium hydroxide (MILK OF MAGNESIA) suspension 30 mL  30 mL Oral Daily PRN [name], [name], MD   30 mL at 03/17/18 0530  . metFORMIN (GLUCOPHAGE) tablet 500 mg  500 mg Oral Q breakfast [name], [name], MD   500 mg at 03/22/18 0858  . metoprolol tartrate (LOPRESSOR) tablet 12.5 mg  12.5 mg Oral BID [name], [name], MD   12.5 mg at 03/21/18 1715  . OLANZapine (ZYPREXA) injection 10 mg  10 mg Intramuscular BID PRN  [name], [name], MD      . traZODone (DESYREL) tablet 100 mg  100 mg Oral QHS [name], [name], MD   100 mg at 03/21/18 2128    Lab Results:  No results found for this or any previous visit (from the past 48 hour(s)).  Blood Alcohol level:  Lab Results  Component Value Date   ETH <10 03/06/2018   ETH <10 12/30/2017    Metabolic Disorder Labs: Lab Results  Component Value Date   HGBA1C 4.9 03/08/2018   MPG 93.93 03/08/2018   MPG 85.32 12/30/2017   No results found for: PROLACTIN Lab Results  Component Value Date   CHOL 238 (H) 03/08/2018   TRIG 69 03/08/2018   HDL 56 03/08/2018   CHOLHDL 4.3 03/08/2018   VLDL 14 03/08/2018   LDLCALC 168 (H) 03/08/2018   LDLCALC 97 12/30/2017    Physical Findings: AIMS: Facial and Oral Movements Muscles of Facial Expression: None, normal Lips and Perioral Area: None, normal Jaw: None, normal Tongue: None, normal,Extremity Movements Upper (arms, wrists, hands, fingers): None, normal Lower (legs, knees, ankles, toes): None, normal, Trunk Movements Neck, shoulders, hips: None, normal, Overall Severity Severity of abnormal movements (highest score from questions above): None, normal Incapacitation due to abnormal movements: None, normal Patient's awareness of abnormal movements (rate only patient's report): No Awareness, Dental Status Current problems with teeth and/or dentures?: No Does patient usually wear dentures?: No  CIWA:    COWS:     Musculoskeletal: Strength & Muscle Tone: within normal limits Gait & Station: normal Patient leans: N/A  Psychiatric Specialty Exam: Physical Exam  Nursing note and vitals reviewed. Psychiatric: His affect is blunt. His speech is delayed. Umeka Wrench is slowed, withdrawn and actively hallucinating. Thought content is delusional. Cognition and memory are impaired. Willer Osorno expresses impulsivity.    Review of Systems  Neurological: Negative.   Psychiatric/Behavioral: Positive for hallucinations.  All  other systems reviewed and are negative.   Blood pressure (!) 89/58, pulse 72, temperature 97.6 F (36.4 C), temperature source Oral, resp. rate 18, height 6' 0.44" (1.84 m), weight 86.2 kg, SpO2 100 %.Body mass index is 25.46 kg/m.  General Appearance: Casual  Eye Contact:  Absent  Speech:  Slow and poverty of speech  Volume:  Decreased  Mood:  "ok"  Affect:  Blunt  Thought Process:  Disorganized, Irrelevant and Descriptions of Associations: Tangential  Orientation:  Full (Time, Place, and Person)  Thought Content:  Delusions, Hallucinations: Auditory and Paranoid Ideation  Suicidal Thoughts:  No  Homicidal Thoughts:  No  Memory:  Negative  Judgement:  Poor  Insight:  Lacking  Psychomotor  Activity:  Psychomotor Retardation  Concentration:  Concentration: Poor and Attention Span: Poor  Recall:  Fair  Fund of Knowledge:  Poor  Language:  Poor  Akathisia:  No  Handed:  Right  AIMS (if indicated):     Assets:  Desire for Improvement Financial Resources/Insurance Housing Physical Health Resilience Social Support  ADL's:  Intact  Cognition:  WNL  Sleep:  Number of Hours: 7.45     Treatment Plan Summary: Daily contact with patient to assess and evaluate symptoms and progress in treatment and Medication management   Mr. Brean is a 22 year old male with a history of schizophrenia admitted for another psychotic break in the context of treatment noncompliance.The patient is still floridly psychotic and bizarre. Marleni Gallardo appears to be compliant with medications but his progress has been very slow. The patient has not been able to participate in discharge planning or programming. Denali Sharma was referred to [facility] ACT team.   #Psychosis, not improving -continueClozapine 300 mg nightly, Clo level pending -continueDepakote 500 mg BID,VPA level 73on 8/23 -discontinueoral Invega 9 mg daily, it is ineffective and possibly lowers ANC -Trazodone 150 mg nightly  #Metabolic syndrome  prevention -Metformin 500 mg BID  #Tachycardia, likely from Clozapine, resolved with treatment -Metoprolol 12.5 BID  #Labs were obtainedrecently  #Social -patient would benefit from help of the guardian -patient, in my opinion, should be considered disabled  #Disposition -discharge to home with the mother -needs family meeting -[name] ACT team to assess the patient next week  Cherrish Vitali, MD 03/22/2018, 2:20 PM

## 2018-03-22 NOTE — Progress Notes (Addendum)
[facility] MD Progress Note  03/23/2018 2:58 PM [name] [name]  MRN:  [id number]  Subjective:   There is minimal improvement. The patient is able to utter more but still no eye contact or emotional connection. Accepte medications and voices no complaints.  Principal Problem: Schizoaffective disorder, bipolar type (HCC) Diagnosis:   Patient Active Problem List   Diagnosis Date Noted  . Schizoaffective disorder, bipolar type (HCC) [F25.0] 12/30/2017    Priority: High  . Hypertension [I10] 03/06/2018  . Noncompliance [Z91.19] 12/30/2017  . Aggressive behavior of adolescent [R46.89] 10/24/2015   Total Time spent with patient: 15 minutes  Past Psychiatric History: schizophrenia  Past Medical History:  Past Medical History:  Diagnosis Date  . Asthma   . Depression   . Psychosis [facility])     Past Surgical History:  Procedure Laterality Date  . BACK SURGERY     Family History: History reviewed. No pertinent family history. Family Psychiatric  History: none Social History:  Social History   Substance and Sexual Activity  Alcohol Use No     Social History   Substance and Sexual Activity  Drug Use Yes  . Types: Marijuana    Social History   Socioeconomic History  . Marital status: Single    Spouse name: Not on file  . Number of children: Not on file  . Years of education: Not on file  . Highest education level: Not on file  Occupational History  . Not on file  Social Needs  . Financial resource strain: Not on file  . Food insecurity:    Worry: Not on file    Inability: Not on file  . Transportation needs:    Medical: Not on file    Non-medical: Not on file  Tobacco Use  . Smoking status: Never Smoker  . Smokeless tobacco: Never Used  Substance and Sexual Activity  . Alcohol use: No  . Drug use: Yes    Types: Marijuana  . Sexual activity: Not Currently  Lifestyle  . Physical activity:    Days per week: Not on file    Minutes per session: Not on file  . Stress:  Not on file  Relationships  . Social connections:    Talks on phone: Not on file    Gets together: Not on file    Attends religious service: Not on file    Active member of club or organization: Not on file    Attends meetings of clubs or organizations: Not on file    Relationship status: Not on file  Other Topics Concern  . Not on file  Social History Narrative  . Not on file   Additional Social History:                         Sleep: Fair  Appetite:  Fair  Current Medications: Current Facility-Administered Medications  Medication Dose Route Frequency Provider Last Rate Last Dose  . acetaminophen (TYLENOL) tablet 650 mg  650 mg Oral Q6H PRN [name], [name], MD      . alum & mag hydroxide-simeth (MAALOX/MYLANTA) 200-200-20 MG/5ML suspension 30 mL  30 mL Oral Q4H PRN [name], [name], MD      . cloZAPine (CLOZARIL) tablet 300 mg  300 mg Oral QHS [name], [name], MD   300 mg at 03/22/18 2124  . divalproex (DEPAKOTE) DR tablet 500 mg  500 mg Oral Q12H [name], [name], MD   500 mg at 03/23/18 5784  .  magnesium hydroxide (MILK OF MAGNESIA) suspension 30 mL  30 mL Oral Daily PRN [name], [name], MD   30 mL at 03/17/18 0530  . metFORMIN (GLUCOPHAGE) tablet 500 mg  500 mg Oral Q breakfast [name], [name], MD   500 mg at 03/23/18 2725  . metoprolol tartrate (LOPRESSOR) tablet 12.5 mg  12.5 mg Oral BID [name], [name], MD   12.5 mg at 03/23/18 0828  . OLANZapine (ZYPREXA) injection 10 mg  10 mg Intramuscular BID PRN [name], [name], MD      . traZODone (DESYREL) tablet 100 mg  100 mg Oral QHS [name], [name], MD   100 mg at 03/22/18 2124    Lab Results: No results found for this or any previous visit (from the past 48 hour(s)).  Blood Alcohol level:  Lab Results  Component Value Date   ETH <10 03/06/2018   ETH <10 12/30/2017    Metabolic Disorder Labs: Lab Results  Component Value Date   HGBA1C 4.9 03/08/2018   MPG 93.93 03/08/2018   MPG 85.32 12/30/2017   No  results found for: PROLACTIN Lab Results  Component Value Date   CHOL 238 (H) 03/08/2018   TRIG 69 03/08/2018   HDL 56 03/08/2018   CHOLHDL 4.3 03/08/2018   VLDL 14 03/08/2018   LDLCALC 168 (H) 03/08/2018   LDLCALC 97 12/30/2017    Physical Findings: AIMS: Facial and Oral Movements Muscles of Facial Expression: None, normal Lips and Perioral Area: None, normal Jaw: None, normal Tongue: None, normal,Extremity Movements Upper (arms, wrists, hands, fingers): None, normal Lower (legs, knees, ankles, toes): None, normal, Trunk Movements Neck, shoulders, hips: None, normal, Overall Severity Severity of abnormal movements (highest score from questions above): None, normal Incapacitation due to abnormal movements: None, normal Patient's awareness of abnormal movements (rate only patient's report): No Awareness, Dental Status Current problems with teeth and/or dentures?: No Does patient usually wear dentures?: No  CIWA:    COWS:     Musculoskeletal: Strength & Muscle Tone: within normal limits Gait & Station: normal Patient leans: N/A  Psychiatric Specialty Exam: Physical Exam  Nursing note and vitals reviewed. Psychiatric: His affect is blunt. His speech is delayed. He is slowed, withdrawn and actively hallucinating. Thought content is delusional. Cognition and memory are impaired. He expresses impulsivity.    Review of Systems  Neurological: Negative.   Psychiatric/Behavioral: Positive for hallucinations.  All other systems reviewed and are negative.   Blood pressure 117/62, pulse (!) 110, temperature 97.8 F (36.6 C), temperature source Oral, resp. rate 18, height 6' 0.44" (1.84 m), weight 86.2 kg, SpO2 100 %.Body mass index is 25.46 kg/m.  General Appearance: Casual  Eye Contact:  Absent  Speech:  Blocked  Volume:  Decreased  Mood:  Euthymic  Affect:  Blunt  Thought Process:  Disorganized, Irrelevant and Descriptions of Associations: Tangential  Orientation:  Full  (Time, Place, and Person)  Thought Content:  Illogical, Delusions, Hallucinations: Auditory and Paranoid Ideation  Suicidal Thoughts:  No  Homicidal Thoughts:  No  Memory:  Immediate;   Fair Recent;   Fair Remote;   Fair  Judgement:  Poor  Insight:  Lacking  Psychomotor Activity:  Psychomotor Retardation  Concentration:  Concentration: Poor and Attention Span: Poor  Recall:  Poor  Fund of Knowledge:  Fair  Language:  Poor  Akathisia:  No  Handed:  Right  AIMS (if indicated):     Assets:  Desire for Improvement Financial Resources/Insurance Housing Physical Health Resilience Social  Support  ADL's:  Intact  Cognition:  WNL  Sleep:  Number of Hours: 7.5     Treatment Plan Summary: Daily contact with patient to assess and evaluate symptoms and progress in treatment and Medication management   [name] [name] is a 22 year old male with a history of schizophrenia admitted for another psychotic break in the context of treatment noncompliance.The patient is still floridly psychotic andbizarre. He appears to be compliant with medications but his progress has been very slow. The patient has not been able to participate in discharge planningor programming.He was referred to [facility] ACT team.   #Psychosis, not improving -continueClozapine 300 mg nightly, Clo level pending -continueDepakote 500 mg BID,VPA level 73on 8/23 -start Trilafon 4 mg BID for psychosis -Trazodone 150 mg nightly  #Metabolic syndrome prevention -Metformin 500 mg BID  #Tachycardia, likely from Clozapine, resolved with treatment -Metoprolol 12.5 BID  #Labs were obtainedrecently  #Social -patient would benefit from help of the guardian -patient, in my opinion, should be considered disabled  #Disposition -discharge to home with the mother -needs family meeting -[name] ACT team to assess the patient next week  Kristine Linea, MD 03/23/2018, 2:58 PM

## 2018-03-22 NOTE — Progress Notes (Signed)
Received [name] [name] this AM in his bed, he slept through breakfast, but got up for lunch. He continues to be isolated to his room with intervals of lying in  the bed with his head completely covered. He has been compliant with his medications. He was noted standing in his room in the middle of the floor without eye contact this PM. He requested to eat dinner in his room, but was encouraged to go to the dinning room. He sat facing the wall at a table unoccupied by his peers.

## 2018-03-22 NOTE — Plan of Care (Signed)
Patient is lacking coping skills, isolating self in his room , cover his face and facing the wall , hiding his face, states I don't want to be bordered, patient is safe and monitored every 15 minutes and often , no distress. Takes his medicines.,   Problem: Education: Goal: Ability to state activities that reduce stress will improve Outcome: Progressing   Problem: Coping: Goal: Ability to identify and develop effective coping behavior will improve Outcome: Progressing   Problem: Self-Concept: Goal: Ability to identify factors that promote anxiety will improve Outcome: Progressing Goal: Level of anxiety will decrease Outcome: Progressing Goal: Ability to modify response to factors that promote anxiety will improve Outcome: Progressing   Problem: Education: Goal: Knowledge of General Education information will improve Description Including pain rating scale, medication(s)/side effects and non-pharmacologic comfort measures Outcome: Progressing   Problem: Health Behavior/Discharge Planning: Goal: Ability to manage health-related needs will improve Outcome: Progressing   Problem: Clinical Measurements: Goal: Ability to maintain clinical measurements within normal limits will improve Outcome: Progressing Goal: Will remain free from infection Outcome: Progressing Goal: Diagnostic test results will improve Outcome: Progressing Goal: Respiratory complications will improve Outcome: Progressing Goal: Cardiovascular complication will be avoided Outcome: Progressing   Problem: Activity: Goal: Risk for activity intolerance will decrease Outcome: Progressing   Problem: Nutrition: Goal: Adequate nutrition will be maintained Outcome: Progressing   Problem: Coping: Goal: Level of anxiety will decrease Outcome: Progressing   Problem: Elimination: Goal: Will not experience complications related to bowel motility Outcome: Progressing Goal: Will not experience complications related  to urinary retention Outcome: Progressing   Problem: Pain Managment: Goal: General experience of comfort will improve Outcome: Progressing   Problem: Safety: Goal: Ability to remain free from injury will improve Outcome: Progressing   Problem: Skin Integrity: Goal: Risk for impaired skin integrity will decrease Outcome: Progressing

## 2018-03-22 NOTE — [facility] Group Notes (Signed)
LCSW Group Therapy Note 03/22/2018 1:15pm  Type of Therapy and Topic: Group Therapy: Feelings Around Returning Home & Establishing a Supportive Framework and Supporting Oneself When Supports Not Available  Participation Level: Did Not Attend  Description of Group:  Patients first processed thoughts and feelings about upcoming discharge. These included fears of upcoming changes, lack of change, new living environments, judgements and expectations from others and overall stigma of mental health issues. The group then discussed the definition of a supportive framework, what that looks and feels like, and how do to discern it from an unhealthy non-supportive network. The group identified different types of supports as well as what to do when your family/friends are less than helpful or unavailable  Therapeutic Goals  1. Patient will identify one healthy supportive network that they can use at discharge. 2. Patient will identify one factor of a supportive framework and how to tell it from an unhealthy network. 3. Patient able to identify one coping skill to use when they do not have positive supports from others. 4. Patient will demonstrate ability to communicate their needs through discussion and/or role plays.  Summary of Patient Progress:  Pt was invited to attend group but chose not to attend. CSW will continue to encourage pt to attend group throughout their admission.   Therapeutic Modalities Cognitive Behavioral Therapy Motivational Interviewing   Tyjanae Bartek  CUEBAS-COLON, LCSW 03/22/2018 2:38 PM 

## 2018-03-23 MED ORDER — PERPHENAZINE 4 MG PO TABS
4.0000 mg | ORAL_TABLET | Freq: Two times a day (BID) | ORAL | Status: DC
  Administered 2018-03-23 – 2018-03-26 (×6): 4 mg via ORAL
  Filled 2018-03-23 (×6): qty 1

## 2018-03-23 NOTE — Plan of Care (Addendum)
Patient found awake in bed upon my arrival. Patient is neither visible nor social this evening. Patient remains isolative to room throughout the evening. Remains unable to make eye contact or turn his face towards others. Patient is responding to internal stimuli and reports continued AVH. Denies SI/HI. Patient remains polite and cooperative. Ate snack but only when brought to his room. Compliant with HS medications but only when brought to his room. Reports eating and voiding adequately. Did not attend group. Q 15 minute checks maintained. Will continue to monitor throughout the shift. Patient slept 7.25 hours. No apparent distress. Will endorse care to oncoming shift.  Problem: Education: Goal: Ability to state activities that reduce stress will improve Outcome: Not Progressing   Problem: Coping: Goal: Ability to identify and develop effective coping behavior will improve Outcome: Not Progressing   Problem: Self-Concept: Goal: Ability to identify factors that promote anxiety will improve Outcome: Not Progressing Goal: Level of anxiety will decrease Outcome: Not Progressing

## 2018-03-23 NOTE — Progress Notes (Signed)
[facility] MD Progress Note  03/24/2018 12:00 PM [name] [name]  MRN:  [id number]  Subjective:   Mr. Hanning is in bed this morning with his head covered. He denies any problems and is asking about discharge but easily gives up when explained that he is not ready. Some of his slowness may be coming from elevated ammonia. We are forced to discontinue Depakote.   He did meet with [name] from [facility] ACT team and was accepted. We now need to convince his mother to cooperate as well.   Principal Problem: Schizoaffective disorder, bipolar type (HCC) Diagnosis:   Patient Active Problem List   Diagnosis Date Noted  . Schizoaffective disorder, bipolar type (HCC) [F25.0] 12/30/2017    Priority: High  . Hypertension [I10] 03/06/2018  . Noncompliance [Z91.19] 12/30/2017  . Aggressive behavior of adolescent [R46.89] 10/24/2015   Total Time spent with patient: 15 minutes  Past Psychiatric History: schizophrenia  Past Medical History:  Past Medical History:  Diagnosis Date  . Asthma   . Depression   . Psychosis [facility])     Past Surgical History:  Procedure Laterality Date  . BACK SURGERY     Family History: History reviewed. No pertinent family history. Family Psychiatric  History: none Social History:  Social History   Substance and Sexual Activity  Alcohol Use No     Social History   Substance and Sexual Activity  Drug Use Yes  . Types: Marijuana    Social History   Socioeconomic History  . Marital status: Single    Spouse name: Not on file  . Number of children: Not on file  . Years of education: Not on file  . Highest education level: Not on file  Occupational History  . Not on file  Social Needs  . Financial resource strain: Not on file  . Food insecurity:    Worry: Not on file    Inability: Not on file  . Transportation needs:    Medical: Not on file    Non-medical: Not on file  Tobacco Use  . Smoking status: Never Smoker  . Smokeless tobacco: Never Used   Substance and Sexual Activity  . Alcohol use: No  . Drug use: Yes    Types: Marijuana  . Sexual activity: Not Currently  Lifestyle  . Physical activity:    Days per week: Not on file    Minutes per session: Not on file  . Stress: Not on file  Relationships  . Social connections:    Talks on phone: Not on file    Gets together: Not on file    Attends religious service: Not on file    Active member of club or organization: Not on file    Attends meetings of clubs or organizations: Not on file    Relationship status: Not on file  Other Topics Concern  . Not on file  Social History Narrative  . Not on file   Additional Social History:                         Sleep: Fair  Appetite:  Fair  Current Medications: Current Facility-Administered Medications  Medication Dose Route Frequency Provider Last Rate Last Dose  . acetaminophen (TYLENOL) tablet 650 mg  650 mg Oral Q6H PRN [name], [name], MD      . alum & mag hydroxide-simeth (MAALOX/MYLANTA) 200-200-20 MG/5ML suspension 30 mL  30 mL Oral Q4H PRN [name], [name], MD      .  cloZAPine (CLOZARIL) tablet 300 mg  300 mg Oral QHS [name], [name], MD   300 mg at 03/23/18 2106  . magnesium hydroxide (MILK OF MAGNESIA) suspension 30 mL  30 mL Oral Daily PRN [name], [name], MD   30 mL at 03/17/18 0530  . metFORMIN (GLUCOPHAGE) tablet 500 mg  500 mg Oral Q breakfast [name], [name], MD   500 mg at 03/24/18 0748  . metoprolol tartrate (LOPRESSOR) tablet 12.5 mg  12.5 mg Oral BID [name], [name], MD   12.5 mg at 03/24/18 0748  . perphenazine (TRILAFON) tablet 4 mg  4 mg Oral BID Divina Neale B, MD   4 mg at 03/24/18 0749  . traZODone (DESYREL) tablet 100 mg  100 mg Oral QHS [name], [name], MD   100 mg at 03/23/18 2106    Lab Results:  Results for orders placed or performed during the hospital encounter of 03/07/18 (from the past 48 hour(s))  Valproic acid level     Status: None   Collection Time: 03/24/18  7:33 AM   Result Value Ref Range   Valproic Acid Lvl 99 50.0 - 100.0 ug/mL    Comment: Performed at [facility], [address] [place], [place] [address]  Ammonia     Status: Abnormal   Collection Time: 03/24/18  7:33 AM  Result Value Ref Range   Ammonia 57 (H) 9 - 35 umol/L    Comment: Performed at [facility], [address] [place], [place] [address]  CBC with Differential/Platelet     Status: None   Collection Time: 03/24/18  7:33 AM  Result Value Ref Range   WBC 4.7 3.8 - 10.6 K/uL   RBC 5.88 4.40 - 5.90 MIL/uL   Hemoglobin 16.6 13.0 - 18.0 g/dL   HCT 09.8 11.9 - 14.7 %   MCV 81.5 80.0 - 100.0 fL   MCH 28.3 26.0 - 34.0 pg   MCHC 34.7 32.0 - 36.0 g/dL   RDW 82.9 56.2 - 13.0 %   Platelets 226 150 - 440 K/uL   Neutrophils Relative % 47 %   Neutro Abs 2.2 1.4 - 6.5 K/uL   Lymphocytes Relative 43 %   Lymphs Abs 2.0 1.0 - 3.6 K/uL   Monocytes Relative 10 %   Monocytes Absolute 0.5 0.2 - 1.0 K/uL   Eosinophils Relative 0 %   Eosinophils Absolute 0.0 0 - 0.7 K/uL   Basophils Relative 0 %   Basophils Absolute 0.0 0 - 0.1 K/uL    Comment: Performed at [facility], [address] [place], [place] [address]    Blood Alcohol level:  Lab Results  Component Value Date   [facility] <10 03/06/2018   ETH <10 12/30/2017    Metabolic Disorder Labs: Lab Results  Component Value Date   HGBA1C 4.9 03/08/2018   MPG 93.93 03/08/2018   MPG 85.32 12/30/2017   No results found for: PROLACTIN Lab Results  Component Value Date   CHOL 238 (H) 03/08/2018   TRIG 69 03/08/2018   HDL 56 03/08/2018   CHOLHDL 4.3 03/08/2018   VLDL 14 03/08/2018   LDLCALC 168 (H) 03/08/2018   LDLCALC 97 12/30/2017    Physical Findings: AIMS: Facial and Oral Movements Muscles of Facial Expression: None, normal Lips and Perioral Area: None, normal Jaw: None, normal Tongue: None, normal,Extremity Movements Upper (arms, wrists, hands, fingers): None, normal Lower (legs, knees,  ankles, toes): None, normal, Trunk Movements Neck, shoulders, hips: None, normal, Overall  Severity Severity of abnormal movements (highest score from questions above): None, normal Incapacitation due to abnormal movements: None, normal Patient's awareness of abnormal movements (rate only patient's report): No Awareness, Dental Status Current problems with teeth and/or dentures?: No Does patient usually wear dentures?: No  CIWA:    COWS:     Musculoskeletal: Strength & Muscle Tone: within normal limits Gait & Station: normal Patient leans: N/A  Psychiatric Specialty Exam: Physical Exam  Nursing note and vitals reviewed. Psychiatric: His affect is blunt and inappropriate. His speech is delayed. He is slowed, withdrawn and actively hallucinating. Thought content is paranoid. Cognition and memory are impaired. He expresses impulsivity.    Review of Systems  Neurological: Negative.   Psychiatric/Behavioral: Positive for hallucinations.  All other systems reviewed and are negative.   Blood pressure 104/69, pulse 82, temperature 98.4 F (36.9 C), resp. rate 18, height 6' 0.44" (1.84 m), weight 86.2 kg, SpO2 100 %.Body mass index is 25.46 kg/m.  General Appearance: Fairly Groomed and strong body odor  Eye Contact:  Absent  Speech:  Slow  Volume:  Decreased  Mood:  Euthymic  Affect:  Blunt  Thought Process:  Disorganized, Irrelevant and Descriptions of Associations: Tangential  Orientation:  Full (Time, Place, and Person)  Thought Content:  Delusions, Hallucinations: Auditory and Paranoid Ideation  Suicidal Thoughts:  No  Homicidal Thoughts:  No  Memory:  Immediate;   Poor Recent;   Poor Remote;   Poor  Judgement:  Poor  Insight:  Lacking  Psychomotor Activity:  Normal  Concentration:  Concentration: Fair and Attention Span: Poor  Recall:  Poor  Fund of Knowledge:  Poor  Language:  Poor  Akathisia:  No  Handed:  Right  AIMS (if indicated):     Assets:  Communication  Skills Desire for Improvement Financial Resources/Insurance Housing Physical Health Resilience Social Support  ADL's:  Intact  Cognition:  WNL  Sleep:  Number of Hours: 7.25     Treatment Plan Summary: Daily contact with patient to assess and evaluate symptoms and progress in treatment and Medication management   Mr. Fluharty is a 22 year old male with a history of schizophrenia admitted for another psychotic break in the context of treatment noncompliance.The patient is still floridly psychotic andbizarre. He appears to be compliant with medications but his progress has been very slow. The patient has not been able to participate in discharge planningor programming.[facility] was [facility] to [facility].   #Psychosis, not improving -continueClozapine 300 mg nightly, Clo level pending -discontinueDepakote, VPA level today 99, ammonia 57 -ANC today 2.2 -add Trilafon 4 mg BID for psychosis -Trazodone 150 mg nightly  #Metabolic syndrome prevention -Metformin 500 mg BID  #Tachycardia, likely from Clozapine, resolved with treatment -Metoprolol 12.5 BID  #Labs were obtainedrecently  #Social -patient would benefit from help of the guardian -patient, in my opinion, should be considered disabled  #Disposition -discharge to home with the mother -needs family meeting -[name] accepted the patient  Kristine Linea, MD 03/24/2018, 12:00 PM

## 2018-03-23 NOTE — Plan of Care (Signed)
Patient is alert and oriented to self, place and time.He denies having thoughts of wanting to hurt himself or anyone else. Patient still verbalizes still hearing voices states, "I can't really say ma'am but it is difficult for me to trust certain people here." Patient took medications without much prompting today. Patient is improving very slowly. He no longer walks around covering his eyes with his hands or looks away but is still obeying the voices commanding him no eye contact.  Patient seen in the milieu periodically, he only comes out for meals and medications,although today he did not come out for lunch. Patient is minimally verbal but polite. Patient remains isolative to his room throughout the shift.  Patient is visibly responding to internal stimuli and appears anxious/frightened. Will continue to monitor.

## 2018-03-23 NOTE — Tx Team (Signed)
Interdisciplinary Treatment and Diagnostic Plan Update  03/23/2018 Time of Session: 10:30am [name] [name] MRN: [id number]  Principal Diagnosis: Schizoaffective disorder, bipolar type (HCC)  Secondary Diagnoses: Principal Problem:   Schizoaffective disorder, bipolar type (HCC) Active Problems:   Noncompliance   Hypertension   Current Medications:  Current Facility-Administered Medications  Medication Dose Route Frequency Provider Last Rate Last Dose  . acetaminophen (TYLENOL) tablet 650 mg  650 mg Oral Q6H PRN [name], [name], MD      . alum & mag hydroxide-simeth (MAALOX/MYLANTA) 200-200-20 MG/5ML suspension 30 mL  30 mL Oral Q4H PRN [name], [name], MD      . cloZAPine (CLOZARIL) tablet 300 mg  300 mg Oral QHS [name], [name], MD   300 mg at 03/22/18 2124  . divalproex (DEPAKOTE) DR tablet 500 mg  500 mg Oral Q12H [name], [name], MD   500 mg at 03/23/18 4098  . magnesium hydroxide (MILK OF MAGNESIA) suspension 30 mL  30 mL Oral Daily PRN [name], [name], MD   30 mL at 03/17/18 0530  . metFORMIN (GLUCOPHAGE) tablet 500 mg  500 mg Oral Q breakfast [name], [name], MD   500 mg at 03/23/18 1191  . metoprolol tartrate (LOPRESSOR) tablet 12.5 mg  12.5 mg Oral BID [name], [name], MD   12.5 mg at 03/23/18 0828  . OLANZapine (ZYPREXA) injection 10 mg  10 mg Intramuscular BID PRN [name], [name], MD      . traZODone (DESYREL) tablet 100 mg  100 mg Oral QHS [name], [name], MD   100 mg at 03/22/18 2124   PTA Medications: Medications Prior to Admission  Medication Sig Dispense Refill Last Dose  . cloZAPine (CLOZARIL) 100 MG tablet Take 3 tablets (300 mg total) by mouth at bedtime. 90 tablet 1` Unknown at Unknown  . divalproex (DEPAKOTE) 500 MG DR tablet Take 1 tablet (500 mg total) by mouth every 12 (twelve) hours. 60 tablet 1 Unknown at Unknown  . hydrOXYzine (ATARAX/VISTARIL) 50 MG tablet Take 1 tablet (50 mg total) by mouth 3 (three) times daily as needed for anxiety. 90 tablet 1  Unknown at Unknown  . metFORMIN (GLUCOPHAGE) 500 MG tablet Take 1 tablet (500 mg total) by mouth daily with breakfast. 30 tablet 1 Unknown at Unknown  . metoprolol tartrate (LOPRESSOR) 25 MG tablet Take 0.5 tablets (12.5 mg total) by mouth 2 (two) times daily. 60 tablet 1 Unknown at Unknown  . traZODone (DESYREL) 100 MG tablet Take 1 tablet (100 mg total) by mouth at bedtime as needed for sleep. 30 tablet 1 Unknown at Unknown    Patient Stressors: Financial difficulties Health problems  Patient Strengths: Average or above average intelligence Supportive family/friends  Treatment Modalities: Medication Management, Group therapy, Case management,  1 to 1 session with clinician, Psychoeducation, Recreational therapy.   Physician Treatment Plan for Primary Diagnosis: Schizoaffective disorder, bipolar type (HCC) Long Term Goal(s): Improvement in symptoms so as ready for discharge Improvement in symptoms so as ready for discharge   Short Term Goals: Ability to identify changes in lifestyle to reduce recurrence of condition will improve Ability to verbalize feelings will improve Ability to disclose and discuss suicidal ideas Ability to demonstrate self-control will improve Ability to identify and develop effective coping behaviors will improve Ability to maintain clinical measurements within normal limits will improve Compliance with prescribed medications will improve Ability to identify triggers associated with substance abuse/mental health issues will improve Ability to identify changes in lifestyle to reduce  recurrence of condition will improve Ability to verbalize feelings will improve Ability to disclose and discuss suicidal ideas Ability to demonstrate self-control will improve Ability to identify and develop effective coping behaviors will improve Ability to maintain clinical measurements within normal limits will improve Compliance with prescribed medications will improve Ability  to identify triggers associated with substance abuse/mental health issues will improve  Medication Management: Evaluate patient's response, side effects, and tolerance of medication regimen.  Therapeutic Interventions: 1 to 1 sessions, Unit Group sessions and Medication administration.  Evaluation of Outcomes: Progressing  Physician Treatment Plan for Secondary Diagnosis: Principal Problem:   Schizoaffective disorder, bipolar type (HCC) Active Problems:   Noncompliance   Hypertension  Long Term Goal(s): Improvement in symptoms so as ready for discharge Improvement in symptoms so as ready for discharge   Short Term Goals: Ability to identify changes in lifestyle to reduce recurrence of condition will improve Ability to verbalize feelings will improve Ability to disclose and discuss suicidal ideas Ability to demonstrate self-control will improve Ability to identify and develop effective coping behaviors will improve Ability to maintain clinical measurements within normal limits will improve Compliance with prescribed medications will improve Ability to identify triggers associated with substance abuse/mental health issues will improve Ability to identify changes in lifestyle to reduce recurrence of condition will improve Ability to verbalize feelings will improve Ability to disclose and discuss suicidal ideas Ability to demonstrate self-control will improve Ability to identify and develop effective coping behaviors will improve Ability to maintain clinical measurements within normal limits will improve Compliance with prescribed medications will improve Ability to identify triggers associated with substance abuse/mental health issues will improve     Medication Management: Evaluate patient's response, side effects, and tolerance of medication regimen.  Therapeutic Interventions: 1 to 1 sessions, Unit Group sessions and Medication administration.  Evaluation of Outcomes:  Progressing   RN Treatment Plan for Primary Diagnosis: Schizoaffective disorder, bipolar type (HCC) Long Term Goal(s): Knowledge of disease and therapeutic regimen to maintain health will improve  Short Term Goals: Ability to remain free from injury will improve, Ability to demonstrate self-control, Ability to identify and develop effective coping behaviors will improve and Compliance with prescribed medications will improve  Medication Management: RN will administer medications as ordered by provider, will assess and evaluate patient's response and provide education to patient for prescribed medication. RN will report any adverse and/or side effects to prescribing provider.  Therapeutic Interventions: 1 on 1 counseling sessions, Psychoeducation, Medication administration, Evaluate responses to treatment, Monitor vital signs and CBGs as ordered, Perform/monitor CIWA, COWS, AIMS and Fall Risk screenings as ordered, Perform wound care treatments as ordered.  Evaluation of Outcomes: Progressing   LCSW Treatment Plan for Primary Diagnosis: Schizoaffective disorder, bipolar type (HCC) Long Term Goal(s): Safe transition to appropriate next level of care at discharge, Engage patient in therapeutic group addressing interpersonal concerns.  Short Term Goals: Engage patient in aftercare planning with referrals and resources, Increase social support, Identify triggers associated with mental health/substance abuse issues and Increase skills for wellness and recovery  Therapeutic Interventions: Assess for all discharge needs, 1 to 1 time with Social worker, Explore available resources and support systems, Assess for adequacy in community support network, Educate family and significant other(s) on suicide prevention, Complete Psychosocial Assessment, Interpersonal group therapy.  Evaluation of Outcomes: Not Progressing   Progress in Treatment: Attending groups: No. Participating in groups: No. Taking  medication as prescribed: Yes. Toleration medication: Yes. Family/Significant other contact made: No, will contact:  Patient  refused Patient understands diagnosis: Yes. Discussing patient identified problems/goals with staff: Yes. Medical problems stabilized or resolved: Yes. Denies suicidal/homicidal ideation: Yes. Issues/concerns per patient self-inventory: No. Other:   New problem(s) identified: No, Describe:  None  New Short Term/Long Term Goal(s): "To get back on medications and get better."  Patient Goals:  "To get back on medications and get better."  Discharge Plan or Barriers: To return back to his mothers house and follow up with outpatient treatment.  Reason for Continuation of Hospitalization: Medication stabilization  Estimated Length of Stay: 7 days    Attendees: Patient:  03/23/2018 10:51 AM  Physician: Dr. Cecil Cranker, MD 03/23/2018 10:51 AM  Nursing: 03/23/2018 10:51 AM  RN Care Manager: 03/23/2018 10:51 AM  Social Worker: [name], LCSWA 03/23/2018 10:51 AM  Recreational Therapist: Danella Deis. Dreama Saa, LRT 03/23/2018 10:51 AM  Other: Jake Shark, LCSW 03/23/2018 10:51 AM  Other:  03/23/2018 10:51 AM  Other: 03/23/2018 10:51 AM    Scribe for Treatment Team: [name], LCSW 03/23/2018 10:51 AM

## 2018-03-23 NOTE — Plan of Care (Addendum)
Patient found sitting in a chair in his room staring at the wall. Patient is neither visible nor social this evening. Patient avoids eye contact completely and will not face you when speaking. Patient is minimally verbal but polite. Motor activity is slow but compliant. Patient remains isolative to his room throughout the evening. Denies SI/HI. Patient is visibly responding to internal stimuli and appears anxious/frightened. Patient requests and is provided a snack but unable to leave room due to anxiety. Compliant with HS medication with prompting. States, "I think I am going to decline this medication." Q 15 minute checks maintained. Will continue to monitor throughout the shift. Patient slept 7.5 hours. No apparent distress. Patient refused to come to day room for am VS. Performed at bedside. VSS.  Will endorse care to oncoming shift.  Problem: Nutrition: Goal: Adequate nutrition will be maintained Outcome: Progressing   Problem: Education: Goal: Ability to state activities that reduce stress will improve Outcome: Not Progressing   Problem: Coping: Goal: Ability to identify and develop effective coping behavior will improve Outcome: Not Progressing   Problem: Self-Concept: Goal: Ability to identify factors that promote anxiety will improve Outcome: Not Progressing Goal: Level of anxiety will decrease Outcome: Not Progressing   Problem: Education: Goal: Knowledge of General Education information will improve Description Including pain rating scale, medication(s)/side effects and non-pharmacologic comfort measures Outcome: Not Progressing   Problem: Coping: Goal: Level of anxiety will decrease Outcome: Not Progressing

## 2018-03-24 LAB — CBC WITH DIFFERENTIAL/PLATELET
BASOS ABS: 0 10*3/uL (ref 0–0.1)
BASOS PCT: 0 %
Eosinophils Absolute: 0 10*3/uL (ref 0–0.7)
Eosinophils Relative: 0 %
HEMATOCRIT: 47.9 % (ref 40.0–52.0)
Hemoglobin: 16.6 g/dL (ref 13.0–18.0)
Lymphocytes Relative: 43 %
Lymphs Abs: 2 10*3/uL (ref 1.0–3.6)
MCH: 28.3 pg (ref 26.0–34.0)
MCHC: 34.7 g/dL (ref 32.0–36.0)
MCV: 81.5 fL (ref 80.0–100.0)
Monocytes Absolute: 0.5 10*3/uL (ref 0.2–1.0)
Monocytes Relative: 10 %
NEUTROS ABS: 2.2 10*3/uL (ref 1.4–6.5)
NEUTROS PCT: 47 %
Platelets: 226 10*3/uL (ref 150–440)
RBC: 5.88 MIL/uL (ref 4.40–5.90)
RDW: 12.9 % (ref 11.5–14.5)
WBC: 4.7 10*3/uL (ref 3.8–10.6)

## 2018-03-24 LAB — CLOZAPINE (CLOZARIL)
Clozapine Lvl: 149 ng/mL — ABNORMAL LOW (ref 350–650)
NorClozapine: 64 ng/mL
Total(Cloz+Norcloz): 213 ng/mL

## 2018-03-24 LAB — AMMONIA: AMMONIA: 57 umol/L — AB (ref 9–35)

## 2018-03-24 LAB — VALPROIC ACID LEVEL: Valproic Acid Lvl: 99 ug/mL (ref 50.0–100.0)

## 2018-03-24 MED ORDER — FLUVOXAMINE MALEATE 50 MG PO TABS
50.0000 mg | ORAL_TABLET | Freq: Every day | ORAL | Status: DC
  Administered 2018-03-24 – 2018-04-02 (×10): 50 mg via ORAL
  Filled 2018-03-24 (×10): qty 1

## 2018-03-24 NOTE — Plan of Care (Signed)
Patient verbalized staff this morning that his goal for today is to attend groups but patient did not attend groups.Isolated in the room.Patient talked to staff with no eye contact.Denies SI,HI and AVH.Compliant with medications.Appetite and energy level good.Support and encouragement given.

## 2018-03-24 NOTE — [facility] Counselor (Signed)
Patient met with [facility] Lead Thayer Headings to complete an assessment for services. Thayer Headings doesn't think there will be an issues working with his once he is discharged and took down his mothers number to call her. CSW will arrange follow up with [facility] at discharge for the patient.  Darin Engels, MSW, Latanya Presser, [name] Clinical Social Worker 03/24/2018 2:03 PM

## 2018-03-24 NOTE — Progress Notes (Signed)
[facility] MD Progress Note  03/25/2018 11:19 AM Gregary MARLAN STEWARD  MRN:  [id number]  Subjective:    Mr. Aguilar is in bed with his head under the blanket but able to have a reasonable conversation through the cover. I have been trying to call his mother for several days with no success. The patient has not spoken to her either. He is unable to provide any additional phone numbers but thinks that Reuel Boom, a church friend, could contact her. We do not have Daniels's phone number. His mother gets out of work no sooner than 6:30 PM.  WE eventually obtained Clozapine level for this hospitalization. It is low, much lower that Clo level during previous admission with the same dose. I will add Luvox for augmentation instead of increasing Clozapine dose. We also started Trilafon for unrelenting psychosis.   Principal Problem: Schizoaffective disorder, bipolar type (HCC) Diagnosis:   Patient Active Problem List   Diagnosis Date Noted  . Schizoaffective disorder, bipolar type (HCC) [F25.0] 12/30/2017    Priority: High  . Hypertension [I10] 03/06/2018  . Noncompliance [Z91.19] 12/30/2017  . Aggressive behavior of adolescent [R46.89] 10/24/2015   Total Time spent with patient: 20 minutes  Past Psychiatric History: schizophrenia  Past Medical History:  Past Medical History:  Diagnosis Date  . Asthma   . Depression   . Psychosis [facility])     Past Surgical History:  Procedure Laterality Date  . BACK SURGERY     Family History: History reviewed. No pertinent family history. Family Psychiatric  History: none Social History:  Social History   Substance and Sexual Activity  Alcohol Use No     Social History   Substance and Sexual Activity  Drug Use Yes  . Types: Marijuana    Social History   Socioeconomic History  . Marital status: Single    Spouse name: Not on file  . Number of children: Not on file  . Years of education: Not on file  . Highest education level: Not on file  Occupational  History  . Not on file  Social Needs  . Financial resource strain: Not on file  . Food insecurity:    Worry: Not on file    Inability: Not on file  . Transportation needs:    Medical: Not on file    Non-medical: Not on file  Tobacco Use  . Smoking status: Never Smoker  . Smokeless tobacco: Never Used  Substance and Sexual Activity  . Alcohol use: No  . Drug use: Yes    Types: Marijuana  . Sexual activity: Not Currently  Lifestyle  . Physical activity:    Days per week: Not on file    Minutes per session: Not on file  . Stress: Not on file  Relationships  . Social connections:    Talks on phone: Not on file    Gets together: Not on file    Attends religious service: Not on file    Active member of club or organization: Not on file    Attends meetings of clubs or organizations: Not on file    Relationship status: Not on file  Other Topics Concern  . Not on file  Social History Narrative  . Not on file   Additional Social History:                         Sleep: Fair  Appetite:  Fair  Current Medications: Current Facility-Administered Medications  Medication Dose Route Frequency  Provider Last Rate Last Dose  . acetaminophen (TYLENOL) tablet 650 mg  650 mg Oral Q6H PRN [name], [name], MD      . alum & mag hydroxide-simeth (MAALOX/MYLANTA) 200-200-20 MG/5ML suspension 30 mL  30 mL Oral Q4H PRN [name], [name], MD      . cloZAPine (CLOZARIL) tablet 300 mg  300 mg Oral QHS [name], [name], MD   300 mg at 03/24/18 2042  . fluvoxaMINE (LUVOX) tablet 50 mg  50 mg Oral QHS Roxann Vierra B, MD   50 mg at 03/24/18 2042  . magnesium hydroxide (MILK OF MAGNESIA) suspension 30 mL  30 mL Oral Daily PRN [name], [name], MD   30 mL at 03/17/18 0530  . metFORMIN (GLUCOPHAGE) tablet 500 mg  500 mg Oral Q breakfast [name], [name], MD   500 mg at 03/25/18 0919  . metoprolol tartrate (LOPRESSOR) tablet 12.5 mg  12.5 mg Oral BID [name], [name], MD   12.5 mg at 03/25/18  0920  . perphenazine (TRILAFON) tablet 4 mg  4 mg Oral BID Mohan Erven B, MD   4 mg at 03/25/18 0919  . traZODone (DESYREL) tablet 100 mg  100 mg Oral QHS [name], [name], MD   100 mg at 03/24/18 2042    Lab Results:  Results for orders placed or performed during the hospital encounter of 03/07/18 (from the past 48 hour(s))  Valproic acid level     Status: None   Collection Time: 03/24/18  7:33 AM  Result Value Ref Range   Valproic Acid Lvl 99 50.0 - 100.0 ug/mL    Comment: Performed at [facility], [address] [place], [place] [address]  Ammonia     Status: Abnormal   Collection Time: 03/24/18  7:33 AM  Result Value Ref Range   Ammonia 57 (H) 9 - 35 umol/L    Comment: Performed at [facility], [address] [place], [place] [address]  CBC with Differential/Platelet     Status: None   Collection Time: 03/24/18  7:33 AM  Result Value Ref Range   WBC 4.7 3.8 - 10.6 K/uL   RBC 5.88 4.40 - 5.90 MIL/uL   Hemoglobin 16.6 13.0 - 18.0 g/dL   HCT 95.0 93.2 - 67.1 %   MCV 81.5 80.0 - 100.0 fL   MCH 28.3 26.0 - 34.0 pg   MCHC 34.7 32.0 - 36.0 g/dL   RDW 24.5 80.9 - 98.3 %   Platelets 226 150 - 440 K/uL   Neutrophils Relative % 47 %   Neutro Abs 2.2 1.4 - 6.5 K/uL   Lymphocytes Relative 43 %   Lymphs Abs 2.0 1.0 - 3.6 K/uL   Monocytes Relative 10 %   Monocytes Absolute 0.5 0.2 - 1.0 K/uL   Eosinophils Relative 0 %   Eosinophils Absolute 0.0 0 - 0.7 K/uL   Basophils Relative 0 %   Basophils Absolute 0.0 0 - 0.1 K/uL    Comment: Performed at [facility], [address] [place], [place] [address]    Blood Alcohol level:  Lab Results  Component Value Date   [facility] <10 03/06/2018   ETH <10 12/30/2017    Metabolic Disorder Labs: Lab Results  Component Value Date   HGBA1C 4.9 03/08/2018   MPG 93.93 03/08/2018   MPG 85.32 12/30/2017   No results found for: PROLACTIN Lab Results  Component Value Date   CHOL 238 (H) 03/08/2018   TRIG  69 03/08/2018  HDL 56 03/08/2018   CHOLHDL 4.3 03/08/2018   VLDL 14 03/08/2018   LDLCALC 168 (H) 03/08/2018   LDLCALC 97 12/30/2017    Physical Findings: AIMS: Facial and Oral Movements Muscles of Facial Expression: None, normal Lips and Perioral Area: None, normal Jaw: None, normal Tongue: None, normal,Extremity Movements Upper (arms, wrists, hands, fingers): None, normal Lower (legs, knees, ankles, toes): None, normal, Trunk Movements Neck, shoulders, hips: None, normal, Overall Severity Severity of abnormal movements (highest score from questions above): None, normal Incapacitation due to abnormal movements: None, normal Patient's awareness of abnormal movements (rate only patient's report): No Awareness, Dental Status Current problems with teeth and/or dentures?: No Does patient usually wear dentures?: No  CIWA:    COWS:     Musculoskeletal: Strength & Muscle Tone: within normal limits Gait & Station: normal Patient leans: N/A  Psychiatric Specialty Exam: Physical Exam  Nursing note and vitals reviewed. Psychiatric: His affect is blunt. His speech is delayed. He is slowed, withdrawn and actively hallucinating. Thought content is paranoid and delusional. Cognition and memory are impaired. He expresses impulsivity.    Review of Systems  Neurological: Negative.   Psychiatric/Behavioral: Positive for hallucinations.  All other systems reviewed and are negative.   Blood pressure 128/85, pulse (!) 109, temperature 98.4 F (36.9 C), resp. rate 18, height 6' 0.44" (1.84 m), weight 86.2 kg, SpO2 100 %.Body mass index is 25.46 kg/m.  General Appearance: Casual  Eye Contact:  Absent  Speech:  Slow  Volume:  Decreased  Mood:  Euthymic  Affect:  Blunt  Thought Process:  Disorganized, Irrelevant and Descriptions of Associations: Tangential  Orientation:  Full (Time, Place, and Person)  Thought Content:  Delusions, Hallucinations: Auditory and Paranoid Ideation  Suicidal  Thoughts:  No  Homicidal Thoughts:  No  Memory:  Immediate;   Poor Recent;   Poor Remote;   Poor  Judgement:  Poor  Insight:  Lacking  Psychomotor Activity:  Psychomotor Retardation  Concentration:  Concentration: Poor and Attention Span: Poor  Recall:  Poor  Fund of Knowledge:  Poor  Language:  Poor  Akathisia:  No  Handed:  Right  AIMS (if indicated):     Assets:  Communication Skills Desire for Improvement Financial Resources/Insurance Housing Physical Health Resilience Social Support  ADL's:  Intact  Cognition:  WNL  Sleep:  Number of Hours: 7.25     Treatment Plan Summary: Daily contact with patient to assess and evaluate symptoms and progress in treatment and Medication management   Mr. Cardinal is a 22 year old male with a history of schizophrenia admitted for another psychotic break in the context of treatment noncompliance.The patient is still floridly psychotic andbizarre. He appears to be compliant with medications but his progress has been very slow. The patient has not been able to participate in discharge planningor programming.He was referred to [facility].   #Psychosis, not improving -continueClozapine 300 mg nightly, Clo level 213 only on 8/30 -add Luvox 50 mg nightly for augmentation -discontinueDepakote, VPA level today 99, ammonia 57 -ANC today 2.2 -add Trilafon 4 mg BID for psychosis, increase tomorrow -Trazodone 150 mg nightly  #Metabolic syndrome prevention -Metformin 500 mg BID  #Tachycardia, likely from Clozapine, resolved with treatment -Metoprolol 12.5 BID  #Labs were obtainedrecently  #Social -patient would benefit from help of the guardian -patient, in my opinion, should be considered disabled  #Disposition -discharge to home with the mother -needs family meeting -[name] team accepted the patient  Kristine Linea, MD 03/25/2018, 11:19  AM

## 2018-03-24 NOTE — [facility] Group Notes (Signed)

## 2018-03-24 NOTE — [facility] Group Notes (Signed)
[facility] Group Notes:  (Nursing/MHT/Case Management/Adjunct)  Date:  03/24/2018  Time:  9:37 PM  Type of Therapy:  Group Therapy  Participation Level:  Did Not Attend   [name] 03/24/2018, 9:37 PM

## 2018-03-24 NOTE — Progress Notes (Signed)
Recreation Therapy Notes  Date: 03/24/2018  Time: 9:30 am   Location: Outside   Behavioral response: N/A   Intervention Topic: Problem Solving  Discussion/Intervention: Patient did not attend group.   Clinical Observations/Feedback:  Patient did not attend group.   Arienna Benegas LRT/CTRS        Crue Otero 03/24/2018 11:07 AM

## 2018-03-25 LAB — CLOZAPINE (CLOZARIL)
Clozapine Lvl: 194 ng/mL — ABNORMAL LOW (ref 350–650)
NorClozapine: 69 ng/mL
Total(Cloz+Norcloz): 263 ng/mL

## 2018-03-25 NOTE — Plan of Care (Addendum)
Patient found awake in bed upon my arrival. Patient is neither visible nor social this evening. Remains isolative to room entire evening. Patient continues to avoid eye contact and showing his face. Continues to respond to internal stimuli and report anxiety regarding AVH. Denies SI/HI. Denies pain. Reports eating adequately during the day but refused snack @2100 . Compliant with HS medications. Did not attend group. Q 15 minute checks maintained. Will continue to monitor throughout the shift.  Patient slept 7.25 hours. No apparent distress. Will endorse care to oncoming shift.  Problem: Education: Goal: Knowledge of General Education information will improve Description Including pain rating scale, medication(s)/side effects and non-pharmacologic comfort measures Outcome: Progressing   Problem: Nutrition: Goal: Adequate nutrition will be maintained Outcome: Progressing   Problem: Education: Goal: Ability to state activities that reduce stress will improve Outcome: Not Progressing   Problem: Coping: Goal: Ability to identify and develop effective coping behavior will improve Outcome: Not Progressing   Problem: Self-Concept: Goal: Ability to identify factors that promote anxiety will improve Outcome: Not Progressing Goal: Level of anxiety will decrease Outcome: Not Progressing Goal: Ability to modify response to factors that promote anxiety will improve Outcome: Not Progressing   Problem: Coping: Goal: Level of anxiety will decrease Outcome: Not Progressing

## 2018-03-25 NOTE — Progress Notes (Signed)
Recreation Therapy Notes  Date: 03/25/2018  Time: 9:30 am   Location: Craft room   Behavioral response: N/A   Intervention Topic: Team Work  Discussion/Intervention: Patient did not attend group.   Clinical Observations/Feedback:  Patient did not attend group.   Latrish Mogel LRT/CTRS        Turner Baillie 03/25/2018 12:12 PM 

## 2018-03-25 NOTE — Plan of Care (Signed)
Patient is alert; denies SI and HI. Patient is actively hearing voices telling him to cover face and not to look at others. Patient isolates to his room; refuses group sessions; dresses in scrubs; minimal contact with staff and peers. Patient rates pain 0/0; Patient has no complaints. Nurse will continue to monitor. Problem: Education: Goal: Ability to state activities that reduce stress will improve Outcome: Not Progressing   Problem: Self-Concept: Goal: Ability to identify factors that promote anxiety will improve Outcome: Not Progressing Goal: Level of anxiety will decrease Outcome: Not Progressing Goal: Ability to modify response to factors that promote anxiety will improve Outcome: Not Progressing   Problem: Education: Goal: Knowledge of General Education information will improve Description Including pain rating scale, medication(s)/side effects and non-pharmacologic comfort measures Outcome: Not Progressing

## 2018-03-25 NOTE — [facility] Group Notes (Signed)
LCSW Group Therapy Note  03/25/2018 1:00 pm  Type of Therapy/Topic:  Group Therapy:  Emotion Regulation  Participation Level:  Did Not Attend   Description of Group:    The purpose of this group is to assist patients in learning to regulate negative emotions and experience positive emotions. Patients will be guided to discuss ways in which they have been vulnerable to their negative emotions. These vulnerabilities will be juxtaposed with experiences of positive emotions or situations, and patients will be challenged to use positive emotions to combat negative ones. Special emphasis will be placed on coping with negative emotions in conflict situations, and patients will process healthy conflict resolution skills.  Therapeutic Goals: 1. Patient will identify two positive emotions or experiences to reflect on in order to balance out negative emotions 2. Patient will label two or more emotions that they find the most difficult to experience 3. Patient will demonstrate positive conflict resolution skills through discussion and/or role plays  Summary of Patient Progress:  [name] [name] was invited to today's group, but chose not to attend.     Therapeutic Modalities:   Cognitive Behavioral Therapy Feelings Identification Dialectical Behavioral Therapy

## 2018-03-26 MED ORDER — PERPHENAZINE 4 MG PO TABS
8.0000 mg | ORAL_TABLET | Freq: Two times a day (BID) | ORAL | Status: DC
  Administered 2018-03-26 – 2018-04-03 (×16): 8 mg via ORAL
  Filled 2018-03-26 (×16): qty 2

## 2018-03-26 NOTE — Plan of Care (Signed)
Cooperative but continues to display bizarre behavior, isolative in room.

## 2018-03-26 NOTE — Plan of Care (Signed)
Continues to display bizarre behavior. Isolative in room

## 2018-03-26 NOTE — [facility] Group Notes (Signed)
LCSW Group Therapy Note  03/26/2018 1:15pm  Type of Therapy/Topic:  Group Therapy:  Balance in Life  Participation Level:  Did Not Attend  Description of Group:    This group will address the concept of balance and how it feels and looks when one is unbalanced. Patients will be encouraged to process areas in their lives that are out of balance and identify reasons for remaining unbalanced. Facilitators will guide patients in utilizing problem-solving interventions to address and correct the stressor making their life unbalanced. Understanding and applying boundaries will be explored and addressed for obtaining and maintaining a balanced life. Patients will be encouraged to explore ways to assertively make their unbalanced needs known to significant others in their lives, using other group members and facilitator for support and feedback.  Therapeutic Goals: 1. Patient will identify two or more emotions or situations they have that consume much of in their lives. 2. Patient will identify signs/triggers that life has become out of balance:  3. Patient will identify two ways to set boundaries in order to achieve balance in their lives:  4. Patient will demonstrate ability to communicate their needs through discussion and/or role plays  Summary of Patient Progress:      Therapeutic Modalities:   Cognitive Behavioral Therapy Solution-Focused Therapy Assertiveness Training  Alease Frame, LCSW 03/26/2018 2:59 PM

## 2018-03-26 NOTE — Progress Notes (Signed)
Patient continues to display bizarre behavior. Isolative in room and avoiding eye contact, hiding his face and saying "I am not allowed to look at people's face". Did not program in evening activities. Did not eat snack. Medications taken to him in room. Guarded and avoiding long conversations but pleasant on approach. Was encouraged to call staff as needed. Safety precautions reinforced.

## 2018-03-26 NOTE — Plan of Care (Signed)
Patient is still extremely paranoid and obeying voices telling him to stay in his room and have no eye contact.  Patient would not look at this writer when communicating with him. Affect flat, denies SI/HI and pain. Isolative to his room and doses not interact with his peers when eating his meals. Compliant with his medications. Milieu remains safe at this time. Will continue to monitor.

## 2018-03-26 NOTE — Progress Notes (Signed)
[facility] MD Progress Note  03/26/2018 10:08 AM [name] [name]  MRN:  [id number]  Subjective:   [name] [name] is still extremely paranoid and obeying his voices telling him to stay in his room and have no eye contact. This morning again, we have small conversation but his head is covered with a blanket and he would not move at all. He denies any symptoms or side effects from medications. He is "fine, miss". He was unable to recall phone number for Reuel Boom, a church friend but remembered another number for his mother [phone].  As every day, I called all the numbers listed in his chart with no luck. I worry that ACT team will not be able to contact her either. The mother was also provided with information and forms necessary for SSDI and guardianship applications. I have not heard from her since.   Principal Problem: Schizoaffective disorder, bipolar type (HCC) Diagnosis:   Patient Active Problem List   Diagnosis Date Noted  . Schizoaffective disorder, bipolar type (HCC) [F25.0] 12/30/2017    Priority: High  . Hypertension [I10] 03/06/2018  . Noncompliance [Z91.19] 12/30/2017  . Aggressive behavior of adolescent [R46.89] 10/24/2015   Total Time spent with patient: 20 minutes  Past Psychiatric History: schizophrenia  Past Medical History:  Past Medical History:  Diagnosis Date  . Asthma   . Depression   . Psychosis [facility])     Past Surgical History:  Procedure Laterality Date  . BACK SURGERY     Family History: History reviewed. No pertinent family history. Family Psychiatric  History: none Social History:  Social History   Substance and Sexual Activity  Alcohol Use No     Social History   Substance and Sexual Activity  Drug Use Yes  . Types: Marijuana    Social History   Socioeconomic History  . Marital status: Single    Spouse name: Not on file  . Number of children: Not on file  . Years of education: Not on file  . Highest education level: Not on file  Occupational  History  . Not on file  Social Needs  . Financial resource strain: Not on file  . Food insecurity:    Worry: Not on file    Inability: Not on file  . Transportation needs:    Medical: Not on file    Non-medical: Not on file  Tobacco Use  . Smoking status: Never Smoker  . Smokeless tobacco: Never Used  Substance and Sexual Activity  . Alcohol use: No  . Drug use: Yes    Types: Marijuana  . Sexual activity: Not Currently  Lifestyle  . Physical activity:    Days per week: Not on file    Minutes per session: Not on file  . Stress: Not on file  Relationships  . Social connections:    Talks on phone: Not on file    Gets together: Not on file    Attends religious service: Not on file    Active member of club or organization: Not on file    Attends meetings of clubs or organizations: Not on file    Relationship status: Not on file  Other Topics Concern  . Not on file  Social History Narrative  . Not on file   Additional Social History:                         Sleep: Fair  Appetite:  Fair  Current Medications: Current Facility-Administered Medications  Medication Dose Route Frequency Provider Last Rate Last Dose  . acetaminophen (TYLENOL) tablet 650 mg  650 mg Oral Q6H PRN [name], [name], MD      . alum & mag hydroxide-simeth (MAALOX/MYLANTA) 200-200-20 MG/5ML suspension 30 mL  30 mL Oral Q4H PRN [name], [name], MD      . cloZAPine (CLOZARIL) tablet 300 mg  300 mg Oral QHS [name], [name], MD   300 mg at 03/25/18 2201  . fluvoxaMINE (LUVOX) tablet 50 mg  50 mg Oral QHS Marirose Deveney B, MD   50 mg at 03/25/18 2201  . magnesium hydroxide (MILK OF MAGNESIA) suspension 30 mL  30 mL Oral Daily PRN [name], [name], MD   30 mL at 03/17/18 0530  . metFORMIN (GLUCOPHAGE) tablet 500 mg  500 mg Oral Q breakfast [name], [name], MD   500 mg at 03/26/18 0800  . metoprolol tartrate (LOPRESSOR) tablet 12.5 mg  12.5 mg Oral BID [name], [name], MD   12.5 mg at 03/26/18  0800  . perphenazine (TRILAFON) tablet 4 mg  4 mg Oral BID Eboney Claybrook B, MD   4 mg at 03/26/18 0801  . traZODone (DESYREL) tablet 100 mg  100 mg Oral QHS [name], [name], MD   100 mg at 03/25/18 2201    Lab Results:  No results found for this or any previous visit (from the past 48 hour(s)).  Blood Alcohol level:  Lab Results  Component Value Date   ETH <10 03/06/2018   ETH <10 12/30/2017    Metabolic Disorder Labs: Lab Results  Component Value Date   HGBA1C 4.9 03/08/2018   MPG 93.93 03/08/2018   MPG 85.32 12/30/2017   No results found for: PROLACTIN Lab Results  Component Value Date   CHOL 238 (H) 03/08/2018   TRIG 69 03/08/2018   HDL 56 03/08/2018   CHOLHDL 4.3 03/08/2018   VLDL 14 03/08/2018   LDLCALC 168 (H) 03/08/2018   LDLCALC 97 12/30/2017    Physical Findings: AIMS: Facial and Oral Movements Muscles of Facial Expression: None, normal Lips and Perioral Area: None, normal Jaw: None, normal Tongue: None, normal,Extremity Movements Upper (arms, wrists, hands, fingers): None, normal Lower (legs, knees, ankles, toes): None, normal, Trunk Movements Neck, shoulders, hips: None, normal, Overall Severity Severity of abnormal movements (highest score from questions above): None, normal Incapacitation due to abnormal movements: None, normal Patient's awareness of abnormal movements (rate only patient's report): No Awareness, Dental Status Current problems with teeth and/or dentures?: No Does patient usually wear dentures?: No  CIWA:    COWS:     Musculoskeletal: Strength & Muscle Tone: within normal limits Gait & Station: normal Patient leans: N/A  Psychiatric Specialty Exam: Physical Exam  Nursing note and vitals reviewed. Psychiatric: His affect is blunt. His speech is delayed and tangential. He is slowed, withdrawn and actively hallucinating. Thought content is paranoid and delusional. Cognition and memory are impaired. He expresses impulsivity.     Review of Systems  Neurological: Negative.   Psychiatric/Behavioral: Positive for hallucinations.  All other systems reviewed and are negative.   Blood pressure 126/61, pulse 85, temperature 97.6 F (36.4 C), temperature source Oral, resp. rate 16, height 6' 0.44" (1.84 m), weight 86.2 kg, SpO2 99 %.Body mass index is 25.46 kg/m.  General Appearance: Disheveled  Eye Contact:  None  Speech:  Blocked  Volume:  Decreased  Mood:  Euthymic  Affect:  Blunt  Thought Process:  Linear  Orientation:  Full (Time,  Place, and Person)  Thought Content:  Delusions, Hallucinations: Auditory and Paranoid Ideation  Suicidal Thoughts:  No  Homicidal Thoughts:  No  Memory:  Immediate;   Poor Recent;   Poor Remote;   Poor  Judgement:  Poor  Insight:  Lacking  Psychomotor Activity:  Psychomotor Retardation  Concentration:  Concentration: Poor and Attention Span: Poor  Recall:  Poor  Fund of Knowledge:  Poor  Language:  Poor  Akathisia:  No  Handed:  Right  AIMS (if indicated):     Assets:  Communication Skills Desire for Improvement Financial Resources/Insurance Housing Physical Health Resilience Social Support  ADL's:  Intact  Cognition:  WNL  Sleep:  Number of Hours: 7.15     Treatment Plan Summary: Daily contact with patient to assess and evaluate symptoms and progress in treatment and Medication management   Mr. Dancel is a 22 year old male with a history of schizophrenia admitted for another psychotic break in the context of treatment noncompliance.The patient is still floridly psychotic andbizarre. He appears to be compliant with medications but his progress has been very slow. The patient has not been able to participate in discharge planningor programming.He was referred to [facility].   #Psychosis, not improving -continueClozapine 300 mg nightly, Clo level 213 only on 8/30 -continue Luvox 50 mg nightly for augmentation -discontinueDepakote, VPA level today  99, ammonia 57 -last ANC 2.2 -increase Trilafon to 8 mg BID for psychosis -Trazodone 150 mg nightly  #Metabolic syndrome prevention -Metformin 500 mg BID  #Tachycardia, likely from Clozapine, resolved with treatment -Metoprolol 12.5 BID  #Labs were obtainedrecently  #Social -patient would benefit from help of the guardian -patient, in my opinion, should be considered disabled  #Disposition -discharge to home with the mother -needs family meeting -[facility] teamaccepted the patient  Kristine Linea, MD 03/26/2018, 10:08 AM

## 2018-03-26 NOTE — Progress Notes (Signed)
[facility] MD Progress Note  03/27/2018 4:13 PM [name] [name]  MRN:  [id number]  Subjective:    [name] [name] shows minimal improvement and is responding to internal stimuli (no eye contact, stay in your room}. We discontinue Depakote for elevated ammonia. He does no thave affective component but mostly psychosis. Strangely, Clo level is much lower this time than in July on the same dose. I augmented with Luvox. I also added Trilafon.  No change in [name]'s behavior. In bed with covers over his face. Uncovers the face when asked but keep eyes closed. No spontaneity. Slightly more conversational but forwards nothing. "I feel goo. I'm good to go." Promises to go to groups, dayroom, outside.  Unable to contact the mother who works long hours. Weekend SW to try again.  Principal Problem: Schizoaffective disorder, bipolar type (HCC) Diagnosis:   Patient Active Problem List   Diagnosis Date Noted  . Schizoaffective disorder, bipolar type (HCC) [F25.0] 12/30/2017    Priority: High  . Hypertension [I10] 03/06/2018  . Noncompliance [Z91.19] 12/30/2017  . Aggressive behavior of adolescent [R46.89] 10/24/2015   Total Time spent with patient: 20 minutes  Past Psychiatric History: schizophrenia  Past Medical History:  Past Medical History:  Diagnosis Date  . Asthma   . Depression   . Psychosis [facility])     Past Surgical History:  Procedure Laterality Date  . BACK SURGERY     Family History: History reviewed. No pertinent family history. Family Psychiatric  History: none Social History:  Social History   Substance and Sexual Activity  Alcohol Use No     Social History   Substance and Sexual Activity  Drug Use Yes  . Types: Marijuana    Social History   Socioeconomic History  . Marital status: Single    Spouse name: Not on file  . Number of children: Not on file  . Years of education: Not on file  . Highest education level: Not on file  Occupational History  . Not on file  Social  Needs  . Financial resource strain: Not on file  . Food insecurity:    Worry: Not on file    Inability: Not on file  . Transportation needs:    Medical: Not on file    Non-medical: Not on file  Tobacco Use  . Smoking status: Never Smoker  . Smokeless tobacco: Never Used  Substance and Sexual Activity  . Alcohol use: No  . Drug use: Yes    Types: Marijuana  . Sexual activity: Not Currently  Lifestyle  . Physical activity:    Days per week: Not on file    Minutes per session: Not on file  . Stress: Not on file  Relationships  . Social connections:    Talks on phone: Not on file    Gets together: Not on file    Attends religious service: Not on file    Active member of club or organization: Not on file    Attends meetings of clubs or organizations: Not on file    Relationship status: Not on file  Other Topics Concern  . Not on file  Social History Narrative  . Not on file   Additional Social History:                         Sleep: Fair  Appetite:  Fair  Current Medications: Current Facility-Administered Medications  Medication Dose Route Frequency Provider Last Rate Last Dose  . acetaminophen (  TYLENOL) tablet 650 mg  650 mg Oral Q6H PRN [name], [name], MD      . alum & mag hydroxide-simeth (MAALOX/MYLANTA) 200-200-20 MG/5ML suspension 30 mL  30 mL Oral Q4H PRN [name], [name], MD      . cloZAPine (CLOZARIL) tablet 300 mg  300 mg Oral QHS [name], [name], MD   300 mg at 03/26/18 2112  . fluvoxaMINE (LUVOX) tablet 50 mg  50 mg Oral QHS [name], [name], MD   50 mg at 03/26/18 2112  . magnesium hydroxide (MILK OF MAGNESIA) suspension 30 mL  30 mL Oral Daily PRN [name], [name], MD   30 mL at 03/17/18 0530  . metFORMIN (GLUCOPHAGE) tablet 500 mg  500 mg Oral Q breakfast [name], [name], MD   500 mg at 03/27/18 3748  . metoprolol tartrate (LOPRESSOR) tablet 12.5 mg  12.5 mg Oral BID [name], [name], MD   12.5 mg at 03/27/18 0833  . perphenazine (TRILAFON)  tablet 8 mg  8 mg Oral BID [name], [name], MD   8 mg at 03/27/18 0833  . traZODone (DESYREL) tablet 100 mg  100 mg Oral QHS [name], [name], MD   100 mg at 03/26/18 2112    Lab Results:  Results for orders placed or performed during the hospital encounter of 03/07/18 (from the past 48 hour(s))  CBC with Differential/Platelet     Status: None   Collection Time: 03/27/18  7:20 AM  Result Value Ref Range   WBC 5.8 3.8 - 10.6 K/uL   RBC 5.59 4.40 - 5.90 MIL/uL   Hemoglobin 16.0 13.0 - 18.0 g/dL   HCT 27.0 78.6 - 75.4 %   MCV 81.3 80.0 - 100.0 fL   MCH 28.6 26.0 - 34.0 pg   MCHC 35.2 32.0 - 36.0 g/dL   RDW 49.2 01.0 - 07.1 %   Platelets 216 150 - 440 K/uL   Neutrophils Relative % 48 %   Neutro Abs 2.7 1.4 - 6.5 K/uL   Lymphocytes Relative 41 %   Lymphs Abs 2.4 1.0 - 3.6 K/uL   Monocytes Relative 11 %   Monocytes Absolute 0.7 0.2 - 1.0 K/uL   Eosinophils Relative 0 %   Eosinophils Absolute 0.0 0 - 0.7 K/uL   Basophils Relative 0 %   Basophils Absolute 0.0 0 - 0.1 K/uL    Comment: Performed at [facility], [address] [place], [place] [address]    Blood Alcohol level:  Lab Results  Component Value Date   [facility] <10 03/06/2018   ETH <10 12/30/2017    Metabolic Disorder Labs: Lab Results  Component Value Date   HGBA1C 4.9 03/08/2018   MPG 93.93 03/08/2018   MPG 85.32 12/30/2017   No results found for: PROLACTIN Lab Results  Component Value Date   CHOL 238 (H) 03/08/2018   TRIG 69 03/08/2018   HDL 56 03/08/2018   CHOLHDL 4.3 03/08/2018   VLDL 14 03/08/2018   LDLCALC 168 (H) 03/08/2018   LDLCALC 97 12/30/2017    Physical Findings: AIMS: Facial and Oral Movements Muscles of Facial Expression: None, normal Lips and Perioral Area: None, normal Jaw: None, normal Tongue: None, normal,Extremity Movements Upper (arms, wrists, hands, fingers): None, normal Lower (legs, knees, ankles, toes): None, normal, Trunk Movements Neck, shoulders, hips: None,  normal, Overall Severity Severity of abnormal movements (highest score from questions above): None, normal Incapacitation due to abnormal movements: None, normal Patient's awareness of abnormal movements (rate only  patient's report): No Awareness, Dental Status Current problems with teeth and/or dentures?: No Does patient usually wear dentures?: No  CIWA:    COWS:     Musculoskeletal: Strength & Muscle Tone: within normal limits Gait & Station: normal Patient leans: N/A  Psychiatric Specialty Exam: Physical Exam  Nursing note and vitals reviewed. Psychiatric: His affect is blunt. His speech is delayed. He is withdrawn and actively hallucinating. Thought content is paranoid and delusional. Cognition and memory are impaired. He expresses impulsivity.    Review of Systems  Neurological: Negative.   Psychiatric/Behavioral: Positive for hallucinations.  All other systems reviewed and are negative.   Blood pressure 124/82, pulse 80, temperature 97.6 F (36.4 C), temperature source Oral, resp. rate 16, height 6' 0.44" (1.84 m), weight 86.2 kg, SpO2 99 %.Body mass index is 25.46 kg/m.  General Appearance: Casual  Eye Contact:  Absent  Speech:  Clear and Coherent  Volume:  Decreased  Mood:  Euthymic  Affect:  Blunt  Thought Process:  Irrelevant  Orientation:  Full (Time, Place, and Person)  Thought Content:  Delusions, Hallucinations: Auditory and Paranoid Ideation  Suicidal Thoughts:  No  Homicidal Thoughts:  No  Memory:  Immediate;   Poor Recent;   Poor Remote;   Poor  Judgement:  Poor  Insight:  Lacking  Psychomotor Activity:  Psychomotor Retardation  Concentration:  Concentration: Poor and Attention Span: Poor  Recall:  Poor  Fund of Knowledge:  Poor  Language:  Poor  Akathisia:  No  Handed:  Right  AIMS (if indicated):     Assets:  Desire for Improvement Financial Resources/Insurance Housing Physical Health Resilience Social Support  ADL's:  Intact  Cognition:   WNL  Sleep:  Number of Hours: 6.5     Treatment Plan Summary: Daily contact with patient to assess and evaluate symptoms and progress in treatment and Medication management   Mr. Tones is a 22 year old male with a history of schizophrenia admitted for another psychotic break in the context of treatment noncompliance.The patient is still floridly psychotic andbizarre. He appears to be compliant with medications but his progress has been very slow. The patient has not been able to participate in discharge planningor programming.He was referred to [facility].   #Psychosis, not improving -continueClozapine 300 mg nightly, Clo level213 only on 8/30 -continue Luvox 50 mg nightly for augmentation -discontinueDepakote, VPA level today 99, ammonia 57 -last ANC 2.2 -increase Trilafon to 8 mg BID for psychosis -Trazodone 150 mg nightly  #Metabolic syndrome prevention -Metformin 500 mg BID  #Tachycardia, likely from Clozapine, resolved with treatment -Metoprolol 12.5 BID  #Labs were obtainedrecently  #Social -patient would benefit from help of the guardian -patient, in my opinion, should be considered disabled  #Disposition -discharge to home with the mother -needs family meeting -[facility] teamaccepted the patient     Kristine Linea, MD 03/27/2018, 4:13 PM

## 2018-03-26 NOTE — Progress Notes (Signed)
Recreation Therapy Notes   Date: 03/26/2018  Time: 9:30 am   Location: Craft room   Behavioral response: N/A   Intervention Topic: Coping  Discussion/Intervention: Patient did not attend group.   Clinical Observations/Feedback:  Patient did not attend group.   Gauge Winski LRT/CTRS        Aarianna Hoadley 03/26/2018 10:47 AM

## 2018-03-26 NOTE — Progress Notes (Signed)
Arbutus Ped continues to display bizarre behaviors. Isolative in room, hiding his face,  guarded. Stayed in bed and did not participate in HS activities. Continues to report that he is not allowed to look at people's face. Was able to talk to this writer, reporting that he misses home, his mother and his little brother. Patient was pleasant and took his medications voluntarily. Had no other concerns. Currently in bed sleeping and staff continue to monitor for safety.

## 2018-03-27 LAB — CBC WITH DIFFERENTIAL/PLATELET
BASOS PCT: 0 %
Basophils Absolute: 0 10*3/uL (ref 0–0.1)
Eosinophils Absolute: 0 10*3/uL (ref 0–0.7)
Eosinophils Relative: 0 %
HEMATOCRIT: 45.4 % (ref 40.0–52.0)
HEMOGLOBIN: 16 g/dL (ref 13.0–18.0)
LYMPHS PCT: 41 %
Lymphs Abs: 2.4 10*3/uL (ref 1.0–3.6)
MCH: 28.6 pg (ref 26.0–34.0)
MCHC: 35.2 g/dL (ref 32.0–36.0)
MCV: 81.3 fL (ref 80.0–100.0)
MONOS PCT: 11 %
Monocytes Absolute: 0.7 10*3/uL (ref 0.2–1.0)
NEUTROS ABS: 2.7 10*3/uL (ref 1.4–6.5)
Neutrophils Relative %: 48 %
Platelets: 216 10*3/uL (ref 150–440)
RBC: 5.59 MIL/uL (ref 4.40–5.90)
RDW: 12.8 % (ref 11.5–14.5)
WBC: 5.8 10*3/uL (ref 3.8–10.6)

## 2018-03-27 NOTE — Progress Notes (Signed)
Recreation Therapy Notes  Date: 03/27/2018  Time: 9:30 pm   Location: Craft Room   Behavioral response: N/A   Intervention Topic: Happiness  Discussion/Intervention: Patient did not attend group.   Clinical Observations/Feedback:  Patient did not attend group.   Sartaj Hoskin LRT/CTRS        [name] [name] 03/27/2018 1:49 PM 

## 2018-03-27 NOTE — Tx Team (Signed)
Interdisciplinary Treatment and Diagnostic Plan Update  03/27/2018 Time of Session: 0842am [name] [name] MRN: [id number]  Principal Diagnosis: Schizoaffective disorder, bipolar type [facility])  Secondary Diagnoses: Principal Problem:   Schizoaffective disorder, bipolar type (HCC) Active Problems:   Noncompliance   Hypertension   Current Medications:  Current Facility-Administered Medications  Medication Dose Route Frequency Provider Last Rate Last Dose  . acetaminophen (TYLENOL) tablet 650 mg  650 mg Oral Q6H PRN [name], [name], MD      . alum & mag hydroxide-simeth (MAALOX/MYLANTA) 200-200-20 MG/5ML suspension 30 mL  30 mL Oral Q4H PRN [name], [name], MD      . cloZAPine (CLOZARIL) tablet 300 mg  300 mg Oral QHS [name], [name], MD   300 mg at 03/26/18 2112  . fluvoxaMINE (LUVOX) tablet 50 mg  50 mg Oral QHS [name], [name], MD   50 mg at 03/26/18 2112  . magnesium hydroxide (MILK OF MAGNESIA) suspension 30 mL  30 mL Oral Daily PRN [name], [name], MD   30 mL at 03/17/18 0530  . metFORMIN (GLUCOPHAGE) tablet 500 mg  500 mg Oral Q breakfast [name], [name], MD   500 mg at 03/27/18 4010  . metoprolol tartrate (LOPRESSOR) tablet 12.5 mg  12.5 mg Oral BID [name], [name], MD   12.5 mg at 03/27/18 0833  . perphenazine (TRILAFON) tablet 8 mg  8 mg Oral BID [name], [name], MD   8 mg at 03/27/18 0833  . traZODone (DESYREL) tablet 100 mg  100 mg Oral QHS [name], [name], MD   100 mg at 03/26/18 2112   PTA Medications: Medications Prior to Admission  Medication Sig Dispense Refill Last Dose  . cloZAPine (CLOZARIL) 100 MG tablet Take 3 tablets (300 mg total) by mouth at bedtime. 90 tablet 1` Unknown at Unknown  . divalproex (DEPAKOTE) 500 MG DR tablet Take 1 tablet (500 mg total) by mouth every 12 (twelve) hours. 60 tablet 1 Unknown at Unknown  . hydrOXYzine (ATARAX/VISTARIL) 50 MG tablet Take 1 tablet (50 mg total) by mouth 3 (three) times daily as needed for anxiety. 90 tablet 1  Unknown at Unknown  . metFORMIN (GLUCOPHAGE) 500 MG tablet Take 1 tablet (500 mg total) by mouth daily with breakfast. 30 tablet 1 Unknown at Unknown  . metoprolol tartrate (LOPRESSOR) 25 MG tablet Take 0.5 tablets (12.5 mg total) by mouth 2 (two) times daily. 60 tablet 1 Unknown at Unknown  . traZODone (DESYREL) 100 MG tablet Take 1 tablet (100 mg total) by mouth at bedtime as needed for sleep. 30 tablet 1 Unknown at Unknown    Patient Stressors: Financial difficulties Health problems  Patient Strengths: Average or above average intelligence Supportive family/friends  Treatment Modalities: Medication Management, Group therapy, Case management,  1 to 1 session with clinician, Psychoeducation, Recreational therapy.   Physician Treatment Plan for Primary Diagnosis: Schizoaffective disorder, bipolar type (HCC) Long Term Goal(s): Improvement in symptoms so as ready for discharge Improvement in symptoms so as ready for discharge   Short Term Goals: Ability to identify changes in lifestyle to reduce recurrence of condition will improve Ability to verbalize feelings will improve Ability to disclose and discuss suicidal ideas Ability to demonstrate self-control will improve Ability to identify and develop effective coping behaviors will improve Ability to maintain clinical measurements within normal limits will improve Compliance with prescribed medications will improve Ability to identify triggers associated with substance abuse/mental health issues will improve Ability to identify changes in lifestyle to  reduce recurrence of condition will improve Ability to verbalize feelings will improve Ability to disclose and discuss suicidal ideas Ability to demonstrate self-control will improve Ability to identify and develop effective coping behaviors will improve Ability to maintain clinical measurements within normal limits will improve Compliance with prescribed medications will improve Ability  to identify triggers associated with substance abuse/mental health issues will improve  Medication Management: Evaluate patient's response, side effects, and tolerance of medication regimen.  Therapeutic Interventions: 1 to 1 sessions, Unit Group sessions and Medication administration.  Evaluation of Outcomes: Progressing  Physician Treatment Plan for Secondary Diagnosis: Principal Problem:   Schizoaffective disorder, bipolar type (HCC) Active Problems:   Noncompliance   Hypertension  Long Term Goal(s): Improvement in symptoms so as ready for discharge Improvement in symptoms so as ready for discharge   Short Term Goals: Ability to identify changes in lifestyle to reduce recurrence of condition will improve Ability to verbalize feelings will improve Ability to disclose and discuss suicidal ideas Ability to demonstrate self-control will improve Ability to identify and develop effective coping behaviors will improve Ability to maintain clinical measurements within normal limits will improve Compliance with prescribed medications will improve Ability to identify triggers associated with substance abuse/mental health issues will improve Ability to identify changes in lifestyle to reduce recurrence of condition will improve Ability to verbalize feelings will improve Ability to disclose and discuss suicidal ideas Ability to demonstrate self-control will improve Ability to identify and develop effective coping behaviors will improve Ability to maintain clinical measurements within normal limits will improve Compliance with prescribed medications will improve Ability to identify triggers associated with substance abuse/mental health issues will improve     Medication Management: Evaluate patient's response, side effects, and tolerance of medication regimen.  Therapeutic Interventions: 1 to 1 sessions, Unit Group sessions and Medication administration.  Evaluation of Outcomes:  Progressing   RN Treatment Plan for Primary Diagnosis: Schizoaffective disorder, bipolar type (HCC) Long Term Goal(s): Knowledge of disease and therapeutic regimen to maintain health will improve  Short Term Goals: Ability to remain free from injury will improve, Ability to demonstrate self-control, Ability to identify and develop effective coping behaviors will improve and Compliance with prescribed medications will improve  Medication Management: RN will administer medications as ordered by provider, will assess and evaluate patient's response and provide education to patient for prescribed medication. RN will report any adverse and/or side effects to prescribing provider.  Therapeutic Interventions: 1 on 1 counseling sessions, Psychoeducation, Medication administration, Evaluate responses to treatment, Monitor vital signs and CBGs as ordered, Perform/monitor CIWA, COWS, AIMS and Fall Risk screenings as ordered, Perform wound care treatments as ordered.  Evaluation of Outcomes: Progressing   LCSW Treatment Plan for Primary Diagnosis: Schizoaffective disorder, bipolar type (HCC) Long Term Goal(s): Safe transition to appropriate next level of care at discharge, Engage patient in therapeutic group addressing interpersonal concerns.  Short Term Goals: Engage patient in aftercare planning with referrals and resources, Increase social support, Identify triggers associated with mental health/substance abuse issues and Increase skills for wellness and recovery  Therapeutic Interventions: Assess for all discharge needs, 1 to 1 time with Social worker, Explore available resources and support systems, Assess for adequacy in community support network, Educate family and significant other(s) on suicide prevention, Complete Psychosocial Assessment, Interpersonal group therapy.  Evaluation of Outcomes: Progressing   Progress in Treatment: Attending groups: No. Participating in groups: No. Taking  medication as prescribed: Yes. Toleration medication: Yes. Family/Significant other contact made: No, will contact:  Patient  refused Patient understands diagnosis: Yes. Discussing patient identified problems/goals with staff: Yes. Medical problems stabilized or resolved: Yes. Denies suicidal/homicidal ideation: Yes. Issues/concerns per patient self-inventory: No. Other:   New problem(s) identified: No, Describe:  None  New Short Term/Long Term Goal(s): "To get back on medications and get better."  Patient Goals:  "To get back on medications and get better."  Discharge Plan or Barriers: To return back to his mothers house and follow up with outpatient treatment.  Reason for Continuation of Hospitalization: Medication stabilization  Estimated Length of Stay: 2-4 days     Attendees: Patient:  03/27/2018   Physician: Dr. Jennet Maduro, MD 03/27/2018   Nursing: Horton Marshall, RN 03/27/2018   RN Care Manager: 03/27/2018   Social Worker: Daleen Squibb, LCSW 03/27/2018   Recreational Therapist: Danella Deis. [facility], LRT 03/27/2018   Other: Huey Romans, LCSW 03/27/2018   Other: Damian Leavell, Chaplain 03/27/2018   Other: 03/27/2018        Scribe for Treatment Team: Lorri Frederick, LCSW 03/27/2018 2:32 PM

## 2018-03-27 NOTE — Consult Note (Signed)
[facility] MEDICATION RELATED CONSULT NOTE - INITIAL   Pharmacy Consult for Clozapine REMs reporting and Lab Monitoring  Indication: Schizoaffective disorder   Labs: Recent Labs    03/27/18 0720  WBC 5.8  HGB 16.0  HCT 45.4  PLT 216   Estimated Creatinine Clearance: 127 mL/min (by C-G formula based on SCr of 1.04 mg/dL).   Microbiology: No results found for this or any previous visit (from the past 720 hour(s)).  Medical History: Past Medical History:  Diagnosis Date  . Asthma   . Depression   . Psychosis [facility])     Assessment: Pharmacy consulted for clozapine lab monitoring and REMs Program Reporting in 22 yo male with PMH of Schizoaffective disorder. Patient prescribed Clozapine 300mg  PTA.   Plan:  09/06 ANC: 2700. Lab reported to online Clozapine REMs Program. Patient is eligible to receive clozapine with weekly lab monitoring.  Next CBC with Diff 09/13   Stormy Card, [facility] 03/27/2018 9:15 AM

## 2018-03-27 NOTE — Progress Notes (Signed)
Arbutus Ped has stayed in bed, avoiding to talk to staff. Did not attend HS activities. Continues to report that voices are telling him not to show his face to anybody except his mom. Patient's mood is improving: was able to discuss his feelings with this writer, reporting that "I miss my mom so much...ibuprofen can't wait to see her". He is alert and oriented. Received medications and did not want to engage into long conversations. Support and encouragements provided. Safety precautions maintained.

## 2018-03-27 NOTE — Plan of Care (Signed)
Patient is alert; denies SI and HI but actively hearing voices; patient more visible in the milieu today. Patient did not cover face while walking but still will not face staff when talking or taking medications. Patient is eating meals during the day regularly ; taking medications; reports pain 0/10. Blood pressure is being managed well today; no issues.Patient does not interact with peers on the unit.

## 2018-03-27 NOTE — Plan of Care (Signed)
Improving in mood but continues to experience command hallucinations. Compliant with medications

## 2018-03-27 NOTE — [facility] Group Notes (Signed)
[facility] LCSW Group Therapy Note  Date/Time: 03/27/18, 1300  Type of Therapy and Topic:  Group Therapy:  Feelings around Relapse and Recovery  Participation Level:  Did Not Attend   Mood:  Description of Group:    Patients in this group will discuss emotions they experience before and after a relapse. They will process how experiencing these feelings, or avoidance of experiencing them, relates to having a relapse. Facilitator will guide patients to explore emotions they have related to recovery. Patients will be encouraged to process which emotions are more powerful. They will be guided to discuss the emotional reaction significant others in their lives may have to patients' relapse or recovery. Patients will be assisted in exploring ways to respond to the emotions of others without this contributing to a relapse.  Therapeutic Goals: 1. Patient will identify two or more emotions that lead to relapse for them:  2. Patient will identify two emotions that result when they relapse:  3. Patient will identify two emotions related to recovery:  4. Patient will demonstrate ability to communicate their needs through discussion and/or role plays.   Summary of Patient Progress:     Therapeutic Modalities:   Cognitive Behavioral Therapy Solution-Focused Therapy Assertiveness Training Relapse Prevention Therapy  Greg Yafet Cline, LCSW       

## 2018-03-28 NOTE — Plan of Care (Signed)
Taking medications. Mood improved but continues to hear voices

## 2018-03-28 NOTE — Plan of Care (Signed)
Patient is alert and oriented with denies SI, HI but has positive Auditory Hallucination. Patient is pleasant but continues to not face staff during conversation due to voices telling him not to look at people. Patient takes medication appropriately; rates pain 0/10 and last BM 03/26/18. Patient isolates to his room, only coming out for meals and when dinning with peers faces the wall in the dinning dayroom. Patient is disheveled; Civil engineer, [employer] provided towels and soap for shower.  Patient has no concerns or complaints besides missing mother and smaller siblings. There are no self harming behaviors observed. Nurse will continue to monitor. Problem: Education: Goal: Ability to state activities that reduce stress will improve Outcome: Progressing   Problem: Coping: Goal: Ability to identify and develop effective coping behavior will improve Outcome: Progressing   Problem: Self-Concept: Goal: Ability to identify factors that promote anxiety will improve Outcome: Progressing Goal: Level of anxiety will decrease Outcome: Progressing Goal: Ability to modify response to factors that promote anxiety will improve Outcome: Progressing   Problem: Education: Goal: Knowledge of General Education information will improve Description Including pain rating scale, medication(s)/side effects and non-pharmacologic comfort measures Outcome: Not Progressing

## 2018-03-28 NOTE — Progress Notes (Signed)
[facility] MD Progress Note  03/28/2018 4:14 PM [name] [name]  MRN:  [id number] Subjective: Follow-up for this young man with psychotic disorder.  As he has been for days he is in his bed with the cover pulled over his face.  He is awake and will engage in some conversation.  Denies any symptoms.  Denies suicidal or homicidal thoughts.  Denies psychotic symptoms.  Shows little inclination to be able to talk through any appropriate plan however.  No new physical complaints. Principal Problem: Schizoaffective disorder, bipolar type (HCC) Diagnosis:   Patient Active Problem List   Diagnosis Date Noted  . Hypertension [I10] 03/06/2018  . Schizoaffective disorder, bipolar type (HCC) [F25.0] 12/30/2017  . Noncompliance [Z91.19] 12/30/2017  . Aggressive behavior of adolescent [R46.89] 10/24/2015   Total Time spent with patient: 20 minutes  Past Psychiatric History: Past history of multiple recent hospitalizations poor compliance with outpatient treatment poor insight.  Past Medical History:  Past Medical History:  Diagnosis Date  . Asthma   . Depression   . Psychosis [facility])     Past Surgical History:  Procedure Laterality Date  . BACK SURGERY     Family History: History reviewed. No pertinent family history. Family Psychiatric  History: See previous note Social History:  Social History   Substance and Sexual Activity  Alcohol Use No     Social History   Substance and Sexual Activity  Drug Use Yes  . Types: Marijuana    Social History   Socioeconomic History  . Marital status: Single    Spouse name: Not on file  . Number of children: Not on file  . Years of education: Not on file  . Highest education level: Not on file  Occupational History  . Not on file  Social Needs  . Financial resource strain: Not on file  . Food insecurity:    Worry: Not on file    Inability: Not on file  . Transportation needs:    Medical: Not on file    Non-medical: Not on file  Tobacco Use  .  Smoking status: Never Smoker  . Smokeless tobacco: Never Used  Substance and Sexual Activity  . Alcohol use: No  . Drug use: Yes    Types: Marijuana  . Sexual activity: Not Currently  Lifestyle  . Physical activity:    Days per week: Not on file    Minutes per session: Not on file  . Stress: Not on file  Relationships  . Social connections:    Talks on phone: Not on file    Gets together: Not on file    Attends religious service: Not on file    Active member of club or organization: Not on file    Attends meetings of clubs or organizations: Not on file    Relationship status: Not on file  Other Topics Concern  . Not on file  Social History Narrative  . Not on file   Additional Social History:                         Sleep: Good  Appetite:  Fair  Current Medications: Current Facility-Administered Medications  Medication Dose Route Frequency Provider Last Rate Last Dose  . acetaminophen (TYLENOL) tablet 650 mg  650 mg Oral Q6H PRN [name], [name], MD      . alum & mag hydroxide-simeth (MAALOX/MYLANTA) 200-200-20 MG/5ML suspension 30 mL  30 mL Oral Q4H PRN [name], [name], MD      .  cloZAPine (CLOZARIL) tablet 300 mg  300 mg Oral QHS [name], [name], MD   300 mg at 03/27/18 2125  . fluvoxaMINE (LUVOX) tablet 50 mg  50 mg Oral QHS [name], [name], MD   50 mg at 03/27/18 2125  . magnesium hydroxide (MILK OF MAGNESIA) suspension 30 mL  30 mL Oral Daily PRN [name], [name], MD   30 mL at 03/17/18 0530  . metFORMIN (GLUCOPHAGE) tablet 500 mg  500 mg Oral Q breakfast [name], [name], MD   500 mg at 03/28/18 0811  . metoprolol tartrate (LOPRESSOR) tablet 12.5 mg  12.5 mg Oral BID [name], [name], MD   12.5 mg at 03/28/18 0811  . perphenazine (TRILAFON) tablet 8 mg  8 mg Oral BID [name], [name], MD   8 mg at 03/28/18 0810  . traZODone (DESYREL) tablet 100 mg  100 mg Oral QHS [name], [name], MD   100 mg at 03/27/18 2125    Lab Results:  Results for orders  placed or performed during the hospital encounter of 03/07/18 (from the past 48 hour(s))  CBC with Differential/Platelet     Status: None   Collection Time: 03/27/18  7:20 AM  Result Value Ref Range   WBC 5.8 3.8 - 10.6 K/uL   RBC 5.59 4.40 - 5.90 MIL/uL   Hemoglobin 16.0 13.0 - 18.0 g/dL   HCT 16.1 09.6 - 04.5 %   MCV 81.3 80.0 - 100.0 fL   MCH 28.6 26.0 - 34.0 pg   MCHC 35.2 32.0 - 36.0 g/dL   RDW 40.9 81.1 - 91.4 %   Platelets 216 150 - 440 K/uL   Neutrophils Relative % 48 %   Neutro Abs 2.7 1.4 - 6.5 K/uL   Lymphocytes Relative 41 %   Lymphs Abs 2.4 1.0 - 3.6 K/uL   Monocytes Relative 11 %   Monocytes Absolute 0.7 0.2 - 1.0 K/uL   Eosinophils Relative 0 %   Eosinophils Absolute 0.0 0 - 0.7 K/uL   Basophils Relative 0 %   Basophils Absolute 0.0 0 - 0.1 K/uL    Comment: Performed at [facility], [address] [place], [place] [address]    Blood Alcohol level:  Lab Results  Component Value Date   [facility] <10 03/06/2018   ETH <10 12/30/2017    Metabolic Disorder Labs: Lab Results  Component Value Date   HGBA1C 4.9 03/08/2018   MPG 93.93 03/08/2018   MPG 85.32 12/30/2017   No results found for: PROLACTIN Lab Results  Component Value Date   CHOL 238 (H) 03/08/2018   TRIG 69 03/08/2018   HDL 56 03/08/2018   CHOLHDL 4.3 03/08/2018   VLDL 14 03/08/2018   LDLCALC 168 (H) 03/08/2018   LDLCALC 97 12/30/2017    Physical Findings: AIMS: Facial and Oral Movements Muscles of Facial Expression: None, normal Lips and Perioral Area: None, normal Jaw: None, normal Tongue: None, normal,Extremity Movements Upper (arms, wrists, hands, fingers): None, normal Lower (legs, knees, ankles, toes): None, normal, Trunk Movements Neck, shoulders, hips: None, normal, Overall Severity Severity of abnormal movements (highest score from questions above): None, normal Incapacitation due to abnormal movements: None, normal Patient's awareness of abnormal movements (rate only  patient's report): No Awareness, Dental Status Current problems with teeth and/or dentures?: No Does patient usually wear dentures?: No  CIWA:    COWS:     Musculoskeletal: Strength & Muscle Tone: within normal limits Gait & Station: normal Patient leans: N/A  Psychiatric  Specialty Exam: Physical Exam  Nursing note and vitals reviewed. Constitutional: He appears well-developed and well-nourished.  HENT:  Head: Normocephalic and atraumatic.  Eyes: Pupils are equal, round, and reactive to light. Conjunctivae are normal.  Neck: Normal range of motion.  Cardiovascular: Regular rhythm and normal heart sounds.  Respiratory: Effort normal. No respiratory distress.  GI: Soft.  Musculoskeletal: Normal range of motion.  Neurological: He is alert.  Skin: Skin is warm and dry.  Psychiatric: His affect is blunt. His speech is delayed. He is withdrawn. Cognition and memory are impaired. He expresses impulsivity and inappropriate judgment. He expresses no homicidal and no suicidal ideation.    Review of Systems  Constitutional: Negative.   HENT: Negative.   Eyes: Negative.   Respiratory: Negative.   Cardiovascular: Negative.   Gastrointestinal: Negative.   Musculoskeletal: Negative.   Skin: Negative.   Neurological: Negative.   Psychiatric/Behavioral: Negative.     Blood pressure 115/71, pulse 84, temperature 98.2 F (36.8 C), temperature source Oral, resp. rate 20, height 6' 0.44" (1.84 m), weight 86.2 kg, SpO2 100 %.Body mass index is 25.46 kg/m.  General Appearance: Disheveled  Eye Contact:  Minimal  Speech:  Blocked  Volume:  Decreased  Mood:  Euthymic  Affect:  Constricted  Thought Process:  Disorganized  Orientation:  Full (Time, Place, and Person)  Thought Content:  Illogical  Suicidal Thoughts:  No  Homicidal Thoughts:  No  Memory:  Immediate;   Fair Recent;   Fair Remote;   Fair  Judgement:  Impaired  Insight:  Shallow  Psychomotor Activity:  Decreased   Concentration:  Concentration: Poor  Recall:  [employer] of Knowledge:  Fair  Language:  Fair  Akathisia:  No  Handed:  Right  AIMS (if indicated):     Assets:  Physical Health Social Support  ADL's:  Impaired  Cognition:  Impaired,  Mild  Sleep:  Number of Hours: 8.5     Treatment Plan Summary: Daily contact with patient to assess and evaluate symptoms and progress in treatment, Medication management and Plan Difficult patient.  Will not engage in much conversation.  Insight poor.  Not willing to really even talk about his situation but appears to continue to be very disorganized paranoid probably and showing limited inclination to improve.  Unclear where he will be living after discharge.  No change to medication today.  Tried to do a little supportive counseling and encouragement to the patient.  Mordecai Rasmussen, MD 03/28/2018, 4:14 PM

## 2018-03-28 NOTE — Progress Notes (Signed)
Patient stayed in bed and continues to endorse hallucinations: voices telling him not to look at people's face. Reports that he is feel some improvement in mood and states "I am hoping to leave soon". Reports that he was able to speak to his mother today. Patient received his bedtime medications and had no other concerns. Support and encouragements provided. Staff continue to monitor for safety.

## 2018-03-28 NOTE — [facility] Group Notes (Signed)
[facility] Group Notes:  (Nursing/MHT/Case Management/Adjunct)  Date:  03/28/2018  Time:  1:38 AM  Type of Therapy:  Group Therapy  Participation Level:  Did Not Attend   [name] 03/28/2018, 1:38 AM

## 2018-03-28 NOTE — [facility] Group Notes (Signed)
LCSW Group Therapy Note   03/28/2018 1:15pm   Type of Therapy and Topic:  Group Therapy:  Trust and Honesty  Participation Level:  Did Not Attend  Description of Group:    In this group patients will be asked to explore the value of being honest.  Patients will be guided to discuss their thoughts, feelings, and behaviors related to honesty and trusting in others. Patients will process together how trust and honesty relate to forming relationships with peers, family members, and self. Each patient will be challenged to identify and express feelings of being vulnerable. Patients will discuss reasons why people are dishonest and identify alternative outcomes if one was truthful (to self or others). This group will be process-oriented, with patients participating in exploration of their own experiences, giving and receiving support, and processing challenge from other group members.   Therapeutic Goals: 1. Patient will identify why honesty is important to relationships and how honesty overall affects relationships.  2. Patient will identify a situation where they lied or were lied too and the  feelings, thought process, and behaviors surrounding the situation 3. Patient will identify the meaning of being vulnerable, how that feels, and how that correlates to being honest with self and others. 4. Patient will identify situations where they could have told the truth, but instead lied and explain reasons of dishonesty.   Summary of Patient Progress:Pt was invited to attend group but chose not to attend. CSW will continue to encourage pt to attend group throughout their admission.     Therapeutic Modalities:   Cognitive Behavioral Therapy Solution Focused Therapy Motivational Interviewing Brief Therapy  Tinisha Etzkorn  CUEBAS-COLON, LCSW 03/28/2018 12:55 PM  

## 2018-03-29 NOTE — Progress Notes (Signed)
[facility] MD Progress Note  03/29/2018 11:34 AM [name] [name]  MRN:  [id number] Subjective: Patient has no complaints.  He says that he talked to his mother.  He denies suicidal thoughts.  Denies hallucinations or psychotic symptoms.  Nursing notices that the patient hardly ever gets up.  Only eats what people bring back to him.  His pulse was elevated and his blood pressure was low which is probably related to dehydration.  He did not have any complaints regarding it. Principal Problem: Schizoaffective disorder, bipolar type (HCC) Diagnosis:   Patient Active Problem List   Diagnosis Date Noted  . Hypertension [I10] 03/06/2018  . Schizoaffective disorder, bipolar type (HCC) [F25.0] 12/30/2017  . Noncompliance [Z91.19] 12/30/2017  . Aggressive behavior of adolescent [R46.89] 10/24/2015   Total Time spent with patient: 15 minutes  Past Psychiatric History: History of psychosis recurrent  Past Medical History:  Past Medical History:  Diagnosis Date  . Asthma   . Depression   . Psychosis [facility])     Past Surgical History:  Procedure Laterality Date  . BACK SURGERY     Family History: History reviewed. No pertinent family history. Family Psychiatric  History: None noted Social History:  Social History   Substance and Sexual Activity  Alcohol Use No     Social History   Substance and Sexual Activity  Drug Use Yes  . Types: Marijuana    Social History   Socioeconomic History  . Marital status: Single    Spouse name: Not on file  . Number of children: Not on file  . Years of education: Not on file  . Highest education level: Not on file  Occupational History  . Not on file  Social Needs  . Financial resource strain: Not on file  . Food insecurity:    Worry: Not on file    Inability: Not on file  . Transportation needs:    Medical: Not on file    Non-medical: Not on file  Tobacco Use  . Smoking status: Never Smoker  . Smokeless tobacco: Never Used  Substance and Sexual  Activity  . Alcohol use: No  . Drug use: Yes    Types: Marijuana  . Sexual activity: Not Currently  Lifestyle  . Physical activity:    Days per week: Not on file    Minutes per session: Not on file  . Stress: Not on file  Relationships  . Social connections:    Talks on phone: Not on file    Gets together: Not on file    Attends religious service: Not on file    Active member of club or organization: Not on file    Attends meetings of clubs or organizations: Not on file    Relationship status: Not on file  Other Topics Concern  . Not on file  Social History Narrative  . Not on file   Additional Social History:                         Sleep: Fair  Appetite:  Fair  Current Medications: Current Facility-Administered Medications  Medication Dose Route Frequency Provider Last Rate Last Dose  . acetaminophen (TYLENOL) tablet 650 mg  650 mg Oral Q6H PRN Nancylee Gaines T, MD      . alum & mag hydroxide-simeth (MAALOX/MYLANTA) 200-200-20 MG/5ML suspension 30 mL  30 mL Oral Q4H PRN Consuela Widener T, MD      . cloZAPine (CLOZARIL) tablet 300 mg  300 mg Oral QHS Dennis Hegeman, [name], MD   300 mg at 03/28/18 2145  . fluvoxaMINE (LUVOX) tablet 50 mg  50 mg Oral QHS [name], [name], MD   50 mg at 03/28/18 2145  . magnesium hydroxide (MILK OF MAGNESIA) suspension 30 mL  30 mL Oral Daily PRN Edwin Cherian T, MD   30 mL at 03/17/18 0530  . metFORMIN (GLUCOPHAGE) tablet 500 mg  500 mg Oral Q breakfast Mirha Brucato, [name], MD   500 mg at 03/29/18 0805  . metoprolol tartrate (LOPRESSOR) tablet 12.5 mg  12.5 mg Oral BID Tishia Maestre T, MD   12.5 mg at 03/28/18 1718  . perphenazine (TRILAFON) tablet 8 mg  8 mg Oral BID [name], [name], MD   8 mg at 03/29/18 0805  . traZODone (DESYREL) tablet 100 mg  100 mg Oral QHS Caya Soberanis, [name], MD   100 mg at 03/28/18 2145    Lab Results: No results found for this or any previous visit (from the past 48 hour(s)).  Blood Alcohol level:  Lab  Results  Component Value Date   ETH <10 03/06/2018   ETH <10 12/30/2017    Metabolic Disorder Labs: Lab Results  Component Value Date   HGBA1C 4.9 03/08/2018   MPG 93.93 03/08/2018   MPG 85.32 12/30/2017   No results found for: PROLACTIN Lab Results  Component Value Date   CHOL 238 (H) 03/08/2018   TRIG 69 03/08/2018   HDL 56 03/08/2018   CHOLHDL 4.3 03/08/2018   VLDL 14 03/08/2018   LDLCALC 168 (H) 03/08/2018   LDLCALC 97 12/30/2017    Physical Findings: AIMS: Facial and Oral Movements Muscles of Facial Expression: None, normal Lips and Perioral Area: None, normal Jaw: None, normal Tongue: None, normal,Extremity Movements Upper (arms, wrists, hands, fingers): None, normal Lower (legs, knees, ankles, toes): None, normal, Trunk Movements Neck, shoulders, hips: None, normal, Overall Severity Severity of abnormal movements (highest score from questions above): None, normal Incapacitation due to abnormal movements: None, normal Patient's awareness of abnormal movements (rate only patient's report): No Awareness, Dental Status Current problems with teeth and/or dentures?: No Does patient usually wear dentures?: No  CIWA:    COWS:     Musculoskeletal: Strength & Muscle Tone: within normal limits Gait & Station: normal Patient leans: N/A  Psychiatric Specialty Exam: Physical Exam  Nursing note and vitals reviewed. Constitutional: He appears well-developed and well-nourished.  HENT:  Head: Normocephalic and atraumatic.  Eyes: Pupils are equal, round, and reactive to light. Conjunctivae are normal.  Neck: Normal range of motion.  Cardiovascular: Regular rhythm and normal heart sounds.  Respiratory: Effort normal. No respiratory distress.  GI: Soft.  Musculoskeletal: Normal range of motion.  Neurological: He is alert.  Skin: Skin is warm and dry.  Psychiatric: His affect is blunt. His speech is delayed. He is slowed. Cognition and memory are impaired. He expresses  inappropriate judgment. He expresses no homicidal and no suicidal ideation.    Review of Systems  Constitutional: Negative.   HENT: Negative.   Eyes: Negative.   Respiratory: Negative.   Cardiovascular: Negative.   Gastrointestinal: Negative.   Musculoskeletal: Negative.   Skin: Negative.   Neurological: Negative.   Psychiatric/Behavioral: Negative for depression, hallucinations, memory loss, substance abuse and suicidal ideas. The patient is not nervous/anxious and does not have insomnia.     Blood pressure (S) (!) 104/51, pulse (!) 106, temperature (!) 97.5 F (36.4 C), temperature source Oral, resp. rate 16, height 6'  0.44" (1.84 m), weight 86.2 kg, SpO2 100 %.Body mass index is 25.46 kg/m.  General Appearance: Casual  Eye Contact:  Fair  Speech:  Slow  Volume:  Decreased  Mood:  Euthymic  Affect:  Constricted  Thought Process:  Disorganized  Orientation:  Full (Time, Place, and Person)  Thought Content:  Illogical  Suicidal Thoughts:  No  Homicidal Thoughts:  No  Memory:  Immediate;   Fair Recent;   Fair Remote;   Fair  Judgement:  Impaired  Insight:  Shallow  Psychomotor Activity:  Decreased  Concentration:  Concentration: Fair  Recall:  [employer] of Knowledge:  Fair  Language:  Good  Akathisia:  No  Handed:  Right  AIMS (if indicated):     Assets:  Desire for Improvement Housing  ADL's:  Impaired  Cognition:  Impaired,  Mild  Sleep:  Number of Hours: 7.15     Treatment Plan Summary: Daily contact with patient to assess and evaluate symptoms and progress in treatment, Medication management and Plan Stays in bed all the time.  Poor insight.  Denies symptoms denies psychosis denies suicidal ideation.  Still with poor insight and poor ability to function outside the hospital.  Placement issues still pending.  Tried to encourage him to get up and eat more.  Mordecai Rasmussen, MD 03/29/2018, 11:34 AM

## 2018-03-29 NOTE — Progress Notes (Addendum)
[facility] MD Progress Note  03/30/2018 9:30 AM [name] [name]  MRN:  [id number]  Subjective:    [name] [name] requested to talk to me this morning to inform me that he was able to contact his mother on the phone over the weekend, that the conversation went well and his mother is ready to pick him up today at 6:00 PM. He reports feeling much better but still must obey the voices telling him to avoid looking at people's faces. Indeed, he sits in bed with his face turned away, not looking at me. He misses his mother and brother and wants to go home and believes that he will be able to manage. He sounds better and acknowledges the need for medications but I do not believe he is ready for discharge.  I tried to call his mother several times this morning without success.   Spoke with the mother in PM. She was not aware of changes in his behavior since she has not visited. She agrees that [name] is not ready and will visit on Wednesday. She was made aware that ACT team will be contacting her.  Principal Problem: Schizoaffective disorder, bipolar type (HCC) Diagnosis:   Patient Active Problem List   Diagnosis Date Noted  . Schizoaffective disorder, bipolar type (HCC) [F25.0] 12/30/2017    Priority: High  . Hypertension [I10] 03/06/2018  . Noncompliance [Z91.19] 12/30/2017  . Aggressive behavior of adolescent [R46.89] 10/24/2015   Total Time spent with patient: 20 minutes  Past Psychiatric History: schizophrenia   Past Medical History:  Past Medical History:  Diagnosis Date  . Asthma   . Depression   . Psychosis [facility])     Past Surgical History:  Procedure Laterality Date  . BACK SURGERY     Family History: History reviewed. No pertinent family history. Family Psychiatric  History: none Social History:  Social History   Substance and Sexual Activity  Alcohol Use No     Social History   Substance and Sexual Activity  Drug Use Yes  . Types: Marijuana    Social History   Socioeconomic  History  . Marital status: Single    Spouse name: Not on file  . Number of children: Not on file  . Years of education: Not on file  . Highest education level: Not on file  Occupational History  . Not on file  Social Needs  . Financial resource strain: Not on file  . Food insecurity:    Worry: Not on file    Inability: Not on file  . Transportation needs:    Medical: Not on file    Non-medical: Not on file  Tobacco Use  . Smoking status: Never Smoker  . Smokeless tobacco: Never Used  Substance and Sexual Activity  . Alcohol use: No  . Drug use: Yes    Types: Marijuana  . Sexual activity: Not Currently  Lifestyle  . Physical activity:    Days per week: Not on file    Minutes per session: Not on file  . Stress: Not on file  Relationships  . Social connections:    Talks on phone: Not on file    Gets together: Not on file    Attends religious service: Not on file    Active member of club or organization: Not on file    Attends meetings of clubs or organizations: Not on file    Relationship status: Not on file  Other Topics Concern  . Not on file  Social History  Narrative  . Not on file   Additional Social History:                         Sleep: Fair  Appetite:  Fair  Current Medications: Current Facility-Administered Medications  Medication Dose Route Frequency Provider Last Rate Last Dose  . acetaminophen (TYLENOL) tablet 650 mg  650 mg Oral Q6H PRN [name], [name], MD      . alum & mag hydroxide-simeth (MAALOX/MYLANTA) 200-200-20 MG/5ML suspension 30 mL  30 mL Oral Q4H PRN [name], [name], MD      . cloZAPine (CLOZARIL) tablet 300 mg  300 mg Oral QHS [name], [name], MD   300 mg at 03/29/18 2133  . fluvoxaMINE (LUVOX) tablet 50 mg  50 mg Oral QHS Illias Pantano B, MD   50 mg at 03/29/18 2133  . magnesium hydroxide (MILK OF MAGNESIA) suspension 30 mL  30 mL Oral Daily PRN [name], [name], MD   30 mL at 03/17/18 0530  . metFORMIN (GLUCOPHAGE) tablet  500 mg  500 mg Oral Q breakfast [name], [name], MD   500 mg at 03/30/18 0816  . metoprolol tartrate (LOPRESSOR) tablet 12.5 mg  12.5 mg Oral BID [name], [name], MD   12.5 mg at 03/30/18 0816  . perphenazine (TRILAFON) tablet 8 mg  8 mg Oral BID Eldean Klatt B, MD   8 mg at 03/30/18 0816  . traZODone (DESYREL) tablet 100 mg  100 mg Oral QHS [name], [name], MD   100 mg at 03/29/18 2133    Lab Results: No results found for this or any previous visit (from the past 48 hour(s)).  Blood Alcohol level:  Lab Results  Component Value Date   ETH <10 03/06/2018   ETH <10 12/30/2017    Metabolic Disorder Labs: Lab Results  Component Value Date   HGBA1C 4.9 03/08/2018   MPG 93.93 03/08/2018   MPG 85.32 12/30/2017   No results found for: PROLACTIN Lab Results  Component Value Date   CHOL 238 (H) 03/08/2018   TRIG 69 03/08/2018   HDL 56 03/08/2018   CHOLHDL 4.3 03/08/2018   VLDL 14 03/08/2018   LDLCALC 168 (H) 03/08/2018   LDLCALC 97 12/30/2017    Physical Findings: AIMS: Facial and Oral Movements Muscles of Facial Expression: None, normal Lips and Perioral Area: None, normal Jaw: None, normal Tongue: None, normal,Extremity Movements Upper (arms, wrists, hands, fingers): None, normal Lower (legs, knees, ankles, toes): None, normal, Trunk Movements Neck, shoulders, hips: None, normal, Overall Severity Severity of abnormal movements (highest score from questions above): None, normal Incapacitation due to abnormal movements: None, normal Patient's awareness of abnormal movements (rate only patient's report): No Awareness, Dental Status Current problems with teeth and/or dentures?: No Does patient usually wear dentures?: No  CIWA:    COWS:     Musculoskeletal: Strength & Muscle Tone: within normal limits Gait & Station: normal Patient leans: N/A  Psychiatric Specialty Exam: Physical Exam  Nursing note and vitals reviewed. Psychiatric: His affect is blunt. His speech  is delayed. He is slowed and actively hallucinating. Thought content is paranoid and delusional. Cognition and memory are impaired. He expresses impulsivity.    Review of Systems  Neurological: Negative.   Psychiatric/Behavioral: Positive for hallucinations.  All other systems reviewed and are negative.   Blood pressure 128/79, pulse (!) 105, temperature 97.9 F (36.6 C), temperature source Oral, resp. rate 16, height 6' 0.44" (1.84 m), weight  86.2 kg, SpO2 100 %.Body mass index is 25.46 kg/m.  General Appearance: Casual  Eye Contact:  None  Speech:  Slow  Volume:  Decreased  Mood:  Euthymic  Affect:  Blunt  Thought Process:  Linear  Orientation:  Full (Time, Place, and Person)  Thought Content:  Illogical, Delusions, Hallucinations: Auditory and Paranoid Ideation  Suicidal Thoughts:  No  Homicidal Thoughts:  No  Memory:  Immediate;   Poor Recent;   Poor Remote;   Poor  Judgement:  Impaired  Insight:  Lacking  Psychomotor Activity:  Decreased  Concentration:  Concentration: Poor and Attention Span: Poor  Recall:  Poor  Fund of Knowledge:  Poor  Language:  Poor  Akathisia:  No  Handed:  Right  AIMS (if indicated):     Assets:  Desire for Improvement Financial Resources/Insurance Housing Physical Health Resilience Social Support  ADL's:  Intact  Cognition:  WNL  Sleep:  Number of Hours: 7.5     Treatment Plan Summary: Daily contact with patient to assess and evaluate symptoms and progress in treatment and Medication management   Mr. Rhatigan is a 22 year old male with a history of schizophrenia admitted for another psychotic break in the context of treatment noncompliance.The patient is still floridly psychotic andbizarre. He appears to be compliant with medications but his progress has been very slow. The patient has not been able to participate in discharge planningor programming.He was referred to [facility].   #Psychosis, not  improving -continueClozapine 300 mg nightly, Clo level213 only on 8/30, recheck Clo level -continueLuvox 50 mg nightly for augmentation, started on 9/3 -discontinueDepakote, VPA level today 99, ammonia 57 -increaseTrilafonto 8 mg BIDfor psychosis -Trazodone 150 mg nightly  #Metabolic syndrome prevention -Metformin 500 mg BID  #Tachycardia, likely from Clozapine, resolved with treatment -Metoprolol 12.5 BID  #Labs were obtainedrecently  #Social -patient would benefit from help of the guardian -patient, in my opinion, should be considered disabled  #Disposition -discharge to home with the mother -needs family meeting -[facility] ACT teamaccepted the patient  Kristine Linea, MD 03/30/2018, 9:30 AM

## 2018-03-29 NOTE — Progress Notes (Signed)
Patient isolative to self and room. Refuses to look Clinical research associate in eyes. Answers questions. Medication compliant.  Patient remains safe on unit with q 15 min checks.

## 2018-03-29 NOTE — Progress Notes (Signed)
Patient has been in his room. Currently sitting in the chair. Alert and oriented. Continues to avoid eye contact, reporting that the voices tell him not to look at people's face. Patient said that he was able to call his mother and they both had a good conversation. Patient discussed his mental status with this Clinical research associate, reporting that he started hearing voices at age 22. He verbalized understanding of medication need in order to control his hallucinations.  Received his bedtime medications but refused snacks. Was encouraged to talk to staff. Safety precautions maintained.

## 2018-03-29 NOTE — [facility] Group Notes (Signed)
LCSW Group Therapy Note 03/29/2018 1:15pm  Type of Therapy and Topic: Group Therapy: Feelings Around Returning Home & Establishing a Supportive Framework and Supporting Oneself When Supports Not Available  Participation Level: Did Not Attend  Description of Group:  Patients first processed thoughts and feelings about upcoming discharge. These included fears of upcoming changes, lack of change, new living environments, judgements and expectations from others and overall stigma of mental health issues. The group then discussed the definition of a supportive framework, what that looks and feels like, and how do to discern it from an unhealthy non-supportive network. The group identified different types of supports as well as what to do when your family/friends are less than helpful or unavailable  Therapeutic Goals  1. Patient will identify one healthy supportive network that they can use at discharge. 2. Patient will identify one factor of a supportive framework and how to tell it from an unhealthy network. 3. Patient able to identify one coping skill to use when they do not have positive supports from others. 4. Patient will demonstrate ability to communicate their needs through discussion and/or role plays.  Summary of Patient Progress:  Pt was invited to attend group but chose not to attend. CSW will continue to encourage pt to attend group throughout their admission.   Therapeutic Modalities Cognitive Behavioral Therapy Motivational Interviewing   [name] [name]  CUEBAS-COLON, LCSW 03/29/2018 11:01 AM 

## 2018-03-29 NOTE — Plan of Care (Signed)
  Problem: Safety: Goal: Ability to remain free from injury will improve Outcome: Progressing   

## 2018-03-29 NOTE — Plan of Care (Signed)
Compliant with medications. Stayed in room and continues to hear voices

## 2018-03-30 NOTE — Progress Notes (Signed)
Recreation Therapy Notes  Date: 03/30/2018  Time: 9:30 am   Location: Craft room   Behavioral response: N/A   Intervention Topic: Communication  Discussion/Intervention: Patient did not attend group.   Clinical Observations/Feedback:  Patient did not attend group.   Dadrian Ballantine LRT/CTRS        Kinsly Hild 03/30/2018 10:28 AM

## 2018-03-30 NOTE — Progress Notes (Signed)
Nursing note 7p-7a  Pt observed isolating in room this shift, pt was able to come out of his room for medication administration and snack. Displayed a flat affect and mood upon interaction with this Clinical research associate. Pt continued to turn away from this RN, pt would not look this RN in the face. Pt very polite in conversation. Pt denies pain ,denies SI/HI, and also denies any audio or visual hallucinations at this time. Pt endorses that he has hallucinations sometimes but not now, was unable to sated what he hears or sees. Stated "Its hard to explain"  And would not further the conversation. Pt is able to verbally contract for safety with this RN. Pt is now resting in bed with eyes closed, with no signs or symptoms of pain or distress noted. Pt continues to remain safe on the unit and is observed by rounding every 15 min. RN will continue to monitor.

## 2018-03-30 NOTE — Plan of Care (Signed)
  Problem: Education: Goal: Ability to state activities that reduce stress will improve Outcome: Progressing   Problem: Coping: Goal: Ability to identify and develop effective coping behavior will improve Outcome: Progressing   Problem: Self-Concept: Goal: Ability to identify factors that promote anxiety will improve Outcome: Progressing Goal: Level of anxiety will decrease Outcome: Progressing Goal: Ability to modify response to factors that promote anxiety will improve Outcome: Progressing   Problem: Education: Goal: Knowledge of General Education information will improve Description Including pain rating scale, medication(s)/side effects and non-pharmacologic comfort measures Outcome: Progressing   Problem: Health Behavior/Discharge Planning: Goal: Ability to manage health-related needs will improve Outcome: Progressing   Problem: Clinical Measurements: Goal: Ability to maintain clinical measurements within normal limits will improve Outcome: Progressing Goal: Will remain free from infection Outcome: Progressing Goal: Diagnostic test results will improve Outcome: Progressing Goal: Respiratory complications will improve Outcome: Progressing Goal: Cardiovascular complication will be avoided Outcome: Progressing   Problem: Nutrition: Goal: Adequate nutrition will be maintained Outcome: Progressing   Problem: Coping: Goal: Level of anxiety will decrease Outcome: Progressing   Problem: Elimination: Goal: Will not experience complications related to bowel motility Outcome: Progressing Goal: Will not experience complications related to urinary retention Outcome: Progressing   Problem: Pain Managment: Goal: General experience of comfort will improve Outcome: Progressing   Problem: Safety: Goal: Ability to remain free from injury will improve Outcome: Progressing   Problem: Skin Integrity: Goal: Risk for impaired skin integrity will decrease Outcome:  Progressing   Problem: Activity: Goal: Will verbalize the importance of balancing activity with adequate rest periods Outcome: Progressing   Problem: Education: Goal: Will be free of psychotic symptoms Outcome: Progressing Goal: Knowledge of the prescribed therapeutic regimen will improve Outcome: Progressing   Problem: Coping: Goal: Coping ability will improve Outcome: Progressing Goal: Will verbalize feelings Outcome: Progressing   Problem: Health Behavior/Discharge Planning: Goal: Compliance with prescribed medication regimen will improve Outcome: Progressing   Problem: Nutritional: Goal: Ability to achieve adequate nutritional intake will improve Outcome: Progressing   Problem: Role Relationship: Goal: Ability to communicate needs accurately will improve Outcome: Progressing Goal: Ability to interact with others will improve Outcome: Progressing   Problem: Safety: Goal: Ability to redirect hostility and anger into socially appropriate behaviors will improve Outcome: Progressing Goal: Ability to remain free from injury will improve Outcome: Progressing   Problem: Self-Care: Goal: Ability to participate in self-care as condition permits will improve Outcome: Progressing   Problem: Self-Concept: Goal: Will verbalize positive feelings about self Outcome: Progressing

## 2018-03-30 NOTE — Plan of Care (Signed)
Patient is alert and oriented; patient denies SI and HI, but continues to hear voices and hide face when talking with staff. Patient expresses need to be compliant with medications due to the voices he hears. Patient is pleasant and continues to isolate to his room. Patient denies pain 0/10. Shower supplies available in room although patient is disheveled in scrubs. Nurse will continue to monitor. Problem: Coping: Goal: Ability to identify and develop effective coping behavior will improve Outcome: Progressing   Problem: Education: Goal: Knowledge of General Education information will improve Description Including pain rating scale, medication(s)/side effects and non-pharmacologic comfort measures Outcome: Not Progressing   Problem: Education: Goal: Ability to state activities that reduce stress will improve Outcome: Not Progressing

## 2018-03-30 NOTE — Progress Notes (Signed)
[facility] MD Progress Note  03/31/2018 4:18 PM Janmichael TRELL GETZ  MRN:  [id number]  Subjective:    Mr. Conteh is a touch better today. He is not in bed anymore but up and talking to me standing. He still turns his body away not to look. His mother will come to visit tomorrow to decide if he is ready to return to home. He takes medications, tolerates them well, reports no somatic concerns. He understands that he needs to continue on medications and agrees to work with [facility] ACT team.   Principal Problem: Schizoaffective disorder, bipolar type [facility]) Diagnosis:   Patient Active Problem List   Diagnosis Date Noted  . Schizoaffective disorder, bipolar type (HCC) [F25.0] 12/30/2017    Priority: High  . Hypertension [I10] 03/06/2018  . Noncompliance [Z91.19] 12/30/2017  . Aggressive behavior of adolescent [R46.89] 10/24/2015   Total Time spent with patient: 20 minutes  Past Psychiatric History: schizophrenia  Past Medical History:  Past Medical History:  Diagnosis Date  . Asthma   . Depression   . Psychosis [facility])     Past Surgical History:  Procedure Laterality Date  . BACK SURGERY     Family History: History reviewed. No pertinent family history. Family Psychiatric  History: none Social History:  Social History   Substance and Sexual Activity  Alcohol Use No     Social History   Substance and Sexual Activity  Drug Use Yes  . Types: Marijuana    Social History   Socioeconomic History  . Marital status: Single    Spouse name: Not on file  . Number of children: Not on file  . Years of education: Not on file  . Highest education level: Not on file  Occupational History  . Not on file  Social Needs  . Financial resource strain: Not on file  . Food insecurity:    Worry: Not on file    Inability: Not on file  . Transportation needs:    Medical: Not on file    Non-medical: Not on file  Tobacco Use  . Smoking status: Never Smoker  . Smokeless tobacco: Never Used   Substance and Sexual Activity  . Alcohol use: No  . Drug use: Yes    Types: Marijuana  . Sexual activity: Not Currently  Lifestyle  . Physical activity:    Days per week: Not on file    Minutes per session: Not on file  . Stress: Not on file  Relationships  . Social connections:    Talks on phone: Not on file    Gets together: Not on file    Attends religious service: Not on file    Active member of club or organization: Not on file    Attends meetings of clubs or organizations: Not on file    Relationship status: Not on file  Other Topics Concern  . Not on file  Social History Narrative  . Not on file   Additional Social History:                         Sleep: Fair  Appetite:  Fair  Current Medications: Current Facility-Administered Medications  Medication Dose Route Frequency Provider Last Rate Last Dose  . acetaminophen (TYLENOL) tablet 650 mg  650 mg Oral Q6H PRN [name], [name], MD      . alum & mag hydroxide-simeth (MAALOX/MYLANTA) 200-200-20 MG/5ML suspension 30 mL  30 mL Oral Q4H PRN [name], [name], MD      .  cloZAPine (CLOZARIL) tablet 300 mg  300 mg Oral QHS [name], [name], MD   300 mg at 03/30/18 2123  . fluvoxaMINE (LUVOX) tablet 50 mg  50 mg Oral QHS Esdras Delair B, MD   50 mg at 03/30/18 2124  . magnesium hydroxide (MILK OF MAGNESIA) suspension 30 mL  30 mL Oral Daily PRN [name], [name], MD   30 mL at 03/17/18 0530  . metFORMIN (GLUCOPHAGE) tablet 500 mg  500 mg Oral Q breakfast [name], [name], MD   500 mg at 03/31/18 1610  . metoprolol tartrate (LOPRESSOR) tablet 12.5 mg  12.5 mg Oral BID [name], [name], MD   12.5 mg at 03/31/18 0807  . perphenazine (TRILAFON) tablet 8 mg  8 mg Oral BID Ilah Boule B, MD   8 mg at 03/31/18 0807  . traZODone (DESYREL) tablet 100 mg  100 mg Oral QHS [name], [name], MD   100 mg at 03/30/18 2124    Lab Results: No results found for this or any previous visit (from the past 48 hour(s)).  Blood  Alcohol level:  Lab Results  Component Value Date   ETH <10 03/06/2018   ETH <10 12/30/2017    Metabolic Disorder Labs: Lab Results  Component Value Date   HGBA1C 4.9 03/08/2018   MPG 93.93 03/08/2018   MPG 85.32 12/30/2017   No results found for: PROLACTIN Lab Results  Component Value Date   CHOL 238 (H) 03/08/2018   TRIG 69 03/08/2018   HDL 56 03/08/2018   CHOLHDL 4.3 03/08/2018   VLDL 14 03/08/2018   LDLCALC 168 (H) 03/08/2018   LDLCALC 97 12/30/2017    Physical Findings: AIMS: Facial and Oral Movements Muscles of Facial Expression: None, normal Lips and Perioral Area: None, normal Jaw: None, normal Tongue: None, normal,Extremity Movements Upper (arms, wrists, hands, fingers): None, normal Lower (legs, knees, ankles, toes): None, normal, Trunk Movements Neck, shoulders, hips: None, normal, Overall Severity Severity of abnormal movements (highest score from questions above): None, normal Incapacitation due to abnormal movements: None, normal Patient's awareness of abnormal movements (rate only patient's report): No Awareness, Dental Status Current problems with teeth and/or dentures?: No Does patient usually wear dentures?: No  CIWA:    COWS:     Musculoskeletal: Strength & Muscle Tone: within normal limits Gait & Station: normal Patient leans: N/A  Psychiatric Specialty Exam: Physical Exam  Nursing note and vitals reviewed. Psychiatric: His affect is blunt. His speech is delayed. He is slowed, withdrawn and actively hallucinating. Thought content is paranoid and delusional. Cognition and memory are impaired. He expresses impulsivity.    Review of Systems  Neurological: Negative.   Psychiatric/Behavioral: Positive for hallucinations.  All other systems reviewed and are negative.   Blood pressure (!) 130/95, pulse 96, temperature 98 F (36.7 C), temperature source Oral, resp. rate 18, height 6' 0.44" (1.84 m), weight 86.2 kg, SpO2 100 %.Body mass index is  25.46 kg/m.  General Appearance: Casual  Eye Contact:  Absent  Speech:  Slow  Volume:  Decreased  Mood:  Euthymic  Affect:  Blunt  Thought Process:  Linear  Orientation:  Full (Time, Place, and Person)  Thought Content:  Delusions, Hallucinations: Auditory and Paranoid Ideation  Suicidal Thoughts:  No  Homicidal Thoughts:  No  Memory:  Immediate;   Fair Recent;   Fair Remote;   Fair  Judgement:  Poor  Insight:  Lacking  Psychomotor Activity:  Psychomotor Retardation  Concentration:  Concentration: Fair and Attention Span:  Fair  Recall:  Jennelle Human of Knowledge:  Fair  Language:  Poor  Akathisia:  No  Handed:  Right  AIMS (if indicated):     Assets:  Communication Skills Desire for Improvement Financial Resources/Insurance Housing Physical Health Resilience Social Support  ADL's:  Intact  Cognition:  WNL  Sleep:  Number of Hours: 7     Treatment Plan Summary: Daily contact with patient to assess and evaluate symptoms and progress in treatment and Medication management   Mr. Guidroz is a 22 year old male with a history of schizophrenia admitted for another psychotic break in the context of treatment noncompliance.The patient is still floridly psychotic andbizarre. He appears to be compliant with medications but his progress has been very slow. The patient has not been able to participate in discharge planningor programming.He was referred to [facility].   #Psychosis, not improving -continueClozapine 300 mg nightly, Clo level213 only on 8/30, recheck Clo level -continueLuvox 50 mg nightly for augmentation, started on 9/3 -discontinueDepakote, VPA level today 99, ammonia 57 -increaseTrilafonto 8 mg BIDfor psychosis -Trazodone 150 mg nightly  #Metabolic syndrome prevention -Metformin 500 mg BID  #Tachycardia, likely from Clozapine, resolved with treatment -Metoprolol 12.5 BID  #Labs were obtainedrecently  #Social -patient would  benefit from help of the guardian -patient, in my opinion, should be considered disabled  #Disposition -discharge to home with the mother -needs family meeting -[facility] teamaccepted the patient   Kristine Linea, MD 03/31/2018, 4:18 PM

## 2018-03-31 NOTE — [facility] Group Notes (Signed)
[facility] Group Notes:  (Nursing/MHT/Case Management/Adjunct)  Date:  03/31/2018  Time:  10:54 PM  Type of Therapy:  Group Therapy  Participation Level:  Did Not Attend    [name] 03/31/2018, 10:54 PM

## 2018-03-31 NOTE — Plan of Care (Signed)
Patient has the ability to state activities that will reduce his stress level, however, he has not stated anything to this writer at this time. Patient has not participated in any unit activities today, however, he does come out of his room for meals and then isolates himself. Patient denies SI/HI/AVH as well as any signs/symptoms of depression and anxiety. Patient verbalizes understanding of his prescribed therapeutic regimen and has not voiced any further questions/concerns at this time. Patient has the ability to redirect his anger and frustrations into appropriate behaviors and will participate in self-care when it is appropriate for him. Patient's goal for today was to "have a good day". Patient remains safe on the unit at this time.  Problem: Education: Goal: Ability to state activities that reduce stress will improve Outcome: Progressing   Problem: Coping: Goal: Ability to identify and develop effective coping behavior will improve Outcome: Progressing   Problem: Self-Concept: Goal: Ability to identify factors that promote anxiety will improve Outcome: Progressing Goal: Level of anxiety will decrease Outcome: Progressing Goal: Ability to modify response to factors that promote anxiety will improve Outcome: Progressing   Problem: Activity: Goal: Will verbalize the importance of balancing activity with adequate rest periods Outcome: Progressing   Problem: Education: Goal: Will be free of psychotic symptoms Outcome: Progressing Goal: Knowledge of the prescribed therapeutic regimen will improve Outcome: Progressing   Problem: Coping: Goal: Coping ability will improve Outcome: Progressing Goal: Will verbalize feelings Outcome: Progressing   Problem: Health Behavior/Discharge Planning: Goal: Compliance with prescribed medication regimen will improve Outcome: Progressing   Problem: Nutritional: Goal: Ability to achieve adequate nutritional intake will improve Outcome:  Progressing   Problem: Role Relationship: Goal: Ability to communicate needs accurately will improve Outcome: Progressing Goal: Ability to interact with others will improve Outcome: Progressing   Problem: Safety: Goal: Ability to redirect hostility and anger into socially appropriate behaviors will improve Outcome: Progressing Goal: Ability to remain free from injury will improve Outcome: Progressing   Problem: Self-Care: Goal: Ability to participate in self-care as condition permits will improve Outcome: Progressing   Problem: Self-Concept: Goal: Will verbalize positive feelings about self Outcome: Progressing

## 2018-03-31 NOTE — Progress Notes (Signed)
Recreation Therapy Notes  Date: 03/30/2018  Time: 9:30 pm   Location: Craft Room   Behavioral response: N/A   Intervention Topic: Problem Solving  Discussion/Intervention: Patient did not attend group.   Clinical Observations/Feedback:  Patient did not attend group.   Ryker Pherigo LRT/CTRS        Anna Livers 03/31/2018 11:28 AM

## 2018-03-31 NOTE — Progress Notes (Signed)
D- Patient alert and oriented. Patient presents in a pleasant mood on assessment stating that he slept good last night and had no major complaints to voice at this time. Patient denies SI, HI, AVH, and pain at this time. Patient denies any signs/symptoms of depression and anxiety stating to this writer "no, I don't think so". Patient's goal at this time is to "have a good day".  A- Scheduled medications administered to patient, per MD orders. Support and encouragement provided.  Routine safety checks conducted every 15 minutes.  Patient informed to notify staff with problems or concerns.  R- No adverse drug reactions noted. Patient contracts for safety at this time. Patient compliant with medications and treatment plan. Patient receptive, calm, and cooperative. Patient interacts well with others on the unit.  Patient remains safe at this time.

## 2018-03-31 NOTE — [facility] Group Notes (Signed)

## 2018-03-31 NOTE — Progress Notes (Signed)
[facility] MD Progress Note  04/01/2018 2:51 PM [name] [name]  MRN:  [id number]  Subjective:   Mr. Shaneyfelt reports no symptoms. He is doing "fine". He is pleasant and polite, a littly more engaging. Still keeps his eyes away. His mother is to visit today.   Principal Problem: Schizoaffective disorder, bipolar type (HCC) Diagnosis:   Patient Active Problem List   Diagnosis Date Noted  . Schizoaffective disorder, bipolar type (HCC) [F25.0] 12/30/2017    Priority: High  . Hypertension [I10] 03/06/2018  . Noncompliance [Z91.19] 12/30/2017  . Aggressive behavior of adolescent [R46.89] 10/24/2015   Total Time spent with patient: 15 minutes  Past Psychiatric History: schizophrenia  Past Medical History:  Past Medical History:  Diagnosis Date  . Asthma   . Depression   . Psychosis [facility])     Past Surgical History:  Procedure Laterality Date  . BACK SURGERY     Family History: History reviewed. No pertinent family history. Family Psychiatric  History: none Social History:  Social History   Substance and Sexual Activity  Alcohol Use No     Social History   Substance and Sexual Activity  Drug Use Yes  . Types: Marijuana    Social History   Socioeconomic History  . Marital status: Single    Spouse name: Not on file  . Number of children: Not on file  . Years of education: Not on file  . Highest education level: Not on file  Occupational History  . Not on file  Social Needs  . Financial resource strain: Not on file  . Food insecurity:    Worry: Not on file    Inability: Not on file  . Transportation needs:    Medical: Not on file    Non-medical: Not on file  Tobacco Use  . Smoking status: Never Smoker  . Smokeless tobacco: Never Used  Substance and Sexual Activity  . Alcohol use: No  . Drug use: Yes    Types: Marijuana  . Sexual activity: Not Currently  Lifestyle  . Physical activity:    Days per week: Not on file    Minutes per session: Not on file  .  Stress: Not on file  Relationships  . Social connections:    Talks on phone: Not on file    Gets together: Not on file    Attends religious service: Not on file    Active member of club or organization: Not on file    Attends meetings of clubs or organizations: Not on file    Relationship status: Not on file  Other Topics Concern  . Not on file  Social History Narrative  . Not on file   Additional Social History:                         Sleep: Fair  Appetite:  Fair  Current Medications: Current Facility-Administered Medications  Medication Dose Route Frequency Provider Last Rate Last Dose  . acetaminophen (TYLENOL) tablet 650 mg  650 mg Oral Q6H PRN [name], [name], MD      . alum & mag hydroxide-simeth (MAALOX/MYLANTA) 200-200-20 MG/5ML suspension 30 mL  30 mL Oral Q4H PRN [name], [name], MD      . cloZAPine (CLOZARIL) tablet 300 mg  300 mg Oral QHS [name], [name], MD   300 mg at 03/31/18 2157  . fluvoxaMINE (LUVOX) tablet 50 mg  50 mg Oral QHS Kambrea Carrasco B, MD   50  mg at 03/31/18 2157  . magnesium hydroxide (MILK OF MAGNESIA) suspension 30 mL  30 mL Oral Daily PRN [name], [name], MD   30 mL at 03/17/18 0530  . metFORMIN (GLUCOPHAGE) tablet 500 mg  500 mg Oral Q breakfast [name], [name], MD   500 mg at 04/01/18 0756  . metoprolol tartrate (LOPRESSOR) tablet 12.5 mg  12.5 mg Oral BID [name], [name], MD   12.5 mg at 04/01/18 0756  . perphenazine (TRILAFON) tablet 8 mg  8 mg Oral BID Jasara Corrigan B, MD   8 mg at 04/01/18 0756  . traZODone (DESYREL) tablet 100 mg  100 mg Oral QHS [name], [name], MD   100 mg at 03/31/18 2157    Lab Results: No results found for this or any previous visit (from the past 48 hour(s)).  Blood Alcohol level:  Lab Results  Component Value Date   ETH <10 03/06/2018   ETH <10 12/30/2017    Metabolic Disorder Labs: Lab Results  Component Value Date   HGBA1C 4.9 03/08/2018   MPG 93.93 03/08/2018   MPG 85.32  12/30/2017   No results found for: PROLACTIN Lab Results  Component Value Date   CHOL 238 (H) 03/08/2018   TRIG 69 03/08/2018   HDL 56 03/08/2018   CHOLHDL 4.3 03/08/2018   VLDL 14 03/08/2018   LDLCALC 168 (H) 03/08/2018   LDLCALC 97 12/30/2017    Physical Findings: AIMS: Facial and Oral Movements Muscles of Facial Expression: None, normal Lips and Perioral Area: None, normal Jaw: None, normal Tongue: None, normal,Extremity Movements Upper (arms, wrists, hands, fingers): None, normal Lower (legs, knees, ankles, toes): None, normal, Trunk Movements Neck, shoulders, hips: None, normal, Overall Severity Severity of abnormal movements (highest score from questions above): None, normal Incapacitation due to abnormal movements: None, normal Patient's awareness of abnormal movements (rate only patient's report): No Awareness, Dental Status Current problems with teeth and/or dentures?: No Does patient usually wear dentures?: No  CIWA:    COWS:     Musculoskeletal: Strength & Muscle Tone: within normal limits Gait & Station: normal Patient leans: N/A  Psychiatric Specialty Exam: Physical Exam  Vitals reviewed. Psychiatric: His speech is normal. His affect is blunt. He is withdrawn and actively hallucinating. Thought content is paranoid. Cognition and memory are normal. He expresses impulsivity.    Review of Systems  Neurological: Negative.   Psychiatric/Behavioral: Positive for hallucinations.  All other systems reviewed and are negative.   Blood pressure 102/77, pulse 91, temperature 98.2 F (36.8 C), temperature source Oral, resp. rate 16, height 6' 0.44" (1.84 m), weight 86.2 kg, SpO2 100 %.Body mass index is 25.46 kg/m.  General Appearance: Casual  Eye Contact:  Absent  Speech:  Clear and Coherent  Volume:  Normal  Mood:  Euthymic  Affect:  Appropriate  Thought Process:  Goal Directed and Descriptions of Associations: Intact  Orientation:  Full (Time, Place, and  Person)  Thought Content:  Hallucinations: Auditory  Suicidal Thoughts:  No  Homicidal Thoughts:  No  Memory:  Immediate;   Fair Recent;   Fair Remote;   Fair  Judgement:  Poor  Insight:  Lacking  Psychomotor Activity:  Psychomotor Retardation  Concentration:  Concentration: Fair and Attention Span: Fair  Recall:  [employer] of Knowledge:  Fair  Language:  Fair  Akathisia:  No  Handed:  Right  AIMS (if indicated):     Assets:  Communication Skills Desire for Improvement Financial Resources/Insurance Housing Physical Health  Resilience Social Support  ADL's:  Intact  Cognition:  WNL  Sleep:  Number of Hours: 5.45     Treatment Plan Summary: Daily contact with patient to assess and evaluate symptoms and progress in treatment and Medication management   Mr. Mangieri is a 22 year old male with a history of schizophrenia admitted for another psychotic break in the context of treatment noncompliance.The patient is still floridly psychotic andbizarre. He appears to be compliant with medications but his progress has been very slow. The patient has not been able to participate in discharge planningor programming.He was referred to [facility]   #Psychosis, not improving -continueClozapine 300 mg nightly, Clo level213 only on 8/30, recheck Clo level -continueLuvox 50 mg nightly for augmentation, started on 9/3 -discontinueDepakote, VPA level today 99, ammonia 57 -increaseTrilafonto 8 mg BIDfor psychosis -Trazodone 150 mg nightly  #Metabolic syndrome prevention -Metformin 500 mg BID  #Tachycardia, likely from Clozapine, resolved with treatment -Metoprolol 12.5 BID  #Labs were obtainedrecently  #Social -patient would benefit from help of the guardian -patient, in my opinion, should be considered disabled  #Disposition -discharge to home with the mother -needs family meeting -[facility] teamaccepted the patient  Kristine Linea,  MD 04/01/2018, 2:51 PM

## 2018-03-31 NOTE — Plan of Care (Signed)
  Problem: Education: Goal: Ability to state activities that reduce stress will improve Outcome: Progressing   Problem: Coping: Goal: Ability to identify and develop effective coping behavior will improve Outcome: Progressing   Problem: Self-Concept: Goal: Ability to identify factors that promote anxiety will improve Outcome: Progressing Goal: Level of anxiety will decrease Outcome: Progressing Goal: Ability to modify response to factors that promote anxiety will improve Outcome: Progressing   Problem: Education: Goal: Will be free of psychotic symptoms Outcome: Progressing Goal: Knowledge of the prescribed therapeutic regimen will improve Outcome: Progressing   Problem: Coping: Goal: Coping ability will improve Outcome: Progressing Goal: Will verbalize feelings Outcome: Progressing   Problem: Health Behavior/Discharge Planning: Goal: Compliance with prescribed medication regimen will improve Outcome: Progressing   Problem: Nutritional: Goal: Ability to achieve adequate nutritional intake will improve Outcome: Progressing   Problem: Role Relationship: Goal: Ability to communicate needs accurately will improve Outcome: Progressing   Problem: Safety: Goal: Ability to redirect hostility and anger into socially appropriate behaviors will improve Outcome: Progressing Goal: Ability to remain free from injury will improve Outcome: Progressing   Problem: Self-Care: Goal: Ability to participate in self-care as condition permits will improve Outcome: Progressing   Problem: Self-Concept: Goal: Will verbalize positive feelings about self Outcome: Progressing

## 2018-04-01 LAB — CLOZAPINE (CLOZARIL)
CLOZAPINE LVL: 374 ng/mL (ref 350–650)
NorClozapine: 164 ng/mL
Total(Cloz+Norcloz): 538 ng/mL

## 2018-04-01 MED ORDER — LORAZEPAM 0.5 MG PO TABS
0.5000 mg | ORAL_TABLET | Freq: Two times a day (BID) | ORAL | Status: DC
  Administered 2018-04-01 – 2018-04-02 (×3): 0.5 mg via ORAL
  Filled 2018-04-01 (×3): qty 1

## 2018-04-01 NOTE — Progress Notes (Addendum)
Nursing note 7p-7a  Pt was not observed interacting with peers on unit this shift, pt continues to isolate in room majority of this shift. Pt displayed a flat affect and mood upon interaction with this Clinical research associate. Pt denies pain ,denies SI/HI, and also denies any audio or visual hallucinations at this time. Pt continues to avoid eye contact with peers and staff. Pt out of room and was able to come to the med room for administration. When outside the med room pt turned his back to his peers to avoid looking at them. Pt remains compliant with his medication while on the unit and speaks politely to staff.  Pt is able to verbally contract for safety with this RN. Pt is now resting in bed with eyes closed, with no signs or symptoms of pain or distress noted. Pt continues to remain safe on the unit and is observed by rounding every 15 min. RN will continue to monitor.

## 2018-04-01 NOTE — Tx Team (Signed)
Interdisciplinary Treatment and Diagnostic Plan Update  04/01/2018 Time of Session: 8:30am [name] [name] MRN: [id number]  Principal Diagnosis: Schizoaffective disorder, bipolar type (HCC)  Secondary Diagnoses: Principal Problem:   Schizoaffective disorder, bipolar type (HCC) Active Problems:   Noncompliance   Hypertension   Current Medications:  Current Facility-Administered Medications  Medication Dose Route Frequency Provider Last Rate Last Dose  . acetaminophen (TYLENOL) tablet 650 mg  650 mg Oral Q6H PRN [name], [name], MD      . alum & mag hydroxide-simeth (MAALOX/MYLANTA) 200-200-20 MG/5ML suspension 30 mL  30 mL Oral Q4H PRN [name], [name], MD      . cloZAPine (CLOZARIL) tablet 300 mg  300 mg Oral QHS [name], [name], MD   300 mg at 03/31/18 2157  . fluvoxaMINE (LUVOX) tablet 50 mg  50 mg Oral QHS [name], [name], MD   50 mg at 03/31/18 2157  . magnesium hydroxide (MILK OF MAGNESIA) suspension 30 mL  30 mL Oral Daily PRN [name], [name], MD   30 mL at 03/17/18 0530  . metFORMIN (GLUCOPHAGE) tablet 500 mg  500 mg Oral Q breakfast [name], [name], MD   500 mg at 04/01/18 0756  . metoprolol tartrate (LOPRESSOR) tablet 12.5 mg  12.5 mg Oral BID [name], [name], MD   12.5 mg at 04/01/18 0756  . perphenazine (TRILAFON) tablet 8 mg  8 mg Oral BID [name], [name], MD   8 mg at 04/01/18 0756  . traZODone (DESYREL) tablet 100 mg  100 mg Oral QHS [name], [name], MD   100 mg at 03/31/18 2157   PTA Medications: Medications Prior to Admission  Medication Sig Dispense Refill Last Dose  . cloZAPine (CLOZARIL) 100 MG tablet Take 3 tablets (300 mg total) by mouth at bedtime. 90 tablet 1` Unknown at Unknown  . divalproex (DEPAKOTE) 500 MG DR tablet Take 1 tablet (500 mg total) by mouth every 12 (twelve) hours. 60 tablet 1 Unknown at Unknown  . hydrOXYzine (ATARAX/VISTARIL) 50 MG tablet Take 1 tablet (50 mg total) by mouth 3 (three) times daily as needed for anxiety. 90 tablet 1  Unknown at Unknown  . metFORMIN (GLUCOPHAGE) 500 MG tablet Take 1 tablet (500 mg total) by mouth daily with breakfast. 30 tablet 1 Unknown at Unknown  . metoprolol tartrate (LOPRESSOR) 25 MG tablet Take 0.5 tablets (12.5 mg total) by mouth 2 (two) times daily. 60 tablet 1 Unknown at Unknown  . traZODone (DESYREL) 100 MG tablet Take 1 tablet (100 mg total) by mouth at bedtime as needed for sleep. 30 tablet 1 Unknown at Unknown    Patient Stressors: Financial difficulties Health problems  Patient Strengths: Average or above average intelligence Supportive family/friends  Treatment Modalities: Medication Management, Group therapy, Case management,  1 to 1 session with clinician, Psychoeducation, Recreational therapy.   Physician Treatment Plan for Primary Diagnosis: Schizoaffective disorder, bipolar type (HCC) Long Term Goal(s): Improvement in symptoms so as ready for discharge Improvement in symptoms so as ready for discharge   Short Term Goals: Ability to identify changes in lifestyle to reduce recurrence of condition will improve Ability to verbalize feelings will improve Ability to disclose and discuss suicidal ideas Ability to demonstrate self-control will improve Ability to identify and develop effective coping behaviors will improve Ability to maintain clinical measurements within normal limits will improve Compliance with prescribed medications will improve Ability to identify triggers associated with substance abuse/mental health issues will improve Ability to identify changes in lifestyle to  reduce recurrence of condition will improve Ability to verbalize feelings will improve Ability to disclose and discuss suicidal ideas Ability to demonstrate self-control will improve Ability to identify and develop effective coping behaviors will improve Ability to maintain clinical measurements within normal limits will improve Compliance with prescribed medications will improve Ability  to identify triggers associated with substance abuse/mental health issues will improve  Medication Management: Evaluate patient's response, side effects, and tolerance of medication regimen.  Therapeutic Interventions: 1 to 1 sessions, Unit Group sessions and Medication administration.  Evaluation of Outcomes: Progressing  Physician Treatment Plan for Secondary Diagnosis: Principal Problem:   Schizoaffective disorder, bipolar type (HCC) Active Problems:   Noncompliance   Hypertension  Long Term Goal(s): Improvement in symptoms so as ready for discharge Improvement in symptoms so as ready for discharge   Short Term Goals: Ability to identify changes in lifestyle to reduce recurrence of condition will improve Ability to verbalize feelings will improve Ability to disclose and discuss suicidal ideas Ability to demonstrate self-control will improve Ability to identify and develop effective coping behaviors will improve Ability to maintain clinical measurements within normal limits will improve Compliance with prescribed medications will improve Ability to identify triggers associated with substance abuse/mental health issues will improve Ability to identify changes in lifestyle to reduce recurrence of condition will improve Ability to verbalize feelings will improve Ability to disclose and discuss suicidal ideas Ability to demonstrate self-control will improve Ability to identify and develop effective coping behaviors will improve Ability to maintain clinical measurements within normal limits will improve Compliance with prescribed medications will improve Ability to identify triggers associated with substance abuse/mental health issues will improve     Medication Management: Evaluate patient's response, side effects, and tolerance of medication regimen.  Therapeutic Interventions: 1 to 1 sessions, Unit Group sessions and Medication administration.  Evaluation of Outcomes:  Progressing   RN Treatment Plan for Primary Diagnosis: Schizoaffective disorder, bipolar type (HCC) Long Term Goal(s): Knowledge of disease and therapeutic regimen to maintain health will improve  Short Term Goals: Ability to remain free from injury will improve, Ability to demonstrate self-control, Ability to identify and develop effective coping behaviors will improve and Compliance with prescribed medications will improve  Medication Management: RN will administer medications as ordered by provider, will assess and evaluate patient's response and provide education to patient for prescribed medication. RN will report any adverse and/or side effects to prescribing provider.  Therapeutic Interventions: 1 on 1 counseling sessions, Psychoeducation, Medication administration, Evaluate responses to treatment, Monitor vital signs and CBGs as ordered, Perform/monitor CIWA, COWS, AIMS and Fall Risk screenings as ordered, Perform wound care treatments as ordered.  Evaluation of Outcomes: Progressing   LCSW Treatment Plan for Primary Diagnosis: Schizoaffective disorder, bipolar type (HCC) Long Term Goal(s): Safe transition to appropriate next level of care at discharge, Engage patient in therapeutic group addressing interpersonal concerns.  Short Term Goals: Engage patient in aftercare planning with referrals and resources, Increase social support, Identify triggers associated with mental health/substance abuse issues and Increase skills for wellness and recovery  Therapeutic Interventions: Assess for all discharge needs, 1 to 1 time with Social worker, Explore available resources and support systems, Assess for adequacy in community support network, Educate family and significant other(s) on suicide prevention, Complete Psychosocial Assessment, Interpersonal group therapy.  Evaluation of Outcomes: Progressing   Progress in Treatment: Attending groups: No. Participating in groups: No. Taking  medication as prescribed: Yes. Toleration medication: Yes. Family/Significant other contact made: No, will contact:  Patient  refused Patient understands diagnosis: Yes. Discussing patient identified problems/goals with staff: Yes. Medical problems stabilized or resolved: Yes. Denies suicidal/homicidal ideation: Yes. Issues/concerns per patient self-inventory: No. Other:   New problem(s) identified: No, Describe:  None  New Short Term/Long Term Goal(s): "To get back on medications and get better."  Patient Goals:  "To get back on medications and get better."  Discharge Plan or Barriers: To return back to his mothers house and follow up with [facility] Team.   Reason for Continuation of Hospitalization: Medication stabilization  Estimated Length of Stay: 2-4 days     Attendees: Patient:  03/27/2018   Physician: Dr. Jennet Maduro, MD 03/27/2018   Nursing: Hulan Amato, RN 03/27/2018   RN Care Manager: 03/27/2018   Social Worker: [name], LCSWA 03/27/2018   Recreational Therapist: Danella Deis. Outlaw CTRS, LRT 03/27/2018   Other: Jake Shark, LCSW 03/27/2018   Other:  03/27/2018   Other: 03/27/2018        Scribe for Treatment Team: [name], LCSW 04/01/2018 11:24 AM

## 2018-04-01 NOTE — Progress Notes (Signed)
Recreation Therapy Notes   Date: 04/01/2018  Time: 9:30 pm   Location: Craft Room   Behavioral response: N/A   Intervention Topic: Stress  Discussion/Intervention: Patient did not attend group.   Clinical Observations/Feedback:  Patient did not attend group.   Aundra Pung LRT/CTRS         Gessica Jawad 04/01/2018 11:47 AM

## 2018-04-01 NOTE — Progress Notes (Signed)
[facility] MD Progress Note  04/02/2018 9:05 AM Acie JAZMON KIBBY  MRN:  [id number]  Subjective:   Mr. Denger denies any symptoms of depression, anxiety, or psychosis. He is out of his room this morning. He no longer wears scrubs. He still closes his eyes talking to me but no longer turns away. He takes medications and tolerates them well. No somatic complaints.  His mother visited last night and apparently the visit went well. The patient believes that she could return home today. I attempted to call his mother several times this morning. Both numbers I have do not even have a voicemail box. Mohamed wants to go home very much and can not be trusted until we talk to her mother.    Principal Problem: Undifferentiated schizophrenia (HCC) Diagnosis:   Patient Active Problem List   Diagnosis Date Noted  . Undifferentiated schizophrenia (HCC) [F20.3] 12/30/2017    Priority: High  . Hypertension [I10] 03/06/2018  . Noncompliance [Z91.19] 12/30/2017  . Aggressive behavior of adolescent [R46.89] 10/24/2015   Total Time spent with patient: 15 minutes  Past Psychiatric History: schizophrenia  Past Medical History:  Past Medical History:  Diagnosis Date  . Asthma   . Depression   . Psychosis [facility]     Past Surgical History:  Procedure Laterality Date  . BACK SURGERY     Family History: History reviewed. No pertinent family history. Family Psychiatric  History: none Social History:  Social History   Substance and Sexual Activity  Alcohol Use No     Social History   Substance and Sexual Activity  Drug Use Yes  . Types: Marijuana    Social History   Socioeconomic History  . Marital status: Single    Spouse name: Not on file  . Number of children: Not on file  . Years of education: Not on file  . Highest education level: Not on file  Occupational History  . Not on file  Social Needs  . Financial resource strain: Not on file  . Food insecurity:    Worry: Not on file     Inability: Not on file  . Transportation needs:    Medical: Not on file    Non-medical: Not on file  Tobacco Use  . Smoking status: Never Smoker  . Smokeless tobacco: Never Used  Substance and Sexual Activity  . Alcohol use: No  . Drug use: Yes    Types: Marijuana  . Sexual activity: Not Currently  Lifestyle  . Physical activity:    Days per week: Not on file    Minutes per session: Not on file  . Stress: Not on file  Relationships  . Social connections:    Talks on phone: Not on file    Gets together: Not on file    Attends religious service: Not on file    Active member of club or organization: Not on file    Attends meetings of clubs or organizations: Not on file    Relationship status: Not on file  Other Topics Concern  . Not on file  Social History Narrative  . Not on file   Additional Social History:                         Sleep: Fair  Appetite:  Fair  Current Medications: Current Facility-Administered Medications  Medication Dose Route Frequency Provider Last Rate Last Dose  . acetaminophen (TYLENOL) tablet 650 mg  650 mg Oral Q6H PRN [name], [name]  T, MD      . alum & mag hydroxide-simeth (MAALOX/MYLANTA) 200-200-20 MG/5ML suspension 30 mL  30 mL Oral Q4H PRN [name], [name], MD      . cloZAPine (CLOZARIL) tablet 300 mg  300 mg Oral QHS [name], [name], MD   300 mg at 04/01/18 2149  . fluvoxaMINE (LUVOX) tablet 50 mg  50 mg Oral QHS Haji Delaine B, MD   50 mg at 04/01/18 2149  . LORazepam (ATIVAN) tablet 0.5 mg  0.5 mg Oral BID Boluwatife Mutchler B, MD   0.5 mg at 04/02/18 0853  . magnesium hydroxide (MILK OF MAGNESIA) suspension 30 mL  30 mL Oral Daily PRN [name], [name], MD   30 mL at 03/17/18 0530  . metFORMIN (GLUCOPHAGE) tablet 500 mg  500 mg Oral Q breakfast [name], [name], MD   500 mg at 04/02/18 0853  . metoprolol tartrate (LOPRESSOR) tablet 25 mg  25 mg Oral BID Kazia Grisanti B, MD   25 mg at 04/02/18 0853  . perphenazine  (TRILAFON) tablet 8 mg  8 mg Oral BID Ethelean Colla B, MD   8 mg at 04/02/18 0853  . traZODone (DESYREL) tablet 100 mg  100 mg Oral QHS [name], [name], MD   100 mg at 04/01/18 2149    Lab Results:  No results found for this or any previous visit (from the past 48 hour(s)).  Blood Alcohol level:  Lab Results  Component Value Date   ETH <10 03/06/2018   ETH <10 12/30/2017    Metabolic Disorder Labs: Lab Results  Component Value Date   HGBA1C 4.9 03/08/2018   MPG 93.93 03/08/2018   MPG 85.32 12/30/2017   No results found for: PROLACTIN Lab Results  Component Value Date   CHOL 238 (H) 03/08/2018   TRIG 69 03/08/2018   HDL 56 03/08/2018   CHOLHDL 4.3 03/08/2018   VLDL 14 03/08/2018   LDLCALC 168 (H) 03/08/2018   LDLCALC 97 12/30/2017    Physical Findings: AIMS: Facial and Oral Movements Muscles of Facial Expression: None, normal Lips and Perioral Area: None, normal Jaw: None, normal Tongue: None, normal,Extremity Movements Upper (arms, wrists, hands, fingers): None, normal Lower (legs, knees, ankles, toes): None, normal, Trunk Movements Neck, shoulders, hips: None, normal, Overall Severity Severity of abnormal movements (highest score from questions above): None, normal Incapacitation due to abnormal movements: None, normal Patient's awareness of abnormal movements (rate only patient's report): No Awareness, Dental Status Current problems with teeth and/or dentures?: No Does patient usually wear dentures?: No  CIWA:    COWS:     Musculoskeletal: Strength & Muscle Tone: within normal limits Gait & Station: normal Patient leans: N/A  Psychiatric Specialty Exam: Physical Exam  Nursing note and vitals reviewed. Psychiatric: His affect is blunt. His speech is delayed. He is slowed, withdrawn and actively hallucinating. Thought content is paranoid. Cognition and memory are impaired. He expresses impulsivity.    Review of Systems  Neurological: Negative.    Psychiatric/Behavioral: Positive for hallucinations.  All other systems reviewed and are negative.   Blood pressure (!) 139/92, pulse (!) 116, temperature 98.2 F (36.8 C), temperature source Oral, resp. rate 16, height 6' 0.44" (1.84 m), weight 86.2 kg, SpO2 100 %.Body mass index is 25.46 kg/m.  General Appearance: Casual  Eye Contact:  Absent  Speech:  Slow  Volume:  Decreased  Mood:  Euthymic  Affect:  Flat  Thought Process:  Linear  Orientation:  Full (Time, Place, and Person)  Thought Content:  Hallucinations: Auditory Command:  not to look at others and Paranoid Ideation  Suicidal Thoughts:  No  Homicidal Thoughts:  No  Memory:  Immediate;   Fair Recent;   Fair Remote;   Fair  Judgement:  Impaired  Insight:  Shallow  Psychomotor Activity:  Decreased  Concentration:  Concentration: Poor and Attention Span: Poor  Recall:  Poor  Fund of Knowledge:  Fair  Language:  Poor  Akathisia:  No  Handed:  Right  AIMS (if indicated):     Assets:  Communication Skills Desire for Improvement Financial Resources/Insurance Housing Physical Health Resilience Social Support  ADL's:  Intact  Cognition:  WNL  Sleep:  Number of Hours: 7     Treatment Plan Summary: Daily contact with patient to assess and evaluate symptoms and progress in treatment and Medication management   Mr. Aultman is a 22 year old male with a history of schizophrenia admitted for another psychotic break in the context of treatment noncompliance.The patient is still floridly psychotic andbizarre. He appears to be compliant with medications but his progress has been very slow. The patient has not been able to participate in discharge planningor programming.He was [facility] to [facility].   #Psychosis, not improving -continueClozapine 300 mg nightly, Clo level530 on 9/9 on Luvox -continueLuvox 50 mg nightly for augmentation, started on 9/3 -discontinueDepakote, VPA level today 99, ammonia  57 -increaseTrilafonto 8 mg BIDfor psychosis -Trazodone 150 mg nightly  #Metabolic syndrome prevention -Metformin 500 mg BID  #Tachycardia, likely from Clozapine, resolved with treatment -Metoprolol 12.5 BID  #Labs were obtainedrecently  #Social -patient would benefit from help of the guardian -patient, in my opinion, should be considered disabled  #Disposition -discharge to home with the mother -needs family meeting -[facility] teamaccepted the patient   Kristine Linea, MD 04/02/2018, 9:05 AM

## 2018-04-01 NOTE — Plan of Care (Signed)
Patient has the ability to state activities that will reduce his stress level, however, he has not stated anything to this writer at this time. Patient has not participated in any unit activities today, however, patient does come out for meals and continues to isolate to his room. Patient denies SI/HI/AVH as well as any signs/symptoms of depression and anxiety stating "no, I don't think so". Patient verbalizes understanding of his prescribed therapeutic regimen and has not voiced any further questions/concerns at this time. Patient has the ability to redirect his anger and frustrations into appropriate behaviors and will participate in self-care when it is appropriate for him. Patient had no stated goals for today. Patient remains safe on the unit at this time.  Problem: Education: Goal: Ability to state activities that reduce stress will improve Outcome: Progressing   Problem: Coping: Goal: Ability to identify and develop effective coping behavior will improve Outcome: Progressing   Problem: Self-Concept: Goal: Ability to identify factors that promote anxiety will improve Outcome: Progressing Goal: Level of anxiety will decrease Outcome: Progressing Goal: Ability to modify response to factors that promote anxiety will improve Outcome: Progressing   Problem: Activity: Goal: Will verbalize the importance of balancing activity with adequate rest periods Outcome: Progressing   Problem: Education: Goal: Will be free of psychotic symptoms Outcome: Progressing Goal: Knowledge of the prescribed therapeutic regimen will improve Outcome: Progressing   Problem: Coping: Goal: Coping ability will improve Outcome: Progressing Goal: Will verbalize feelings Outcome: Progressing   Problem: Health Behavior/Discharge Planning: Goal: Compliance with prescribed medication regimen will improve Outcome: Progressing   Problem: Nutritional: Goal: Ability to achieve adequate nutritional intake will  improve Outcome: Progressing   Problem: Role Relationship: Goal: Ability to communicate needs accurately will improve Outcome: Progressing Goal: Ability to interact with others will improve Outcome: Progressing   Problem: Safety: Goal: Ability to redirect hostility and anger into socially appropriate behaviors will improve Outcome: Progressing Goal: Ability to remain free from injury will improve Outcome: Progressing   Problem: Self-Care: Goal: Ability to participate in self-care as condition permits will improve Outcome: Progressing   Problem: Self-Concept: Goal: Will verbalize positive feelings about self Outcome: Progressing

## 2018-04-01 NOTE — Progress Notes (Signed)
D- Patient alert and oriented. Patient presents in a pleasant mood on assessment stating that he slept well last night and had no major complaints to voice to this writer at this time. Patient denies SI, HI, AVH, and pain at this time stating "no, I don't think so". Patient also denies any signs/symptoms of depression and anxiety. Patient has no stated goals for today "no, not really".   A- Scheduled medications administered to patient, per MD orders. Support and encouragement provided.  Routine safety checks conducted every 15 minutes.  Patient informed to notify staff with problems or concerns.  R- No adverse drug reactions noted. Patient contracts for safety at this time. Patient compliant with medications and treatment plan. Patient receptive, calm, and cooperative. Patient interacts well with others on the unit.  Patient remains safe at this time.

## 2018-04-02 MED ORDER — FLUVOXAMINE MALEATE 50 MG PO TABS: 50.0000 mg | ORAL_TABLET | Freq: Every day | ORAL | 1 refills | Status: DC

## 2018-04-02 MED ORDER — METOPROLOL TARTRATE 25 MG PO TABS
25.0000 mg | ORAL_TABLET | Freq: Two times a day (BID) | ORAL | Status: DC
  Administered 2018-04-02 – 2018-04-03 (×3): 25 mg via ORAL
  Filled 2018-04-02 (×3): qty 1

## 2018-04-02 MED ORDER — PERPHENAZINE 8 MG PO TABS: 8.0000 mg | ORAL_TABLET | Freq: Two times a day (BID) | ORAL | 1 refills | Status: DC

## 2018-04-02 MED ORDER — TRAZODONE HCL 100 MG PO TABS: 100.0000 mg | ORAL_TABLET | Freq: Every evening | ORAL | 1 refills | Status: DC | PRN

## 2018-04-02 MED ORDER — LORAZEPAM 0.5 MG PO TABS: 0.5000 mg | ORAL_TABLET | Freq: Two times a day (BID) | ORAL | 1 refills | Status: DC

## 2018-04-02 MED ORDER — METOPROLOL TARTRATE 25 MG PO TABS: 12.5000 mg | ORAL_TABLET | Freq: Two times a day (BID) | ORAL | 1 refills | Status: DC

## 2018-04-02 MED ORDER — METFORMIN HCL 500 MG PO TABS: 500.0000 mg | ORAL_TABLET | Freq: Every day | ORAL | 1 refills | Status: DC

## 2018-04-02 MED ORDER — CLOZAPINE 100 MG PO TABS: 300.0000 mg | ORAL_TABLET | Freq: Every day | ORAL | Status: DC

## 2018-04-02 NOTE — Discharge Summary (Signed)
Physician Discharge Summary Note  Patient:  [name] [name] is an 22 y.o., male MRN:  [id number] DOB:  1996/05/27 Patient phone:  [phone] (home)  Patient address:   [address] [place] [address],  Total Time spent with patient: 20 minutes plus 15 min on care coordination and documentation  Date of Admission:  03/07/2018 Date of Discharge: 04/03/2018  Reason for Admission:  Psychotic break.  History of Present Illness:  "I  guess I overreacted to something, maybe I walked away from my house" pt is 22 year old man with a history of psychotic disorder admitted from ED. Pt admitted under IVC. Per report, pt was  brought to ED by police, pt  was  found  in public standing looking around dazed and confused, psychotic. Pt reportedly had thought blocking and is constantly responding to internal stimuli, and  making nonsensical answers. he has not been taking any of his psychiatric medicines since his last discharge. Denies that he has been using any drugs. Pt lying in his bed, very disorganized, poor historian, unable to tell the circumstances of his hospitalization, Denies AVH at this time, but pt noted to be responding to unseen, makes poor eye contact, blunted affect, gives slow and tangential answers. Denies SI/HI.  States he  lives with his mother and little brother. Denies that he is been using any drugs recently drug screen is still pending. BAL <10.   Associated Signs/Symptoms: Depression Symptoms:  fatigue, anxiety, (Hypo) Manic Symptoms:  Labiality of Mood, Anxiety Symptoms:   Psychotic Symptoms:  Talking to unseen, guarded PTSD Symptoms: Negative  Past Psychiatric History:  several hospitalizations , medication non compliance. Last hospitalization [facility]  for an extended stay during which time he was treated with clozapine and Depakote. Discharged about a month ago. No known suicide attempts but he has bizarre behavior the puts himself in danger when he is  psychotic.In his young life the patient has now had several hospitalizations largely I think because of not only what seems like the severity of his illness but his refusal to be compliant with medicines outside the hospital. Last time he was here in the hospital it was for an extended stay during which time he was treated with clozapine and Depakote. Discharged about a month ago. No known suicide attempts but he has bizarre behavior the puts himself in danger when he is psychotic.  Family Psychiatric  History: None.  Social History: Graduated from high school. Community college education was interrupted by his illness. Lives with his mother and younger brother.  Principal Problem: Undifferentiated schizophrenia [facility]) Discharge Diagnoses: Patient Active Problem List   Diagnosis Date Noted  . Undifferentiated schizophrenia (HCC) [F20.3] 12/30/2017    Priority: High  . Hypertension [I10] 03/06/2018  . Noncompliance [Z91.19] 12/30/2017  . Aggressive behavior of adolescent [R46.89] 10/24/2015   Past Medical History:  Past Medical History:  Diagnosis Date  . Asthma   . Depression   . Psychosis [facility])     Past Surgical History:  Procedure Laterality Date  . BACK SURGERY     Family History: History reviewed. No pertinent family history.  Social History:  Social History   Substance and Sexual Activity  Alcohol Use No     Social History   Substance and Sexual Activity  Drug Use Yes  . Types: Marijuana    Social History   Socioeconomic History  . Marital status: Single    Spouse name: Not on file  . Number of children: Not on  file  . Years of education: Not on file  . Highest education level: Not on file  Occupational History  . Not on file  Social Needs  . Financial resource strain: Not on file  . Food insecurity:    Worry: Not on file    Inability: Not on file  . Transportation needs:    Medical: Not on file    Non-medical: Not on file  Tobacco Use  . Smoking  status: Never Smoker  . Smokeless tobacco: Never Used  Substance and Sexual Activity  . Alcohol use: No  . Drug use: Yes    Types: Marijuana  . Sexual activity: Not Currently  Lifestyle  . Physical activity:    Days per week: Not on file    Minutes per session: Not on file  . Stress: Not on file  Relationships  . Social connections:    Talks on phone: Not on file    Gets together: Not on file    Attends religious service: Not on file    Active member of club or organization: Not on file    Attends meetings of clubs or organizations: Not on file    Relationship status: Not on file  Other Topics Concern  . Not on file  Social History Narrative  . Not on file    Hospital Course:    Mr. Rudman is a 22 year old male with a history of schizophrenia admitted for another psychotic break in the context of treatment noncompliance.The patient is still floridly psychotic andbizarre. He appears to be compliant with medications but his progress has been very slow. The patient has not been able to participate in discharge planningor programming.He was [facility] to [facility].   #Psychosis, not improving -continueClozapine 300 mg nightly, Clo level213 only on 8/30, recheck Clo level -continueLuvox 50 mg nightly for augmentation, started on 9/3 -discontinueDepakote, VPA level today 99, ammonia 57 -increaseTrilafonto 8 mg BIDfor psychosis -Trazodone 150 mg nightly  #Metabolic syndrome prevention -Metformin 500 mg BID  #Tachycardia, likely from Clozapine, resolved with treatment -Metoprolol 12.5 BID  #Labs were obtainedrecently  #Social -patient would benefit from help of the guardian -patient, in my opinion, should be considered disabled  #Disposition -discharge to home with the mother -needs family meeting -[facility] teamaccepted the patient   Physical Findings: AIMS: Facial and Oral Movements Muscles of Facial Expression: None,  normal Lips and Perioral Area: None, normal Jaw: None, normal Tongue: None, normal,Extremity Movements Upper (arms, wrists, hands, fingers): None, normal Lower (legs, knees, ankles, toes): None, normal, Trunk Movements Neck, shoulders, hips: None, normal, Overall Severity Severity of abnormal movements (highest score from questions above): None, normal Incapacitation due to abnormal movements: None, normal Patient's awareness of abnormal movements (rate only patient's report): No Awareness, Dental Status Current problems with teeth and/or dentures?: No Does patient usually wear dentures?: No  CIWA:    COWS:     Musculoskeletal: Strength & Muscle Tone: within normal limits Gait & Station: normal Patient leans: N/A  Psychiatric Specialty Exam: Physical Exam  Nursing note and vitals reviewed. Psychiatric: His speech is normal. Thought content normal. His affect is blunt. He is actively hallucinating. Cognition and memory are normal. He expresses impulsivity.    Review of Systems  Neurological: Negative.   Psychiatric/Behavioral: Positive for hallucinations.  All other systems reviewed and are negative.   Blood pressure 112/70, pulse 81, temperature 98.1 F (36.7 C), resp. rate 16, height 6' 0.44" (1.84 m), weight 86.2 kg, SpO2 100 %.Body  mass index is 25.46 kg/m.  General Appearance: Casual  Eye Contact:  Poor  Speech:  Clear and Coherent  Volume:  Decreased  Mood:  Euthymic  Affect:  Blunt  Thought Process:  Goal Directed and Descriptions of Associations: Intact  Orientation:  Full (Time, Place, and Person)  Thought Content:  Hallucinations: Auditory  Suicidal Thoughts:  No  Homicidal Thoughts:  No  Memory:  Immediate;   Fair Recent;   Fair Remote;   Fair  Judgement:  Impaired  Insight:  Lacking  Psychomotor Activity:  Decreased  Concentration:  Concentration: Poor and Attention Span: Poor  Recall:  Fair  Fund of Knowledge:  Fair  Language:  Fair  Akathisia:  No   Handed:  Right  AIMS (if indicated):     Assets:  Communication Skills Desire for Improvement Financial Resources/Insurance Housing Physical Health Resilience Social Support  ADL's:  Intact  Cognition:  WNL  Sleep:  Number of Hours: 7     Have you used any form of tobacco in the last 30 days? (Cigarettes, Smokeless Tobacco, Cigars, and/or Pipes): No  Has this patient used any form of tobacco in the last 30 days? (Cigarettes, Smokeless Tobacco, Cigars, and/or Pipes) Yes, No  Blood Alcohol level:  Lab Results  Component Value Date   ETH <10 03/06/2018   ETH <10 12/30/2017    Metabolic Disorder Labs:  Lab Results  Component Value Date   HGBA1C 4.9 03/08/2018   MPG 93.93 03/08/2018   MPG 85.32 12/30/2017   No results found for: PROLACTIN Lab Results  Component Value Date   CHOL 238 (H) 03/08/2018   TRIG 69 03/08/2018   HDL 56 03/08/2018   CHOLHDL 4.3 03/08/2018   VLDL 14 03/08/2018   LDLCALC 168 (H) 03/08/2018   LDLCALC 97 12/30/2017    See Psychiatric Specialty Exam and Suicide Risk Assessment completed by Attending Physician prior to discharge.  Discharge destination:  Home  Is patient on multiple antipsychotic therapies at discharge:  Yes,   Do you recommend tapering to monotherapy for antipsychotics?  No   Has Patient had three or more failed trials of antipsychotic monotherapy by history:  Yes,   Antipsychotic medications that previously failed include:   1.  zyprexa., 2.  clozapine. and 3.  haldol.  Recommended Plan for Multiple Antipsychotic Therapies: Second antipsychotic is Clozapine.  Reason for adding Clozapine inadequate response to a single agent  Discharge Instructions    Diet - low sodium heart healthy   Complete by:  As directed    Increase activity slowly   Complete by:  As directed      Allergies as of 04/03/2018   No Known Allergies     Medication List    STOP taking these medications   divalproex 500 MG DR tablet Commonly known  as:  DEPAKOTE   hydrOXYzine 50 MG tablet Commonly known as:  ATARAX/VISTARIL     TAKE these medications     Indication  cloZAPine 100 MG tablet Commonly known as:  CLOZARIL Take 3 tablets (300 mg total) by mouth at bedtime.  Indication:  Schizophrenia that does Not Respond to Usual Drug Therapy   fluvoxaMINE 50 MG tablet Commonly known as:  LUVOX Take 1 tablet (50 mg total) by mouth at bedtime.  Indication:  augmentation of Clozapine   LORazepam 0.5 MG tablet Commonly known as:  ATIVAN Take 1 tablet (0.5 mg total) by mouth 2 (two) times daily.  Indication:  Schizophrenia   metFORMIN 500 MG  tablet Commonly known as:  GLUCOPHAGE Take 1 tablet (500 mg total) by mouth daily with breakfast.  Indication:  Antipsychotic Therapy-Induced Weight Gain   metoprolol tartrate 25 MG tablet Commonly known as:  LOPRESSOR Take 0.5 tablets (12.5 mg total) by mouth 2 (two) times daily.  Indication:  Supraventricular Tachycardia   perphenazine 8 MG tablet Commonly known as:  TRILAFON Take 1 tablet (8 mg total) by mouth 2 (two) times daily.  Indication:  Schizophrenia   traZODone 100 MG tablet Commonly known as:  DESYREL Take 1 tablet (100 mg total) by mouth at bedtime as needed for sleep.  Indication:  Trouble Sleeping      Follow-up Information    [facility] [place] [place] Follow up.   Contact information: [address] [place] [address] [phone]        [facility] Follow up.   Contact information: [address] [place] [address] [phone]           Follow-up recommendations:  Activity:  as tolerated Diet:  regular Other:  keep follow up appointments  Comments:    Signed: Kristine LineaJolanta Findley Blankenbaker, MD 04/03/2018, 9:01 AM

## 2018-04-02 NOTE — Progress Notes (Signed)
Patient was more visible in the milieu. Visited with his mother and was pleased. Patient told his father that he was ready to go home with him: discharged information given to both patient and mother. Patient received his bedtime medication and did not have any other concerns. Currently in bed resting and staff continue to monitor for safety.

## 2018-04-02 NOTE — Progress Notes (Signed)
Recreation Therapy Notes  Date: 04/02/2018  Time: 9:30 pm   Location: Craft Room   Behavioral response: N/A   Intervention Topic: Creative expressions  Discussion/Intervention: Patient did not attend group.   Clinical Observations/Feedback:  Patient did not attend group.   Nyjae Hodge LRT/CTRS        Shawnice Tilmon 04/02/2018 10:56 AM

## 2018-04-02 NOTE — Plan of Care (Signed)
Patient denies SI and HI.Receptive on approach.States "the voices are not bad."Patient continues to avoid eye contact with anyone.Isolated in the room.Out of the room for meds and meals.Did not attend groups.Appetite and energy level good.Support and encouragement given.

## 2018-04-02 NOTE — BHH Suicide Risk Assessment (Addendum)
[facility]   Principal Problem: Undifferentiated schizophrenia [facility](HCC) Discharge Diagnoses:  Patient Active Problem List   Diagnosis Date Noted  . Undifferentiated schizophrenia (HCC) [F20.3] 12/30/2017    Priority: High  . Hypertension [I10] 03/06/2018  . Noncompliance [Z91.19] 12/30/2017  . Aggressive behavior of adolescent [R46.89] 10/24/2015    Total Time spent with patient: 20 minutes  Musculoskeletal: Strength & Muscle Tone: within normal limits Gait & Station: normal Patient leans: N/A  Psychiatric Specialty Exam: Review of Systems  Neurological: Negative.   Psychiatric/Behavioral: Positive for hallucinations.  All other systems reviewed and are negative.   Blood pressure 112/70, pulse 81, temperature 98.1 F (36.7 C), resp. rate 16, height 6' 0.44" (1.84 m), weight 86.2 kg, SpO2 100 %.Body mass index is 25.46 kg/m.  General Appearance: Casual  Eye Contact::  Poor  Speech:  Clear and Coherent409  Volume:  Normal  Mood:  Euthymic  Affect:  Flat  Thought Process:  Goal Directed and Descriptions of Associations: Intact  Orientation:  Full (Time, Place, and Person)  Thought Content:  Hallucinations: Auditory  Suicidal Thoughts:  No  Homicidal Thoughts:  No  Memory:  Immediate;   Fair Recent;   Fair Remote;   Fair  Judgement:  Impaired  Insight:  Lacking  Psychomotor Activity:  Decreased  Concentration:  Poor  Recall:  Poor  Fund of Knowledge:Fair  Language: Fair  Akathisia:  No  Handed:  Right  AIMS (if indicated):     Assets:  Communication Skills Desire for Improvement Financial Resources/Insurance Housing Physical Health Resilience Social Support  Sleep:  Number of Hours: 7  Cognition: WNL  ADL's:  Intact   Mental Status Per Nursing Assessment::   On Admission:  NA  Demographic Factors:  Male, Adolescent or young adult and Unemployed  Loss Factors: Decrease in vocational status and Financial problems/change in  socioeconomic status  Historical Factors: Impulsivity  Risk Reduction Factors:   Sense of responsibility to family, Religious beliefs about death, Living with another person, especially a relative and Positive social support  Continued Clinical Symptoms:  Schizophrenia:   Less than 20 years old  Cognitive Features That Contribute To Risk:  None    Suicide Risk:  Minimal: No identifiable suicidal ideation.  Patients presenting with no risk factors but with morbid ruminations; may be classified as minimal risk based on the severity of the depressive symptoms  Follow-up Information    [address] [place] [employer] Follow up.   Contact information: [address] [place] [address] [phone]        [facility] Follow up.   Contact information: [address] [place] [address] [phone]           Plan Of Care/Follow-up recommendations:  Activity:  as tolerated Diet:  regular Other:  keep follow up appointment  Kristine LineaJolanta Shaolin Armas, MD 04/03/2018, 9:00 AM

## 2018-04-02 NOTE — Plan of Care (Signed)
Thought process more organized. Out of room more often

## 2018-04-02 NOTE — BHH Group Notes (Signed)
LCSW Group Therapy Note  04/02/2018 1:00 pm  Type of Therapy/Topic:  Group Therapy:  Balance in Life  Participation Level:  Did Not Attend  Description of Group:    This group will address the concept of balance and how it feels and looks when one is unbalanced. Patients will be encouraged to process areas in their lives that are out of balance and identify reasons for remaining unbalanced. Facilitators will guide patients in utilizing problem-solving interventions to address and correct the stressor making their life unbalanced. Understanding and applying boundaries will be explored and addressed for obtaining and maintaining a balanced life. Patients will be encouraged to explore ways to assertively make their unbalanced needs known to significant others in their lives, using other group members and facilitator for support and feedback.  Therapeutic Goals: 1. Patient will identify two or more emotions or situations they have that consume much of in their lives. 2. Patient will identify signs/triggers that life has become out of balance:  3. Patient will identify two ways to set boundaries in order to achieve balance in their lives:  4. Patient will demonstrate ability to communicate their needs through discussion and/or role plays  Summary of Patient Progress:      Therapeutic Modalities:   Cognitive Behavioral Therapy Solution-Focused Therapy Assertiveness Training  Alease FrameSonya S Arhum Peeples, Alexander MtLCSW 04/02/2018 2:33 PM

## 2018-04-03 LAB — CBC WITH DIFFERENTIAL/PLATELET
BASOS ABS: 0 10*3/uL (ref 0–0.1)
BASOS PCT: 0 %
EOS ABS: 0 10*3/uL (ref 0–0.7)
Eosinophils Relative: 0 %
HCT: 41.4 % (ref 40.0–52.0)
Hemoglobin: 14.7 g/dL (ref 13.0–18.0)
Lymphocytes Relative: 43 %
Lymphs Abs: 2.1 10*3/uL (ref 1.0–3.6)
MCH: 28.5 pg (ref 26.0–34.0)
MCHC: 35.4 g/dL (ref 32.0–36.0)
MCV: 80.6 fL (ref 80.0–100.0)
Monocytes Absolute: 0.6 10*3/uL (ref 0.2–1.0)
Monocytes Relative: 12 %
Neutro Abs: 2.2 10*3/uL (ref 1.4–6.5)
Neutrophils Relative %: 45 %
PLATELETS: 194 10*3/uL (ref 150–440)
RBC: 5.14 MIL/uL (ref 4.40–5.90)
RDW: 13.1 % (ref 11.5–14.5)
WBC: 4.9 10*3/uL (ref 3.8–10.6)

## 2018-04-03 NOTE — Progress Notes (Signed)
Recreation Therapy Notes  INPATIENT RECREATION TR PLAN  Patient Details Name: STEPHANIE MCGLONE MRN: [id number] DOB: [birth year] Today's Date: 04/03/2018  Rec Therapy Plan Is patient appropriate for Therapeutic Recreation?: Yes Treatment times per week: at least 3  Estimated Length of Stay: 5-7 days TR Treatment/Interventions: Group participation (Comment)  Discharge Criteria Pt will be discharged from therapy if:: Discharged Treatment plan/goals/alternatives discussed and agreed upon by:: Patient/family  Discharge Summary Short term goals set: Patient will engage in groups without prompting or encouragement from LRT x3 group sessions within 5 recreation therapy group sessions Short term goals met: Not met Reason goals not met: Patient did not attend any groups Therapeutic equipment acquired: N/A Reason patient discharged from therapy: Discharge from hospital Pt/family agrees with progress & goals achieved: Yes Date patient discharged from therapy: 04/03/18   Kapil Petropoulos 04/03/2018, 1:31 PM

## 2018-04-03 NOTE — Progress Notes (Signed)
Patient is alert and oriented x 4, denies SI/HI/AVH not responding to internal stimuli, Patient during interaction in his room made eye contact with staff. Patient appears less anxious nor apprehensive, thoughts are organized and coherent, speech is soft and very polite with staff, affect is labile no distress noted,.15 minutes safety checks maintained will continue to monitor.

## 2018-04-03 NOTE — Plan of Care (Signed)
  Problem: Activity: Goal: Will verbalize the importance of balancing activity with adequate rest periods Outcome: Progressing  Patient noted interacting with peers and staff.

## 2018-04-03 NOTE — BHH Group Notes (Signed)
BHH Group Notes:  (Nursing/MHT/Case Management/Adjunct)  Date:  04/03/2018  Time:  1:34 AM  Type of Therapy:  Group Therapy  Participation Level:  Did Not Attend   Jinger NeighborsKeith D Wyman Meschke 04/03/2018, 1:34 AM

## 2018-04-03 NOTE — Consult Note (Signed)
[facility] LabsKaren K Hayes, RPh MEDICATION RELATED CONSULT NOTE - INITIAL   Pharmacy Consult for Clozapine REMs reporting and Lab Monitoring  Indication: Schizoaffective disorder   Labs: Recent Labs    04/03/18 0748  WBC 4.9  HGB 14.7  HCT 41.4  PLT 194   CrCl cannot be calculated (Patient's most recent lab result is older than the maximum 21 days allowed.).   Microbiology: No results found for this or any previous visit (from the past 720 hour(s)).  Medical History: Past Medical History:  Diagnosis Date  . Asthma   . Depression   . Psychosis [facility](HCC)     Assessment: Pharmacy consulted for clozapine lab monitoring and REMs Program Reporting in 22 yo male with PMH of Schizoaffective disorder. Patient prescribed Clozapine 300mg  PTA.   Plan:  09/06 ANC: 2700.  9/13  ANC 2200  Lab reported to online Clozapine REMs Program. Patient is eligible to receive clozapine with weekly lab monitoring.  Next CBC with Diff 09/20   Deondria Puryear A, [facility] 04/03/2018 11:04 AM

## 2018-04-03 NOTE — Progress Notes (Signed)
  [facility] Discharge Plan :  Will you be returning to the same living situation after discharge:  Yes,  Home with mother At discharge, do you have transportation home?: Yes,  Mother is coming at discharge Do you have the ability to pay for your medications: Yes,  Referred to ACTT Team  Release of information consent forms completed and in the chart;  Patient's signature needed at discharge.  Patient to Follow up at: Follow-up Information    [facility] [place] Go on 04/08/2018.   Why:  Please meet with your ACTT Team on Wednesday Septmeber 10, 2019 at 10am. Please bring your discharge paperwork, identification and all medicaitons that you are bringing. Thank you. Contact information: [address] [place] Phone: [phone] Fax:[phone]          Next level of care provider has access to [facility] Link:no  Safety Planning and Suicide Prevention discussed: Yes,  Completed with patient  Have you used any form of tobacco in the last 30 days? (Cigarettes, Smokeless Tobacco, Cigars, and/or Pipes): No  Has patient been referred to the Quitline?: N/A patient is not a smoker  Patient has been referred for addiction treatment: N/A  Johny ShearsCassandra  Ariea Rochin, LCSW 04/03/2018, 9:36 AM

## 2018-04-03 NOTE — Progress Notes (Signed)
   04/03/18 1055  Clinical Encounter Type  Visited With Health care provider  Visit Type Follow-up;Behavioral Health  Referral From Physician  Consult/Referral To Chaplain  Spiritual Encounters  Spiritual Needs Other (Comment)   CH inquired about [name]'s condition. I was informed that the patient is still having some difficulty making eye contact with those he speaks with. Early on in this hospital stay the patient reported that voices where telling him not to look at anyone. The patient's RN stated that earlier today he had removed his hood and talked to her face to face. He stated that this was due to the fact that he was going home. The patient is scheduled to be discharged today or tomorrow.

## 2018-04-03 NOTE — Progress Notes (Signed)
Recreation Therapy Notes  Date: 04/03/2018  Time: 9:30 pm   Location: Craft Room   Behavioral response: N/A   Intervention Topic: Leisure  Discussion/Intervention: Patient did not attend group.   Clinical Observations/Feedback:  Patient did not attend group.   Kadarius Cuffe LRT/CTRS        Raylan Troiani 04/03/2018 11:45 AM

## 2018-05-19 ENCOUNTER — Inpatient Hospital Stay
Admission: AD | Admit: 2018-05-19 | Discharge: 2018-05-25 | DRG: 885 | Disposition: A | Discharge status: Home / Self Care | Payer: Medicaid Other | Source: Intra-hospital | Attending: Psychiatry | Admitting: Psychiatry

## 2018-05-19 ENCOUNTER — Emergency Department
Admission: EM | Admit: 2018-05-19 | Discharge: 2018-05-19 | Disposition: A | Discharge status: Other Acute Inpatient Hospital | Payer: Medicaid Other | Attending: Emergency Medicine | Admitting: Emergency Medicine

## 2018-05-19 ENCOUNTER — Encounter: Payer: Self-pay | Admitting: Psychiatry

## 2018-05-19 ENCOUNTER — Other Ambulatory Visit: Payer: Self-pay

## 2018-05-19 DIAGNOSIS — G4700 Insomnia, unspecified: Secondary | ICD-10-CM | POA: Diagnosis present

## 2018-05-19 DIAGNOSIS — F259 Schizoaffective disorder, unspecified: Secondary | ICD-10-CM | POA: Insufficient documentation

## 2018-05-19 DIAGNOSIS — Z91148 Patient's other noncompliance with medication regimen for other reason: Secondary | ICD-10-CM

## 2018-05-19 DIAGNOSIS — Z7984 Long term (current) use of oral hypoglycemic drugs: Secondary | ICD-10-CM | POA: Diagnosis not present

## 2018-05-19 DIAGNOSIS — F250 Schizoaffective disorder, bipolar type: Secondary | ICD-10-CM | POA: Diagnosis present

## 2018-05-19 DIAGNOSIS — I10 Essential (primary) hypertension: Secondary | ICD-10-CM | POA: Diagnosis not present

## 2018-05-19 DIAGNOSIS — Z9119 Patient's noncompliance with other medical treatment and regimen: Secondary | ICD-10-CM

## 2018-05-19 DIAGNOSIS — Z9114 Patient's other noncompliance with medication regimen: Secondary | ICD-10-CM | POA: Diagnosis not present

## 2018-05-19 DIAGNOSIS — J45909 Unspecified asthma, uncomplicated: Secondary | ICD-10-CM | POA: Insufficient documentation

## 2018-05-19 DIAGNOSIS — Z79899 Other long term (current) drug therapy: Secondary | ICD-10-CM | POA: Insufficient documentation

## 2018-05-19 DIAGNOSIS — I471 Supraventricular tachycardia: Secondary | ICD-10-CM | POA: Diagnosis present

## 2018-05-19 DIAGNOSIS — F203 Undifferentiated schizophrenia: Secondary | ICD-10-CM

## 2018-05-19 DIAGNOSIS — Z91199 Patient's noncompliance with other medical treatment and regimen due to unspecified reason: Secondary | ICD-10-CM

## 2018-05-19 LAB — CBC
HCT: 46.9 % (ref 39.0–52.0)
Hemoglobin: 16.5 g/dL (ref 13.0–17.0)
MCH: 28.3 pg (ref 26.0–34.0)
MCHC: 35.2 g/dL (ref 30.0–36.0)
MCV: 80.4 fL (ref 80.0–100.0)
Platelets: 248 10*3/uL (ref 150–400)
RBC: 5.83 MIL/uL — ABNORMAL HIGH (ref 4.22–5.81)
RDW: 13 % (ref 11.5–15.5)
WBC: 6.3 10*3/uL (ref 4.0–10.5)
nRBC: 0 % (ref 0.0–0.2)

## 2018-05-19 LAB — COMPREHENSIVE METABOLIC PANEL
ALK PHOS: 83 U/L (ref 38–126)
ALT: 46 U/L — AB (ref 0–44)
AST: 30 U/L (ref 15–41)
Albumin: 5.1 g/dL — ABNORMAL HIGH (ref 3.5–5.0)
Anion gap: 12 (ref 5–15)
BUN: 15 mg/dL (ref 6–20)
CALCIUM: 9.6 mg/dL (ref 8.9–10.3)
CO2: 24 mmol/L (ref 22–32)
CREATININE: 0.95 mg/dL (ref 0.61–1.24)
Chloride: 105 mmol/L (ref 98–111)
Glucose, Bld: 102 mg/dL — ABNORMAL HIGH (ref 70–99)
Potassium: 3.6 mmol/L (ref 3.5–5.1)
Sodium: 141 mmol/L (ref 135–145)
Total Bilirubin: 0.8 mg/dL (ref 0.3–1.2)
Total Protein: 8.7 g/dL — ABNORMAL HIGH (ref 6.5–8.1)

## 2018-05-19 LAB — URINE DRUG SCREEN, QUALITATIVE (ARMC ONLY)
Amphetamines, Ur Screen: NOT DETECTED
BENZODIAZEPINE, UR SCRN: NOT DETECTED
Barbiturates, Ur Screen: NOT DETECTED
CANNABINOID 50 NG, UR ~~LOC~~: NOT DETECTED
Cocaine Metabolite,Ur ~~LOC~~: NOT DETECTED
MDMA (Ecstasy)Ur Screen: NOT DETECTED
Methadone Scn, Ur: NOT DETECTED
Opiate, Ur Screen: NOT DETECTED
PHENCYCLIDINE (PCP) UR S: NOT DETECTED
Tricyclic, Ur Screen: NOT DETECTED

## 2018-05-19 LAB — ACETAMINOPHEN LEVEL: Acetaminophen (Tylenol), Serum: <10 ug/mL — ABNORMAL LOW (ref 10–30)

## 2018-05-19 LAB — ETHANOL

## 2018-05-19 LAB — SALICYLATE LEVEL

## 2018-05-19 MED ORDER — MAGNESIUM HYDROXIDE 400 MG/5ML PO SUSP
30.0000 mL | Freq: Every day | ORAL | Status: DC | PRN
  Administered 2018-05-21: 30 mL via ORAL
  Filled 2018-05-19: qty 30

## 2018-05-19 MED ORDER — ACETAMINOPHEN 325 MG PO TABS
650.0000 mg | ORAL_TABLET | Freq: Four times a day (QID) | ORAL | Status: DC | PRN
  Administered 2018-05-23: 650 mg via ORAL
  Filled 2018-05-19: qty 2

## 2018-05-19 MED ORDER — CLOZAPINE 100 MG PO TABS
300.0000 mg | ORAL_TABLET | Freq: Every day | ORAL | Status: DC
  Administered 2018-05-19 – 2018-05-20 (×2): 300 mg via ORAL
  Filled 2018-05-19 (×2): qty 3

## 2018-05-19 MED ORDER — NICOTINE 21 MG/24HR TD PT24
21.0000 mg | MEDICATED_PATCH | Freq: Every day | TRANSDERMAL | Status: DC
  Administered 2018-05-20 – 2018-05-25 (×6): 21 mg via TRANSDERMAL
  Filled 2018-05-19 (×7): qty 1

## 2018-05-19 MED ORDER — ALUM & MAG HYDROXIDE-SIMETH 200-200-20 MG/5ML PO SUSP: 30.0000 mL | ORAL | Status: DC | PRN

## 2018-05-19 MED ORDER — NICOTINE 21 MG/24HR TD PT24: 21.0000 mg | MEDICATED_PATCH | Freq: Every day | TRANSDERMAL | Status: DC

## 2018-05-19 MED ORDER — TRAZODONE HCL 100 MG PO TABS
100.0000 mg | ORAL_TABLET | Freq: Every evening | ORAL | Status: DC | PRN
  Administered 2018-05-19 – 2018-05-20 (×2): 100 mg via ORAL
  Filled 2018-05-19 (×2): qty 1

## 2018-05-19 MED ORDER — METOPROLOL TARTRATE 25 MG PO TABS
12.5000 mg | ORAL_TABLET | Freq: Two times a day (BID) | ORAL | Status: DC
  Administered 2018-05-19 – 2018-05-24 (×12): 12.5 mg via ORAL
  Administered 2018-05-25: 08:00:00 via ORAL
  Filled 2018-05-19 (×13): qty 1

## 2018-05-19 MED ORDER — INFLUENZA VAC SPLIT QUAD 0.5 ML IM SUSY: 0.5000 mL | PREFILLED_SYRINGE | INTRAMUSCULAR | Status: DC

## 2018-05-19 MED ORDER — METFORMIN HCL 500 MG PO TABS
500.0000 mg | ORAL_TABLET | Freq: Every day | ORAL | Status: DC
  Administered 2018-05-20 – 2018-05-25 (×6): 500 mg via ORAL
  Filled 2018-05-19 (×6): qty 1

## 2018-05-19 MED ORDER — PERPHENAZINE 4 MG PO TABS
8.0000 mg | ORAL_TABLET | Freq: Two times a day (BID) | ORAL | Status: DC
  Administered 2018-05-19 – 2018-05-21 (×5): 8 mg via ORAL
  Filled 2018-05-19 (×5): qty 2

## 2018-05-19 MED ORDER — FLUVOXAMINE MALEATE 50 MG PO TABS
50.0000 mg | ORAL_TABLET | Freq: Every day | ORAL | Status: DC
  Administered 2018-05-19 – 2018-05-20 (×2): 50 mg via ORAL
  Filled 2018-05-19 (×2): qty 1

## 2018-05-19 NOTE — ED Notes (Signed)
Pt. Transferred to [facility] from ED to room 3 after screening for contraband. Report to include Situation, Background, Assessment and Recommendations from Kim Summers RN. Pt. Oriented to unit including Q15 minute rounds as well as the security cameras for their protection. Patient is alert and oriented, warm and dry in no acute distress. Patient denies SI, HI, and AVH. Pt. Encouraged to let me know if needs arise. 

## 2018-05-19 NOTE — ED Notes (Signed)
Patient transferred to [facility] room 314

## 2018-05-19 NOTE — Progress Notes (Signed)
[facility] MD Progress Note  05/20/2018 11:44 AM [name] [name]  MRN:  [id number]  Subjective:    [name] [name] met with treatment team today. He looks much better than last night when he was covering his face with a blanket. He denies hallucinations, claims good compliance with treatment and reports no side effects from medications. He however insists that he has proof of harasment on social media and is disappointed that the police did not take him seriously. He called them 2 more times after they showed up at home.   Attempted to contact Thayer Headings from [place] team but no contact so far. I would like to know if there were medication adjustments made after his discharge from the hospital and if it makes sense to switch him from Trilafon to Haldol and possibly, Haldol decanoate injections.Clozapine level somehow was not done on admission as planned. It is in the works now.   Attempted to call the mother [phone] to inquire if SSDI and guardianship process has been initiated. No response.  Principal Problem: Undifferentiated schizophrenia ([name] [name]) Diagnosis:   Patient Active Problem List   Diagnosis Date Noted  . Undifferentiated schizophrenia ([place]) [F20.3] 12/30/2017    Priority: High  . Hypertension [I10] 03/06/2018  . Noncompliance [Z91.19] 12/30/2017  . Aggressive behavior of adolescent [R46.89] 10/24/2015   Total Time spent with patient: 20 minutes  Past Psychiatric History: schizophrenia  Past Medical History:  Past Medical History:  Diagnosis Date  . Asthma   . Depression   . Psychosis [facility])     Past Surgical History:  Procedure Laterality Date  . BACK SURGERY     Family History: History reviewed. No pertinent family history. Family Psychiatric  History: none Social History:  Social History   Substance and Sexual Activity  Alcohol Use No     Social History   Substance and Sexual Activity  Drug Use Yes  . Types: Marijuana    Social History   Socioeconomic History   . Marital status: Single    Spouse name: Not on file  . Number of children: Not on file  . Years of education: Not on file  . Highest education level: Not on file  Occupational History  . Not on file  Social Needs  . Financial resource strain: Not on file  . Food insecurity:    Worry: Not on file    Inability: Not on file  . Transportation needs:    Medical: Not on file    Non-medical: Not on file  Tobacco Use  . Smoking status: Never Smoker  . Smokeless tobacco: Never Used  Substance and Sexual Activity  . Alcohol use: No  . Drug use: Yes    Types: Marijuana  . Sexual activity: Not Currently  Lifestyle  . Physical activity:    Days per week: Not on file    Minutes per session: Not on file  . Stress: Not on file  Relationships  . Social connections:    Talks on phone: Not on file    Gets together: Not on file    Attends religious service: Not on file    Active member of club or organization: Not on file    Attends meetings of clubs or organizations: Not on file    Relationship status: Not on file  Other Topics Concern  . Not on file  Social History Narrative  . Not on file   Additional Social History:    History of alcohol / drug use?: No history  of alcohol / drug abuse                    Sleep: Fair  Appetite:  Fair  Current Medications: Current Facility-Administered Medications  Medication Dose Route Frequency Provider Last Rate Last Dose  . acetaminophen (TYLENOL) tablet 650 mg  650 mg Oral Q6H PRN Sayvion Vigen B, MD      . alum & mag hydroxide-simeth (MAALOX/MYLANTA) 200-200-20 MG/5ML suspension 30 mL  30 mL Oral Q4H PRN Hulen Mandler B, MD      . cloZAPine (CLOZARIL) tablet 300 mg  300 mg Oral QHS Icela Glymph B, MD   300 mg at 05/19/18 2154  . fluvoxaMINE (LUVOX) tablet 50 mg  50 mg Oral QHS Winta Barcelo B, MD   50 mg at 05/19/18 2154  . Influenza vac split quadrivalent PF (FLUARIX) injection 0.5 mL  0.5 mL  Intramuscular Tomorrow-1000 Drayke Grabel B, MD      . magnesium hydroxide (MILK OF MAGNESIA) suspension 30 mL  30 mL Oral Daily PRN Makeshia Seat B, MD      . metFORMIN (GLUCOPHAGE) tablet 500 mg  500 mg Oral Q breakfast Jelissa Espiritu B, MD   500 mg at 05/20/18 1046  . metoprolol tartrate (LOPRESSOR) tablet 12.5 mg  12.5 mg Oral BID Leann Mayweather B, MD   12.5 mg at 05/20/18 1045  . nicotine (NICODERM CQ - dosed in mg/24 hours) patch 21 mg  21 mg Transdermal Daily Meganne Rita B, MD   21 mg at 05/20/18 1049  . perphenazine (TRILAFON) tablet 8 mg  8 mg Oral BID  Farver B, MD   8 mg at 05/20/18 1046  . traZODone (DESYREL) tablet 100 mg  100 mg Oral QHS PRN Calyn Rubi B, MD   100 mg at 05/19/18 2154    Lab Results:  Results for orders placed or performed during the hospital encounter of 05/19/18 (from the past 48 hour(s))  Comprehensive metabolic panel     Status: Abnormal   Collection Time: 05/19/18  2:07 AM  Result Value Ref Range   Sodium 141 135 - 145 mmol/L   Potassium 3.6 3.5 - 5.1 mmol/L   Chloride 105 98 - 111 mmol/L   CO2 24 22 - 32 mmol/L   Glucose, Bld 102 (H) 70 - 99 mg/dL   BUN 15 6 - 20 mg/dL   Creatinine, Ser 0.95 0.61 - 1.24 mg/dL   Calcium 9.6 8.9 - 10.3 mg/dL   Total Protein 8.7 (H) 6.5 - 8.1 g/dL   Albumin 5.1 (H) 3.5 - 5.0 g/dL   AST 30 15 - 41 U/L   ALT 46 (H) 0 - 44 U/L   Alkaline Phosphatase 83 38 - 126 U/L   Total Bilirubin 0.8 0.3 - 1.2 mg/dL   GFR calc non Af Amer >60 >60 mL/min   GFR calc Af Amer >60 >60 mL/min    Comment: (NOTE) The eGFR has been calculated using the CKD EPI equation. This calculation has not been validated in all clinical situations. eGFR's persistently <60 mL/min signify possible Chronic Kidney Disease.    Anion gap 12 5 - 15    Comment: Performed at [facility], [address] [place], [place] [address]  Ethanol     Status: None   Collection Time: 05/19/18  2:07 AM   Result Value Ref Range   Alcohol, Ethyl (B) <10 <10 mg/dL    Comment: (NOTE) Lowest detectable limit for serum alcohol is 10  mg/dL. For medical purposes only. Performed at [facility], [address] [place], [place] [address]   Salicylate level     Status: None   Collection Time: 05/19/18  2:07 AM  Result Value Ref Range   Salicylate Lvl <5.0 2.8 - 30.0 mg/dL    Comment: Performed at [facility], [address] [place], [place] [address]  Acetaminophen level     Status: Abnormal   Collection Time: 05/19/18  2:07 AM  Result Value Ref Range   Acetaminophen (Tylenol), Serum <10 (L) 10 - 30 ug/mL    Comment: (NOTE) Therapeutic concentrations vary significantly. A range of 10-30 ug/mL  may be an effective concentration for many patients. However, some  are best treated at concentrations outside of this range. Acetaminophen concentrations >150 ug/mL at 4 hours after ingestion  and >50 ug/mL at 12 hours after ingestion are often associated with  toxic reactions. Performed at [facility], [address] [place], [place] [address]   cbc     Status: Abnormal   Collection Time: 05/19/18  2:07 AM  Result Value Ref Range   WBC 6.3 4.0 - 10.5 K/uL   RBC 5.83 (H) 4.22 - 5.81 MIL/uL   Hemoglobin 16.5 13.0 - 17.0 g/dL   HCT 46.9 39.0 - 52.0 %   MCV 80.4 80.0 - 100.0 fL   MCH 28.3 26.0 - 34.0 pg   MCHC 35.2 30.0 - 36.0 g/dL   RDW 13.0 11.5 - 15.5 %   Platelets 248 150 - 400 K/uL   nRBC 0.0 0.0 - 0.2 %    Comment: Performed at [facility], [address] [place], [place] [address]  Urine Drug Screen, Qualitative     Status: None   Collection Time: 05/19/18  2:07 AM  Result Value Ref Range   Tricyclic, Ur Screen NONE DETECTED NONE DETECTED   Amphetamines, Ur Screen NONE DETECTED NONE DETECTED   MDMA (Ecstasy)Ur Screen NONE DETECTED NONE DETECTED   Cocaine Metabolite,Ur [place] NONE DETECTED NONE DETECTED   Opiate, Ur Screen NONE DETECTED NONE  DETECTED   Phencyclidine (PCP) Ur S NONE DETECTED NONE DETECTED   Cannabinoid 50 Ng, Ur [place] NONE DETECTED NONE DETECTED   Barbiturates, Ur Screen NONE DETECTED NONE DETECTED   Benzodiazepine, Ur Scrn NONE DETECTED NONE DETECTED   Methadone Scn, Ur NONE DETECTED NONE DETECTED    Comment: (NOTE) Tricyclics + metabolites, urine    Cutoff 1000 ng/mL Amphetamines + metabolites, urine  Cutoff 1000 ng/mL MDMA (Ecstasy), urine              Cutoff 500 ng/mL Cocaine Metabolite, urine          Cutoff 300 ng/mL Opiate + metabolites, urine        Cutoff 300 ng/mL Phencyclidine (PCP), urine         Cutoff 25 ng/mL Cannabinoid, urine                 Cutoff 50 ng/mL Barbiturates + metabolites, urine  Cutoff 200 ng/mL Benzodiazepine, urine              Cutoff 200 ng/mL Methadone, urine                   Cutoff 300 ng/mL The urine drug screen provides only a preliminary, unconfirmed analytical test result and should not be used for non-medical purposes. Clinical consideration and professional judgment should be applied to any positive drug screen result due to possible interfering  substances. A more specific alternate chemical method must be used in order to obtain a confirmed analytical result. Gas chromatography / mass spectrometry (GC/MS) is the preferred confirmat ory method. Performed at [facility], [address] [place], [place] [address]     Blood Alcohol level:  Lab Results  Component Value Date   [facility] <10 05/19/2018   ETH <10 60/45/4098    Metabolic Disorder Labs: Lab Results  Component Value Date   HGBA1C 4.9 03/08/2018   MPG 93.93 03/08/2018   MPG 85.32 12/30/2017   No results found for: PROLACTIN Lab Results  Component Value Date   CHOL 238 (H) 03/08/2018   TRIG 69 03/08/2018   HDL 56 03/08/2018   CHOLHDL 4.3 03/08/2018   VLDL 14 03/08/2018   LDLCALC 168 (H) 03/08/2018   LDLCALC 97 12/30/2017    Physical Findings: AIMS:  , ,  ,  ,    CIWA:    COWS:      Musculoskeletal: Strength & Muscle Tone: within normal limits Gait & Station: normal Patient leans: N/A  Psychiatric Specialty Exam: Physical Exam  Nursing note and vitals reviewed. Psychiatric: His affect is blunt. His speech is delayed. He is slowed, withdrawn and actively hallucinating. Thought content is paranoid and delusional. Cognition and memory are impaired. He expresses impulsivity.    Review of Systems  Neurological: Negative.   Psychiatric/Behavioral: Negative.   All other systems reviewed and are negative.   Blood pressure 138/75, pulse 84, temperature 97.8 F (36.6 C), temperature source Oral, resp. rate 16, height _0  (1.854 m), weight 98.9 kg, SpO2 99 %.Body mass index is 28.76 kg/m.  General Appearance: Casual  Eye Contact:  None  Speech:  Slow  Volume:  Decreased  Mood:  Irritable  Affect:  Flat  Thought Process:  Irrelevant  Orientation:  Full (Time, Place, and Person)  Thought Content:  Delusions, Hallucinations: Auditory and Paranoid Ideation  Suicidal Thoughts:  No  Homicidal Thoughts:  No  Memory:  Immediate;   Poor Recent;   Poor Remote;   Poor  Judgement:  Poor  Insight:  Lacking  Psychomotor Activity:  Psychomotor Retardation  Concentration:  Concentration: Poor and Attention Span: Poor  Recall:  Poor  Fund of Knowledge:  Poor  Language:  Poor  Akathisia:  No  Handed:  Right  AIMS (if indicated):     Assets:  Communication Skills Desire for Improvement Financial Resources/Insurance Housing Physical Health Resilience Social Support  ADL's:  Intact  Cognition:  WNL  Sleep:  Number of Hours: 6.75     Treatment Plan Summary: Daily contact with patient to assess and evaluate symptoms and progress in treatment and Medication management   [name] [name] is a 22 year old male with a history of schuizophrenia admitted for psychotic break in the context of medication noncompliance.  #Psychosis -restart Clozapine 300 mg nightly, level  pending -Luvox 50 mg nightly for augmentation -plan to discontinue Trilafon -plan to start Haldol 10 mg nightly -offer haldol decanoate injections to improve compliance -Trazodone 100 mg nightly  #Tachycardia -continue Metoprolol 11.9 mg BID  #Metabolic syndrome prevention -Metformin 500 mg BID  #Labs -obtained recently -EKG  #Disposition  -discharge with the mother -follow up with [facility] team  Orson Slick, MD 05/20/2018, 11:44 AM

## 2018-05-19 NOTE — [facility] Suicide Risk Assessment (Signed)
[facility] Admission Suicide Risk Assessment   Nursing information obtained from:  Patient Demographic factors:  Male, Unemployed, Adolescent or young adult Current Mental Status:  NA Loss Factors:  NA Historical Factors:  NA Risk Reduction Factors:  Living with another person, especially a relative  Total Time spent with patient: 1 hour Principal Problem: <principal problem not specified> Diagnosis:   Patient Active Problem List   Diagnosis Date Noted  . Hypertension [I10] 03/06/2018  . Undifferentiated schizophrenia ([facility]) [F20.3] 12/30/2017  . Noncompliance [Z91.19] 12/30/2017  . Aggressive behavior of adolescent [R46.89] 10/24/2015   Subjective Data: psychotic break  Continued Clinical Symptoms:  Alcohol Use Disorder Identification Test Final Score (AUDIT): 0 The "Alcohol Use Disorders Identification Test", Guidelines for Use in Primary Care, Second Edition.  World Science writer [facility]). Score between 0-7:  no or low risk or alcohol related problems. Score between 8-15:  moderate risk of alcohol related problems. Score between 16-19:  high risk of alcohol related problems. Score 20 or above:  warrants further diagnostic evaluation for alcohol dependence and treatment.   CLINICAL FACTORS:   Schizophrenia:   Command hallucinatons Less than 22 years old Paranoid or undifferentiated type   Musculoskeletal: Strength & Muscle Tone: within normal limits Gait & Station: normal Patient leans: N/A  Psychiatric Specialty Exam: Physical Exam  Nursing note and vitals reviewed. Psychiatric: His affect is blunt. His speech is delayed and tangential. He is withdrawn and actively hallucinating. Thought content is paranoid and delusional. Cognition and memory are impaired. He expresses impulsivity.    Review of Systems  Neurological: Negative.   Psychiatric/Behavioral: Positive for hallucinations.  All other systems reviewed and are negative.   Blood pressure 137/88, pulse 93,  temperature 97.7 F (36.5 C), temperature source Oral, resp. rate 18, height 6\' 1"  (1.854 m), weight 98.9 kg, SpO2 100 %.Body mass index is 28.76 kg/m.  General Appearance: Casual  Eye Contact:  Good  Speech:  Slow  Volume:  Decreased  Mood:  Euthymic  Affect:  Blunt  Thought Process:  Irrelevant  Orientation:  Full (Time, Place, and Person)  Thought Content:  Delusions, Hallucinations: Auditory, Obsessions and Paranoid Ideation  Suicidal Thoughts:  No  Homicidal Thoughts:  No  Memory:  Immediate;   Poor Recent;   Poor Remote;   Poor  Judgement:  Poor  Insight:  Lacking  Psychomotor Activity:  Psychomotor Retardation  Concentration:  Concentration: Poor and Attention Span: Poor  Recall:  Poor  Fund of Knowledge:  Fair  Language:  Poor  Akathisia:  No  Handed:  Right  AIMS (if indicated):     Assets:  Communication Skills Desire for Improvement Financial Resources/Insurance Housing Physical Health Resilience Social Support  ADL's:  Intact  Cognition:  WNL  Sleep:         COGNITIVE FEATURES THAT CONTRIBUTE TO RISK:  None    SUICIDE RISK:   Moderate:  Frequent suicidal ideation with limited intensity, and duration, some specificity in terms of plans, no associated intent, good self-control, limited dysphoria/symptomatology, some risk factors present, and identifiable protective factors, including available and accessible social support.  PLAN OF CARE: hospital admission, medication management, discharge planning.  Mr. Tiberio is a 22 year old male with a history of schuizophrenia admitted for psychotic break in the context of medication noncompliance.  #Psychosis -restart Clozapine 300 mg nightly, level pending -Luvox 50 mg nightly for augmentation -discontinur Trilafon -start Haldol 10 mg nightly -offer haldol decanoate injections to improve compliance -Trazodone 100 mg nightly  #Tachycardia -continue  Metoprolol 12.5 mg BID  #Metabolic syndrome  prevention -Metformin 500 mg BID  #Labs -obtained recently -EKG  #Disposition  -discharge with the mother -follow up with [name] team  I certify that inpatient services furnished can reasonably be expected to improve the patient's condition.   Kristine Linea, MD 05/19/2018, 2:48 PM
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
## 2018-05-19 NOTE — ED Triage Notes (Signed)
Patient to ED with [facility] under IVC.

## 2018-05-19 NOTE — Consult Note (Signed)
Case discussed with TTS. Chart reviewed. Pt well known to [place]. Meets criteria for psychiatric admission. Orders entered. Thank you.
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
## 2018-05-19 NOTE — Tx Team (Signed)
Initial Treatment Plan 05/19/2018 11:25 AM [name] [name] ZOX:[id number]    PATIENT STRESSORS: Educational concerns Occupational concerns   PATIENT STRENGTHS: Average or above average intelligence Communication skills Supportive family/friends   PATIENT IDENTIFIED PROBLEMS: Suspecious behavior    Psychosis                    DISCHARGE CRITERIA:  Ability to meet basic life and health needs Adequate post-discharge living arrangements Motivation to continue treatment in a less acute level of care Verbal commitment to aftercare and medication compliance  PRELIMINARY DISCHARGE PLAN: Attend aftercare/continuing care group Return to previous living arrangement  PATIENT/FAMILY INVOLVEMENT: This treatment plan has been presented to and reviewed with the patient, [name] [name], and/or family member, .  The patient and family have been given the opportunity to ask questions and make suggestions.  Leonarda Salon, RN 05/19/2018, 11:25 AM

## 2018-05-19 NOTE — ED Notes (Signed)
Patient discussed with Furniture conservator/restorer that he did not like being downstairs inpatient BMU, he says its always crowded down there and he would rather stay here in the BHU, its less crowded  Informed patient that downstairs he will have more therapeutic treatment

## 2018-05-19 NOTE — H&P (Signed)
Psychiatric Admission Assessment Adult  Patient Identification: [name] [name] MRN:  [id number] Date of Evaluation:  05/19/2018 Chief Complaint:  Psychosis Principal Diagnosis: Undifferentiated schizophrenia ([place]) Diagnosis:   Patient Active Problem List   Diagnosis Date Noted  . Undifferentiated schizophrenia ([place]) [F20.3] 12/30/2017    Priority: High  . Hypertension [I10] 03/06/2018  . Noncompliance [Z91.19] 12/30/2017  . Aggressive behavior of adolescent [R46.89] 10/24/2015   History of Present Illness:   Identifying data. [name] [name] is a 22 year old male with a history of schizophrenia.  Chief complaint. "I called the police. They did not believe me and brought me here."  History of present illness. The patient was brought to the hospital after calling police repeatedly complaining that someone, his friend's father, has been harassing him on social media. He also demanded MRI for "a hole in my head" believing that someone broke into his house and performed a lobotomy.  He denies any symptoms of depression, anxiety or psychosis. He is in bed under blankets not maintaining eye contact. He reports good medication compliance. He follows up with [facility] and has been on Clozapine, level pending.  Past psychiatric history. Diagnosed with treatment resistant schizophrenia. Several hospitalizations. Responded well to Clozapine but compliance a problem.   Family psychiatric history. None.  Social history. The family came fron [place] as refugees. He graduated from high school but his education was interrupted by illness. Lives with his mom and brother. We recommended SSDI application and guardianship. Unsure if the family took action.   Total Time spent with patient: 1 hour  Is the patient at risk to self? No.  Has the patient been a risk to self in the past 6 months? No.  Has the patient been a risk to self within the distant past? No.  Is the patient a risk to others?  No.  Has the patient been a risk to others in the past 6 months? No.  Has the patient been a risk to others within the distant past? No.   Prior Inpatient Therapy:   Prior Outpatient Therapy:    Alcohol Screening: 1. How often do you have a drink containing alcohol?: Never 2. How many drinks containing alcohol do you have on a typical day when you are drinking?: 1 or 2 3. How often do you have six or more drinks on one occasion?: Never AUDIT-C Score: 0 4. How often during the last year have you found that you were not able to stop drinking once you had started?: Never 5. How often during the last year have you failed to do what was normally expected from you becasue of drinking?: Never 6. How often during the last year have you needed a first drink in the morning to get yourself going after a heavy drinking session?: Never 7. How often during the last year have you had a feeling of guilt of remorse after drinking?: Never 8. How often during the last year have you been unable to remember what happened the night before because you had been drinking?: Never 9. Have you or someone else been injured as a result of your drinking?: No 10. Has a relative or friend or a doctor or another health worker been concerned about your drinking or suggested you cut down?: No Alcohol Use Disorder Identification Test Final Score (AUDIT): 0 Intervention/Follow-up: AUDIT Score <7 follow-up not indicated Substance Abuse History in the last 12 months:  No. Consequences of Substance Abuse: NA Previous Psychotropic Medications: Yes  Psychological  Evaluations: No  Past Medical History:  Past Medical History:  Diagnosis Date  . Asthma   . Depression   . Psychosis [facility])     Past Surgical History:  Procedure Laterality Date  . BACK SURGERY     Family History: History reviewed. No pertinent family history.  Tobacco Screening: Have you used any form of tobacco in the last 30 days? (Cigarettes, Smokeless  Tobacco, Cigars, and/or Pipes): No Social History:  Social History   Substance and Sexual Activity  Alcohol Use No     Social History   Substance and Sexual Activity  Drug Use Yes  . Types: Marijuana    Additional Social History:      History of alcohol / drug use?: No history of alcohol / drug abuse                    Allergies:  No Known Allergies Lab Results:  Results for orders placed or performed during the hospital encounter of 05/19/18 (from the past 48 hour(s))  Comprehensive metabolic panel     Status: Abnormal   Collection Time: 05/19/18  2:07 AM  Result Value Ref Range   Sodium 141 135 - 145 mmol/L   Potassium 3.6 3.5 - 5.1 mmol/L   Chloride 105 98 - 111 mmol/L   CO2 24 22 - 32 mmol/L   Glucose, Bld 102 (H) 70 - 99 mg/dL   BUN 15 6 - 20 mg/dL   Creatinine, Ser 0.95 0.61 - 1.24 mg/dL   Calcium 9.6 8.9 - 10.3 mg/dL   Total Protein 8.7 (H) 6.5 - 8.1 g/dL   Albumin 5.1 (H) 3.5 - 5.0 g/dL   AST 30 15 - 41 U/L   ALT 46 (H) 0 - 44 U/L   Alkaline Phosphatase 83 38 - 126 U/L   Total Bilirubin 0.8 0.3 - 1.2 mg/dL   GFR calc non Af Amer >60 >60 mL/min   GFR calc Af Amer >60 >60 mL/min    Comment: (NOTE) The eGFR has been calculated using the CKD EPI equation. This calculation has not been validated in all clinical situations. eGFR's persistently <60 mL/min signify possible Chronic Kidney Disease.    Anion gap 12 5 - 15    Comment: Performed at [facility], [address] [place], [place] [address]  Ethanol     Status: None   Collection Time: 05/19/18  2:07 AM  Result Value Ref Range   Alcohol, Ethyl (B) <10 <10 mg/dL    Comment: (NOTE) Lowest detectable limit for serum alcohol is 10 mg/dL. For medical purposes only. Performed at [facility], [address] [place], [place] [address]   Salicylate level     Status: None   Collection Time: 05/19/18  2:07 AM  Result Value Ref Range   Salicylate Lvl <3.6 2.8 - 30.0 mg/dL     Comment: Performed at [facility], [address] [place], [place] [address]  Acetaminophen level     Status: Abnormal   Collection Time: 05/19/18  2:07 AM  Result Value Ref Range   Acetaminophen (Tylenol), Serum <10 (L) 10 - 30 ug/mL    Comment: (NOTE) Therapeutic concentrations vary significantly. A range of 10-30 ug/mL  may be an effective concentration for many patients. However, some  are best treated at concentrations outside of this range. Acetaminophen concentrations >150 ug/mL at 4 hours after ingestion  and >50 ug/mL at 12 hours after ingestion are often associated with  toxic  reactions. Performed at [facility], [address] [place], [place] [address]   cbc     Status: Abnormal   Collection Time: 05/19/18  2:07 AM  Result Value Ref Range   WBC 6.3 4.0 - 10.5 K/uL   RBC 5.83 (H) 4.22 - 5.81 MIL/uL   Hemoglobin 16.5 13.0 - 17.0 g/dL   HCT 46.9 39.0 - 52.0 %   MCV 80.4 80.0 - 100.0 fL   MCH 28.3 26.0 - 34.0 pg   MCHC 35.2 30.0 - 36.0 g/dL   RDW 13.0 11.5 - 15.5 %   Platelets 248 150 - 400 K/uL   nRBC 0.0 0.0 - 0.2 %    Comment: Performed at [facility], [address], [place], [place] [address]  Urine Drug Screen, Qualitative     Status: None   Collection Time: 05/19/18  2:07 AM  Result Value Ref Range   Tricyclic, Ur Screen NONE DETECTED NONE DETECTED   Amphetamines, Ur Screen NONE DETECTED NONE DETECTED   MDMA (Ecstasy)Ur Screen NONE DETECTED NONE DETECTED   Cocaine Metabolite,Ur [place] NONE DETECTED NONE DETECTED   Opiate, Ur Screen NONE DETECTED NONE DETECTED   Phencyclidine (PCP) Ur S NONE DETECTED NONE DETECTED   Cannabinoid 50 Ng, Ur [place] NONE DETECTED NONE DETECTED   Barbiturates, Ur Screen NONE DETECTED NONE DETECTED   Benzodiazepine, Ur Scrn NONE DETECTED NONE DETECTED   Methadone Scn, Ur NONE DETECTED NONE DETECTED    Comment: (NOTE) Tricyclics + metabolites, urine    Cutoff 1000 ng/mL Amphetamines + metabolites, urine   Cutoff 1000 ng/mL MDMA (Ecstasy), urine              Cutoff 500 ng/mL Cocaine Metabolite, urine          Cutoff 300 ng/mL Opiate + metabolites, urine        Cutoff 300 ng/mL Phencyclidine (PCP), urine         Cutoff 25 ng/mL Cannabinoid, urine                 Cutoff 50 ng/mL Barbiturates + metabolites, urine  Cutoff 200 ng/mL Benzodiazepine, urine              Cutoff 200 ng/mL Methadone, urine                   Cutoff 300 ng/mL The urine drug screen provides only a preliminary, unconfirmed analytical test result and should not be used for non-medical purposes. Clinical consideration and professional judgment should be applied to any positive drug screen result due to possible interfering substances. A more specific alternate chemical method must be used in order to obtain a confirmed analytical result. Gas chromatography / mass spectrometry (GC/MS) is the preferred confirmat ory method. Performed at [facility], [address] [place], [place] [address]     Blood Alcohol level:  Lab Results  Component Value Date   [facility] <10 05/19/2018   ETH <10 11/65/7903    Metabolic Disorder Labs:  Lab Results  Component Value Date   HGBA1C 4.9 03/08/2018   MPG 93.93 03/08/2018   MPG 85.32 12/30/2017   No results found for: PROLACTIN Lab Results  Component Value Date   CHOL 238 (H) 03/08/2018   TRIG 69 03/08/2018   HDL 56 03/08/2018   CHOLHDL 4.3 03/08/2018   VLDL 14 03/08/2018   LDLCALC 168 (H) 03/08/2018   LDLCALC 97 12/30/2017    Current Medications: Current Facility-Administered Medications  Medication Dose Route Frequency  Provider Last Rate Last Dose  . acetaminophen (TYLENOL) tablet 650 mg  650 mg Oral Q6H PRN Eleyna Brugh B, MD      . alum & mag hydroxide-simeth (MAALOX/MYLANTA) 200-200-20 MG/5ML suspension 30 mL  30 mL Oral Q4H PRN Wallace Gappa B, MD      . cloZAPine (CLOZARIL) tablet 300 mg  300 mg Oral QHS Eriyanna Kofoed B, MD      .  fluvoxaMINE (LUVOX) tablet 50 mg  50 mg Oral QHS Vonya Ohalloran B, MD      . Derrill Memo ON 05/20/2018] Influenza vac split quadrivalent PF (FLUARIX) injection 0.5 mL  0.5 mL Intramuscular Tomorrow-1000 Patches Mcdonnell B, MD      . magnesium hydroxide (MILK OF MAGNESIA) suspension 30 mL  30 mL Oral Daily PRN Courtland Reas B, MD      . Derrill Memo ON 05/20/2018] metFORMIN (GLUCOPHAGE) tablet 500 mg  500 mg Oral Q breakfast Jacey Eckerson B, MD      . metoprolol tartrate (LOPRESSOR) tablet 12.5 mg  12.5 mg Oral BID Eknoor Novack B, MD   12.5 mg at 05/19/18 1150  . perphenazine (TRILAFON) tablet 8 mg  8 mg Oral BID Jasaiah Karwowski B, MD   8 mg at 05/19/18 1150  . traZODone (DESYREL) tablet 100 mg  100 mg Oral QHS PRN Daksha Koone B, MD       PTA Medications: Medications Prior to Admission  Medication Sig Dispense Refill Last Dose  . cloZAPine (CLOZARIL) 100 MG tablet Take 3 tablets (300 mg total) by mouth at bedtime. 90 tablet 1`   . fluvoxaMINE (LUVOX) 50 MG tablet Take 1 tablet (50 mg total) by mouth at bedtime. 30 tablet 1   . LORazepam (ATIVAN) 0.5 MG tablet Take 1 tablet (0.5 mg total) by mouth 2 (two) times daily. 60 tablet 1   . metFORMIN (GLUCOPHAGE) 500 MG tablet Take 1 tablet (500 mg total) by mouth daily with breakfast. 30 tablet 1   . metoprolol tartrate (LOPRESSOR) 25 MG tablet Take 0.5 tablets (12.5 mg total) by mouth 2 (two) times daily. 60 tablet 1   . perphenazine (TRILAFON) 8 MG tablet Take 1 tablet (8 mg total) by mouth 2 (two) times daily. 60 tablet 1   . traZODone (DESYREL) 100 MG tablet Take 1 tablet (100 mg total) by mouth at bedtime as needed for sleep. 30 tablet 1     Musculoskeletal: Strength & Muscle Tone: within normal limits Gait & Station: normal Patient leans: N/A  Psychiatric Specialty Exam: I reviewed physical exam performed in the ER and agree with the findings Physical Exam  Nursing note and vitals reviewed. Psychiatric: His  affect is blunt and inappropriate. His speech is delayed and tangential. He is slowed, withdrawn and actively hallucinating. Thought content is paranoid and delusional. Cognition and memory are impaired. He expresses impulsivity.    Review of Systems  Neurological: Negative.   Psychiatric/Behavioral: Positive for hallucinations.  All other systems reviewed and are negative.   Blood pressure 137/88, pulse 93, temperature 97.7 F (36.5 C), temperature source Oral, resp. rate 18, height _0  (1.854 m), weight 98.9 kg, SpO2 100 %.Body mass index is 28.76 kg/m.  See SRA                                                  Sleep:  Treatment Plan Summary: Daily contact with patient to assess and evaluate symptoms and progress in treatment and Medication management   [name] [name] is a 22 year old male with a history of schuizophrenia admitted for psychotic break in the context of medication noncompliance.  #Psychosis -restart Clozapine 300 mg nightly, level pending -Luvox 50 mg nightly for augmentation -discontinur Trilafon -start Haldol 10 mg nightly -offer haldol decanoate injections to improve compliance -Trazodone 100 mg nightly  #Tachycardia -continue Metoprolol 67.2 mg BID  #Metabolic syndrome prevention -Metformin 500 mg BID  #Labs -obtained recently -EKG  #Disposition  -discharge with the mother -follow up with [name] [name] ACT team   Observation Level/Precautions:  15 minute checks  Laboratory:  CBC Chemistry Profile UDS UA  Psychotherapy:    Medications:    Consultations:    Discharge Concerns:    Estimated LOS:  Other:     Physician Treatment Plan for Primary Diagnosis: Undifferentiated schizophrenia ([place]) Long Term Goal(s): Improvement in symptoms so as ready for discharge  Short Term Goals: Ability to identify changes in lifestyle to reduce recurrence of condition will improve, Ability to verbalize feelings will improve, Ability  to disclose and discuss suicidal ideas, Ability to demonstrate self-control will improve, Ability to identify and develop effective coping behaviors will improve, Ability to maintain clinical measurements within normal limits will improve, Compliance with prescribed medications will improve and Ability to identify triggers associated with substance abuse/mental health issues will improve  Physician Treatment Plan for Secondary Diagnosis: Principal Problem:   Undifferentiated schizophrenia ([place]) Active Problems:   Noncompliance   Hypertension  Long Term Goal(s): Improvement in symptoms so as ready for discharge  Short Term Goals: NA  I certify that inpatient services furnished can reasonably be expected to improve the patient's condition.    Orson Slick, MD 10/29/20193:09 PM

## 2018-05-19 NOTE — ED Notes (Signed)
Hourly rounding reveals patient sleeping in room. No complaints, stable, in no acute distress. Q15 minute rounds and monitoring via Security Cameras to continue. 

## 2018-05-19 NOTE — Progress Notes (Signed)
Patient pleasant and cooperative during admission assessment. Patient denies SI/HI at this time. Patient denies AVH. Patient informed of fall risk status, fall risk assessed "low" at this time. Patient oriented to unit/staff/room. Patient denies any questions/concerns at this time. Patient safe on unit with Q15 minute checks for safety. Skin assessment and body search done,no contraband found. 
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
## 2018-05-20 LAB — CLOZAPINE (CLOZARIL)
Clozapine Lvl: 868 ng/mL — ABNORMAL HIGH (ref 350–650)
NORCLOZAPINE: 327 ng/mL
Total(Cloz+Norcloz): 1195 ng/mL

## 2018-05-20 MED ORDER — TEMAZEPAM 15 MG PO CAPS
30.0000 mg | ORAL_CAPSULE | Freq: Once | ORAL | Status: AC
  Administered 2018-05-20: 30 mg via ORAL
  Filled 2018-05-20: qty 2
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
## 2018-05-20 NOTE — Plan of Care (Signed)
  Problem: Education: Goal: Mental status will improve Outcome: Progressing  Patient appears less psychotic .  
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
## 2018-05-20 NOTE — Progress Notes (Signed)
Recreation Therapy Notes  Date: 05/19/2018  Time: 9:30 pm   Location: Craft Room   Behavioral response: N/A   Intervention Topic: Communication  Discussion/Intervention: Patient did not attend group.   Clinical Observations/Feedback:  Patient did not attend group.   Mckenna Boruff LRT/CTRS        [name] [name] 05/20/2018 11:47 AM 

## 2018-05-20 NOTE — Progress Notes (Signed)
Recreation Therapy Notes  INPATIENT RECREATION THERAPY ASSESSMENT  Patient Details Name: [name] [name] MRN: [id number] DOB: 12/16/1995 Today's Date: 05/20/2018       Information Obtained From: Patient  Able to Participate in Assessment/Interview: Yes  Patient Presentation: Responsive  Reason for Admission (Per Patient): Active Symptoms, Other (Comments)(Being Harrassed)  Patient Stressors: Other (Comment)(Social media)  Coping Skills:      Leisure Interests (2+):  Games - Video games  Frequency of Recreation/Participation: Chief Executive Officer of Community Resources:  Yes  Community Resources:  [place], [employer]  Current Use:    If no, Barriers?:    Expressed Interest in [employer] Information:    [place] of Residence:  Film/video editor  Patient Main Form of Transportation: Therapist, music  Patient Strengths:  Friendly  Patient Identified Areas of Improvement:  My education  Patient Goal for Hospitalization:  To get out soon  Current SI (including self-harm):  No  Current HI:  No  Current AVH: No  Staff Intervention Plan: Group Attendance, Collaborate with Interdisciplinary Treatment Team  Consent to Intern Participation: N/A  [name] [name] 05/20/2018, 4:24 PM

## 2018-05-20 NOTE — Progress Notes (Addendum)
[facility] MD Progress Note  05/21/2018 9:42 AM [name] [name]  MRN:  [id number]  Subjective:    [name] [name] was unable to sleep even after receiving the nightly dose of Clozapine, Luvox, Trazodone. He was pacing and irritable at night. He eventually slept 5 hours with Restoril 30 mg. [name] [name] has no complains and denies any problems claiming good treatment compliance at home.  Tried to call hi mon wothout success.  Spoke with ACT team. They deliver medications and assure labs but have no confirmation that he has been taking it. They have no contact with the mother as she works long hours.   Principal Problem: Undifferentiated schizophrenia (HCC) Diagnosis:   Patient Active Problem List   Diagnosis Date Noted  . Undifferentiated schizophrenia (HCC) [F20.3] 12/30/2017    Priority: High  . Hypertension [I10] 03/06/2018  . Noncompliance [Z91.19] 12/30/2017  . Aggressive behavior of adolescent [R46.89] 10/24/2015   Total Time spent with patient: 20 minutes  Past Psychiatric History: schizophrenia  Past Medical History:  Past Medical History:  Diagnosis Date  . Asthma   . Depression   . Psychosis [facility])     Past Surgical History:  Procedure Laterality Date  . BACK SURGERY     Family History: History reviewed. No pertinent family history. Family Psychiatric  History: none Social History:  Social History   Substance and Sexual Activity  Alcohol Use No     Social History   Substance and Sexual Activity  Drug Use Yes  . Types: Marijuana    Social History   Socioeconomic History  . Marital status: Single    Spouse name: Not on file  . Number of children: Not on file  . Years of education: Not on file  . Highest education level: Not on file  Occupational History  . Not on file  Social Needs  . Financial resource strain: Not on file  . Food insecurity:    Worry: Not on file    Inability: Not on file  . Transportation needs:    Medical: Not on file    Non-medical: Not on file   Tobacco Use  . Smoking status: Never Smoker  . Smokeless tobacco: Never Used  Substance and Sexual Activity  . Alcohol use: No  . Drug use: Yes    Types: Marijuana  . Sexual activity: Not Currently  Lifestyle  . Physical activity:    Days per week: Not on file    Minutes per session: Not on file  . Stress: Not on file  Relationships  . Social connections:    Talks on phone: Not on file    Gets together: Not on file    Attends religious service: Not on file    Active member of club or organization: Not on file    Attends meetings of clubs or organizations: Not on file    Relationship status: Not on file  Other Topics Concern  . Not on file  Social History Narrative  . Not on file   Additional Social History:    History of alcohol / drug use?: No history of alcohol / drug abuse                    Sleep: Fair  Appetite:  Fair  Current Medications: Current Facility-Administered Medications  Medication Dose Route Frequency Provider Last Rate Last Dose  . acetaminophen (TYLENOL) tablet 650 mg  650 mg Oral Q6H PRN Verona Hartshorn B, MD      . alum &  mag hydroxide-simeth (MAALOX/MYLANTA) 200-200-20 MG/5ML suspension 30 mL  30 mL Oral Q4H PRN Nylah Butkus B, MD      . cloZAPine (CLOZARIL) tablet 300 mg  300 mg Oral QHS Jordynne Mccown B, MD   300 mg at 05/20/18 2152  . fluvoxaMINE (LUVOX) tablet 50 mg  50 mg Oral QHS Kalden Wanke B, MD   50 mg at 05/20/18 2152  . Influenza vac split quadrivalent PF (FLUARIX) injection 0.5 mL  0.5 mL Intramuscular Tomorrow-1000 Ketih Goodie B, MD      . magnesium hydroxide (MILK OF MAGNESIA) suspension 30 mL  30 mL Oral Daily PRN Deoni Cosey B, MD      . metFORMIN (GLUCOPHAGE) tablet 500 mg  500 mg Oral Q breakfast Janith Nielson B, MD   500 mg at 05/21/18 0804  . metoprolol tartrate (LOPRESSOR) tablet 12.5 mg  12.5 mg Oral BID Grisell Bissette B, MD   12.5 mg at 05/21/18 0805  . nicotine  (NICODERM CQ - dosed in mg/24 hours) patch 21 mg  21 mg Transdermal Daily Harbor Paster B, MD   21 mg at 05/20/18 1049  . perphenazine (TRILAFON) tablet 8 mg  8 mg Oral BID Macalister Arnaud B, MD   8 mg at 05/21/18 0804  . traZODone (DESYREL) tablet 100 mg  100 mg Oral QHS PRN Ryelee Albee B, MD   100 mg at 05/20/18 2153    Lab Results:  No results found for this or any previous visit (from the past 48 hour(s)).  Blood Alcohol level:  Lab Results  Component Value Date   ETH <10 05/19/2018   ETH <10 03/06/2018    Metabolic Disorder Labs: Lab Results  Component Value Date   HGBA1C 4.9 03/08/2018   MPG 93.93 03/08/2018   MPG 85.32 12/30/2017   No results found for: PROLACTIN Lab Results  Component Value Date   CHOL 238 (H) 03/08/2018   TRIG 69 03/08/2018   HDL 56 03/08/2018   CHOLHDL 4.3 03/08/2018   VLDL 14 03/08/2018   LDLCALC 168 (H) 03/08/2018   LDLCALC 97 12/30/2017    Physical Findings: AIMS:  , ,  ,  ,    CIWA:    COWS:     Musculoskeletal: Strength & Muscle Tone: within normal limits Gait & Station: normal Patient leans: N/A  Psychiatric Specialty Exam: Physical Exam  Nursing note and vitals reviewed. Psychiatric: His speech is normal. His affect is blunt. He is withdrawn. Thought content is paranoid. Cognition and memory are impaired. He expresses impulsivity.    Review of Systems  Neurological: Negative.   Psychiatric/Behavioral: Negative.   All other systems reviewed and are negative.   Blood pressure (!) 129/91, pulse 85, temperature (!) 97.5 F (36.4 C), temperature source Oral, resp. rate 16, height 6\' 1"  (1.854 m), weight 98.9 kg, SpO2 92 %.Body mass index is 28.76 kg/m.  General Appearance: Casual  Eye Contact:  Good  Speech:  Clear and Coherent  Volume:  Normal  Mood:  Irritable  Affect:  Blunt  Thought Process:  Goal Directed and Descriptions of Associations: Tangential  Orientation:  Full (Time, Place, and Person)   Thought Content:  Paranoid Ideation  Suicidal Thoughts:  No  Homicidal Thoughts:  No  Memory:  Immediate;   Fair Recent;   Fair Remote;   Fair  Judgement:  Poor  Insight:  Lacking  Psychomotor Activity:  Psychomotor Retardation  Concentration:  Concentration: Poor and Attention Span: Poor  Recall:  Poor  [employer]  of Knowledge:  Poor  Language:  Poor  Akathisia:  No  Handed:  Right  AIMS (if indicated):     Assets:  Communication Skills Desire for Improvement Financial Resources/Insurance Housing Physical Health Resilience Social Support  ADL's:  Intact  Cognition:  WNL  Sleep:  Number of Hours: 5     Treatment Plan Summary: Daily contact with patient to assess and evaluate symptoms and progress in treatment and Medication management   Mr. Mcglothen is a 22 year old male with a history of schuizophrenia admitted for psychotic break in the context of medication noncompliance. We were incorrect. Clozapine level is over 1,000.  #Psychosis -continue Clozapine 300 mg nightly -lower Luvox to 25 mg nightly for augmentation,  -discontinue Trilafon -give Haldol 10 mg now, the mother reports that [name] [name] gets catatonic with haldol  -offer haldol decanoate injections to improve compliance -start depakote 500 mg BID for affective component and seizure prevention  #Insomnia -discontinue Trazodone  -continue Restoril 30 mg nightly  #Tachycardia  -continue Metoprolol 12.5 mg BID  #Metabolic syndrom prevention -Metformin 500 mg BID  #Labs -obtained recently -EKG reviewed, NSR with QTc 404  #Disposition  -discharge with the mother -follow up with [name] ACT team  Kristine Linea, MD 05/21/2018, 9:42 AM

## 2018-05-20 NOTE — [facility] Group Notes (Signed)
[facility] Group Notes:  (Nursing/MHT/Case Management/Adjunct)  Date:  05/20/2018  Time:  9:35 PM  Type of Therapy:  Group Therapy  Participation Level:  Active  Participation Quality:  Appropriate  Affect:  Appropriate  Cognitive:  Alert  Insight:  Good  Engagement in Group:  Engaged  Modes of Intervention:  Support  Summary of Progress/Problems:  [name] [name] 05/20/2018, 9:35 PM

## 2018-05-20 NOTE — Progress Notes (Signed)
   05/20/18 1030  Clinical Encounter Type  Visited With Patient;Health care provider  Visit Type Initial;Spiritual support;Behavioral Health  Referral From Social work  Consult/Referral To Chaplain  Spiritual Encounters  Spiritual Needs Emotional;Other (Comment)   CH attended the patient's treatment team meeting. [name] [name] stated that he was taking his medication and has never stopped. Also he is being harassed and when he called the police to report the harassment, they came and arrested him. The patient stated that the police would not believe him. He set a goal of getting released soon.

## 2018-05-20 NOTE — [facility] Group Notes (Signed)

## 2018-05-20 NOTE — Progress Notes (Signed)
Patient alert and oriented x 4, denies SI/HI/AVH, no distress noted, affect is bright, making good eye contact, thoughts are organized and coherent, he was initially restless pacing the unit but he eventually settled down and went to bed., patient was offered support and encouraged to attend evening wrap up group. Patient attends the group and he was appropriate with peers and staff. Patient is receptive to treatment and complaint with medication. 15 minutes safety checks maintained will continue to closely monitor.

## 2018-05-20 NOTE — Tx Team (Addendum)
Interdisciplinary Treatment and Diagnostic Plan Update  05/20/2018 Time of Session: 10:40 AM CORDELLE DAHMEN MRN: [id number]  Principal Diagnosis: Undifferentiated schizophrenia (HCC)  Secondary Diagnoses: Principal Problem:   Undifferentiated schizophrenia (HCC) Active Problems:   Noncompliance   Hypertension   Current Medications:  Current Facility-Administered Medications  Medication Dose Route Frequency Provider Last Rate Last Dose  . acetaminophen (TYLENOL) tablet 650 mg  650 mg Oral Q6H PRN [name], [name], MD      . alum & mag hydroxide-simeth (MAALOX/MYLANTA) 200-200-20 MG/5ML suspension 30 mL  30 mL Oral Q4H PRN [name], [name], MD      . cloZAPine (CLOZARIL) tablet 300 mg  300 mg Oral QHS [name], [name], MD   300 mg at 05/19/18 2154  . fluvoxaMINE (LUVOX) tablet 50 mg  50 mg Oral QHS [name], [name], MD   50 mg at 05/19/18 2154  . Influenza vac split quadrivalent PF (FLUARIX) injection 0.5 mL  0.5 mL Intramuscular Tomorrow-1000 [name], [name], MD      . magnesium hydroxide (MILK OF MAGNESIA) suspension 30 mL  30 mL Oral Daily PRN [name], [name], MD      . metFORMIN (GLUCOPHAGE) tablet 500 mg  500 mg Oral Q breakfast [name], [name], MD   500 mg at 05/20/18 1046  . metoprolol tartrate (LOPRESSOR) tablet 12.5 mg  12.5 mg Oral BID [name], [name], MD   12.5 mg at 05/20/18 1045  . nicotine (NICODERM CQ - dosed in mg/24 hours) patch 21 mg  21 mg Transdermal Daily [name], [name], MD   21 mg at 05/20/18 1049  . perphenazine (TRILAFON) tablet 8 mg  8 mg Oral BID [name], [name], MD   8 mg at 05/20/18 1046  . traZODone (DESYREL) tablet 100 mg  100 mg Oral QHS PRN [name], [name], MD   100 mg at 05/19/18 2154   PTA Medications: Medications Prior to Admission  Medication Sig Dispense Refill Last Dose  . cloZAPine (CLOZARIL) 100 MG tablet Take 3 tablets (300 mg total) by mouth at bedtime. 90 tablet 1`   .  fluvoxaMINE (LUVOX) 50 MG tablet Take 1 tablet (50 mg total) by mouth at bedtime. 30 tablet 1   . LORazepam (ATIVAN) 0.5 MG tablet Take 1 tablet (0.5 mg total) by mouth 2 (two) times daily. 60 tablet 1   . metFORMIN (GLUCOPHAGE) 500 MG tablet Take 1 tablet (500 mg total) by mouth daily with breakfast. 30 tablet 1   . metoprolol tartrate (LOPRESSOR) 25 MG tablet Take 0.5 tablets (12.5 mg total) by mouth 2 (two) times daily. 60 tablet 1   . perphenazine (TRILAFON) 8 MG tablet Take 1 tablet (8 mg total) by mouth 2 (two) times daily. 60 tablet 1   . traZODone (DESYREL) 100 MG tablet Take 1 tablet (100 mg total) by mouth at bedtime as needed for sleep. 30 tablet 1     Patient Stressors: Educational concerns Occupational concerns  Patient Strengths: Average or above average intelligence Communication skills Supportive family/friends  Treatment Modalities: Medication Management, Group therapy, Case management,  1 to 1 session with clinician, Psychoeducation, Recreational therapy.   Physician Treatment Plan for Primary Diagnosis: Undifferentiated schizophrenia (HCC) Long Term Goal(s): Improvement in symptoms so as ready for discharge Improvement in symptoms so as ready for discharge   Short Term Goals: Ability to identify changes in lifestyle to reduce recurrence of condition will improve Ability to verbalize feelings will improve Ability to disclose and discuss suicidal  ideas Ability to demonstrate self-control will improve Ability to identify and develop effective coping behaviors will improve Ability to maintain clinical measurements within normal limits will improve Compliance with prescribed medications will improve Ability to identify triggers associated with substance abuse/mental health issues will improve NA  Medication Management: Evaluate patient's response, side effects, and tolerance of medication regimen.  Therapeutic Interventions: 1 to 1 sessions, Unit Group sessions and  Medication administration.  Evaluation of Outcomes: Progressing  Physician Treatment Plan for Secondary Diagnosis: Principal Problem:   Undifferentiated schizophrenia (HCC) Active Problems:   Noncompliance   Hypertension  Long Term Goal(s): Improvement in symptoms so as ready for discharge Improvement in symptoms so as ready for discharge   Short Term Goals: Ability to identify changes in lifestyle to reduce recurrence of condition will improve Ability to verbalize feelings will improve Ability to disclose and discuss suicidal ideas Ability to demonstrate self-control will improve Ability to identify and develop effective coping behaviors will improve Ability to maintain clinical measurements within normal limits will improve Compliance with prescribed medications will improve Ability to identify triggers associated with substance abuse/mental health issues will improve NA     Medication Management: Evaluate patient's response, side effects, and tolerance of medication regimen.  Therapeutic Interventions: 1 to 1 sessions, Unit Group sessions and Medication administration.  Evaluation of Outcomes: Progressing   RN Treatment Plan for Primary Diagnosis: Undifferentiated schizophrenia (HCC) Long Term Goal(s): Knowledge of disease and therapeutic regimen to maintain health will improve  Short Term Goals: Ability to demonstrate self-control, Ability to identify and develop effective coping behaviors will improve and Compliance with prescribed medications will improve  Medication Management: RN will administer medications as ordered by provider, will assess and evaluate patient's response and provide education to patient for prescribed medication. RN will report any adverse and/or side effects to prescribing provider.  Therapeutic Interventions: 1 on 1 counseling sessions, Psychoeducation, Medication administration, Evaluate responses to treatment, Monitor vital signs and CBGs as  ordered, Perform/monitor CIWA, COWS, AIMS and Fall Risk screenings as ordered, Perform wound care treatments as ordered.  Evaluation of Outcomes: Progressing   LCSW Treatment Plan for Primary Diagnosis: Undifferentiated schizophrenia (HCC) Long Term Goal(s): Safe transition to appropriate next level of care at discharge, Engage patient in therapeutic group addressing interpersonal concerns.  Short Term Goals: Engage patient in aftercare planning with referrals and resources and Increase skills for wellness and recovery  Therapeutic Interventions: Assess for all discharge needs, 1 to 1 time with Social worker, Explore available resources and support systems, Assess for adequacy in community support network, Educate family and significant other(s) on suicide prevention, Complete Psychosocial Assessment, Interpersonal group therapy.  Evaluation of Outcomes: Progressing   Progress in Treatment: Attending groups: No. Participating in groups: No. Taking medication as prescribed: Yes. Toleration medication: Yes. Family/Significant other contact made: No, will contact:  CSW will contact pt's mother, [name] Patient understands diagnosis: Yes. Discussing patient identified problems/goals with staff: Yes. Medical problems stabilized or resolved: Yes. Denies suicidal/homicidal ideation: Yes. Issues/concerns per patient self-inventory: No. Other: n/a  New problem(s) identified: No, Describe:  No new problems identified  New Short Term/Long Term Goal(s):  Patient Goals:  "I hope that I can get out of here soon.  I think that the cops messed up."  Discharge Plan or Barriers: None identified.  Pt will return home with his family and resume ACT services with [facility].  Reason for Continuation of Hospitalization: Delusions  Medication stabilization  Estimated Length of Stay: 3-5 days  Recreational Therapy: Patient Stressors: N/A Patient Goal: Patient will engage in groups  without prompting or encouragement from LRT x3 group sessions within 5 recreation therapy group sessions   Attendees: Patient: Adekunle Rohrbach 05/20/2018 3:05 PM  Physician: Kristine Linea, MD 05/20/2018 3:05 PM  Nursing: Bruce Donath, RN 05/20/2018 3:05 PM  RN Care Manager: 05/20/2018 3:05 PM  Social Worker: Huey Romans, LCSW 05/20/2018 3:05 PM  Recreational Therapist: Garret Reddish, LRT 05/20/2018 3:05 PM  Other: [name], LCSWA 05/20/2018 3:05 PM  Other: Lowella Dandy, LCSW 05/20/2018 3:05 PM  Other: Damian Leavell, Chaplain 05/20/2018 3:05 PM    Scribe for Treatment Team: Alease Frame, LCSW 05/20/2018 3:05 PM

## 2018-05-21 LAB — CLOZAPINE (CLOZARIL)
CLOZAPINE LVL: 305 ng/mL — AB (ref 350–650)
NORCLOZAPINE: 196 ng/mL
TOTAL(CLOZ+ NORCLOZ): 501 ng/mL

## 2018-05-21 MED ORDER — DIVALPROEX SODIUM 500 MG PO DR TAB
500.0000 mg | DELAYED_RELEASE_TABLET | Freq: Two times a day (BID) | ORAL | Status: DC
  Administered 2018-05-21 – 2018-05-25 (×8): 500 mg via ORAL
  Filled 2018-05-21 (×9): qty 1

## 2018-05-21 MED ORDER — TEMAZEPAM 15 MG PO CAPS
30.0000 mg | ORAL_CAPSULE | Freq: Every day | ORAL | Status: DC
  Administered 2018-05-21 – 2018-05-24 (×4): 30 mg via ORAL
  Filled 2018-05-21 (×4): qty 2

## 2018-05-21 MED ORDER — CLOZAPINE 100 MG PO TABS
300.0000 mg | ORAL_TABLET | Freq: Every day | ORAL | Status: DC
  Administered 2018-05-21 – 2018-05-24 (×4): 300 mg via ORAL
  Filled 2018-05-21 (×4): qty 3

## 2018-05-21 MED ORDER — FLUVOXAMINE MALEATE 50 MG PO TABS
25.0000 mg | ORAL_TABLET | Freq: Every day | ORAL | Status: DC
  Administered 2018-05-21 – 2018-05-24 (×4): 25 mg via ORAL
  Filled 2018-05-21 (×4): qty 1

## 2018-05-21 MED ORDER — CLOZAPINE 100 MG PO TABS: 200.0000 mg | ORAL_TABLET | Freq: Every day | ORAL | Status: DC

## 2018-05-21 MED ORDER — HALOPERIDOL 5 MG PO TABS
10.0000 mg | ORAL_TABLET | Freq: Every day | ORAL | Status: DC
  Administered 2018-05-21 – 2018-05-24 (×4): 10 mg via ORAL
  Filled 2018-05-21 (×4): qty 2

## 2018-05-21 MED ORDER — HALOPERIDOL 5 MG PO TABS
10.0000 mg | ORAL_TABLET | Freq: Once | ORAL | Status: AC
  Administered 2018-05-21: 10 mg via ORAL
  Filled 2018-05-21: qty 2

## 2018-05-21 NOTE — [facility] Group Notes (Signed)
LCSW Group Therapy Note  05/21/2018 1:00 pm  Type of Therapy/Topic:  Group Therapy:  Balance in Life  Participation Level:  Did Not Attend  Description of Group:    This group will address the concept of balance and how it feels and looks when one is unbalanced. Patients will be encouraged to process areas in their lives that are out of balance and identify reasons for remaining unbalanced. Facilitators will guide patients in utilizing problem-solving interventions to address and correct the stressor making their life unbalanced. Understanding and applying boundaries will be explored and addressed for obtaining and maintaining a balanced life. Patients will be encouraged to explore ways to assertively make their unbalanced needs known to significant others in their lives, using other group members and facilitator for support and feedback.  Therapeutic Goals: 1. Patient will identify two or more emotions or situations they have that consume much of in their lives. 2. Patient will identify signs/triggers that life has become out of balance:  3. Patient will identify two ways to set boundaries in order to achieve balance in their lives:  4. Patient will demonstrate ability to communicate their needs through discussion and/or role plays  Summary of Patient Progress:      Therapeutic Modalities:   Cognitive Behavioral Therapy Solution-Focused Therapy Assertiveness Training  Alease Frame, [place] 05/21/2018 3:33 PM

## 2018-05-21 NOTE — Progress Notes (Signed)
Received [name] [name] this vAm after breakfast, he was compliant with his medications. He has been OOB in the milieu at intervals and his behavior has been appropriate. He does not believe have have not mention other people are after him. His affect is bright and making eye contact appropriately.

## 2018-05-21 NOTE — Plan of Care (Signed)
  Problem: Coping: Goal: Ability to identify and develop effective coping behavior will improve Outcome: Progressing  Patient appears less anxious no distress noted.

## 2018-05-21 NOTE — Progress Notes (Signed)
Recreation Therapy Notes  Date: 05/21/2018  Time: 9:30 pm   Location: Craft Room   Behavioral response: N/A   Intervention Topic: Emotions  Discussion/Intervention: Patient did not attend group.   Clinical Observations/Feedback:  Patient did not attend group.   Navreet Bolda LRT/CTRS        Taeya Theall 05/21/2018 11:04 AM

## 2018-05-21 NOTE — Progress Notes (Signed)
Patient alert and oriented x 4, denies SI/HI/AVH, no distress noted, affect is bright, he makes good eye contact, thoughts are organized and coherent, he was noted  restless pacing and he c/o insomnia MD notified and new order for sleeping aid was received.  . Patient was offered support and encouraged to attend evening wrap up group. Patient attends the group and he was appropriate with peers and staff. Patient is receptive to treatment and complaint with medication. 15 minutes safety checks maintained will continue to closely monitor.

## 2018-05-21 NOTE — Progress Notes (Addendum)
[facility] MD Progress Note  05/22/2018 11:27 AM [name] [name]  MRN:  [id number]  Subjective:    Mr. Wichmann is out of bed, pacing the hallway. He acceptes medications and has been compliant at home as evidenced by elevated Clo level. He however remains paranoid and delusional. Still believes that he is bullied by his friends father but now wants "his lawyer to handle it". 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  He was started on Haldol yesterday and received 10 mg at lunch and night without any problems. We will continue oral Haldol and give decanoate injection.  Principal Problem: Undifferentiated schizophrenia (HCC) Diagnosis:   Patient Active Problem List   Diagnosis Date Noted  . Undifferentiated schizophrenia (HCC) [F20.3] 12/30/2017    Priority: High  . Hypertension [I10] 03/06/2018  . Noncompliance [Z91.19] 12/30/2017  . Aggressive behavior of adolescent [R46.89] 10/24/2015   Total Time spent with patient: 20 minutes  Past Psychiatric History: schizophrenia  Past Medical History:  Past Medical History:  Diagnosis Date  . Asthma   . Depression   . Psychosis [facility])     Past Surgical History:  Procedure Laterality Date  . BACK SURGERY     Family History: History reviewed. No pertinent family history. Family Psychiatric  History: none Social History:  Social History   Substance and Sexual Activity  Alcohol Use No     Social History   Substance and Sexual Activity  Drug Use Yes  . Types: Marijuana    Social History   Socioeconomic History  . Marital status: Single    Spouse name: Not on file  . Number of children: Not on file  . Years of education: Not on file  . Highest education level: Not on file  Occupational History  . Not on file  Social Needs  . Financial resource strain: Not on file  . Food insecurity:    Worry: Not on file    Inability: Not on file  . Transportation needs:    Medical:  Not on file    Non-medical: Not on file  Tobacco Use  . Smoking status: Never Smoker  . Smokeless tobacco: Never Used  Substance and Sexual Activity  . Alcohol use: No  . Drug use: Yes    Types: Marijuana  . Sexual activity: Not Currently  Lifestyle  . Physical activity:    Days per week: Not on file    Minutes per session: Not on file  . Stress: Not on file  Relationships  . Social connections:    Talks on phone: Not on file    Gets together: Not on file    Attends religious service: Not on file    Active member of club or organization: Not on file    Attends meetings of clubs or organizations: Not on file    Relationship status: Not on file  Other Topics Concern  . Not on file  Social History Narrative  . Not on file   Additional Social History:    History of alcohol / drug use?: No history of alcohol / drug abuse                    Sleep: Fair  Appetite:  Fair  Current Medications: Current Facility-Administered Medications  Medication Dose Route Frequency Provider Last Rate Last Dose  . acetaminophen (TYLENOL) tablet 650 mg  650 mg Oral Q6H PRN Aaliayah Miao B, MD      .  alum & mag hydroxide-simeth (MAALOX/MYLANTA) 200-200-20 MG/5ML suspension 30 mL  30 mL Oral Q4H PRN Darral Rishel B, MD      . cloZAPine (CLOZARIL) tablet 300 mg  300 mg Oral QHS Devon Kingdon B, MD   300 mg at 05/21/18 2116  . divalproex (DEPAKOTE) DR tablet 500 mg  500 mg Oral Q12H Bettye Sitton B, MD   500 mg at 05/22/18 0800  . fluvoxaMINE (LUVOX) tablet 25 mg  25 mg Oral QHS Molleigh Huot B, MD   25 mg at 05/21/18 2117  . haloperidol (HALDOL) tablet 10 mg  10 mg Oral QHS Jocie Meroney B, MD   10 mg at 05/21/18 2117  . Influenza vac split quadrivalent PF (FLUARIX) injection 0.5 mL  0.5 mL Intramuscular Tomorrow-1000 Deshanda Molitor B, MD      . magnesium hydroxide (MILK OF MAGNESIA) suspension 30 mL  30 mL Oral Daily PRN Vishwa Dais B, MD    30 mL at 05/21/18 2143  . metFORMIN (GLUCOPHAGE) tablet 500 mg  500 mg Oral Q breakfast Alanta Scobey B, MD   500 mg at 05/22/18 0800  . metoprolol tartrate (LOPRESSOR) tablet 12.5 mg  12.5 mg Oral BID Makye Radle B, MD   12.5 mg at 05/22/18 0800  . nicotine (NICODERM CQ - dosed in mg/24 hours) patch 21 mg  21 mg Transdermal Daily Clydine Parkison B, MD   21 mg at 05/21/18 1522  . temazepam (RESTORIL) capsule 30 mg  30 mg Oral QHS Derhonda Eastlick B, MD   30 mg at 05/21/18 2117    Lab Results:  No results found for this or any previous visit (from the past 48 hour(s)).  Blood Alcohol level:  Lab Results  Component Value Date   ETH <10 05/19/2018   ETH <10 03/06/2018    Metabolic Disorder Labs: Lab Results  Component Value Date   HGBA1C 4.9 03/08/2018   MPG 93.93 03/08/2018   MPG 85.32 12/30/2017   No results found for: PROLACTIN Lab Results  Component Value Date   CHOL 238 (H) 03/08/2018   TRIG 69 03/08/2018   HDL 56 03/08/2018   CHOLHDL 4.3 03/08/2018   VLDL 14 03/08/2018   LDLCALC 168 (H) 03/08/2018   LDLCALC 97 12/30/2017    Physical Findings: AIMS:  , ,  ,  ,    CIWA:    COWS:     Musculoskeletal: Strength & Muscle Tone: within normal limits Gait & Station: normal Patient leans: N/A  Psychiatric Specialty Exam: Physical Exam  Nursing note and vitals reviewed. Psychiatric: His speech is normal and behavior is normal. His affect is blunt. Thought content is paranoid and delusional. Cognition and memory are impaired. He expresses impulsivity.    Review of Systems  Neurological: Negative.   Psychiatric/Behavioral: Negative.   All other systems reviewed and are negative.   Blood pressure 117/73, pulse 86, temperature 97.7 F (36.5 C), temperature source Oral, resp. rate 16, height 6\' 1"  (1.854 m), weight 98.9 kg, SpO2 98 %.Body mass index is 28.76 kg/m.  General Appearance: Casual  Eye Contact:  Good  Speech:  Clear and Coherent   Volume:  Normal  Mood:  Irritable  Affect:  Congruent  Thought Process:  Irrelevant  Orientation:  Full (Time, Place, and Person)  Thought Content:  Delusions and Paranoid Ideation  Suicidal Thoughts:  No  Homicidal Thoughts:  No  Memory:  Immediate;   Poor Recent;   Poor Remote;   Poor  Judgement:  Poor  Insight:  Lacking  Psychomotor Activity:  Increased  Concentration:  Concentration: Poor and Attention Span: Poor  Recall:  Poor  Fund of Knowledge:  Fair  Language:  Fair  Akathisia:  No  Handed:  Right  AIMS (if indicated):     Assets:  Communication Skills Desire for Improvement Financial Resources/Insurance Housing Physical Health Resilience Social Support  ADL's:  Intact  Cognition:  WNL  Sleep:  Number of Hours: 630     Treatment Plan Summary: Daily contact with patient to assess and evaluate symptoms and progress in treatment and Medication management   Mr. Havens is a 22 year old male with a history of schuizophrenia admitted for psychotic break in the context of medication noncompliance. We were incorrect. Clozapine level is over 1,000.  #Psychosis -continue Clozapine 300 mg nightly -lower Luvox to 25 mg nightly for augmentation,  -discontinueTrilafon -continue Haldol 10 mg nightly  -offer haldol decanoate injections to improve compliance -continue Depakote 500 mg BID for affective component and seizure prevention  #Insomnia -discontinue Trazodone  -continue Restoril 30 mg nightly  #Tachycardia  -continue Metoprolol 12.5 mg BID  #Metabolic syndrom prevention -Metformin 500 mg BID  #Labs -obtained recently -EKG reviewed, NSR with QTc 404  #Disposition  -discharge with the mother -follow up with [name]  Kristine Linea, MD 05/22/2018, 11:27 AM

## 2018-05-22 NOTE — [facility] Group Notes (Signed)
Feelings Around Relapse 05/22/2018 1PM  Type of Therapy and Topic:  Group Therapy:  Feelings around Relapse and Recovery  Participation Level:  Did Not Attend   Description of Group:    Patients in this group will discuss emotions they experience before and after a relapse. They will process how experiencing these feelings, or avoidance of experiencing them, relates to having a relapse. Facilitator will guide patients to explore emotions they have related to recovery. Patients will be encouraged to process which emotions are more powerful. They will be guided to discuss the emotional reaction significant others in their lives may have to patients' relapse or recovery. Patients will be assisted in exploring ways to respond to the emotions of others without this contributing to a relapse.  Therapeutic Goals: 1. Patient will identify two or more emotions that lead to a relapse for them 2. Patient will identify two emotions that result when they relapse 3. Patient will identify two emotions related to recovery 4. Patient will demonstrate ability to communicate their needs through discussion and/or role plays   Summary of Patient Progress:     Therapeutic Modalities:   Cognitive Behavioral Therapy Solution-Focused Therapy Assertiveness Training Relapse Prevention Therapy   Rayna Brenner T Sunjai Levandoski, LCSW 05/22/2018 3:20 PM   

## 2018-05-22 NOTE — Plan of Care (Signed)
Patient is responding well to treatment, and  coherent , contract for safety  of self and others, seeing patient in common area socializing with peers, watching television,and playing games that is appropriate in the unit, patient denies any SI/HI/AVH, med compliant, patient's mother was in the unit visiting with patient with out any issues  . Patient is encouraged with support no distress Problem: Education: Goal: Knowledge of  General Education information/materials will improve Outcome: Progressing Goal: Emotional status will improve Outcome: Progressing Goal: Mental status will improve Outcome: Progressing Goal: Verbalization of understanding the information provided will improve Outcome: Progressing   Problem: Activity: Goal: Interest or engagement in activities will improve Outcome: Progressing Goal: Sleeping patterns will improve Outcome: Progressing   Problem: Health Behavior/Discharge Planning: Goal: Identification of resources available to assist in meeting health care needs will improve Outcome: Progressing Goal: Compliance with treatment plan for underlying cause of condition will improve Outcome: Progressing   Problem: Activity: Goal: Will identify at least one activity in which they can participate Outcome: Progressing   Problem: Coping: Goal: Ability to identify and develop effective coping behavior will improve Outcome: Progressing Goal: Ability to interact with others will improve Outcome: Progressing Goal: Demonstration of participation in decision-making regarding own care will improve Outcome: Progressing Goal: Ability to use eye contact when communicating with others will improve Outcome: Progressing   Problem: Self-Concept: Goal: Will verbalize positive feelings about self Outcome: Progressing   Problem: Education: Goal: Knowledge of General Education information will improve Description Including pain rating scale, medication(s)/side  effects and non-pharmacologic comfort measures Outcome: Progressing   Problem: Health Behavior/Discharge Planning: Goal: Ability to manage health-related needs will improve Outcome: Progressing   Problem: Clinical Measurements: Goal: Ability to maintain clinical measurements within normal limits will improve Outcome: Progressing Goal: Will remain free from infection Outcome: Progressing Goal: Diagnostic test results will improve Outcome: Progressing Goal: Respiratory complications will improve Outcome: Progressing Goal: Cardiovascular complication will be avoided Outcome: Progressing   Problem: Activity: Goal: Risk for activity intolerance will decrease Outcome: Progressing   Problem: Nutrition: Goal: Adequate nutrition will be maintained Outcome: Progressing   Problem: Coping: Goal: Level of anxiety will decrease Outcome: Progressing   Problem: Elimination: Goal: Will not experience complications related to bowel motility Outcome: Progressing Goal: Will not experience complications related to urinary retention Outcome: Progressing   Problem: Pain Managment: Goal: General experience of comfort will improve Outcome: Progressing   Problem: Safety: Goal: Ability to remain free from injury will improve Outcome: Progressing   Problem: Skin Integrity: Goal: Risk for impaired skin integrity will decrease Outcome: Progressing

## 2018-05-22 NOTE — Plan of Care (Signed)
Patient has been improving in the unit and compliant with therapeutic regimen, socializing and participating in peer activities, voice no concerns, sleep is continuous and appetite is good , patient is calm no aggressive behaviors contract for safety of self and others and denies any SI/HI/ AVH, support and encouragement is provided , mood is appropriate and bright , 15 minute safety rounding is maintained no distress.   Problem: Education: Goal: Knowledge of [place] General Education information/materials will improve Outcome: Progressing Goal: Emotional status will improve Outcome: Progressing Goal: Mental status will improve Outcome: Progressing Goal: Verbalization of understanding the information provided will improve Outcome: Progressing   Problem: Activity: Goal: Interest or engagement in activities will improve Outcome: Progressing Goal: Sleeping patterns will improve Outcome: Progressing   Problem: Health Behavior/Discharge Planning: Goal: Identification of resources available to assist in meeting health care needs will improve Outcome: Progressing Goal: Compliance with treatment plan for underlying cause of condition will improve Outcome: Progressing   Problem: Activity: Goal: Will identify at least one activity in which they can participate Outcome: Progressing   Problem: Coping: Goal: Ability to identify and develop effective coping behavior will improve Outcome: Progressing Goal: Ability to interact with others will improve Outcome: Progressing Goal: Demonstration of participation in decision-making regarding own care will improve Outcome: Progressing Goal: Ability to use eye contact when communicating with others will improve Outcome: Progressing   Problem: Self-Concept: Goal: Will verbalize positive feelings about self Outcome: Progressing   Problem: Education: Goal: Knowledge of General Education information will improve Description Including pain  rating scale, medication(s)/side effects and non-pharmacologic comfort measures Outcome: Progressing   Problem: Health Behavior/Discharge Planning: Goal: Ability to manage health-related needs will improve Outcome: Progressing   Problem: Clinical Measurements: Goal: Ability to maintain clinical measurements within normal limits will improve Outcome: Progressing Goal: Will remain free from infection Outcome: Progressing Goal: Diagnostic test results will improve Outcome: Progressing Goal: Respiratory complications will improve Outcome: Progressing Goal: Cardiovascular complication will be avoided Outcome: Progressing   Problem: Activity: Goal: Risk for activity intolerance will decrease Outcome: Progressing   Problem: Nutrition: Goal: Adequate nutrition will be maintained Outcome: Progressing   Problem: Coping: Goal: Level of anxiety will decrease Outcome: Progressing   Problem: Elimination: Goal: Will not experience complications related to bowel motility Outcome: Progressing Goal: Will not experience complications related to urinary retention Outcome: Progressing   Problem: Pain Managment: Goal: General experience of comfort will improve Outcome: Progressing   Problem: Safety: Goal: Ability to remain free from injury will improve Outcome: Progressing   Problem: Skin Integrity: Goal: Risk for impaired skin integrity will decrease Outcome: Progressing

## 2018-05-22 NOTE — Plan of Care (Signed)
Patient is alert and oriented x 4. Maintains eye contact when communicating with staff. Compliant with treatment plan but was noted to be isolative to his room today. Patient not observed interacting with other peers. Denies hearing voices, denies any SI/HI or pain at this time. Takes medication without difficulty. Milieu remains safw with q 15 minute safety checks.

## 2018-05-22 NOTE — Progress Notes (Signed)
Recreation Therapy Notes          [name] [name] 05/22/2018 11:14 AM 

## 2018-05-23 NOTE — Progress Notes (Signed)
[facility] MD Progress Note  05/23/2018 4:04 PM [name] [name]  MRN:  [id number]  Subjective:   [name] [name] has no complaints today. Feels "good". But he is in bed with his head covered. He shows his face briefly to talk to me. Less paranoid. His mother visited last night. I will be able to call her tomorrow when off work. He accepts medications but refuses Haldol decanoate injection. Sleep and appetite are good.  Decision has to be made about Luvox. His Clo level was low without it but is unnecessarily high now. Need to check Depakote level. I was unable to talk to Dr. Alena Bills, his promary psychiatrist, so far.  Principal Problem: Undifferentiated schizophrenia (HCC) Diagnosis:   Patient Active Problem List   Diagnosis Date Noted  . Undifferentiated schizophrenia (HCC) [F20.3] 12/30/2017    Priority: High  . Hypertension [I10] 03/06/2018  . Noncompliance [Z91.19] 12/30/2017  . Aggressive behavior of adolescent [R46.89] 10/24/2015   Total Time spent with patient: 15 minutes  Past Psychiatric History: schizophrenia  Past Medical History:  Past Medical History:  Diagnosis Date  . Asthma   . Depression   . Psychosis [facility])     Past Surgical History:  Procedure Laterality Date  . BACK SURGERY     Family History: History reviewed. No pertinent family history. Family Psychiatric  History: none Social History:  Social History   Substance and Sexual Activity  Alcohol Use No     Social History   Substance and Sexual Activity  Drug Use Yes  . Types: Marijuana    Social History   Socioeconomic History  . Marital status: Single    Spouse name: Not on file  . Number of children: Not on file  . Years of education: Not on file  . Highest education level: Not on file  Occupational History  . Not on file  Social Needs  . Financial resource strain: Not on file  . Food insecurity:    Worry: Not on file    Inability: Not on file  . Transportation needs:    Medical: Not on file     Non-medical: Not on file  Tobacco Use  . Smoking status: Never Smoker  . Smokeless tobacco: Never Used  Substance and Sexual Activity  . Alcohol use: No  . Drug use: Yes    Types: Marijuana  . Sexual activity: Not Currently  Lifestyle  . Physical activity:    Days per week: Not on file    Minutes per session: Not on file  . Stress: Not on file  Relationships  . Social connections:    Talks on phone: Not on file    Gets together: Not on file    Attends religious service: Not on file    Active member of club or organization: Not on file    Attends meetings of clubs or organizations: Not on file    Relationship status: Not on file  Other Topics Concern  . Not on file  Social History Narrative  . Not on file   Additional Social History:    History of alcohol / drug use?: No history of alcohol / drug abuse                    Sleep: Fair  Appetite:  Fair  Current Medications: Current Facility-Administered Medications  Medication Dose Route Frequency Provider Last Rate Last Dose  . acetaminophen (TYLENOL) tablet 650 mg  650 mg Oral Q6H PRN Arjan Strohm B, MD      .  alum & mag hydroxide-simeth (MAALOX/MYLANTA) 200-200-20 MG/5ML suspension 30 mL  30 mL Oral Q4H PRN Michelina Mexicano B, MD      . cloZAPine (CLOZARIL) tablet 300 mg  300 mg Oral QHS Hadrian Yarbrough B, MD   300 mg at 05/22/18 2120  . divalproex (DEPAKOTE) DR tablet 500 mg  500 mg Oral Q12H Lasandra Batley B, MD   500 mg at 05/23/18 0848  . fluvoxaMINE (LUVOX) tablet 25 mg  25 mg Oral QHS Myesha Stillion B, MD   25 mg at 05/22/18 2200  . haloperidol (HALDOL) tablet 10 mg  10 mg Oral QHS Tyna Huertas B, MD   10 mg at 05/22/18 2120  . Influenza vac split quadrivalent PF (FLUARIX) injection 0.5 mL  0.5 mL Intramuscular Tomorrow-1000 Kolbi Altadonna B, MD      . magnesium hydroxide (MILK OF MAGNESIA) suspension 30 mL  30 mL Oral Daily PRN Lesley Atkin B, MD   30 mL at  05/21/18 2143  . metFORMIN (GLUCOPHAGE) tablet 500 mg  500 mg Oral Q breakfast Cheyanna Strick B, MD   500 mg at 05/23/18 0848  . metoprolol tartrate (LOPRESSOR) tablet 12.5 mg  12.5 mg Oral BID Bryley Chrisman B, MD   12.5 mg at 05/23/18 0848  . nicotine (NICODERM CQ - dosed in mg/24 hours) patch 21 mg  21 mg Transdermal Daily Keyairra Kolinski B, MD   21 mg at 05/23/18 0850  . temazepam (RESTORIL) capsule 30 mg  30 mg Oral QHS Marrion Finan B, MD   30 mg at 05/22/18 2120    Lab Results: No results found for this or any previous visit (from the past 48 hour(s)).  Blood Alcohol level:  Lab Results  Component Value Date   ETH <10 05/19/2018   ETH <10 03/06/2018    Metabolic Disorder Labs: Lab Results  Component Value Date   HGBA1C 4.9 03/08/2018   MPG 93.93 03/08/2018   MPG 85.32 12/30/2017   No results found for: PROLACTIN Lab Results  Component Value Date   CHOL 238 (H) 03/08/2018   TRIG 69 03/08/2018   HDL 56 03/08/2018   CHOLHDL 4.3 03/08/2018   VLDL 14 03/08/2018   LDLCALC 168 (H) 03/08/2018   LDLCALC 97 12/30/2017    Physical Findings: AIMS:  , ,  ,  ,    CIWA:    COWS:     Musculoskeletal: Strength & Muscle Tone: within normal limits Gait & Station: normal Patient leans: N/A  Psychiatric Specialty Exam: Physical Exam  ROS  Blood pressure 116/65, pulse 79, temperature 97.7 F (36.5 C), temperature source Oral, resp. rate 16, height 6\' 1"  (1.854 m), weight 98.9 kg, SpO2 98 %.Body mass index is 28.76 kg/m.  General Appearance: Casual  Eye Contact:  Minimal  Speech:  Clear and Coherent  Volume:  Normal  Mood:  Euthymic  Affect:  Blunt  Thought Process:  Goal Directed and Descriptions of Associations: Intact  Orientation:  Full (Time, Place, and Person)  Thought Content:  Delusions and Paranoid Ideation  Suicidal Thoughts:  No  Homicidal Thoughts:  No  Memory:  Immediate;   Fair Recent;   Fair Remote;   Fair  Judgement:  Poor   Insight:  Lacking  Psychomotor Activity:  Psychomotor Retardation  Concentration:  Concentration: Fair and Attention Span: Fair  Recall:  [employer] of Knowledge:  Fair  Language:  Fair  Akathisia:  No  Handed:  Right  AIMS (if indicated):  Assets:  Communication Skills Desire for Improvement Financial Resources/Insurance Housing Physical Health Resilience Social Support  ADL's:  Intact  Cognition:  WNL  Sleep:  Number of Hours: 7.15     Treatment Plan Summary: Daily contact with patient to assess and evaluate symptoms and progress in treatment and Medication management   [name] [name] is a 22 year old male with a history of schuizophrenia admitted for psychotic break in the context of medication noncompliance.We were incorrect. Clozapine level is over 1,000.  #Psychosis -continueClozapine 300 mg nightly -lower Luvox to 25mg  nightly for augmentation, -discontinueTrilafon -continue Haldol 10 mg nightly  -offer haldol decanoate injections to improve compliance, refuses -continue Depakote 500 mg BID for affective component and seizure prevention, level tomorrow  #Insomnia -discontinue Trazodone  -lower Restoril 15 mg nightly  #Tachycardia -continue Metoprolol 12.5 mg BID  #Metabolic syndrom prevention -Metformin 500 mg BID  #Labs -obtained recently -EKGreviewed, NSR with QTc 404  #Disposition  -discharge with the mother -follow up with [name]  Kristine Linea, MD 05/23/2018, 4:04 PM

## 2018-05-23 NOTE — Plan of Care (Signed)
Patient improved nicely and progressing socially , seeing in the milieu  participating in group scheduled activities with out any issues, voice no complain, contract for safety of self and other , denies SI/HI/AVH compliant with medications , no distress noted.    Problem: Education: Goal: Knowledge of [facility] Education information/materials will improve Outcome: Progressing Goal: Emotional status will improve Outcome: Progressing Goal: Mental status will improve Outcome: Progressing Goal: Verbalization of understanding the information provided will improve Outcome: Progressing   Problem: Activity: Goal: Interest or engagement in activities will improve Outcome: Progressing Goal: Sleeping patterns will improve Outcome: Progressing   Problem: Health Behavior/Discharge Planning: Goal: Identification of resources available to assist in meeting health care needs will improve Outcome: Progressing Goal: Compliance with treatment plan for underlying cause of condition will improve Outcome: Progressing   Problem: Activity: Goal: Will identify at least one activity in which they can participate Outcome: Progressing   Problem: Coping: Goal: Ability to identify and develop effective coping behavior will improve Outcome: Progressing Goal: Ability to interact with others will improve Outcome: Progressing Goal: Demonstration of participation in decision-making regarding own care will improve Outcome: Progressing Goal: Ability to use eye contact when communicating with others will improve Outcome: Progressing   Problem: Self-Concept: Goal: Will verbalize positive feelings about self Outcome: Progressing

## 2018-05-23 NOTE — Plan of Care (Signed)
Patient knowledgeable of information received , compliant with medication received. Emotional and mental status improved . Voice of no safety concerns or sleep issues. Noted better eye contact . Denies  Suicidal ideation. Working on Engineer, site   Problem: Education: Goal: Knowledge of [facility] Education information/materials will improve Outcome: Progressing Goal: Emotional status will improve Outcome: Progressing Goal: Mental status will improve Outcome: Progressing Goal: Verbalization of understanding the information provided will improve Outcome: Progressing   Problem: Activity: Goal: Interest or engagement in activities will improve Outcome: Progressing Goal: Sleeping patterns will improve Outcome: Progressing   Problem: Health Behavior/Discharge Planning: Goal: Identification of resources available to assist in meeting health care needs will improve Outcome: Progressing Goal: Compliance with treatment plan for underlying cause of condition will improve Outcome: Progressing   Problem: Activity: Goal: Will identify at least one activity in which they can participate Outcome: Progressing   Problem: Coping: Goal: Ability to identify and develop effective coping behavior will improve Outcome: Progressing Goal: Ability to interact with others will improve Outcome: Progressing Goal: Demonstration of participation in decision-making regarding own care will improve Outcome: Progressing Goal: Ability to use eye contact when communicating with others will improve Outcome: Progressing   Problem: Self-Concept: Goal: Will verbalize positive feelings about self Outcome: Progressing

## 2018-05-23 NOTE — Progress Notes (Signed)
D: Patient stated slept good last night .Stated appetite is good and energy level  Is normal. Stated concentration is good . Stated on Depression scale  0, hopeless 0 and anxiety  0 .( low 0-10 high) Denies suicidal  homicidal ideations  .  Denies auditory hallucinations  No pain concerns . Appropriate ADL'S. Interacting with peers  In day room  conversation and staff.  Patient knowledgeable of information received , compliant with medication received. Emotional and mental status improved . Voice of no safety concerns or sleep issues. Noted better eye contact . Denies  Suicidal ideation. Working on coping and Psychologist, forensic    A: Encourage patient participation with unit programming . Instruction  Given on  Medication , verbalize understanding. R: Voice no other concerns. Staff continue to monitor

## 2018-05-23 NOTE — [facility] Group Notes (Signed)
[facility] Group Notes:  (Nursing/MHT/Case Management/Adjunct)  Date:  05/23/2018  Time:  9:42 PM  Type of Therapy:  Group Therapy  Participation Level:  Active  Participation Quality:  Redirectable and Talking doing group to other people.  Affect:  Appropriate  Cognitive:  Appropriate  Insight:  Good  Engagement in Group:  Engaged  Modes of Intervention:  Socialization  Summary of Progress/Problems:  [name] [name] 05/23/2018, 9:42 PM

## 2018-05-24 NOTE — Progress Notes (Signed)
D- Patient alert and oriented. Patient presents in a pleasant mood on assessment stating that he slept ok last night and had no major complaints to voice to this writer at this time. Patient denies SI, HI, AVH, and pain at this time. Patient also denies any depression/anxiety. Patient's goal for today is to "have a good day".   A- Scheduled medications administered to patient, per MD orders. Support and encouragement provided.  Routine safety checks conducted every 15 minutes.  Patient informed to notify staff with problems or concerns.  R- No adverse drug reactions noted. Patient contracts for safety at this time. Patient compliant with medications and treatment plan. Patient receptive, calm, and cooperative. Patient interacts well with others on the unit.  Patient remains safe at this time.

## 2018-05-24 NOTE — Plan of Care (Signed)
Patient verbalizes understanding of the general information that's been provided to him and had no further questions/concerns to voice to this writer at this time. Patient denies SI/HI/AVH as well as depression and anxiety. Patient has been asleep majority of the morning except for meals. Patient has the ability to identify the available resources that can assist him in meeting his health-care needs, however, he has not voiced anything to this Clinical research associate as of yet. Patient has been in compliance with his prescribed medication regimen and participates in the decision-making process regarding his treatment. Patient uses fair eye contact while communicating with this Clinical research associate. Patient remains safe on the unit at this time.  Problem: Education: Goal: Knowledge of [facility] Education information/materials will improve Outcome: Progressing Goal: Emotional status will improve Outcome: Progressing Goal: Mental status will improve Outcome: Progressing Goal: Verbalization of understanding the information provided will improve Outcome: Progressing   Problem: Activity: Goal: Interest or engagement in activities will improve Outcome: Progressing Goal: Sleeping patterns will improve Outcome: Progressing   Problem: Health Behavior/Discharge Planning: Goal: Identification of resources available to assist in meeting health care needs will improve Outcome: Progressing Goal: Compliance with treatment plan for underlying cause of condition will improve Outcome: Progressing   Problem: Activity: Goal: Will identify at least one activity in which they can participate Outcome: Progressing   Problem: Coping: Goal: Ability to identify and develop effective coping behavior will improve Outcome: Progressing Goal: Ability to interact with others will improve Outcome: Progressing Goal: Demonstration of participation in decision-making regarding own care will improve Outcome: Progressing Goal: Ability to use  eye contact when communicating with others will improve Outcome: Progressing   Problem: Self-Concept: Goal: Will verbalize positive feelings about self Outcome: Progressing

## 2018-05-24 NOTE — Progress Notes (Signed)
Patient alert and oriented x 4, denies pain or discomfort, noted pacing the unit, he appears less anxious interacting appropriately with peers and staff, no distress noted. Patient denies SI/HI/AVH, thoughts are organized and coherent, offered emotional support and encouraged to attend evening wrap up group. Patent attended evenig wrap up group and was appropriate, 15 mijnutes safety checks maintained will continue to monitor.

## 2018-05-24 NOTE — [facility] Group Notes (Signed)
LCSW Group Therapy Note 05/24/2018 1:15pm  Type of Therapy and Topic: Group Therapy: Feelings Around Returning Home & Establishing a Supportive Framework and Supporting Oneself When Supports Not Available  Participation Level: Did Not Attend  Description of Group:  Patients first processed thoughts and feelings about upcoming discharge. These included fears of upcoming changes, lack of change, new living environments, judgements and expectations from others and overall stigma of mental health issues. The group then discussed the definition of a supportive framework, what that looks and feels like, and how do to discern it from an unhealthy non-supportive network. The group identified different types of supports as well as what to do when your family/friends are less than helpful or unavailable  Therapeutic Goals  1. Patient will identify one healthy supportive network that they can use at discharge. 2. Patient will identify one factor of a supportive framework and how to tell it from an unhealthy network. 3. Patient able to identify one coping skill to use when they do not have positive supports from others. 4. Patient will demonstrate ability to communicate their needs through discussion and/or role plays.  Summary of Patient Progress:  Pt was invited to attend group but chose not to attend. CSW will continue to encourage pt to attend group throughout their admission.   Therapeutic Modalities Cognitive Behavioral Therapy Motivational Interviewing   Llana Deshazo  CUEBAS-COLON, LCSW 05/24/2018 1:08 PM  

## 2018-05-24 NOTE — Progress Notes (Signed)
[name] MD Progress Note  05/24/2018 4:16 PM [name] [name]  MRN:  [id number]  Subjective:    [name] [name] is in bed all day long. Pleasant and polite. Denies any problems. I heve not been able to talk to the mother in spite of multiple attempts.   Principal Problem: Undifferentiated schizophrenia (HCC) Diagnosis:   Patient Active Problem List   Diagnosis Date Noted  . Undifferentiated schizophrenia (HCC) [F20.3] 12/30/2017    Priority: High  . Hypertension [I10] 03/06/2018  . Noncompliance [Z91.19] 12/30/2017  . Aggressive behavior of adolescent [R46.89] 10/24/2015   Total Time spent with patient: 20 minutes  Past Psychiatric History: schizophrenia  Past Medical History:  Past Medical History:  Diagnosis Date  . Asthma   . Depression   . Psychosis [facility])     Past Surgical History:  Procedure Laterality Date  . BACK SURGERY     Family History: History reviewed. No pertinent family history. Family Psychiatric  History: none Social History:  Social History   Substance and Sexual Activity  Alcohol Use No     Social History   Substance and Sexual Activity  Drug Use Yes  . Types: Marijuana    Social History   Socioeconomic History  . Marital status: Single    Spouse name: Not on file  . Number of children: Not on file  . Years of education: Not on file  . Highest education level: Not on file  Occupational History  . Not on file  Social Needs  . Financial resource strain: Not on file  . Food insecurity:    Worry: Not on file    Inability: Not on file  . Transportation needs:    Medical: Not on file    Non-medical: Not on file  Tobacco Use  . Smoking status: Never Smoker  . Smokeless tobacco: Never Used  Substance and Sexual Activity  . Alcohol use: No  . Drug use: Yes    Types: Marijuana  . Sexual activity: Not Currently  Lifestyle  . Physical activity:    Days per week: Not on file    Minutes per session: Not on file  . Stress: Not on file  Relationships   . Social connections:    Talks on phone: Not on file    Gets together: Not on file    Attends religious service: Not on file    Active member of club or organization: Not on file    Attends meetings of clubs or organizations: Not on file    Relationship status: Not on file  Other Topics Concern  . Not on file  Social History Narrative  . Not on file   Additional Social History:    History of alcohol / drug use?: No history of alcohol / drug abuse                    Sleep: Fair  Appetite:  Fair  Current Medications: Current Facility-Administered Medications  Medication Dose Route Frequency Provider Last Rate Last Dose  . acetaminophen (TYLENOL) tablet 650 mg  650 mg Oral Q6H PRN [name] Loughner B, MD   650 mg at 05/23/18 2350  . alum & mag hydroxide-simeth (MAALOX/MYLANTA) 200-200-20 MG/5ML suspension 30 mL  30 mL Oral Q4H PRN [name] Winton B, MD      . cloZAPine (CLOZARIL) tablet 300 mg  300 mg Oral QHS [name] Decatur B, MD   300 mg at 05/23/18 2158  . divalproex (DEPAKOTE) DR tablet 500 mg  500 mg Oral Q12H [name] Self B, MD   500 mg at 05/24/18 0900  . fluvoxaMINE (LUVOX) tablet 25 mg  25 mg Oral QHS [name] Oehlert B, MD   25 mg at 05/23/18 2158  . haloperidol (HALDOL) tablet 10 mg  10 mg Oral QHS [name] Stambaugh B, MD   10 mg at 05/23/18 2159  . Influenza vac split quadrivalent PF (FLUARIX) injection 0.5 mL  0.5 mL Intramuscular Tomorrow-1000 [name] Bialy B, MD      . magnesium hydroxide (MILK OF MAGNESIA) suspension 30 mL  30 mL Oral Daily PRN [name] Demarest B, MD   30 mL at 05/21/18 2143  . metFORMIN (GLUCOPHAGE) tablet 500 mg  500 mg Oral Q breakfast [name] Polyak B, MD   500 mg at 05/24/18 0900  . metoprolol tartrate (LOPRESSOR) tablet 12.5 mg  12.5 mg Oral BID [name] Bunt B, MD   12.5 mg at 05/24/18 0900  . nicotine (NICODERM CQ - dosed in mg/24 hours) patch 21 mg  21 mg Transdermal Daily [name] Glasheen  B, MD   21 mg at 05/24/18 0900  . temazepam (RESTORIL) capsule 30 mg  30 mg Oral QHS [name] Mynhier B, MD   30 mg at 05/23/18 2157    Lab Results: No results found for this or any previous visit (from the past 48 hour(s)).  Blood Alcohol level:  Lab Results  Component Value Date   ETH <10 05/19/2018   ETH <10 03/06/2018    Metabolic Disorder Labs: Lab Results  Component Value Date   HGBA1C 4.9 03/08/2018   MPG 93.93 03/08/2018   MPG 85.32 12/30/2017   No results found for: PROLACTIN Lab Results  Component Value Date   CHOL 238 (H) 03/08/2018   TRIG 69 03/08/2018   HDL 56 03/08/2018   CHOLHDL 4.3 03/08/2018   VLDL 14 03/08/2018   LDLCALC 168 (H) 03/08/2018   LDLCALC 97 12/30/2017    Physical Findings: AIMS:  , ,  ,  ,    CIWA:    COWS:     Musculoskeletal: Strength & Muscle Tone: within normal limits Gait & Station: normal Patient leans: N/A  Psychiatric Specialty Exam: Physical Exam  ROS  Blood pressure (!) 141/87, pulse 85, temperature 97.7 F (36.5 C), temperature source Oral, resp. rate 16, height 6\' 1"  (1.854 m), weight 98.9 kg, SpO2 98 %.Body mass index is 28.76 kg/m.  General Appearance: Casual  Eye Contact:  Poor  Speech:  Clear and Coherent  Volume:  Normal  Mood:  Euthymic  Affect:  Blunt  Thought Process:  Goal Directed and Descriptions of Associations: Intact  Orientation:  Full (Time, Place, and Person)  Thought Content:  WDL  Suicidal Thoughts:  No  Homicidal Thoughts:  No  Memory:  Immediate;   Fair Recent;   Fair Remote;   Fair  Judgement:  Poor  Insight:  Lacking  Psychomotor Activity:  Psychomotor Retardation  Concentration:  Concentration: Fair and Attention Span: Fair  Recall:  [employer] of Knowledge:  Fair  Language:  Fair  Akathisia:  No  Handed:  Right  AIMS (if indicated):     Assets:  Communication Skills Desire for Improvement Financial Resources/Insurance Housing Physical Health Resilience Social Support   ADL's:  Intact  Cognition:  WNL  Sleep:  Number of Hours: 5.3     Treatment Plan Summary: Daily contact with patient to assess and evaluate symptoms and progress in treatment and Medication management   [name] [name] is  a 22 year old male with a history of schuizophrenia admitted for psychotic break in the context of medication noncompliance.We were incorrect. Clozapine level is over 1,000.  #Psychosis -continueClozapine 300 mg nightly -lower Luvox to 25mg  nightly for augmentation, -discontinueTrilafon -continueHaldol 10 mg nightly -offer haldol decanoate injections to improve compliance, refuses -continue Depakote 500 mg BID for affective component and seizure prevention, level tomorrow  #Insomnia -discontinue Trazodone  -lower Restoril 15 mg nightly  #Tachycardia -continue Metoprolol 12.5 mg BID  #Metabolic syndrom prevention -Metformin 500 mg BID  #Labs -obtained recently -EKGreviewed, NSR with QTc 404  #Disposition  -discharge with the mother -follow up with [name] ACT team  Kristine Linea, MD 05/24/2018, 4:16 PM

## 2018-05-24 NOTE — Plan of Care (Signed)
  Problem: Coping: Goal: Ability to identify and develop effective coping behavior will improve Outcome: Progressing  Patient is coping effectively appears less anxious, interacting appropriately with peers and staff.

## 2018-05-25 LAB — CBC WITH DIFFERENTIAL/PLATELET
Abs Immature Granulocytes: 0.02 10*3/uL (ref 0.00–0.07)
Basophils Absolute: 0 10*3/uL (ref 0.0–0.1)
Basophils Relative: 0 %
Eosinophils Absolute: 0 10*3/uL (ref 0.0–0.5)
Eosinophils Relative: 0 %
HCT: 44.3 % (ref 39.0–52.0)
Hemoglobin: 15.4 g/dL (ref 13.0–17.0)
Immature Granulocytes: 0 %
Lymphocytes Relative: 42 %
Lymphs Abs: 2 10*3/uL (ref 0.7–4.0)
MCH: 28.3 pg (ref 26.0–34.0)
MCHC: 34.8 g/dL (ref 30.0–36.0)
MCV: 81.3 fL (ref 80.0–100.0)
Monocytes Absolute: 0.5 10*3/uL (ref 0.1–1.0)
Monocytes Relative: 10 %
Neutro Abs: 2.2 10*3/uL (ref 1.7–7.7)
Neutrophils Relative %: 48 %
Platelets: 243 10*3/uL (ref 150–400)
RBC: 5.45 MIL/uL (ref 4.22–5.81)
RDW: 12.9 % (ref 11.5–15.5)
WBC: 4.7 10*3/uL (ref 4.0–10.5)
nRBC: 0 % (ref 0.0–0.2)

## 2018-05-25 LAB — AMMONIA: AMMONIA: 53 umol/L — AB (ref 9–35)

## 2018-05-25 LAB — VALPROIC ACID LEVEL: Valproic Acid Lvl: 53 ug/mL (ref 50.0–100.0)

## 2018-05-25 MED ORDER — TRAZODONE HCL 100 MG PO TABS: 100.0000 mg | ORAL_TABLET | Freq: Every evening | ORAL | 1 refills | Status: DC | PRN

## 2018-05-25 MED ORDER — DIVALPROEX SODIUM 500 MG PO DR TAB: 500.0000 mg | DELAYED_RELEASE_TABLET | Freq: Two times a day (BID) | ORAL | 1 refills | Status: DC

## 2018-05-25 MED ORDER — METOPROLOL TARTRATE 25 MG PO TABS: 12.5000 mg | ORAL_TABLET | Freq: Two times a day (BID) | ORAL | 1 refills | Status: DC

## 2018-05-25 MED ORDER — FLUVOXAMINE MALEATE 25 MG PO TABS: 25.0000 mg | ORAL_TABLET | Freq: Every day | ORAL | 1 refills | Status: DC

## 2018-05-25 MED ORDER — HALOPERIDOL 10 MG PO TABS: 10.0000 mg | ORAL_TABLET | Freq: Every day | ORAL | 1 refills | Status: DC

## 2018-05-25 MED ORDER — METOPROLOL TARTRATE 25 MG PO TABS: 25.0000 mg | ORAL_TABLET | Freq: Two times a day (BID) | ORAL | Status: DC

## 2018-05-25 MED ORDER — METFORMIN HCL 500 MG PO TABS: 500.0000 mg | ORAL_TABLET | Freq: Every day | ORAL | 1 refills | Status: DC

## 2018-05-25 MED ORDER — CLOZAPINE 100 MG PO TABS: 300.0000 mg | ORAL_TABLET | Freq: Every day | ORAL | Status: DC

## 2018-05-25 NOTE — B[facility] Suicide Risk Assessment (Addendum)
[facility] Discharge Suicide Risk Assessment   Principal Problem: Schizoaffective disorder, bipolar type [facility]) Discharge Diagnoses:  Patient Active Problem List   Diagnosis Date Noted  . Schizoaffective disorder, bipolar type (HCC) [F25.0] 12/30/2017    Priority: High  . Hypertension [I10] 03/06/2018  . Noncompliance [Z91.19] 12/30/2017  . Aggressive behavior of adolescent [R46.89] 10/24/2015    Total Time spent with patient: 20 minutes  Musculoskeletal: Strength & Muscle Tone: within normal limits Gait & Station: normal Patient leans: N/A  Psychiatric Specialty Exam: Review of Systems  Neurological: Negative.   Psychiatric/Behavioral: Negative.   All other systems reviewed and are negative.   Blood pressure (!) 141/93, pulse 100, temperature 97.7 F (36.5 C), temperature source Oral, resp. rate 16, height 6\' 1"  (1.854 m), weight 98.9 kg, SpO2 98 %.Body mass index is 28.76 kg/m.  General Appearance: Casual  Eye Contact::  Good  Speech:  Clear and Coherent409  Volume:  Normal  Mood:  Euthymic  Affect:  Blunt  Thought Process:  Goal Directed and Descriptions of Associations: Intact  Orientation:  Full (Time, Place, and Person)  Thought Content:  WDL  Suicidal Thoughts:  No  Homicidal Thoughts:  No  Memory:  Immediate;   Fair Recent;   Fair Remote;   Fair  Judgement:  Impaired  Insight:  Shallow  Psychomotor Activity:  Normal  Concentration:  Fair  Recall:  [employer] of Knowledge:Fair  Language: Fair  Akathisia:  No  Handed:  Right  AIMS (if indicated):     Assets:  Communication Skills Desire for Improvement Financial Resources/Insurance Housing Physical Health Resilience Social Support  Sleep:  Number of Hours: 7  Cognition: WNL  ADL's:  Intact   Mental Status Per Nursing Assessment::   On Admission:  NA  Demographic Factors:  Male, Adolescent or young adult and Unemployed  Loss Factors: Financial problems/change in socioeconomic status  Historical  Factors: Impulsivity  Risk Reduction Factors:   Sense of responsibility to family, Living with another person, especially a relative, Positive social support and Positive therapeutic relationship  Continued Clinical Symptoms:  Schizophrenia:   Less than 75 years old  Cognitive Features That Contribute To Risk:  None    Suicide Risk:  Minimal: No identifiable suicidal ideation.  Patients presenting with no risk factors but with morbid ruminations; may be classified as minimal risk based on the severity of the depressive symptoms    Plan Of Care/Follow-up recommendations:  Activity:  as tolerated Diet:  regular Other:  keep follow up appointment  Kristine Linea, MD 05/25/2018, 11:13 AM

## 2018-05-25 NOTE — Progress Notes (Signed)
Clozaril Monitoring  22 yo on Clozapine  11/04  ANC 2.2  Lab submitted to Clozapine [facility]. Monitor ANC every week while inpatient. Next ANC 06/01/18  Bari Mantis PharmD Clinical Pharmacist 05/25/2018

## 2018-05-25 NOTE — Progress Notes (Signed)
D: Pt A & O X 3. Denies SI, HI, AVH and pain at this time. Presents with flat affect and anxious mood. Pt d/c home as ordered. Picked up in lobby by his mother. A: D/C instructions reviewed with pt including  follow up appointment with [facility]; compliance encouraged. All belongings from locker given to pt at time of departure. Scheduled medications given with verbal education and effects monitored. Safety checks maintained without incident till time of d/c.  R: Pt receptive to care. Compliant with medications when offered. Denies adverse drug reactions when assessed. Verbalized understanding related to d/c instructions. Pt in agreement with items received from locker. Ambulatory with a steady gait. Appears to be in no physical distress at time of departure.

## 2018-05-25 NOTE — Progress Notes (Signed)
  [facility] Adult Case Management Discharge Plan :  Will you be returning to the same living situation after discharge:  Yes,  Home with family At discharge, do you have transportation home?: Yes,  Mother coming at dischrge Do you have the ability to pay for your medications: Yes,  Referred to a provider who can assist  Release of information consent forms completed and in the chart;  Patient's signature needed at discharge.  Patient to Follow up at: Follow-up Information    [address] [facility] Go on 05/27/2018.   Why:  Please follow up with your ACTT team on Wednesday May 27, 2018 at 10am. Thank you. Contact information: [address] [place] [address] [place] [address] [phone]           Next level of care provider has access to [facility] Link:no  Safety Planning and Suicide Prevention discussed: Yes,  Completed with patient  Have you used any form of tobacco in the last 30 days? (Cigarettes, Smokeless Tobacco, Cigars, and/or Pipes): No  Has patient been referred to the Quitline?: N/A patient is not a smoker  Patient has been referred for addiction treatment: N/A  [name], LCSW 05/25/2018, 11:17 AM

## 2018-05-25 NOTE — Discharge Summary (Addendum)
Physician Discharge Summary Note  Patient:  [name] [name] is an 22 y.o., male MRN:  [id number] DOB:  January 19, 1996 Patient phone:  [phone] (home)  Patient address:   [address] [place] [address],  Total Time spent with patient: 20 minutes plus 15 min on care coordination  Date of Admission:  05/19/2018 Date of Discharge: 05/25/2018  Reason for Admission:  Psychotic break.  History of Present Illness:   Identifying data. [name] [name] is a 22 year old male with a history of schizophrenia.  Chief complaint. "I called the police. They did not believe me and brought me here."  History of present illness. The patient was brought to the hospital after calling police repeatedly complaining that someone, his friend's father, has been harassing him on social media. He also demanded MRI for "a hole in my head" believing that someone broke into his house and performed a lobotomy.  He denies any symptoms of depression, anxiety or psychosis. He is in bed under blankets not maintaining eye contact. He reports good medication compliance. He follows up with [facility] ACT team and has been on Clozapine, level pending.  Past psychiatric history. Diagnosed with treatment resistant schizophrenia. Several hospitalizations. Responded well to Clozapine but compliance a problem.   Family psychiatric history. None.  Social history. The family came fron [place] as refugees. He graduated from high school but his education was interrupted by illness. Lives with his mom and brother. We recommended SSDI application and guardianship. Unsure if the family took action.   Principal Problem: Schizoaffective disorder, bipolar type [facility]) Discharge Diagnoses: Patient Active Problem List   Diagnosis Date Noted  . Schizoaffective disorder, bipolar type (HCC) [F25.0] 12/30/2017    Priority: High  . Hypertension [I10] 03/06/2018  . Noncompliance [Z91.19] 12/30/2017  . Aggressive behavior of  adolescent [R46.89] 10/24/2015   Past Medical History:  Past Medical History:  Diagnosis Date  . Asthma   . Depression   . Psychosis [facility])     Past Surgical History:  Procedure Laterality Date  . BACK SURGERY     Family History: History reviewed. No pertinent family history.  Social History:  Social History   Substance and Sexual Activity  Alcohol Use No     Social History   Substance and Sexual Activity  Drug Use Yes  . Types: Marijuana    Social History   Socioeconomic History  . Marital status: Single    Spouse name: Not on file  . Number of children: Not on file  . Years of education: Not on file  . Highest education level: Not on file  Occupational History  . Not on file  Social Needs  . Financial resource strain: Not on file  . Food insecurity:    Worry: Not on file    Inability: Not on file  . Transportation needs:    Medical: Not on file    Non-medical: Not on file  Tobacco Use  . Smoking status: Never Smoker  . Smokeless tobacco: Never Used  Substance and Sexual Activity  . Alcohol use: No  . Drug use: Yes    Types: Marijuana  . Sexual activity: Not Currently  Lifestyle  . Physical activity:    Days per week: Not on file    Minutes per session: Not on file  . Stress: Not on file  Relationships  . Social connections:    Talks on phone: Not on file    Gets together: Not on file    Attends  religious service: Not on file    Active member of club or organization: Not on file    Attends meetings of clubs or organizations: Not on file    Relationship status: Not on file  Other Topics Concern  . Not on file  Social History Narrative  . Not on file    Hospital Course:    [name] [name] is a 22 year old male with a history of schizophrenia admitted for psychotic break in spite of good medication complinace as evidenced by high Clozapine level.  #Psychosis -continueClozapine 300 mg nightly, Clo+Nclo -lower Luvox to 25mg  nightly for  augmentation, -discontinueTrilafon -continueHaldol 10 mg nightly -offer haldol decanoate injections to improve compliance, refuses -continue Depakote 500 mg BID for affective component and seizure prevention, level tomorrow  #Insomnia -discontinue Trazodone  -lowerRestoril15mg  nightly  #Tachycardia -continue Metoprolol 12.5 mg BID  #Metabolic syndrom prevention -Metformin 500 mg BID  #Labs -obtained recently -EKGreviewed, NSR with QTc 404  #Disposition  -discharge with the mother -follow up with [name] team  Physical Findings: AIMS:  , ,  ,  ,    CIWA:    COWS:     Musculoskeletal: Strength & Muscle Tone: within normal limits Gait & Station: normal Patient leans: N/A  Psychiatric Specialty Exam: Physical Exam  Nursing note and vitals reviewed. Psychiatric: He has a normal mood and affect. His speech is normal and behavior is normal. Thought content normal. Cognition and memory are normal. He expresses impulsivity.    Review of Systems  Neurological: Negative.   Psychiatric/Behavioral: Negative.   All other systems reviewed and are negative.   Blood pressure (!) 141/93, pulse 100, temperature 97.7 F (36.5 C), temperature source Oral, resp. rate 16, height 6\' 1"  (1.854 m), weight 98.9 kg, SpO2 98 %.Body mass index is 28.76 kg/m.  General Appearance: Casual  Eye Contact:  Good  Speech:  Clear and Coherent  Volume:  Normal  Mood:  Euthymic  Affect:  Blunt  Thought Process:  Goal Directed and Descriptions of Associations: Intact  Orientation:  Full (Time, Place, and Person)  Thought Content:  WDL  Suicidal Thoughts:  No  Homicidal Thoughts:  No  Memory:  Immediate;   Fair Recent;   Fair Remote;   Fair  Judgement:  Impaired  Insight:  Shallow  Psychomotor Activity:  Normal  Concentration:  Concentration: Fair and Attention Span: Fair  Recall:  [employer] of Knowledge:  Fair  Language:  Fair  Akathisia:  No  Handed:  Right  AIMS  (if indicated):     Assets:  Communication Skills Desire for Improvement Financial Resources/Insurance Housing Physical Health Resilience Social Support  ADL's:  Intact  Cognition:  WNL  Sleep:  Number of Hours: 7     Have you used any form of tobacco in the last 30 days? (Cigarettes, Smokeless Tobacco, Cigars, and/or Pipes): No  Has this patient used any form of tobacco in the last 30 days? (Cigarettes, Smokeless Tobacco, Cigars, and/or Pipes) Yes, Yes, A prescription for an FDA-approved tobacco cessation medication was offered at discharge and the patient refused  Blood Alcohol level:  Lab Results  Component Value Date   [facility] <10 05/19/2018   ETH <10 03/06/2018    Metabolic Disorder Labs:  Lab Results  Component Value Date   HGBA1C 4.9 03/08/2018   MPG 93.93 03/08/2018   MPG 85.32 12/30/2017   No results found for: PROLACTIN Lab Results  Component Value Date   CHOL 238 (H) 03/08/2018  TRIG 69 03/08/2018   HDL 56 03/08/2018   CHOLHDL 4.3 03/08/2018   VLDL 14 03/08/2018   LDLCALC 168 (H) 03/08/2018   LDLCALC 97 12/30/2017    See Psychiatric Specialty Exam and Suicide Risk Assessment completed by Attending Physician prior to discharge.  Discharge destination:  Home  Is patient on multiple antipsychotic therapies at discharge:  Yes,   Do you recommend tapering to monotherapy for antipsychotics?  No   Has Patient had three or more failed trials of antipsychotic monotherapy by history:  Yes,   Antipsychotic medications that previously failed include:   1.  zyprexa., 2.  seroquel. and 3.  invega.  Recommended Plan for Multiple Antipsychotic Therapies: Second antipsychotic is Clozapine.  Reason for adding Clozapine inadequate response to a single agent  Discharge Instructions    Diet - low sodium heart healthy   Complete by:  As directed    Increase activity slowly   Complete by:  As directed      Allergies as of 05/25/2018   No Known Allergies     Medication  List    STOP taking these medications   LORazepam 0.5 MG tablet Commonly known as:  ATIVAN   perphenazine 8 MG tablet Commonly known as:  TRILAFON     TAKE these medications     Indication  cloZAPine 100 MG tablet Commonly known as:  CLOZARIL Take 3 tablets (300 mg total) by mouth at bedtime.  Indication:  Schizophrenia that does Not Respond to Usual Drug Therapy   divalproex 500 MG DR tablet Commonly known as:  DEPAKOTE Take 1 tablet (500 mg total) by mouth every 12 (twelve) hours.  Indication:  Schizophrenia   fluvoxaMINE 25 MG tablet Commonly known as:  LUVOX Take 1 tablet (25 mg total) by mouth at bedtime. What changed:    medication strength  how much to take  Indication:  augmentation of Clozapine   haloperidol 10 MG tablet Commonly known as:  HALDOL Take 1 tablet (10 mg total) by mouth at bedtime.  Indication:  Schizophrenia   metFORMIN 500 MG tablet Commonly known as:  GLUCOPHAGE Take 1 tablet (500 mg total) by mouth daily with breakfast.  Indication:  Antipsychotic Therapy-Induced Weight Gain   metoprolol tartrate 25 MG tablet Commonly known as:  LOPRESSOR Take 0.5 tablets (12.5 mg total) by mouth 2 (two) times daily.  Indication:  Supraventricular Tachycardia   traZODone 100 MG tablet Commonly known as:  DESYREL Take 1 tablet (100 mg total) by mouth at bedtime as needed for sleep.  Indication:  Trouble Sleeping      Follow-up Information    [facility] Go on 05/27/2018.   Why:  Please follow up with your ACTT team on Wednesday May 27, 2018 at 10am. Thank you. Contact information: [address] [place] [address] [phone]           Follow-up recommendations:  Activity:  as tolerated Diet:  regular Other:  keep follow up appointments  Comments:    Signed: Kristine Linea, MD 05/25/2018, 11:21 AM

## 2018-08-02 ENCOUNTER — Emergency Department
Admission: EM | Admit: 2018-08-02 | Discharge: 2018-08-03 | Disposition: A | Discharge status: Other Acute Inpatient Hospital | Payer: Medicaid Other | Attending: Emergency Medicine | Admitting: Emergency Medicine

## 2018-08-02 ENCOUNTER — Other Ambulatory Visit: Payer: Self-pay

## 2018-08-02 DIAGNOSIS — F22 Delusional disorders: Secondary | ICD-10-CM

## 2018-08-02 DIAGNOSIS — F329 Major depressive disorder, single episode, unspecified: Secondary | ICD-10-CM | POA: Insufficient documentation

## 2018-08-02 DIAGNOSIS — F259 Schizoaffective disorder, unspecified: Secondary | ICD-10-CM | POA: Insufficient documentation

## 2018-08-02 DIAGNOSIS — I10 Essential (primary) hypertension: Secondary | ICD-10-CM | POA: Diagnosis not present

## 2018-08-02 DIAGNOSIS — J45909 Unspecified asthma, uncomplicated: Secondary | ICD-10-CM | POA: Insufficient documentation

## 2018-08-02 DIAGNOSIS — Z79899 Other long term (current) drug therapy: Secondary | ICD-10-CM | POA: Diagnosis not present

## 2018-08-02 LAB — CBC
HCT: 44.8 % (ref 39.0–52.0)
Hemoglobin: 15.8 g/dL (ref 13.0–17.0)
MCH: 28.1 pg (ref 26.0–34.0)
MCHC: 35.3 g/dL (ref 30.0–36.0)
MCV: 79.7 fL — AB (ref 80.0–100.0)
PLATELETS: 310 10*3/uL (ref 150–400)
RBC: 5.62 MIL/uL (ref 4.22–5.81)
RDW: 12 % (ref 11.5–15.5)
WBC: 6.4 10*3/uL (ref 4.0–10.5)
nRBC: 0 % (ref 0.0–0.2)

## 2018-08-02 LAB — COMPREHENSIVE METABOLIC PANEL
ALK PHOS: 99 U/L (ref 38–126)
ALT: 42 U/L (ref 0–44)
AST: 28 U/L (ref 15–41)
Albumin: 5.1 g/dL — ABNORMAL HIGH (ref 3.5–5.0)
Anion gap: 12 (ref 5–15)
BUN: 15 mg/dL (ref 6–20)
CALCIUM: 9.7 mg/dL (ref 8.9–10.3)
CO2: 22 mmol/L (ref 22–32)
CREATININE: 1.03 mg/dL (ref 0.61–1.24)
Chloride: 105 mmol/L (ref 98–111)
Glucose, Bld: 81 mg/dL (ref 70–99)
Potassium: 3.6 mmol/L (ref 3.5–5.1)
Sodium: 139 mmol/L (ref 135–145)
Total Bilirubin: 2.6 mg/dL — ABNORMAL HIGH (ref 0.3–1.2)
Total Protein: 8.3 g/dL — ABNORMAL HIGH (ref 6.5–8.1)

## 2018-08-02 LAB — ETHANOL

## 2018-08-02 LAB — SALICYLATE LEVEL

## 2018-08-02 LAB — ACETAMINOPHEN LEVEL: Acetaminophen (Tylenol), Serum: <10 ug/mL — ABNORMAL LOW (ref 10–30)

## 2018-08-02 NOTE — ED Triage Notes (Signed)
Patient here by [facility] under IVC.  IVC papers states patient is not taking his medications, fled his home and felt to be a danger to himself.  When asked why he is here patient states "I don't know".

## 2018-08-02 NOTE — ED Provider Notes (Signed)
[facility] Emergency Department Provider Note   ____________________________________________   First MD Initiated Contact with Patient 08/02/18 2225     (approximate)  I have reviewed the triage vital signs and the nursing notes.   HISTORY  Chief Complaint Psychiatric Evaluation    HPI [name] [name] is a 23 y.o. male with a past history of psychosis.  He is refusing to take his meds and has run away from his residence.  He is acting paranoid by history.  When I speak to him he is not making a lot of sounds.  Seems to be scared and is covering himself with a blanket.   Past Medical History:  Diagnosis Date  . Asthma   . Depression   . Psychosis [facility](HCC)     Patient Active Problem List   Diagnosis Date Noted  . Hypertension 03/06/2018  . Schizoaffective disorder, bipolar type (HCC) 12/30/2017  . Noncompliance 12/30/2017  . Aggressive behavior of adolescent 10/24/2015    Past Surgical History:  Procedure Laterality Date  . BACK SURGERY      Prior to Admission medications   Medication Sig Start Date End Date Taking? Authorizing Provider  cloZAPine (CLOZARIL) 100 MG tablet Take 3 tablets (300 mg total) by mouth at bedtime. 05/25/18   [name], Braulio ConteJolanta B, MD  divalproex (DEPAKOTE) 500 MG DR tablet Take 1 tablet (500 mg total) by mouth every 12 (twelve) hours. 05/25/18   [name], [name], MD  fluvoxaMINE (LUVOX) 25 MG tablet Take 1 tablet (25 mg total) by mouth at bedtime. 05/25/18   [name], [name], MD  haloperidol (HALDOL) 10 MG tablet Take 1 tablet (10 mg total) by mouth at bedtime. 05/25/18   [name], Braulio ConteJolanta B, MD  metFORMIN (GLUCOPHAGE) 500 MG tablet Take 1 tablet (500 mg total) by mouth daily with breakfast. 05/25/18   [name], [name], MD  metoprolol tartrate (LOPRESSOR) 25 MG tablet Take 0.5 tablets (12.5 mg total) by mouth 2 (two) times daily. 05/25/18   [name], Braulio ConteJolanta B, MD  traZODone (DESYREL) 100 MG tablet Take 1  tablet (100 mg total) by mouth at bedtime as needed for sleep. 05/25/18   [name], Ellin GoodieJolanta B, MD    Allergies Patient has no known allergies.  No family history on file.  Social History Social History   Tobacco Use  . Smoking status: Never Smoker  . Smokeless tobacco: Never Used  Substance Use Topics  . Alcohol use: No  . Drug use: Yes    Types: Marijuana    Review of Systems  Constitutional: No fever/chills Eyes: No visual changes. ENT: No sore throat. Cardiovascular: Denies chest pain. Respiratory: Denies shortness of breath. Gastrointestinal: No abdominal pain.  No nausea, no vomiting.  No diarrhea.  No constipation. Genitourinary: Negative for dysuria. Musculoskeletal: Negative for back pain. Skin: Negative for rash. Neurological: Negative for headaches, focal weakness    ____________________________________________   PHYSICAL EXAM:  VITAL SIGNS: ED Triage Vitals  Enc Vitals Group     BP 08/02/18 2015 (!) 145/93     Pulse Rate 08/02/18 2015 (!) 107     Resp 08/02/18 2015 20     Temp 08/02/18 2015 98.4 F (36.9 C)     Temp Source 08/02/18 2015 Oral     SpO2 08/02/18 2015 98 %     Weight 08/02/18 2016 210 lb (95.3 kg)     Height 08/02/18 2016 6' (1.829 m)     Head Circumference --      Peak  Flow --      Pain Score 08/02/18 2016 0     Pain Loc --      Pain Edu? --      Excl. in GC? --     Constitutional: Alert and oriented.  Looks very anxious and scared Eyes: Conjunctivae are normal.  Head: Atraumatic. Nose: No congestion/rhinnorhea. Mouth/Throat: Mucous membranes are moist.  Oropharynx non-erythematous. Neck: No stridor.  Cardiovascular: Normal rate, regular rhythm. Grossly normal heart sounds.  Good peripheral circulation. Respiratory: Normal respiratory effort.  No retractions. Lungs CTAB. Gastrointestinal: Soft and nontender. No distention. No abdominal bruits. No CVA tenderness. Musculoskeletal: No lower extremity tenderness nor edema.     Neurologic:  Normal speech and language. No gross focal neurologic deficits are appreciated.  Skin:  Skin is warm, dry and intact. No rash noted. .  ____________________________________________   LABS (all labs ordered are listed, but only abnormal results are displayed)  Labs Reviewed  COMPREHENSIVE METABOLIC PANEL - Abnormal; Notable for the following components:      Result Value   Total Protein 8.3 (*)    Albumin 5.1 (*)    Total Bilirubin 2.6 (*)    All other components within normal limits  ACETAMINOPHEN LEVEL - Abnormal; Notable for the following components:   Acetaminophen (Tylenol), Serum <10 (*)    All other components within normal limits  CBC - Abnormal; Notable for the following components:   MCV 79.7 (*)    All other components within normal limits  ETHANOL  SALICYLATE LEVEL  URINE DRUG SCREEN, QUALITATIVE ([facility] ONLY)   ____________________________________________  EKG  ____________________________________________  RADIOLOGY  ED MD interpretation  Official radiology report(s): No results found.  ____________________________________________   PROCEDURES  Procedure(s) performed:    Procedures  Critical Care performed:   ____________________________________________   INITIAL IMPRESSION / ASSESSMENT AND PLAN / ED COURSE          ____________________________________________   FINAL CLINICAL IMPRESSION(S) / ED DIAGNOSES  Final diagnoses:  Paranoid behavior [facility])     ED Discharge Orders    None       Note:  This document was prepared using Dragon voice recognition software and may include unintentional dictation errors.    Arnaldo Natal, MD 08/02/18 [phone]

## 2018-08-03 ENCOUNTER — Inpatient Hospital Stay
Admission: AD | Admit: 2018-08-03 | Discharge: 2018-08-12 | DRG: 885 | Disposition: A | Discharge status: Home / Self Care | Payer: Medicaid Other | Source: Intra-hospital | Attending: Psychiatry | Admitting: Psychiatry

## 2018-08-03 ENCOUNTER — Encounter: Payer: Self-pay | Admitting: Psychiatry

## 2018-08-03 DIAGNOSIS — F259 Schizoaffective disorder, unspecified: Secondary | ICD-10-CM | POA: Diagnosis not present

## 2018-08-03 DIAGNOSIS — I471 Supraventricular tachycardia: Secondary | ICD-10-CM | POA: Diagnosis present

## 2018-08-03 DIAGNOSIS — Z5329 Procedure and treatment not carried out because of patient's decision for other reasons: Secondary | ICD-10-CM | POA: Diagnosis present

## 2018-08-03 DIAGNOSIS — F250 Schizoaffective disorder, bipolar type: Principal | ICD-10-CM | POA: Diagnosis present

## 2018-08-03 DIAGNOSIS — I10 Essential (primary) hypertension: Secondary | ICD-10-CM | POA: Diagnosis present

## 2018-08-03 DIAGNOSIS — Z9119 Patient's noncompliance with other medical treatment and regimen: Secondary | ICD-10-CM | POA: Diagnosis not present

## 2018-08-03 DIAGNOSIS — F940 Selective mutism: Secondary | ICD-10-CM | POA: Diagnosis present

## 2018-08-03 DIAGNOSIS — Z5181 Encounter for therapeutic drug level monitoring: Secondary | ICD-10-CM | POA: Diagnosis not present

## 2018-08-03 LAB — DIFFERENTIAL
BASOS ABS: 0 10*3/uL (ref 0.0–0.1)
Basophils Relative: 0 %
EOS ABS: 0.2 10*3/uL (ref 0.0–0.5)
Eosinophils Relative: 2 %
Lymphocytes Relative: 26 %
Lymphs Abs: 1.7 10*3/uL (ref 0.7–4.0)
MONO ABS: 0.5 10*3/uL (ref 0.1–1.0)
MONOS PCT: 8 %
NEUTROS ABS: 4.2 10*3/uL (ref 1.7–7.7)
Neutrophils Relative %: 64 %

## 2018-08-03 MED ORDER — METFORMIN HCL 500 MG PO TABS
500.0000 mg | ORAL_TABLET | Freq: Every day | ORAL | Status: DC
  Administered 2018-08-03: 500 mg via ORAL
  Filled 2018-08-03: qty 1

## 2018-08-03 MED ORDER — DIPHENHYDRAMINE HCL 25 MG PO CAPS: 50.0000 mg | ORAL_CAPSULE | Freq: Every evening | ORAL | Status: DC | PRN

## 2018-08-03 MED ORDER — CLOZAPINE 100 MG PO TABS
300.0000 mg | ORAL_TABLET | Freq: Every day | ORAL | Status: DC
  Administered 2018-08-05 – 2018-08-11 (×6): 300 mg via ORAL
  Filled 2018-08-03 (×8): qty 3

## 2018-08-03 MED ORDER — CLOZAPINE 100 MG PO TABS: 300.0000 mg | ORAL_TABLET | Freq: Every day | ORAL | Status: DC

## 2018-08-03 MED ORDER — ALUM & MAG HYDROXIDE-SIMETH 200-200-20 MG/5ML PO SUSP: 30.0000 mL | ORAL | Status: DC | PRN

## 2018-08-03 MED ORDER — FLUVOXAMINE MALEATE 50 MG PO TABS
25.0000 mg | ORAL_TABLET | Freq: Every day | ORAL | Status: DC
  Filled 2018-08-03: qty 1

## 2018-08-03 MED ORDER — DIVALPROEX SODIUM 500 MG PO DR TAB
500.0000 mg | DELAYED_RELEASE_TABLET | Freq: Two times a day (BID) | ORAL | Status: DC
  Administered 2018-08-03: 500 mg via ORAL
  Filled 2018-08-03: qty 1

## 2018-08-03 MED ORDER — HALOPERIDOL 5 MG PO TABS: 10.0000 mg | ORAL_TABLET | Freq: Every day | ORAL | Status: DC

## 2018-08-03 MED ORDER — LORAZEPAM 1 MG PO TABS: 1.0000 mg | ORAL_TABLET | ORAL | Status: DC | PRN

## 2018-08-03 MED ORDER — ACETAMINOPHEN 325 MG PO TABS: 650.0000 mg | ORAL_TABLET | Freq: Four times a day (QID) | ORAL | Status: DC | PRN

## 2018-08-03 MED ORDER — FLUVOXAMINE MALEATE 50 MG PO TABS
25.0000 mg | ORAL_TABLET | Freq: Every day | ORAL | Status: DC
  Administered 2018-08-05 – 2018-08-11 (×6): 25 mg via ORAL
  Filled 2018-08-03 (×8): qty 1

## 2018-08-03 MED ORDER — METFORMIN HCL 500 MG PO TABS
500.0000 mg | ORAL_TABLET | Freq: Every day | ORAL | Status: DC
  Administered 2018-08-04 – 2018-08-12 (×9): 500 mg via ORAL
  Filled 2018-08-03 (×9): qty 1

## 2018-08-03 MED ORDER — MAGNESIUM HYDROXIDE 400 MG/5ML PO SUSP: 30.0000 mL | Freq: Every day | ORAL | Status: DC | PRN

## 2018-08-03 MED ORDER — TRAZODONE HCL 100 MG PO TABS
100.0000 mg | ORAL_TABLET | Freq: Every evening | ORAL | Status: DC | PRN
  Administered 2018-08-05 – 2018-08-11 (×3): 100 mg via ORAL
  Filled 2018-08-03 (×4): qty 1

## 2018-08-03 MED ORDER — LORAZEPAM 1 MG PO TABS
1.0000 mg | ORAL_TABLET | ORAL | Status: DC | PRN
  Filled 2018-08-03: qty 1

## 2018-08-03 MED ORDER — METOPROLOL TARTRATE 25 MG PO TABS
12.5000 mg | ORAL_TABLET | Freq: Two times a day (BID) | ORAL | Status: DC
  Administered 2018-08-04 – 2018-08-10 (×13): 12.5 mg via ORAL
  Filled 2018-08-03 (×14): qty 1

## 2018-08-03 MED ORDER — METOPROLOL TARTRATE 25 MG PO TABS
12.5000 mg | ORAL_TABLET | Freq: Two times a day (BID) | ORAL | Status: DC
  Administered 2018-08-03: 12.5 mg via ORAL
  Filled 2018-08-03: qty 1

## 2018-08-03 MED ORDER — HALOPERIDOL 5 MG PO TABS
10.0000 mg | ORAL_TABLET | Freq: Every day | ORAL | Status: DC
  Administered 2018-08-05 – 2018-08-11 (×6): 10 mg via ORAL
  Filled 2018-08-03 (×8): qty 2

## 2018-08-03 MED ORDER — TRAZODONE HCL 100 MG PO TABS: 100.0000 mg | ORAL_TABLET | Freq: Every evening | ORAL | Status: DC | PRN

## 2018-08-03 MED ORDER — DIVALPROEX SODIUM 500 MG PO DR TAB
500.0000 mg | DELAYED_RELEASE_TABLET | Freq: Two times a day (BID) | ORAL | Status: DC
  Administered 2018-08-04 – 2018-08-12 (×15): 500 mg via ORAL
  Filled 2018-08-03 (×17): qty 1

## 2018-08-03 NOTE — [facility] Group Notes (Signed)
[facility] Group Notes:  (Nursing/MHT/Case Management/Adjunct)  Date:  08/03/2018  Time:  9:55 PM  Type of Therapy:  Group Therapy  Participation Level:  Did Not Attend  Mayra Neer 08/03/2018, 9:55 PM

## 2018-08-03 NOTE — ED Notes (Signed)
SOC with pt att 

## 2018-08-03 NOTE — Progress Notes (Signed)
Clozapine monitoring  ANC 4.2. Lab entered and patient is eligible to receive with every 4 week monitoring per [employer] website.  Fulton ReekMatt Diedra Sinor, PharmD, BCPS  08/03/18 5:33 AM

## 2018-08-03 NOTE — BH Assessment (Signed)
Patient is to be admitted to [facility] by Dr. Viviano Simas.  Attending Physician will be Dr. Jennet Maduro.   Patient has been assigned to room 304, by [facility] Charge Nurse Lillette Boxer   ER staff is aware of the admission:  Misty Stanley, ER Secretary    Dr. Lenard Lance, ER MD   Amy B., Patient's Nurse   Ivin Booty, Patient Access.

## 2018-08-03 NOTE — [facility]HH Suicide Risk Assessment (Signed)
  [facility] Admission Suicide Risk Assessment   Nursing information obtained from:    Demographic factors:    Current Mental Status:    Loss Factors:    Historical Factors:    Risk Reduction Factors:     Total Time spent with patient: 1 hour Principal Problem: Schizoaffective disorder, bipolar type (HCC) Diagnosis:  Principal Problem:   Schizoaffective disorder, bipolar type (HCC)  Subjective Data: psychotic break  Continued Clinical Symptoms:    The "Alcohol Use Disorders Identification Test", Guidelines for Use in Primary Care, Second Edition.  World Science writer [facility]). Score between 0-7:  no or low risk or alcohol related problems. Score between 8-15:  moderate risk of alcohol related problems. Score between 16-19:  high risk of alcohol related problems. Score 20 or above:  warrants further diagnostic evaluation for alcohol dependence and treatment.   CLINICAL FACTORS:   Schizophrenia:   Depressive state Less than 51 years old Paranoid or undifferentiated type   Musculoskeletal: Strength & Muscle Tone: within normal limits Gait & Station: normal Patient leans: N/A  Psychiatric Specialty Exam: Physical Exam  Nursing note and vitals reviewed. Psychiatric: His affect is blunt. His speech is delayed. He is withdrawn and actively hallucinating. Thought content is paranoid and delusional. Cognition and memory are impaired. He expresses impulsivity.    Review of Systems  Unable to perform ROS: Mental status change  All other systems reviewed and are negative.   Blood pressure 127/71, pulse 79, temperature 98.8 F (37.1 C), temperature source Oral, height 6' (1.829 m), SpO2 100 %.Body mass index is 28.48 kg/m.  General Appearance: Casual  Eye Contact:  None  Speech:  Slow  Volume:  Decreased  Mood:  Euthymic  Affect:  Flat  Thought Process:  Irrelevant  Orientation:  Other:  person and place  Thought Content:  Delusions, Hallucinations: Auditory and Paranoid  Ideation  Suicidal Thoughts:  No  Homicidal Thoughts:  No  Memory:  Immediate;   Poor Recent;   Poor Remote;   Poor  Judgement:  Poor  Insight:  Lacking  Psychomotor Activity:  Decreased  Concentration:  Concentration: Poor and Attention Span: Poor  Recall:  Poor  Fund of Knowledge:  Fair  Language:  Poor  Akathisia:  No  Handed:  Right  AIMS (if indicated):     Assets:  Communication Skills Desire for Improvement Housing Physical Health Resilience Social Support  ADL's:  Intact  Cognition:  Impaired,  Mild  Sleep:         COGNITIVE FEATURES THAT CONTRIBUTE TO RISK:  None    SUICIDE RISK:   Minimal: No identifiable suicidal ideation.  Patients presenting with no risk factors but with morbid ruminations; may be classified as minimal risk based on the severity of the depressive symptoms  PLAN OF CARE: hospital admission, medication management, discharge planning.  Mr. Mi is a 23 year old male with a history of treatment resistant schizophrenia admitted for another psychotic break in the context of treatment noncompliance.   #Catatonia -Ativan is available  #Psychosis -restart Clozapine 300 mg nightly -Halsdol 10 mg nightly -Luvox 25 mg nightly for augmentation -Depakote 500 mg BID -Restoril 15 mg nightly  #Tachycardia -Metoprolol 12.5 mg BID  #Metabolic syndrome prevention -Metformin 500 mg BID  #Labs -lipid panel, TSH. A1C -EKG  #Disposition -discharge with the mother -Follow up with [name]  I certify that inpatient services furnished can reasonably be expected to improve the patient's condition.   Kristine Linea, MD 08/03/2018, 1:00 PM

## 2018-08-03 NOTE — ED Notes (Signed)
Report Dr Rexene Edison, SOC, finished att, awaiting call in

## 2018-08-03 NOTE — H&P (Signed)
Psychiatric Admission Assessment Adult  Patient Identification: [name] [name] MRN:  [id number] Date of Evaluation:  08/03/2018 Chief Complaint:  schizophrenia Principal Diagnosis: Schizoaffective disorder, bipolar type (HCC) Diagnosis:  Principal Problem:   Schizoaffective disorder, bipolar type (HCC)  History of Present Illness:   Identifying data. [name] [name] is a 23 year old male with a history of treatment resistant schizophrenia.  Chief complaint. "I don't know."  History of present illness. Information was obtained from the patient and the chart. The [facility] the urging of his mother. Reportedly, he has been refusing medications, got into an argument with his mother and run away from home. The patient himself provides little information. He is responding to internal stimuli, with psychomotor retardation and poor eye contact. On direct questioning, he denies any symptoms of depression, anxiety or psychosis. He is not suicidal or homicidal. He reports good compliance with treatment. He does not know who his psychiatrist is.  When I returned to his room, the patient is standing still, face turned away from me, only nods. Does not feel like talking now.  Past psychiatric history. Several psychiatric admissions since the age of 23. Tried on multiple medications. Responds to Clozapine but compliance has been a problem. In the fall, he [facility].  Family psychiatric history. None.  Social history. He lives with his mother and brother, [place].  Total Time spent with patient: 1 hour  Is the patient at risk to self? Yes.    Has the patient been a risk to self in the past 6 months? Yes.    Has the patient been a risk to self within the distant past? Yes.    Is the patient a risk to others? No.  Has the patient been a risk to others in the past 6 months? No.  Has the patient  been a risk to others within the distant past? No.   Prior Inpatient Therapy:   Prior Outpatient Therapy:    Alcohol Screening: Patient refused Alcohol Screening Tool: Yes 1. How often do you have a drink containing alcohol?: Never 2. How many drinks containing alcohol do you have on a typical day when you are drinking?: 1 or 2 3. How often do you have six or more drinks on one occasion?: Never AUDIT-C Score: 0 Intervention/Follow-up: Patient Refused Substance Abuse History in the last 12 months:  Yes.   Consequences of Substance Abuse: Negative Previous Psychotropic Medications: Yes  Psychological Evaluations: No  Past Medical History:  Past Medical History:  Diagnosis Date  . Asthma   . Depression   . Psychosis [facility](HCC)     Past Surgical History:  Procedure Laterality Date  . BACK SURGERY     Family History: History reviewed. No pertinent family history.   Tobacco Screening: Have you used any form of tobacco in the last 30 days? (Cigarettes, Smokeless Tobacco, Cigars, and/or Pipes): No Social History:  Social History   Substance and Sexual Activity  Alcohol Use No     Social History   Substance and Sexual Activity  Drug Use Yes  . Types: Marijuana   Allergies:  No Known Allergies Lab Results:  Results for orders placed or performed during the hospital encounter of 08/02/18 (from the past 48 hour(s))  Comprehensive metabolic panel     Status: Abnormal   Collection Time: 08/02/18  8:25 PM  Result Value Ref Range   Sodium 139  135 - 145 mmol/L   Potassium 3.6 3.5 - 5.1 mmol/L   Chloride 105 98 - 111 mmol/L   CO2 22 22 - 32 mmol/L   Glucose, Bld 81 70 - 99 mg/dL   BUN 15 6 - 20 mg/dL   Creatinine, Ser 1.61 0.61 - 1.24 mg/dL   Calcium 9.7 8.9 - 09.6 mg/dL   Total Protein 8.3 (H) 6.5 - 8.1 g/dL   Albumin 5.1 (H) 3.5 - 5.0 g/dL   AST 28 15 - 41 U/L   ALT 42 0 - 44 U/L   Alkaline Phosphatase 99 38 - 126 U/L   Total Bilirubin 2.6 (H) 0.3 - 1.2 mg/dL   GFR calc non  Af Amer >60 >60 mL/min   GFR calc Af Amer >60 >60 mL/min   Anion gap 12 5 - 15    Comment: Performed at [facility], [address] [place], [place] [address]  Ethanol     Status: None   Collection Time: 08/02/18  8:25 PM  Result Value Ref Range   Alcohol, Ethyl (B) <10 <10 mg/dL    Comment: (NOTE) Lowest detectable limit for serum alcohol is 10 mg/dL. For medical purposes only. Performed at [facility], [address] [place], [place] [address]   Salicylate level     Status: None   Collection Time: 08/02/18  8:25 PM  Result Value Ref Range   Salicylate Lvl <7.0 2.8 - 30.0 mg/dL    Comment: Performed at [facility], [address] [place], [place] [address]  Acetaminophen level     Status: Abnormal   Collection Time: 08/02/18  8:25 PM  Result Value Ref Range   Acetaminophen (Tylenol), Serum <10 (L) 10 - 30 ug/mL    Comment: (NOTE) Therapeutic concentrations vary significantly. A range of 10-30 ug/mL  may be an effective concentration for many patients. However, some  are best treated at concentrations outside of this range. Acetaminophen concentrations >150 ug/mL at 4 hours after ingestion  and >50 ug/mL at 12 hours after ingestion are often associated with  toxic reactions. Performed at [facility], [address] [place], [place] [address]   cbc     Status: Abnormal   Collection Time: 08/02/18  8:25 PM  Result Value Ref Range   WBC 6.4 4.0 - 10.5 K/uL   RBC 5.62 4.22 - 5.81 MIL/uL   Hemoglobin 15.8 13.0 - 17.0 g/dL   HCT 30.8 65.7 - 84.6 %   MCV 79.7 (L) 80.0 - 100.0 fL   MCH 28.1 26.0 - 34.0 pg   MCHC 35.3 30.0 - 36.0 g/dL   RDW 96.2 95.2 - 84.1 %   Platelets 310 150 - 400 K/uL   nRBC 0.0 0.0 - 0.2 %    Comment: Performed at [facility], [address] [place], [place] [address]  Differential     Status: None   Collection Time: 08/02/18  8:25 PM  Result Value Ref Range   Neutrophils Relative % 64 %    Neutro Abs 4.2 1.7 - 7.7 K/uL   Lymphocytes Relative 26 %   Lymphs Abs 1.7 0.7 - 4.0 K/uL   Monocytes Relative 8 %   Monocytes Absolute 0.5 0.1 - 1.0 K/uL   Eosinophils Relative 2 %   Eosinophils Absolute 0.2 0.0 - 0.5 K/uL   Basophils Relative 0 %   Basophils Absolute 0.0 0.0 - 0.1 K/uL    Comment: Performed at [facility], [address] [place], [place] [address]    Blood Alcohol level:  Lab Results  Component Value Date   [facility] <10 08/02/2018   ETH <10 05/19/2018    Metabolic Disorder Labs:  Lab Results  Component Value Date   HGBA1C 4.9 03/08/2018   MPG 93.93 03/08/2018   MPG 85.32 12/30/2017   No results found for: PROLACTIN Lab Results  Component Value Date   CHOL 238 (H) 03/08/2018   TRIG 69 03/08/2018   HDL 56 03/08/2018   CHOLHDL 4.3 03/08/2018   VLDL 14 03/08/2018   LDLCALC 168 (H) 03/08/2018   LDLCALC 97 12/30/2017    Current Medications: Current Facility-Administered Medications  Medication Dose Route Frequency Provider Last Rate Last Dose  . acetaminophen (TYLENOL) tablet 650 mg  650 mg Oral Q6H PRN Mariel Craft, MD      . alum & mag hydroxide-simeth (MAALOX/MYLANTA) 200-200-20 MG/5ML suspension 30 mL  30 mL Oral Q4H PRN Mariel Craft, MD      . cloZAPine (CLOZARIL) tablet 300 mg  300 mg Oral QHS Mariel Craft, MD      . diphenhydrAMINE (BENADRYL) capsule 50 mg  50 mg Oral QHS PRN Mariel Craft, MD      . divalproex (DEPAKOTE) DR tablet 500 mg  500 mg Oral Q12H Mariel Craft, MD      . fluvoxaMINE (LUVOX) tablet 25 mg  25 mg Oral QHS Mariel Craft, MD      . haloperidol (HALDOL) tablet 10 mg  10 mg Oral QHS Mariel Craft, MD      . LORazepam (ATIVAN) tablet 1 mg  1 mg Oral Q4H PRN Mariel Craft, MD      . magnesium hydroxide (MILK OF MAGNESIA) suspension 30 mL  30 mL Oral Daily PRN Mariel Craft, MD      . Melene Muller ON 08/04/2018] metFORMIN (GLUCOPHAGE) tablet 500 mg  500 mg Oral Q breakfast Mariel Craft, MD      .  metoprolol tartrate (LOPRESSOR) tablet 12.5 mg  12.5 mg Oral BID Mariel Craft, MD      . traZODone (DESYREL) tablet 100 mg  100 mg Oral QHS PRN Mariel Craft, MD       PTA Medications: Medications Prior to Admission  Medication Sig Dispense Refill Last Dose  . cloZAPine (CLOZARIL) 100 MG tablet Take 3 tablets (300 mg total) by mouth at bedtime. (Patient not taking: Reported on 08/03/2018) 90 tablet 1` Not Taking at Unknown time  . divalproex (DEPAKOTE) 500 MG DR tablet Take 1 tablet (500 mg total) by mouth every 12 (twelve) hours. (Patient not taking: Reported on 08/03/2018) 60 tablet 1 Not Taking at Unknown time  . fluvoxaMINE (LUVOX) 25 MG tablet Take 1 tablet (25 mg total) by mouth at bedtime. (Patient not taking: Reported on 08/03/2018) 30 tablet 1 Not Taking at Unknown time  . haloperidol (HALDOL) 10 MG tablet Take 1 tablet (10 mg total) by mouth at bedtime. (Patient not taking: Reported on 08/03/2018) 30 tablet 1 Not Taking at Unknown time  . metFORMIN (GLUCOPHAGE) 500 MG tablet Take 1 tablet (500 mg total) by mouth daily with breakfast. (Patient not taking: Reported on 08/03/2018) 30 tablet 1 Not Taking at Unknown time  . metoprolol tartrate (LOPRESSOR) 25 MG tablet Take 0.5 tablets (12.5 mg total) by mouth 2 (two) times daily. (Patient not taking: Reported on 08/03/2018) 60 tablet 1 Not Taking at Unknown time  . traZODone (DESYREL) 100 MG tablet  Take 1 tablet (100 mg total) by mouth at bedtime as needed for sleep. (Patient not taking: Reported on 08/03/2018) 30 tablet 1 Not Taking at Unknown time    Musculoskeletal: Strength & Muscle Tone: within normal limits Gait & Station: normal Patient leans: N/A  Psychiatric Specialty Exam: Physical Exam  Nursing note and vitals reviewed. Constitutional: He appears well-developed and well-nourished.  HENT:  Head: Normocephalic and atraumatic.  Eyes: Pupils are equal, round, and reactive to light. Conjunctivae and EOM are normal.  Neck: Normal  range of motion. Neck supple.  Cardiovascular: Normal rate and regular rhythm.  Respiratory: Effort normal and breath sounds normal.  GI: Soft.  Musculoskeletal: Normal range of motion.  Neurological: He is alert.  Skin: Skin is warm and dry.    Review of Systems  Unable to perform ROS: Mental status change  All other systems reviewed and are negative.   Blood pressure 127/71, pulse 79, temperature 98.8 F (37.1 C), temperature source Oral, resp. rate 18, height 6' (1.829 m), weight 94.3 kg, SpO2 100 %.Body mass index is 28.21 kg/m.  See SRA                                                  Sleep:       Treatment Plan Summary: Daily contact with patient to assess and evaluate symptoms and progress in treatment and Medication management   [name] [name] is a 23 year old male with a history of treatment resistant schizophrenia admitted for another psychotic break in the context of treatment noncompliance.   #Catatonia -Ativan is available  #Psychosis -restart Clozapine 300 mg nightly -Halsdol 10 mg nightly -Luvox 25 mg nightly for augmentation -Depakote 500 mg BID -Restoril 15 mg nightly  #Tachycardia -Metoprolol 12.5 mg BID  #Metabolic syndrome prevention -Metformin 500 mg BID  #Labs -lipid panel, TSH. A1C -EKG  #Disposition -discharge with the mother -[facility]   Observation Level/Precautions:  15 minute checks  Laboratory:  CBC Chemistry Profile UDS UA  Psychotherapy:    Medications:    Consultations:    Discharge Concerns:    Estimated LOS:  Other:     Physician Treatment Plan for Primary Diagnosis: Schizoaffective disorder, bipolar type (HCC) Long Term Goal(s): Improvement in symptoms so as ready for discharge  Short Term Goals: Ability to identify changes in lifestyle to reduce recurrence of condition will improve, Ability to verbalize feelings will improve, Ability to disclose and discuss suicidal ideas,  Ability to demonstrate self-control will improve, Ability to identify and develop effective coping behaviors will improve, Ability to maintain clinical measurements within normal limits will improve, Compliance with prescribed medications will improve and Ability to identify triggers associated with substance abuse/mental health issues will improve  Physician Treatment Plan for Secondary Diagnosis: Principal Problem:   Schizoaffective disorder, bipolar type (HCC)  Long Term Goal(s): Improvement in symptoms so as ready for discharge  Short Term Goals: NA  I certify that inpatient services furnished can reasonably be expected to improve the patient's condition.    Kristine LineaJolanta Kamorie Aldous, MD 1/13/20201:26 PM

## 2018-08-03 NOTE — Tx Team (Signed)
Initial Treatment Plan 08/03/2018 6:22 PM [name] [name] [id number]    PATIENT STRESSORS: Medication change or noncompliance   PATIENT STRENGTHS: Ability for insight   PATIENT IDENTIFIED PROBLEMS: Depression  Psychosis  Asthma                 DISCHARGE CRITERIA:  Ability to meet basic life and health needs Adequate post-discharge living arrangements  PRELIMINARY DISCHARGE PLAN: Return to previous living arrangement  PATIENT/FAMILY INVOLVEMENT: This treatment plan has been presented to and reviewed with the patient, [name] [name], and/or f and family have been given the opportunity to ask questions and make suggestions.  Sharia Reeve, RN 08/03/2018, 6:22 PM

## 2018-08-03 NOTE — ED Provider Notes (Signed)
-----------------------------------------   4:12 AM on 08/03/2018 -----------------------------------------   Blood pressure (!) 145/93, pulse (!) 107, temperature 98.4 F (36.9 C), temperature source Oral, resp. rate 20, height 1.829 m (6'), weight 95.3 kg, SpO2 98 %.  The patient is currently asleep.  Telepsych evaluation recommends inpatient treatment.  Added medication recommendations to the orders.   Loleta Rose, MD 08/03/18 [phone]

## 2018-08-03 NOTE — ED Notes (Signed)
Pt discharged under IVC to BMU.  VS stable. Belongings will be sent with patient. Pt accepting of disposition.

## 2018-08-03 NOTE — Progress Notes (Signed)
  Admission Note    Patient is 24 years old arrived at the unit around 1300.  Patient was IVC by his mother for treatment non-compliance. Vital signs stable. Upon patient assessment patient noted to   be responding to internal stimuli, shaking very bad and unable to obtain accurate vital sign.   Patient was assured that the environment is safe and staff are here to help.  Vital signs were normal after patient relaxed.  Patient answer to very question NO.  Pt is noted to be just standing in is room refusing to follow direction to sit on the bed. Staff will help encourage patient to comply with his treatment while here @ [facility].  Will continue to monitor with Q15 minute safety check.

## 2018-08-03 NOTE — ED Provider Notes (Signed)
-----------------------------------------   10:18 AM on 08/03/2018 -----------------------------------------  Patient has been seen by psychiatry.  They will be admitting to their service once a bed becomes available.   Minna Antis, MD 08/03/18 1018

## 2018-08-03 NOTE — [facility] Group Notes (Signed)
LCSW Group Therapy Note   08/03/2018 1:00PM  Type of Therapy and Topic:  Group Therapy:  Overcoming Obstacles   Participation Level:  Did Not Attend   Description of Group:    In this group patients will be encouraged to explore what they see as obstacles to their own wellness and recovery. They will be guided to discuss their thoughts, feelings, and behaviors related to these obstacles. The group will process together ways to cope with barriers, with attention given to specific choices patients can make. Each patient will be challenged to identify changes they are motivated to make in order to overcome their obstacles. This group will be process-oriented, with patients participating in exploration of their own experiences as well as giving and receiving support and challenge from other group members.   Therapeutic Goals: 1. Patient will identify personal and current obstacles as they relate to admission. 2. Patient will identify barriers that currently interfere with their wellness or overcoming obstacles.  3. Patient will identify feelings, thought process and behaviors related to these barriers. 4. Patient will identify two changes they are willing to make to overcome these obstacles:      Summary of Patient Progress  Pt did not attend.     Therapeutic Modalities:   Cognitive Behavioral Therapy Solution Focused Therapy Motivational Interviewing Relapse Prevention Therapy  Penni Homans, MSW, LCSW 08/03/2018 2:19 PM

## 2018-08-03 NOTE — ED Notes (Signed)
Pt taking a shower. No behavioral issues.  Maintained on 15 minute checks and observation by security for safety.

## 2018-08-03 NOTE — BH Assessment (Addendum)
Assessment Note  [name] [name] is an 23 y.o. male. Patient presents as extremely guarded and responses are minimal.Pt unable to provide clear history. Suspect significant thought blocking. Pt. denies the presence of any auditory or visual hallucinations at this time. Patient denies any other medical complaints. While pt denied HVA pt appeared extremely distracted and responses were notably slow to questions that the pt did answer. When asked what prompted its presentation to the emergency department he States" I don't know." He admits to medication compliance although he share that he is unaware as to who prescribes medication. Pt. denies any suicidal ideation, plan or intent. Per chart review the patient has a history of schizophrenia. IVC states that'He is refusing to take his meds and has run away from his residence.  He is acting paranoid by history." He reports that he currently resides with his mother. Patient later shared that he was in an argument with his mother on today and she decided to call the police department on him. Engage the majority of the interaction with Clinical research associatewriter with his eyes closed. He denies any drug or alcohol use it this time. He reports experiencing previous inpatient admissions but has no current outpatient provider.    Diagnosis: Schizoaffective Disorder   Past Medical History:  Past Medical History:  Diagnosis Date  . Asthma   . Depression   . Psychosis [facility](HCC)     Past Surgical History:  Procedure Laterality Date  . BACK SURGERY      Family History: No family history on file.  Social History:  reports that he has never smoked. He has never used smokeless tobacco. He reports current drug use. Drug: Marijuana. He reports that he does not drink alcohol.  Additional Social History:  Alcohol / Drug Use Pain Medications: See PTA Prescriptions: See PTA Over the Counter: See PTA History of alcohol / drug use?: No history of alcohol / drug abuse Longest period of  sobriety (when/how long): None reported  CIWA: CIWA-Ar BP: (!) 145/93 Pulse Rate: (!) 107 COWS:    Allergies: No Known Allergies  Home Medications: (Not in a hospital admission)   OB/GYN Status:  No LMP for male patient.  General Assessment Data [facility] Assessment: In system Is this a Tele or Face-to-Face Assessment?: Face-to-Face Is this an Initial Assessment or a Re-assessment for this encounter?: Initial Assessment Patient Accompanied by:: N/A Language Other than English: No Living Arrangements: Other (Comment) What gender do you identify as?: Male Marital status: Single Living Arrangements: Parent, Other relatives Can pt return to current living arrangement?: Yes Admission Status: Involuntary Petitioner: Family member Is patient capable of signing voluntary admission?: No Referral Source: Self/Family/Friend  Medical Screening Exam [facility] Walk-in ONLY) Medical Exam completed: Yes  Crisis Care Plan Living Arrangements: Parent, Other relatives Legal Guardian: Other: Name of Psychiatrist: unknown (Pt reports none ) Name of Therapist: none   Education Status Is patient currently in school?: No Is the patient employed, unemployed or receiving disability?: Unemployed  Risk to self with the past 6 months Suicidal Ideation: No Has patient been a risk to self within the past 6 months prior to admission? : No Suicidal Intent: No Has patient had any suicidal intent within the past 6 months prior to admission? : No Is patient at risk for suicide?: No, but patient needs Medical Clearance Suicidal Plan?: No Has patient had any suicidal plan within the past 6 months prior to admission? : No Access to Means: No What  has been your use of drugs/alcohol within the last 12 months?: n Previous Attempts/Gestures: No How many times?: 0 Intentional Self Injurious Behavior: None Family Suicide History: No Recent stressful life event(s): Other  (Comment)(UTA) Persecutory voices/beliefs?: No Depression: No Depression Symptoms: Feeling angry/irritable(pt denied) Substance abuse history and/or treatment for substance abuse?: No Suicide prevention information given to non-admitted patients: Not applicable  Risk to Others within the past 6 months Homicidal Ideation: No Does patient have any lifetime risk of violence toward others beyond the six months prior to admission? : No Thoughts of Harm to Others: No Current Homicidal Intent: No Current Homicidal Plan: No Access to Homicidal Means: No Identified Victim: no History of harm to others?: No Assessment of Violence: None Noted Violent Behavior Description: none  Does patient have access to weapons?: No Criminal Charges Pending?: No Does patient have a court date: No Is patient on probation?: No  Psychosis Hallucinations: None noted Delusions: None noted  Mental Status Report Eye Contact: Poor Motor Activity: Freedom of movement Speech: Pressured Level of Consciousness: Restless Mood: Depressed, Suspicious Affect: Constricted Anxiety Level: None Thought Processes: Coherent, Relevant Judgement: Partial Orientation: Time, Place, Person, Situation Obsessive Compulsive Thoughts/Behaviors: None  Cognitive Functioning Concentration: Poor Memory: Remote Intact, Recent Intact Is patient IDD: No Insight: Poor Impulse Control: Poor Appetite: Fair Have you had any weight changes? : No Change Sleep: No Change Total Hours of Sleep: 8 Vegetative Symptoms: Unable to Assess  ADLScreening [facility] Assessment Services) Patient's cognitive ability adequate to safely complete daily activities?: Yes Patient able to express need for assistance with ADLs?: Yes Independently performs ADLs?: Yes (appropriate for developmental age)  Prior Inpatient Therapy Prior Inpatient Therapy: Yes Prior Therapy Dates: 04/2018 Prior Therapy Facilty/Provider(s): [facility] Reason for Treatment:  Schizoaffective Disorder  Prior Outpatient Therapy Prior Outpatient Therapy: No Does patient have an ACCT team?: No Does patient have Intensive In-House Services?  : No Does patient have [facility] services? : No Does patient have P4CC services?: No  ADL Screening (condition at time of admission) Patient's cognitive ability adequate to safely complete daily activities?: Yes Patient able to express need for assistance with ADLs?: Yes Independently performs ADLs?: Yes (appropriate for developmental age)       Abuse/Neglect Assessment (Assessment to be complete while patient is alone) Abuse/Neglect Assessment Can Be Completed: Yes Physical Abuse: Denies Verbal Abuse: Denies Sexual Abuse: Denies Exploitation of patient/patient's resources: Denies Self-Neglect: Denies Values / Beliefs Cultural Requests During Hospitalization: None Spiritual Requests During Hospitalization: None Consults Spiritual Care Consult Needed: No Social Work Consult Needed: No Merchant navy officer (For Healthcare) Does Patient Have a Medical Advance Directive?: No          Disposition:  Disposition Initial Assessment Completed for this Encounter: Yes Patient referred to: Other (Comment)(Consult with Psych MD)  On Site Evaluation by:   Reviewed with Physician:    Asa Saunas 08/03/2018 2:02 AM

## 2018-08-03 NOTE — ED Notes (Signed)
Pt given breakfast.

## 2018-08-03 NOTE — ED Notes (Signed)
Pt to ED with LE under IVC by mother, reports pt ran away from home and not taking meds, pt reports "going out for a walk in the park", pt appears nervous and restrict, fearful of this staff and pressured speech,  Hx of schizoaffective disorder  Poor historian

## 2018-08-03 NOTE — ED Notes (Addendum)
Pt in toilet for approx 25 min, when door opened to check on pt, pt appeared to be stooling

## 2018-08-03 NOTE — BH Assessment (Signed)
Patient is to be admitted to [facility] by Dr. Viviano Simas.  Attending Physician will be Dr. Jennet Maduro.   Patient has been assigned to room 304, by [facility] Charge Nurse Lillette Boxer   ER staff is aware of the admission:  Misty Stanley, ER Secretary    Dr. Lenard Lance, ER MD   Amy T., Patient's Nurse   Ivin Booty, Patient Access.

## 2018-08-03 NOTE — ED Notes (Signed)
Pt. showering

## 2018-08-04 LAB — CLOZAPINE (CLOZARIL)
Clozapine Lvl: NOT DETECTED ng/mL (ref 350–650)
NORCLOZAPINE: NOT DETECTED ng/mL
TOTAL(CLOZ+ NORCLOZ): UNDETERMINED ng/mL

## 2018-08-04 NOTE — Plan of Care (Signed)
D- Patient alert and oriented. Patient presents in a pleasant mood on assessment stating that he slept good last night, but started the day out refusing vitals as well as medications. Patient was encouraged by staff to let us obtain his vitals and administer his medication. Patient has been isolative to this room majority of the day and has not eaten any of his meals. Patient denies any signs/symptoms of depression and anxiety to this writer stating "I'm fine". Patient also denies SI, HI, AVH, and pain at this time. Patient had no stated goals for today.  A- Scheduled medications administered to patient, per MD orders. Support and encouragement provided.  Routine safety checks conducted every 15 minutes.  Patient informed to notify staff with problems or concerns.  R- No adverse drug reactions noted. Patient contracts for safety at this time. Patient compliant with medications and treatment plan. Patient receptive, calm, and cooperative. Patient interacts well with others on the unit.  Patient remains safe at this time.    Problem: Activity: Goal: Will identify at least one activity in which they can participate 08/04/2018 1235 by Doyce Para, RN Outcome: Not Progressing 08/04/2018 1234 by Doyce Para, RN Outcome: Progressing   Problem: Coping: Goal: Ability to identify and develop effective coping behavior will improve 08/04/2018 1235 by Doyce Para, RN Outcome: Not Progressing 08/04/2018 1234 by Doyce Para, RN Outcome: Progressing Goal: Ability to interact with others will improve 08/04/2018 1235 by Doyce Para, RN Outcome: Not Progressing 08/04/2018 1234 by Doyce Para, RN Outcome: Progressing Goal: Demonstration of participation in decision-making regarding own care will improve 08/04/2018 1235 by Doyce Para, RN Outcome: Not Progressing 08/04/2018 1234 by Doyce Para, RN Outcome: Progressing Goal: Ability to use eye contact when  communicating with others will improve 08/04/2018 1235 by Doyce Para, RN Outcome: Not Progressing 08/04/2018 1234 by Doyce Para, RN Outcome: Progressing   Problem: Self-Concept: Goal: Will verbalize positive feelings about self 08/04/2018 1235 by Doyce Para, RN Outcome: Not Progressing 08/04/2018 1234 by Doyce Para, RN Outcome: Progressing   Problem: Education: Goal: Knowledge of [place] [facility] Education information/materials will improve 08/04/2018 1235 by Doyce Para, RN Outcome: Not Progressing 08/04/2018 1234 by Doyce Para, RN Outcome: Progressing Goal: Emotional status will improve 08/04/2018 1235 by Doyce Para, RN Outcome: Not Progressing 08/04/2018 1234 by Doyce Para, RN Outcome: Progressing Goal: Mental status will improve 08/04/2018 1235 by Doyce Para, RN Outcome: Not Progressing 08/04/2018 1234 by Doyce Para, RN Outcome: Progressing   Problem: Health Behavior/Discharge Planning: Goal: Identification of resources available to assist in meeting health care needs will improve 08/04/2018 1235 by Doyce Para, RN Outcome: Not Progressing 08/04/2018 1234 by Doyce Para, RN Outcome: Progressing Goal: Compliance with treatment plan for underlying cause of condition will improve 08/04/2018 1235 by Doyce Para, RN Outcome: Not Progressing 08/04/2018 1234 by Doyce Para, RN Outcome: Progressing

## 2018-08-04 NOTE — Progress Notes (Signed)
[facility] MD Progress Note  08/04/2018 1:16 PM [name] [name]  MRN:  [id number]  Subjective:   [name] [name] was hiding in the bathroom all morning long. Ate luch facing the wall then barricaded himself in ghis room. Refused all medications "because I can refuse". Selectively mute. Posturing at times. Will required forced medications.  Principal Problem: Schizoaffective disorder, bipolar type (HCC) Diagnosis: Principal Problem:   Schizoaffective disorder, bipolar type (HCC)  Total Time spent with patient: 20 minutes  Past Psychiatric History: schizophrenia  Past Medical History:  Past Medical History:  Diagnosis Date  . Asthma   . Depression   . Psychosis [facility])     Past Surgical History:  Procedure Laterality Date  . BACK SURGERY     Family History: History reviewed. No pertinent family history. Family Psychiatric  History: none Social History:  Social History   Substance and Sexual Activity  Alcohol Use No     Social History   Substance and Sexual Activity  Drug Use Yes  . Types: Marijuana    Social History   Socioeconomic History  . Marital status: Single    Spouse name: Not on file  . Number of children: Not on file  . Years of education: Not on file  . Highest education level: Not on file  Occupational History  . Not on file  Social Needs  . Financial resource strain: Not on file  . Food insecurity:    Worry: Not on file    Inability: Not on file  . Transportation needs:    Medical: Not on file    Non-medical: Not on file  Tobacco Use  . Smoking status: Never Smoker  . Smokeless tobacco: Never Used  Substance and Sexual Activity  . Alcohol use: No  . Drug use: Yes    Types: Marijuana  . Sexual activity: Not Currently  Lifestyle  . Physical activity:    Days per week: Not on file    Minutes per session: Not on file  . Stress: Not on file  Relationships  . Social connections:    Talks on phone: Not on file    Gets together: Not on file    Attends  religious service: Not on file    Active member of club or organization: Not on file    Attends meetings of clubs or organizations: Not on file    Relationship status: Not on file  Other Topics Concern  . Not on file  Social History Narrative  . Not on file   Additional Social History:                         Sleep: Poor  Appetite:  Fair  Current Medications: Current Facility-Administered Medications  Medication Dose Route Frequency Provider Last Rate Last Dose  . acetaminophen (TYLENOL) tablet 650 mg  650 mg Oral Q6H PRN Mariel Craft, MD      . alum & mag hydroxide-simeth (MAALOX/MYLANTA) 200-200-20 MG/5ML suspension 30 mL  30 mL Oral Q4H PRN Mariel Craft, MD      . cloZAPine (CLOZARIL) tablet 300 mg  300 mg Oral QHS Mariel Craft, MD      . diphenhydrAMINE (BENADRYL) capsule 50 mg  50 mg Oral QHS PRN Mariel Craft, MD      . divalproex (DEPAKOTE) DR tablet 500 mg  500 mg Oral Q12H Mariel Craft, MD   500 mg at 08/04/18 1018  . fluvoxaMINE (LUVOX) tablet 25  mg  25 mg Oral QHS Mariel CraftMaurer, Sheila M, MD      . haloperidol (HALDOL) tablet 10 mg  10 mg Oral QHS Mariel CraftMaurer, Sheila M, MD      . LORazepam (ATIVAN) tablet 1 mg  1 mg Oral Q4H PRN Mariel CraftMaurer, Sheila M, MD      . magnesium hydroxide (MILK OF MAGNESIA) suspension 30 mL  30 mL Oral Daily PRN Mariel CraftMaurer, Sheila M, MD      . metFORMIN (GLUCOPHAGE) tablet 500 mg  500 mg Oral Q breakfast Mariel CraftMaurer, Sheila M, MD   500 mg at 08/04/18 1018  . metoprolol tartrate (LOPRESSOR) tablet 12.5 mg  12.5 mg Oral BID Mariel CraftMaurer, Sheila M, MD   12.5 mg at 08/04/18 1018  . traZODone (DESYREL) tablet 100 mg  100 mg Oral QHS PRN Mariel CraftMaurer, Sheila M, MD        Lab Results:  Results for orders placed or performed during the hospital encounter of 08/02/18 (from the past 48 hour(s))  Comprehensive metabolic panel     Status: Abnormal   Collection Time: 08/02/18  8:25 PM  Result Value Ref Range   Sodium 139 135 - 145 mmol/L   Potassium 3.6 3.5 - 5.1  mmol/L   Chloride 105 98 - 111 mmol/L   CO2 22 22 - 32 mmol/L   Glucose, Bld 81 70 - 99 mg/dL   BUN 15 6 - 20 mg/dL   Creatinine, Ser 1.611.03 0.61 - 1.24 mg/dL   Calcium 9.7 8.9 - 09.610.3 mg/dL   Total Protein 8.3 (H) 6.5 - 8.1 g/dL   Albumin 5.1 (H) 3.5 - 5.0 g/dL   AST 28 15 - 41 U/L   ALT 42 0 - 44 U/L   Alkaline Phosphatase 99 38 - 126 U/L   Total Bilirubin 2.6 (H) 0.3 - 1.2 mg/dL   GFR calc non Af Amer >60 >60 mL/min   GFR calc Af Amer >60 >60 mL/min   Anion gap 12 5 - 15    Comment: Performed at [facility], [address] [place], [place] [address]  Ethanol     Status: None   Collection Time: 08/02/18  8:25 PM  Result Value Ref Range   Alcohol, Ethyl (B) <10 <10 mg/dL    Comment: (NOTE) Lowest detectable limit for serum alcohol is 10 mg/dL. For medical purposes only. Performed at [facility], [address] [place], [place] [address]   Salicylate level     Status: None   Collection Time: 08/02/18  8:25 PM  Result Value Ref Range   Salicylate Lvl <7.0 2.8 - 30.0 mg/dL    Comment: Performed at [facility], [address] [place], [place] [address]  Acetaminophen level     Status: Abnormal   Collection Time: 08/02/18  8:25 PM  Result Value Ref Range   Acetaminophen (Tylenol), Serum <10 (L) 10 - 30 ug/mL    Comment: (NOTE) Therapeutic concentrations vary significantly. A range of 10-30 ug/mL  may be an effective concentration for many patients. However, some  are best treated at concentrations outside of this range. Acetaminophen concentrations >150 ug/mL at 4 hours after ingestion  and >50 ug/mL at 12 hours after ingestion are often associated with  toxic reactions. Performed at [facility], [address] [place], [place] [address]   cbc     Status: Abnormal   Collection Time: 08/02/18  8:25 PM  Result Value Ref Range   WBC 6.4 4.0 - 10.5 K/uL  RBC 5.62 4.22 - 5.81 MIL/uL   Hemoglobin 15.8 13.0 - 17.0 g/dL   HCT 16.1  09.6 - 04.5 %   MCV 79.7 (L) 80.0 - 100.0 fL   MCH 28.1 26.0 - 34.0 pg   MCHC 35.3 30.0 - 36.0 g/dL   RDW 40.9 81.1 - 91.4 %   Platelets 310 150 - 400 K/uL   nRBC 0.0 0.0 - 0.2 %    Comment: Performed at [facility], [address] [place], [place] [address]  Clozapine (clozaril)     Status: None   Collection Time: 08/02/18  8:25 PM  Result Value Ref Range   Clozapine Lvl None Detected 350 - 650 ng/mL    Comment: (NOTE) This test was developed and its performance characteristics determined by [facility]. It has not been cleared or approved by the Food and Drug Administration.    NorClozapine None Detected Not Estab. ng/mL    Comment: (NOTE) This test was developed and its performance characteristics determined by [facility]. It has not been cleared or approved by the Food and Drug Administration. Performed At: [facility] [address] [place], [place] [address] Jolene Schimke MD QI:[id number]    Total(Cloz+Norcloz) UNABLE TO CALCULATE ng/mL    Comment: (NOTE) Unable to calculate result since non-numeric result obtained for component test. Patients dosed with 400 mg clozapine daily for 4 weeks were most likely to exhibit a therapeutic effect when the sum of clozapine and norclozapine concentrations were at least 450 ng/mL. Charlott Rakes, et al. Juel Burrow Consensus Guidelines for Therapeutic Drug Monitoring in Psychiatry: Update 2011, Pharmacopsychiatry Sep 2011; 44(6):195-235.                                Detection Limit = 20   Differential     Status: None   Collection Time: 08/02/18  8:25 PM  Result Value Ref Range   Neutrophils Relative % 64 %   Neutro Abs 4.2 1.7 - 7.7 K/uL   Lymphocytes Relative 26 %   Lymphs Abs 1.7 0.7 - 4.0 K/uL   Monocytes Relative 8 %   Monocytes Absolute 0.5 0.1 - 1.0 K/uL   Eosinophils Relative 2 %   Eosinophils Absolute 0.2 0.0 - 0.5 K/uL   Basophils Relative 0 %   Basophils Absolute 0.0 0.0 - 0.1 K/uL     Comment: Performed at [facility], [address] [place], [place] [address]    Blood Alcohol level:  Lab Results  Component Value Date   [facility] <10 08/02/2018   ETH <10 05/19/2018    Metabolic Disorder Labs: Lab Results  Component Value Date   HGBA1C 4.9 03/08/2018   MPG 93.93 03/08/2018   MPG 85.32 12/30/2017   No results found for: PROLACTIN Lab Results  Component Value Date   CHOL 238 (H) 03/08/2018   TRIG 69 03/08/2018   HDL 56 03/08/2018   CHOLHDL 4.3 03/08/2018   VLDL 14 03/08/2018   LDLCALC 168 (H) 03/08/2018   LDLCALC 97 12/30/2017    Physical Findings: AIMS:  , ,  ,  ,    CIWA:    COWS:     Musculoskeletal: Strength & Muscle Tone: within normal limits Gait & Station: normal Patient leans: N/A  Psychiatric Specialty Exam: Physical Exam  Nursing note and vitals reviewed. Psychiatric: His affect is blunt and inappropriate. He is slowed, withdrawn and actively hallucinating. Thought content is paranoid  and delusional. Cognition and memory are impaired. He expresses impulsivity. He is noncommunicative.    Review of Systems  Neurological: Negative.   Psychiatric/Behavioral: Positive for hallucinations. The patient has insomnia.   All other systems reviewed and are negative.   Blood pressure 123/73, pulse 77, temperature 98.8 F (37.1 C), temperature source Oral, resp. rate 18, height 6' (1.829 m), weight 94.3 kg, SpO2 100 %.Body mass index is 28.21 kg/m.  General Appearance: Casual  Eye Contact:  None  Speech:  Slow  Volume:  Decreased  Mood:  Euthymic  Affect:  Blunt  Thought Process:  Irrelevant  Orientation:  Full (Time, Place, and Person)  Thought Content:  Delusions, Hallucinations: Auditory and Paranoid Ideation  Suicidal Thoughts:  No  Homicidal Thoughts:  No  Memory:  Immediate;   Poor Recent;   Poor Remote;   Poor  Judgement:  Poor  Insight:  Lacking  Psychomotor Activity:  Psychomotor Retardation  Concentration:   Concentration: Poor and Attention Span: Poor  Recall:  Poor  Fund of Knowledge:  Poor  Language:  Poor  Akathisia:  No  Handed:  Right  AIMS (if indicated):     Assets:  Communication Skills Desire for Improvement Financial Resources/Insurance Housing Physical Health Resilience Social Support  ADL's:  Intact  Cognition:  WNL  Sleep:  Number of Hours: 3.45     Treatment Plan Summary: Daily contact with patient to assess and evaluate symptoms and progress in treatment and Medication management   [name] [name] is a 23 year old male with a history of treatment resistant schizophrenia admitted for another psychotic break in the context of treatment noncompliance.   #Refusal of medications -will ask Dr. Toni Amendlapacs for second opinion and start forced medications  #Catatonia -Ativan is available  #Psychosis -restart Clozapine 300 mg nightly, clozapine level on admission: none -Halsdol 10 mg nightly -Luvox 25 mg nightly for augmentation -Depakote 500 mg BID -Restoril 15 mg nightly  #Tachycardia -Metoprolol 12.5 mg BID  #Metabolic syndrome prevention -Metformin 500 mg BID  #Labs -lipid panel, TSH. A1C -EKG  #Disposition -discharge with the mother -[facility]  Kristine LineaJolanta Samantha Olivera, MD 08/04/2018, 1:16 PM

## 2018-08-04 NOTE — Progress Notes (Signed)
[facility] Second Physician Opinion Progress Note for Medication Administration to Non-consenting Patients (For Involuntarily Committed Patients)  Patient: [name] [name] Date of Birth: 03-16-1996 MRN: [id number]  Reason for the Medication: The patient, without the benefit of the specific treatment measure, is incapable of participating in any available treatment plan that will give the patient a realistic opportunity of improving the patient's condition.  Consideration of Side Effects: Consideration of the side effects related to the medication plan has been given.  Rationale for Medication Administration: Patient is psychotic and has no insight.  Disorganized unrealistic delusional thinking.  Experience with this patient demonstrates that he is unable to get well without medication and he is currently refusing entirely based on his own poor insight.  Justification for forcing medicines outweighs any risks.    Mordecai Rasmussen, MD 08/04/18  3:52 PM   This documentation is good for (7) seven days from the date of the MD signature. New documentation must be completed every seven (7) days with detailed justification in the medical record if the patient requires continued non-emergent administration of psychotropic medications.

## 2018-08-04 NOTE — Progress Notes (Signed)
Recreation Therapy Notes  Date:08/04/2018  Time:9:30 am  Location:Craft room  Behavioral response:N/A  Intervention Topic: Goals  Discussion/Intervention: Patient did not attend group.  Clinical Observations/Feedback:  Patient did not attend group.  Eri Mcevers LRT/CTRS        Cindia Hustead 08/04/2018 10:39 AM 

## 2018-08-04 NOTE — [facility] Group Notes (Signed)
Feelings Around Diagnosis 08/04/2018 1PM  Type of Therapy/Topic:  Group Therapy:  Feelings about Diagnosis  Participation Level:  Did Not Attend   Description of Group:   This group will allow patients to explore their thoughts and feelings about diagnoses they have received. Patients will be guided to explore their level of understanding and acceptance of these diagnoses. Facilitator will encourage patients to process their thoughts and feelings about the reactions of others to their diagnosis and will guide patients in identifying ways to discuss their diagnosis with significant others in their lives. This group will be process-oriented, with patients participating in exploration of their own experiences, giving and receiving support, and processing challenge from other group members.   Therapeutic Goals: 1. Patient will demonstrate understanding of diagnosis as evidenced by identifying two or more symptoms of the disorder 2. Patient will be able to express two feelings regarding the diagnosis 3. Patient will demonstrate their ability to communicate their needs through discussion and/or role play  Summary of Patient Progress:       Therapeutic Modalities:   Cognitive Behavioral Therapy Brief Therapy Feelings Identification    [name] [name] T Liz Pinho, LCSW 08/04/2018 1:59 PM  

## 2018-08-04 NOTE — BHH Counselor (Signed)
Adult Comprehensive Assessment  Patient ID: [name] [name], male   DOB: [birth year], 23 y.o.   MRN: [id number]  Information Source: Information source: Patient, chart review   Current Stressors:  Patient states their primary concerns and needs for treatment are:: "I don't know" Patient states their goals for this hospitalization and ongoing recovery are:: "I don't know" Educational / Learning stressors: None reported Employment / Job issues: Unemployed Family Relationships: Pt lives with his mother and brother Surveyor, quantity / Lack of resources (include bankruptcy): Pt relies on his mother for financial support. Housing / Lack of housing: Stable housing Physical health (include injuries & life threatening diseases): None reported Social relationships: No response Substance abuse: Pt denies Bereavement / Loss: None reported   Living/Environment/Situation:  Living Arrangements: Mother Living conditions (as described by patient or guardian): "It's okay" Who else lives in the home?: pt's mother and brother How long has patient lived in current situation?: "20 years" What is atmosphere in current home: None reported   Family History:  Marital status: Single Are you sexually active?: No What is your sexual orientation?: Heterosexual Has your sexual activity been affected by drugs, alcohol, medication, or emotional stress?: No Does patient have children?: No   Childhood History:  By whom was/is the patient raised?: Mother Additional childhood history information: pt's family resides in [place], [place].  Pt mother and brother are only relatives who live in the [place]. Description of patient's relationship with caregiver when they were a child: Pt states "I get along with my mother" Patient's description of current relationship with people who raised him/her: "I get along with my mother" How were you disciplined when you got in trouble as a child/adolescent?: Punished Does patient have  siblings?: Yes Number of Siblings: 1 Description of patient's current relationship with siblings: Pt has a younger brother.    Did patient suffer any verbal/emotional/physical/sexual abuse as a child?: No Did patient suffer from severe childhood neglect?: No Has patient ever been sexually abused/assaulted/raped as an adolescent or adult?: No Was the patient ever a victim of a crime or a disaster?: No Witnessed domestic violence?: No Has patient been effected by domestic violence as an adult?: No   Education:  Highest grade of school patient has completed: 12th Currently a student?: Yes, pt says he attends [employer], wants to major in architecture Learning disability?: No   Employment/Work Situation:   Employment situation: Unemployed, pt says he does not receive disability. Patient's job has been impacted by current illness: (n/a) What is the longest time patient has a held a job?: 1 year Where was the patient employed at that time?: "I don't remember" Did You Receive Any Psychiatric Treatment/Services While in the Military?: (n/a) Are There Guns or Other Weapons in Your Home?: No Are These Weapons Safely Secured?: Pt denies access to guns or Archivist Resources:   Financial resources: Support from parents / caregiver, Medicaid Does patient have a Lawyer or guardian?: No   Alcohol/Substance Abuse:   What has been your use of drugs/alcohol within the last 12 months?: Pt denies any drug or alcohol use If attempted suicide, did drugs/alcohol play a role in this?: No Alcohol/Substance Abuse Treatment Hx: Denies past history If yes, describe treatment: n/a Has alcohol/substance abuse ever caused legal problems?: No   Social Support System:   Conservation officer, nature Support System: Fair Museum/gallery exhibitions officer System:  mother.   Type of faith/religion: Ephriam Knuckles How does patient's faith help to cope with current illness?: None reported  Leisure/Recreation:    Leisure and Hobbies: "I'm not sure"  Strengths/Needs:   What is the patient's perception of their strengths?: No response Patient states they can use these personal strengths during their treatment to contribute to their recovery: "I'm not sure" Patient states these barriers may affect/interfere with their treatment: None reported Patient states these barriers may affect their return to the community: None reported Other important information patient would like considered in planning for their treatment: None reported   Discharge Plan:   Currently receiving community mental health services: No response, however pt [facility]. Patient states concerns and preferences for aftercare planning are: None reported Patient states they will know when they are safe and ready for discharge when: "When I'm feeling better" Does patient have access to transportation?: Yes Does patient have financial barriers related to discharge medications?: No Patient description of barriers related to discharge medications: None reported Will patient be returning to same living situation after discharge?: Yes   Summary/Recommendations:   Summary and Recommendations (to be completed by the evaluator): Pt is a 23 yo male with a hx of schizophrenia. Chart review reveals  patient was brought to the hospital by local police. Mother encouraged hospitalization due to pt  refusing medications, running away from home and getting into an altercation with her. [facility] was [facility] back to [facility] at [facility]. Pt states his psychiatrist is Dr. Clarisa [name]. At discharge, patient will return home and attend outpatient treatment. While here, patient will benefit from crisis stabilization, medication evaluation, group therapy and psychoeducation. In addition, it is recommended that patient remain compliant with the established discharge plan and continue treatment. [name] [name] T  [name] [name]. 08/04/2018

## 2018-08-04 NOTE — Progress Notes (Signed)
Patient  is not sleeping now, stating that I slept all day and now I don't feel any sleep, patient continues to refuse all his medications and respond by stating,' I have the right to refuse'. Patient is calm none aggressive or violent and maintaining safety, isolate self In the room most of the time only out for meals. Support and encouragement is provided to improve emotional and mental state.patient denies SI/HI/AVH and 15 minutes safety checks is continued no distress.

## 2018-08-04 NOTE — Progress Notes (Signed)
Recreation Therapy Notes  INPATIENT RECREATION THERAPY ASSESSMENT  Patient Details Name: [name] [name] MRN: [id number] DOB: Feb 08, 1996 Today's Date: 08/04/2018       Information Obtained From: (Patient refused assessment)  Able to Participate in Assessment/Interview:    Patient Presentation:    Reason for Admission (Per Patient):    Patient Stressors:    Coping Skills:      Leisure Interests (2+):     Frequency of Recreation/Participation:    Awareness of Community Resources:     [facility]:     Current Use:    If no, Barriers?:    Expressed Interest in [employer] Information:    [place] of Residence:     Patient Main Form of Transportation:    Patient Strengths:     Patient Identified Areas of Improvement:     Patient Goal for Hospitalization:     Current SI (including self-harm):     Current HI:     Current AVH:    Staff Intervention Plan:    Consent to Intern Participation:    [name] [name] 08/04/2018, 11:58 AM

## 2018-08-05 NOTE — Plan of Care (Signed)
D: Patient is pacing in the halls. Pleasant upon approach. Denies SI/HI and A/V hallucinations. Has thought blocking. Appears slightly guarded. Compliant with medications. A: Offer support and continue to monitor. R: Safety maintained.

## 2018-08-05 NOTE — Progress Notes (Addendum)
Recreation Therapy Notes   Date: 08/05/2018  Time: 9:30 am  Location: Craft Room  Behavioral response: Appropriate, Redirected  Intervention Topic: Coping  Discussion/Intervention:  Group content on today was focused on coping skills. The group defined what coping skills are and when they can be used. Individuals described how they normally cope with thing and the coping skills they normally use. Patients expressed why it is important to cope with things and how not coping with things can affect you. The group participated in the intervention "My coping box" and made coping boxes while adding coping skills they could use in the future to the box. Clinical Observations/Feedback:  Patient came to group and defined coping as a way to deal with things. He stated that he watches movies and hangs with friends as his coping skills. Individual was social with peers and staff while participating in the intervention. Patient was redirected several time to focus on task at hand and respect personal boundaries of peers and staff. Ayvin Lipinski LRT/CTRS         Kawon Willcutt 08/05/2018 12:37 PM

## 2018-08-05 NOTE — Plan of Care (Signed)
  Problem: Activity: Goal: Will verbalize the importance of balancing activity with adequate rest periods Outcome: Progressing  Patient understands importance of balancing rest and activities

## 2018-08-05 NOTE — Progress Notes (Signed)
D: Patient has been pacing on the unit. Non-agitated. Pleasant upon approach. Appears to have some thought-blocking. Denies SI/HI and AV hallucinations. Interacting appropriately with staff. A: Will continue to monitor for safety and offer support. R: Safety maintained

## 2018-08-05 NOTE — [facility] Group Notes (Signed)
LCSW Group Therapy Note  08/05/2018 1:00 PM  Type of Therapy/Topic:  Group Therapy:  Emotion Regulation  Participation Level:  Did Not Attend   Description of Group:   The purpose of this group is to assist patients in learning to regulate negative emotions and experience positive emotions. Patients will be guided to discuss ways in which they have been vulnerable to their negative emotions. These vulnerabilities will be juxtaposed with experiences of positive emotions or situations, and patients will be challenged to use positive emotions to combat negative ones. Special emphasis will be placed on coping with negative emotions in conflict situations, and patients will process healthy conflict resolution skills.  Therapeutic Goals: 1. Patient will identify two positive emotions or experiences to reflect on in order to balance out negative emotions 2. Patient will label two or more emotions that they find the most difficult to experience 3. Patient will demonstrate positive conflict resolution skills through discussion and/or role plays  Summary of Patient Progress:       Therapeutic Modalities:   Cognitive Behavioral Therapy Feelings Identification Dialectical Behavioral Therapy  Penni Homans, MSW, LCSW 08/05/2018 2:49 PM

## 2018-08-05 NOTE — Tx Team (Addendum)
Interdisciplinary Treatment and Diagnostic Plan Update  08/05/2018 Time of Session: 1030am [name] [name] MRN: [id number]  Principal Diagnosis: Schizoaffective disorder, bipolar type [facility])  Secondary Diagnoses: Principal Problem:   Schizoaffective disorder, bipolar type ([place])   Current Medications:  Current Facility-Administered Medications  Medication Dose Route Frequency Provider Last Rate Last Dose  . acetaminophen (TYLENOL) tablet 650 mg  650 mg Oral Q6H PRN Lavella Hammock, MD      . alum & mag hydroxide-simeth (MAALOX/MYLANTA) 200-200-20 MG/5ML suspension 30 mL  30 mL Oral Q4H PRN Lavella Hammock, MD      . cloZAPine (CLOZARIL) tablet 300 mg  300 mg Oral QHS Lavella Hammock, MD      . diphenhydrAMINE (BENADRYL) capsule 50 mg  50 mg Oral QHS PRN Lavella Hammock, MD      . divalproex (DEPAKOTE) DR tablet 500 mg  500 mg Oral Q12H Lavella Hammock, MD   500 mg at 08/05/18 0835  . fluvoxaMINE (LUVOX) tablet 25 mg  25 mg Oral QHS Lavella Hammock, MD      . haloperidol (HALDOL) tablet 10 mg  10 mg Oral QHS Lavella Hammock, MD      . LORazepam (ATIVAN) tablet 1 mg  1 mg Oral Q4H PRN Lavella Hammock, MD      . magnesium hydroxide (MILK OF MAGNESIA) suspension 30 mL  30 mL Oral Daily PRN Lavella Hammock, MD      . metFORMIN (GLUCOPHAGE) tablet 500 mg  500 mg Oral Q breakfast Lavella Hammock, MD   500 mg at 08/05/18 0835  . metoprolol tartrate (LOPRESSOR) tablet 12.5 mg  12.5 mg Oral BID Lavella Hammock, MD   12.5 mg at 08/05/18 0835  . traZODone (DESYREL) tablet 100 mg  100 mg Oral QHS PRN Lavella Hammock, MD       PTA Medications: Medications Prior to Admission  Medication Sig Dispense Refill Last Dose  . cloZAPine (CLOZARIL) 100 MG tablet Take 3 tablets (300 mg total) by mouth at bedtime. (Patient not taking: Reported on 08/03/2018) 90 tablet 1` Not Taking at Unknown time  . divalproex (DEPAKOTE) 500 MG DR tablet Take 1 tablet (500 mg total) by mouth every 12 (twelve) hours.  (Patient not taking: Reported on 08/03/2018) 60 tablet 1 Not Taking at Unknown time  . fluvoxaMINE (LUVOX) 25 MG tablet Take 1 tablet (25 mg total) by mouth at bedtime. (Patient not taking: Reported on 08/03/2018) 30 tablet 1 Not Taking at Unknown time  . haloperidol (HALDOL) 10 MG tablet Take 1 tablet (10 mg total) by mouth at bedtime. (Patient not taking: Reported on 08/03/2018) 30 tablet 1 Not Taking at Unknown time  . metFORMIN (GLUCOPHAGE) 500 MG tablet Take 1 tablet (500 mg total) by mouth daily with breakfast. (Patient not taking: Reported on 08/03/2018) 30 tablet 1 Not Taking at Unknown time  . metoprolol tartrate (LOPRESSOR) 25 MG tablet Take 0.5 tablets (12.5 mg total) by mouth 2 (two) times daily. (Patient not taking: Reported on 08/03/2018) 60 tablet 1 Not Taking at Unknown time  . traZODone (DESYREL) 100 MG tablet Take 1 tablet (100 mg total) by mouth at bedtime as needed for sleep. (Patient not taking: Reported on 08/03/2018) 30 tablet 1 Not Taking at Unknown time    Patient Stressors: Medication change or noncompliance  Patient Strengths: Ability for insight  Treatment Modalities: Medication Management, Group therapy, Case management,  1 to 1 session with clinician, Psychoeducation, Recreational therapy.  Physician Treatment Plan for Primary Diagnosis: Schizoaffective disorder, bipolar type (HCC) Long Term Goal(s): Improvement in symptoms so as ready for discharge Improvement in symptoms so as ready for discharge   Short Term Goals: Ability to identify changes in lifestyle to reduce recurrence of condition will improve Ability to verbalize feelings will improve Ability to disclose and discuss suicidal ideas Ability to demonstrate self-control will improve Ability to identify and develop effective coping behaviors will improve Ability to maintain clinical measurements within normal limits will improve Compliance with prescribed medications will improve Ability to identify triggers  associated with substance abuse/mental health issues will improve NA  Medication Management: Evaluate patient's response, side effects, and tolerance of medication regimen.  Therapeutic Interventions: 1 to 1 sessions, Unit Group sessions and Medication administration.  Evaluation of Outcomes: Not Met  Physician Treatment Plan for Secondary Diagnosis: Principal Problem:   Schizoaffective disorder, bipolar type (HCC)  Long Term Goal(s): Improvement in symptoms so as ready for discharge Improvement in symptoms so as ready for discharge   Short Term Goals: Ability to identify changes in lifestyle to reduce recurrence of condition will improve Ability to verbalize feelings will improve Ability to disclose and discuss suicidal ideas Ability to demonstrate self-control will improve Ability to identify and develop effective coping behaviors will improve Ability to maintain clinical measurements within normal limits will improve Compliance with prescribed medications will improve Ability to identify triggers associated with substance abuse/mental health issues will improve NA     Medication Management: Evaluate patient's response, side effects, and tolerance of medication regimen.  Therapeutic Interventions: 1 to 1 sessions, Unit Group sessions and Medication administration.  Evaluation of Outcomes: Not Met   RN Treatment Plan for Primary Diagnosis: Schizoaffective disorder, bipolar type (HCC) Long Term Goal(s): Knowledge of disease and therapeutic regimen to maintain health will improve  Short Term Goals: Ability to participate in decision making will improve, Ability to verbalize feelings will improve, Ability to identify and develop effective coping behaviors will improve and Compliance with prescribed medications will improve  Medication Management: RN will administer medications as ordered by provider, will assess and evaluate patient's response and provide education to patient for  prescribed medication. RN will report any adverse and/or side effects to prescribing provider.  Therapeutic Interventions: 1 on 1 counseling sessions, Psychoeducation, Medication administration, Evaluate responses to treatment, Monitor vital signs and CBGs as ordered, Perform/monitor CIWA, COWS, AIMS and Fall Risk screenings as ordered, Perform wound care treatments as ordered.  Evaluation of Outcomes: Not Met   LCSW Treatment Plan for Primary Diagnosis: Schizoaffective disorder, bipolar type (HCC) Long Term Goal(s): Safe transition to appropriate next level of care at discharge, Engage patient in therapeutic group addressing interpersonal concerns.  Short Term Goals: Engage patient in aftercare planning with referrals and resources  Therapeutic Interventions: Assess for all discharge needs, 1 to 1 time with Social worker, Explore available resources and support systems, Assess for adequacy in community support network, Educate family and significant other(s) on suicide prevention, Complete Psychosocial Assessment, Interpersonal group therapy.  Evaluation of Outcomes: Not Met   Progress in Treatment: Attending groups: Yes. Participating in groups: Yes. Taking medication as prescribed: No. pt did not take his nighttime medication Toleration medication: Yes. Family/Significant other contact made: No, will contact:  CSW will contact when pt gives consent Patient understands diagnosis: Yes. Discussing patient identified problems/goals with staff: Yes. Medical problems stabilized or resolved: Yes. Denies suicidal/homicidal ideation: Yes. Issues/concerns per patient self-inventory: No. Other: NA  New problem(s) identified: No, Describe:    None reported  New Short Term/Long Term Goal(s): "Leave as soon as possible"  Patient Goals:  "Leave as soon as possible" Pt says [place] [employer].  Discharge Plan or Barriers: Pt will return home and follow up  with [facility]  Reason for Continuation of Hospitalization: Medication stabilization  Estimated Length of Stay: 5-7 days  Recreational Therapy: Patient Stressors: N/A  Patient Goal: Patient will engage in interactions with peers and staff in pro-social manner at least 2x within 5 recreation therapy group sessions  Attendees: Patient: [name] [name] 08/05/2018 12:55 PM  Physician: Jolanta [name] MD 08/05/2018 12:55 PM  Nursing:  08/05/2018 12:55 PM  RN Care Manager: 08/05/2018 12:55 PM  Social Worker: Darren Livingston LCSW Michaela Stanfield LCSW 08/05/2018 12:55 PM  Recreational Therapist: Shay  LRT 08/05/2018 12:55 PM  Other:  08/05/2018 12:55 PM  Other:  08/05/2018 12:55 PM  Other: 08/05/2018 12:55 PM    Scribe for Treatment Team: DARREN T LIVINGSTON, LCSW 08/05/2018 12:55 PM 

## 2018-08-05 NOTE — Progress Notes (Signed)
Patient alert and oriented x 4, denies SI/HI/AVH but noted responding to internal stimuli. Patient's affect is flat but brightens upon approach, his thoughts are disorganized, speech is tangential, soft and non pressured, he avoids making eye contact with staff. Patient refused medication tonight and he isolated to his room. Staff encouraged him to come out of room but he refused , no distress noted 15 minutes safety checks maintained will continue to monitor.

## 2018-08-05 NOTE — Progress Notes (Signed)
[facility] MD Progress Note  08/05/2018 1:46 PM Elsworth LEAMON WENNBERG  MRN:  [id number]  Subjective:    Mr. Matsuda woke up manic today. He is loud, intrusive, hard to redirect, hyperactive, grandiose. He wants to be discharge immediately to attend a trade school in [place]. He tels [place] in treatment tean that he is an adult now and will decide by himself and does not need his overprotective mther. He promissed to take his medications last yesterday and kept his promise during the day. At might, he did noy get any of his important medications: Clozapine, Haldol or Luvox. Apparently, he was asleep.   Principal Problem: Schizoaffective disorder, bipolar type (HCC) Diagnosis: Principal Problem:   Schizoaffective disorder, bipolar type (HCC)  Total Time spent with patient: 20 minutes  Past Psychiatric History: schizoaffective disorder  Past Medical History:  Past Medical History:  Diagnosis Date  . Asthma   . Depression   . Psychosis [facility])     Past Surgical History:  Procedure Laterality Date  . BACK SURGERY     Family History: History reviewed. No pertinent family history. Family Psychiatric  History: none Social History:  Social History   Substance and Sexual Activity  Alcohol Use No     Social History   Substance and Sexual Activity  Drug Use Yes  . Types: Marijuana    Social History   Socioeconomic History  . Marital status: Single    Spouse name: Not on file  . Number of children: Not on file  . Years of education: Not on file  . Highest education level: Not on file  Occupational History  . Not on file  Social Needs  . Financial resource strain: Not on file  . Food insecurity:    Worry: Not on file    Inability: Not on file  . Transportation needs:    Medical: Not on file    Non-medical: Not on file  Tobacco Use  . Smoking status: Never Smoker  . Smokeless tobacco: Never Used  Substance and Sexual Activity  . Alcohol use: No  . Drug use: Yes    Types: Marijuana  .  Sexual activity: Not Currently  Lifestyle  . Physical activity:    Days per week: Not on file    Minutes per session: Not on file  . Stress: Not on file  Relationships  . Social connections:    Talks on phone: Not on file    Gets together: Not on file    Attends religious service: Not on file    Active member of club or organization: Not on file    Attends meetings of clubs or organizations: Not on file    Relationship status: Not on file  Other Topics Concern  . Not on file  Social History Narrative  . Not on file   Additional Social History:                         Sleep: Fair  Appetite:  Fair  Current Medications: Current Facility-Administered Medications  Medication Dose Route Frequency Provider Last Rate Last Dose  . acetaminophen (TYLENOL) tablet 650 mg  650 mg Oral Q6H PRN Mariel Craft, MD      . alum & mag hydroxide-simeth (MAALOX/MYLANTA) 200-200-20 MG/5ML suspension 30 mL  30 mL Oral Q4H PRN Mariel Craft, MD      . cloZAPine (CLOZARIL) tablet 300 mg  300 mg Oral QHS Mariel Craft, MD      .  diphenhydrAMINE (BENADRYL) capsule 50 mg  50 mg Oral QHS PRN Mariel CraftMaurer, Sheila M, MD      . divalproex (DEPAKOTE) DR tablet 500 mg  500 mg Oral Q12H Mariel CraftMaurer, Sheila M, MD   500 mg at 08/05/18 0835  . fluvoxaMINE (LUVOX) tablet 25 mg  25 mg Oral QHS Mariel CraftMaurer, Sheila M, MD      . haloperidol (HALDOL) tablet 10 mg  10 mg Oral QHS Mariel CraftMaurer, Sheila M, MD      . LORazepam (ATIVAN) tablet 1 mg  1 mg Oral Q4H PRN Mariel CraftMaurer, Sheila M, MD      . magnesium hydroxide (MILK OF MAGNESIA) suspension 30 mL  30 mL Oral Daily PRN Mariel CraftMaurer, Sheila M, MD      . metFORMIN (GLUCOPHAGE) tablet 500 mg  500 mg Oral Q breakfast Mariel CraftMaurer, Sheila M, MD   500 mg at 08/05/18 0835  . metoprolol tartrate (LOPRESSOR) tablet 12.5 mg  12.5 mg Oral BID Mariel CraftMaurer, Sheila M, MD   12.5 mg at 08/05/18 0835  . traZODone (DESYREL) tablet 100 mg  100 mg Oral QHS PRN Mariel CraftMaurer, Sheila M, MD        Lab Results: No results  found for this or any previous visit (from the past 48 hour(s)).  Blood Alcohol level:  Lab Results  Component Value Date   ETH <10 08/02/2018   ETH <10 05/19/2018    Metabolic Disorder Labs: Lab Results  Component Value Date   HGBA1C 4.9 03/08/2018   MPG 93.93 03/08/2018   MPG 85.32 12/30/2017   No results found for: PROLACTIN Lab Results  Component Value Date   CHOL 238 (H) 03/08/2018   TRIG 69 03/08/2018   HDL 56 03/08/2018   CHOLHDL 4.3 03/08/2018   VLDL 14 03/08/2018   LDLCALC 168 (H) 03/08/2018   LDLCALC 97 12/30/2017    Physical Findings: AIMS:  , ,  ,  ,    CIWA:    COWS:     Musculoskeletal: Strength & Muscle Tone: within normal limits Gait & Station: normal Patient leans: N/A  Psychiatric Specialty Exam: Physical Exam  Nursing note and vitals reviewed. Psychiatric: His affect is labile and inappropriate. His speech is rapid and/or pressured. He is hyperactive and actively hallucinating. Thought content is paranoid and delusional. Cognition and memory are impaired. He expresses impulsivity.    Review of Systems  Neurological: Negative.   Psychiatric/Behavioral: Positive for hallucinations. The patient has insomnia.   All other systems reviewed and are negative.   Blood pressure 109/77, pulse 90, temperature 98.2 F (36.8 C), temperature source Oral, resp. rate 16, height 6' (1.829 m), weight 94.3 kg, SpO2 95 %.Body mass index is 28.21 kg/m.  General Appearance: Casual  Eye Contact:  Good  Speech:  Pressured  Volume:  Increased  Mood:  Dysphoric and Irritable  Affect:  Congruent  Thought Process:  Disorganized, Irrelevant and Descriptions of Associations: Tangential  Orientation:  Full (Time, Place, and Person)  Thought Content:  Delusions and Paranoid Ideation  Suicidal Thoughts:  No  Homicidal Thoughts:  No  Memory:  Immediate;   Fair Recent;   Fair Remote;   Fair  Judgement:  Poor  Insight:  Lacking  Psychomotor Activity:  Increased   Concentration:  Concentration: Fair and Attention Span: Fair  Recall:  [employer]Fair  Fund of Knowledge:  Fair  Language:  Fair  Akathisia:  No  Handed:  Right  AIMS (if indicated):     Assets:  Communication Skills Desire for Improvement  Financial Resources/Insurance Housing Physical Health Resilience Social Support  ADL's:  Intact  Cognition:  WNL  Sleep:  Number of Hours: 7     Treatment Plan Summary: Daily contact with patient to assess and evaluate symptoms and progress in treatment and Medication management   Mr. Perkinson is a 23 year old male with a history of treatment resistant schizophrenia admitted for another psychotic break in the context of treatment noncompliance.   #Refusal of medications -will ask Dr. Toni Amend for second opinion and start forced medications  #Catatonia, resolved  #Psychosis -restart Clozapine 300 mg nightly, clozapine level on admission: none -Halsdol 10 mg nightly -Luvox 25 mg nightly for augmentation -Depakote 500 mg BID -Restoril 15 mg nightly  #Tachycardia -Metoprolol 12.5 mg BID  #Metabolic syndrome prevention -Metformin 500 mg BID  #Labs -lipid panel, TSH. A1C -EKG  #Disposition -discharge with the mother -Follow up with [facility]  Kristine Linea, MD 08/05/2018, 1:46 PM

## 2018-08-06 NOTE — Progress Notes (Signed)
D: Pt denies SI/HI/AVH, contracts for safety. Pt. Reports a normal mood. Denies anxiety and/or depression. Pt. Has poor insight into his treatment. Pt. Adamant he does not need medications and states he will not take them when he leaves. Pt. Eye contact continues to be brief and less improved this evening. Pt is pleasant during interactions  with this Clinical research associatewriter, engages appropriately overall.   A: Q x 15 minute observation checks were completed for safety. Patient was provided with education, but is non-accepting of this. Patient was given/offered medications per orders. Patient  was encourage to attend groups, participate in unit activities and continue with plan of care. Pt. Chart and plans of care reviewed. Pt. Given support and encouragement.   R: Patient is complaint with medications with direction and encouragement. Pt. Does not participate around the unit this evening. Pt. Is very isolative and withdrawn to his room this evening. Pt. Came and grabbed a snack and ate good, but not sociable at all.             Precautionary checks every 15 minutes for safety maintained, room free of safety hazards, patient sustains no injury or falls during this shift. Will endorse care to next shift.

## 2018-08-06 NOTE — B[facility] Group Notes (Signed)
Balance In Life 08/06/2018 1PM  Type of Therapy/Topic:  Group Therapy:  Balance in Life  Participation Level:  Did Not Attend  Description of Group:   This group will address the concept of balance and how it feels and looks when one is unbalanced. Patients will be encouraged to process areas in their lives that are out of balance and identify reasons for remaining unbalanced. Facilitators will guide patients in utilizing problem-solving interventions to address and correct the stressor making their life unbalanced. Understanding and applying boundaries will be explored and addressed for obtaining and maintaining a balanced life. Patients will be encouraged to explore ways to assertively make their unbalanced needs known to significant others in their lives, using other group members and facilitator for support and feedback.  Therapeutic Goals: 1. Patient will identify two or more emotions or situations they have that consume much of in their lives. 2. Patient will identify signs/triggers that life has become out of balance:  3. Patient will identify two ways to set boundaries in order to achieve balance in their lives:  4. Patient will demonstrate ability to communicate their needs through discussion and/or role plays  Summary of Patient Progress:    Therapeutic Modalities:   Cognitive Behavioral Therapy Solution-Focused Therapy Assertiveness Training  Appolonia Ackert T Marylu Dudenhoeffer, LCSW  

## 2018-08-06 NOTE — Plan of Care (Signed)
Pt. Reports normal mood and improvement in emotional and mental status. Pt. Denies si/hi/avh, can contract for safety. Pt. Shows no observations of responding to internal stimuli this shift. Pt. Non-accepting of education provided and has poor insight into his medication regime and need for medications.    Problem: Education: Goal: Knowledge of [facility] Education information/materials will improve Outcome: Not Progressing   Problem: Education: Goal: Emotional status will improve Outcome: Progressing Goal: Mental status will improve Outcome: Progressing   Problem: Health Behavior/Discharge Planning: Goal: Compliance with treatment plan for underlying cause of condition will improve Outcome: Progressing   Problem: Safety: Goal: Ability to remain free from injury will improve Outcome: Progressing

## 2018-08-06 NOTE — Progress Notes (Signed)
Recreation Therapy Notes  Date: 08/06/2018  Time: 9:30 am   Location: Craft room   Behavioral response: N/A   Intervention Topic: Happiness  Discussion/Intervention: Patient did not attend group.   Clinical Observations/Feedback:  Patient did not attend group.   [name] [name] LRT/CTRS        [name] [name] 08/06/2018 10:51 AM 

## 2018-08-06 NOTE — Plan of Care (Signed)
Pt. Complaint with medications. Pt. Denies si/hi/avh, can contract for safety. Pt. Eye contact is a bit improved. Pt. Able to remains safe while on the unit. Pt. Does not participate around the unit. Pt. Interactions are poor. Pt. Isolative and withdrawn to room often.     Problem: Coping: Goal: Ability to use eye contact when communicating with others will improve Outcome: Progressing   Problem: Health Behavior/Discharge Planning: Goal: Compliance with prescribed medication regimen will improve Outcome: Progressing   Problem: Safety: Goal: Ability to remain free from injury will improve Outcome: Progressing   Problem: Coping: Goal: Ability to interact with others will improve Outcome: Not Progressing   Problem: Health Behavior/Discharge Planning: Goal: Compliance with treatment plan for underlying cause of condition will improve Outcome: Not Progressing

## 2018-08-06 NOTE — Progress Notes (Signed)
[facility] MD Progress Note  08/06/2018 12:46 AM Kamarri REMER COUSE  MRN:  [id number]  Subjective:    Mr. Arbutus Ped took all his medications last night. He reports no side effects. He is still hyperactive and euphoric.  Spoke with ACT team representative that in spite of twice daily visit to the house, the patient started refusing medications one month ago, started behaving strangely barricading himself in the bathroom.  Principal Problem: Schizoaffective disorder, bipolar type (HCC) Diagnosis: Principal Problem:   Schizoaffective disorder, bipolar type (HCC)  Total Time spent with patient: 20 minutes  Past Psychiatric History: schizophrenia  Past Medical History:  Past Medical History:  Diagnosis Date  . Asthma   . Depression   . Psychosis [facility])     Past Surgical History:  Procedure Laterality Date  . BACK SURGERY     Family History: History reviewed. No pertinent family history. Family Psychiatric  History: none Social History:  Social History   Substance and Sexual Activity  Alcohol Use No     Social History   Substance and Sexual Activity  Drug Use Yes  . Types: Marijuana    Social History   Socioeconomic History  . Marital status: Single    Spouse name: Not on file  . Number of children: Not on file  . Years of education: Not on file  . Highest education level: Not on file  Occupational History  . Not on file  Social Needs  . Financial resource strain: Not on file  . Food insecurity:    Worry: Not on file    Inability: Not on file  . Transportation needs:    Medical: Not on file    Non-medical: Not on file  Tobacco Use  . Smoking status: Never Smoker  . Smokeless tobacco: Never Used  Substance and Sexual Activity  . Alcohol use: No  . Drug use: Yes    Types: Marijuana  . Sexual activity: Not Currently  Lifestyle  . Physical activity:    Days per week: Not on file    Minutes per session: Not on file  . Stress: Not on file  Relationships  . Social  connections:    Talks on phone: Not on file    Gets together: Not on file    Attends religious service: Not on file    Active member of club or organization: Not on file    Attends meetings of clubs or organizations: Not on file    Relationship status: Not on file  Other Topics Concern  . Not on file  Social History Narrative  . Not on file   Additional Social History:                         Sleep: Fair  Appetite:  Fair  Current Medications: Current Facility-Administered Medications  Medication Dose Route Frequency Provider Last Rate Last Dose  . acetaminophen (TYLENOL) tablet 650 mg  650 mg Oral Q6H PRN Mariel Craft, MD      . alum & mag hydroxide-simeth (MAALOX/MYLANTA) 200-200-20 MG/5ML suspension 30 mL  30 mL Oral Q4H PRN Mariel Craft, MD      . cloZAPine (CLOZARIL) tablet 300 mg  300 mg Oral QHS Mariel Craft, MD   300 mg at 08/05/18 2151  . diphenhydrAMINE (BENADRYL) capsule 50 mg  50 mg Oral QHS PRN Mariel Craft, MD      . divalproex (DEPAKOTE) DR tablet 500 mg  500 mg  Oral Q12H Mariel CraftMaurer, Sheila M, MD   500 mg at 08/05/18 2036  . fluvoxaMINE (LUVOX) tablet 25 mg  25 mg Oral QHS Mariel CraftMaurer, Sheila M, MD   25 mg at 08/05/18 2152  . haloperidol (HALDOL) tablet 10 mg  10 mg Oral QHS Mariel CraftMaurer, Sheila M, MD   10 mg at 08/05/18 2151  . LORazepam (ATIVAN) tablet 1 mg  1 mg Oral Q4H PRN Mariel CraftMaurer, Sheila M, MD      . magnesium hydroxide (MILK OF MAGNESIA) suspension 30 mL  30 mL Oral Daily PRN Mariel CraftMaurer, Sheila M, MD      . metFORMIN (GLUCOPHAGE) tablet 500 mg  500 mg Oral Q breakfast Mariel CraftMaurer, Sheila M, MD   500 mg at 08/05/18 0835  . metoprolol tartrate (LOPRESSOR) tablet 12.5 mg  12.5 mg Oral BID Mariel CraftMaurer, Sheila M, MD   12.5 mg at 08/05/18 1723  . traZODone (DESYREL) tablet 100 mg  100 mg Oral QHS PRN Mariel CraftMaurer, Sheila M, MD   100 mg at 08/05/18 2151    Lab Results: No results found for this or any previous visit (from the past 48 hour(s)).  Blood Alcohol level:  Lab  Results  Component Value Date   ETH <10 08/02/2018   ETH <10 05/19/2018    Metabolic Disorder Labs: Lab Results  Component Value Date   HGBA1C 4.9 03/08/2018   MPG 93.93 03/08/2018   MPG 85.32 12/30/2017   No results found for: PROLACTIN Lab Results  Component Value Date   CHOL 238 (H) 03/08/2018   TRIG 69 03/08/2018   HDL 56 03/08/2018   CHOLHDL 4.3 03/08/2018   VLDL 14 03/08/2018   LDLCALC 168 (H) 03/08/2018   LDLCALC 97 12/30/2017    Physical Findings: AIMS:  , ,  ,  ,    CIWA:    COWS:     Musculoskeletal: Strength & Muscle Tone: within normal limits Gait & Station: normal Patient leans: N/A  Psychiatric Specialty Exam: Physical Exam  Nursing note and vitals reviewed. Psychiatric: His affect is labile. His speech is rapid and/or pressured. He is hyperactive. Thought content is delusional. Cognition and memory are impaired. He expresses impulsivity.    Review of Systems  Neurological: Negative.   Psychiatric/Behavioral: Positive for hallucinations.  All other systems reviewed and are negative.   Blood pressure 114/62, pulse 70, temperature 98.3 F (36.8 C), temperature source Oral, resp. rate 16, height 6' (1.829 m), weight 94.3 kg, SpO2 98 %.Body mass index is 28.21 kg/m.  General Appearance: Casual  Eye Contact:  Good  Speech:  Pressured  Volume:  Increased  Mood:  Euphoric  Affect:  Congruent  Thought Process:  Irrelevant  Orientation:  Full (Time, Place, and Person)  Thought Content:  Delusions, Hallucinations: Auditory and Paranoid Ideation  Suicidal Thoughts:  No  Homicidal Thoughts:  No  Memory:  Immediate;   Fair Recent;   Fair Remote;   Fair  Judgement:  Poor  Insight:  Lacking  Psychomotor Activity:  Increased  Concentration:  Concentration: Poor and Attention Span: Poor  Recall:  Poor  Fund of Knowledge:  Fair  Language:  Fair  Akathisia:  No  Handed:  Right  AIMS (if indicated):     Assets:  Communication Skills Desire for  Improvement Financial Resources/Insurance Housing Physical Health Resilience Social Support  ADL's:  Intact  Cognition:  WNL  Sleep:  Number of Hours: 7     Treatment Plan Summary: Daily contact with patient to assess and evaluate symptoms  and progress in treatment and Medication management    Mr. Hulan AmatoMustafa is a 23 year old male with a history of treatment resistant schizophrenia admitted for another psychotic break in the context of treatment noncompliance.  #Refusal of medications -will ask Dr. Toni Amendlapacs for second opinion and start forced medications  #Catatonia, resolved  #Psychosis -restart Clozapine 300 mg nightly, clozapine level on admission: none -Halsdol 10 mg nightly -Luvox 25 mg nightly for augmentation -Depakote 500 mg BID -Restoril 15 mg nightly  #Tachycardia -Metoprolol 12.5 mg BID  #Metabolic syndrome prevention -Metformin 500 mg BID  #Labs -lipid panel, TSH. A1C -EKG  #Disposition [facility] with the [facility]  Kristine LineaJolanta Linnea Todisco, MD 08/06/2018, 12:46 AM

## 2018-08-06 NOTE — Progress Notes (Signed)
[facility]  08/07/2018 9:45 AM [name] [name]  MRN:  [id number]  Subjective:    [name] [name] is a 23 year old male with a history of treatment resistant schizoaffecctive diorder doing better on Haldol, Clozapine, depakote, admitted floridly psychotic in the context of treatment noncompliance for the past month. Restarted on medication, accepts them now.   [name] [name] in bed with his head covered but sits up for me and is able to have a good conversation. He is cool and collected. He decided to take medications so he can return to school as planned. Reports no side effects or somatic problems. Sleep and appetite are fair. He reportedly spoe with his mother but I do not have a good number for her. As always, it is not easy to communicate because of her busy work schedule. She is very supportive.   We will continue all medications. Will check levels, including Clozapine, early next week.   Principal Problem: Schizoaffective disorder, bipolar type (HCC) Diagnosis: Principal Problem:   Schizoaffective disorder, bipolar type (HCC)  Total Time spent with patient: 20 minutes  Past Psychiatric History: schizoaffective disorder  Past Medical History:  Past Medical History:  Diagnosis Date  . Asthma   . Depression   . Psychosis [facility](HCC)     Past Surgical History:  Procedure Laterality Date  . BACK SURGERY     Family History: History reviewed. No pertinent family history. Family Psychiatric  History: none Social History:  Social History   Substance and Sexual Activity  Alcohol Use No     Social History   Substance and Sexual Activity  Drug Use Yes  . Types: Marijuana    Social History   Socioeconomic History  . Marital status: Single    Spouse name: Not on file  . Number of children: Not on file  . Years of education: Not on file  . Highest education level: Not on file  Occupational History  . Not on file  Social Needs  . Financial resource strain: Not on file  .  Food insecurity:    Worry: Not on file    Inability: Not on file  . Transportation needs:    Medical: Not on file    Non-medical: Not on file  Tobacco Use  . Smoking status: Never Smoker  . Smokeless tobacco: Never Used  Substance and Sexual Activity  . Alcohol use: No  . Drug use: Yes    Types: Marijuana  . Sexual activity: Not Currently  Lifestyle  . Physical activity:    Days per week: Not on file    Minutes per session: Not on file  . Stress: Not on file  Relationships  . Social connections:    Talks on phone: Not on file    Gets together: Not on file    Attends religious service: Not on file    Active member of club or organization: Not on file    Attends meetings of clubs or organizations: Not on file    Relationship status: Not on file  Other Topics Concern  . Not on file  Social History Narrative  . Not on file   Additional Social History:                         Sleep: Fair  Appetite:  Fair  Current Medications: Current Facility-Administered Medications  Medication Dose Route Frequency Provider Last Rate Last Dose  . acetaminophen (TYLENOL) tablet 650 mg  650 mg  Oral Q6H PRN Mariel Craft, MD      . alum & mag hydroxide-simeth (MAALOX/MYLANTA) 200-200-20 MG/5ML suspension 30 mL  30 mL Oral Q4H PRN Mariel Craft, MD      . cloZAPine (CLOZARIL) tablet 300 mg  300 mg Oral QHS Mariel Craft, MD   300 mg at 08/06/18 2139  . diphenhydrAMINE (BENADRYL) capsule 50 mg  50 mg Oral QHS PRN Mariel Craft, MD      . divalproex (DEPAKOTE) DR tablet 500 mg  500 mg Oral Q12H Mariel Craft, MD   500 mg at 08/07/18 6226  . fluvoxaMINE (LUVOX) tablet 25 mg  25 mg Oral QHS Mariel Craft, MD   25 mg at 08/06/18 2139  . haloperidol (HALDOL) tablet 10 mg  10 mg Oral QHS Mariel Craft, MD   10 mg at 08/06/18 2139  . LORazepam (ATIVAN) tablet 1 mg  1 mg Oral Q4H PRN Mariel Craft, MD      . magnesium hydroxide (MILK OF MAGNESIA) suspension 30 mL  30  mL Oral Daily PRN Mariel Craft, MD      . metFORMIN (GLUCOPHAGE) tablet 500 mg  500 mg Oral Q breakfast Mariel Craft, MD   500 mg at 08/07/18 3335  . metoprolol tartrate (LOPRESSOR) tablet 12.5 mg  12.5 mg Oral BID Mariel Craft, MD   12.5 mg at 08/07/18 4562  . traZODone (DESYREL) tablet 100 mg  100 mg Oral QHS PRN Mariel Craft, MD   100 mg at 08/06/18 2139    Lab Results: No results found for this or any previous visit (from the past 48 hour(s)).  Blood Alcohol level:  Lab Results  Component Value Date   ETH <10 08/02/2018   ETH <10 05/19/2018    Metabolic Disorder Labs: Lab Results  Component Value Date   HGBA1C 4.9 03/08/2018   MPG 93.93 03/08/2018   MPG 85.32 12/30/2017   No results found for: PROLACTIN Lab Results  Component Value Date   CHOL 238 (H) 03/08/2018   TRIG 69 03/08/2018   HDL 56 03/08/2018   CHOLHDL 4.3 03/08/2018   VLDL 14 03/08/2018   LDLCALC 168 (H) 03/08/2018   LDLCALC 97 12/30/2017    Physical Findings: AIMS:  , ,  ,  ,    CIWA:    COWS:     Musculoskeletal: Strength & Muscle Tone: within normal limits Gait & Station: normal Patient leans: N/A  Psychiatric Specialty Exam: Physical Exam  Nursing note and vitals reviewed. Psychiatric: His speech is normal. His affect is labile. He is hyperactive and actively hallucinating. Thought content is paranoid and delusional. Cognition and memory are normal. He expresses impulsivity.    Review of Systems  Neurological: Negative.   Psychiatric/Behavioral: Negative.   All other systems reviewed and are negative.   Blood pressure 112/74, pulse 90, temperature 98.4 F (36.9 C), temperature source Oral, resp. rate 18, height 6' (1.829 m), weight 94.3 kg, SpO2 100 %.Body mass index is 28.21 kg/m.  General Appearance: Casual  Eye Contact:  Fair  Speech:  Clear and Coherent  Volume:  Normal  Mood:  Euphoric  Affect:  Labile  Thought Process:  Irrelevant  Orientation:  Full (Time,  Place, and Person)  Thought Content:  Delusions, Hallucinations: Auditory and Paranoid Ideation  Suicidal Thoughts:  No  Homicidal Thoughts:  No  Memory:  Immediate;   Fair Recent;   Fair Remote;   Fair  Judgement:  Poor  Insight:  Lacking  Psychomotor Activity:  Increased  Concentration:  Concentration: Fair and Attention Span: Fair  Recall:  [employer]Fair  Fund of Knowledge:  Fair  Language:  Fair  Akathisia:  No  Handed:  Right  AIMS (if indicated):     Assets:  Manufacturing systems engineerCommunication Skills Physical Health Resilience Social Support  ADL's:  Intact  Cognition:  WNL  Sleep:  Number of Hours: 7.3     Treatment Plan Summary: Daily contact with patient to assess and evaluate symptoms and progress in treatment and Medication management   [name] [name] is a 23 year old male with a history of treatment resistant schizophrenia admitted for another psychotic break in the context of treatment noncompliance.  #Catatonia, resolved  #Psychosis -restart Clozapine 300 mg nightly, clozapine level on admission: none -Haldol 10 mg nightly -Luvox 25 mg nightly for augmentation of Clozapine -Depakote 500 mg BID -Restoril 15 mg nightly  #Tachycardia -Metoprolol 12.5 mg BID  #Metabolic syndrome prevention -Metformin 500 mg BID  #Labs -lipid panel, TSH. A1C -EKG  #Disposition -discharge with the mother -[facility]  Kristine LineaJolanta , MD 08/07/2018, 9:45 AM

## 2018-08-06 NOTE — Plan of Care (Signed)
Patient in the day room upon my arrival to the unit. Affect flat with a blunted mood. Denies hearing voices, SI/HI and pain at this time. In bed majority of the shift resting with his eyes closed. Compliant with treatment plan. Minimal interaction with his peers. Milieu remains safe with q 15 minute safety checks.

## 2018-08-06 NOTE — Progress Notes (Signed)
D: Pt denies SI/HI/AVH, can contract for safety. Pt. Isolative and withdrawn to room during this shift. Pt. Reports normal mood. Pt. Reports, "I'm fine". Pt. Denies depression and anxiety. Pt. Affect flat and constricted.   A: Q x 15 minute observation checks were completed for safety. Patient was provided with education, but needs reinforcement. Patient was given/offered medications per orders. Patient  was encourage to attend groups, participate in unit activities and continue with plan of care. Pt. Chart and plans of care reviewed. Pt. Given support and encouragement.   R: Patient is complaint with medication. Pt. Does not participate in snack time or group.             Precautionary checks every 15 minutes for safety maintained, room free of safety hazards, patient sustains no injury or falls during this shift. Will endorse care to next shift.

## 2018-08-07 NOTE — Progress Notes (Signed)
D - Patient was in his room upon arrival to the unit. Patient was pleasant during assessment. Patient refused medications this evening. Patient approached in his room by this writer, patient asked to come take medications. Patient came to the med room and said, "I should just refuse medications tonight." Patient asked why, but didn't respond. Patient then said, "I will take all 4 of my meds." This writer started to pull medications and the patient stated, "I am refusing these meds, thank you I am going to bed." Patient denies SI/HI/AVH and pain. Patient stated his depression and anxiety have been getting better.   A - Patient refused medications this evening. Patient was isolative to his room other than getting snack. Patient given education on medications. Patient given support and encouragement. Patient informed to let staff know if there are any issues on the unit.   R - Patient being monitored Q 15 minutes for safety per unit protocol. Patient remains safe on the unit at this time.

## 2018-08-07 NOTE — Plan of Care (Signed)
Patient non compliant with medication administration, stating, "I am just going to refused these medications tonight, thank you." Patient isolative to room, only came out for snack.   Problem: Coping: Goal: Ability to interact with others will improve Outcome: Not Progressing Goal: Demonstration of participation in decision-making regarding own care will improve Outcome: Not Progressing

## 2018-08-07 NOTE — Progress Notes (Signed)
Recreation Therapy Notes   Date: 08/07/2018  Time: 9:30 am   Location: Craft room   Behavioral response: N/A   Intervention Topic: Self-esteem  Discussion/Intervention: Patient did not attend group.   Clinical Observations/Feedback:  Patient did not attend group.   Gurney Balthazor LRT/CTRS        [name] [name] 08/07/2018 10:25 AM 

## 2018-08-07 NOTE — Plan of Care (Signed)
Patient in the day room upon my arrival to the unit. Affect flat with a blunted mood. Denies hearing voices, SI/HI and pain at this time. In bed majority of the shift resting with his eyes closed. Patient a little tachycardic this morning upon arrival tot he unit, MD notifed per the night shift nurse. Pulse rechecked per this writer and pulse decreased slightly. Will continue to monitor pulse. Compliant with treatment plan. Minimal interaction with his peers. Milieu remains safe with q 15 minute safety checks.

## 2018-08-07 NOTE — [facility] Group Notes (Signed)
LCSW Group Therapy Note  08/07/2018 1:00 PM  Type of Therapy and Topic:  Group Therapy:  Feelings around Relapse and Recovery  Participation Level:  Did Not Attend   Description of Group:    Patients in this group will discuss emotions they experience before and after a relapse. They will process how experiencing these feelings, or avoidance of experiencing them, relates to having a relapse. Facilitator will guide patients to explore emotions they have related to recovery. Patients will be encouraged to process which emotions are more powerful. They will be guided to discuss the emotional reaction significant others in their lives may have to their relapse or recovery. Patients will be assisted in exploring ways to respond to the emotions of others without this contributing to a relapse.  Therapeutic Goals: 1. Patient will identify two or more emotions that lead to a relapse for them 2. Patient will identify two emotions that result when they relapse 3. Patient will identify two emotions related to recovery 4. Patient will demonstrate ability to communicate their needs through discussion and/or role plays   Summary of Patient Progress:     Therapeutic Modalities:   Cognitive Behavioral Therapy Solution-Focused Therapy Assertiveness Training Relapse Prevention Therapy   Penni Homans, MSW, LCSW 08/07/2018 1:55 PM

## 2018-08-08 LAB — TSH: TSH: 1.378 u[IU]/mL (ref 0.350–4.500)

## 2018-08-08 NOTE — [facility] Group Notes (Signed)
LCSW Group Therapy Note  08/08/2018 1:15pm  Type of Therapy and Topic:  Group Therapy:  Cognitive Distortions  Participation Level:  Did Not Attend   Description of Group:    Patients in this group will be introduced to the topic of cognitive distortions.  Patients will identify and describe cognitive distortions, describe the feelings these distortions create for them.  Patients will identify one or more situations in their personal life where they have cognitively distorted thinking and will verbalize challenging this cognitive distortion through positive thinking skills.  Patients will practice the skill of using positive affirmations to challenge cognitive distortions using affirmation cards.    Therapeutic Goals:  1. Patient will identify two or more cognitive distortions they have used 2. Patient will identify one or more emotions that stem from use of a cognitive distortion 3. Patient will demonstrate use of a positive affirmation to counter a cognitive distortion through discussion and/or role play. 4. Patient will describe one way cognitive distortions can be detrimental to wellness   Summary of Patient Progress: Pt was invited to attend group but chose not to attend. CSW will continue to encourage pt to attend group throughout their admission.      Therapeutic Modalities:   Cognitive Behavioral Therapy Motivational Interviewing   Dacari Beckstrand  CUEBAS-COLON, LCSW 08/08/2018 12:46 PM

## 2018-08-08 NOTE — Progress Notes (Signed)
D: Pt denies SI/HI/AVH, can contract for safety. Pt is pleasant and cooperative, engages appropriately. Pt. Eye contact improved. Pt. Expresses he wants double portion, md order given for this. Patient Interactions appropriate. No observations of responding to internal stimuli, but patient paces in the hallways frequently.  A: Q x 15 minute observation checks were completed for safety. Patient was provided with education, but needs reinforcement. Patient was given/offered medications per orders. Patient  was encourage to attend groups, participate in unit activities and continue with plan of care. Pt. Chart and plans of care reviewed. Pt. Given support and encouragement.   R: Patient is complaint with medication and unit procedures. Pt. Eating good, up for meals. Pt. Did not complete self-inventory.             Precautionary checks every 15 minutes for safety maintained, room free of safety hazards, patient sustains no injury or falls during this shift. Will endorse care to next shift.

## 2018-08-08 NOTE — Plan of Care (Signed)
Pt. Complaint with medications. Pt. Interactions with staff and peers more appropriate. Pt. Denies si/hi/avh, can contract for safety.    Problem: Health Behavior/Discharge Planning: Goal: Compliance with treatment plan for underlying cause of condition will improve Outcome: Progressing   Problem: Health Behavior/Discharge Planning: Goal: Compliance with prescribed medication regimen will improve Outcome: Progressing   Problem: Role Relationship: Goal: Ability to interact with others will improve Outcome: Progressing   Problem: Safety: Goal: Ability to remain free from injury will improve Outcome: Progressing

## 2018-08-08 NOTE — Progress Notes (Signed)
[name]  08/08/2018 10:58 AM [name] [name]  MRN:  [id number]  Subjective:   [name] [name] is a 23 year old male with a history of treatment resistant schizoaffecctive disorder doing better on Haldol, Clozapine, depakote, admitted floridly psychotic in the context of treatment noncompliance for the past month. Restarted on medication, which [name] [name] is compliant now.    Pt seen and chart reviewed.  [name] [name] reports feeling better, denied any side effects. [name] [name] doesn't seem to be oversedated.  [name] [name] did not want to talk to the writer much, and only requested to have double portion of his meals. (need monitor the metabolic side effects of his meds).   RN: [name] [name] has been cooperative with care, with minimal agitation.   Principal Problem: Schizoaffective disorder, bipolar type (HCC) Diagnosis: Principal Problem:   Schizoaffective disorder, bipolar type (HCC)  Total Time spent with patient: 20 minutes  Past Psychiatric History: schizoaffective disorder  Past Medical History:  Past Medical History:  Diagnosis Date  . Asthma   . Depression   . Psychosis [facility](HCC)     Past Surgical History:  Procedure Laterality Date  . BACK SURGERY     Family History: History reviewed. No pertinent family history. Family Psychiatric  History: none Social History:  Social History   Substance and Sexual Activity  Alcohol Use No     Social History   Substance and Sexual Activity  Drug Use Yes  . Types: Marijuana    Social History   Socioeconomic History  . Marital status: Single    Spouse name: Not on file  . Number of children: Not on file  . Years of education: Not on file  . Highest education level: Not on file  Occupational History  . Not on file  Social Needs  . Financial resource strain: Not on file  . Food insecurity:    Worry: Not on file    Inability: Not on file  . Transportation needs:    Medical: Not on file    Non-medical: Not on file  Tobacco Use  . Smoking status: Never Smoker   . Smokeless tobacco: Never Used  Substance and Sexual Activity  . Alcohol use: No  . Drug use: Yes    Types: Marijuana  . Sexual activity: Not Currently  Lifestyle  . Physical activity:    Days per week: Not on file    Minutes per session: Not on file  . Stress: Not on file  Relationships  . Social connections:    Talks on phone: Not on file    Gets together: Not on file    Attends religious service: Not on file    Active member of club or organization: Not on file    Attends meetings of clubs or organizations: Not on file    Relationship status: Not on file  Other Topics Concern  . Not on file  Social History Narrative  . Not on file   Additional Social History:   Sleep: Fair  Appetite:  Fair  Current Medications: Current Facility-Administered Medications  Medication Dose Route Frequency Provider Last Rate Last Dose  . acetaminophen (TYLENOL) tablet 650 mg  650 mg Oral Q6H PRN [name] [name], [name] [name], [name] [name]      . alum & mag hydroxide-simeth (MAALOX/MYLANTA) 200-200-20 MG/5ML suspension 30 mL  30 mL Oral Q4H PRN [name] [name], [name] [name], [name] [name]      . cloZAPine (CLOZARIL) tablet 300 mg  300 mg Oral QHS [name] [name], [name] [name], [name] [name]   300 mg at  08/06/18 2139  . diphenhydrAMINE (BENADRYL) capsule 50 mg  50 mg Oral QHS PRN [name] [name], [name] [name], [name] [name]      . divalproex (DEPAKOTE) DR tablet 500 mg  500 mg Oral Q12H [name] [name], [name] [name], [name] [name]   500 mg at 08/08/18 0729  . fluvoxaMINE (LUVOX) tablet 25 mg  25 mg Oral QHS [name] [name], [name] [name], [name] [name]   25 mg at 08/06/18 2139  . haloperidol (HALDOL) tablet 10 mg  10 mg Oral QHS [name] [name], [name] [name], [name] [name]   10 mg at 08/06/18 2139  . LORazepam (ATIVAN) tablet 1 mg  1 mg Oral Q4H PRN [name] [name], [name] [name], [name] [name]      . magnesium hydroxide (MILK OF MAGNESIA) suspension 30 mL  30 mL Oral Daily PRN [name] [name], [name] [name], [name] [name]      . metFORMIN (GLUCOPHAGE) tablet 500 mg  500 mg Oral Q breakfast [name] [name], [name] [name], [name] [name]   500 mg at 08/08/18 0729  . metoprolol tartrate (LOPRESSOR) tablet 12.5 mg  12.5 mg Oral BID  [name] [name], [name] [name], [name] [name]   12.5 mg at 08/08/18 0729  . traZODone (DESYREL) tablet 100 mg  100 mg Oral QHS PRN [name] [name], [name] [name], [name] [name]   100 mg at 08/06/18 2139    Lab Results:  Results for orders placed or performed during the hospital encounter of 08/03/18 (from the past 48 hour(s))  TSH     Status: None   Collection Time: 08/08/18  6:47 AM  Result Value Ref Range   TSH 1.378 0.350 - 4.500 uIU/mL    Comment: Performed by a 3rd Generation assay with a functional sensitivity of <=0.01 uIU/mL. Performed at [facility], [address] [place], [place] [address]     Blood Alcohol level:  Lab Results  Component Value Date   [facility] Inc[facility] <10 08/02/2018   ETH <10 05/19/2018    Metabolic Disorder Labs: Lab Results  Component Value Date   HGBA1C 4.9 03/08/2018   MPG 93.93 03/08/2018   MPG 85.32 12/30/2017   No results found for: PROLACTIN Lab Results  Component Value Date   CHOL 238 (H) 03/08/2018   TRIG 69 03/08/2018   HDL 56 03/08/2018   CHOLHDL 4.3 03/08/2018   VLDL 14 03/08/2018   LDLCALC 168 (H) 03/08/2018   LDLCALC 97 12/30/2017    Physical Findings: AIMS:  , ,  ,  ,    CIWA:    COWS:     Musculoskeletal: Strength & Muscle Tone: within normal limits Gait & Station: normal Patient leans: N/A  Psychiatric Specialty Exam: Physical Exam  Nursing note and vitals reviewed. Psychiatric: His speech is normal. His affect is labile. Thought content is paranoid and delusional. Cognition and memory are normal. [name] [name] expresses impulsivity.    Review of Systems  Neurological: Negative.   Psychiatric/Behavioral: Negative.   All other systems reviewed and are negative.   Blood pressure 128/87, pulse 88, temperature 98.7 F (37.1 C), temperature source Oral, resp. rate 17, height 6' (1.829 [name]), weight 94.3 kg, SpO2 100 %.Body mass index is 28.21 kg/[name].  General Appearance: Casual  Eye Contact:  Fair  Speech:  Clear and Coherent  Volume:  Normal  Mood:  Anxious and Dysphoric   Affect:  Labile  Thought Process:  Irrelevant  Orientation:  Full (Time, Place, and Person)  Thought Content:  Delusions, Hallucinations: Auditory and Paranoid Ideation  Suicidal Thoughts:  No  Homicidal Thoughts:  No  Memory:  Immediate;   Fair Recent;   Fair Remote;  Fair  Judgement:  Poor  Insight:  Lacking  Psychomotor Activity:  Normal  Concentration:  Concentration: Fair and Attention Span: Fair  Recall:  [employer] of Knowledge:  Fair  Language:  Fair  Akathisia:  No  Handed:  Right  AIMS (if indicated):     Assets:  Manufacturing systems engineer Physical Health Resilience Social Support  ADL's:  Intact  Cognition:  WNL  Sleep:  Number of Hours: 3.75     Treatment Plan Summary: Daily contact with patient to assess and evaluate symptoms and progress in treatment and Medication management   Mr. Kepford is a 23 year old male with a history of treatment resistant schizophrenia admitted for another psychotic break in the context of treatment noncompliance.Since admission, his meds were restarted and Mackinze Criado seems to tolerate well despite the polypharmacy, and his psychosis is improving.   #Catatonia, resolved  #Psychosis -continue Clozapine 300 mg nightly, clozapine level on admission: none. Pt seem to tolerate such rapid titration from 0 to 300mg  daily. Will continue monitor.  Clozapine level check planned for next week.  CBC with diff is WNL.  HR is WNL.  -Haldol 10 mg nightly -Luvox 25 mg nightly for augmentation of Clozapine.  -Depakote 500 mg BID -Trazodone 100mg  qhs.   - continue Ativan 1mg  q4h prn, which [name] [name] has not needed.   #Tachycardia -Metoprolol 12.5 mg BID  #Metabolic syndrome prevention -Metformin 500 mg BID  #Labs -lipid panel, TSH. A1C -EKG  #Disposition -discharge with the mother -Follow up with [facility]  [name] Mcinnis, [name] [name] 08/08/2018, 10:58 AM

## 2018-08-09 DIAGNOSIS — Z5181 Encounter for therapeutic drug level monitoring: Secondary | ICD-10-CM

## 2018-08-09 NOTE — [facility] Group Notes (Signed)
[facility] Group Notes:  (Nursing/MHT/Case Management/Adjunct)  Date:  08/09/2018  Time:  8:59 PM  Type of Therapy:  Group Therapy  Participation Level:  Did Not Attend  [name] [name] 08/09/2018, 8:59 PM 

## 2018-08-09 NOTE — Progress Notes (Signed)
D: Pt denies SI/HI/AVH, can contract for safety. Pt is pleasant and cooperative, but interactions are minimal. Pt. has no Complaints. Patient Interactions appropriate. No observations of responding to internal stimuli. Eye contact still poor, affect still flat. Pt. Reports normal mood.  A: Q x 15 minute observation checks were completed for safety. Patient was provided with education, but needs reinforcement. Patient was given/offered medications per orders. Patient  was encourage to attend groups, participate in unit activities and continue with plan of care. Pt. Chart and plans of care reviewed. Pt. Given support and encouragement.   R: Patient is complaint with medications with direction and encouragement. Pt. Eating good, attends all meals. Pt. Continues to be a majority of the time isolative and withdrawn to room. EKG hand-delivered to MD for review.              Precautionary checks every 15 minutes for safety maintained, room free of safety hazards, patient sustains no injury or falls during this shift. Will endorse care to next shift.

## 2018-08-09 NOTE — Progress Notes (Signed)
D - Patient was in his room upon arrival to the unit. Patient was pleasant during assessment and medication administration. Patient denies SI/HI/AVH and pain. Patient was compliant with medications with this writer this evening. Patient was isolative to his room other than coming out for snack.   A - Patient refused medications this evening. Patient was isolative to his room other than getting snack. Patient given education on medications. Patient given support and encouragement. Patient informed to let staff know if there are any issues on the unit.   R - Patient being monitored Q 15 minutes for safety per unit protocol. Patient remains safe on the unit at this time.

## 2018-08-09 NOTE — Plan of Care (Signed)
Pt. Complaint with medications and required testings. Pt. Able to remain safe while on the unit. Pt. Denies si/hi/avh, can contract for safety. Pt. Interactions with others are appropriate, but very minimal. Pt. Continues to be isolative and withdrawn to his room. No observations of responding to internal stimuli this shift.    Problem: Health Behavior/Discharge Planning: Goal: Compliance with treatment plan for underlying cause of condition will improve Outcome: Progressing   Problem: Education: Goal: Will be free of psychotic symptoms Outcome: Progressing   Problem: Health Behavior/Discharge Planning: Goal: Compliance with prescribed medication regimen will improve Outcome: Progressing   Problem: Safety: Goal: Ability to remain free from injury will improve Outcome: Progressing   Problem: Coping: Goal: Ability to interact with others will improve Outcome: Not Progressing

## 2018-08-09 NOTE — Plan of Care (Signed)
Isolative in room, not participating in group activities. Otherwise alert and oriented and denying hallucinations

## 2018-08-09 NOTE — Progress Notes (Signed)
[facility]  08/09/2018 12:59 PM [name] [name]  MRN:  [id number]  Subjective:   [name] is a 23 year old male with a history of treatment resistant schizoaffecctive disorder doing better on Haldol, Clozapine, depakote, admitted floridly psychotic in the context of treatment noncompliance for the past month. Restarted on medication, which Shani Fitch is compliant now.    Pt seen and chart reviewed. Haidy Kackley continues to report doing ok.  But feels 'bored" and went back to bed after breakfast. Delmos Velaquez said that Jayni Prescher is not tired, but just want to "pass the time".   RN: cooperative. Minimal agitation.   Principal Problem: Schizoaffective disorder, bipolar type (HCC) Diagnosis: Principal Problem:   Schizoaffective disorder, bipolar type (HCC)  Total Time spent with patient: 20 minutes  Past Psychiatric History: schizoaffective disorder  Past Medical History:  Past Medical History:  Diagnosis Date  . Asthma   . Depression   . Psychosis [facility](HCC)     Past Surgical History:  Procedure Laterality Date  . BACK SURGERY     Family History: History reviewed. No pertinent family history. Family Psychiatric  History: none Social History:  Social History   Substance and Sexual Activity  Alcohol Use No     Social History   Substance and Sexual Activity  Drug Use Yes  . Types: Marijuana    Social History   Socioeconomic History  . Marital status: Single    Spouse name: Not on file  . Number of children: Not on file  . Years of education: Not on file  . Highest education level: Not on file  Occupational History  . Not on file  Social Needs  . Financial resource strain: Not on file  . Food insecurity:    Worry: Not on file    Inability: Not on file  . Transportation needs:    Medical: Not on file    Non-medical: Not on file  Tobacco Use  . Smoking status: Never Smoker  . Smokeless tobacco: Never Used  Substance and Sexual Activity  . Alcohol use: No  . Drug use: Yes    Types:  Marijuana  . Sexual activity: Not Currently  Lifestyle  . Physical activity:    Days per week: Not on file    Minutes per session: Not on file  . Stress: Not on file  Relationships  . Social connections:    Talks on phone: Not on file    Gets together: Not on file    Attends religious service: Not on file    Active member of club or organization: Not on file    Attends meetings of clubs or organizations: Not on file    Relationship status: Not on file  Other Topics Concern  . Not on file  Social History Narrative  . Not on file   Additional Social History:   Sleep: Fair  Appetite:  Fair  Current Medications: Current Facility-Administered Medications  Medication Dose Route Frequency Provider Last Rate Last Dose  . acetaminophen (TYLENOL) tablet 650 mg  650 mg Oral Q6H PRN Mariel CraftMaurer, Sheila M, MD      . alum & mag hydroxide-simeth (MAALOX/MYLANTA) 200-200-20 MG/5ML suspension 30 mL  30 mL Oral Q4H PRN Mariel CraftMaurer, Sheila M, MD      . cloZAPine (CLOZARIL) tablet 300 mg  300 mg Oral QHS Mariel CraftMaurer, Sheila M, MD   300 mg at 08/08/18 2108  . diphenhydrAMINE (BENADRYL) capsule 50 mg  50 mg Oral QHS PRN Mariel CraftMaurer, Sheila M, MD      .  divalproex (DEPAKOTE) DR tablet 500 mg  500 mg Oral Q12H Mariel Craft, MD   500 mg at 08/09/18 1962  . fluvoxaMINE (LUVOX) tablet 25 mg  25 mg Oral QHS Mariel Craft, MD   25 mg at 08/08/18 2108  . haloperidol (HALDOL) tablet 10 mg  10 mg Oral QHS Mariel Craft, MD   10 mg at 08/08/18 2108  . LORazepam (ATIVAN) tablet 1 mg  1 mg Oral Q4H PRN Mariel Craft, MD      . magnesium hydroxide (MILK OF MAGNESIA) suspension 30 mL  30 mL Oral Daily PRN Mariel Craft, MD      . metFORMIN (GLUCOPHAGE) tablet 500 mg  500 mg Oral Q breakfast Mariel Craft, MD   500 mg at 08/09/18 2297  . metoprolol tartrate (LOPRESSOR) tablet 12.5 mg  12.5 mg Oral BID Mariel Craft, MD   12.5 mg at 08/09/18 9892  . traZODone (DESYREL) tablet 100 mg  100 mg Oral QHS PRN Mariel Craft, MD   100 mg at 08/06/18 2139    Lab Results:  Results for orders placed or performed during the hospital encounter of 08/03/18 (from the past 48 hour(s))  TSH     Status: None   Collection Time: 08/08/18  6:47 AM  Result Value Ref Range   TSH 1.378 0.350 - 4.500 uIU/mL    Comment: Performed by a 3rd Generation assay with a functional sensitivity of <=0.01 uIU/mL. Performed at [facility], [address] [place], [place] [address]     Blood Alcohol level:  Lab Results  Component Value Date   [facility] <10 08/02/2018   ETH <10 05/19/2018    Metabolic Disorder Labs: Lab Results  Component Value Date   HGBA1C 4.9 03/08/2018   MPG 93.93 03/08/2018   MPG 85.32 12/30/2017   No results found for: PROLACTIN Lab Results  Component Value Date   CHOL 238 (H) 03/08/2018   TRIG 69 03/08/2018   HDL 56 03/08/2018   CHOLHDL 4.3 03/08/2018   VLDL 14 03/08/2018   LDLCALC 168 (H) 03/08/2018   LDLCALC 97 12/30/2017    Physical Findings: AIMS:  , ,  ,  ,    CIWA:    COWS:     Musculoskeletal: Strength & Muscle Tone: within normal limits Gait & Station: normal Patient leans: N/A  Psychiatric Specialty Exam: Physical Exam  Nursing note and vitals reviewed. Psychiatric: His speech is normal. His affect is labile. Thought content is paranoid and delusional. Cognition and memory are normal. Everlena Mackley expresses impulsivity.    Review of Systems  Neurological: Negative.   Psychiatric/Behavioral: Negative.   All other systems reviewed and are negative.   Blood pressure 130/87, pulse 96, temperature 97.7 F (36.5 C), temperature source Oral, resp. rate 16, height 6' (1.829 m), weight 94.3 kg, SpO2 100 %.Body mass index is 28.21 kg/m.  General Appearance: Casual  Eye Contact:  Minimal  Speech:  Clear and Coherent  Volume:  Normal  Mood:  Anxious and Dysphoric  Affect:  Labile  Thought Process:  Coherent  Orientation:  Full (Time, Place, and Person)  Thought Content:   Delusions, Hallucinations: Auditory and Paranoid Ideation  Suicidal Thoughts:  No  Homicidal Thoughts:  No  Memory:  Immediate;   Fair Recent;   Fair Remote;   Fair  Judgement:  Poor  Insight:  Lacking  Psychomotor Activity:  Normal  Concentration:  Concentration: Fair and Attention Span: Fair  Recall:  Fair  [employer] EnergyFund of Knowledge:  Fair  Language:  Fair  Akathisia:  No  Handed:  Right  AIMS (if indicated):     Assets:  Manufacturing systems engineerCommunication Skills Physical Health Resilience Social Support  ADL's:  Intact  Cognition:  WNL  Sleep:  Number of Hours: 7.75     Treatment Plan Summary: Daily contact with patient to assess and evaluate symptoms and progress in treatment and Medication management   [name] is a 23 year old male with a history of treatment resistant schizophrenia admitted for another psychotic break in the context of treatment noncompliance.Since admission, his meds were restarted and Dracen Reigle seems to tolerate well despite the polypharmacy, and his psychosis is improving.   #Catatonia, resolved  #Psychosis -continue Clozapine 300 mg nightly, No clozapine level on admission.  Pt seem to tolerate the rapid titration of clozapine, which was restarted at 300mg  daily. Will continue monitor.  Clozapine level check planned for next week.  CBC with diff is WNL.  HR was WNL yesterday, but a bit tachy today.  - Ordered ECK for monitoring.  Last one was done on 05/20/2018 -Haldol 10 mg nightly -Luvox 25 mg nightly for augmentation of Clozapine.  -Depakote 500 mg BID -Trazodone 100mg  qhs.   - continue Ativan 1mg  q4h prn, which Dreanna Kyllo has not needed.   #Tachycardia -Metoprolol 12.5 mg BID  #Metabolic syndrome prevention -Metformin 500 mg BID  #Labs -lipid panel, TSH. A1C -EKG  #Disposition -discharge with the mother -[facility]  Derin Granquist, MD 08/09/2018, 12:59 PM

## 2018-08-09 NOTE — Plan of Care (Signed)
Patient was isolative to his room this evening but did come out for snack. Patient was brief with his eye contact with this Clinical research associate.   Problem: Coping: Goal: Ability to interact with others will improve Outcome: Not Progressing Goal: Ability to use eye contact when communicating with others will improve Outcome: Not Progressing

## 2018-08-09 NOTE — [facility] Group Notes (Signed)
LCSW Group Therapy Note 08/09/2018 1:15pm  Type of Therapy and Topic: Group Therapy: Feelings Around Returning Home & Establishing a Supportive Framework and Supporting Oneself When Supports Not Available  Participation Level: Did Not Attend  Description of Group:  Patients first processed thoughts and feelings about upcoming discharge. These included fears of upcoming changes, lack of change, new living environments, judgements and expectations from others and overall stigma of mental health issues. The group then discussed the definition of a supportive framework, what that looks and feels like, and how do to discern it from an unhealthy non-supportive network. The group identified different types of supports as well as what to do when your family/friends are less than helpful or unavailable  Therapeutic Goals  1. Patient will identify one healthy supportive network that they can use at discharge. 2. Patient will identify one factor of a supportive framework and how to tell it from an unhealthy network. 3. Patient able to identify one coping skill to use when they do not have positive supports from others. 4. Patient will demonstrate ability to communicate their needs through discussion and/or role plays.  Summary of Patient Progress:  Pt was invited to attend group but chose not to attend. CSW will continue to encourage pt to attend group throughout their admission.   Therapeutic Modalities Cognitive Behavioral Therapy Motivational Interviewing   [name] Hairfield  CUEBAS-COLON, LCSW 08/09/2018 9:16 AM  

## 2018-08-10 LAB — LIPID PANEL
Cholesterol: 175 mg/dL (ref 0–200)
HDL: 46 mg/dL (ref >40)
LDL Cholesterol: 108 mg/dL — ABNORMAL HIGH (ref 0–99)
Total CHOL/HDL Ratio: 3.8 RATIO
Triglycerides: 103 mg/dL (ref <150)
VLDL: 21 mg/dL (ref 0–40)

## 2018-08-10 LAB — CBC WITH DIFFERENTIAL/PLATELET
Abs Immature Granulocytes: 0.01 10*3/uL (ref 0.00–0.07)
Basophils Absolute: 0 10*3/uL (ref 0.0–0.1)
Basophils Relative: 0 %
Eosinophils Absolute: 0 10*3/uL (ref 0.0–0.5)
Eosinophils Relative: 0 %
HCT: 46.8 % (ref 39.0–52.0)
Hemoglobin: 16.1 g/dL (ref 13.0–17.0)
Immature Granulocytes: 0 %
Lymphocytes Relative: 36 %
Lymphs Abs: 1.8 10*3/uL (ref 0.7–4.0)
MCH: 28.2 pg (ref 26.0–34.0)
MCHC: 34.4 g/dL (ref 30.0–36.0)
MCV: 82 fL (ref 80.0–100.0)
MONOS PCT: 8 %
Monocytes Absolute: 0.4 10*3/uL (ref 0.1–1.0)
Neutro Abs: 2.7 10*3/uL (ref 1.7–7.7)
Neutrophils Relative %: 56 %
Platelets: 253 10*3/uL (ref 150–400)
RBC: 5.71 MIL/uL (ref 4.22–5.81)
RDW: 12.4 % (ref 11.5–15.5)
WBC: 4.8 10*3/uL (ref 4.0–10.5)
nRBC: 0 % (ref 0.0–0.2)

## 2018-08-10 LAB — HEMOGLOBIN A1C
Hgb A1c MFr Bld: 5.1 % (ref 4.8–5.6)
Mean Plasma Glucose: 99.67 mg/dL

## 2018-08-10 LAB — VALPROIC ACID LEVEL: Valproic Acid Lvl: 114 ug/mL — ABNORMAL HIGH (ref 50.0–100.0)

## 2018-08-10 MED ORDER — METOPROLOL TARTRATE 25 MG PO TABS
25.0000 mg | ORAL_TABLET | Freq: Two times a day (BID) | ORAL | Status: DC
  Administered 2018-08-10 – 2018-08-12 (×4): 25 mg via ORAL
  Filled 2018-08-10 (×4): qty 1

## 2018-08-10 NOTE — Plan of Care (Signed)
Pt. Interactions with peers and staff is appropriate. Pt. Not observed responding to internal stimuli. Pt. Complaint with medications. Pt. Communication is improved. Pt. Denies si/hi/avh, can contract for safety. Pt. Continues to be isolative and withdrawn to room, not attending groups. Pt. Does attend meals though.    Problem: Health Behavior/Discharge Planning: Goal: Compliance with treatment plan for underlying cause of condition will improve 08/10/2018 1323 by Lenox Ponds, RN Outcome: Not Progressing 08/10/2018 1315 by Lenox Ponds, RN Outcome: Progressing   Problem: Coping: Goal: Ability to interact with others will improve 08/10/2018 1323 by Lenox Ponds, RN Outcome: Progressing 08/10/2018 1315 by Lenox Ponds, RN Outcome: Progressing   Problem: Education: Goal: Will be free of psychotic symptoms 08/10/2018 1323 by Lenox Ponds, RN Outcome: Progressing 08/10/2018 1315 by Lenox Ponds, RN Outcome: Progressing   Problem: Health Behavior/Discharge Planning: Goal: Compliance with prescribed medication regimen will improve 08/10/2018 1323 by Lenox Ponds, RN Outcome: Progressing 08/10/2018 1315 by Lenox Ponds, RN Outcome: Progressing   Problem: Role Relationship: Goal: Ability to communicate needs accurately will improve Outcome: Progressing Goal: Ability to interact with others will improve 08/10/2018 1323 by Lenox Ponds, RN Outcome: Progressing 08/10/2018 1315 by Lenox Ponds, RN Outcome: Not Progressing   Problem: Safety: Goal: Ability to remain free from injury will improve Outcome: Progressing

## 2018-08-10 NOTE — Progress Notes (Signed)
Patient was isolative in room  Refusing to participate in group activities.  However, patient came out later. Presented to the nurses station and asked for a cereal. Sat alone in the visitation room and ate his snack. Returned to his room and fell asleep later. Did not have any hallucinations nor suicidal thoughts. Was encouraged to talk to staff as needed. Was knowledgeable of his scheduled medications. Did not have any concern. Currently sleeping and safety maintained.

## 2018-08-10 NOTE — Progress Notes (Signed)
Clozapine monitoring  ANC 2.7. Lab entered and patient is eligible to receive with every 4 week monitoring per [employer] website. Will continue with weekly monitoring per hospital protocol.  Clovia Cuff, PharmD, BCPS 08/10/2018 8:17 AM

## 2018-08-10 NOTE — [facility] Group Notes (Signed)
Overcoming Obstacles  08/10/2018 1PM  Type of Therapy and Topic:  Group Therapy:  Overcoming Obstacles  Participation Level:  Did Not Attend    Description of Group:    In this group patients will be encouraged to explore what they see as obstacles to their own wellness and recovery. They will be guided to discuss their thoughts, feelings, and behaviors related to these obstacles. The group will process together ways to cope with barriers, with attention given to specific choices patients can make. Each patient will be challenged to identify changes they are motivated to make in order to overcome their obstacles. This group will be process-oriented, with patients participating in exploration of their own experiences as well as giving and receiving support and challenge from other group members.   Therapeutic Goals: 1. Patient will identify personal and current obstacles as they relate to admission. 2. Patient will identify barriers that currently interfere with their wellness or overcoming obstacles.  3. Patient will identify feelings, thought process and behaviors related to these barriers. 4. Patient will identify two changes they are willing to make to overcome these obstacles:      Summary of Patient Progress     Therapeutic Modalities:   Cognitive Behavioral Therapy Solution Focused Therapy Motivational Interviewing Relapse Prevention Therapy    Lowella Dandy, MSW, LCSW 08/10/2018 2:13 PM

## 2018-08-10 NOTE — Tx Team (Signed)
Interdisciplinary Treatment and Diagnostic Plan Update  08/10/2018 Time of Session: 830am [name] [name] MRN: [id number]  Principal Diagnosis: Schizoaffective disorder, bipolar type [facility](HCC)  Secondary Diagnoses: Principal Problem:   Schizoaffective disorder, bipolar type (HCC)   Current Medications:  Current Facility-Administered Medications  Medication Dose Route Frequency Provider Last Rate Last Dose  . acetaminophen (TYLENOL) tablet 650 mg  650 mg Oral Q6H PRN Mariel CraftMaurer, Sheila M, MD      . alum & mag hydroxide-simeth (MAALOX/MYLANTA) 200-200-20 MG/5ML suspension 30 mL  30 mL Oral Q4H PRN Mariel CraftMaurer, Sheila M, MD      . cloZAPine (CLOZARIL) tablet 300 mg  300 mg Oral QHS Mariel CraftMaurer, Sheila M, MD   300 mg at 08/09/18 2200  . diphenhydrAMINE (BENADRYL) capsule 50 mg  50 mg Oral QHS PRN Mariel CraftMaurer, Sheila M, MD      . divalproex (DEPAKOTE) DR tablet 500 mg  500 mg Oral Q12H Mariel CraftMaurer, Sheila M, MD   500 mg at 08/10/18 0743  . fluvoxaMINE (LUVOX) tablet 25 mg  25 mg Oral QHS Mariel CraftMaurer, Sheila M, MD   25 mg at 08/09/18 2200  . haloperidol (HALDOL) tablet 10 mg  10 mg Oral QHS Mariel CraftMaurer, Sheila M, MD   10 mg at 08/09/18 2201  . magnesium hydroxide (MILK OF MAGNESIA) suspension 30 mL  30 mL Oral Daily PRN Mariel CraftMaurer, Sheila M, MD      . metFORMIN (GLUCOPHAGE) tablet 500 mg  500 mg Oral Q breakfast Mariel CraftMaurer, Sheila M, MD   500 mg at 08/10/18 0743  . metoprolol tartrate (LOPRESSOR) tablet 25 mg  25 mg Oral BID [name], [name], MD      . traZODone (DESYREL) tablet 100 mg  100 mg Oral QHS PRN Mariel CraftMaurer, Sheila M, MD   100 mg at 08/06/18 2139   PTA Medications: Medications Prior to Admission  Medication Sig Dispense Refill Last Dose  . cloZAPine (CLOZARIL) 100 MG tablet Take 3 tablets (300 mg total) by mouth at bedtime. (Patient not taking: Reported on 08/03/2018) 90 tablet 1` Not Taking at Unknown time  . divalproex (DEPAKOTE) 500 MG DR tablet Take 1 tablet (500 mg total) by mouth every 12 (twelve) hours. (Patient not  taking: Reported on 08/03/2018) 60 tablet 1 Not Taking at Unknown time  . fluvoxaMINE (LUVOX) 25 MG tablet Take 1 tablet (25 mg total) by mouth at bedtime. (Patient not taking: Reported on 08/03/2018) 30 tablet 1 Not Taking at Unknown time  . haloperidol (HALDOL) 10 MG tablet Take 1 tablet (10 mg total) by mouth at bedtime. (Patient not taking: Reported on 08/03/2018) 30 tablet 1 Not Taking at Unknown time  . metFORMIN (GLUCOPHAGE) 500 MG tablet Take 1 tablet (500 mg total) by mouth daily with breakfast. (Patient not taking: Reported on 08/03/2018) 30 tablet 1 Not Taking at Unknown time  . metoprolol tartrate (LOPRESSOR) 25 MG tablet Take 0.5 tablets (12.5 mg total) by mouth 2 (two) times daily. (Patient not taking: Reported on 08/03/2018) 60 tablet 1 Not Taking at Unknown time  . traZODone (DESYREL) 100 MG tablet Take 1 tablet (100 mg total) by mouth at bedtime as needed for sleep. (Patient not taking: Reported on 08/03/2018) 30 tablet 1 Not Taking at Unknown time    Patient Stressors: Medication change or noncompliance  Patient Strengths: Ability for insight  Treatment Modalities: Medication Management, Group therapy, Case management,  1 to 1 session with clinician, Psychoeducation, Recreational therapy.   Physician Treatment Plan for Primary Diagnosis: Schizoaffective disorder, bipolar type (HCC)  Long Term Goal(s): Improvement in symptoms so as ready for discharge Improvement in symptoms so as ready for discharge   Short Term Goals: Ability to identify changes in lifestyle to reduce recurrence of condition will improve Ability to verbalize feelings will improve Ability to disclose and discuss suicidal ideas Ability to demonstrate self-control will improve Ability to identify and develop effective coping behaviors will improve Ability to maintain clinical measurements within normal limits will improve Compliance with prescribed medications will improve Ability to identify triggers associated  with substance abuse/mental health issues will improve NA  Medication Management: Evaluate patient's response, side effects, and tolerance of medication regimen.  Therapeutic Interventions: 1 to 1 sessions, Unit Group sessions and Medication administration.  Evaluation of Outcomes: Progressing  Physician Treatment Plan for Secondary Diagnosis: Principal Problem:   Schizoaffective disorder, bipolar type (HCC)  Long Term Goal(s): Improvement in symptoms so as ready for discharge Improvement in symptoms so as ready for discharge   Short Term Goals: Ability to identify changes in lifestyle to reduce recurrence of condition will improve Ability to verbalize feelings will improve Ability to disclose and discuss suicidal ideas Ability to demonstrate self-control will improve Ability to identify and develop effective coping behaviors will improve Ability to maintain clinical measurements within normal limits will improve Compliance with prescribed medications will improve Ability to identify triggers associated with substance abuse/mental health issues will improve NA     Medication Management: Evaluate patient's response, side effects, and tolerance of medication regimen.  Therapeutic Interventions: 1 to 1 sessions, Unit Group sessions and Medication administration.  Evaluation of Outcomes: Progressing   RN Treatment Plan for Primary Diagnosis: Schizoaffective disorder, bipolar type (HCC) Long Term Goal(s): Knowledge of disease and therapeutic regimen to maintain health will improve  Short Term Goals: Ability to participate in decision making will improve, Ability to verbalize feelings will improve, Ability to identify and develop effective coping behaviors will improve and Compliance with prescribed medications will improve  Medication Management: RN will administer medications as ordered by provider, will assess and evaluate patient's response and provide education to patient for  prescribed medication. RN will report any adverse and/or side effects to prescribing provider.  Therapeutic Interventions: 1 on 1 counseling sessions, Psychoeducation, Medication administration, Evaluate responses to treatment, Monitor vital signs and CBGs as ordered, Perform/monitor CIWA, COWS, AIMS and Fall Risk screenings as ordered, Perform wound care treatments as ordered.  Evaluation of Outcomes: Progressing   LCSW Treatment Plan for Primary Diagnosis: Schizoaffective disorder, bipolar type (HCC) Long Term Goal(s): Safe transition to appropriate next level of care at discharge, Engage patient in therapeutic group addressing interpersonal concerns.  Short Term Goals: Engage patient in aftercare planning with referrals and resources  Therapeutic Interventions: Assess for all discharge needs, 1 to 1 time with Social worker, Explore available resources and support systems, Assess for adequacy in community support network, Educate family and significant other(s) on suicide prevention, Complete Psychosocial Assessment, Interpersonal group therapy.  Evaluation of Outcomes: Progressing   Progress in Treatment: Attending groups: No. Participating in groups: No. Taking medication as prescribed: Yes.  Toleration medication: Yes. Family/Significant other contact made: No, will contact:  Pt declined Patient understands diagnosis: Yes. Discussing patient identified problems/goals with staff: Yes. Medical problems stabilized or resolved: Yes. Denies suicidal/homicidal ideation: Yes. Issues/concerns per patient self-inventory: No. Other: NA  New problem(s) identified: No, Describe:  None reported  New Short Term/Long Term Goal(s): "Leave as soon as possible"  Patient Goals:  "Leave as soon as possible" Pt says he  plans to move to [place] to attend [employer].  Discharge Plan or Barriers: Pt will return home and follow up with [facility]  Reason for Continuation of  Hospitalization: Medication stabilization  Estimated Length of Stay: 5-7 days  Recreational Therapy: Patient Stressors: N/A  Patient Goal: Patient will engage in interactions with peers and staff in pro-social manner at least 2x within 5 recreation therapy group sessions  Attendees: Patient:  08/10/2018 11:34 AM  Physician: Kristine Linea MD 08/10/2018 11:34 AM  Nursing:  08/10/2018 11:34 AM  RN Care Manager: 08/10/2018 11:34 AM  Social Worker: Lowella Dandy LCSW Penni Homans LCSW 08/10/2018 11:34 AM  Recreational Therapist:  08/10/2018 11:34 AM  Other:  08/10/2018 11:34 AM  Other:  08/10/2018 11:34 AM  Other: 08/10/2018 11:34 AM    Scribe for Treatment Team: Suzan Slick, LCSW 08/10/2018 11:34 AM

## 2018-08-10 NOTE — Progress Notes (Signed)
Recreation Therapy Notes  Date: 08/10/2018  Time: 9:30 am   Location: Craft room   Behavioral response: N/A   Intervention Topic: Self-care  Discussion/Intervention: Patient did not attend group.   Clinical Observations/Feedback:  Patient did not attend group.   Alaney Witter LRT/CTRS         Tessa Seaberry 08/10/2018 10:19 AM

## 2018-08-10 NOTE — Progress Notes (Signed)
[facility] MD Progress Note  08/10/2018 8:53 AM GEOVONNI MEYERHOFF  MRN:  [id number]  Subjective:   Mr. Rosendahl had uneventful weekend. Compliant with medications as indicated by VPA level 114, awaiting Clozapine level still. ANC 2.7. He is withdrawn to his room most of the time and does not participate in activities. Minimal interaction with staff or peers. Has no complaints but feels bored and want to go home soon. Blood pressure and heart rate remain elevated.  I have been unable to contact his mother bur met with ACT team worker who will assist in discharge planning.  Principal Problem: Schizoaffective disorder, bipolar type ([place]) Diagnosis: Principal Problem:   Schizoaffective disorder, bipolar type ([place])  Total Time spent with patient: 20 minutes  Past Psychiatric History: schizophrenia  Past Medical History:  Past Medical History:  Diagnosis Date  . Asthma   . Depression   . Psychosis [facility])     Past Surgical History:  Procedure Laterality Date  . BACK SURGERY     Family History: History reviewed. No pertinent family history. Family Psychiatric  History: none Social History:  Social History   Substance and Sexual Activity  Alcohol Use No     Social History   Substance and Sexual Activity  Drug Use Yes  . Types: Marijuana    Social History   Socioeconomic History  . Marital status: Single    Spouse name: Not on file  . Number of children: Not on file  . Years of education: Not on file  . Highest education level: Not on file  Occupational History  . Not on file  Social Needs  . Financial resource strain: Not on file  . Food insecurity:    Worry: Not on file    Inability: Not on file  . Transportation needs:    Medical: Not on file    Non-medical: Not on file  Tobacco Use  . Smoking status: Never Smoker  . Smokeless tobacco: Never Used  Substance and Sexual Activity  . Alcohol use: No  . Drug use: Yes    Types: Marijuana  . Sexual activity: Not Currently   Lifestyle  . Physical activity:    Days per week: Not on file    Minutes per session: Not on file  . Stress: Not on file  Relationships  . Social connections:    Talks on phone: Not on file    Gets together: Not on file    Attends religious service: Not on file    Active member of club or organization: Not on file    Attends meetings of clubs or organizations: Not on file    Relationship status: Not on file  Other Topics Concern  . Not on file  Social History Narrative  . Not on file   Additional Social History:                         Sleep: Fair  Appetite:  Fair  Current Medications: Current Facility-Administered Medications  Medication Dose Route Frequency Provider Last Rate Last Dose  . acetaminophen (TYLENOL) tablet 650 mg  650 mg Oral Q6H PRN Lavella Hammock, MD      . alum & mag hydroxide-simeth (MAALOX/MYLANTA) 200-200-20 MG/5ML suspension 30 mL  30 mL Oral Q4H PRN Lavella Hammock, MD      . cloZAPine (CLOZARIL) tablet 300 mg  300 mg Oral QHS Lavella Hammock, MD   300 mg at 08/09/18 2200  .  diphenhydrAMINE (BENADRYL) capsule 50 mg  50 mg Oral QHS PRN Lavella Hammock, MD      . divalproex (DEPAKOTE) DR tablet 500 mg  500 mg Oral Q12H Lavella Hammock, MD   500 mg at 08/10/18 0743  . fluvoxaMINE (LUVOX) tablet 25 mg  25 mg Oral QHS Lavella Hammock, MD   25 mg at 08/09/18 2200  . haloperidol (HALDOL) tablet 10 mg  10 mg Oral QHS Lavella Hammock, MD   10 mg at 08/09/18 2201  . LORazepam (ATIVAN) tablet 1 mg  1 mg Oral Q4H PRN Lavella Hammock, MD      . magnesium hydroxide (MILK OF MAGNESIA) suspension 30 mL  30 mL Oral Daily PRN Lavella Hammock, MD      . metFORMIN (GLUCOPHAGE) tablet 500 mg  500 mg Oral Q breakfast Lavella Hammock, MD   500 mg at 08/10/18 0743  . metoprolol tartrate (LOPRESSOR) tablet 12.5 mg  12.5 mg Oral BID Lavella Hammock, MD   12.5 mg at 08/10/18 0743  . traZODone (DESYREL) tablet 100 mg  100 mg Oral QHS PRN Lavella Hammock, MD    100 mg at 08/06/18 2139    Lab Results:  Results for orders placed or performed during the hospital encounter of 08/03/18 (from the past 48 hour(s))  Lipid panel     Status: Abnormal   Collection Time: 08/10/18  7:00 AM  Result Value Ref Range   Cholesterol 175 0 - 200 mg/dL   Triglycerides 103 <150 mg/dL   HDL 46 >40 mg/dL   Total CHOL/HDL Ratio 3.8 RATIO   VLDL 21 0 - 40 mg/dL   LDL Cholesterol 108 (H) 0 - 99 mg/dL    Comment:        Total Cholesterol/HDL:CHD Risk Coronary Heart Disease Risk Table                     Men   Women  1/2 Average Risk   3.4   3.3  Average Risk       5.0   4.4  2 X Average Risk   9.6   7.1  3 X Average Risk  23.4   11.0        Use the calculated Patient Ratio above and the CHD Risk Table to determine the patient's CHD Risk.        ATP III CLASSIFICATION (LDL):  <100     mg/dL   Optimal  100-129  mg/dL   Near or Above                    Optimal  130-159  mg/dL   Borderline  160-189  mg/dL   High  >190     mg/dL   Very High Performed at [facility], [address] [place], [place] [address]   Valproic acid level     Status: Abnormal   Collection Time: 08/10/18  7:00 AM  Result Value Ref Range   Valproic Acid Lvl 114 (H) 50.0 - 100.0 ug/mL    Comment: Performed at [facility], [address] [place], [place] [address]  CBC with Differential/Platelet     Status: None   Collection Time: 08/10/18  7:00 AM  Result Value Ref Range   WBC 4.8 4.0 - 10.5 K/uL   RBC 5.71 4.22 - 5.81 MIL/uL   Hemoglobin 16.1 13.0 - 17.0 g/dL   HCT 46.8 39.0 - 52.0 %  MCV 82.0 80.0 - 100.0 fL   MCH 28.2 26.0 - 34.0 pg   MCHC 34.4 30.0 - 36.0 g/dL   RDW 12.4 11.5 - 15.5 %   Platelets 253 150 - 400 K/uL   nRBC 0.0 0.0 - 0.2 %   Neutrophils Relative % 56 %   Neutro Abs 2.7 1.7 - 7.7 K/uL   Lymphocytes Relative 36 %   Lymphs Abs 1.8 0.7 - 4.0 K/uL   Monocytes Relative 8 %   Monocytes Absolute 0.4 0.1 - 1.0 K/uL   Eosinophils Relative 0  %   Eosinophils Absolute 0.0 0.0 - 0.5 K/uL   Basophils Relative 0 %   Basophils Absolute 0.0 0.0 - 0.1 K/uL   Immature Granulocytes 0 %   Abs Immature Granulocytes 0.01 0.00 - 0.07 K/uL    Comment: Performed at [facility], [address] [place], [place] [address]    Blood Alcohol level:  Lab Results  Component Value Date   [facility] <10 08/02/2018   ETH <10 42/68/3419    Metabolic Disorder Labs: Lab Results  Component Value Date   HGBA1C 4.9 03/08/2018   MPG 93.93 03/08/2018   MPG 85.32 12/30/2017   No results found for: PROLACTIN Lab Results  Component Value Date   CHOL 175 08/10/2018   TRIG 103 08/10/2018   HDL 46 08/10/2018   CHOLHDL 3.8 08/10/2018   VLDL 21 08/10/2018   LDLCALC 108 (H) 08/10/2018   LDLCALC 168 (H) 03/08/2018    Physical Findings: AIMS:  , ,  ,  ,    CIWA:    COWS:     Musculoskeletal: Strength & Muscle Tone: within normal limits Gait & Station: normal Patient leans: N/A  Psychiatric Specialty Exam: Physical Exam  Nursing note and vitals reviewed. Psychiatric: His affect is blunt. His speech is delayed. He is withdrawn. Thought content is paranoid and delusional. Cognition and memory are impaired. He expresses impulsivity.    Review of Systems  Neurological: Negative.   Psychiatric/Behavioral: Negative.   All other systems reviewed and are negative.   Blood pressure (!) 144/92, pulse (!) 105, temperature (!) 97.5 F (36.4 C), temperature source Oral, resp. rate 16, height 6' (1.829 m), weight 94.3 kg, SpO2 100 %.Body mass index is 28.21 kg/m.  General Appearance: Casual  Eye Contact:  Fair  Speech:  Clear and Coherent and Slow  Volume:  Decreased  Mood:  Euthymic  Affect:  Flat  Thought Process:  Goal Directed and Descriptions of Associations: Tangential  Orientation:  Full (Time, Place, and Person)  Thought Content:  Delusions and Paranoid Ideation  Suicidal Thoughts:  No  Homicidal Thoughts:  No  Memory:  Immediate;    Fair Recent;   Fair Remote;   Fair  Judgement:  Poor  Insight:  Lacking  Psychomotor Activity:  Decreased  Concentration:  Concentration: Fair and Attention Span: Fair  Recall:  [employer] of Knowledge:  Fair  Language:  Fair  Akathisia:  No  Handed:  Right  AIMS (if indicated):     Assets:  Communication Skills Desire for Improvement Financial Resources/Insurance Housing Physical Health Resilience Social Support  ADL's:  Intact  Cognition:  WNL  Sleep:  Number of Hours: 7.45     Treatment Plan Summary: Daily contact with patient to assess and evaluate symptoms and progress in treatment and Medication management   Mr. Mancinas is a 23 year old male with a history of treatment resistant schizophrenia admitted for another psychotic break in the  context of treatment noncompliance.Since admission, his meds were restarted and he seems to tolerate well despite the polypharmacy, and his psychosis is improving.   #Psychosis, improving -continue Clozapine 300 mg nightly, awaiting Clo level   -Haldol 10 mg nightly -Luvox 25 mg nightly for augmentation of Clozapine.  -Depakote 500 mg BID, VPA level 114 -Trazodone 110m qhs.   -discontinue Ativan  #Tachycardia -increase Metoprolol 25 mg BID  #Metabolic syndrome prevention -Metformin 500 mg BID  #Labs -lipid panel, TSH. A1C -EKG reviewed, sinus tachycardia with QTc 427  #Disposition -discharge with the mother -[facility] J[facility]son Slick MD 08/10/2018, 8:53 AM

## 2018-08-10 NOTE — Progress Notes (Signed)
D: Pt denies SI/HI/AVH, can contract for safety. Pt is pleasant and cooperative when interacting with staff and peers, but interactions minimal. No observations of responding to internal stimuli. Pt. Reports a normal mood, but affect still mostly flat. Pt. Eye contact poor.   A: Q x 15 minute observation checks were completed for safety. Patient was provided with education, but needs reinforcement. Patient was given/offered medications per orders. Patient  was encourage to attend groups, participate in unit activities and continue with plan of care. Pt. Chart and plans of care reviewed. Pt. Given support and encouragement.   R: Patient is complaint with medications with direction and encouragement. Pt. Attends all meals, eating good. Pt. Does not attend groups or unit activities. Pt. Isolative and withdrawn a majority of the day.            Precautionary checks every 15 minutes for safety maintained, room free of safety hazards, patient sustains no injury or falls during this shift. Will endorse care to next shift.

## 2018-08-11 LAB — CLOZAPINE (CLOZARIL)
CLOZAPINE LVL: 474 ng/mL (ref 350–650)
NorClozapine: 92 ng/mL
Total(Cloz+Norcloz): 566 ng/mL

## 2018-08-11 NOTE — Plan of Care (Signed)
Patient is alert and oriented X 3. Patient denies SI, HI and AVH. Patient denies pain 0/10. Patient is isolative but comes out to eat. Patient is able to make eye contact. Forwards very little in conversation, minimal interaction with staff. No self injurious behaviors. Problem: Activity: Goal: Will identify at least one activity in which they can participate Outcome: Progressing   Problem: Coping: Goal: Ability to identify and develop effective coping behavior will improve Outcome: Progressing Goal: Ability to interact with others will improve Outcome: Progressing Goal: Demonstration of participation in decision-making regarding own care will improve Outcome: Progressing Goal: Ability to use eye contact when communicating with others will improve Outcome: Progressing   Problem: Self-Concept: Goal: Will verbalize positive feelings about self Outcome: Progressing   Problem: Education: Goal: Knowledge of [place] [facility] Education information/materials will improve Outcome: Progressing Goal: Emotional status will improve Outcome: Progressing Goal: Mental status will improve Outcome: Progressing

## 2018-08-11 NOTE — Progress Notes (Signed)
Recreation Therapy Notes   Date: 08/11/2018  Time: 9:30 am   Location: Craft room   Behavioral response: N/A   Intervention Topic: Communication   Discussion/Intervention: Patient did not attend group.   Clinical Observations/Feedback:  Patient did not attend group.   Jerrald Doverspike LRT/CTRS        [name] [name] 08/11/2018 10:36 AM

## 2018-08-11 NOTE — [facility] Group Notes (Signed)
LCSW Group Therapy Note  08/11/2018 1:00 PM  Type of Therapy/Topic:  Group Therapy:  Feelings about Diagnosis  Participation Level:  Did Not Attend   Description of Group:   This group will allow patients to explore their thoughts and feelings about diagnoses they have received. Patients will be guided to explore their level of understanding and acceptance of these diagnoses. Facilitator will encourage patients to process their thoughts and feelings about the reactions of others to their diagnosis and will guide patients in identifying ways to discuss their diagnosis with significant others in their lives. This group will be process-oriented, with patients participating in exploration of their own experiences, giving and receiving support, and processing challenge from other group members.   Therapeutic Goals: 1. Patient will demonstrate understanding of diagnosis as evidenced by identifying two or more symptoms of the disorder 2. Patient will be able to express two feelings regarding the diagnosis 3. Patient will demonstrate their ability to communicate their needs through discussion and/or role play  Summary of Patient Progress:  Patient did not attend.    Therapeutic Modalities:   Cognitive Behavioral Therapy Brief Therapy Feelings Identification   Penni HomansMichaela Lavilla Delamora, MSW, LCSW 08/11/2018 2:08 PM

## 2018-08-11 NOTE — Plan of Care (Signed)
Patient compliant with medication administration per MD orders. Patient isolative to his room other than coming out for snack and medications.   Problem: Education: Goal: Knowledge of the prescribed therapeutic regimen will improve Outcome: Progressing   Problem: Coping: Goal: Ability to interact with others will improve Outcome: Not Progressing

## 2018-08-11 NOTE — [facility] Group Notes (Signed)
[facility] Group Notes:  (Nursing/MHT/Case Management/Adjunct)  Date:  08/11/2018  Time:  10:03 PM  Type of Therapy:  Group Therapy  Participation Level:  Did Not Attend   [name] 08/11/2018, 10:03 PM

## 2018-08-11 NOTE — Progress Notes (Signed)
D - Patient was in his room upon arrival to the unit. Patient was pleasant during assessment and medication administration. Patient denies SI/HI/AVH, pain, anxiety and depression with this Clinical research associate. Patient was compliant with medications with this writer this evening. Patient was isolative to his room other than coming out for snack.   A - Patient refused medications this evening. Patient was isolative to his room other than getting snack. Patient given education on medications. Patient given support and encouragement. Patient informed to let staff know if there are any issues on the unit.   R - Patient being monitored Q 15 minutes for safety per unit protocol. Patient remains safe on the unit at this time.

## 2018-08-11 NOTE — Progress Notes (Signed)
[facility]  08/11/2018 8:48 AM Emeline GinsMohmed A Adamcik  MRN:  [id number]  Subjective:    There is no change in Mr. Normajean BaxterMustafa's demeanor. He denies any symptoms of depression, anxiety or psychosis. There are prominemt negative symptoms. He engages minimally. Spends time in his room mostly. Keeps asking about discharge.   There is a new phone number for the mother on the chart. We will continue to attempt contact.  Principal Problem: Schizoaffective disorder, bipolar type (HCC) Diagnosis: Principal Problem:   Schizoaffective disorder, bipolar type (HCC)  Total Time spent with patient: 15 minutes  Past Psychiatric History: schizoaffective disorder  Past Medical History:  Past Medical History:  Diagnosis Date  . Asthma   . Depression   . Psychosis [facility](HCC)     Past Surgical History:  Procedure Laterality Date  . BACK SURGERY     Family History: History reviewed. No pertinent family history. Family Psychiatric  History: none Social History:  Social History   Substance and Sexual Activity  Alcohol Use No     Social History   Substance and Sexual Activity  Drug Use Yes  . Types: Marijuana    Social History   Socioeconomic History  . Marital status: Single    Spouse name: Not on file  . Number of children: Not on file  . Years of education: Not on file  . Highest education level: Not on file  Occupational History  . Not on file  Social Needs  . Financial resource strain: Not on file  . Food insecurity:    Worry: Not on file    Inability: Not on file  . Transportation needs:    Medical: Not on file    Non-medical: Not on file  Tobacco Use  . Smoking status: Never Smoker  . Smokeless tobacco: Never Used  Substance and Sexual Activity  . Alcohol use: No  . Drug use: Yes    Types: Marijuana  . Sexual activity: Not Currently  Lifestyle  . Physical activity:    Days per week: Not on file    Minutes per session: Not on file  . Stress: Not on file   Relationships  . Social connections:    Talks on phone: Not on file    Gets together: Not on file    Attends religious service: Not on file    Active member of club or organization: Not on file    Attends meetings of clubs or organizations: Not on file    Relationship status: Not on file  Other Topics Concern  . Not on file  Social History Narrative  . Not on file   Additional Social History:                         Sleep: Fair  Appetite:  Fair  Current Medications: Current Facility-Administered Medications  Medication Dose Route Frequency Provider Last Rate Last Dose  . acetaminophen (TYLENOL) tablet 650 mg  650 mg Oral Q6H PRN Mariel CraftMaurer, Sheila M, MD      . alum & mag hydroxide-simeth (MAALOX/MYLANTA) 200-200-20 MG/5ML suspension 30 mL  30 mL Oral Q4H PRN Mariel CraftMaurer, Sheila M, MD      . cloZAPine (CLOZARIL) tablet 300 mg  300 mg Oral QHS Mariel CraftMaurer, Sheila M, MD   300 mg at 08/10/18 2214  . diphenhydrAMINE (BENADRYL) capsule 50 mg  50 mg Oral QHS PRN Mariel CraftMaurer, Sheila M, MD      . divalproex (DEPAKOTE) DR tablet  500 mg  500 mg Oral Q12H Mariel CraftMaurer, Sheila M, MD   500 mg at 08/11/18 0827  . fluvoxaMINE (LUVOX) tablet 25 mg  25 mg Oral QHS Mariel CraftMaurer, Sheila M, MD   25 mg at 08/10/18 2214  . haloperidol (HALDOL) tablet 10 mg  10 mg Oral QHS Mariel CraftMaurer, Sheila M, MD   10 mg at 08/10/18 2214  . magnesium hydroxide (MILK OF MAGNESIA) suspension 30 mL  30 mL Oral Daily PRN Mariel CraftMaurer, Sheila M, MD      . metFORMIN (GLUCOPHAGE) tablet 500 mg  500 mg Oral Q breakfast Mariel CraftMaurer, Sheila M, MD   500 mg at 08/11/18 0827  . metoprolol tartrate (LOPRESSOR) tablet 25 mg  25 mg Oral BID Awilda Covin B, MD   25 mg at 08/11/18 0827  . traZODone (DESYREL) tablet 100 mg  100 mg Oral QHS PRN Mariel CraftMaurer, Sheila M, MD   100 mg at 08/06/18 2139    Lab Results:  Results for orders placed or performed during the hospital encounter of 08/03/18 (from the past 48 hour(s))  Lipid panel     Status: Abnormal   Collection  Time: 08/10/18  7:00 AM  Result Value Ref Range   Cholesterol 175 0 - 200 mg/dL   Triglycerides 161103 <096<150 mg/dL   HDL 46 >04>40 mg/dL   Total CHOL/HDL Ratio 3.8 RATIO   VLDL 21 0 - 40 mg/dL   LDL Cholesterol 540108 (H) 0 - 99 mg/dL    Comment:        Total Cholesterol/HDL:CHD Risk Coronary Heart Disease Risk Table                     Men   Women  1/2 Average Risk   3.4   3.3  Average Risk       5.0   4.4  2 X Average Risk   9.6   7.1  3 X Average Risk  23.4   11.0        Use the calculated Patient Ratio above and the CHD Risk Table to determine the patient's CHD Risk.        ATP III CLASSIFICATION (LDL):  <100     mg/dL   Optimal  [id number]  mg/dL   Near or Above                    Optimal  130-159  mg/dL   Borderline  [id number]  mg/dL   High  >621>190     mg/dL   Very High Performed at [facility], [address] [place], [place] [address]   Hemoglobin A1c     Status: None   Collection Time: 08/10/18  7:00 AM  Result Value Ref Range   Hgb A1c MFr Bld 5.1 4.8 - 5.6 %    Comment: (NOTE) Pre diabetes:          5.7%-6.4% Diabetes:              >6.4% Glycemic control for   <7.0% adults with diabetes    Mean Plasma Glucose 99.67 mg/dL    Comment: Performed at [facility], [address] [place], [place] [address]  Valproic acid level     Status: Abnormal   Collection Time: 08/10/18  7:00 AM  Result Value Ref Range   Valproic Acid Lvl 114 (H) 50.0 - 100.0 ug/mL    Comment: Performed at [facility], [address] [place], [place] [address]  CBC with Differential/Platelet     Status: None   Collection Time: 08/10/18  7:00 AM  Result Value Ref Range   WBC 4.8 4.0 - 10.5 K/uL   RBC 5.71 4.22 - 5.81 MIL/uL   Hemoglobin 16.1 13.0 - 17.0 g/dL   HCT 16.1 09.6 - 04.5 %   MCV 82.0 80.0 - 100.0 fL   MCH 28.2 26.0 - 34.0 pg   MCHC 34.4 30.0 - 36.0 g/dL   RDW 40.9 81.1 - 91.4 %   Platelets 253 150 - 400 K/uL   nRBC 0.0 0.0 - 0.2 %   Neutrophils Relative % 56  %   Neutro Abs 2.7 1.7 - 7.7 K/uL   Lymphocytes Relative 36 %   Lymphs Abs 1.8 0.7 - 4.0 K/uL   Monocytes Relative 8 %   Monocytes Absolute 0.4 0.1 - 1.0 K/uL   Eosinophils Relative 0 %   Eosinophils Absolute 0.0 0.0 - 0.5 K/uL   Basophils Relative 0 %   Basophils Absolute 0.0 0.0 - 0.1 K/uL   Immature Granulocytes 0 %   Abs Immature Granulocytes 0.01 0.00 - 0.07 K/uL    Comment: Performed at [facility], [address] [place], [place] [address]    Blood Alcohol level:  Lab Results  Component Value Date   [facility] <10 08/02/2018   ETH <10 05/19/2018    Metabolic Disorder Labs: Lab Results  Component Value Date   HGBA1C 5.1 08/10/2018   MPG 99.67 08/10/2018   MPG 93.93 03/08/2018   No results found for: PROLACTIN Lab Results  Component Value Date   CHOL 175 08/10/2018   TRIG 103 08/10/2018   HDL 46 08/10/2018   CHOLHDL 3.8 08/10/2018   VLDL 21 08/10/2018   LDLCALC 108 (H) 08/10/2018   LDLCALC 168 (H) 03/08/2018    Physical Findings: AIMS:  , ,  ,  ,    CIWA:    COWS:     Musculoskeletal: Strength & Muscle Tone: within normal limits Gait & Station: normal Patient leans: N/A  Psychiatric Specialty Exam: Physical Exam  Nursing note and vitals reviewed. Psychiatric: His affect is blunt. His speech is delayed. He is slowed and withdrawn. Thought content is delusional. Cognition and memory are impaired. He expresses impulsivity.    Review of Systems  Neurological: Negative.   Psychiatric/Behavioral: Negative.   All other systems reviewed and are negative.   Blood pressure 138/87, pulse (!) 108, temperature 97.6 F (36.4 C), temperature source Oral, resp. rate 18, height 6' (1.829 m), weight 94.3 kg, SpO2 100 %.Body mass index is 28.21 kg/m.  General Appearance: Casual  Eye Contact:  Fair  Speech:  Clear and Coherent and Slow  Volume:  Decreased  Mood:  Euthymic  Affect:  Blunt  Thought Process:  Goal Directed and Descriptions of Associations:  Tangential  Orientation:  Full (Time, Place, and Person)  Thought Content:  Delusions and Paranoid Ideation  Suicidal Thoughts:  No  Homicidal Thoughts:  No  Memory:  Immediate;   Fair Recent;   Fair Remote;   Fair  Judgement:  Poor  Insight:  Lacking  Psychomotor Activity:  Decreased  Concentration:  Concentration: Fair and Attention Span: Fair  Recall:  [employer] of Knowledge:  Fair  Language:  Fair  Akathisia:  No  Handed:  Right  AIMS (if indicated):     Assets:  Communication Skills Desire for Improvement Financial Resources/Insurance Housing Physical Health Resilience Social Support  ADL's:  Intact  Cognition:  WNL  Sleep:  Number of Hours: 6.5     Treatment Plan Summary: Daily contact with patient to assess and evaluate symptoms and progress in treatment and Medication management   Mr. Blackmond is a 23 year old male with a history of treatment resistant schizophrenia admitted for another psychotic break in the context of treatment noncompliance.Since admission, his meds were restarted and he seems to tolerate well despite the polypharmacy, and his psychosis is improving.   #Psychosis, improving -continue Clozapine 300 mg nightly, still awaiting Clo level   -Haldol 10 mg nightly -Luvox 25 mg nightly for augmentation of Clozapine.  -Depakote 500 mg BID, VPA level 114 -Trazodone 100mg  qhs.   #HTN/Tachycardia -continue Metoprolol 25 mg BID  #Metabolic syndrome prevention -Metformin 500 mg BID  #Labs -lipid panel, TSH. A1C -EKG reviewed, sinus tachycardia with QTc 427  #Disposition -discharge with the mother -Follow up with [facility]  Kristine Linea, MD 08/11/2018, 8:48 AM

## 2018-08-11 NOTE — Progress Notes (Signed)
Patient is alert X 3, forwards very little, bizarre behaviors, stares out the windows and lays in his room in darkness. Poor eye contact as day progressed. Patient denies SI, HI and AVH.

## 2018-08-12 LAB — CBC WITH DIFFERENTIAL/PLATELET
Abs Immature Granulocytes: 0.03 10*3/uL (ref 0.00–0.07)
BASOS PCT: 0 %
Basophils Absolute: 0 10*3/uL (ref 0.0–0.1)
Eosinophils Absolute: 0 10*3/uL (ref 0.0–0.5)
Eosinophils Relative: 0 %
HCT: 49.2 % (ref 39.0–52.0)
Hemoglobin: 16.6 g/dL (ref 13.0–17.0)
Immature Granulocytes: 1 %
Lymphocytes Relative: 42 %
Lymphs Abs: 1.6 10*3/uL (ref 0.7–4.0)
MCH: 27.9 pg (ref 26.0–34.0)
MCHC: 33.7 g/dL (ref 30.0–36.0)
MCV: 82.6 fL (ref 80.0–100.0)
Monocytes Absolute: 0.2 10*3/uL (ref 0.1–1.0)
Monocytes Relative: 5 %
Neutro Abs: 2.1 10*3/uL (ref 1.7–7.7)
Neutrophils Relative %: 52 %
Platelets: 247 10*3/uL (ref 150–400)
RBC: 5.96 MIL/uL — AB (ref 4.22–5.81)
RDW: 12.3 % (ref 11.5–15.5)
WBC: 3.9 10*3/uL — ABNORMAL LOW (ref 4.0–10.5)
nRBC: 0 % (ref 0.0–0.2)

## 2018-08-12 MED ORDER — METOPROLOL TARTRATE 25 MG PO TABS: 25.0000 mg | ORAL_TABLET | Freq: Two times a day (BID) | ORAL | 1 refills | Status: DC

## 2018-08-12 MED ORDER — TRAZODONE HCL 100 MG PO TABS: 100.0000 mg | ORAL_TABLET | Freq: Every evening | ORAL | 1 refills | Status: DC | PRN

## 2018-08-12 MED ORDER — CLOZAPINE 100 MG PO TABS: 300.0000 mg | ORAL_TABLET | Freq: Every day | ORAL | Status: DC

## 2018-08-12 MED ORDER — METFORMIN HCL 500 MG PO TABS: 500.0000 mg | ORAL_TABLET | Freq: Every day | ORAL | 1 refills | Status: DC

## 2018-08-12 MED ORDER — FLUVOXAMINE MALEATE 25 MG PO TABS: 25.0000 mg | ORAL_TABLET | Freq: Every day | ORAL | 1 refills | Status: DC

## 2018-08-12 MED ORDER — HALOPERIDOL 10 MG PO TABS: 10.0000 mg | ORAL_TABLET | Freq: Every day | ORAL | 1 refills | Status: DC

## 2018-08-12 MED ORDER — DIVALPROEX SODIUM 500 MG PO DR TAB: 500.0000 mg | DELAYED_RELEASE_TABLET | Freq: Two times a day (BID) | ORAL | 1 refills | Status: DC

## 2018-08-12 NOTE — Progress Notes (Signed)
Recreation Therapy Notes  INPATIENT RECREATION TR PLAN  Patient Details Name: [name] [name] MRN: [id number] DOB: 03-Jan-1996 Today's Date: 08/12/2018  Rec Therapy Plan Is patient appropriate for Therapeutic Recreation?: Yes Treatment times per week: at least 3 Estimated Length of Stay: 5-7 days TR Treatment/Interventions: Group participation (Comment)  Discharge Criteria Pt will be discharged from therapy if:: Discharged Treatment plan/goals/alternatives discussed and agreed upon by:: Patient/family  Discharge Summary Short term goals set: Patient will engage in interactions with peers and staff in pro-social manner at least 2x within 5 recreation therapy group sessions Short term goals met: Not met Progress toward goals comments: Groups attended Which groups?: Coping skills Reason goals not met: Patient spent most of his time in his room Therapeutic equipment acquired: N/A Reason patient discharged from therapy: Discharge from hospital Pt/family agrees with progress & goals achieved: Yes Date patient discharged from therapy: 08/12/18   Neliah Cuyler 08/12/2018, 1:09 PM

## 2018-08-12 NOTE — [facility] Suicide Risk Assessment (Signed)
[facility] Discharge Suicide Risk Assessment   Principal Problem: Schizoaffective disorder, bipolar type [facility]) Discharge Diagnoses: Principal Problem:   Schizoaffective disorder, bipolar type (HCC)   Total Time spent with patient: 20 minutes  Musculoskeletal: Strength & Muscle Tone: within normal limits Gait & Station: normal Patient leans: N/A  Psychiatric Specialty Exam: Review of Systems  Neurological: Negative.   Psychiatric/Behavioral: Negative.   All other systems reviewed and are negative.   Blood pressure (!) 167/78, pulse 88, temperature 97.6 F (36.4 C), temperature source Oral, resp. rate 18, height 6' (1.829 m), weight 94.3 kg, SpO2 100 %.Body mass index is 28.21 kg/m.  General Appearance: Casual  Eye Contact::  Fair  Speech:  Clear and Coherent409  Volume:  Normal  Mood:  Euthymic  Affect:  Blunt  Thought Process:  Goal Directed and Descriptions of Associations: Intact  Orientation:  Full (Time, Place, and Person)  Thought Content:  WDL  Suicidal Thoughts:  No  Homicidal Thoughts:  No  Memory:  Immediate;   Fair Recent;   Fair Remote;   Fair  Judgement:  Impaired  Insight:  Shallow  Psychomotor Activity:  Decreased  Concentration:  Fair  Recall:  [employer] of Knowledge:Fair  Language: Fair  Akathisia:  No  Handed:  Right  AIMS (if indicated):     Assets:  Communication Skills Desire for Improvement Financial Resources/Insurance Housing Physical Health Resilience Social Support  Sleep:  Number of Hours: 6  Cognition: WNL  ADL's:  Intact   Mental Status Per Nursing Assessment::   On Admission:  NA  Demographic Factors:  Male, Adolescent or young adult and Unemployed  Loss Factors: Decrease in vocational status  Historical Factors: Impulsivity  Risk Reduction Factors:   Sense of responsibility to family, Living with another person, especially a relative, Positive social support and Positive therapeutic relationship  Continued Clinical  Symptoms:  Bipolar Disorder:   Mixed State  Cognitive Features That Contribute To Risk:  None    Suicide Risk:  Minimal: No identifiable suicidal ideation.  Patients presenting with no risk factors but with morbid ruminations; may be classified as minimal risk based on the severity of the depressive symptoms  Follow-up Information    [address] [facility] [place] [employer]. Follow up.   Why:  Please follow up with your ACT team. Contact information: [address] [place] [address] [phone]           Plan Of Care/Follow-up recommendations:  Activity:  as tolerated Diet:  low sodium heart healthy Other:  keep follow up appointments  Kristine Linea, MD 08/12/2018, 9:37 AM

## 2018-08-12 NOTE — Progress Notes (Signed)
  [place] Adult Case Management Discharge Plan :  Will you be returning to the same living situation after discharge:  Yes,  lives with mother At discharge, do you have transportation home?: Yes,  Aaron(ACT team member) will provide tranpsortation Do you have the ability to pay for your medications: Yes,  insurance  Release of information consent forms completed and in the chart;  Patient's signature needed at discharge.  Patient to Follow up at: Follow-up Information    [address] [facility] [place], [employer]. Follow up.   Why:  Your ACT team will follow up with you on 08/12/2018. Thank you. Contact information: [address] [place] [place] [address] [place] [address] [phone]           Next level of care provider has access to [facility] Link:no  Safety Planning and Suicide Prevention discussed: Yes,  with pt: pt declined family contact  Have you used any form of tobacco in the last 30 days? (Cigarettes, Smokeless Tobacco, Cigars, and/or Pipes): No  Has patient been referred to the Quitline?: N/A patient is not a smoker  Patient has been referred for addiction treatment: N/A  Suzan Slick, LCSW 08/12/2018, 12:40 PM

## 2018-08-12 NOTE — Discharge Summary (Signed)
Physician Discharge Summary Note  Patient:  [name] [name] is an 23 y.o., male MRN:  [id number] DOB:  1996/04/10 Patient phone:  [phone] (home)  Patient address:   [address] [place] [address],  Total Time spent with patient: 20 minutes plus 15 min on care coordination and documentation  Date of Admission:  08/03/2018 Date of Discharge: 08/12/2018  Reason for Admission:  Psychotic break.  History of Present Illness:   Identifying data. [name] [name] is a 23 year old male with a history of treatment resistant schizophrenia.  Chief complaint. "I don't know."  History of present illness. Information was obtained from the patient and the chart. The patient was brought to the hospital by [facility] police at the urging of his mother. Reportedly, he has been refusing medications, got into an argument with his mother and run away from home. The patient himself provides little information. He is responding to internal stimuli, with psychomotor retardation and poor eye contact. On direct questioning, he denies any symptoms of depression, anxiety or psychosis. He is not suicidal or homicidal. He reports good compliance with treatment. He does not know who his psychiatrist is.  When I returned to his room, the patient is standing still, face turned away from me, only nods. Does not feel like talking now.  Past psychiatric history. Several psychiatric admissions since the age of 31. Tried on multiple medications. Responds to Clozapine but compliance has been a problem. In the fall, he was discharged to the care of [facility] ACT team.  Family psychiatric history. None.  Social history. He lives with his mother and brother, refuges from [place].  Principal Problem: Schizoaffective disorder, bipolar type [facility]) Discharge Diagnoses: Principal Problem:   Schizoaffective disorder, bipolar type [facility])  Past Medical History:  Past Medical History:  Diagnosis Date  .  Asthma   . Depression   . Psychosis [facility])     Past Surgical History:  Procedure Laterality Date  . BACK SURGERY     Family History: History reviewed. No pertinent family history.  Social History:  Social History   Substance and Sexual Activity  Alcohol Use No     Social History   Substance and Sexual Activity  Drug Use Yes  . Types: Marijuana    Social History   Socioeconomic History  . Marital status: Single    Spouse name: Not on file  . Number of children: Not on file  . Years of education: Not on file  . Highest education level: Not on file  Occupational History  . Not on file  Social Needs  . Financial resource strain: Not on file  . Food insecurity:    Worry: Not on file    Inability: Not on file  . Transportation needs:    Medical: Not on file    Non-medical: Not on file  Tobacco Use  . Smoking status: Never Smoker  . Smokeless tobacco: Never Used  Substance and Sexual Activity  . Alcohol use: No  . Drug use: Yes    Types: Marijuana  . Sexual activity: Not Currently  Lifestyle  . Physical activity:    Days per week: Not on file    Minutes per session: Not on file  . Stress: Not on file  Relationships  . Social connections:    Talks on phone: Not on file    Gets together: Not on file    Attends religious service: Not on file    Active member of club or  organization: Not on file    Attends meetings of clubs or organizations: Not on file    Relationship status: Not on file  Other Topics Concern  . Not on file  Social History Narrative  . Not on file    Hospital Course:    [name] [name] is a 23 year old male with a history of treatment resistant schizophrenia admitted for another psychotic break in the context of treatment noncompliance for four weeks. He was restarted on his medications and tolerated them well. His levels are therapeutic. At the time of discharge, the patient is no longer psychotic. He agrees to continue medications at home.  Unfortunately, he would not consider injectable Haldol decanoate.    #Psychosis, improved -continue Clozapine 300 mg nightly, Clo+Norclo level 566 on 08/08/2018 -Haldol 10 mg nightly -Luvox 25 mg nightly for augmentation of Clozapine.  -Depakote 500 mg BID, VPA level 114 -Trazodone 100 mg qhs.   #HTN/Tachycardia -continue Metoprolol25 mgBID  #Metabolic syndrome prevention -Metformin 500 mg BID  #Labs -lipid panel, TSH. A1C are normal -EKGreviewed, sinus tachycardia with QTc 427  #Disposition -discharge with the mother -Follow up with [name]  Physical Findings: AIMS:  , ,  ,  ,    CIWA:    COWS:     Musculoskeletal: Strength & Muscle Tone: within normal limits Gait & Station: normal Patient leans: N/A  Psychiatric Specialty Exam: Physical Exam  Nursing note and vitals reviewed. Psychiatric: His speech is normal and behavior is normal. Thought content normal. His affect is blunt. Cognition and memory are normal. He expresses impulsivity.    Review of Systems  Neurological: Negative.   Psychiatric/Behavioral: Negative.   All other systems reviewed and are negative.   Blood pressure (!) 167/78, pulse 88, temperature 97.6 F (36.4 C), temperature source Oral, resp. rate 18, height 6' (1.829 m), weight 94.3 kg, SpO2 100 %.Body mass index is 28.21 kg/m.  General Appearance: Casual  Eye Contact:  Fair  Speech:  Clear and Coherent  Volume:  Normal  Mood:  Euthymic  Affect:  Blunt  Thought Process:  Goal Directed and Descriptions of Associations: Intact  Orientation:  Full (Time, Place, and Person)  Thought Content:  WDL  Suicidal Thoughts:  No  Homicidal Thoughts:  No  Memory:  Immediate;   Fair Recent;   Fair Remote;   Good  Judgement:  Impaired  Insight:  Shallow  Psychomotor Activity:  Decreased  Concentration:  Concentration: Fair and Attention Span: Fair  Recall:  [employer] of Knowledge:  Fair  Language:  Fair  Akathisia:  No  Handed:   Right  AIMS (if indicated):     Assets:  Communication Skills Desire for Improvement Financial Resources/Insurance Housing Physical Health Resilience Social Support  ADL's:  Intact  Cognition:  WNL  Sleep:  Number of Hours: 6     Have you used any form of tobacco in the last 30 days? (Cigarettes, Smokeless Tobacco, Cigars, and/or Pipes): No  Has this patient used any form of tobacco in the last 30 days? (Cigarettes, Smokeless Tobacco, Cigars, and/or Pipes) Yes, No  Blood Alcohol level:  Lab Results  Component Value Date   ETH <10 08/02/2018   ETH <10 05/19/2018    Metabolic Disorder Labs:  Lab Results  Component Value Date   HGBA1C 5.1 08/10/2018   MPG 99.67 08/10/2018   MPG 93.93 03/08/2018   No results found for: PROLACTIN Lab Results  Component Value Date   CHOL 175 08/10/2018   TRIG  103 08/10/2018   HDL 46 08/10/2018   CHOLHDL 3.8 08/10/2018   VLDL 21 08/10/2018   LDLCALC 108 (H) 08/10/2018   LDLCALC 168 (H) 03/08/2018    See Psychiatric Specialty Exam and Suicide Risk Assessment completed by Attending Physician prior to discharge.  Discharge destination:  Home  Is patient on multiple antipsychotic therapies at discharge:  Yes,   Do you recommend tapering to monotherapy for antipsychotics?  Yes   Has Patient had three or more failed trials of antipsychotic monotherapy by history:  Yes,   Antipsychotic medications that previously failed include:   1.  zyprexa., 2.  abilify. and 3.  clozapine.  Recommended Plan for Multiple Antipsychotic Therapies: Second antipsychotic is Clozapine.  Reason for adding Clozapine inadequate response to a single agent  Discharge Instructions    Diet - low sodium heart healthy   Complete by:  As directed    Increase activity slowly   Complete by:  As directed      Allergies as of 08/12/2018   No Known Allergies     Medication List    TAKE these medications     Indication  cloZAPine 100 MG tablet Commonly known as:   CLOZARIL Take 3 tablets (300 mg total) by mouth at bedtime.  Indication:  Schizophrenia that does Not Respond to Usual Drug Therapy   divalproex 500 MG DR tablet Commonly known as:  DEPAKOTE Take 1 tablet (500 mg total) by mouth every 12 (twelve) hours.  Indication:  Schizophrenia   fluvoxaMINE 25 MG tablet Commonly known as:  LUVOX Take 1 tablet (25 mg total) by mouth at bedtime.  Indication:  augmentation of Clozapine   haloperidol 10 MG tablet Commonly known as:  HALDOL Take 1 tablet (10 mg total) by mouth at bedtime.  Indication:  Schizophrenia   metFORMIN 500 MG tablet Commonly known as:  GLUCOPHAGE Take 1 tablet (500 mg total) by mouth daily with breakfast.  Indication:  Antipsychotic Therapy-Induced Weight Gain   metoprolol tartrate 25 MG tablet Commonly known as:  LOPRESSOR Take 1 tablet (25 mg total) by mouth 2 (two) times daily. What changed:  how much to take  Indication:  Supraventricular Tachycardia   traZODone 100 MG tablet Commonly known as:  DESYREL Take 1 tablet (100 mg total) by mouth at bedtime as needed for sleep.  Indication:  Trouble Sleeping      Follow-up Information    [facility] [place] Follow up.   Why:  Please follow up with your ACT team. Contact information: [address] [place] [id number] [phone]           Follow-up recommendations:  Activity:  as tolerated Diet:  low sodium heart healthy Other:  keep follow up appointments  Comments:     Signed: Kristine LineaJolanta [name], MD 08/12/2018, 9:40 AM

## 2018-08-12 NOTE — B[facility] Group Notes (Signed)
Emotional Regulation 08/12/2018 1PM  Type of Therapy/Topic:  Group Therapy:  Emotion Regulation  Participation Level:  Did Not Attend   Description of Group:   The purpose of this group is to assist patients in learning to regulate negative emotions and experience positive emotions. Patients will be guided to discuss ways in which they have been vulnerable to their negative emotions. These vulnerabilities will be juxtaposed with experiences of positive emotions or situations, and patients will be challenged to use positive emotions to combat negative ones. Special emphasis will be placed on coping with negative emotions in conflict situations, and patients will process healthy conflict resolution skills.  Therapeutic Goals: 1. Patient will identify two positive emotions or experiences to reflect on in order to balance out negative emotions 2. Patient will label two or more emotions that they find the most difficult to experience 3. Patient will demonstrate positive conflict resolution skills through discussion and/or role plays  Summary of Patient Progress:       Therapeutic Modalities:   Cognitive Behavioral Therapy Feelings Identification Dialectical Behavioral Therapy   Suzan Slick, LCSW 08/12/2018 1:54 PM

## 2018-08-12 NOTE — Progress Notes (Signed)
Recreation Therapy Notes  Date: 08/12/2018  Time: 9:30 am   Location: Craft room   Behavioral response: N/A   Intervention Topic: Problem Solving  Discussion/Intervention: Patient did not attend group.   Clinical Observations/Feedback:  Patient did not attend group.   Cristan Hout LRT/CTRS         Cataldo Cosgriff 08/12/2018 10:26 AM

## 2018-08-12 NOTE — Progress Notes (Signed)
D: Pt denies SI/HI/AVH. Pt is pleasant and cooperative. Pt presented very paranoid. Pt kept to his room much of the evening.   A: Pt was offered support and encouragement. Pt was given scheduled medications. Pt was encourage to attend groups. Q 15 minute checks were done for safety.   R:Pt attends groups and interacts well with peers and staff. Pt is taking medication. Pt has no complaints.Pt receptive to treatment and safety maintained on unit.   Problem: Coping: Goal: Ability to interact with others will improve Outcome: Not Progressing   Problem: Education: Goal: Emotional status will improve Outcome: Progressing   Problem: Health Behavior/Discharge Planning: Goal: Compliance with prescribed medication regimen will improve Outcome: Progressing

## 2019-01-01 ENCOUNTER — Emergency Department
Admission: EM | Admit: 2019-01-01 | Discharge: 2019-01-02 | Disposition: A | Discharge status: Other Acute Inpatient Hospital | Payer: Medicaid Other | Attending: Emergency Medicine | Admitting: Emergency Medicine

## 2019-01-01 ENCOUNTER — Encounter: Payer: Self-pay | Admitting: Emergency Medicine

## 2019-01-01 ENCOUNTER — Other Ambulatory Visit: Payer: Self-pay

## 2019-01-01 DIAGNOSIS — Z046 Encounter for general psychiatric examination, requested by authority: Secondary | ICD-10-CM | POA: Insufficient documentation

## 2019-01-01 DIAGNOSIS — Z03818 Encounter for observation for suspected exposure to other biological agents ruled out: Secondary | ICD-10-CM | POA: Insufficient documentation

## 2019-01-01 DIAGNOSIS — I10 Essential (primary) hypertension: Secondary | ICD-10-CM | POA: Insufficient documentation

## 2019-01-01 DIAGNOSIS — Z9114 Patient's other noncompliance with medication regimen: Secondary | ICD-10-CM | POA: Diagnosis not present

## 2019-01-01 DIAGNOSIS — F419 Anxiety disorder, unspecified: Secondary | ICD-10-CM | POA: Diagnosis not present

## 2019-01-01 DIAGNOSIS — F250 Schizoaffective disorder, bipolar type: Secondary | ICD-10-CM | POA: Diagnosis not present

## 2019-01-01 DIAGNOSIS — Z79899 Other long term (current) drug therapy: Secondary | ICD-10-CM | POA: Insufficient documentation

## 2019-01-01 DIAGNOSIS — F22 Delusional disorders: Secondary | ICD-10-CM | POA: Diagnosis present

## 2019-01-01 DIAGNOSIS — J45909 Unspecified asthma, uncomplicated: Secondary | ICD-10-CM | POA: Insufficient documentation

## 2019-01-01 DIAGNOSIS — Z7984 Long term (current) use of oral hypoglycemic drugs: Secondary | ICD-10-CM | POA: Diagnosis not present

## 2019-01-01 LAB — COMPREHENSIVE METABOLIC PANEL
ALT: 37 U/L (ref 0–44)
AST: 26 U/L (ref 15–41)
Albumin: 4.8 g/dL (ref 3.5–5.0)
Alkaline Phosphatase: 103 U/L (ref 38–126)
Anion gap: 16 — ABNORMAL HIGH (ref 5–15)
BUN: 12 mg/dL (ref 6–20)
CO2: 19 mmol/L — ABNORMAL LOW (ref 22–32)
Calcium: 9.3 mg/dL (ref 8.9–10.3)
Chloride: 102 mmol/L (ref 98–111)
Creatinine, Ser: 1 mg/dL (ref 0.61–1.24)
GFR calc Af Amer: >60 mL/min (ref >60)
GFR calc non Af Amer: >60 mL/min (ref >60)
Glucose, Bld: 84 mg/dL (ref 70–99)
Potassium: 3.4 mmol/L — ABNORMAL LOW (ref 3.5–5.1)
Sodium: 137 mmol/L (ref 135–145)
Total Bilirubin: 2.4 mg/dL — ABNORMAL HIGH (ref 0.3–1.2)
Total Protein: 7.6 g/dL (ref 6.5–8.1)

## 2019-01-01 LAB — CBC WITH DIFFERENTIAL/PLATELET
Abs Immature Granulocytes: 0.02 10*3/uL (ref 0.00–0.07)
Basophils Absolute: 0 10*3/uL (ref 0.0–0.1)
Basophils Relative: 0 %
Eosinophils Absolute: 0 10*3/uL (ref 0.0–0.5)
Eosinophils Relative: 0 %
HCT: 43.7 % (ref 39.0–52.0)
Hemoglobin: 15.6 g/dL (ref 13.0–17.0)
Immature Granulocytes: 0 %
Lymphocytes Relative: 27 %
Lymphs Abs: 1.4 10*3/uL (ref 0.7–4.0)
MCH: 28.1 pg (ref 26.0–34.0)
MCHC: 35.7 g/dL (ref 30.0–36.0)
MCV: 78.6 fL — ABNORMAL LOW (ref 80.0–100.0)
Monocytes Absolute: 0.5 10*3/uL (ref 0.1–1.0)
Monocytes Relative: 10 %
Neutro Abs: 3.2 10*3/uL (ref 1.7–7.7)
Neutrophils Relative %: 63 %
Platelets: 244 10*3/uL (ref 150–400)
RBC: 5.56 MIL/uL (ref 4.22–5.81)
RDW: 12.7 % (ref 11.5–15.5)
WBC: 5.1 10*3/uL (ref 4.0–10.5)
nRBC: 0 % (ref 0.0–0.2)

## 2019-01-01 LAB — LIPASE, BLOOD: Lipase: 20 U/L (ref 11–51)

## 2019-01-01 LAB — SALICYLATE LEVEL: Salicylate Lvl: <7 mg/dL (ref 2.8–30.0)

## 2019-01-01 LAB — ETHANOL: Alcohol, Ethyl (B): <10 mg/dL (ref <10)

## 2019-01-01 LAB — ACETAMINOPHEN LEVEL: Acetaminophen (Tylenol), Serum: <10 ug/mL — ABNORMAL LOW (ref 10–30)

## 2019-01-01 LAB — CK: Total CK: 505 U/L — ABNORMAL HIGH (ref 49–397)

## 2019-01-01 MED ORDER — LORAZEPAM 2 MG/ML IJ SOLN
2.0000 mg | Freq: Once | INTRAMUSCULAR | Status: AC
  Administered 2019-01-01: 18:00:00 2 mg via INTRAMUSCULAR
  Filled 2019-01-01: qty 1

## 2019-01-01 MED ORDER — HALOPERIDOL LACTATE 5 MG/ML IJ SOLN
INTRAMUSCULAR | Status: AC
  Administered 2019-01-01: 17:00:00 10 mg via INTRAMUSCULAR
  Filled 2019-01-01: qty 2

## 2019-01-01 MED ORDER — HALOPERIDOL LACTATE 5 MG/ML IJ SOLN
10.0000 mg | Freq: Once | INTRAMUSCULAR | Status: AC
  Administered 2019-01-01: 10 mg via INTRAMUSCULAR

## 2019-01-01 MED ORDER — DIPHENHYDRAMINE HCL 50 MG/ML IJ SOLN
50.0000 mg | Freq: Once | INTRAMUSCULAR | Status: AC
  Administered 2019-01-01: 50 mg via INTRAMUSCULAR
  Filled 2019-01-01: qty 1

## 2019-01-01 NOTE — ED Triage Notes (Signed)
Patient presents to the ED with schizophrenia.  Patient presents via EMS.  Patient has not been taking his regular medications.  Patient is re-directable per EMS.  Patient does not want psych eval.  Patient is having difficulties answering questions.  Patient appears very anxious.

## 2019-01-01 NOTE — ED Notes (Addendum)
Pt pacing room, unwilling to dress out or allow VS check. Pt repeatedly asking for food, explained food can be brought as soon as pt is dressed out. Pt refusing at this time. Medication ordered for safety of pt and staff.

## 2019-01-01 NOTE — ED Notes (Signed)
Pt cooperative with dress out process at this time. Belongings include: black flip flops, black t-shirt, and gray sweatpants. Pt allowed EDT Edison Nasuti to draw blood. Informed of need for urine sample when able.

## 2019-01-01 NOTE — ED Notes (Signed)
Pt remains agitated, refusing to dress out and pacing in room. Additional medication ordered for safety of patient and staff.

## 2019-01-01 NOTE — ED Notes (Signed)
Pt. Currently sleeping in room. 

## 2019-01-01 NOTE — ED Provider Notes (Signed)
[facility] Emergency Department Provider Note  ____________________________________________  Time seen: Approximately 10:23 PM  I have reviewed the triage vital signs and the nursing notes.   HISTORY  Chief Complaint Psychiatric Evaluation    Level 5 Caveat: Portions of the History and Physical including HPI and review of systems are unable to be completely obtained due to patient being a poor historian   HPI [name] [name] is a 23 y.o. male with a history of schizoaffective disorder, medication noncompliance who was brought to the ED by his mother due to laying on the floor for the past 2 days, unwilling to get up.  She reports that he has not been taking his medicine for the last month.  She reports that he seems to be very stiff when he does move.  She reports that he was normal and highly functional, in college until he was [age] and had a psychotic break at which point he was diagnosed with schizoaffective disorder.   Patient denies any symptoms.  He standing in the room, appears apprehensive, repeats that everything is okay.   Past Medical History:  Diagnosis Date  . Asthma   . Depression   . Psychosis [facility])      Patient Active Problem List   Diagnosis Date Noted  . Hypertension 03/06/2018  . Schizoaffective disorder, bipolar type (HCC) 12/30/2017  . Noncompliance 12/30/2017  . Aggressive behavior of adolescent 10/24/2015     Past Surgical History:  Procedure Laterality Date  . BACK SURGERY       Prior to Admission medications   Medication Sig Start Date End Date Taking? Authorizing Provider  cloZAPine (CLOZARIL) 100 MG tablet Take 3 tablets (300 mg total) by mouth at bedtime. 08/12/18   [name], Braulio ConteJolanta B, MD  divalproex (DEPAKOTE) 500 MG DR tablet Take 1 tablet (500 mg total) by mouth every 12 (twelve) hours. 08/12/18   [name], [name], MD  fluvoxaMINE (LUVOX) 25 MG tablet Take 1 tablet (25 mg total) by mouth at bedtime.  08/12/18   [name], Braulio ConteJolanta B, MD  haloperidol (HALDOL) 10 MG tablet Take 1 tablet (10 mg total) by mouth at bedtime. 08/12/18   [name], Braulio ConteJolanta B, MD  metFORMIN (GLUCOPHAGE) 500 MG tablet Take 1 tablet (500 mg total) by mouth daily with breakfast. 08/12/18   [name], [name], MD  metoprolol tartrate (LOPRESSOR) 25 MG tablet Take 1 tablet (25 mg total) by mouth 2 (two) times daily. 08/12/18   [name], Braulio ConteJolanta B, MD  traZODone (DESYREL) 100 MG tablet Take 1 tablet (100 mg total) by mouth at bedtime as needed for sleep. 08/12/18   [name], Ellin GoodieJolanta B, MD     Allergies Patient has no known allergies.   No family history on file.  Social History Social History   Tobacco Use  . Smoking status: Never Smoker  . Smokeless tobacco: Never Used  Substance Use Topics  . Alcohol use: No  . Drug use: Yes    Types: Marijuana    Review of Systems Level 5 Caveat: Portions of the History and Physical including HPI and review of systems are unable to be completely obtained due to patient being a poor historian   Constitutional:   No known fever.  ENT:   No rhinorrhea. Cardiovascular:   No chest pain or syncope. Respiratory:   No dyspnea or cough. Gastrointestinal:   Negative for abdominal pain, vomiting and diarrhea.  Musculoskeletal:   Negative for focal pain or swelling ____________________________________________   PHYSICAL EXAM:  VITAL SIGNS: ED Triage Vitals [01/01/19 1756]  Enc Vitals Group     BP 136/78     Pulse Rate (!) 107     Resp 16     Temp 98 F (36.7 C)     Temp Source Oral     SpO2 97 %     Weight      Height      Head Circumference      Peak Flow      Pain Score      Pain Loc      Pain Edu?      Excl. in GC?     Vital signs reviewed, nursing assessments reviewed. Exam limited by patient uncooperative  Constitutional:   Awake and alert. Non-toxic appearance. Eyes:   Conjunctivae are normal. ENT      Head:   Normocephalic and atraumatic.        Neck:   No meningismus. Full ROM. Cardiovascular: Regular rate rhythm Pulmonary: Lungs clear to auscultation bilaterally Abdominal: Soft nontender Musculoskeletal:   Normal range of motion in all extremities.  Neurologic:   Normal speech, limited language expression. Appears paranoid, hypervigilant, apprehensive Motor grossly intact.  Ambulatory with steady gait, normal coordination and balance No acute focal neurologic deficits are appreciated.    ____________________________________________    LABS (pertinent positives/negatives) (all labs ordered are listed, but only abnormal results are displayed) Labs Reviewed  ACETAMINOPHEN LEVEL - Abnormal; Notable for the following components:      Result Value   Acetaminophen (Tylenol), Serum <10 (*)    All other components within normal limits  COMPREHENSIVE METABOLIC PANEL - Abnormal; Notable for the following components:   Potassium 3.4 (*)    CO2 19 (*)    Total Bilirubin 2.4 (*)    Anion gap 16 (*)    All other components within normal limits  CBC WITH DIFFERENTIAL/PLATELET - Abnormal; Notable for the following components:   MCV 78.6 (*)    All other components within normal limits  CK - Abnormal; Notable for the following components:   Total CK 505 (*)    All other components within normal limits  ETHANOL  SALICYLATE LEVEL  LIPASE, BLOOD  URINE DRUG SCREEN, QUALITATIVE ([facility] ONLY)   ____________________________________________   EKG    ____________________________________________    RADIOLOGY  No results found.  ____________________________________________   PROCEDURES Procedures  ____________________________________________    CLINICAL IMPRESSION / ASSESSMENT AND PLAN / ED COURSE  Pertinent labs & imaging results that were available during my care of the patient were reviewed by me and considered in my medical decision making (see chart for details).   [name] [name] was evaluated in Emergency  Department on 01/01/2019 for the symptoms described in the history of present illness. He was evaluated in the context of the global COVID-19 pandemic, which necessitated consideration that the patient might be at risk for infection with the SARS-CoV-2 virus that causes COVID-19. Institutional protocols and algorithms that pertain to the evaluation of patients at risk for COVID-19 are in a state of rapid change based on information released by regulatory bodies including the CDC and federal and state organizations. These policies and algorithms were followed during the patient's care in the ED.   Patient presents with behavioral disturbance, medication noncompliance, likely exacerbation of his underlying schizoaffective disorder.  No acute medical complaints, exam reassuring, medically stable.  IVC initiated pending psychiatry evaluation.  Patient given 10 mg Haldol without sufficient calming to allow for safe evaluation,  so Ativan and Benadryl ordered as well.      ____________________________________________   FINAL CLINICAL IMPRESSION(S) / ED DIAGNOSES    Final diagnoses:  Schizoaffective disorder, bipolar type [facility]     ED Discharge Orders    None      Portions of this note were generated with dragon dictation software. Dictation errors may occur despite best attempts at proofreading.   Carrie Mew, MD 01/01/19 2228

## 2019-01-02 ENCOUNTER — Inpatient Hospital Stay
Admission: AD | Admit: 2019-01-02 | Discharge: 2019-01-27 | DRG: 885 | Disposition: A | Discharge status: Home / Self Care | Payer: Medicaid Other | Attending: Psychiatry | Admitting: Psychiatry

## 2019-01-02 ENCOUNTER — Other Ambulatory Visit: Payer: Self-pay

## 2019-01-02 DIAGNOSIS — F250 Schizoaffective disorder, bipolar type: Secondary | ICD-10-CM | POA: Diagnosis present

## 2019-01-02 DIAGNOSIS — Z1159 Encounter for screening for other viral diseases: Secondary | ICD-10-CM

## 2019-01-02 DIAGNOSIS — R748 Abnormal levels of other serum enzymes: Secondary | ICD-10-CM | POA: Diagnosis present

## 2019-01-02 DIAGNOSIS — Z91199 Patient's noncompliance with other medical treatment and regimen due to unspecified reason: Secondary | ICD-10-CM

## 2019-01-02 DIAGNOSIS — F259 Schizoaffective disorder, unspecified: Secondary | ICD-10-CM | POA: Diagnosis present

## 2019-01-02 DIAGNOSIS — I10 Essential (primary) hypertension: Secondary | ICD-10-CM | POA: Diagnosis present

## 2019-01-02 DIAGNOSIS — F1290 Cannabis use, unspecified, uncomplicated: Secondary | ICD-10-CM | POA: Diagnosis present

## 2019-01-02 DIAGNOSIS — N3944 Nocturnal enuresis: Secondary | ICD-10-CM | POA: Diagnosis present

## 2019-01-02 DIAGNOSIS — R4689 Other symptoms and signs involving appearance and behavior: Secondary | ICD-10-CM | POA: Diagnosis present

## 2019-01-02 DIAGNOSIS — J45909 Unspecified asthma, uncomplicated: Secondary | ICD-10-CM | POA: Diagnosis present

## 2019-01-02 DIAGNOSIS — Z9119 Patient's noncompliance with other medical treatment and regimen: Secondary | ICD-10-CM

## 2019-01-02 DIAGNOSIS — Z91148 Patient's other noncompliance with medication regimen for other reason: Secondary | ICD-10-CM

## 2019-01-02 LAB — CK: Total CK: 563 U/L — ABNORMAL HIGH (ref 49–397)

## 2019-01-02 LAB — SARS CORONAVIRUS 2 BY RT PCR (HOSPITAL ORDER, PERFORMED IN ~~LOC~~ HOSPITAL LAB): SARS Coronavirus 2: NEGATIVE

## 2019-01-02 LAB — VALPROIC ACID LEVEL: Valproic Acid Lvl: <10 ug/mL — ABNORMAL LOW (ref 50.0–100.0)

## 2019-01-02 MED ORDER — DIVALPROEX SODIUM 500 MG PO DR TAB
500.0000 mg | DELAYED_RELEASE_TABLET | Freq: Two times a day (BID) | ORAL | Status: DC
  Administered 2019-01-02 – 2019-01-27 (×49): 500 mg via ORAL
  Filled 2019-01-02 (×49): qty 1

## 2019-01-02 MED ORDER — CLOZAPINE 25 MG PO TABS: 50.0000 mg | ORAL_TABLET | Freq: Two times a day (BID) | ORAL | Status: DC

## 2019-01-02 MED ORDER — METFORMIN HCL 500 MG PO TABS: 500.0000 mg | ORAL_TABLET | Freq: Every day | ORAL | Status: DC

## 2019-01-02 MED ORDER — HYDROXYZINE HCL 25 MG PO TABS
25.0000 mg | ORAL_TABLET | Freq: Four times a day (QID) | ORAL | Status: DC | PRN
  Administered 2019-01-07 – 2019-01-15 (×2): 25 mg via ORAL
  Filled 2019-01-02 (×2): qty 1

## 2019-01-02 MED ORDER — ACETAMINOPHEN 325 MG PO TABS: 650.0000 mg | ORAL_TABLET | Freq: Four times a day (QID) | ORAL | Status: DC | PRN

## 2019-01-02 MED ORDER — TRAZODONE HCL 50 MG PO TABS: 50.0000 mg | ORAL_TABLET | Freq: Every evening | ORAL | Status: DC | PRN

## 2019-01-02 MED ORDER — TRAZODONE HCL 100 MG PO TABS: 100.0000 mg | ORAL_TABLET | Freq: Every evening | ORAL | Status: DC | PRN

## 2019-01-02 MED ORDER — DIVALPROEX SODIUM 500 MG PO DR TAB
500.0000 mg | DELAYED_RELEASE_TABLET | Freq: Two times a day (BID) | ORAL | Status: DC
  Administered 2019-01-02: 500 mg via ORAL
  Filled 2019-01-02: qty 1

## 2019-01-02 MED ORDER — DIVALPROEX SODIUM 500 MG PO DR TAB: 500.0000 mg | DELAYED_RELEASE_TABLET | Freq: Two times a day (BID) | ORAL | Status: DC

## 2019-01-02 MED ORDER — ZIPRASIDONE MESYLATE 20 MG IM SOLR: 20.0000 mg | Freq: Four times a day (QID) | INTRAMUSCULAR | Status: DC | PRN

## 2019-01-02 MED ORDER — METOPROLOL TARTRATE 25 MG PO TABS: 25.0000 mg | ORAL_TABLET | Freq: Two times a day (BID) | ORAL | Status: DC

## 2019-01-02 MED ORDER — MAGNESIUM HYDROXIDE 400 MG/5ML PO SUSP: 30.0000 mL | Freq: Every day | ORAL | Status: DC | PRN

## 2019-01-02 MED ORDER — FLUVOXAMINE MALEATE 50 MG PO TABS
25.0000 mg | ORAL_TABLET | Freq: Every day | ORAL | Status: DC
  Administered 2019-01-02 – 2019-01-26 (×23): 25 mg via ORAL
  Filled 2019-01-02 (×24): qty 1

## 2019-01-02 MED ORDER — ALUM & MAG HYDROXIDE-SIMETH 200-200-20 MG/5ML PO SUSP: 30.0000 mL | ORAL | Status: DC | PRN

## 2019-01-02 MED ORDER — CLOZAPINE 25 MG PO TABS
50.0000 mg | ORAL_TABLET | Freq: Two times a day (BID) | ORAL | Status: DC
  Administered 2019-01-02 – 2019-01-03 (×2): 50 mg via ORAL
  Filled 2019-01-02 (×2): qty 2

## 2019-01-02 MED ORDER — FLUVOXAMINE MALEATE 50 MG PO TABS: 50.0000 mg | ORAL_TABLET | Freq: Every day | ORAL | Status: DC

## 2019-01-02 MED ORDER — HALOPERIDOL 5 MG PO TABS: 10.0000 mg | ORAL_TABLET | Freq: Every day | ORAL | Status: DC

## 2019-01-02 MED ORDER — MAGNESIUM HYDROXIDE 400 MG/5ML PO SUSP: 15.0000 mL | Freq: Every day | ORAL | Status: DC | PRN

## 2019-01-02 MED ORDER — HALOPERIDOL 5 MG PO TABS
10.0000 mg | ORAL_TABLET | Freq: Every day | ORAL | Status: DC
  Administered 2019-01-02 – 2019-01-04 (×3): 10 mg via ORAL
  Filled 2019-01-02 (×4): qty 2

## 2019-01-02 MED ORDER — TRAZODONE HCL 100 MG PO TABS
100.0000 mg | ORAL_TABLET | Freq: Every evening | ORAL | Status: DC | PRN
  Administered 2019-01-05 – 2019-01-22 (×9): 100 mg via ORAL
  Filled 2019-01-02 (×10): qty 1

## 2019-01-02 NOTE — [facility] Suicide Risk Assessment (Deleted)
[facility]   Nursing information obtained from:    Demographic factors:    Current Mental Status:    Loss Factors:    Historical Factors:    Risk Reduction Factors:     Total Time spent with patient: 30 minutes Principal Problem: <principal problem not specified> Diagnosis:  Active Problems:   Schizoaffective disorder, bipolar type (HCC)  Subjective Data: Patient is seen and examined.  Patient is a 23 year old male with a known past psychiatric history significant for schizophrenia who [facility] emergency department on 01/01/2019 via EMS.  The mother had called EMS to transport the patient to [facility] secondary to noncompliance from his medications.  His mother stated he had been laying on the floor for the last 2 days, and was unwilling to get up.  She reported that he had not been taking his medicines over the last month.  The patient did state to the assessment staff that he had had "a bit of a breakdown".  He denied suicidal ideation in the emergency department.  He denied homicidal ideation.  He was admitted to the hospital for evaluation and stabilization.  Review of his laboratories revealed a slightly low potassium, but otherwise an essentially negative lab result.  Urine drug screen is currently pending.  His vital signs are stable with a mild tachycardia of 107, but normal blood pressure and normal temperature.  Oxygen saturation was 97% on room air.  The decision was made to admit him to the hospital for evaluation and stabilization.  Continued Clinical Symptoms:    The "Alcohol Use Disorders Identification Test", Guidelines for Use in Primary Care, Second Edition.  World Science writerHealth Organization [facility](WHO). Score between 0-7:  no or low risk or alcohol related problems. Score between 8-15:  moderate risk of alcohol related problems. Score between 16-19:  high risk of alcohol related problems. Score 20 or  above:  warrants further diagnostic evaluation for alcohol dependence and treatment.   CLINICAL FACTORS:   Schizophrenia:   Paranoid or undifferentiated type   Musculoskeletal: Strength & Muscle Tone: within normal limits Gait & Station: normal Patient leans: N/A  Psychiatric Specialty Exam: Physical Exam  Nursing note and vitals reviewed. Constitutional: He appears well-developed and well-nourished.  HENT:  Head: Normocephalic and atraumatic.  Respiratory: Effort normal.  Neurological: He is alert.    ROS  There were no vitals taken for this visit.There is no height or weight on file to calculate BMI.  General Appearance: Disheveled  Eye Contact:  Fair  Speech:  Normal Rate  Volume:  Decreased  Mood:  Anxious  Affect:  Flat  Thought Process:  Disorganized and Descriptions of Associations: Circumstantial  Orientation:  Negative  Thought Content:  Delusions  Suicidal Thoughts:  No  Homicidal Thoughts:  No  Memory:  Immediate;   Fair Recent;   Fair Remote;   Fair  Judgement:  Impaired  Insight:  Lacking  Psychomotor Activity:  Decreased  Concentration:  Concentration: Fair and Attention Span: Fair  Recall:  Poor  Fund of Knowledge:  Poor  Language:  Fair  Akathisia:  Negative  Handed:  Right  AIMS (if indicated):     Assets:  Desire for Improvement Resilience  ADL's:  Impaired  Cognition:  WNL  Sleep:         COGNITIVE FEATURES THAT CONTRIBUTE TO RISK:  Polarized thinking    SUICIDE RISK:   Minimal: No identifiable suicidal ideation.  Patients presenting with  no risk factors but with morbid ruminations; may be classified as minimal risk based on the severity of the depressive symptoms  PLAN OF CARE: Patient is seen and examined.  Patient is a 23 year old male with a past psychiatric history significant for schizophrenia.  He is readmitted to the hospital secondary to noncompliance with his medications.  Review of the electronic medical record revealed that  his last 2 admissions at the hospital (08/03/2018 and 05/19/2018) resulted in the patient being discharged on clozapine, Depakote, fluvoxamine, Haldol, metformin, metoprolol and trazodone.  A Clozaril level is already been ordered, but considering that the patient most likely has been noncompliant with his medications for the last month I will restart his clozapine at 50 mg p.o. twice daily, and we will titrate that throughout the course the hospitalization.  His haloperidol, Luvox, Depakote, metformin and metoprolol as well as trazodone will be restarted at their previous dosages.  Hopefully we can get him back to baseline rather quickly.  I certify that inpatient services furnished can reasonably be expected to improve the patient's condition.   Sharma Covert, MD 01/02/2019, 1:45 PM

## 2019-01-02 NOTE — Tx Team (Signed)
Initial Treatment Plan 01/02/2019 3:31 PM [name] [name] IZT:[id number]    PATIENT STRESSORS: Financial difficulties Medication change or noncompliance   PATIENT STRENGTHS: Motivation for treatment/growth Supportive family/friends   PATIENT IDENTIFIED PROBLEMS: Medication Non-Compliance  Schizophrenia                   DISCHARGE CRITERIA:  Improved stabilization in mood, thinking, and/or behavior Verbal commitment to aftercare and medication compliance  PRELIMINARY DISCHARGE PLAN: Attend PHP/IOP Outpatient therapy  PATIENT/FAMILY INVOLVEMENT: This treatment plan has been presented to and reviewed with the patient, [name] [name], and/or family member.  The patient and family have been given the opportunity to ask questions and make suggestions.  [name] Rings Akirra Lacerda, RN 01/02/2019, 3:31 PM

## 2019-01-02 NOTE — BHH Suicide Risk Assessment (Signed)
[facility] Admission Suicide Risk Assessment   Nursing information obtained from:    [facility]:    Current Mental Status:    Loss Factors:    Historical Factors:    Risk Reduction Factors:     Total Time spent with patient: 30 minutes Principal Problem: <principal problem not specified> Diagnosis:  Active Problems:   * No active hospital problems. *  Subjective Data: Patient is seen and examined.  Patient is a 23 year old male with a known past psychiatric history significant for schizophrenia who [facility] department on 01/01/2019 via EMS.  The mother had called EMS to transport the patient to [facility] secondary to noncompliance from his medications.  His mother stated he had been laying on the floor for the last 2 days, and was unwilling to get up.  She reported that he had not been taking his medicines over the last month.  The patient did state to the assessment staff that he had had "a bit of a breakdown".  He denied suicidal ideation in the emergency department.  He denied homicidal ideation.  He was admitted to the hospital for evaluation and stabilization.  Review of his laboratories revealed a slightly low potassium, but otherwise an essentially negative lab result.  Urine drug screen is currently pending.  His vital signs are stable with a mild tachycardia of 107, but normal blood pressure and normal temperature.  Oxygen saturation was 97% on room air.  The decision was made to admit him to the hospital for evaluation and stabilization.  Continued Clinical Symptoms:    The "Alcohol Use Disorders Identification Test", Guidelines for Use in Primary Care, Second Edition.  World Science writerHealth Organization [facility](WHO). Score between 0-7:  no or low risk or alcohol related problems. Score between 8-15:  moderate risk of alcohol related problems. Score between 16-19:  high risk of alcohol related problems. Score 20 or above:  warrants  further diagnostic evaluation for alcohol dependence and treatment.   CLINICAL FACTORS:   Schizophrenia:   Paranoid or undifferentiated type   Musculoskeletal: Strength & Muscle Tone: within normal limits Gait & Station: normal Patient leans: N/A  Psychiatric Specialty Exam: Physical Exam  Nursing note and vitals reviewed. Constitutional: He appears well-developed and well-nourished.  HENT:  Head: Normocephalic and atraumatic.  Respiratory: Effort normal.  Neurological: He is alert.    ROS  Blood pressure 136/78, pulse (!) 107, temperature 98 F (36.7 C), temperature source Oral, resp. rate 16, SpO2 97 %.There is no height or weight on file to calculate BMI.  General Appearance: Disheveled  Eye Contact:  Fair  Speech:  Normal Rate  Volume:  Decreased  Mood:  Anxious  Affect:  Flat  Thought Process:  Disorganized and Descriptions of Associations: Circumstantial  Orientation:  Negative  Thought Content:  Delusions  Suicidal Thoughts:  No  Homicidal Thoughts:  No  Memory:  Immediate;   Poor Recent;   Poor Remote;   Poor  Judgement:  Impaired  Insight:  Lacking  Psychomotor Activity:  Decreased  Concentration:  Concentration: Fair and Attention Span: Fair  Recall:  Poor  Fund of Knowledge:  Poor  Language:  Fair  Akathisia:  Negative  Handed:  Right  AIMS (if indicated):     Assets:  Desire for Improvement Resilience  ADL's:  Impaired  Cognition:  WNL  Sleep:         COGNITIVE FEATURES THAT CONTRIBUTE TO RISK:  Polarized thinking  SUICIDE RISK:   Minimal: No identifiable suicidal ideation.  Patients presenting with no risk factors but with morbid ruminations; may be classified as minimal risk based on the severity of the depressive symptoms  PLAN OF CARE: Patient is seen and examined.  Patient is a 23 year old male with a past psychiatric history significant for schizophrenia.  He is readmitted to the hospital secondary to noncompliance with his  medications.  Review of the electronic medical record revealed that his last 2 admissions at the hospital (08/03/2018 and 05/19/2018) resulted in the patient being discharged on clozapine, Depakote, fluvoxamine, Haldol, metformin, metoprolol and trazodone.  A Clozaril level is already been ordered, but considering that the patient most likely has been noncompliant with his medications for the last month I will restart his clozapine at 50 mg p.o. twice daily, and we will titrate that throughout the course the hospitalization.  His haloperidol, Luvox, Depakote, metformin and metoprolol as well as trazodone will be restarted at their previous dosages.  Hopefully we can get him back to baseline rather quickly.  I certify that inpatient services furnished can reasonably be expected to improve the patient's condition.   Sharma Covert, MD 01/02/2019, 12:58 PM

## 2019-01-02 NOTE — ED Notes (Signed)
Pt given breakfast tray

## 2019-01-02 NOTE — BH Assessment (Signed)
Patient is to be admitted to [facility] by Dr. Philomena Course.  Attending Physician will be Dr. Weber Cooks.   Patient has been assigned to room 304, by [place].   Intake Paper Work has been signed and placed on patient chart.  ER staff is aware of the admission:  Ronnie, ER Secretary    Dr. Jacqualine Code, ER MD   Berton Lan, Patient's Nurse   Marius Ditch., Patient Access.

## 2019-01-02 NOTE — Progress Notes (Signed)
23 year old male admitted to unit. Denies SI/HI but endorses some auditory non-commanding hallucinations. Patient reports that current stressors are being at odds with his mom for admitting him to the hospital. Oriented patient to room and unit. Skin and contraband search completed and witnessed by Briant Cedar, Therapist, sports. Skin otherwise warm, dry and intact. No contraband found on patient nor his belongings. Admission assessment completed, fluid and nutrition offered. Patient remains safe on the unit with q 15 minute checks.

## 2019-01-02 NOTE — BH Assessment (Signed)
Assessment Note  [name] [name] is an 23 y.o. male who presented to the ED via EMS with with a previous diagnosis of schizophrena and a report of not taking prescribed medications regularly.   TTS assessment was completed via tele.   Upon assessment, patient was lying in bed with head and body covered with a blanket.  Patient would periodically uncover his head/face when speaking.  Patient reports being at the hospital because he "had a bit of a breakdown." Currently, the patient describes the mental break down as feeling "odd" and "it doesn't feel good", things are "decreased."  Patient reports this has happened before, and he was hospitalized here, at the same hospital, last year.  He denies having a current psychiatrist and reports he might have a therapist, but he isn't sure of their name.  Patient initially stated he was not prescribed any medications but then said he was.  Patient is unable to identify the medications but states they "prevent me from having a mental breakdown."   Patient denies SI and states "I'm not that type of person".  He denies HI or violence.  He denies AVH at this time or historically, despite medical records documenting experience pf psychosis/diagnoses consistent with psychosis. Patient describes his appetite as "good" and his sleep as "good." He is a high school graduate.  He denies receiving SSI. He reports no employment. He denies alcohol or other drug uses.  Patient denies pending charges. For fun, the patient reports that he will "hang out with myself at home." He does not have many friends.  He lives with his mother and has not lived with anyone else.  When asked what he would like the outcome of his visit today be, he said "I want to leave - and go back to my house."  Patient gave verbal permission for TTS to contact his mother, Uimah/[phone].  TTS attempted to reach her by phone at 09:25, yet, she was unavailable.    Diagnosis: Schizophrenia, by history.    Past Medical History:  Past Medical History:  Diagnosis Date  . Asthma   . Depression   . Psychosis [facility])     Past Surgical History:  Procedure Laterality Date  . BACK SURGERY      Family History: No family history on file.  Social History:  reports that he has never smoked. He has never used smokeless tobacco. He reports current drug use. Drug: Marijuana. He reports that he does not drink alcohol.  Additional Social History:  Alcohol / Drug Use Pain Medications: See PTA Prescriptions: See PTA Over the Counter: See PTA History of alcohol / drug use?: No history of alcohol / drug abuse  CIWA: CIWA-Ar BP: 136/78 Pulse Rate: (!) 107 COWS:    Allergies: No Known Allergies  Home Medications: (Not in a hospital admission)   OB/GYN Status:  No LMP for male patient.  General Assessment Data Location of Assessment: [facility] ED TTS Assessment: In system Is this a Tele or Face-to-Face Assessment?: Tele Assessment Is this an Initial Assessment or a Re-assessment for this encounter?: Initial Assessment Patient Accompanied by:: N/A Language Other than English: No Living Arrangements: Other (Comment)(with mother) What gender do you identify as?: Male Marital status: Single Pregnancy Status: No Living Arrangements: Parent, Other relatives Can pt return to current living arrangement?: Yes Admission Status: Involuntary Is patient capable of signing voluntary admission?: No Referral Source: Self/Family/Friend Insurance type: Medicaid  Medical Screening Exam ([place]) Medical Exam completed: Yes  Crisis  Care Plan Living Arrangements: Parent, Other relatives Name of Psychiatrist: unknown Name of Therapist: unknown  Education Status Is patient currently in school?: No Is the patient employed, unemployed or receiving disability?: Unemployed  Risk to self with the past 6 months Suicidal Ideation: No Has patient been a risk to self within the past 6 months prior to  admission? : No Suicidal Intent: No Has patient had any suicidal intent within the past 6 months prior to admission? : No Is patient at risk for suicide?: No Suicidal Plan?: No Has patient had any suicidal plan within the past 6 months prior to admission? : No Access to Means: No What has been your use of drugs/alcohol within the last 12 months?: denies Previous Attempts/Gestures: No Other Self Harm Risks: none noted Triggers for Past Attempts: None known Intentional Self Injurious Behavior: None Family Suicide History: No Persecutory voices/beliefs?: No Depression: Yes Depression Symptoms: Isolating, Loss of interest in usual pleasures Substance abuse history and/or treatment for substance abuse?: No Suicide prevention information given to non-admitted patients: Not applicable  Risk to Others within the past 6 months Homicidal Ideation: No Does patient have any lifetime risk of violence toward others beyond the six months prior to admission? : No Thoughts of Harm to Others: No Current Homicidal Intent: No Current Homicidal Plan: No Access to Homicidal Means: No History of harm to others?: No Assessment of Violence: None Noted Violent Behavior Description: none noted Does patient have access to weapons?: No Criminal Charges Pending?: No Does patient have a court date: No Is patient on probation?: No  Psychosis Hallucinations: None noted Delusions: None noted  Mental Status Report Appearance/Hygiene: In scrubs, Unable to Assess Eye Contact: Poor Motor Activity: Freedom of movement, Unremarkable Speech: Logical/coherent Level of Consciousness: Quiet/awake, Alert, Irritable Mood: Anhedonia Affect: Constricted Anxiety Level: Minimal Thought Processes: Coherent, Relevant Judgement: Unable to Assess Orientation: Person, Place, Time, Situation, Appropriate for developmental age Obsessive Compulsive Thoughts/Behaviors: None  Cognitive Functioning Concentration:  Fair Memory: Unable to Assess Is patient IDD: No Insight: Fair Impulse Control: Fair Appetite: Good Have you had any weight changes? : No Change Sleep: No Change Total Hours of Sleep: 8 Vegetative Symptoms: Unable to Assess  ADLScreening [facility] Assessment Services) Patient's cognitive ability adequate to safely complete daily activities?: Yes Patient able to express need for assistance with ADLs?: Yes Independently performs ADLs?: Yes (appropriate for developmental age)  Prior Inpatient Therapy Prior Inpatient Therapy: Yes Prior Therapy Dates: 2019 Prior [facility] Reason for Treatment: altered mental status  Prior Outpatient Therapy Prior Outpatient Therapy: Yes Does patient have an ACCT team?: No Does patient have Intensive In-House Services?  : No Does patient have [facility] services? : No  ADL Screening (condition at time of admission) Patient's cognitive ability adequate to safely complete daily activities?: Yes Is the patient deaf or have difficulty hearing?: No Does the patient have difficulty seeing, even when wearing glasses/contacts?: No Does the patient have difficulty concentrating, remembering, or making decisions?: No Patient able to express need for assistance with ADLs?: Yes Does the patient have difficulty dressing or bathing?: No Independently performs ADLs?: Yes (appropriate for developmental age) Does the patient have difficulty walking or climbing stairs?: No Weakness of Legs: None Weakness of Arms/Hands: None  Home Assistive Devices/Equipment Home Assistive Devices/Equipment: None  Therapy Consults (therapy consults require a physician order) PT Evaluation Needed: No OT Evalulation Needed: No SLP Evaluation Needed: No Abuse/Neglect Assessment (Assessment to be complete while patient is alone) Abuse/Neglect Assessment Can Be Completed:  Yes Physical Abuse: Denies Verbal Abuse: Denies Sexual Abuse: Denies Exploitation of  patient/patient's resources: Denies Self-Neglect: Denies Values / Beliefs Cultural Requests During Hospitalization: None Spiritual Requests During Hospitalization: None Consults Spiritual Care Consult Needed: No Social Work Consult Needed: No Merchant navy officerAdvance Directives (For Healthcare) Does Patient Have a Medical Advance Directive?: No Would patient like information on creating a medical advance directive?: No - Patient declined          Disposition:  Disposition Initial Assessment Completed for this Encounter: Yes Patient referred to: Other (Comment)(pending )  On Site Evaluation: pending psychiatric provider assessment  Demetrios IsaacsLatasha Y Hicks [facility] 01/02/2019 9:27 AM

## 2019-01-02 NOTE — H&P (Signed)
Psychiatric Admission Assessment Adult  Patient Identification: SKYLAR PRIEST MRN:  [id number] Date of Evaluation:  01/02/2019 Chief Complaint:  Schizoaffective disorder Principal Diagnosis: <principal problem not specified> Diagnosis:  Active Problems:   Schizoaffective disorder, bipolar type ([place])  History of Present Illness: Patient is seen and examined.  Patient is a 23 year old male with a known past psychiatric history significant for schizophrenia who presented to the [facility] emergency department on 01/01/2019 via EMS.  The mother had called EMS to transport the patient to [facility] secondary to noncompliance from his medications.  His mother stated he had been laying on the floor for the last 2 days, and was unwilling to get up.  She reported that he had not been taking his medicines over the last month.  The patient did state to the assessment staff that he had had "a bit of a breakdown".  He denied suicidal ideation in the emergency department.  He denied homicidal ideation.  He was admitted to the hospital for evaluation and stabilization.  Review of his laboratories revealed a slightly low potassium, but otherwise an essentially negative lab result.  Urine drug screen is currently pending.  His vital signs are stable with a mild tachycardia of 107, but normal blood pressure and normal temperature.  Oxygen saturation was 97% on room air.  The decision was made to admit him to the hospital for evaluation and stabilization. Associated Signs/Symptoms: Depression Symptoms:  anhedonia, insomnia, psychomotor retardation, fatigue, feelings of worthlessness/guilt, difficulty concentrating, disturbed sleep, (Hypo) Manic Symptoms:  Impulsivity, Irritable Mood, Labiality of Mood, Anxiety Symptoms:  Obsessive Compulsive Symptoms:   Checking,, Psychotic Symptoms:  Delusions, Paranoia, PTSD Symptoms: Negative Total Time spent with patient: 30  minutes  Past Psychiatric History: Patient has several psychiatric admissions.  His last admission to this facility was on 08/03/2018.  He had also been previously admitted here on 05/19/2018.  Is the patient at risk to self? No.  Has the patient been a risk to self in the past 6 months? No.  Has the patient been a risk to self within the distant past? No.  Is the patient a risk to others? Yes.    Has the patient been a risk to others in the past 6 months? No.  Has the patient been a risk to others within the distant past? No.   Prior Inpatient Therapy:   Prior Outpatient Therapy:    Alcohol Screening:   Substance Abuse History in the last 12 months:  No. Consequences of Substance Abuse: Negative Previous Psychotropic Medications: Yes  Psychological Evaluations: Yes  Past Medical History:  Past Medical History:  Diagnosis Date  . Asthma   . Depression   . Psychosis [facility])     Past Surgical History:  Procedure Laterality Date  . BACK SURGERY     Family History: No family history on file. Family Psychiatric  History: Denied Tobacco Screening:   Social History:  Social History   Substance and Sexual Activity  Alcohol Use No     Social History   Substance and Sexual Activity  Drug Use Yes  . Types: Marijuana    Additional Social History:                           Allergies:  No Known Allergies Lab Results:  Results for orders placed or performed during the hospital encounter of 01/01/19 (from the past 48 hour(s))  Acetaminophen level  Status: Abnormal   Collection Time: 01/01/19  6:47 PM  Result Value Ref Range   Acetaminophen (Tylenol), Serum <10 (L) 10 - 30 ug/mL    Comment: (NOTE) Therapeutic concentrations vary significantly. A range of 10-30 ug/mL  may be an effective concentration for many patients. However, some  are best treated at concentrations outside of this range. Acetaminophen concentrations >150 ug/mL at 4 hours after ingestion  and  >50 ug/mL at 12 hours after ingestion are often associated with  toxic reactions. Performed at [facility], [address] [place], [place] [address]   Comprehensive metabolic panel     Status: Abnormal   Collection Time: 01/01/19  6:47 PM  Result Value Ref Range   Sodium 137 135 - 145 mmol/L   Potassium 3.4 (L) 3.5 - 5.1 mmol/L   Chloride 102 98 - 111 mmol/L   CO2 19 (L) 22 - 32 mmol/L   Glucose, Bld 84 70 - 99 mg/dL   BUN 12 6 - 20 mg/dL   Creatinine, Ser 6.041.00 0.61 - 1.24 mg/dL   Calcium 9.3 8.9 - 54.010.3 mg/dL   Total Protein 7.6 6.5 - 8.1 g/dL   Albumin 4.8 3.5 - 5.0 g/dL   AST 26 15 - 41 U/L   ALT 37 0 - 44 U/L   Alkaline Phosphatase 103 38 - 126 U/L   Total Bilirubin 2.4 (H) 0.3 - 1.2 mg/dL   GFR calc non Af Amer >60 >60 mL/min   GFR calc Af Amer >60 >60 mL/min   Anion gap 16 (H) 5 - 15    Comment: Performed at [facility], [address] [place], [place] [address]  Ethanol     Status: None   Collection Time: 01/01/19  6:47 PM  Result Value Ref Range   Alcohol, Ethyl (B) <10 <10 mg/dL    Comment: (NOTE) Lowest detectable limit for serum alcohol is 10 mg/dL. For medical purposes only. Performed at [facility], [address] [place], [place] [address]   Salicylate level     Status: None   Collection Time: 01/01/19  6:47 PM  Result Value Ref Range   Salicylate Lvl <7.0 2.8 - 30.0 mg/dL    Comment: Performed at [facility], [address] [place], [place] [address]  CBC with Differential     Status: Abnormal   Collection Time: 01/01/19  6:47 PM  Result Value Ref Range   WBC 5.1 4.0 - 10.5 K/uL   RBC 5.56 4.22 - 5.81 MIL/uL   Hemoglobin 15.6 13.0 - 17.0 g/dL   HCT 30.843.7 65.739.0 - 84.652.0 %   MCV 78.6 (L) 80.0 - 100.0 fL   MCH 28.1 26.0 - 34.0 pg   MCHC 35.7 30.0 - 36.0 g/dL   RDW 96.212.7 95.211.5 - 84.115.5 %   Platelets 244 150 - 400 K/uL   nRBC 0.0 0.0 - 0.2 %   Neutrophils Relative % 63 %   Neutro Abs 3.2 1.7 - 7.7 K/uL    Lymphocytes Relative 27 %   Lymphs Abs 1.4 0.7 - 4.0 K/uL   Monocytes Relative 10 %   Monocytes Absolute 0.5 0.1 - 1.0 K/uL   Eosinophils Relative 0 %   Eosinophils Absolute 0.0 0.0 - 0.5 K/uL   Basophils Relative 0 %   Basophils Absolute 0.0 0.0 - 0.1 K/uL   Immature Granulocytes 0 %   Abs Immature Granulocytes 0.02 0.00 - 0.07 K/uL    Comment: Performed at [facility],  [address], [place], [place] [address]  Lipase, blood     Status: None   Collection Time: 01/01/19  6:47 PM  Result Value Ref Range   Lipase 20 11 - 51 U/L    Comment: Performed at [facility], [address] [place], [place] [address]  CK     Status: Abnormal   Collection Time: 01/01/19  6:47 PM  Result Value Ref Range   Total CK 505 (H) 49 - 397 U/L    Comment: Performed at [facility], [address], [place], [place] [address]  SARS Coronavirus 2 (CEPHEID - Performed in [facility] hospital lab), Hosp Order     Status: None   Collection Time: 01/02/19 11:27 AM   Specimen: Nasopharyngeal Swab  Result Value Ref Range   SARS Coronavirus 2 NEGATIVE NEGATIVE    Comment: (NOTE) If result is NEGATIVE SARS-CoV-2 target nucleic acids are NOT DETECTED. The SARS-CoV-2 RNA is generally detectable in upper and lower  respiratory specimens during the acute phase of infection. The lowest  concentration of SARS-CoV-2 viral copies this assay can detect is 250  copies / mL. A negative result does not preclude SARS-CoV-2 infection  and should not be used as the sole basis for treatment or other  patient management decisions.  A negative result may occur with  improper specimen collection / handling, submission of specimen other  than nasopharyngeal swab, presence of viral mutation(s) within the  areas targeted by this assay, and inadequate number of viral copies  (<250 copies / mL). A negative result must be combined with clinical  observations, patient history, and epidemiological  information. If result is POSITIVE SARS-CoV-2 target nucleic acids are DETECTED. The SARS-CoV-2 RNA is generally detectable in upper and lower  respiratory specimens dur ing the acute phase of infection.  Positive  results are indicative of active infection with SARS-CoV-2.  Clinical  correlation with patient history and other diagnostic information is  necessary to determine patient infection status.  Positive results do  not rule out bacterial infection or co-infection with other viruses. If result is PRESUMPTIVE POSTIVE SARS-CoV-2 nucleic acids MAY BE PRESENT.   A presumptive positive result was obtained on the submitted specimen  and confirmed on repeat testing.  While 2019 novel coronavirus  (SARS-CoV-2) nucleic acids may be present in the submitted sample  additional confirmatory testing may be necessary for epidemiological  and / or clinical management purposes  to differentiate between  SARS-CoV-2 and other Sarbecovirus currently known to infect humans.  If clinically indicated additional testing with an alternate test  methodology [id number]) is advised. The SARS-CoV-2 RNA is generally  detectable in upper and lower respiratory sp ecimens during the acute  phase of infection. The expected result is Negative. Fact Sheet for Patients:  BoilerBrush.com.cy Fact Sheet for Healthcare Providers: https://pope.com/ This test is not yet approved or cleared by the [place] FDA and has been authorized for detection and/or diagnosis of SARS-CoV-2 by FDA under an Emergency Use Authorization (EUA).  This EUA will remain in effect (meaning this test can be used) for the duration of the COVID-19 declaration under Section 564(b)(1) of the Act, 21 U.S.C. section 360bbb-3(b)(1), unless the authorization is terminated or revoked sooner. Performed at [facility], [address], [place], [place] [address]     Blood Alcohol level:   Lab Results  Component Value Date   [facility] <10 01/01/2019   ETH <10 08/02/2018    Metabolic Disorder Labs:  Lab  Results  Component Value Date   HGBA1C 5.1 08/10/2018   MPG 99.67 08/10/2018   MPG 93.93 03/08/2018   No results found for: PROLACTIN Lab Results  Component Value Date   CHOL 175 08/10/2018   TRIG 103 08/10/2018   HDL 46 08/10/2018   CHOLHDL 3.8 08/10/2018   VLDL 21 08/10/2018   LDLCALC 108 (H) 08/10/2018   LDLCALC 168 (H) 03/08/2018    Current Medications: Current Facility-Administered Medications  Medication Dose Route Frequency Provider Last Rate Last Dose  . acetaminophen (TYLENOL) tablet 650 mg  650 mg Oral Q6H PRN Antonieta Pertlary,  Lawson, MD      . alum & mag hydroxide-simeth (MAALOX/MYLANTA) 200-200-20 MG/5ML suspension 30 mL  30 mL Oral Q4H PRN Antonieta Pertlary,  Lawson, MD      . cloZAPine (CLOZARIL) tablet 50 mg  50 mg Oral BID Antonieta Pertlary,  Lawson, MD      . divalproex (DEPAKOTE) DR tablet 500 mg  500 mg Oral Q12H Antonieta Pertlary,  Lawson, MD      . fluvoxaMINE (LUVOX) tablet 25 mg  25 mg Oral QHS Antonieta Pertlary,  Lawson, MD      . haloperidol (HALDOL) tablet 10 mg  10 mg Oral QHS Antonieta Pertlary,  Lawson, MD      . hydrOXYzine (ATARAX/VISTARIL) tablet 25 mg  25 mg Oral Q6H PRN Antonieta Pertlary,  Lawson, MD      . magnesium hydroxide (MILK OF MAGNESIA) suspension 15 mL  15 mL Oral Daily PRN Antonieta Pertlary,  Lawson, MD      . traZODone (DESYREL) tablet 100 mg  100 mg Oral QHS PRN Antonieta Pertlary,  Lawson, MD      . ziprasidone (GEODON) injection 20 mg  20 mg Intramuscular Q6H PRN Antonieta Pertlary,  Lawson, MD       PTA Medications: Medications Prior to Admission  Medication Sig Dispense Refill Last Dose  . benztropine (COGENTIN) 0.5 MG tablet Take 0.5 mg by mouth 2 (two) times daily.   Past Month at Unknown time  . cloZAPine (CLOZARIL) 100 MG tablet Take 3 tablets (300 mg total) by mouth at bedtime. 90 tablet 1` Past Month at Unknown time  . divalproex (DEPAKOTE) 500 MG DR tablet Take 1 tablet (500 mg total)  by mouth every 12 (twelve) hours. 60 tablet 1 Past Month at Unknown time  . fluvoxaMINE (LUVOX) 25 MG tablet Take 1 tablet (25 mg total) by mouth at bedtime. 30 tablet 1 Past Month at Unknown time  . metFORMIN (GLUCOPHAGE) 500 MG tablet Take 1 tablet (500 mg total) by mouth daily with breakfast. 30 tablet 1 Past Month at Unknown time  . metoprolol tartrate (LOPRESSOR) 25 MG tablet Take 1 tablet (25 mg total) by mouth 2 (two) times daily. 60 tablet 1 Past Month at Unknown time  . traZODone (DESYREL) 100 MG tablet Take 100 mg by mouth at bedtime.   prn at prn    Musculoskeletal: Strength & Muscle Tone: within normal limits Gait & Station: normal Patient leans: N/A  Psychiatric Specialty Exam: Physical Exam  Nursing note and vitals reviewed. Constitutional: He is oriented to person, place, and time. He appears well-developed and well-nourished.  HENT:  Head: Normocephalic and atraumatic.  Respiratory: Effort normal.  Neurological: He is alert and oriented to person, place, and time.    ROS  There were no vitals taken for this visit.There is no height or weight on file to calculate BMI.  General Appearance: Casual  Eye Contact:  Fair  Speech:  Normal Rate  Volume:  Decreased  Mood:  Anxious  Affect:  Congruent  Thought Process:  Coherent and Descriptions of Associations: Circumstantial  Orientation:  Full (Time, Place, and Person)  Thought Content:  Paranoid Ideation  Suicidal Thoughts:  No  Homicidal Thoughts:  No  Memory:  Immediate;   Fair Recent;   Fair Remote;   Fair  Judgement:  Impaired  Insight:  Lacking  Psychomotor Activity:  Decreased  Concentration:  Concentration: Fair and Attention Span: Fair  Recall:  [employer]Fair  Fund of Knowledge:  Fair  Language:  Fair  Akathisia:  Negative  Handed:  Right  AIMS (if indicated):     Assets:  Desire for Improvement Resilience  ADL's:  Impaired  Cognition:  WNL  Sleep:       Treatment Plan Summary: Daily contact with patient  to assess and evaluate symptoms and progress in treatment, Medication management and Plan :Patient is seen and examined.  Patient is a 23 year old male with a past psychiatric history significant for schizophrenia.  He is readmitted to the hospital secondary to noncompliance with his medications.  Review of the electronic medical record revealed that his last 2 admissions at the hospital (08/03/2018 and 05/19/2018) resulted in the patient being discharged on clozapine, Depakote, fluvoxamine, Haldol, metformin, metoprolol and trazodone.  A Clozaril level is already been ordered, but considering that the patient most likely has been noncompliant with his medications for the last month I will restart his clozapine at 50 mg p.o. twice daily, and we will titrate that throughout the course the hospitalization.  His haloperidol, Luvox, Depakote, metformin and metoprolol as well as trazodone will be restarted at their previous dosages.  Hopefully we can get him back to baseline rather quickly.  Observation Level/Precautions:  15 minute checks  Laboratory:  Chemistry Profile  Psychotherapy:    Medications:    Consultations:    Discharge Concerns:    Estimated LOS:  Other:     Physician Treatment Plan for Primary Diagnosis: <principal problem not specified> Long Term Goal(s): Improvement in symptoms so as ready for discharge  Short Term Goals: Ability to identify changes in lifestyle to reduce recurrence of condition will improve, Ability to verbalize feelings will improve, Ability to demonstrate self-control will improve, Ability to identify and develop effective coping behaviors will improve, Ability to maintain clinical measurements within normal limits will improve and Compliance with prescribed medications will improve  Physician Treatment Plan for Secondary Diagnosis: Active Problems:   Schizoaffective disorder, bipolar type ([facility])  Long Term Goal(s): Improvement in symptoms so as ready for  discharge  Short Term Goals: Ability to identify changes in lifestyle to reduce recurrence of condition will improve, Ability to verbalize feelings will improve, Ability to demonstrate self-control will improve, Ability to identify and develop effective coping behaviors will improve, Ability to maintain clinical measurements within normal limits will improve and Compliance with prescribed medications will improve  I certify that inpatient services furnished can reasonably be expected to improve the patient's condition.    Antonieta PertGreg Lawson , MD 6/13/20201:40 PM

## 2019-01-02 NOTE — Plan of Care (Signed)
Patient newly admitted to the unit. Goals not yet reached.

## 2019-01-02 NOTE — Progress Notes (Signed)
Clozapine monitoring Consult   23 yo male ordered clozapine 50 mg PO BID  6/12 [facility] 3200  Information entered into Clozapine registry and pt eligible to receive clozapine Next labs due in a week - ordered for 01/08/19 Pharmacy will continue to follow.   Prudy Feeler, RPh 01/02/2019 1:33 PM

## 2019-01-02 NOTE — BH Assessment (Signed)
TTS is available for tele-assessment. [phone].

## 2019-01-03 MED ORDER — CLOZAPINE 100 MG PO TABS: 100.0000 mg | ORAL_TABLET | Freq: Every day | ORAL | Status: DC

## 2019-01-03 NOTE — BHH Group Notes (Signed)
LCSW Group Therapy Note 01/03/2019 1:15pm  Type of Therapy and Topic: Group Therapy: Feelings Around Returning Home & Establishing a Supportive Framework and Supporting Oneself When Supports Not Available  Participation Level: Did Not Attend  Description of Group:  Patients first processed thoughts and feelings about upcoming discharge. These included fears of upcoming changes, lack of change, new living environments, judgements and expectations from others and overall stigma of mental health issues. The group then discussed the definition of a supportive framework, what that looks and feels like, and how do to discern it from an unhealthy non-supportive network. The group identified different types of supports as well as what to do when your family/friends are less than helpful or unavailable  Therapeutic Goals  1. Patient will identify one healthy supportive network that they can use at discharge. 2. Patient will identify one factor of a supportive framework and how to tell it from an unhealthy network. 3. Patient able to identify one coping skill to use when they do not have positive supports from others. 4. Patient will demonstrate ability to communicate their needs through discussion and/or role plays.  Summary of Patient Progress:  Pt was invited to attend group but chose not to attend. CSW will continue to encourage pt to attend group throughout their admission.   Therapeutic Modalities Cognitive Behavioral Therapy Motivational Interviewing   [name] [name]  CUEBAS-COLON, LCSW 01/03/2019 11:43 AM

## 2019-01-03 NOTE — Progress Notes (Signed)
D: Patient has been isolative to room. Mood is pleasant. Affect is guarded. Denies SI, HI and AV hallucinations. Contracts for safety. Voices no complaints. Medication compliant. A: Continue to monitor for safety. R: Safety maintained.

## 2019-01-03 NOTE — Plan of Care (Signed)
  Problem: Activity: Goal: Will verbalize the importance of balancing activity with adequate rest periods Outcome: Progressing   D: Patient has been isolative to room. Mood is pleasant. Affect is guarded. Denies SI, HI and AV hallucinations. Contracts for safety. Voices no complaints. Medication compliant. A: Continue to monitor for safety. R: Safety maintained.

## 2019-01-03 NOTE — Plan of Care (Signed)
D- Patient alert and oriented. Patient presents in a pleasant mood on assessment stating that he slept good last night and had no major complaints or concerns to voice to this Probation officer. Patient denies any signs/symptoms of depression/anxiety. Patient also  denies SI, HI, AVH, and pain at this time. Patient had no stated goals for today.  A- Scheduled medications administered to patient, per MD orders. Support and encouragement provided.  Routine safety checks conducted every 15 minutes.  Patient informed to notify staff with problems or concerns.  R- No adverse drug reactions noted. Patient contracts for safety at this time. Patient compliant with medications and treatment plan. Patient receptive, calm, and cooperative. Patient interacts well with others on the unit.  Patient remains safe at this time.  Problem: Activity: Goal: Will verbalize the importance of balancing activity with adequate rest periods Outcome: Progressing   Problem: Education: Goal: Will be free of psychotic symptoms Outcome: Progressing Goal: Knowledge of the prescribed therapeutic regimen will improve Outcome: Progressing   Problem: Coping: Goal: Coping ability will improve Outcome: Progressing Goal: Will verbalize feelings Outcome: Progressing   Problem: Health Behavior/Discharge Planning: Goal: Compliance with prescribed medication regimen will improve Outcome: Progressing   Problem: Nutritional: Goal: Ability to achieve adequate nutritional intake will improve Outcome: Progressing   Problem: Role Relationship: Goal: Ability to communicate needs accurately will improve Outcome: Progressing Goal: Ability to interact with others will improve Outcome: Progressing   Problem: Safety: Goal: Ability to redirect hostility and anger into socially appropriate behaviors will improve Outcome: Progressing Goal: Ability to remain free from injury will improve Outcome: Progressing   Problem: Self-Care: Goal: Ability  to participate in self-care as condition permits will improve Outcome: Progressing   Problem: Self-Concept: Goal: Will verbalize positive feelings about self Outcome: Progressing

## 2019-01-03 NOTE — B[facility] Counselor (Signed)
Adult Comprehensive Assessment  Patient ID: [name] [name], male   DOB: 1996/05/21, 21 y.o.   MRN: [id number]  Information Source: Information source: Patient, chart review  Current Stressors: Patient states theiEmeline Ginsr primary concerns and needs for treatment are:: "I had a mental breakdown" Patient states their goals for this hospitalization and ongoing recovery are:: "to get back on track" Educational / Learning stressors: None reported Employment / Job issues: Unemployed Family Relationships: Pt lives with his mother and brother Surveyor, quantityinancial / Lack of resources (include bankruptcy): Pt relies on his mother for financial support. Housing / Lack of housing: Stable housing Physical health (include injuries & life threatening diseases): None reported Social relationships: No response Substance abuse: Pt denies Bereavement / Loss: None reported  Living/Environment/Situation: Living Arrangements: Mother Living conditions (as described by patient or guardian): "It's okay" Who else lives in the home?: pt's mother and brother How long has patient lived in current situation?: "20 years" What is atmosphere in current home: None reported  Family History: Marital status: Single Are you sexually active?: No What is your sexual orientation?: Heterosexual Has your sexual activity been affected by drugs, alcohol, medication, or emotional stress?: No Does patient have children?: No  Childhood History: By whom was/is the patient raised?: Mother Additional childhood history information: pt's family resides in [place]. Pt mother and brother are only relatives who live in the [place]. Description of patient's relationship with caregiver when they were a child: Pt states "I get along with my mother" Patient's description of current relationship with people who raised him/her: "I get along with my mother" How were you disciplined when you got in trouble as a child/adolescent?: Punished Does  patient have siblings?: Yes Number of Siblings: 1 Description of patient's current relationship with siblings: Pt has a younger brother.   Did patient suffer any verbal/emotional/physical/sexual abuse as a child?: No Did patient suffer from severe childhood neglect?: No Has patient ever been sexually abused/assaulted/raped as an adolescent or adult?: No Was the patient ever a victim of a crime or a disaster?: No Witnessed domestic violence?: No Has patient been effected by domestic violence as an adult?: No  Education: Highest grade of school patient has completed: 12th Currently a student?: Yes, pt says he attends [employer], wants to major in architecture Learning disability?: No  Employment/Work Situation: Employment situation: Unemployed, pt says he does not receive disability. Patient's job has been impacted by current illness: (n/a) What is the longest time patient has a held a job?: 1 year Where was the patient employed at that time?: "I don't remember" Did You Receive Any Psychiatric Treatment/Services While in the Military?: (n/a) Are There Guns or Other Weapons in Your Home?: No Are These Weapons Safely Secured?: Pt denies access to guns or Horticulturist, commercialweapons  Financial Resources: Financial resources: Support from parents / caregiver, Medicaid Does patient have a Lawyerrepresentative payee or guardian?: No  Alcohol/Substance Abuse: What has been your use of drugs/alcohol within the last 12 months?: Pt denies any drug or alcohol use If attempted suicide, did drugs/alcohol play a role in this?: No Alcohol/Substance Abuse Treatment Hx: Denies past history If yes, describe treatment: n/a Has alcohol/substance abuse ever caused legal problems?: No  Social Support System: Conservation officer, natureatient's Community Support System: Fair Museum/gallery exhibitions officerDescribe Community Support System:  mother.  Type of faith/religion: Ephriam KnucklesChristian How does patient's faith help to cope with current illness?: None  reported  Leisure/Recreation: Leisure and Hobbies: "I'm not sure" Strengths/Needs: What is the patient's perception of their strengths?: No response Patient  states they can use these personal strengths during their treatment to contribute to their recovery: "I'm not sure" Patient states these barriers may affect/interfere with their treatment: None reported Patient states these barriers may affect their return to the community: None reported Other important information patient would like considered in planning for their treatment: None reported  Discharge Plan: Currently receiving community mental health services: No response, however pt previously received ACTT from [facility]. Patient states concerns and preferences for aftercare planning are: None reported Patient states they will know when they are safe and ready for discharge when: "Once I'm healthy" Does patient have access to transportation?: Yes Does patient have financial barriers related to discharge medications?: No Patient description of barriers related to discharge medications: None reported Will patient be returning to same living situation after discharge?: Yes    Summary/Recommendations:  Patient is a 23 year old male admitted involuntarily and diagnosed with Schizoaffective Disorder.  Patient with a known past psychiatric history significant for schizophrenia who presented to the [facility] emergency department on 01/01/2019 via EMS.  The mother had called EMS to transport the patient to [facility] secondary to noncompliance from his medications.  His mother stated he had been laying on the floor for the last 2 days and was unwilling to get up. The patient did state to the assessment staff that he had had "a bit of a breakdown." Patient will benefit from crisis stabilization, medication evaluation, group therapy and psychoeducation. In addition to case management for discharge  planning. At discharge it is recommended that patient adhere to the established discharge plan and continue treatment.     Arlys Scatena  CUEBAS-COLON. 01/03/2019

## 2019-01-03 NOTE — B[facility] Suicide Risk Assessment (Signed)
[place] INPATIENT:  Family/Significant Other Suicide Prevention Education  Suicide Prevention Education:  Patient Refusal for Family/Significant Other Suicide Prevention Education: The patient [name] [name] has refused to provide written consent for family/significant other to be provided Family/Significant Other Suicide Prevention Education during admission and/or prior to discharge.  Physician notified.  [name]  CUEBAS-COLON 01/03/2019, 3:42 PM

## 2019-01-03 NOTE — Progress Notes (Signed)
[facility]  01/03/2019 8:51 AM [name] [name]  MRN:  [id number] Subjective: Patient is a [age] year old male with a known past psychiatric history significant for schizophrenia who [facility] department on 01/01/2019 via EMS.  The mother had called EMS to transport the patient to [facility] secondary to noncompliance from his medications.  His mother stated he had been laying on the floor for the last 2 days, and was unwilling to get up.  She reported that he had not been taking his medicines over the last month.  The patient did state to the assessment staff that he had had "a bit of a breakdown".   Objective: Patient is seen and examined.  Patient is a 23 year old male with a past psychiatric history significant for schizophrenia who is seen in follow-up.  He is essentially unchanged from yesterday.  He is laying in the bed this morning, and has the blankets pulled up over half his face.  He does speak.  He denied any auditory or visual hallucinations.  He denied any suicidal ideation.  He does appear to remain guarded and isolated.  His Depakote level came back, and it was less than 10.  Clearly he had been noncompliant with his medications for some time.  We discussed changing his Clozaril back to bedtime only.  He received it twice daily yesterday to get him back headed in the right direction.  His vital signs are stable.  He slept 6.75 hours last night.  He is afebrile.   Principal Problem: <principal problem not specified> Diagnosis: Active Problems:   Schizoaffective disorder, bipolar type (HCC)  Total Time spent with patient: 15 minutes  Past Psychiatric History: See admission H&P  Past Medical History:  Past Medical History:  Diagnosis Date  . Asthma   . Depression   . Psychosis [facility](HCC)     Past Surgical History:  Procedure Laterality Date  . BACK SURGERY     Family History: History reviewed. No pertinent  family history. Family Psychiatric  History: See admission H&P Social History:  Social History   Substance and Sexual Activity  Alcohol Use No     Social History   Substance and Sexual Activity  Drug Use Yes  . Types: Marijuana    Social History   Socioeconomic History  . Marital status: Single    Spouse name: Not on file  . Number of children: Not on file  . Years of education: Not on file  . Highest education level: Not on file  Occupational History  . Not on file  Social Needs  . Financial resource strain: Not on file  . Food insecurity    Worry: Not on file    Inability: Not on file  . Transportation needs    Medical: Not on file    Non-medical: Not on file  Tobacco Use  . Smoking status: Never Smoker  . Smokeless tobacco: Never Used  Substance and Sexual Activity  . Alcohol use: No  . Drug use: Yes    Types: Marijuana  . Sexual activity: Not Currently  Lifestyle  . Physical activity    Days per week: Not on file    Minutes per session: Not on file  . Stress: Not on file  Relationships  . Social Musicianconnections    Talks on phone: Not on file    Gets together: Not on file    Attends religious service: Not on file    Active  member of club or organization: Not on file    Attends meetings of clubs or organizations: Not on file    Relationship status: Not on file  Other Topics Concern  . Not on file  Social History Narrative  . Not on file   Additional Social History:                         Sleep: Good  Appetite:  Fair  Current Medications: Current Facility-Administered Medications  Medication Dose Route Frequency Provider Last Rate Last Dose  . acetaminophen (TYLENOL) tablet 650 mg  650 mg Oral Q6H PRN Sharma Covert, MD      . alum & mag hydroxide-simeth (MAALOX/MYLANTA) 200-200-20 MG/5ML suspension 30 mL  30 mL Oral Q4H PRN Sharma Covert, MD      . cloZAPine (CLOZARIL) tablet 50 mg  50 mg Oral BID Sharma Covert, MD   50 mg  at 01/03/19 0835  . divalproex (DEPAKOTE) DR tablet 500 mg  500 mg Oral Q12H Sharma Covert, MD   500 mg at 01/03/19 0835  . fluvoxaMINE (LUVOX) tablet 25 mg  25 mg Oral QHS Sharma Covert, MD   25 mg at 01/02/19 2118  . haloperidol (HALDOL) tablet 10 mg  10 mg Oral QHS Sharma Covert, MD   10 mg at 01/02/19 2118  . hydrOXYzine (ATARAX/VISTARIL) tablet 25 mg  25 mg Oral Q6H PRN Sharma Covert, MD      . magnesium hydroxide (MILK OF MAGNESIA) suspension 15 mL  15 mL Oral Daily PRN Sharma Covert, MD      . traZODone (DESYREL) tablet 100 mg  100 mg Oral QHS PRN Sharma Covert, MD      . ziprasidone (GEODON) injection 20 mg  20 mg Intramuscular Q6H PRN Sharma Covert, MD        Lab Results:  Results for orders placed or performed during the hospital encounter of 01/02/19 (from the past 48 hour(s))  Valproic acid level     Status: Abnormal   Collection Time: 01/02/19 12:50 PM  Result Value Ref Range   Valproic Acid Lvl <10 (L) 50.0 - 100.0 ug/mL    Comment: Performed at [facility], [address] [place], [place] [address]    Blood Alcohol level:  Lab Results  Component Value Date   [facility] <10 01/01/2019   ETH <10 62/22/9798    Metabolic Disorder Labs: Lab Results  Component Value Date   HGBA1C 5.1 08/10/2018   MPG 99.67 08/10/2018   MPG 93.93 03/08/2018   No results found for: PROLACTIN Lab Results  Component Value Date   CHOL 175 08/10/2018   TRIG 103 08/10/2018   HDL 46 08/10/2018   CHOLHDL 3.8 08/10/2018   VLDL 21 08/10/2018   LDLCALC 108 (H) 08/10/2018   LDLCALC 168 (H) 03/08/2018    Physical Findings: AIMS:  , ,  ,  ,    CIWA:    COWS:     Musculoskeletal: Strength & Muscle Tone: within normal limits Gait & Station: normal Patient leans: N/A  Psychiatric Specialty Exam: Physical Exam  Nursing note and vitals reviewed. Constitutional: He is oriented to person, place, and time. He appears well-developed and  well-nourished.  HENT:  Head: Normocephalic and atraumatic.  Respiratory: Effort normal.  Neurological: He is alert and oriented to person, place, and time.    ROS  Blood pressure (!) 107/93, pulse 96, temperature (!)  97.5 F (36.4 C), temperature source Oral, resp. rate 18, height 6' (1.829 m), weight 94.3 kg, SpO2 100 %.Body mass index is 28.2 kg/m.  General Appearance: Casual  Eye Contact:  Minimal  Speech:  Normal Rate  Volume:  Decreased  Mood:  Dysphoric  Affect:  Constricted  Thought Process:  Goal Directed and Descriptions of Associations: Circumstantial  Orientation:  Full (Time, Place, and Person)  Thought Content:  Delusions  Suicidal Thoughts:  No  Homicidal Thoughts:  No  Memory:  Immediate;   Fair Recent;   Fair Remote;   Fair  Judgement:  Intact  Insight:  Lacking  Psychomotor Activity:  Decreased  Concentration:  Concentration: Fair and Attention Span: Fair  Recall:  FiservFair  Fund of Knowledge:  Fair  Language:  Good  Akathisia:  Negative  Handed:  Right  AIMS (if indicated):     Assets:  Desire for Improvement Resilience  ADL's:  Intact  Cognition:  WNL  Sleep:  Number of Hours: 6.75     Treatment Plan Summary: Daily contact with patient to assess and evaluate symptoms and progress in treatment, Medication management and Plan : Patient is seen and examined.  Patient is a 23 year old male with the above-stated past psychiatric history who is seen in follow-up.   Diagnosis: #1 schizoaffective disorder versus schizophrenia  Patient is essentially unchanged from yesterday.  He still remains isolated and guarded.  I gave him the clozapine 50 mg twice daily yesterday.  I am going to change that back to 100 mg p.o. nightly.  No change in the Depakote, Luvox or Haldol.  Hopefully he will begin to return to his baseline function. 1.  Change clozapine 200 mg p.o. nightly for psychosis. 2.  Continue Depakote DR 500 mg p.o. every 12 hours for mood stability. 3.   Continue Luvox 25 mg p.o. nightly for anxiety, mood and sedation. 4.  Continue haloperidol 10 mg p.o. nightly for psychosis. 5.  Continue hydroxyzine 25 mg p.o. every 6 hours as needed anxiety. 6.  Continue trazodone 100 mg p.o. nightly as needed insomnia. 7.  Continue Geodon 20 mg IM every 12 hours as needed agitation. 8.  Disposition planning-in progress.  Antonieta PertGreg Lawson , MD 01/03/2019, 8:51 AM

## 2019-01-04 DIAGNOSIS — R748 Abnormal levels of other serum enzymes: Secondary | ICD-10-CM

## 2019-01-04 DIAGNOSIS — F250 Schizoaffective disorder, bipolar type: Principal | ICD-10-CM

## 2019-01-04 LAB — CLOZAPINE (CLOZARIL)
Clozapine Lvl: NOT DETECTED ng/mL (ref 350–650)
NorClozapine: NOT DETECTED ng/mL
Total(Cloz+Norcloz): UNDETERMINED ng/mL

## 2019-01-04 MED ORDER — PALIPERIDONE ER 3 MG PO TB24
9.0000 mg | ORAL_TABLET | Freq: Every day | ORAL | Status: DC
  Administered 2019-01-04 – 2019-01-16 (×12): 9 mg via ORAL
  Filled 2019-01-04 (×14): qty 3

## 2019-01-04 MED ORDER — LORAZEPAM 1 MG PO TABS
1.0000 mg | ORAL_TABLET | Freq: Four times a day (QID) | ORAL | Status: DC
  Administered 2019-01-04 – 2019-01-06 (×6): 1 mg via ORAL
  Filled 2019-01-04 (×6): qty 1

## 2019-01-04 NOTE — Progress Notes (Signed)
D: Patient has been isolative to room. Power blinked off for a moment this evening and afterward, patient seemed fearful and was hiding behind the door. Guarded. Suspicious. Was observed holding his Depakote in his mouth instead of swallowing it. Swallowed it with encouragement. Denies SI, HI and AVH but appears internally preoccupied. A: Continue to monitor for safety. R: Safety maintained.

## 2019-01-04 NOTE — Progress Notes (Signed)
Recreation Therapy Notes  INPATIENT RECREATION THERAPY ASSESSMENT  Patient Details Name: TRAYCEN GOYER MRN: [id number] DOB: 10-14-1995 Today's Date: 01/04/2019       Information Obtained From: Patient  Able to Participate in Assessment/Interview: Yes  Patient Presentation: Responsive  Reason for Admission (Per Patient): Active Symptoms  Patient Stressors:    Coping Skills:   Talk  Leisure Interests (2+):  Social - Friends  Frequency of Recreation/Participation: Monthly  Awareness of Community Resources:     Community Resources:     Current Use:    If no, Barriers?:    Expressed Interest in [employer] Information:    [employer] of Residence:  Insurance underwriter  Patient Main Form of Transportation: Musician  Patient Strengths:  N/A  Patient Identified Areas of Improvement:  N/A  Patient Goal for Hospitalization:  To live  Current SI (including self-harm):  No  Current HI:  No  Current AVH: No  Staff Intervention Plan: Group Attendance, Collaborate with Interdisciplinary Treatment Team  Consent to Intern Participation: N/A  Frankee Gritz 01/04/2019, 10:59 AM

## 2019-01-04 NOTE — Tx Team (Addendum)
Interdisciplinary Treatment and Diagnostic Plan Update  01/04/2019 Time of Session: [name] [name] MRN: [id number]  Principal Diagnosis: <principal problem not specified>  Secondary Diagnoses: Active Problems:   Schizoaffective disorder, bipolar type (HCC)   Current Medications:  Current Facility-Administered Medications  Medication Dose Route Frequency Provider Last Rate Last Dose  . acetaminophen (TYLENOL) tablet 650 mg  650 mg Oral Q6H PRN Sharma Covert, MD      . alum & mag hydroxide-simeth (MAALOX/MYLANTA) 200-200-20 MG/5ML suspension 30 mL  30 mL Oral Q4H PRN Sharma Covert, MD      . cloZAPine (CLOZARIL) tablet 100 mg  100 mg Oral QHS Sharma Covert, MD      . divalproex (DEPAKOTE) DR tablet 500 mg  500 mg Oral Q12H Sharma Covert, MD   500 mg at 01/04/19 0929  . fluvoxaMINE (LUVOX) tablet 25 mg  25 mg Oral QHS Sharma Covert, MD   25 mg at 01/03/19 2131  . haloperidol (HALDOL) tablet 10 mg  10 mg Oral QHS Sharma Covert, MD   10 mg at 01/03/19 2131  . hydrOXYzine (ATARAX/VISTARIL) tablet 25 mg  25 mg Oral Q6H PRN Sharma Covert, MD      . magnesium hydroxide (MILK OF MAGNESIA) suspension 15 mL  15 mL Oral Daily PRN Sharma Covert, MD      . traZODone (DESYREL) tablet 100 mg  100 mg Oral QHS PRN Sharma Covert, MD      . ziprasidone (GEODON) injection 20 mg  20 mg Intramuscular Q6H PRN Sharma Covert, MD       PTA Medications: Medications Prior to Admission  Medication Sig Dispense Refill Last Dose  . benztropine (COGENTIN) 0.5 MG tablet Take 0.5 mg by mouth 2 (two) times daily.   Past Month at Unknown time  . cloZAPine (CLOZARIL) 100 MG tablet Take 3 tablets (300 mg total) by mouth at bedtime. 90 tablet 1` Past Month at Unknown time  . divalproex (DEPAKOTE) 500 MG DR tablet Take 1 tablet (500 mg total) by mouth every 12 (twelve) hours. 60 tablet 1 Past Month at Unknown time  . fluvoxaMINE (LUVOX) 25 MG tablet Take 1 tablet (25 mg  total) by mouth at bedtime. 30 tablet 1 Past Month at Unknown time  . metFORMIN (GLUCOPHAGE) 500 MG tablet Take 1 tablet (500 mg total) by mouth daily with breakfast. 30 tablet 1 Past Month at Unknown time  . metoprolol tartrate (LOPRESSOR) 25 MG tablet Take 1 tablet (25 mg total) by mouth 2 (two) times daily. 60 tablet 1 Past Month at Unknown time  . traZODone (DESYREL) 100 MG tablet Take 100 mg by mouth at bedtime.   prn at prn    Patient Stressors: Financial difficulties Medication change or noncompliance  Patient Strengths: Motivation for treatment/growth Supportive family/friends  Treatment Modalities: Medication Management, Group therapy, Case management,  1 to 1 session with clinician, Psychoeducation, Recreational therapy.   Physician Treatment Plan for Primary Diagnosis: <principal problem not specified> Long Term Goal(s): Improvement in symptoms so as ready for discharge Improvement in symptoms so as ready for discharge   Short Term Goals: Ability to identify changes in lifestyle to reduce recurrence of condition will improve Ability to verbalize feelings will improve Ability to demonstrate self-control will improve Ability to identify and develop effective coping behaviors will improve Ability to maintain clinical measurements within normal limits will improve Compliance with prescribed medications will improve Ability to identify changes in lifestyle to  reduce recurrence of condition will improve Ability to verbalize feelings will improve Ability to demonstrate self-control will improve Ability to identify and develop effective coping behaviors will improve Ability to maintain clinical measurements within normal limits will improve Compliance with prescribed medications will improve  Medication Management: Evaluate patient's response, side effects, and tolerance of medication regimen.  Therapeutic Interventions: 1 to 1 sessions, Unit Group sessions and Medication  administration.  Evaluation of Outcomes: Not Met  Physician Treatment Plan for Secondary Diagnosis: Active Problems:   Schizoaffective disorder, bipolar type ([place])  Long Term Goal(s): Improvement in symptoms so as ready for discharge Improvement in symptoms so as ready for discharge   Short Term Goals: Ability to identify changes in lifestyle to reduce recurrence of condition will improve Ability to verbalize feelings will improve Ability to demonstrate self-control will improve Ability to identify and develop effective coping behaviors will improve Ability to maintain clinical measurements within normal limits will improve Compliance with prescribed medications will improve Ability to identify changes in lifestyle to reduce recurrence of condition will improve Ability to verbalize feelings will improve Ability to demonstrate self-control will improve Ability to identify and develop effective coping behaviors will improve Ability to maintain clinical measurements within normal limits will improve Compliance with prescribed medications will improve     Medication Management: Evaluate patient's response, side effects, and tolerance of medication regimen.  Therapeutic Interventions: 1 to 1 sessions, Unit Group sessions and Medication administration.  Evaluation of Outcomes: Not Met   RN Treatment Plan for Primary Diagnosis: <principal problem not specified> Long Term Goal(s): Knowledge of disease and therapeutic regimen to maintain health will improve  Short Term Goals: Ability to participate in decision making will improve, Ability to verbalize feelings will improve, Ability to identify and develop effective coping behaviors will improve and Compliance with prescribed medications will improve  Medication Management: RN will administer medications as ordered by provider, will assess and evaluate patient's response and provide education to patient for prescribed medication. RN will  report any adverse and/or side effects to prescribing provider.  Therapeutic Interventions: 1 on 1 counseling sessions, Psychoeducation, Medication administration, Evaluate responses to treatment, Monitor vital signs and CBGs as ordered, Perform/monitor CIWA, COWS, AIMS and Fall Risk screenings as ordered, Perform wound care treatments as ordered.  Evaluation of Outcomes: Not Met   LCSW Treatment Plan for Primary Diagnosis: <principal problem not specified> Long Term Goal(s): Safe transition to appropriate next level of care at discharge, Engage patient in therapeutic group addressing interpersonal concerns.  Short Term Goals: Engage patient in aftercare planning with referrals and resources, Increase emotional regulation, Facilitate acceptance of mental health diagnosis and concerns and Increase skills for wellness and recovery  Therapeutic Interventions: Assess for all discharge needs, 1 to 1 time with Social worker, Explore available resources and support systems, Assess for adequacy in community support network, Educate family and significant other(s) on suicide prevention, Complete Psychosocial Assessment, Interpersonal group therapy.  Evaluation of Outcomes: Not Met   Progress in Treatment: Attending groups: No. Participating in groups: No. Taking medication as prescribed: Yes. Toleration medication: Yes. Family/Significant other contact made: No, will contact:  pt declined collateral contact Patient understands diagnosis: Yes. Discussing patient identified problems/goals with staff: Yes. Medical problems stabilized or resolved: Yes. Denies suicidal/homicidal ideation: Yes. Issues/concerns per patient self-inventory: No. Other: N/A  New problem(s) identified: No, Describe:  none  New Short Term/Long Term Goal(s):medication management for mood stabilization; development of comprehensive mental wellness/sobriety plan.   Patient Goals:  "to bet  better, where to go from this, and  not have to come back again"  Discharge Plan or Barriers: SPE pamphlet, Mobile Crisis information, and AA/NA information provided to patient for additional community support and resources. Pt is agreeable to follow up with his current [facility] ACTT team.  Reason for Continuation of Hospitalization: Depression Medication stabilization   Estimated Length of Stay: 5-7 days  Recreational Therapy: Patient Stressors: N/A  Patient Goal: Patient will engage in groups without prompting or encouragement from LRT x3 group sessions within 5 recreation therapy group sessions  Attendees: Patient: [name] [name] 01/04/2019 2:48 PM  Physician: Dr Weber Cooks MD 01/04/2019 2:48 PM  Nursing: Lyda Kalata RN 01/04/2019 2:48 PM  RN Care Manager: 01/04/2019 2:48 PM  Social Worker: Minette Brine Moton LCSW 01/04/2019 2:48 PM  Recreational Therapist:  01/04/2019 2:48 PM  Other:  01/04/2019 2:48 PM  Other:  01/04/2019 2:48 PM  Other: 01/04/2019 2:48 PM    Scribe for Treatment Team: Mariann Laster Moton, LCSW 01/04/2019 2:48 PM

## 2019-01-04 NOTE — Plan of Care (Signed)
D- Patient alert and oriented. Patient presents in a pleasant mood on assessment stating that he slept good last night and had no major complaints or concerns to voice to this Probation officer. Patient denies any signs/symtpoms of depression/anxiety. Patient also denies SI, HI, AVH, and pain at this time. Patient's goal for today is to "make it through the day".  A- Scheduled medications administered to patient, per MD orders. Support and encouragement provided.  Routine safety checks conducted every 15 minutes.  Patient informed to notify staff with problems or concerns.  R- No adverse drug reactions noted. Patient contracts for safety at this time. Patient compliant with medications and treatment plan. Patient receptive, calm, and cooperative. Patient interacts well with others on the unit. Patient remains safe at this time.  Problem: Activity: Goal: Will verbalize the importance of balancing activity with adequate rest periods Outcome: Progressing   Problem: Education: Goal: Will be free of psychotic symptoms Outcome: Progressing Goal: Knowledge of the prescribed therapeutic regimen will improve Outcome: Progressing   Problem: Coping: Goal: Coping ability will improve Outcome: Progressing Goal: Will verbalize feelings Outcome: Progressing   Problem: Health Behavior/Discharge Planning: Goal: Compliance with prescribed medication regimen will improve Outcome: Progressing   Problem: Nutritional: Goal: Ability to achieve adequate nutritional intake will improve Outcome: Progressing   Problem: Role Relationship: Goal: Ability to communicate needs accurately will improve Outcome: Progressing Goal: Ability to interact with others will improve Outcome: Progressing   Problem: Safety: Goal: Ability to redirect hostility and anger into socially appropriate behaviors will improve Outcome: Progressing Goal: Ability to remain free from injury will improve Outcome: Progressing   Problem:  Self-Care: Goal: Ability to participate in self-care as condition permits will improve Outcome: Progressing   Problem: Self-Concept: Goal: Will verbalize positive feelings about self Outcome: Progressing

## 2019-01-04 NOTE — Progress Notes (Signed)
[facility]  01/04/2019 4:19 PM [name] [name]  MRN:  [id number] Subjective: Follow-up for this gentleman with schizoaffective disorder brought into the hospital over the weekend after becoming virtually catatonic at home.  Patient seen chart reviewed.  Patient came to treatment team.  I spoke with Dr[facility]garwal [name] as [facility].  Patient well-known to our service is a man with schizophrenia or schizoaffective disorder who has done adequately on medication most particularly on clozapine but has a habit of repeatedly refusing medication outside the hospital resulting in profound decompensation such as the one we are seeing now.  Patient himself had no complaints today.  Would not answer direct questions about symptoms.  Made no eye contact.  Spoke in the vagus possible terms about wanting to just get better and that is why he was in the hospital.  Labs show that his clozapine level on presentation was 0 as would be expected from his noncompliance. Principal Problem: Schizoaffective disorder, bipolar type (HCC) Diagnosis: Principal Problem:   Schizoaffective disorder, bipolar type (HCC) Active Problems:   Aggressive behavior of adolescent   Noncompliance   Hypertension   Elevated CPK  Total Time spent with patient: 30 minutes  Past Psychiatric History: Patient has a history of chronic psychotic disorder that has declined rapidly given his young age.  Multiple hospitalizations.  Very problematic noncompliance.  Past Medical History:  Past Medical History:  Diagnosis Date  . Asthma   . Depression   . Psychosis [facility](HCC)     Past Surgical History:  Procedure Laterality Date  . BACK SURGERY     Family History: History reviewed. No pertinent family history. Family Psychiatric  History: See previous Social History:  Social History   Substance and Sexual Activity  Alcohol Use No     Social History   Substance and Sexual Activity  Drug Use Yes  . Types:  Marijuana    Social History   Socioeconomic History  . Marital status: Single    Spouse name: Not on file  . Number of children: Not on file  . Years of education: Not on file  . Highest education level: Not on file  Occupational History  . Not on file  Social Needs  . Financial resource strain: Not on file  . Food insecurity    Worry: Not on file    Inability: Not on file  . Transportation needs    Medical: Not on file    Non-medical: Not on file  Tobacco Use  . Smoking status: Never Smoker  . Smokeless tobacco: Never Used  Substance and Sexual Activity  . Alcohol use: No  . Drug use: Yes    Types: Marijuana  . Sexual activity: Not Currently  Lifestyle  . Physical activity    Days per week: Not on file    Minutes per session: Not on file  . Stress: Not on file  Relationships  . Social Musicianconnections    Talks on phone: Not on file    Gets together: Not on file    Attends religious service: Not on file    Active member of club or organization: Not on file    Attends meetings of clubs or organizations: Not on file    Relationship status: Not on file  Other Topics Concern  . Not on file  Social History Narrative  . Not on file   Additional Social History:  Sleep: Poor  Appetite:  Poor  Current Medications: Current Facility-Administered Medications  Medication Dose Route Frequency Provider Last Rate Last Dose  . acetaminophen (TYLENOL) tablet 650 mg  650 mg Oral Q6H PRN Sharma Covert, MD      . alum & mag hydroxide-simeth (MAALOX/MYLANTA) 200-200-20 MG/5ML suspension 30 mL  30 mL Oral Q4H PRN Sharma Covert, MD      . divalproex (DEPAKOTE) DR tablet 500 mg  500 mg Oral Q12H Sharma Covert, MD   500 mg at 01/04/19 0929  . fluvoxaMINE (LUVOX) tablet 25 mg  25 mg Oral QHS Sharma Covert, MD   25 mg at 01/03/19 2131  . haloperidol (HALDOL) tablet 10 mg  10 mg Oral QHS Sharma Covert, MD   10 mg at 01/03/19 2131  .  hydrOXYzine (ATARAX/VISTARIL) tablet 25 mg  25 mg Oral Q6H PRN Sharma Covert, MD      . LORazepam (ATIVAN) tablet 1 mg  1 mg Oral Q6H [name], [name], MD      . magnesium hydroxide (MILK OF MAGNESIA) suspension 15 mL  15 mL Oral Daily PRN Sharma Covert, MD      . paliperidone (INVEGA) 24 hr tablet 9 mg  9 mg Oral Daily [name], [name], MD      . traZODone (DESYREL) tablet 100 mg  100 mg Oral QHS PRN Sharma Covert, MD      . ziprasidone (GEODON) injection 20 mg  20 mg Intramuscular Q6H PRN Sharma Covert, MD        Lab Results: No results found for this or any previous visit (from the past 4 hour(s)).  Blood Alcohol level:  Lab Results  Component Value Date   ETH <10 01/01/2019   ETH <10 63/14/9702    Metabolic Disorder Labs: Lab Results  Component Value Date   HGBA1C 5.1 08/10/2018   MPG 99.67 08/10/2018   MPG 93.93 03/08/2018   No results found for: PROLACTIN Lab Results  Component Value Date   CHOL 175 08/10/2018   TRIG 103 08/10/2018   HDL 46 08/10/2018   CHOLHDL 3.8 08/10/2018   VLDL 21 08/10/2018   LDLCALC 108 (H) 08/10/2018   LDLCALC 168 (H) 03/08/2018    Physical Findings: AIMS:  , ,  ,  ,    CIWA:    COWS:     Musculoskeletal: Strength & Muscle Tone: within normal limits Gait & Station: normal Patient leans: N/A  Psychiatric Specialty Exam: Physical Exam  Nursing note and vitals reviewed. Constitutional: He appears well-developed and well-nourished.  HENT:  Head: Normocephalic and atraumatic.  Eyes: Pupils are equal, round, and reactive to light. Conjunctivae are normal.  Neck: Normal range of motion.  Cardiovascular: Regular rhythm and normal heart sounds.  Respiratory: Effort normal.  GI: Soft.  Musculoskeletal: Normal range of motion.  Neurological: He is alert.  Skin: Skin is warm and dry.  Psychiatric: His affect is blunt. His speech is delayed. He is slowed and withdrawn. Cognition and memory are impaired.    Review of  Systems  Unable to perform ROS: Psychiatric disorder    Blood pressure (!) 119/92, pulse (!) 103, temperature 97.6 F (36.4 C), temperature source Oral, resp. rate 18, height 6' (1.829 m), weight 94.3 kg, SpO2 100 %.Body mass index is 28.2 kg/m.  General Appearance: Disheveled  Eye Contact:  Poor  Speech:  Slow  Volume:  Decreased  Mood:  Euthymic  Affect:  Flat  Thought  Process:  NA  Orientation:  Negative  Thought Content:  Illogical  Suicidal Thoughts:  No  Homicidal Thoughts:  No  Memory:  Immediate;   Fair Recent;   Poor Remote;   Poor  Judgement:  Poor  Insight:  Lacking  Psychomotor Activity:  Decreased  Concentration:  Concentration: Poor  Recall:  Poor  Fund of Knowledge:  Poor  Language:  Poor  Akathisia:  No  Handed:  Right  AIMS (if indicated):     Assets:  Physical Health Social Support  ADL's:  Impaired  Cognition:  Impaired,  Moderate  Sleep:  Number of Hours: 4.5     Treatment Plan Summary: Daily contact with patient to assess and evaluate symptoms and progress in treatment, Medication management and Plan Family no patient is back to what is his frequent baseline in the hospital which is that he lays in bed all day rolled up in a blanket makes no eye contact speaks none hardly does anything at all.  Spoke with Dr. Michae KavaAgarwal who very reasonably suggeste ECT, although the patient is not likely to agree and we have no room on the schedule at this time.  I will try adding some standing Ativan to see if that helps with the catatonic-like presentation.  As noted previously he has done well with clozapine but refuses it once he gets out of the hospital.  We talked about trying to get him on long-acting injectable medicine.  I will try switching him over to in [place] in hopes that maybe that will help with the psychotic symptoms as well.  If so we could potentially switch him to a long-acting medication.  Carolanne Grumbling  [name] [name], MD 01/04/2019, 4:19 PM

## 2019-01-04 NOTE — [facility] Group Notes (Signed)
LCSW Group Therapy Note   01/04/2019 12:56 PM   Type of Therapy and Topic:  Group Therapy:  Overcoming Obstacles   Participation Level:  Did Not Attend   Description of Group:    In this group patients will be encouraged to explore what they see as obstacles to their own wellness and recovery. They will be guided to discuss their thoughts, feelings, and behaviors related to these obstacles. The group will process together ways to cope with barriers, with attention given to specific choices patients can make. Each patient will be challenged to identify changes they are motivated to make in order to overcome their obstacles. This group will be process-oriented, with patients participating in exploration of their own experiences as well as giving and receiving support and challenge from other group members.   Therapeutic Goals: 1. Patient will identify personal and current obstacles as they relate to admission. 2. Patient will identify barriers that currently interfere with their wellness or overcoming obstacles.  3. Patient will identify feelings, thought process and behaviors related to these barriers. 4. Patient will identify two changes they are willing to make to overcome these obstacles:      Summary of Patient Progress x     Therapeutic Modalities:   Cognitive Behavioral Therapy Solution Focused Therapy Motivational Interviewing Relapse Prevention Therapy  Evalina Field, MSW, LCSW Clinical Social Work 01/04/2019 12:56 PM

## 2019-01-04 NOTE — Progress Notes (Signed)
CSW spoke with Thressa Sheller, team lead for pts ACTT team. Thayer Headings reported that they were aware that pt was at the hospital and that they helped facilitate him being admitted. Thayer Headings reported they are going to have their psychiatrist contact Dr Weber Cooks to discuss pts medications. Thayer Headings reported she would call CSW Minette Brine or Herschel [place] back later this afternoon.   Evalina Field, MSW, LCSW Clinical Social Work 01/04/2019 9:06 AM

## 2019-01-04 NOTE — Plan of Care (Signed)
  Problem: Education: Goal: Will be free of psychotic symptoms Outcome: Progressing Goal: Knowledge of the prescribed therapeutic regimen will improve Outcome: Progressing   D: Patient has been isolative to room. Power blinked off for a moment this evening and afterward, patient seemed fearful and was hiding behind the door. Guarded. Suspicious. Was observed holding his Depakote in his mouth instead of swallowing it. Swallowed it with encouragement. Denies SI, HI and AVH but appears internally preoccupied. A: Continue to monitor for safety. R: Safety maintained.

## 2019-01-04 NOTE — Progress Notes (Signed)
Recreation Therapy Notes   Date: 01/04/2019  Time: 9:30 am   Location: Craft room   Behavioral response: N/A   Intervention Topic: Necessities  Discussion/Intervention: Patient did not attend group.   Clinical Observations/Feedback:  Patient did not attend group.   [name] [name] LRT/CTRS        [name] 01/04/2019 10:19 AM

## 2019-01-05 LAB — COMPREHENSIVE METABOLIC PANEL
ALT: 29 U/L (ref 0–44)
AST: 22 U/L (ref 15–41)
Albumin: 3.9 g/dL (ref 3.5–5.0)
Alkaline Phosphatase: 89 U/L (ref 38–126)
Anion gap: 7 (ref 5–15)
BUN: 8 mg/dL (ref 6–20)
CO2: 27 mmol/L (ref 22–32)
Calcium: 9 mg/dL (ref 8.9–10.3)
Chloride: 106 mmol/L (ref 98–111)
Creatinine, Ser: 0.9 mg/dL (ref 0.61–1.24)
GFR calc Af Amer: >60 mL/min (ref >60)
GFR calc non Af Amer: >60 mL/min (ref >60)
Glucose, Bld: 86 mg/dL (ref 70–99)
Potassium: 4 mmol/L (ref 3.5–5.1)
Sodium: 140 mmol/L (ref 135–145)
Total Bilirubin: 0.8 mg/dL (ref 0.3–1.2)
Total Protein: 6.9 g/dL (ref 6.5–8.1)

## 2019-01-05 LAB — CLOZAPINE (CLOZARIL)
Clozapine Lvl: 43 ng/mL — ABNORMAL LOW (ref 350–650)
NorClozapine: NOT DETECTED ng/mL
Total(Cloz+Norcloz): UNDETERMINED ng/mL

## 2019-01-05 LAB — CK: Total CK: 308 U/L (ref 49–397)

## 2019-01-05 NOTE — [facility] Group Notes (Signed)
Feelings Around Diagnosis 01/05/2019 1PM  Type of Therapy/Topic:  Group Therapy:  Feelings about Diagnosis  Participation Level:  Did Not Attend   Description of Group:   This group will allow patients to explore their thoughts and feelings about diagnoses they have received. Patients will be guided to explore their level of understanding and acceptance of these diagnoses. Facilitator will encourage patients to process their thoughts and feelings about the reactions of others to their diagnosis and will guide patients in identifying ways to discuss their diagnosis with significant others in their lives. This group will be process-oriented, with patients participating in exploration of their own experiences, giving and receiving support, and processing challenge from other group members.   Therapeutic Goals: 1. Patient will demonstrate understanding of diagnosis as evidenced by identifying two or more symptoms of the disorder 2. Patient will be able to express two feelings regarding the diagnosis 3. Patient will demonstrate their ability to communicate their needs through discussion and/or role play  Summary of Patient Progress:       Therapeutic Modalities:   Cognitive Behavioral Therapy Brief Therapy Feelings Identification    Sharanya Templin T Jenesis Suchy, LCSW 01/05/2019 2:04 PM  

## 2019-01-05 NOTE — Progress Notes (Signed)
[facility]  01/05/2019 3:24 PM Emeline GinsMohmed A Noori  MRN:  [id number] Subjective: Follow-up for this 23 year old man with schizophrenia or schizoaffective disorder.  Patient seen chart reviewed.  Patient spends almost all of his time isolated.  Today I came to see him in the early afternoon and once again found him rolled up in his blanket like a cocoon with only a tiny space open around his nose.  Patient said he was "fine".  Told me that his goal was to "leave here".  Could not give any articulate discussion or description of what he will do when he leaves here or what other goals he has for his life.  He claims that "I will do better" when he gets out of the hospital.  Seems very superficial.  As previously he seems to have little insight into his illness.  He has been taking his medication but so far has not improved much.  He does get up and attend meals and told the nurses that he was still hungry so I have gladly authorized him getting double portions.  As I may have mentioned yesterday I spoke with his psychiatrist from his act team and we acknowledged the frustrating nature of a patient who seems to uniquely respond to clozapine but is recurrently noncompliant.  The patient's mother left a message today asking that I call her and I have tried to do so but have had to leave a voicemail message.  Labs came back and his CK is continuing to trend down kidney function is fine.  Everything seems to have normalized there. Principal Problem: Schizoaffective disorder, bipolar type (HCC) Diagnosis: Principal Problem:   Schizoaffective disorder, bipolar type (HCC) Active Problems:   Aggressive behavior of adolescent   Noncompliance   Hypertension   Elevated CPK  Total Time spent with patient: 30 minutes  Past Psychiatric History: Patient has a history of psychotic disorder which has rapidly progressed over the last couple years probably because of the severity of the illness combined with his  noncompliance with outpatient treatment.  Multiple hospitalizations.  Past Medical History:  Past Medical History:  Diagnosis Date  . Asthma   . Depression   . Psychosis [facility](HCC)     Past Surgical History:  Procedure Laterality Date  . BACK SURGERY     Family History: History reviewed. No pertinent family history. Family Psychiatric  History: None reported Social History:  Social History   Substance and Sexual Activity  Alcohol Use No     Social History   Substance and Sexual Activity  Drug Use Yes  . Types: Marijuana    Social History   Socioeconomic History  . Marital status: Single    Spouse name: Not on file  . Number of children: Not on file  . Years of education: Not on file  . Highest education level: Not on file  Occupational History  . Not on file  Social Needs  . Financial resource strain: Not on file  . Food insecurity    Worry: Not on file    Inability: Not on file  . Transportation needs    Medical: Not on file    Non-medical: Not on file  Tobacco Use  . Smoking status: Never Smoker  . Smokeless tobacco: Never Used  Substance and Sexual Activity  . Alcohol use: No  . Drug use: Yes    Types: Marijuana  . Sexual activity: Not Currently  Lifestyle  . Physical activity    Days per  week: Not on file    Minutes per session: Not on file  . Stress: Not on file  Relationships  . Social Musicianconnections    Talks on phone: Not on file    Gets together: Not on file    Attends religious service: Not on file    Active member of club or organization: Not on file    Attends meetings of clubs or organizations: Not on file    Relationship status: Not on file  Other Topics Concern  . Not on file  Social History Narrative  . Not on file   Additional Social History:                         Sleep: Fair  Appetite:  Good  Current Medications: Current Facility-Administered Medications  Medication Dose Route Frequency Provider Last Rate Last Dose   . acetaminophen (TYLENOL) tablet 650 mg  650 mg Oral Q6H PRN Antonieta Pertlary, Greg Lawson, MD      . alum & mag hydroxide-simeth (MAALOX/MYLANTA) 200-200-20 MG/5ML suspension 30 mL  30 mL Oral Q4H PRN Antonieta Pertlary, Greg Lawson, MD      . divalproex (DEPAKOTE) DR tablet 500 mg  500 mg Oral Q12H Antonieta Pertlary, Greg Lawson, MD   500 mg at 01/05/19 0911  . fluvoxaMINE (LUVOX) tablet 25 mg  25 mg Oral QHS Antonieta Pertlary, Greg Lawson, MD   25 mg at 01/04/19 2147  . hydrOXYzine (ATARAX/VISTARIL) tablet 25 mg  25 mg Oral Q6H PRN Antonieta Pertlary, Greg Lawson, MD      . LORazepam (ATIVAN) tablet 1 mg  1 mg Oral Q6H , [name], MD   1 mg at 01/05/19 1129  . magnesium hydroxide (MILK OF MAGNESIA) suspension 15 mL  15 mL Oral Daily PRN Antonieta Pertlary, Greg Lawson, MD      . paliperidone (INVEGA) 24 hr tablet 9 mg  9 mg Oral Daily , [name], MD   9 mg at 01/05/19 0910  . traZODone (DESYREL) tablet 100 mg  100 mg Oral QHS PRN Antonieta Pertlary, Greg Lawson, MD      . ziprasidone (GEODON) injection 20 mg  20 mg Intramuscular Q6H PRN Antonieta Pertlary, Greg Lawson, MD        Lab Results:  Results for orders placed or performed during the hospital encounter of 01/02/19 (from the past 48 hour(s))  Comprehensive metabolic panel     Status: None   Collection Time: 01/05/19  7:30 AM  Result Value Ref Range   Sodium 140 135 - 145 mmol/L   Potassium 4.0 3.5 - 5.1 mmol/L   Chloride 106 98 - 111 mmol/L   CO2 27 22 - 32 mmol/L   Glucose, Bld 86 70 - 99 mg/dL   BUN 8 6 - 20 mg/dL   Creatinine, Ser 1.610.90 0.61 - 1.24 mg/dL   Calcium 9.0 8.9 - 09.610.3 mg/dL   Total Protein 6.9 6.5 - 8.1 g/dL   Albumin 3.9 3.5 - 5.0 g/dL   AST 22 15 - 41 U/L   ALT 29 0 - 44 U/L   Alkaline Phosphatase 89 38 - 126 U/L   Total Bilirubin 0.8 0.3 - 1.2 mg/dL   GFR calc non Af Amer >60 >60 mL/min   GFR calc Af Amer >60 >60 mL/min   Anion gap 7 5 - 15    Comment: Performed at [facility], [address] [place], [place] [address]  [place]     Status: None   Collection Time:  01/05/19  7:30 AM   Result Value Ref Range   Total CK 308 49 - 397 U/L    Comment: Performed at [facility], [address] [place], [place] [address]    Blood Alcohol level:  Lab Results  Component Value Date   [facility]   ETH <10 08/02/2018    Metabolic Disorder Labs: Lab Results  Component Value Date   HGBA1C 5.1 08/10/2018   MPG 99.67 08/10/2018   MPG 93.93 03/08/2018   No results found for: PROLACTIN Lab Results  Component Value Date   CHOL 175 08/10/2018   TRIG 103 08/10/2018   HDL 46 08/10/2018   CHOLHDL 3.8 08/10/2018   VLDL 21 08/10/2018   LDLCALC 108 (H) 08/10/2018   LDLCALC 168 (H) 03/08/2018    Physical Findings: AIMS:  , ,  ,  ,    CIWA:    COWS:     Musculoskeletal: Strength & Muscle Tone: within normal limits Gait & Station: normal Patient leans: N/A  Psychiatric Specialty Exam: Physical Exam  Nursing note and vitals reviewed. Constitutional: He appears well-developed and well-nourished.  HENT:  Head: Normocephalic and atraumatic.  Eyes: Pupils are equal, round, and reactive to light. Conjunctivae are normal.  Neck: Normal range of motion.  Cardiovascular: Regular rhythm and normal heart sounds.  Respiratory: Effort normal. No respiratory distress.  GI: Soft.  Musculoskeletal: Normal range of motion.  Neurological: He is alert.  Skin: Skin is warm and dry.  Psychiatric: His affect is blunt. He is withdrawn. He is noncommunicative.    Review of Systems  Unable to perform ROS: Psychiatric disorder    Blood pressure 130/78, pulse 94, temperature 97.6 F (36.4 C), temperature source Oral, resp. rate 17, height 6' (1.829 m), weight 94.3 kg, SpO2 100 %.Body mass index is 28.2 kg/m.  General Appearance: Disheveled  Eye Contact:  None  Speech:  Slow  Volume:  Decreased  Mood:  Euthymic  Affect:  Flat  Thought Process:  Disorganized  Orientation:  Full (Time, Place, and Person)  Thought Content:  Illogical, Paranoid Ideation, Rumination  and Tangential  Suicidal Thoughts:  No  Homicidal Thoughts:  No  Memory:  Immediate;   Fair Recent;   Poor Remote;   Poor  Judgement:  Impaired  Insight:  Shallow  Psychomotor Activity:  Decreased  Concentration:  Concentration: Poor  Recall:  Fair  Fund of Knowledge:  Fair  Language:  Fair  Akathisia:  No  Handed:  Right  AIMS (if indicated):     Assets:  Desire for Improvement Housing  ADL's:  Impaired  Cognition:  Impaired,  Mild  Sleep:  Number of Hours: 4     Treatment Plan Summary: Daily contact with patient to assess and evaluate symptoms and progress in treatment, Medication management and Plan Patient is currently back on clozapine.  We have gone with his previous dosage.  We do not have a lot of guidelines to go by.  His clozapine level on admission was 0 consistent with his noncompliance.  I have also started him on in [place] in the evening after the discussion with Dr. Scotty CourtAgerwal.  Spent a few minutes talking with him today trying to explain our situation and the rationale for treatment however we really hoped that we were on the same side and wanting him to be able to function outside the hospital.  Appealed to his intellectual understanding of his illness.  Acknowledged what a terrible burden it is to have to  take medicine.  Patient grunted most of his responses to these.  Not really engaged much.  I will try and keep getting in touch with his mother.  I know that she has a powerful belief that the patient has adverse reactions to Haldol and Ativan.  I believe from reviewing the chart the Haldol just caused him to have dystonia and I am not sure what the concern about Ativan was.  I added back some standing Ativan out of concern that what he has now is somewhat like catatonia.  So far it does not seem to be having much benefit and I am not sure if his current condition is really best seen as catatonia or just extreme negative symptoms of schizophrenia.  In any case keep working with  him daily encouraging him trying to get him out of bed and interact.  Alethia Berthold, MD 01/05/2019, 3:24 PM

## 2019-01-05 NOTE — Plan of Care (Signed)
D- Patient alert and oriented. Patient presents in a pleasant mood on assessment stating that he slept alright last night, however, it was reported off that he didn't sleep at all. Patient denies any signs/symptoms of depression/anxiety, however, he reported on his self-inventory a "3/10" and "4/10", but did not elaborate on why he reported that to this Probation officer. Patient also denies SI, HI, AVH, and pain at this time. Patient's goal for today is "get prepared to leave". This Probation officer stated to patient that getting out of bed more and attending groups will help show staff that he is motivated/ready to leave.   A- Scheduled medications administered to patient, per MD orders. Support and encouragement provided.  Routine safety checks conducted every 15 minutes.  Patient informed to notify staff with problems or concerns.  R- No adverse drug reactions noted. Patient contracts for safety at this time. Patient compliant with medications and treatment plan. Patient receptive, calm, and cooperative. Patient interacts well with others on the unit.  Patient remains safe at this time.  Problem: Activity: Goal: Will verbalize the importance of balancing activity with adequate rest periods Outcome: Progressing   Problem: Education: Goal: Will be free of psychotic symptoms Outcome: Progressing Goal: Knowledge of the prescribed therapeutic regimen will improve Outcome: Progressing   Problem: Coping: Goal: Coping ability will improve Outcome: Progressing Goal: Will verbalize feelings Outcome: Progressing   Problem: Health Behavior/Discharge Planning: Goal: Compliance with prescribed medication regimen will improve Outcome: Progressing   Problem: Nutritional: Goal: Ability to achieve adequate nutritional intake will improve Outcome: Progressing   Problem: Role Relationship: Goal: Ability to communicate needs accurately will improve Outcome: Progressing Goal: Ability to interact with others will  improve Outcome: Progressing   Problem: Safety: Goal: Ability to redirect hostility and anger into socially appropriate behaviors will improve Outcome: Progressing Goal: Ability to remain free from injury will improve Outcome: Progressing   Problem: Self-Care: Goal: Ability to participate in self-care as condition permits will improve Outcome: Progressing   Problem: Self-Concept: Goal: Will verbalize positive feelings about self Outcome: Progressing

## 2019-01-05 NOTE — Plan of Care (Addendum)
Stayed in the milieu until bedtime. Alert and oriented. Pleasant and cooperative. Denying hallucinations. Stated "Yeah I came back because I was lazy to take my medications...but this time I will be taking them...". patient had a snack, received medications voluntarily then went to bed. Did not have and concern. Currently in his room, awake. No sign of distress noted. Safety monitored per unit protocol.

## 2019-01-05 NOTE — Progress Notes (Signed)
Recreation Therapy Notes  Date: 01/05/2019  Time: 9:30 am   Location: Craft room   Behavioral response: N/A   Intervention Topic: Coping skills  Discussion/Intervention: Patient did not attend group.   Clinical Observations/Feedback:  Patient did not attend group.   Layce Sprung LRT/CTRS        [name] [name] 01/05/2019 11:10 AM 

## 2019-01-05 NOTE — Progress Notes (Signed)
Patient's mother called again and expressed that she does not want patient to take Ativan as well as Invega. Patient's mother does not prefer for patient to be started on new medication because "he doesn't do well on new medication, he always has side effects".

## 2019-01-05 NOTE — Plan of Care (Signed)
  Problem: Education: Goal: Knowledge of the prescribed therapeutic regimen will improve Outcome: Progressing  Patient compliant with prescribed therapeutic regimen.

## 2019-01-06 NOTE — Progress Notes (Signed)
D: Patient has been present on the unit. Calm and cooperative. Isolative to self. Denies SI, HI and AVH. Medication compliant. A: Continue to monitor for safety. R: Safety maintained.

## 2019-01-06 NOTE — Progress Notes (Signed)
Patient alert and oriented x 4, no bizarre behavior noted, he appears less anxious, interacting appropriately with peers and staff, and compliant with medication regimen, his thoughts are organized and coherent, he denies SI/HI/AVH.  Patient asked this evening if he could have double porions on snacks and he was given, per MD standing orders. Patient attended evening wrap up group, he was appropriate, he  paced for some time this evening and he eventually went to his room. 15 minutes safety checks maintained will continue to monitor.

## 2019-01-06 NOTE — Progress Notes (Signed)
[facility]  01/06/2019 4:48 PM [name] [name]  MRN:  [id number] Subjective: Follow-up for this young man with schizophrenia or schizoaffective disorder.  Patient seen chart reviewed.  Also spoke with the patient's mother on the telephone.  Patient still does not get up very much but he is starting to do a little bit more.  He was willing to get out of bed and come sit down and talk with me.  He is occasionally gone to some activities.  Patient still does not talk very much.  He denies that he is having any hallucinations.  He will only describe himself as being "fine".  He is obviously on functional right now.  Keeps his eyes closed almost all the time.  Moves very slowly.  Very slow and blocked thinking.  So far has been tolerating the Invega without any signs of any side effects. Principal Problem: Schizoaffective disorder, bipolar type (HCC) Diagnosis: Principal Problem:   Schizoaffective disorder, bipolar type (HCC) Active Problems:   Aggressive behavior of adolescent   Noncompliance   Hypertension   Elevated CPK  Total Time spent with patient: 30 minutes  Past Psychiatric History: Patient has a history of rapidly worsening schizophrenia over probably the last 3 years.  He has a long history of noncompliance with his medicine.  At times there is been concern that his family is less than completely supportive of his treatment but today the mother was very much articulating her support for the treatment although she still believes that only clozapine would be an appropriate medicine.  Past Medical History:  Past Medical History:  Diagnosis Date  . Asthma   . Depression   . Psychosis [facility](HCC)     Past Surgical History:  Procedure Laterality Date  . BACK SURGERY     Family History: History reviewed. No pertinent family history. Family Psychiatric  History: None reported Social History:  Social History   Substance and Sexual Activity  Alcohol Use No     Social History    Substance and Sexual Activity  Drug Use Yes  . Types: Marijuana    Social History   Socioeconomic History  . Marital status: Single    Spouse name: Not on file  . Number of children: Not on file  . Years of education: Not on file  . Highest education level: Not on file  Occupational History  . Not on file  Social Needs  . Financial resource strain: Not on file  . Food insecurity    Worry: Not on file    Inability: Not on file  . Transportation needs    Medical: Not on file    Non-medical: Not on file  Tobacco Use  . Smoking status: Never Smoker  . Smokeless tobacco: Never Used  Substance and Sexual Activity  . Alcohol use: No  . Drug use: Yes    Types: Marijuana  . Sexual activity: Not Currently  Lifestyle  . Physical activity    Days per week: Not on file    Minutes per session: Not on file  . Stress: Not on file  Relationships  . Social Musicianconnections    Talks on phone: Not on file    Gets together: Not on file    Attends religious service: Not on file    Active member of club or organization: Not on file    Attends meetings of clubs or organizations: Not on file    Relationship status: Not on file  Other Topics Concern  .  Not on file  Social History Narrative  . Not on file   Additional Social History:                         Sleep: Fair  Appetite:  Fair  Current Medications: Current Facility-Administered Medications  Medication Dose Route Frequency Provider Last Rate Last Dose  . acetaminophen (TYLENOL) tablet 650 mg  650 mg Oral Q6H PRN Antonieta Pertlary, Greg Lawson, MD      . alum & mag hydroxide-simeth (MAALOX/MYLANTA) 200-200-20 MG/5ML suspension 30 mL  30 mL Oral Q4H PRN Antonieta Pertlary, Greg Lawson, MD      . divalproex (DEPAKOTE) DR tablet 500 mg  500 mg Oral Q12H Antonieta Pertlary, Greg Lawson, MD   500 mg at 01/06/19 0811  . fluvoxaMINE (LUVOX) tablet 25 mg  25 mg Oral QHS Antonieta Pertlary, Greg Lawson, MD   25 mg at 01/05/19 2217  . hydrOXYzine (ATARAX/VISTARIL) tablet 25  mg  25 mg Oral Q6H PRN Antonieta Pertlary, Greg Lawson, MD      . magnesium hydroxide (MILK OF MAGNESIA) suspension 15 mL  15 mL Oral Daily PRN Antonieta Pertlary, Greg Lawson, MD      . paliperidone (INVEGA) 24 hr tablet 9 mg  9 mg Oral Daily Mistee Soliman, [name], MD   9 mg at 01/06/19 0811  . traZODone (DESYREL) tablet 100 mg  100 mg Oral QHS PRN Antonieta Pertlary, Greg Lawson, MD   100 mg at 01/05/19 2216    Lab Results:  Results for orders placed or performed during the hospital encounter of 01/02/19 (from the past 48 hour(s))  Comprehensive metabolic panel     Status: None   Collection Time: 01/05/19  7:30 AM  Result Value Ref Range   Sodium 140 135 - 145 mmol/L   Potassium 4.0 3.5 - 5.1 mmol/L   Chloride 106 98 - 111 mmol/L   CO2 27 22 - 32 mmol/L   Glucose, Bld 86 70 - 99 mg/dL   BUN 8 6 - 20 mg/dL   Creatinine, Ser 5.400.90 0.61 - 1.24 mg/dL   Calcium 9.0 8.9 - 98.110.3 mg/dL   Total Protein 6.9 6.5 - 8.1 g/dL   Albumin 3.9 3.5 - 5.0 g/dL   AST 22 15 - 41 U/L   ALT 29 0 - 44 U/L   Alkaline Phosphatase 89 38 - 126 U/L   Total Bilirubin 0.8 0.3 - 1.2 mg/dL   GFR calc non Af Amer >60 >60 mL/min   GFR calc Af Amer >60 >60 mL/min   Anion gap 7 5 - 15    Comment: Performed at [facility], [address] [place], [place] [address]  [place]     Status: None   Collection Time: 01/05/19  7:30 AM  Result Value Ref Range   Total CK 308 49 - 397 U/L    Comment: Performed at [facility], [address] [place], [place] [address]    Blood Alcohol level:  Lab Results  Component Value Date   [facility] 01/01/2019   ETH <10 08/02/2018    Metabolic Disorder Labs: Lab Results  Component Value Date   HGBA1C 5.1 08/10/2018   MPG 99.67 08/10/2018   MPG 93.93 03/08/2018   No results found for: PROLACTIN Lab Results  Component Value Date   CHOL 175 08/10/2018   TRIG 103 08/10/2018   HDL 46 08/10/2018   CHOLHDL 3.8 08/10/2018   VLDL 21 08/10/2018   LDLCALC 108 (H) 08/10/2018  LDLCALC 168 (H) 03/08/2018     Physical Findings: AIMS:  , ,  ,  ,    CIWA:    COWS:     Musculoskeletal: Strength & Muscle Tone: within normal limits Gait & Station: normal Patient leans: N/A  Psychiatric Specialty Exam: Physical Exam  Nursing note and vitals reviewed. Constitutional: He appears well-developed and well-nourished.  HENT:  Head: Normocephalic and atraumatic.  Eyes: Pupils are equal, round, and reactive to light. Conjunctivae are normal.  Neck: Normal range of motion.  Cardiovascular: Regular rhythm and normal heart sounds.  Respiratory: Effort normal. No respiratory distress.  GI: Soft.  Musculoskeletal: Normal range of motion.  Neurological: He is alert.  Skin: Skin is warm and dry.  Psychiatric: Judgment normal. His affect is blunt. His speech is delayed. He is slowed and withdrawn. Cognition and memory are impaired. He expresses no homicidal and no suicidal ideation.    Review of Systems  Constitutional: Negative.   HENT: Negative.   Eyes: Negative.   Respiratory: Negative.   Cardiovascular: Negative.   Gastrointestinal: Negative.   Musculoskeletal: Negative.   Skin: Negative.   Neurological: Negative.   Psychiatric/Behavioral: Positive for memory loss. Negative for depression, hallucinations, substance abuse and suicidal ideas. The patient is not nervous/anxious and does not have insomnia.     Blood pressure 130/78, pulse 94, temperature 97.6 F (36.4 C), temperature source Oral, resp. rate 17, height 6' (1.829 m), weight 94.3 kg, SpO2 100 %.Body mass index is 28.2 kg/m.  General Appearance: Casual  Eye Contact:  Minimal  Speech:  Slow  Volume:  Decreased  Mood:  Euthymic  Affect:  Flat  Thought Process:  Coherent  Orientation:  Full (Time, Place, and Person)  Thought Content:  Illogical, Rumination and Tangential  Suicidal Thoughts:  No  Homicidal Thoughts:  No  Memory:  Immediate;   Fair Recent;   Poor Remote;   Poor  Judgement:  Impaired  Insight:  Shallow   Psychomotor Activity:  Decreased  Concentration:  Concentration: Poor  Recall:  Poor  Fund of Knowledge:  Poor  Language:  Poor  Akathisia:  No  Handed:  Right  AIMS (if indicated):     Assets:  Desire for Improvement Housing Social Support  ADL's:  Impaired  Cognition:  Impaired,  Mild  Sleep:  Number of Hours: 4.5     Treatment Plan Summary: Daily contact with patient to assess and evaluate symptoms and progress in treatment, Medication management and Plan Patient with schizophrenia once more declining into at times almost catatonic withdrawal of negative symptoms when off of his clozapine.  This is been a repeated pattern.  I explained to both the patient and his mother why I agreed with the act team that it was worth trying a long-acting injectable to see if that could prevent the repeat hospitalizations.  Reluctantly mother is understanding of this patient has no complaints at all and is agreeable to it.  We are trying to get him stabilized on in [place].  No change to medicine today.  Alethia Berthold, MD 01/06/2019, 4:48 PM

## 2019-01-06 NOTE — Plan of Care (Signed)
  Problem: Education: Goal: Will be free of psychotic symptoms Outcome: Progressing Goal: Knowledge of the prescribed therapeutic regimen will improve Outcome: Progressing  D: Patient has been present on the unit. Calm and cooperative. Isolative to self. Denies SI, HI and AVH. Medication compliant. A: Continue to monitor for safety. R: Safety maintained.

## 2019-01-06 NOTE — Plan of Care (Signed)
Patient is alert and oriented X 3, Patient denies SI, HI and AVH. Patient more visible in the milieu, patient interacting appropriately with staff and peers. Patient actually went to group this morning. Patient denies pain. No stiffness in movements observed. Patient forwards very little, blunted but also pleasant and cooperative. Patient taking medications as ordered. Safety checks Q 15 minutes to continue. No problems sleeping or eating. Problem: Activity: Goal: Will verbalize the importance of balancing activity with adequate rest periods Outcome: Progressing   Problem: Education: Goal: Will be free of psychotic symptoms Outcome: Progressing Goal: Knowledge of the prescribed therapeutic regimen will improve Outcome: Progressing   Problem: Coping: Goal: Coping ability will improve Outcome: Progressing Goal: Will verbalize feelings Outcome: Progressing   Problem: Health Behavior/Discharge Planning: Goal: Compliance with prescribed medication regimen will improve Outcome: Progressing

## 2019-01-06 NOTE — [facility] Group Notes (Signed)
LCSW Group Therapy Note  01/06/2019 1:00 PM  Type of Therapy/Topic:  Group Therapy:  Emotion Regulation  Participation Level:  Did Not Attend   Description of Group:   The purpose of this group is to assist patients in learning to regulate negative emotions and experience positive emotions. Patients will be guided to discuss ways in which they have been vulnerable to their negative emotions. These vulnerabilities will be juxtaposed with experiences of positive emotions or situations, and patients will be challenged to use positive emotions to combat negative ones. Special emphasis will be placed on coping with negative emotions in conflict situations, and patients will process healthy conflict resolution skills.  Therapeutic Goals: 1. Patient will identify two positive emotions or experiences to reflect on in order to balance out negative emotions 2. Patient will label two or more emotions that they find the most difficult to experience 3. Patient will demonstrate positive conflict resolution skills through discussion and/or role plays  Summary of Patient Progress: X      Therapeutic Modalities:   Cognitive Behavioral Therapy Feelings Identification Dialectical Behavioral Therapy  Assunta Curtis, MSW, LCSW 01/06/2019 12:45 PM

## 2019-01-06 NOTE — Progress Notes (Signed)
Recreation Therapy Notes    Date: 01/06/2019   Time: 9:30 am   Location: Craft room   Behavioral response: N/A   Intervention Topic: Anger Management   Discussion/Intervention: Patient did not attend group.   Clinical Observations/Feedback:  Patient did not attend group.   Idil Maslanka LRT/CTRS          Lindsy Cerullo 01/06/2019 10:30 AM 

## 2019-01-06 NOTE — Progress Notes (Signed)
CSW spoke with Santiago Glad from [place] who wanted an update on pt. CSW informed Santiago Glad that there is no discharge date yet and pt has not been out of his room much. Santiago Glad discussed wanting pt to be on an injectable due to pt not keeping up with his oral meds when at home. CSW reported that attending physician is aware and plans to talk with pt about an injectable.   Evalina Field, MSW, LCSW Clinical Social Work 01/06/2019 3:39 PM

## 2019-01-07 MED ORDER — NICOTINE 7 MG/24HR TD PT24
7.0000 mg | MEDICATED_PATCH | Freq: Every day | TRANSDERMAL | Status: DC
  Administered 2019-01-07 – 2019-01-24 (×4): 7 mg via TRANSDERMAL
  Filled 2019-01-07 (×8): qty 1

## 2019-01-07 NOTE — Plan of Care (Signed)
Patient is alert and oriented X 3, denies SI, HI and AVH. Patient is pleasant, but very quiet on the unit. Paces the hallway or stays in his room only coming out today in search of food. Patient denies any pain 0/10, forwards very little, states he slept "well but continues to be very hungry even after eating meals." Patient affect is flat and blunted, but cooperative on the unit and with taking medications. Safety checks Q 15 minutes to continue. Problem: Activity: Goal: Will verbalize the importance of balancing activity with adequate rest periods Outcome: Progressing   Problem: Education: Goal: Will be free of psychotic symptoms Outcome: Progressing Goal: Knowledge of the prescribed therapeutic regimen will improve Outcome: Progressing   Problem: Health Behavior/Discharge Planning: Goal: Compliance with prescribed medication regimen will improve Outcome: Progressing

## 2019-01-07 NOTE — Progress Notes (Signed)
Recreation Therapy Notes  Date: 01/07/2019  Time: 9:30 am   Location: Craft room   Behavioral response: N/A   Intervention Topic: Communication  Discussion/Intervention: Patient did not attend group.   Clinical Observations/Feedback:  Patient did not attend group.   Esteven Overfelt LRT/CTRS        Newton Frutiger 01/07/2019 11:03 AM

## 2019-01-07 NOTE — Progress Notes (Signed)
[facility]  01/07/2019 6:21 PM [name] [name]  MRN:  [id number] Subjective: Follow-up patient with schizophrenia.  Patient is now on in [place] oral at nighttime.  He is showing some slight improvement.  He is up out of bed more frequently.  He is able to carry on conversations with some full sentences.  He has been able to come out and be around people with his eyes open.  Patient admits that he is feeling a little bit better.  Still appears to be responding to internal stimuli.  No sign of any suicidal ideation. Principal Problem: Schizoaffective disorder, bipolar type (HCC) Diagnosis: Principal Problem:   Schizoaffective disorder, bipolar type (HCC) Active Problems:   Aggressive behavior of adolescent   Noncompliance   Hypertension   Elevated CPK  Total Time spent with patient: 30 minutes  Past Psychiatric History: Patient has a history of schizophrenia for the past several years with good response to some medicine but poor history of compliance  Past Medical History:  Past Medical History:  Diagnosis Date  . Asthma   . Depression   . Psychosis [facility](HCC)     Past Surgical History:  Procedure Laterality Date  . BACK SURGERY     Family History: History reviewed. No pertinent family history. Family Psychiatric  History: None reported Social History:  Social History   Substance and Sexual Activity  Alcohol Use No     Social History   Substance and Sexual Activity  Drug Use Yes  . Types: Marijuana    Social History   Socioeconomic History  . Marital status: Single    Spouse name: Not on file  . Number of children: Not on file  . Years of education: Not on file  . Highest education level: Not on file  Occupational History  . Not on file  Social Needs  . Financial resource strain: Not on file  . Food insecurity    Worry: Not on file    Inability: Not on file  . Transportation needs    Medical: Not on file    Non-medical: Not on file  Tobacco Use  .  Smoking status: Never Smoker  . Smokeless tobacco: Never Used  Substance and Sexual Activity  . Alcohol use: No  . Drug use: Yes    Types: Marijuana  . Sexual activity: Not Currently  Lifestyle  . Physical activity    Days per week: Not on file    Minutes per session: Not on file  . Stress: Not on file  Relationships  . Social Musicianconnections    Talks on phone: Not on file    Gets together: Not on file    Attends religious service: Not on file    Active member of club or organization: Not on file    Attends meetings of clubs or organizations: Not on file    Relationship status: Not on file  Other Topics Concern  . Not on file  Social History Narrative  . Not on file   Additional Social History:                         Sleep: Fair  Appetite:  Fair  Current Medications: Current Facility-Administered Medications  Medication Dose Route Frequency Provider Last Rate Last Dose  . acetaminophen (TYLENOL) tablet 650 mg  650 mg Oral Q6H PRN Antonieta Pertlary, Greg Lawson, MD      . alum & mag hydroxide-simeth (MAALOX/MYLANTA) 200-200-20 MG/5ML suspension 30 mL  30 mL Oral Q4H PRN Antonieta Pertlary, Greg Lawson, MD      . divalproex (DEPAKOTE) DR tablet 500 mg  500 mg Oral Q12H Antonieta Pertlary, Greg Lawson, MD   500 mg at 01/07/19 40980822  . fluvoxaMINE (LUVOX) tablet 25 mg  25 mg Oral QHS Antonieta Pertlary, Greg Lawson, MD   25 mg at 01/06/19 2136  . hydrOXYzine (ATARAX/VISTARIL) tablet 25 mg  25 mg Oral Q6H PRN Antonieta Pertlary, Greg Lawson, MD      . magnesium hydroxide (MILK OF MAGNESIA) suspension 15 mL  15 mL Oral Daily PRN Antonieta Pertlary, Greg Lawson, MD      . nicotine (NICODERM CQ - dosed in mg/24 hr) patch 7 mg  7 mg Transdermal Daily , [name], MD   7 mg at 01/07/19 1800  . paliperidone (INVEGA) 24 hr tablet 9 mg  9 mg Oral Daily , [name], MD   9 mg at 01/07/19 11910822  . traZODone (DESYREL) tablet 100 mg  100 mg Oral QHS PRN Antonieta Pertlary, Greg Lawson, MD   100 mg at 01/05/19 2216    Lab Results: No results found for this or any  previous visit (from the past 48 hour(s)).  Blood Alcohol level:  Lab Results  Component Value Date   ETH <10 01/01/2019   ETH <10 08/02/2018    Metabolic Disorder Labs: Lab Results  Component Value Date   HGBA1C 5.1 08/10/2018   MPG 99.67 08/10/2018   MPG 93.93 03/08/2018   No results found for: PROLACTIN Lab Results  Component Value Date   CHOL 175 08/10/2018   TRIG 103 08/10/2018   HDL 46 08/10/2018   CHOLHDL 3.8 08/10/2018   VLDL 21 08/10/2018   LDLCALC 108 (H) 08/10/2018   LDLCALC 168 (H) 03/08/2018    Physical Findings: AIMS:  , ,  ,  ,    CIWA:    COWS:     Musculoskeletal: Strength & Muscle Tone: within normal limits Gait & Station: normal Patient leans: N/A  Psychiatric Specialty Exam: Physical Exam  Nursing note and vitals reviewed. Constitutional: He appears well-developed and well-nourished.  HENT:  Head: Normocephalic and atraumatic.  Eyes: Pupils are equal, round, and reactive to light. Conjunctivae are normal.  Neck: Normal range of motion.  Cardiovascular: Regular rhythm and normal heart sounds.  Respiratory: Effort normal. No respiratory distress.  GI: Soft.  Musculoskeletal: Normal range of motion.  Neurological: He is alert.  Skin: Skin is warm and dry.  Psychiatric: His affect is blunt. His speech is delayed. He is slowed and withdrawn. Thought content is paranoid. Cognition and memory are impaired. He expresses inappropriate judgment. He expresses no suicidal ideation.    Review of Systems  Constitutional: Negative.   HENT: Negative.   Eyes: Negative.   Respiratory: Negative.   Cardiovascular: Negative.   Gastrointestinal: Negative.   Musculoskeletal: Negative.   Skin: Negative.   Neurological: Negative.   Psychiatric/Behavioral: Positive for hallucinations. Negative for depression, substance abuse and suicidal ideas. The patient is nervous/anxious.     Blood pressure 124/80, pulse 83, temperature 98.2 F (36.8 C), temperature  source Oral, resp. rate 18, height 6' (1.829 m), weight 94.3 kg, SpO2 99 %.Body mass index is 28.2 kg/m.  General Appearance: Casual  Eye Contact:  Minimal  Speech:  Slow  Volume:  Decreased  Mood:  Dysphoric  Affect:  Constricted  Thought Process:  Disorganized  Orientation:  Full (Time, Place, and Person)  Thought Content:  Illogical  Suicidal Thoughts:  No  Homicidal Thoughts:  No  Memory:  Immediate;   Fair Recent;   Fair Remote;   Fair  Judgement:  Impaired  Insight:  Shallow  Psychomotor Activity:  Decreased  Concentration:  Concentration: Fair  Recall:  [employer] of Knowledge:  Fair  Language:  Fair  Akathisia:  No  Handed:  Right  AIMS (if indicated):     Assets:  Desire for Improvement Housing  ADL's:  Intact  Cognition:  Impaired,  Mild  Sleep:  Number of Hours: 5.75     Treatment Plan Summary: Daily contact with patient to assess and evaluate symptoms and progress in treatment, Medication management and Plan The plan has been to try to get him to respond to [facility] because it has such a convenient long-acting injectable.  I think there is some good signs at this point.  Continue off of the clozapine for now.  Continue the Depakote and in [place].  Encourage patient to get out of bed and interact with others.  No change to medicine otherwise today.  Alethia Berthold, MD 01/07/2019, 6:21 PM

## 2019-01-07 NOTE — [facility] Group Notes (Signed)
LCSW Group Therapy Note  01/07/2019 12:36 PM  Type of Therapy/Topic:  Group Therapy:  Balance in Life  Participation Level:  None  Description of Group:    This group will address the concept of balance and how it feels and looks when one is unbalanced. Patients will be encouraged to process areas in their lives that are out of balance and identify reasons for remaining unbalanced. Facilitators will guide patients in utilizing problem-solving interventions to address and correct the stressor making their life unbalanced. Understanding and applying boundaries will be explored and addressed for obtaining and maintaining a balanced life. Patients will be encouraged to explore ways to assertively make their unbalanced needs known to significant others in their lives, using other group members and facilitator for support and feedback.  Therapeutic Goals: 1. Patient will identify two or more emotions or situations they have that consume much of in their lives. 2. Patient will identify signs/triggers that life has become out of balance:  3. Patient will identify two ways to set boundaries in order to achieve balance in their lives:  4. Patient will demonstrate ability to communicate their needs through discussion and/or role plays  Summary of Patient Progress: Pt came into group late, stayed about 2 minutes and then left. Pt returned again later and sat to the side of group, but did not participate in the group discussion at all.     Therapeutic Modalities:   Cognitive Behavioral Therapy Solution-Focused Therapy Assertiveness Training  Evalina Field, MSW, LCSW Clinical Social Work 01/07/2019 12:36 PM

## 2019-01-08 NOTE — Progress Notes (Signed)
Patient alert and oriented x 3 with periods of confusion to situation, affect is flat, he appears anxious, tense and suspicious. Patient denies SI/HI/AVH but noted  responding to nternal stimuli, his thoughts are disorganized at times, he was paranoid and suspicious in the medication , asking numerous questions, hypervigilant and he kept ruminating that he wants to leave the medication room and not ake medicine, writer  encouraged him and he eventually took his prescribed medication regimen. Patient was offered support and encouragement, he didn't attend evening group, he paced the unit for awhile and he eventually went to bed, 15 minutes safety checks maintained will continue to monitor.

## 2019-01-08 NOTE — Tx Team (Signed)
Interdisciplinary Treatment and Diagnostic Plan Update  01/08/2019 Time of Session: 8:30 AM [name] [name] MRN: [id number]  Principal Diagnosis: Schizoaffective disorder, bipolar type (HCC)  Secondary Diagnoses: Principal Problem:   Schizoaffective disorder, bipolar type (HCC) Active Problems:   Aggressive behavior of adolescent   Noncompliance   Hypertension   Elevated CPK   Current Medications:  Current Facility-Administered Medications  Medication Dose Route Frequency Provider Last Rate Last Dose  . acetaminophen (TYLENOL) tablet 650 mg  650 mg Oral Q6H PRN Antonieta Pertlary, Greg Lawson, MD      . alum & mag hydroxide-simeth (MAALOX/MYLANTA) 200-200-20 MG/5ML suspension 30 mL  30 mL Oral Q4H PRN Antonieta Pertlary, Greg Lawson, MD      . divalproex (DEPAKOTE) DR tablet 500 mg  500 mg Oral Q12H Antonieta Pertlary, Greg Lawson, MD   500 mg at 01/08/19 1245  . fluvoxaMINE (LUVOX) tablet 25 mg  25 mg Oral QHS Antonieta Pertlary, Greg Lawson, MD   25 mg at 01/07/19 2151  . hydrOXYzine (ATARAX/VISTARIL) tablet 25 mg  25 mg Oral Q6H PRN Antonieta Pertlary, Greg Lawson, MD   25 mg at 01/07/19 2151  . magnesium hydroxide (MILK OF MAGNESIA) suspension 15 mL  15 mL Oral Daily PRN Antonieta Pertlary, Greg Lawson, MD      . nicotine (NICODERM CQ - dosed in mg/24 hr) patch 7 mg  7 mg Transdermal Daily [name], [name], MD   7 mg at 01/07/19 1800  . paliperidone (INVEGA) 24 hr tablet 9 mg  9 mg Oral Daily [name], [name], MD   9 mg at 01/08/19 1245  . traZODone (DESYREL) tablet 100 mg  100 mg Oral QHS PRN Antonieta Pertlary, Greg Lawson, MD   100 mg at 01/07/19 2151   PTA Medications: Medications Prior to Admission  Medication Sig Dispense Refill Last Dose  . benztropine (COGENTIN) 0.5 MG tablet Take 0.5 mg by mouth 2 (two) times daily.   Past Month at Unknown time  . cloZAPine (CLOZARIL) 100 MG tablet Take 3 tablets (300 mg total) by mouth at bedtime. 90 tablet 1` Past Month at Unknown time  . divalproex (DEPAKOTE) 500 MG DR tablet Take 1 tablet (500 mg total) by mouth every 12  (twelve) hours. 60 tablet 1 Past Month at Unknown time  . fluvoxaMINE (LUVOX) 25 MG tablet Take 1 tablet (25 mg total) by mouth at bedtime. 30 tablet 1 Past Month at Unknown time  . metFORMIN (GLUCOPHAGE) 500 MG tablet Take 1 tablet (500 mg total) by mouth daily with breakfast. 30 tablet 1 Past Month at Unknown time  . metoprolol tartrate (LOPRESSOR) 25 MG tablet Take 1 tablet (25 mg total) by mouth 2 (two) times daily. 60 tablet 1 Past Month at Unknown time  . traZODone (DESYREL) 100 MG tablet Take 100 mg by mouth at bedtime.   prn at prn    Patient Stressors: Financial difficulties Medication change or noncompliance  Patient Strengths: Motivation for treatment/growth Supportive family/friends  Treatment Modalities: Medication Management, Group therapy, Case management,  1 to 1 session with clinician, Psychoeducation, Recreational therapy.   Physician Treatment Plan for Primary Diagnosis: Schizoaffective disorder, bipolar type (HCC) Long Term Goal(s): Improvement in symptoms so as ready for discharge Improvement in symptoms so as ready for discharge   Short Term Goals: Ability to identify changes in lifestyle to reduce recurrence of condition will improve Ability to verbalize feelings will improve Ability to demonstrate self-control will improve Ability to identify and develop effective coping behaviors will improve Ability to maintain clinical measurements within  normal limits will improve Compliance with prescribed medications will improve Ability to identify changes in lifestyle to reduce recurrence of condition will improve Ability to verbalize feelings will improve Ability to demonstrate self-control will improve Ability to identify and develop effective coping behaviors will improve Ability to maintain clinical measurements within normal limits will improve Compliance with prescribed medications will improve  Medication Management: Evaluate patient's response, side effects,  and tolerance of medication regimen.  Therapeutic Interventions: 1 to 1 sessions, Unit Group sessions and Medication administration.  Evaluation of Outcomes: Progressing  Physician Treatment Plan for Secondary Diagnosis: Principal Problem:   Schizoaffective disorder, bipolar type ([place]) Active Problems:   Aggressive behavior of adolescent   Noncompliance   Hypertension   Elevated CPK  Long Term Goal(s): Improvement in symptoms so as ready for discharge Improvement in symptoms so as ready for discharge   Short Term Goals: Ability to identify changes in lifestyle to reduce recurrence of condition will improve Ability to verbalize feelings will improve Ability to demonstrate self-control will improve Ability to identify and develop effective coping behaviors will improve Ability to maintain clinical measurements within normal limits will improve Compliance with prescribed medications will improve Ability to identify changes in lifestyle to reduce recurrence of condition will improve Ability to verbalize feelings will improve Ability to demonstrate self-control will improve Ability to identify and develop effective coping behaviors will improve Ability to maintain clinical measurements within normal limits will improve Compliance with prescribed medications will improve     Medication Management: Evaluate patient's response, side effects, and tolerance of medication regimen.  Therapeutic Interventions: 1 to 1 sessions, Unit Group sessions and Medication administration.  Evaluation of Outcomes: Progressing   RN Treatment Plan for Primary Diagnosis: Schizoaffective disorder, bipolar type ([place]) Long Term Goal(s): Knowledge of disease and therapeutic regimen to maintain health will improve  Short Term Goals: Ability to participate in decision making will improve, Ability to verbalize feelings will improve, Ability to identify and develop effective coping behaviors will improve and  Compliance with prescribed medications will improve  Medication Management: RN will administer medications as ordered by provider, will assess and evaluate patient's response and provide education to patient for prescribed medication. RN will report any adverse and/or side effects to prescribing provider.  Therapeutic Interventions: 1 on 1 counseling sessions, Psychoeducation, Medication administration, Evaluate responses to treatment, Monitor vital signs and CBGs as ordered, Perform/monitor CIWA, COWS, AIMS and Fall Risk screenings as ordered, Perform wound care treatments as ordered.  Evaluation of Outcomes: Progressing   LCSW Treatment Plan for Primary Diagnosis: Schizoaffective disorder, bipolar type ([place]) Long Term Goal(s): Safe transition to appropriate next level of care at discharge, Engage patient in therapeutic group addressing interpersonal concerns.  Short Term Goals: Engage patient in aftercare planning with referrals and resources, Increase emotional regulation, Facilitate acceptance of mental health diagnosis and concerns and Increase skills for wellness and recovery  Therapeutic Interventions: Assess for all discharge needs, 1 to 1 time with Social worker, Explore available resources and support systems, Assess for adequacy in community support network, Educate family and significant other(s) on suicide prevention, Complete Psychosocial Assessment, Interpersonal group therapy.  Evaluation of Outcomes: Progressing   Progress in Treatment: Attending groups: No. Participating in groups: No. Taking medication as prescribed: No. Toleration medication: Yes. Family/Significant other contact made: No, will contact:  pt declined collateral contact Patient understands diagnosis: Yes. Discussing patient identified problems/goals with staff: Yes. Medical problems stabilized or resolved: Yes. Denies suicidal/homicidal ideation: Yes. Issues/concerns per patient self-inventory:  No. Other: N/A  New problem(s) identified: No, Describe:  none  New Short Term/Long Term Goal(s): medication management for mood stabilization; development of comprehensive mental wellness/sobriety plan.  Patient Goals:  "to bet better, where to go from this, and not have to come back again"  Discharge Plan or Barriers: SPE pamphlet, Mobile Crisis information, and AA/NA information provided to patient for additional community support and resources. Pt is agreeable to follow up with his current [facility] team. Update 01/08/2019:  Per reports in progression, patient has begun to decline to take his medications.  Pt remains isolative and does not attend group or the milieu of the unit.   Reason for Continuation of Hospitalization: Depression Medication stabilization   Estimated Length of Stay: TBD  Recreational Therapy: Patient Stressors: N/A  Patient Goal: Patient will engage in groups without prompting or encouragement from LRT x3 group sessions within 5 recreation therapy group sessions  Attendees: Patient:  01/08/2019 12:50 PM  Physician: Dr Toni Amendlapacs MD 01/08/2019 12:50 PM  Nursing:  01/08/2019 12:50 PM  RN Care Manager: 01/08/2019 12:50 PM  Social Worker: Penni HomansMichaela Darcee Dekker LCSW 01/08/2019 12:50 PM  Recreational Therapist:  01/08/2019 12:50 PM  Other:  01/08/2019 12:50 PM  Other:  01/08/2019 12:50 PM  Other: 01/08/2019 12:50 PM    Scribe for Treatment Team: Harden MoMichaela J Savvy Peeters, LCSW 01/08/2019 12:50 PM

## 2019-01-08 NOTE — [facility] Group Notes (Signed)

## 2019-01-08 NOTE — Progress Notes (Signed)
Patient is alert and oriented X 3, denies SI, HI and AVH. Change in behavior today, patient seen standing in his room in corner staring at the wall. RN asked patient if he was seeing anything or hearing voices, patient sighed and stated " NO" Patient would walk out of his room, take 2-3 steps and walk back in repetitiously about 5 times. Patient initially refused morning medications but later in the morning agreed to take medication. Patient did not eat breakfast this morning or lunch which is a big change from previous days. Safety checks to continue Q 15 minutes.

## 2019-01-08 NOTE — Progress Notes (Signed)
Recreation Therapy Notes   Date: 01/08/2019  Time: 9:30 am   Location: Craft room   Behavioral response: N/A   Intervention Topic: Relaxation  Discussion/Intervention: Patient did not attend group.   Clinical Observations/Feedback:  Patient did not attend group.   Ameshia Pewitt LRT/CTRS        Micael Barb 01/08/2019 11:25 AM 

## 2019-01-08 NOTE — Plan of Care (Signed)
  Problem: Education: Goal: Will be free of psychotic symptoms Outcome: Not Progressing  Patient appears responding to internal stimuli , often reoriented as needed.

## 2019-01-08 NOTE — Progress Notes (Signed)
[facility]  01/08/2019 2:09 PM [name] [name]  MRN:  [id number] Subjective: Patient seen chart reviewed.  Patient was wrapped up in a cocoon again today when I went to see him after lunchtime.  Wide-awake.  Claims to not be thinking about anything.  Denies any symptoms.  Completely inactive on the unit today.  Nursing tells me that he refused his medication this morning.  He has no reason for this that I can discern from him today.  Denies suicidal thoughts.  Asks me when he will be discharged and seems not to understand when I explained that he is far too symptomatic to be discharged.  Physically no obvious problem.  Vital stable Principal Problem: Schizoaffective disorder, bipolar type (HCC) Diagnosis: Principal Problem:   Schizoaffective disorder, bipolar type (HCC) Active Problems:   Aggressive behavior of adolescent   Noncompliance   Hypertension   Elevated CPK  Total Time spent with patient: 30 minutes  Past Psychiatric History: Multiple hospitalizations due to noncompliance with medicine  Past Medical History:  Past Medical History:  Diagnosis Date  . Asthma   . Depression   . Psychosis [facility](HCC)     Past Surgical History:  Procedure Laterality Date  . BACK SURGERY     Family History: History reviewed. No pertinent family history. Family Psychiatric  History: See previous Social History:  Social History   Substance and Sexual Activity  Alcohol Use No     Social History   Substance and Sexual Activity  Drug Use Yes  . Types: Marijuana    Social History   Socioeconomic History  . Marital status: Single    Spouse name: Not on file  . Number of children: Not on file  . Years of education: Not on file  . Highest education level: Not on file  Occupational History  . Not on file  Social Needs  . Financial resource strain: Not on file  . Food insecurity    Worry: Not on file    Inability: Not on file  . Transportation needs    Medical: Not on file     Non-medical: Not on file  Tobacco Use  . Smoking status: Never Smoker  . Smokeless tobacco: Never Used  Substance and Sexual Activity  . Alcohol use: No  . Drug use: Yes    Types: Marijuana  . Sexual activity: Not Currently  Lifestyle  . Physical activity    Days per week: Not on file    Minutes per session: Not on file  . Stress: Not on file  Relationships  . Social Musicianconnections    Talks on phone: Not on file    Gets together: Not on file    Attends religious service: Not on file    Active member of club or organization: Not on file    Attends meetings of clubs or organizations: Not on file    Relationship status: Not on file  Other Topics Concern  . Not on file  Social History Narrative  . Not on file   Additional Social History:                         Sleep: Fair  Appetite:  Fair  Current Medications: Current Facility-Administered Medications  Medication Dose Route Frequency Provider Last Rate Last Dose  . acetaminophen (TYLENOL) tablet 650 mg  650 mg Oral Q6H PRN Antonieta Pertlary, Greg Lawson, MD      . alum & mag hydroxide-simeth (MAALOX/MYLANTA)  200-200-20 MG/5ML suspension 30 mL  30 mL Oral Q4H PRN Antonieta Pertlary, Greg Lawson, MD      . divalproex (DEPAKOTE) DR tablet 500 mg  500 mg Oral Q12H Antonieta Pertlary, Greg Lawson, MD   500 mg at 01/08/19 1245  . fluvoxaMINE (LUVOX) tablet 25 mg  25 mg Oral QHS Antonieta Pertlary, Greg Lawson, MD   25 mg at 01/07/19 2151  . hydrOXYzine (ATARAX/VISTARIL) tablet 25 mg  25 mg Oral Q6H PRN Antonieta Pertlary, Greg Lawson, MD   25 mg at 01/07/19 2151  . magnesium hydroxide (MILK OF MAGNESIA) suspension 15 mL  15 mL Oral Daily PRN Antonieta Pertlary, Greg Lawson, MD      . nicotine (NICODERM CQ - dosed in mg/24 hr) patch 7 mg  7 mg Transdermal Daily [name], [name], MD   7 mg at 01/07/19 1800  . paliperidone (INVEGA) 24 hr tablet 9 mg  9 mg Oral Daily [name], [name], MD   9 mg at 01/08/19 1245  . traZODone (DESYREL) tablet 100 mg  100 mg Oral QHS PRN Antonieta Pertlary, Greg Lawson, MD   100 mg at  01/07/19 2151    Lab Results: No results found for this or any previous visit (from the past 48 hour(s)).  Blood Alcohol level:  Lab Results  Component Value Date   ETH <10 01/01/2019   ETH <10 08/02/2018    Metabolic Disorder Labs: Lab Results  Component Value Date   HGBA1C 5.1 08/10/2018   MPG 99.67 08/10/2018   MPG 93.93 03/08/2018   No results found for: PROLACTIN Lab Results  Component Value Date   CHOL 175 08/10/2018   TRIG 103 08/10/2018   HDL 46 08/10/2018   CHOLHDL 3.8 08/10/2018   VLDL 21 08/10/2018   LDLCALC 108 (H) 08/10/2018   LDLCALC 168 (H) 03/08/2018    Physical Findings: AIMS:  , ,  ,  ,    CIWA:    COWS:     Musculoskeletal: Strength & Muscle Tone: within normal limits Gait & Station: normal Patient leans: N/A  Psychiatric Specialty Exam: Physical Exam  Nursing note and vitals reviewed. Constitutional: He appears well-developed and well-nourished.  HENT:  Head: Normocephalic and atraumatic.  Eyes: Pupils are equal, round, and reactive to light. Conjunctivae are normal.  Neck: Normal range of motion.  Cardiovascular: Regular rhythm and normal heart sounds.  Respiratory: Effort normal.  GI: Soft.  Musculoskeletal: Normal range of motion.  Neurological: He is alert.  Skin: Skin is warm and dry.  Psychiatric: His affect is blunt. His speech is delayed. He is slowed and withdrawn. Thought content is paranoid. Cognition and memory are impaired. He expresses inappropriate judgment.    Review of Systems  Constitutional: Negative.   HENT: Negative.   Eyes: Negative.   Respiratory: Negative.   Cardiovascular: Negative.   Gastrointestinal: Negative.   Musculoskeletal: Negative.   Skin: Negative.   Neurological: Negative.   Psychiatric/Behavioral: Negative.     Blood pressure 124/80, pulse 83, temperature 98.2 F (36.8 C), temperature source Oral, resp. rate 18, height 6' (1.829 m), weight 94.3 kg, SpO2 99 %.Body mass index is 28.2 kg/m.   General Appearance: Casual  Eye Contact:  Minimal  Speech:  Slow  Volume:  Decreased  Mood:  Euthymic  Affect:  Flat  Thought Process:  Disorganized  Orientation:  Negative  Thought Content:  Illogical, Paranoid Ideation and Rumination  Suicidal Thoughts:  No  Homicidal Thoughts:  No  Memory:  Immediate;   Fair Recent;   Poor  Remote;   Poor  Judgement:  Poor  Insight:  Lacking  Psychomotor Activity:  Decreased  Concentration:  Concentration: Poor  Recall:  Poor  Fund of Knowledge:  Fair  Language:  Fair  Akathisia:  No  Handed:  Right  AIMS (if indicated):     Assets:  Catering manager Housing Physical Health Social Support  ADL's:  Impaired  Cognition:  Impaired,  Mild  Sleep:  Number of Hours: 3     Treatment Plan Summary: Daily contact with patient to assess and evaluate symptoms and progress in treatment, Medication management and Plan Very sad young man.  Responds to medicine when he takes it but so frequently refuses medicine.  I am trying to make it clear to him that if he is not compliant with medication we may need to force things.  He really needs to be on medicine to ever get to a condition where he can participate in normal life and even have a hope for a discharge plan.  Requested 21 days of inpatient commitment.  For now we will continue trying to push the [facility] and hope that that can lead [place] to a long-acting injectable shot.  Alethia Berthold, MD 01/08/2019, 2:09 PM

## 2019-01-09 NOTE — Progress Notes (Signed)
Patient alert and oriented x 3 with periods of confusion to situation, affect is flat but assertive he is unwilling to participate in treatment plan he appears anxious, and suspicious, he presently denies SI/HI/AVH was noted pacing the unit acting bizarre and responding to internal stimuli. Patient's thoughts are disorganized and incoherent, he was noted paranoid and hyper vigilant  in the medication room, patient eventually was complaint with medication. Patient was offered support and encouragement, he didn't attend evening group,  15 minutes safety checks maintained will continue to monitor.

## 2019-01-09 NOTE — Progress Notes (Signed)
[facility]  01/09/2019 12:33 PM [name] [name]  MRN:  [id number] Subjective: Patient seen and chart reviewed.  Patient still spends most of his time in bed.  Makes little eye contact.  When I tried to start a conversation with him about what kind of things he enjoyed doing at home he became paranoid and declined to tell me.  A little more energetic.  Does eat a little bit better.  Still however stays very isolated.  Took his medicine today.  I may start the in CapulinVega injection as early as tomorrow just to see if we can get things moving more quickly.  Encourage patient Principal Problem: Schizoaffective disorder, bipolar type (HCC) Diagnosis: Principal Problem:   Schizoaffective disorder, bipolar type (HCC) Active Problems:   Aggressive behavior of adolescent   Noncompliance   Hypertension   Elevated CPK  Total Time spent with patient: 30 minutes  Past Psychiatric History: Schizophrenia getting worse over years  Past Medical History:  Past Medical History:  Diagnosis Date  . Asthma   . Depression   . Psychosis [facility](HCC)     Past Surgical History:  Procedure Laterality Date  . BACK SURGERY     Family History: History reviewed. No pertinent family history. Family Psychiatric  History: See previous Social History:  Social History   Substance and Sexual Activity  Alcohol Use No     Social History   Substance and Sexual Activity  Drug Use Yes  . Types: Marijuana    Social History   Socioeconomic History  . Marital status: Single    Spouse name: Not on file  . Number of children: Not on file  . Years of education: Not on file  . Highest education level: Not on file  Occupational History  . Not on file  Social Needs  . Financial resource strain: Not on file  . Food insecurity    Worry: Not on file    Inability: Not on file  . Transportation needs    Medical: Not on file    Non-medical: Not on file  Tobacco Use  . Smoking status: Never Smoker  . Smokeless  tobacco: Never Used  Substance and Sexual Activity  . Alcohol use: No  . Drug use: Yes    Types: Marijuana  . Sexual activity: Not Currently  Lifestyle  . Physical activity    Days per week: Not on file    Minutes per session: Not on file  . Stress: Not on file  Relationships  . Social Musicianconnections    Talks on phone: Not on file    Gets together: Not on file    Attends religious service: Not on file    Active member of club or organization: Not on file    Attends meetings of clubs or organizations: Not on file    Relationship status: Not on file  Other Topics Concern  . Not on file  Social History Narrative  . Not on file   Additional Social History:                         Sleep: Good  Appetite:  Good  Current Medications: Current Facility-Administered Medications  Medication Dose Route Frequency Provider Last Rate Last Dose  . acetaminophen (TYLENOL) tablet 650 mg  650 mg Oral Q6H PRN Antonieta Pertlary, Greg Lawson, MD      . alum & mag hydroxide-simeth (MAALOX/MYLANTA) 200-200-20 MG/5ML suspension 30 mL  30 mL Oral Q4H  PRN Antonieta Pertlary, Greg Lawson, MD      . divalproex (DEPAKOTE) DR tablet 500 mg  500 mg Oral Q12H Antonieta Pertlary, Greg Lawson, MD   500 mg at 01/09/19 0827  . fluvoxaMINE (LUVOX) tablet 25 mg  25 mg Oral QHS Antonieta Pertlary, Greg Lawson, MD   25 mg at 01/08/19 2206  . hydrOXYzine (ATARAX/VISTARIL) tablet 25 mg  25 mg Oral Q6H PRN Antonieta Pertlary, Greg Lawson, MD   25 mg at 01/07/19 2151  . magnesium hydroxide (MILK OF MAGNESIA) suspension 15 mL  15 mL Oral Daily PRN Antonieta Pertlary, Greg Lawson, MD      . nicotine (NICODERM CQ - dosed in mg/24 hr) patch 7 mg  7 mg Transdermal Daily Lyndell Gillyard, [name], MD   7 mg at 01/09/19 0827  . paliperidone (INVEGA) 24 hr tablet 9 mg  9 mg Oral Daily Lanell Dubie, [name], MD   9 mg at 01/09/19 0827  . traZODone (DESYREL) tablet 100 mg  100 mg Oral QHS PRN Antonieta Pertlary, Greg Lawson, MD   100 mg at 01/07/19 2151    Lab Results: No results found for this or any previous visit (from  the past 48 hour(s)).  Blood Alcohol level:  Lab Results  Component Value Date   ETH <10 01/01/2019   ETH <10 08/02/2018    Metabolic Disorder Labs: Lab Results  Component Value Date   HGBA1C 5.1 08/10/2018   MPG 99.67 08/10/2018   MPG 93.93 03/08/2018   No results found for: PROLACTIN Lab Results  Component Value Date   CHOL 175 08/10/2018   TRIG 103 08/10/2018   HDL 46 08/10/2018   CHOLHDL 3.8 08/10/2018   VLDL 21 08/10/2018   LDLCALC 108 (H) 08/10/2018   LDLCALC 168 (H) 03/08/2018    Physical Findings: AIMS:  , ,  ,  ,    CIWA:    COWS:     Musculoskeletal: Strength & Muscle Tone: within normal limits Gait & Station: normal Patient leans: N/A  Psychiatric Specialty Exam: Physical Exam  Nursing note and vitals reviewed. Constitutional: He appears well-developed and well-nourished.  HENT:  Head: Normocephalic and atraumatic.  Eyes: Pupils are equal, round, and reactive to light. Conjunctivae are normal.  Neck: Normal range of motion.  Cardiovascular: Regular rhythm and normal heart sounds.  Respiratory: Effort normal. No respiratory distress.  GI: Soft.  Musculoskeletal: Normal range of motion.  Neurological: He is alert.  Skin: Skin is warm and dry.  Psychiatric: His affect is blunt. His speech is delayed. He is slowed and withdrawn. Cognition and memory are impaired. He expresses inappropriate judgment. He expresses no homicidal and no suicidal ideation.    Review of Systems  Constitutional: Negative.   HENT: Negative.   Eyes: Negative.   Respiratory: Negative.   Cardiovascular: Negative.   Gastrointestinal: Negative.   Musculoskeletal: Negative.   Skin: Negative.   Neurological: Negative.   Psychiatric/Behavioral: Positive for hallucinations and memory loss. Negative for depression, substance abuse and suicidal ideas.    Blood pressure 128/81, pulse 87, temperature 97.7 F (36.5 C), temperature source Oral, resp. rate 18, height 6' (1.829 m),  weight 94.3 kg, SpO2 100 %.Body mass index is 28.2 kg/m.  General Appearance: Casual  Eye Contact:  Fair  Speech:  Blocked  Volume:  Decreased  Mood:  Euthymic  Affect:  Flat  Thought Process:  Disorganized  Orientation:  Full (Time, Place, and Person)  Thought Content:  Paranoid Ideation  Suicidal Thoughts:  No  Homicidal Thoughts:  No  Memory:  Immediate;   Fair Recent;   Fair Remote;   Fair  Judgement:  Poor  Insight:  Shallow  Psychomotor Activity:  Decreased  Concentration:  Concentration: Poor  Recall:  Poor  Fund of Knowledge:  Poor  Language:  Poor  Akathisia:  No  Handed:  Right  AIMS (if indicated):     Assets:  Physical Health Social Support  ADL's:  Impaired  Cognition:  Impaired,  Mild  Sleep:  Number of Hours: 2.18     Treatment Plan Summary: Daily contact with patient to assess and evaluate symptoms and progress in treatment, Medication management and Plan Psychoeducation as usual convincing him that he needs to take his medicine and try and push himself to be more active.  If he is not getting better pretty quickly with the Invega we may have to go back to the clozapine which was effective in the past.  Patient at this point still completely impaired unable to take care of himself not able to be discharged because of his extremely poor self-care and judgment.  Alethia Berthold, MD 01/09/2019, 12:33 PM

## 2019-01-09 NOTE — Progress Notes (Signed)
D: Pt. During assessments denies everything, but visibly appears to be responding to internal stimuli. Pt. Eye contact continues to be avertive and poor. Pt. Endorses a normal mood. Pt. Fixated on discharge. Pt. Appears to present with though blocking.   A: Q x 15 minute observation checks to be completed for safety. Patient was provided with education.  Patient was given/offered medications per orders. Patient  was encouraged to attend groups, participate in unit activities and continue with plan of care for the day. Pt. Chart and plans of care reviewed. Pt. Given support and encouragement.   R: Patient is complaint with medications and breakfast this morning with direction and encouragement from this Probation officer and staff. Pt. Observed eating good. Pt. Besides breakfast is isolative and withdrawn to room resting. Pt. Interactions with peers absent and staff interactions minimal.

## 2019-01-09 NOTE — [facility] Group Notes (Signed)
LCSW Group Therapy Note  01/09/2019  1:00 PM   Type of Therapy and Topic:  Group Therapy:  Trust and Honesty   Participation Level:  Did Not Attend   Description of Group:    In this group patients will be asked to explore the value of being honest.  Patients will be guided to discuss their thoughts, feelings, and behaviors related to honesty and trusting in others. Patients will process together how trust and honesty relate to forming relationships with peers, family members, and self. Each patient will be challenged to identify and express feelings of being vulnerable. Patients will discuss reasons why people are dishonest and identify alternative outcomes if one was truthful (to self or others). This group will be process-oriented, with patients participating in exploration of their own experiences, giving and receiving support, and processing challenge from other group members.   Therapeutic Goals: 1. Patient will identify why honesty is important to relationships and how honesty overall affects relationships.  2. Patient will identify a situation where they lied or were lied too and the  feelings, thought process, and behaviors surrounding the situation 3. Patient will identify the meaning of being vulnerable, how that feels, and how that correlates to being honest with self and others. 4. Patient will identify situations where they could have told the truth, but instead lied and explain reasons of dishonesty.   Summary of Patient Progress:  X    Therapeutic Modalities:   Cognitive Behavioral Therapy Solution Focused Therapy Motivational Interviewing Brief Therapy  Assunta Curtis, MSW, LCSW 01/09/2019 12:25 PM

## 2019-01-09 NOTE — Plan of Care (Signed)
Pt. Is complaint with medications this morning. Pt. Observed eating good with direction and encouragement. Pt. Able to remain safe while on the unit. Pt. Continues to present appearing to be responding to internal stimuli.    Problem: Education: Goal: Will be free of psychotic symptoms Outcome: Not Progressing   Problem: Health Behavior/Discharge Planning: Goal: Compliance with prescribed medication regimen will improve Outcome: Progressing   Problem: Nutritional: Goal: Ability to achieve adequate nutritional intake will improve Outcome: Progressing   Problem: Safety: Goal: Ability to remain free from injury will improve Outcome: Progressing

## 2019-01-10 NOTE — Plan of Care (Signed)
  Problem: Education: Goal: Will be free of psychotic symptoms Outcome: Not Progressing Goal: Knowledge of the prescribed therapeutic regimen will improve Outcome: Not Progressing  D: Patient has been pacing the halls. Asked me to tell Dr. Weber Cooks that he is better and ready to go home. Refused his his medication but took his Depakote. Has been pacing the halls all night. Appears internally preoccupied but denies AV hallucinations. Denies SI and HI. Contracts for safety.  A: Continue to monitor for safety. R: safety maintained.

## 2019-01-10 NOTE — Plan of Care (Signed)
D- Patient alert and oriented. Patient presents in a pleasant, but cautious mood on assessment stating to this writer "everything's fine" when this writer asked to take another BP on him. Patient has also been refusing his medications because he feels as if he does not need them now. Patient denies any signs/symptoms of depression/anxiety to this Probation officer. Patient also denies SI, HI, AVH, and pain at this time. Patient had no stated goals for today. Patient has been preoccupied with discharge and when the MD is going to let him go home.  A- Support and encouragement provided.  Routine safety checks conducted every 15 minutes.  Patient informed to notify staff with problems or concerns.  R- Patient contracts for safety at this time. Patient non-compliant with medications. Patient receptive, calm, and cooperative. Patient interacts well with others on the unit.  Patient remains safe at this time.  Problem: Activity: Goal: Will verbalize the importance of balancing activity with adequate rest periods Outcome: Not Progressing   Problem: Education: Goal: Will be free of psychotic symptoms Outcome: Not Progressing Goal: Knowledge of the prescribed therapeutic regimen will improve Outcome: Not Progressing   Problem: Coping: Goal: Coping ability will improve Outcome: Not Progressing Goal: Will verbalize feelings Outcome: Not Progressing   Problem: Health Behavior/Discharge Planning: Goal: Compliance with prescribed medication regimen will improve Outcome: Not Progressing   Problem: Nutritional: Goal: Ability to achieve adequate nutritional intake will improve Outcome: Not Progressing   Problem: Role Relationship: Goal: Ability to communicate needs accurately will improve Outcome: Not Progressing Goal: Ability to interact with others will improve Outcome: Not Progressing   Problem: Safety: Goal: Ability to redirect hostility and anger into socially appropriate behaviors will  improve Outcome: Not Progressing Goal: Ability to remain free from injury will improve Outcome: Not Progressing   Problem: Self-Care: Goal: Ability to participate in self-care as condition permits will improve Outcome: Not Progressing   Problem: Self-Concept: Goal: Will verbalize positive feelings about self Outcome: Not Progressing

## 2019-01-10 NOTE — Progress Notes (Signed)
[facility]  01/10/2019 12:05 PM Emeline GinsMohmed A Pulliam  MRN:  [id number] Subjective: Follow-up for this patient with schizoaffective disorder.  Today he once again refused his medicine telling his nurses that he did not think he needed it.  Patient continues to stay in bed pretty much 24 hours a day except when he is eating.  He is usually awake when I come into the room but has little to say for himself.  Cannot articulate any thoughts on his mind.  He cannot engage in any kind of back-and-forth conversation.  I informed the patient in the most direct but polite terms today that if he did not take medicine he would not be getting discharged from the hospital because he has a severe illness which is only going to get better with medication.  I advised him that I would be happy to engage in conversation with him and if he really preferred to go back to the clozapine and was willing to tell me that he wanted to take it regularly I would restart the clozapine.  Patient did not have anything particular to say to that.  So I explained to him the reason again for trying to switch to in [place].  I think that if we cannot get him a little more stable we will probably be in the position of forcing a long-acting injection to get started. Principal Problem: Schizoaffective disorder, bipolar type (HCC) Diagnosis: Principal Problem:   Schizoaffective disorder, bipolar type (HCC) Active Problems:   Aggressive behavior of adolescent   Noncompliance   Hypertension   Elevated CPK  Total Time spent with patient: 30 minutes  Past Psychiatric History: History of schizophrenia getting worse and worse over the last couple years because of his noncompliance  Past Medical History:  Past Medical History:  Diagnosis Date  . Asthma   . Depression   . Psychosis [facility](HCC)     Past Surgical History:  Procedure Laterality Date  . BACK SURGERY     Family History: History reviewed. No pertinent family history. Family  Psychiatric  History: None reported Social History:  Social History   Substance and Sexual Activity  Alcohol Use No     Social History   Substance and Sexual Activity  Drug Use Yes  . Types: Marijuana    Social History   Socioeconomic History  . Marital status: Single    Spouse name: Not on file  . Number of children: Not on file  . Years of education: Not on file  . Highest education level: Not on file  Occupational History  . Not on file  Social Needs  . Financial resource strain: Not on file  . Food insecurity    Worry: Not on file    Inability: Not on file  . Transportation needs    Medical: Not on file    Non-medical: Not on file  Tobacco Use  . Smoking status: Never Smoker  . Smokeless tobacco: Never Used  Substance and Sexual Activity  . Alcohol use: No  . Drug use: Yes    Types: Marijuana  . Sexual activity: Not Currently  Lifestyle  . Physical activity    Days per week: Not on file    Minutes per session: Not on file  . Stress: Not on file  Relationships  . Social Musicianconnections    Talks on phone: Not on file    Gets together: Not on file    Attends religious service: Not on file  Active member of club or organization: Not on file    Attends meetings of clubs or organizations: Not on file    Relationship status: Not on file  Other Topics Concern  . Not on file  Social History Narrative  . Not on file   Additional Social History:                         Sleep: Fair  Appetite:  Fair  Current Medications: Current Facility-Administered Medications  Medication Dose Route Frequency Provider Last Rate Last Dose  . acetaminophen (TYLENOL) tablet 650 mg  650 mg Oral Q6H PRN Sharma Covert, MD      . alum & mag hydroxide-simeth (MAALOX/MYLANTA) 200-200-20 MG/5ML suspension 30 mL  30 mL Oral Q4H PRN Sharma Covert, MD      . divalproex (DEPAKOTE) DR tablet 500 mg  500 mg Oral Q12H Sharma Covert, MD   500 mg at 01/09/19 1934  .  fluvoxaMINE (LUVOX) tablet 25 mg  25 mg Oral QHS Sharma Covert, MD   25 mg at 01/08/19 2206  . hydrOXYzine (ATARAX/VISTARIL) tablet 25 mg  25 mg Oral Q6H PRN Sharma Covert, MD   25 mg at 01/07/19 2151  . magnesium hydroxide (MILK OF MAGNESIA) suspension 15 mL  15 mL Oral Daily PRN Sharma Covert, MD      . nicotine (NICODERM CQ - dosed in mg/24 hr) patch 7 mg  7 mg Transdermal Daily Jamerion Cabello, Madie Reno, MD   7 mg at 01/09/19 0827  . paliperidone (INVEGA) 24 hr tablet 9 mg  9 mg Oral Daily Naveah Brave, Madie Reno, MD   9 mg at 01/09/19 0827  . traZODone (DESYREL) tablet 100 mg  100 mg Oral QHS PRN Sharma Covert, MD   100 mg at 01/07/19 2151    Lab Results: No results found for this or any previous visit (from the past 41 hour(s)).  Blood Alcohol level:  Lab Results  Component Value Date   ETH <10 01/01/2019   ETH <10 27/12/2374    Metabolic Disorder Labs: Lab Results  Component Value Date   HGBA1C 5.1 08/10/2018   MPG 99.67 08/10/2018   MPG 93.93 03/08/2018   No results found for: PROLACTIN Lab Results  Component Value Date   CHOL 175 08/10/2018   TRIG 103 08/10/2018   HDL 46 08/10/2018   CHOLHDL 3.8 08/10/2018   VLDL 21 08/10/2018   LDLCALC 108 (H) 08/10/2018   LDLCALC 168 (H) 03/08/2018    Physical Findings: AIMS:  , ,  ,  ,    CIWA:    COWS:     Musculoskeletal: Strength & Muscle Tone: within normal limits Gait & Station: normal Patient leans: N/A  Psychiatric Specialty Exam: Physical Exam  Nursing note and vitals reviewed. Constitutional: He appears well-developed and well-nourished.  HENT:  Head: Normocephalic and atraumatic.  Eyes: Pupils are equal, round, and reactive to light. Conjunctivae are normal.  Neck: Normal range of motion.  Cardiovascular: Regular rhythm and normal heart sounds.  Respiratory: Effort normal. No respiratory distress.  GI: Soft.  Musculoskeletal: Normal range of motion.  Neurological: He is alert.  Skin: Skin is warm and  dry.  Psychiatric: His affect is blunt. His speech is delayed. He is slowed and withdrawn. Thought content is paranoid and delusional. Cognition and memory are impaired. He expresses inappropriate judgment.    Review of Systems  Constitutional: Negative.   HENT: Negative.  Eyes: Negative.   Respiratory: Negative.   Cardiovascular: Negative.   Gastrointestinal: Negative.   Musculoskeletal: Negative.   Skin: Negative.   Neurological: Negative.   Psychiatric/Behavioral: Negative.     Blood pressure 132/83, pulse (!) 107, temperature 98.3 F (36.8 C), temperature source Oral, resp. rate 18, height 6' (1.829 m), weight 94.3 kg, SpO2 100 %.Body mass index is 28.2 kg/m.  General Appearance: Casual  Eye Contact:  Minimal  Speech:  Slow  Volume:  Decreased  Mood:  Euthymic  Affect:  Flat  Thought Process:  Disorganized  Orientation:  Negative  Thought Content:  Illogical and Paranoid Ideation  Suicidal Thoughts:  No  Homicidal Thoughts:  No  Memory:  Negative  Judgement:  Negative  Insight:  Negative  Psychomotor Activity:  Decreased  Concentration:  Concentration: Fair  Recall:  Poor  Fund of Knowledge:  Poor  Language:  Poor  Akathisia:  No  Handed:  Right  AIMS (if indicated):     Assets:  Housing Physical Health Resilience Social Support  ADL's:  Impaired  Cognition:  Impaired,  Moderate  Sleep:  Number of Hours: 1     Treatment Plan Summary: Daily contact with patient to assess and evaluate symptoms and progress in treatment, Medication management and Plan Doing patient with severe schizophrenia not showing any sign of getting better honestly, I am starting to think that the act team's suggestion about ECT would not be such a bad idea although I am almost certain that not only the patient but his mother would refuse the treatment.  As I mentioned above I gave the patient polite but strict information that he would not be getting any closer to his goal of discharge  without taking medicine so we could treat his illness.  Patient gave a vague affirmative to this so hopefully if we re-presenting the medicine today he will take it.Mordecai Rasmussen.  Chaunda Vandergriff, MD 01/10/2019, 12:05 PM

## 2019-01-10 NOTE — Progress Notes (Signed)
Patient refused his morning medication stating that he doesn't feel like he needs it. This Probation officer notified MD.

## 2019-01-10 NOTE — [facility] Group Notes (Signed)
LCSW Group Therapy Note  01/10/2019 9:09 AM   Type of Therapy and Topic: Group Therapy: Feelings Around Returning Home & Establishing a Supportive Framework and Supporting Oneself When Supports Not Available   Participation Level:  Did Not Attend   Description of Group:  Patients first processed thoughts and feelings about upcoming discharge. These included fears of upcoming changes, lack of change, new living environments, judgements and expectations from others and overall stigma of mental health issues. The group then discussed the definition of a supportive framework, what that looks and feels like, and how do to discern it from an unhealthy non-supportive network. The group identified different types of supports as well as what to do when your family/friends are less than helpful or unavailable   Therapeutic Goals  1. Patient will identify one healthy supportive network that they can use at discharge. 2. Patient will identify one factor of a supportive framework and how to tell it from an unhealthy network. 3. Patient able to identify one coping skill to use when they do not have positive supports from others. 4. Patient will demonstrate ability to communicate their needs through discussion and/or role plays.   Summary of Patient Progress:  x    Therapeutic Modalities Cognitive Behavioral Therapy Motivational Interviewing    Evalina Field, MSW, LCSW Clinical Social Work 01/10/2019 9:09 AM

## 2019-01-10 NOTE — Progress Notes (Signed)
Patient has been pacing the hallways back and forth from the dayroom to his room. This has been his activity since after dinner. Patient sleeps all day and then it seems that he paces the halls all night because of resting all day long.

## 2019-01-10 NOTE — Progress Notes (Signed)
D: Patient has been pacing the halls. Asked me to tell Dr. Weber Cooks that he is better and ready to go home. Refused his his medication but took his Depakote. Has been pacing the halls all night. Appears internally preoccupied but denies AV hallucinations. Denies SI and HI. Contracts for safety.  A: Continue to monitor for safety. R: safety maintained.

## 2019-01-11 MED ORDER — CLOZAPINE 100 MG PO TABS
100.0000 mg | ORAL_TABLET | Freq: Every day | ORAL | Status: DC
  Administered 2019-01-11 – 2019-01-12 (×2): 100 mg via ORAL
  Filled 2019-01-11: qty 1

## 2019-01-11 NOTE — Progress Notes (Signed)
D: Patient refused his HS dose of Luvox, despite encouragement to take it. Compliant with 8pm Depakote. Has been asking for food. Offered snacks, but patient refused and wants a sandwich from the cafeteria instead, but the cafeteria is closed. Denies SI, HI and AVH but appears internally preoccupied. Awake all night, pacing the halls. Offered sleep medication and refused.  A: Continue to monitor for safety. R: Safety maintained.

## 2019-01-11 NOTE — [facility] Group Notes (Signed)
Overcoming Obstacles  01/11/2019 1PM  Type of Therapy and Topic:  Group Therapy:  Overcoming Obstacles  Participation Level:  Did Not Attend    Description of Group:    In this group patients will be encouraged to explore what they see as obstacles to their own wellness and recovery. They will be guided to discuss their thoughts, feelings, and behaviors related to these obstacles. The group will process together ways to cope with barriers, with attention given to specific choices patients can make. Each patient will be challenged to identify changes they are motivated to make in order to overcome their obstacles. This group will be process-oriented, with patients participating in exploration of their own experiences as well as giving and receiving support and challenge from other group members.   Therapeutic Goals: 1. Patient will identify personal and current obstacles as they relate to admission. 2. Patient will identify barriers that currently interfere with their wellness or overcoming obstacles.  3. Patient will identify feelings, thought process and behaviors related to these barriers. 4. Patient will identify two changes they are willing to make to overcome these obstacles:      Summary of Patient Progress     Therapeutic Modalities:   Cognitive Behavioral Therapy Solution Focused Therapy Motivational Interviewing Relapse Prevention Therapy    Fate Galanti, MSW, LCSW 01/11/2019 2:06 PM  

## 2019-01-11 NOTE — Progress Notes (Signed)
Recreation Therapy Notes   Date: 01/11/2019  Time: 9:30 am   Location: Craft room   Behavioral response: N/A   Intervention Topic: Problem Solving  Discussion/Intervention: Patient did not attend group.   Clinical Observations/Feedback:  Patient did not attend group.   Oneill Bais LRT/CTRS         Dallis Czaja 01/11/2019 11:05 AM

## 2019-01-11 NOTE — Progress Notes (Signed)
Patient came to this writer and stated that "I'm ready to take my pills".

## 2019-01-11 NOTE — Plan of Care (Signed)
D- Patient alert and oriented. Patient presents in a pleasant mood on assessment stating that he slept ok last night, however, it was reported that patient paced all night. Patient denies any signs/symptoms of depression and anxiety to this Probation officer. Patient stated that "I just need to get home, I don't feel comfortable here". Patient denies SI, HI, AVH, and pain at this time. Patient had no stated goals for today.  A- Support and encouragement provided.  Routine safety checks conducted every 15 minutes.  Patient informed to notify staff with problems or concerns.  R- Patient contracts for safety at this time. Patient receptive and calm, however, he continues to refuse his medication. Patient interacts well with others on the unit.  Patient remains safe at this time.  Problem: Activity: Goal: Will verbalize the importance of balancing activity with adequate rest periods Outcome: Not Progressing   Problem: Education: Goal: Will be free of psychotic symptoms Outcome: Not Progressing Goal: Knowledge of the prescribed therapeutic regimen will improve Outcome: Not Progressing   Problem: Coping: Goal: Coping ability will improve Outcome: Not Progressing Goal: Will verbalize feelings Outcome: Not Progressing   Problem: Health Behavior/Discharge Planning: Goal: Compliance with prescribed medication regimen will improve Outcome: Not Progressing   Problem: Nutritional: Goal: Ability to achieve adequate nutritional intake will improve Outcome: Not Progressing   Problem: Role Relationship: Goal: Ability to communicate needs accurately will improve Outcome: Not Progressing Goal: Ability to interact with others will improve Outcome: Not Progressing   Problem: Safety: Goal: Ability to redirect hostility and anger into socially appropriate behaviors will improve Outcome: Not Progressing Goal: Ability to remain free from injury will improve Outcome: Not Progressing   Problem: Self-Care: Goal:  Ability to participate in self-care as condition permits will improve Outcome: Not Progressing   Problem: Self-Concept: Goal: Will verbalize positive feelings about self Outcome: Not Progressing

## 2019-01-11 NOTE — Progress Notes (Signed)
[facility]  01/11/2019 5:36 PM [name] [name]  MRN:  [id number] Subjective: Patient seen and chart reviewed.  Once again the patient was in his bed pretty much all day rolled up in a blanket.  Wide-awake when I came in and able to have something like a conversation although his answers tend to be 1 or 2 words and his only topic of conversation he is interested in his when he can be discharged.  Patient does eat adequately.  Does not appear catatonic but has pretty profound social withdrawal.  He denies suicidal ideation.  Denies hallucinations although I am not sure how reliable he is.  He refused his medicines again this morning for no really good reason. Principal Problem: Schizoaffective disorder, bipolar type (HCC) Diagnosis: Principal Problem:   Schizoaffective disorder, bipolar type (HCC) Active Problems:   Aggressive behavior of adolescent   Noncompliance   Hypertension   Elevated CPK  Total Time spent with patient: 30 minutes  Past Psychiatric History: Past history of psychotic illness schizophrenia getting worse over the last couple years.  Extensive history of noncompliance  Past Medical History:  Past Medical History:  Diagnosis Date  . Asthma   . Depression   . Psychosis [facility](HCC)     Past Surgical History:  Procedure Laterality Date  . BACK SURGERY     Family History: History reviewed. No pertinent family history. Family Psychiatric  History: None Social History:  Social History   Substance and Sexual Activity  Alcohol Use No     Social History   Substance and Sexual Activity  Drug Use Yes  . Types: Marijuana    Social History   Socioeconomic History  . Marital status: Single    Spouse name: Not on file  . Number of children: Not on file  . Years of education: Not on file  . Highest education level: Not on file  Occupational History  . Not on file  Social Needs  . Financial resource strain: Not on file  . Food insecurity    Worry: Not on  file    Inability: Not on file  . Transportation needs    Medical: Not on file    Non-medical: Not on file  Tobacco Use  . Smoking status: Never Smoker  . Smokeless tobacco: Never Used  Substance and Sexual Activity  . Alcohol use: No  . Drug use: Yes    Types: Marijuana  . Sexual activity: Not Currently  Lifestyle  . Physical activity    Days per week: Not on file    Minutes per session: Not on file  . Stress: Not on file  Relationships  . Social Musicianconnections    Talks on phone: Not on file    Gets together: Not on file    Attends religious service: Not on file    Active member of club or organization: Not on file    Attends meetings of clubs or organizations: Not on file    Relationship status: Not on file  Other Topics Concern  . Not on file  Social History Narrative  . Not on file   Additional Social History:                         Sleep: Good  Appetite:  Good  Current Medications: Current Facility-Administered Medications  Medication Dose Route Frequency Provider Last Rate Last Dose  . acetaminophen (TYLENOL) tablet 650 mg  650 mg Oral Q6H PRN Clary,  Marlane MingleGreg Lawson, MD      . alum & mag hydroxide-simeth (MAALOX/MYLANTA) 200-200-20 MG/5ML suspension 30 mL  30 mL Oral Q4H PRN Antonieta Pertlary, Greg Lawson, MD      . cloZAPine (CLOZARIL) tablet 100 mg  100 mg Oral QHS Jhovanny Guinta T, MD      . divalproex (DEPAKOTE) DR tablet 500 mg  500 mg Oral Q12H Antonieta Pertlary, Greg Lawson, MD   500 mg at 01/11/19 1513  . fluvoxaMINE (LUVOX) tablet 25 mg  25 mg Oral QHS Antonieta Pertlary, Greg Lawson, MD   25 mg at 01/08/19 2206  . hydrOXYzine (ATARAX/VISTARIL) tablet 25 mg  25 mg Oral Q6H PRN Antonieta Pertlary, Greg Lawson, MD   25 mg at 01/07/19 2151  . magnesium hydroxide (MILK OF MAGNESIA) suspension 15 mL  15 mL Oral Daily PRN Antonieta Pertlary, Greg Lawson, MD      . nicotine (NICODERM CQ - dosed in mg/24 hr) patch 7 mg  7 mg Transdermal Daily Hanako Tipping, [name], MD   7 mg at 01/10/19 1953  . paliperidone (INVEGA) 24 hr  tablet 9 mg  9 mg Oral Daily Keyonte Cookston, [name], MD   9 mg at 01/11/19 1514  . traZODone (DESYREL) tablet 100 mg  100 mg Oral QHS PRN Antonieta Pertlary, Greg Lawson, MD   100 mg at 01/07/19 2151    Lab Results: No results found for this or any previous visit (from the past 48 hour(s)).  Blood Alcohol level:  Lab Results  Component Value Date   ETH <10 01/01/2019   ETH <10 08/02/2018    Metabolic Disorder Labs: Lab Results  Component Value Date   HGBA1C 5.1 08/10/2018   MPG 99.67 08/10/2018   MPG 93.93 03/08/2018   No results found for: PROLACTIN Lab Results  Component Value Date   CHOL 175 08/10/2018   TRIG 103 08/10/2018   HDL 46 08/10/2018   CHOLHDL 3.8 08/10/2018   VLDL 21 08/10/2018   LDLCALC 108 (H) 08/10/2018   LDLCALC 168 (H) 03/08/2018    Physical Findings: AIMS:  , ,  ,  ,    CIWA:    COWS:     Musculoskeletal: Strength & Muscle Tone: within normal limits Gait & Station: normal Patient leans: N/A  Psychiatric Specialty Exam: Physical Exam  Nursing note and vitals reviewed. Constitutional: He appears well-developed and well-nourished.  HENT:  Head: Normocephalic and atraumatic.  Eyes: Pupils are equal, round, and reactive to light. Conjunctivae are normal.  Neck: Normal range of motion.  Cardiovascular: Regular rhythm and normal heart sounds.  Respiratory: Effort normal. No respiratory distress.  GI: Soft.  Musculoskeletal: Normal range of motion.  Neurological: He is alert.  Skin: Skin is warm and dry.  Psychiatric: His affect is blunt. His speech is delayed. He is slowed and withdrawn. Cognition and memory are impaired. He expresses inappropriate judgment. He expresses no suicidal ideation.    Review of Systems  Constitutional: Negative.   HENT: Negative.   Eyes: Negative.   Respiratory: Negative.   Cardiovascular: Negative.   Gastrointestinal: Negative.   Musculoskeletal: Negative.   Skin: Negative.   Neurological: Negative.   Psychiatric/Behavioral:  Negative.     Blood pressure (!) 137/91, pulse 82, temperature (!) 97.4 F (36.3 C), temperature source Oral, resp. rate 18, height 6' (1.829 m), weight 94.3 kg, SpO2 100 %.Body mass index is 28.2 kg/m.  General Appearance: Casual  Eye Contact:  Minimal  Speech:  Slow  Volume:  Decreased  Mood:  Depressed  Affect:  Constricted  Thought Process:  Disorganized  Orientation:  Negative  Thought Content:  Illogical and Rumination  Suicidal Thoughts:  No  Homicidal Thoughts:  No  Memory:  Immediate;   Fair Recent;   Poor Remote;   Poor  Judgement:  Impaired  Insight:  Lacking  Psychomotor Activity:  Decreased  Concentration:  Concentration: Poor  Recall:  Poor  Fund of Knowledge:  Poor  Language:  Poor  Akathisia:  No  Handed:  Right  AIMS (if indicated):     Assets:  Desire for Improvement Housing  ADL's:  Impaired  Cognition:  Impaired,  Mild and Moderate  Sleep:  Number of Hours: 0     Treatment Plan Summary: Daily contact with patient to assess and evaluate symptoms and progress in treatment, Medication management and Plan Patient continues with psychotic symptoms and pronounced negative symptoms and withdrawal.  No insight.  Still refuses oral medicine.  Today I told him in clear terms that he could choose between clozapine and in [place] but he had to take his medicine and without it he would not be getting any closer to discharge.  Of course he told me that he would take his medicine and promptly went up and took his [place].  We will see if that lasts.  May have to give him the long-acting injectable sooner rather than later.  Nevertheless I am also going to add his clozapine back.  Start 100 mg back at nighttime.  Previously had been taking up to 300 mg.  Alethia Berthold, MD 01/11/2019, 5:36 PM

## 2019-01-11 NOTE — Plan of Care (Signed)
  Problem: Education: Goal: Will be free of psychotic symptoms 01/11/2019 0333 by Libby Maw, RN Outcome: Not Progressing 01/10/2019 2251 by Libby Maw, RN Outcome: Not Progressing Goal: Knowledge of the prescribed therapeutic regimen will improve 01/11/2019 0333 by Libby Maw, RN Outcome: Not Progressing 01/10/2019 2251 by Libby Maw, RN Outcome: Progressing  D: Patient refused his HS dose of Luvox, despite encouragement to take it. Compliant with 8pm Depakote. Has been asking for food. Offered snacks, but patient refused and wants a sandwich from the cafeteria instead, but the cafeteria is closed. Denies SI, HI and AVH but appears internally preoccupied. Awake all night, pacing the halls. Offered sleep medication and refused.  A: Continue to monitor for safety. R: Safety maintained.

## 2019-01-11 NOTE — Progress Notes (Signed)
Patient refused his morning medication once again this morning. Patient states "I don't need it, I'm fine". This Probation officer will notify MD.

## 2019-01-12 LAB — CBC WITH DIFFERENTIAL/PLATELET
Abs Immature Granulocytes: 0.02 10*3/uL (ref 0.00–0.07)
Basophils Absolute: 0 10*3/uL (ref 0.0–0.1)
Basophils Relative: 0 %
Eosinophils Absolute: 0 10*3/uL (ref 0.0–0.5)
Eosinophils Relative: 0 %
HCT: 42.4 % (ref 39.0–52.0)
Hemoglobin: 14.6 g/dL (ref 13.0–17.0)
Immature Granulocytes: 1 %
Lymphocytes Relative: 46 %
Lymphs Abs: 1.7 10*3/uL (ref 0.7–4.0)
MCH: 28.2 pg (ref 26.0–34.0)
MCHC: 34.4 g/dL (ref 30.0–36.0)
MCV: 82 fL (ref 80.0–100.0)
Monocytes Absolute: 0.3 10*3/uL (ref 0.1–1.0)
Monocytes Relative: 9 %
Neutro Abs: 1.5 10*3/uL — ABNORMAL LOW (ref 1.7–7.7)
Neutrophils Relative %: 44 %
Platelets: 222 10*3/uL (ref 150–400)
RBC: 5.17 MIL/uL (ref 4.22–5.81)
RDW: 13 % (ref 11.5–15.5)
WBC: 3.5 10*3/uL — ABNORMAL LOW (ref 4.0–10.5)
nRBC: 0 % (ref 0.0–0.2)

## 2019-01-12 MED ORDER — PALIPERIDONE PALMITATE ER 234 MG/1.5ML IM SUSY
234.0000 mg | PREFILLED_SYRINGE | Freq: Once | INTRAMUSCULAR | Status: AC
  Administered 2019-01-12: 234 mg via INTRAMUSCULAR
  Filled 2019-01-12: qty 1.5

## 2019-01-12 NOTE — [facility] Group Notes (Signed)
  LCSW Group Therapy Note  01/12/2019 12:53 PM   Type of Therapy/Topic:  Group Therapy:  Feelings about Diagnosis  Participation Level:  Did Not Attend   Description of Group:   This group will allow patients to explore their thoughts and feelings about diagnoses they have received. Patients will be guided to explore their level of understanding and acceptance of these diagnoses. Facilitator will encourage patients to process their thoughts and feelings about the reactions of others to their diagnosis and will guide patients in identifying ways to discuss their diagnosis with significant others in their lives. This group will be process-oriented, with patients participating in exploration of their own experiences, giving and receiving support, and processing challenge from other group members.   Therapeutic Goals: 1. Patient will demonstrate understanding of diagnosis as evidenced by identifying two or more symptoms of the disorder 2. Patient will be able to express two feelings regarding the diagnosis 3. Patient will demonstrate their ability to communicate their needs through discussion and/or role play  Summary of Patient Progress: x   Therapeutic Modalities:   Cognitive Behavioral Therapy Brief Therapy Feelings Identification    Nahdia Doucet, MSW, LCSW Clinical Social Work 01/12/2019 12:53 PM    

## 2019-01-12 NOTE — Progress Notes (Signed)
Recreation Therapy Notes  Date: 01/12/2019  Time: 9:30 am   Location: Craft room   Behavioral response: N/A   Intervention Topic: Goals  Discussion/Intervention: Patient did not attend group.   Clinical Observations/Feedback:  Patient did not attend group.   Shloka Baldridge LRT/CTRS        Gretna Bergin 01/12/2019 10:44 AM

## 2019-01-12 NOTE — Plan of Care (Signed)
D- Patient alert and oriented. Patient presents in a flat, but pleasant mood on assessment stating that he slept better last night and had no major complaints to voice at this time. Patient denies any signs/symptoms of depression and anxiety to this Probation officer. Patient also denies SI, HI, AVH, and pain at this time. Patient had no stated goals for today.  A- Scheduled medications administered to patient, per MD orders. Support and encouragement provided.  Routine safety checks conducted every 15 minutes.  Patient informed to notify staff with problems or concerns.  R- No adverse drug reactions noted. Patient contracts for safety at this time. Patient compliant with medications and treatment plan. Patient receptive, calm, and cooperative. Patient interacts well with others on the unit.  Patient remains safe at this time.  Problem: Activity: Goal: Will verbalize the importance of balancing activity with adequate rest periods Outcome: Progressing   Problem: Education: Goal: Will be free of psychotic symptoms Outcome: Progressing Goal: Knowledge of the prescribed therapeutic regimen will improve Outcome: Progressing   Problem: Coping: Goal: Coping ability will improve Outcome: Progressing Goal: Will verbalize feelings Outcome: Progressing   Problem: Health Behavior/Discharge Planning: Goal: Compliance with prescribed medication regimen will improve Outcome: Progressing   Problem: Nutritional: Goal: Ability to achieve adequate nutritional intake will improve Outcome: Progressing   Problem: Role Relationship: Goal: Ability to communicate needs accurately will improve Outcome: Progressing Goal: Ability to interact with others will improve Outcome: Progressing   Problem: Safety: Goal: Ability to redirect hostility and anger into socially appropriate behaviors will improve Outcome: Progressing Goal: Ability to remain free from injury will improve Outcome: Progressing   Problem:  Self-Care: Goal: Ability to participate in self-care as condition permits will improve Outcome: Progressing   Problem: Self-Concept: Goal: Will verbalize positive feelings about self Outcome: Progressing

## 2019-01-12 NOTE — Plan of Care (Signed)
  Problem: Education: Goal: Will be free of psychotic symptoms Outcome: Progressing Goal: Knowledge of the prescribed therapeutic regimen will improve Outcome: Progressing   D: Patient was medication compliant after receiving a snack. Took Trazodone prn for sleep and has slept all night--only up x1. Denies SI, HI and AVH. Started on Clozaril tonight. Voices no complaints. Pacing at beginning of shift. A: Continue to monitor for safety. R: Safety maintained.

## 2019-01-12 NOTE — Progress Notes (Signed)
CLOZAPINE MONITORING (reflects NEW REMS GUIDELINES EFFECTIVE 05/02/2014):  Check ANC at least weekly while inpatient.  For general population patients, i.e., those without benign ethnic neutropenia (BEN): --If [place] 1000-1499, increase ANC monitoring to 3x/wk --If ANC < 1000, HOLD CLOZAPINE and get psych consult   For patients with BEN: --If [place], increase ANC monitoring to 3x/wk --If ANC < 500, HOLD CLOZAPINE and get psych consult  REMS-certified psychiatry provider can continue drug with [place] below cited thresholds if they document medical opinion that the neutropenia is not clozapine-induced (heme consult is recommended) or that risk of interrupting therapy is greater than the risk of developing severe neutropenia.  --SEE PRESCRIBING INFORMATION FOR ADDITIONAL DETAILS  6/12:  ANC = 3200 units 6/23:  ANC = 1500 units  ANC on 6/23 is slightly lower than normal but still not low enough to withhold treatment or increase monitoring frequency.   May reflect BEN as this pt is of African descent and is currently afebrile.  No indication of infection in pt's chart.   Pt is registered with clozaril REMS program and eligible for dispensing.   Current monitoring frequency is every 4 weeks but will check ANC weekly per [facility] policy.   [name] [name]

## 2019-01-12 NOTE — Progress Notes (Signed)
Patient stated that he would take his scheduled Invega injection after eating dinner.

## 2019-01-12 NOTE — Progress Notes (Signed)
[facility]  01/12/2019 2:35 PM [name] [name]  MRN:  [id number] Subjective: Follow-up for this patient with schizophrenia.  Patient seen chart reviewed.  Once again I found the patient rolled up in his blanket in his bedroom during daylight hours.  As usual he has no complaint and has very little to say.  Only wants to ask when he can be discharged.  I tried to engage him in some more conversation to draw him out about interests or plans but there is not very much there for him to say.  Patient remains frequently noncompliant with medication.  Even after I made it clear to him that he was not making any progress towards discharge when he was noncompliant.  No obvious physical side effects or illnesses.  He denies having any specific side effects or specific complaints about the medication Principal Problem: Schizoaffective disorder, bipolar type (HCC) Diagnosis: Principal Problem:   Schizoaffective disorder, bipolar type (HCC) Active Problems:   Aggressive behavior of adolescent   Noncompliance   Hypertension   Elevated CPK  Total Time spent with patient: 30 minutes  Past Psychiatric History: Patient has schizophrenia and has been getting worse over the past 2 years  Past Medical History:  Past Medical History:  Diagnosis Date  . Asthma   . Depression   . Psychosis [facility](HCC)     Past Surgical History:  Procedure Laterality Date  . BACK SURGERY     Family History: History reviewed. No pertinent family history. Family Psychiatric  History: None known Social History:  Social History   Substance and Sexual Activity  Alcohol Use No     Social History   Substance and Sexual Activity  Drug Use Yes  . Types: Marijuana    Social History   Socioeconomic History  . Marital status: Single    Spouse name: Not on file  . Number of children: Not on file  . Years of education: Not on file  . Highest education level: Not on file  Occupational History  . Not on file  Social  Needs  . Financial resource strain: Not on file  . Food insecurity    Worry: Not on file    Inability: Not on file  . Transportation needs    Medical: Not on file    Non-medical: Not on file  Tobacco Use  . Smoking status: Never Smoker  . Smokeless tobacco: Never Used  Substance and Sexual Activity  . Alcohol use: No  . Drug use: Yes    Types: Marijuana  . Sexual activity: Not Currently  Lifestyle  . Physical activity    Days per week: Not on file    Minutes per session: Not on file  . Stress: Not on file  Relationships  . Social Musicianconnections    Talks on phone: Not on file    Gets together: Not on file    Attends religious service: Not on file    Active member of club or organization: Not on file    Attends meetings of clubs or organizations: Not on file    Relationship status: Not on file  Other Topics Concern  . Not on file  Social History Narrative  . Not on file   Additional Social History:                         Sleep: Fair  Appetite:  Fair  Current Medications: Current Facility-Administered Medications  Medication Dose Route Frequency  Provider Last Rate Last Dose  . acetaminophen (TYLENOL) tablet 650 mg  650 mg Oral Q6H PRN Antonieta Pertlary, Greg Lawson, MD      . alum & mag hydroxide-simeth (MAALOX/MYLANTA) 200-200-20 MG/5ML suspension 30 mL  30 mL Oral Q4H PRN Antonieta Pertlary, Greg Lawson, MD      . cloZAPine (CLOZARIL) tablet 100 mg  100 mg Oral QHS Nyiesha Beever, [name], MD   100 mg at 01/11/19 2132  . divalproex (DEPAKOTE) DR tablet 500 mg  500 mg Oral Q12H Antonieta Pertlary, Greg Lawson, MD   500 mg at 01/12/19 0829  . fluvoxaMINE (LUVOX) tablet 25 mg  25 mg Oral QHS Antonieta Pertlary, Greg Lawson, MD   25 mg at 01/11/19 2129  . hydrOXYzine (ATARAX/VISTARIL) tablet 25 mg  25 mg Oral Q6H PRN Antonieta Pertlary, Greg Lawson, MD   25 mg at 01/07/19 2151  . magnesium hydroxide (MILK OF MAGNESIA) suspension 15 mL  15 mL Oral Daily PRN Antonieta Pertlary, Greg Lawson, MD      . nicotine (NICODERM CQ - dosed in mg/24 hr) patch  7 mg  7 mg Transdermal Daily Ailine Hefferan, [name], MD   7 mg at 01/10/19 1953  . paliperidone (INVEGA SUSTENNA) injection 234 mg  234 mg Intramuscular Once Chasen Mendell T, MD      . paliperidone (INVEGA) 24 hr tablet 9 mg  9 mg Oral Daily Maesyn Frisinger, [name], MD   9 mg at 01/12/19 0829  . traZODone (DESYREL) tablet 100 mg  100 mg Oral QHS PRN Antonieta Pertlary, Greg Lawson, MD   100 mg at 01/11/19 2128    Lab Results: No results found for this or any previous visit (from the past 48 hour(s)).  Blood Alcohol level:  Lab Results  Component Value Date   ETH <10 01/01/2019   ETH <10 08/02/2018    Metabolic Disorder Labs: Lab Results  Component Value Date   HGBA1C 5.1 08/10/2018   MPG 99.67 08/10/2018   MPG 93.93 03/08/2018   No results found for: PROLACTIN Lab Results  Component Value Date   CHOL 175 08/10/2018   TRIG 103 08/10/2018   HDL 46 08/10/2018   CHOLHDL 3.8 08/10/2018   VLDL 21 08/10/2018   LDLCALC 108 (H) 08/10/2018   LDLCALC 168 (H) 03/08/2018    Physical Findings: AIMS:  , ,  ,  ,    CIWA:    COWS:     Musculoskeletal: Strength & Muscle Tone: within normal limits Gait & Station: normal Patient leans: N/A  Psychiatric Specialty Exam: Physical Exam  Nursing note and vitals reviewed. Constitutional: He appears well-developed and well-nourished.  HENT:  Head: Normocephalic and atraumatic.  Eyes: Pupils are equal, round, and reactive to light. Conjunctivae are normal.  Neck: Normal range of motion.  Cardiovascular: Regular rhythm and normal heart sounds.  Respiratory: Effort normal.  GI: Soft.  Musculoskeletal: Normal range of motion.  Neurological: He is alert.  Skin: Skin is warm and dry.  Psychiatric: His affect is blunt. His speech is delayed. He is slowed. Cognition and memory are impaired. He expresses inappropriate judgment. He expresses no homicidal and no suicidal ideation.    Review of Systems  Constitutional: Negative.   HENT: Negative.   Eyes: Negative.    Respiratory: Negative.   Cardiovascular: Negative.   Gastrointestinal: Negative.   Musculoskeletal: Negative.   Skin: Negative.   Neurological: Negative.   Psychiatric/Behavioral: Negative for depression, hallucinations, memory loss, substance abuse and suicidal ideas. The patient is not nervous/anxious and does not have insomnia.  Blood pressure 130/84, pulse 85, temperature 97.7 F (36.5 C), temperature source Oral, resp. rate 17, height 6' (1.829 m), weight 94.3 kg, SpO2 100 %.Body mass index is 28.2 kg/m.  General Appearance: Casual  Eye Contact:  Minimal  Speech:  Slow  Volume:  Decreased  Mood:  Euthymic  Affect:  Flat  Thought Process:  Disorganized  Orientation:  Full (Time, Place, and Person)  Thought Content:  The most remarkable thing is how slow and concrete and blocked his thinking can be.  Suicidal Thoughts:  No  Homicidal Thoughts:  No  Memory:  Immediate;   Fair Recent;   Fair Remote;   Fair  Judgement:  Impaired  Insight:  Shallow  Psychomotor Activity:  Decreased  Concentration:  Concentration: Poor  Recall:  Poor  Fund of Knowledge:  Fair  Language:  Poor  Akathisia:  No  Handed:  Right  AIMS (if indicated):     Assets:  Housing Physical Health Social Support  ADL's:  Impaired  Cognition:  Impaired,  Moderate  Sleep:  Number of Hours: 7     Treatment Plan Summary: Daily contact with patient to assess and evaluate symptoms and progress in treatment, Medication management and Plan 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  Noncompliance is a major barrier.  Since his admission we have been working on trying to get him to take in [place] with the hope of switching him over to a long-acting injectable.  I had also restarted his clozapine based on his statement that he would take it but I am told that already he is trying to refuse it.  At this  point the patient is unlikely to make any progress at all which would lead him to a better level of functioning and lead to the possibility of discharge without getting consistently on antipsychotic medication.  For this reason I think it is justified to force a long-acting injection of Invega [place].  Patient already has demonstrated that he does not have a allergy to the underlying medicine and has shown some response I think.  I have consulted with another psychiatrist here on our staff and asked her to provide a second opinion.  Alethia Berthold, MD 01/12/2019, 2:35 PM

## 2019-01-12 NOTE — Progress Notes (Signed)
  2nd Opinion for Forced Psychotropic Medication  Chart review completed. The patient has a diagnosis of schizophrenia and has been exhibiting active psychotic symptoms with poor insight. He is refusing oral medications and has not had any improvement in psychosis making him a danger to himself and others. I agree with Dr. Weber Cooks assessment and believe forced psychotropic medication of LAI is warranted to prevent further decompensation.

## 2019-01-12 NOTE — Progress Notes (Signed)
D: Patient was medication compliant after receiving a snack. Took Trazodone prn for sleep and has slept all night--only up x1. Denies SI, HI and AVH. Started on Clozaril tonight. Voices no complaints. Pacing at beginning of shift. A: Continue to monitor for safety. R: Safety maintained.

## 2019-01-13 MED ORDER — CLOZAPINE 25 MG PO TABS
150.0000 mg | ORAL_TABLET | Freq: Every day | ORAL | Status: DC
  Administered 2019-01-13: 150 mg via ORAL
  Filled 2019-01-13: qty 1

## 2019-01-13 NOTE — Progress Notes (Signed)
[facility] MD Progress Note  01/13/2019 4:01 PM [name] [name]  MRN:  [id number] Subjective: Follow-up for this young man with schizophrenia.  He received a injection of long-acting in [place] yesterday.  No complications no evidence side effects.  No sign of dystonic reaction.  Unfortunately he psychiatrically remains very impaired.  Spends most of his time as usual in bed wide awake but not interacting.  Today when I spoke with him he was, as usual, polite but did not have much to say.  I encouraged him to get up out of bed and be more active.  He was response to this, as it has been in the past, was to get up and walk once to the day room and then right back into his room.  No violence no suicidal ideation but clearly unable to care for himself or live with any real quality of life outside the hospital. Principal Problem: Schizoaffective disorder, bipolar type ([place]) Diagnosis: Principal Problem:   Schizoaffective disorder, bipolar type ([place]) Active Problems:   Aggressive behavior of adolescent   Noncompliance   Hypertension   Elevated CPK  Total Time spent with patient: 30 minutes  Past Psychiatric History: Rapidly worsening schizophrenia over the last couple years complicated by persistent noncompliance  Past Medical History:  Past Medical History:  Diagnosis Date  . Asthma   . Depression   . Psychosis [facility])     Past Surgical History:  Procedure Laterality Date  . BACK SURGERY     Family History: History reviewed. No pertinent family history. Family Psychiatric  History: See previous Social History:  Social History   Substance and Sexual Activity  Alcohol Use No     Social History   Substance and Sexual Activity  Drug Use Yes  . Types: Marijuana    Social History   Socioeconomic History  . Marital status: Single    Spouse name: Not on file  . Number of children: Not on file  . Years of education: Not on file  . Highest education level: Not on file  Occupational History   . Not on file  Social Needs  . Financial resource strain: Not on file  . Food insecurity    Worry: Not on file    Inability: Not on file  . Transportation needs    Medical: Not on file    Non-medical: Not on file  Tobacco Use  . Smoking status: Never Smoker  . Smokeless tobacco: Never Used  Substance and Sexual Activity  . Alcohol use: No  . Drug use: Yes    Types: Marijuana  . Sexual activity: Not Currently  Lifestyle  . Physical activity    Days per week: Not on file    Minutes per session: Not on file  . Stress: Not on file  Relationships  . Social Herbalist on phone: Not on file    Gets together: Not on file    Attends religious service: Not on file    Active member of club or organization: Not on file    Attends meetings of clubs or organizations: Not on file    Relationship status: Not on file  Other Topics Concern  . Not on file  Social History Narrative  . Not on file   Additional Social History:                         Sleep: Fair  Appetite:  Fair  Current Medications: Current  Facility-Administered Medications  Medication Dose Route Frequency Provider Last Rate Last Dose  . acetaminophen (TYLENOL) tablet 650 mg  650 mg Oral Q6H PRN Antonieta Pertlary, Greg Lawson, MD      . alum & mag hydroxide-simeth (MAALOX/MYLANTA) 200-200-20 MG/5ML suspension 30 mL  30 mL Oral Q4H PRN Antonieta Pertlary, Greg Lawson, MD      . cloZAPine (CLOZARIL) tablet 150 mg  150 mg Oral QHS [name], [name], MD      . divalproex (DEPAKOTE) DR tablet 500 mg  500 mg Oral Q12H Antonieta Pertlary, Greg Lawson, MD   500 mg at 01/13/19 16100826  . fluvoxaMINE (LUVOX) tablet 25 mg  25 mg Oral QHS Antonieta Pertlary, Greg Lawson, MD   25 mg at 01/12/19 2100  . hydrOXYzine (ATARAX/VISTARIL) tablet 25 mg  25 mg Oral Q6H PRN Antonieta Pertlary, Greg Lawson, MD   25 mg at 01/07/19 2151  . magnesium hydroxide (MILK OF MAGNESIA) suspension 15 mL  15 mL Oral Daily PRN Antonieta Pertlary, Greg Lawson, MD      . nicotine (NICODERM CQ - dosed in mg/24 hr)  patch 7 mg  7 mg Transdermal Daily [name], [name], MD   7 mg at 01/10/19 1953  . paliperidone (INVEGA) 24 hr tablet 9 mg  9 mg Oral Daily [name], [name], MD   9 mg at 01/13/19 0826  . traZODone (DESYREL) tablet 100 mg  100 mg Oral QHS PRN Antonieta Pertlary, Greg Lawson, MD   100 mg at 01/13/19 0032    Lab Results:  Results for orders placed or performed during the hospital encounter of 01/02/19 (from the past 48 hour(s))  CBC with Differential/Platelet     Status: Abnormal   Collection Time: 01/12/19  3:04 PM  Result Value Ref Range   WBC 3.5 (L) 4.0 - 10.5 K/uL   RBC 5.17 4.22 - 5.81 MIL/uL   Hemoglobin 14.6 13.0 - 17.0 g/dL   HCT 96.042.4 45.439.0 - 09.852.0 %   MCV 82.0 80.0 - 100.0 fL   MCH 28.2 26.0 - 34.0 pg   MCHC 34.4 30.0 - 36.0 g/dL   RDW 11.913.0 14.711.5 - 82.915.5 %   Platelets 222 150 - 400 K/uL   nRBC 0.0 0.0 - 0.2 %   Neutrophils Relative % 44 %   Neutro Abs 1.5 (L) 1.7 - 7.7 K/uL   Lymphocytes Relative 46 %   Lymphs Abs 1.7 0.7 - 4.0 K/uL   Monocytes Relative 9 %   Monocytes Absolute 0.3 0.1 - 1.0 K/uL   Eosinophils Relative 0 %   Eosinophils Absolute 0.0 0.0 - 0.5 K/uL   Basophils Relative 0 %   Basophils Absolute 0.0 0.0 - 0.1 K/uL   Immature Granulocytes 1 %   Abs Immature Granulocytes 0.02 0.00 - 0.07 K/uL    Comment: Performed at [facility], [address] [place], [place] [id number]    Blood Alcohol level:  Lab Results  Component Value Date   [facility]   ETH <10 08/02/2018    Metabolic Disorder Labs: Lab Results  Component Value Date   HGBA1C 5.1 08/10/2018   MPG 99.67 08/10/2018   MPG 93.93 03/08/2018   No results found for: PROLACTIN Lab Results  Component Value Date   CHOL 175 08/10/2018   TRIG 103 08/10/2018   HDL 46 08/10/2018   CHOLHDL 3.8 08/10/2018   VLDL 21 08/10/2018   LDLCALC 108 (H) 08/10/2018   LDLCALC 168 (H) 03/08/2018    Physical Findings: AIMS:  , ,  ,  ,  CIWA:    COWS:     Musculoskeletal: Strength & Muscle Tone: within  normal limits Gait & Station: normal Patient leans: N/A  Psychiatric Specialty Exam: Physical Exam  Nursing note and vitals reviewed. Constitutional: He appears well-developed and well-nourished.  HENT:  Head: Normocephalic and atraumatic.  Eyes: Pupils are equal, round, and reactive to light. Conjunctivae are normal.  Neck: Normal range of motion.  Cardiovascular: Regular rhythm and normal heart sounds.  Respiratory: Effort normal.  GI: Soft.  Musculoskeletal: Normal range of motion.  Neurological: He is alert.  Skin: Skin is warm and dry.  Psychiatric: His affect is blunt. His speech is delayed. He is slowed. Cognition and memory are impaired. He expresses no homicidal and no suicidal ideation.    Review of Systems  Constitutional: Negative.   HENT: Negative.   Eyes: Negative.   Respiratory: Negative.   Cardiovascular: Negative.   Gastrointestinal: Negative.   Musculoskeletal: Negative.   Skin: Negative.   Neurological: Negative.   Psychiatric/Behavioral: Negative.     Blood pressure (!) 134/92, pulse (!) 104, temperature 98.2 F (36.8 C), temperature source Oral, resp. rate 18, height 6' (1.829 m), weight 94.3 kg, SpO2 99 %.Body mass index is 28.2 kg/m.  General Appearance: Casual  Eye Contact:  Minimal  Speech:  Slow  Volume:  Decreased  Mood:  Euthymic  Affect:  Flat  Thought Process:  Disorganized  Orientation:  Full (Time, Place, and Person)  Thought Content:  Rumination and Tangential  Suicidal Thoughts:  No  Homicidal Thoughts:  No  Memory:  Immediate;   Fair Recent;   Poor Remote;   Poor  Judgement:  Impaired  Insight:  Shallow  Psychomotor Activity:  Decreased  Concentration:  Concentration: Poor  Recall:  Poor  Fund of Knowledge:  Poor  Language:  Poor  Akathisia:  No  Handed:  Right  AIMS (if indicated):     Assets:  Desire for Improvement Housing Physical Health  ADL's:  Impaired  Cognition:  Impaired,  Mild  Sleep:  Number of Hours: 4.5      Treatment Plan Summary: Daily contact with patient to assess and evaluate symptoms and progress in treatment, Medication management and Plan We are pursuing a 2 medication approach right now.  Working to get him onto the long-acting in [place] while also continuing his clozapine.  Patient remains quite impaired but physically seems stable no sign of any side effects.  We will keep working on trying to encourage him to get up out of his room.  I will try to keep the outpatient team updated.  Mordecai RasmussenJohn [name], MD 01/13/2019, 4:01 PM

## 2019-01-13 NOTE — Progress Notes (Signed)
Recreation Therapy Notes  Date: 01/13/2019  Time: 9:30 am   Location: Craft room   Behavioral response: N/A   Intervention Topic: Time Management  Discussion/Intervention: Patient did not attend group.   Clinical Observations/Feedback:  Patient did not attend group.   [place] LRT/CTRS        Gianlucas Evenson 01/13/2019 11:06 AM

## 2019-01-13 NOTE — Plan of Care (Signed)
  Problem: Activity: Goal: Will verbalize the importance of balancing activity with adequate rest periods Outcome: Progressing  Patient verbalized importance of activity and rest periods

## 2019-01-13 NOTE — Progress Notes (Signed)
CSW spoke with Santiago Glad from [facility] who wanted to follow up on pt. CSW informed Santiago Glad that pt got the invega shot and apparently was doing well last night and up and out of his room a little more.  Evalina Field, MSW, LCSW Clinical Social Work 01/13/2019 11:29 AM

## 2019-01-13 NOTE — Plan of Care (Signed)
Patient is alert and oriented X 4, denies SI, HI and AVH. Patient is pleasant today, flat affect,ate breakfast, came to medication room and took medications without refusal. Patient states, " I am doing well today." Patient voiced no concerns. Patient denies pain 0/10. Safety checks to continue Q 15 minutes. Problem: Activity: Goal: Will verbalize the importance of balancing activity with adequate rest periods Outcome: Progressing   Problem: Education: Goal: Will be free of psychotic symptoms Outcome: Progressing Goal: Knowledge of the prescribed therapeutic regimen will improve Outcome: Progressing   Problem: Coping: Goal: Coping ability will improve Outcome: Progressing Goal: Will verbalize feelings Outcome: Progressing   Problem: Health Behavior/Discharge Planning: Goal: Compliance with prescribed medication regimen will improve Outcome: Progressing

## 2019-01-13 NOTE — [facility] Group Notes (Signed)
Emotional Regulation 01/13/2019 1PM  Type of Therapy/Topic:  Group Therapy:  Emotion Regulation  Participation Level:  Did Not Attend   Description of Group:   The purpose of this group is to assist patients in learning to regulate negative emotions and experience positive emotions. Patients will be guided to discuss ways in which they have been vulnerable to their negative emotions. These vulnerabilities will be juxtaposed with experiences of positive emotions or situations, and patients will be challenged to use positive emotions to combat negative ones. Special emphasis will be placed on coping with negative emotions in conflict situations, and patients will process healthy conflict resolution skills.  Therapeutic Goals: 1. Patient will identify two positive emotions or experiences to reflect on in order to balance out negative emotions 2. Patient will label two or more emotions that they find the most difficult to experience 3. Patient will demonstrate positive conflict resolution skills through discussion and/or role plays  Summary of Patient Progress:       Therapeutic Modalities:   Cognitive Behavioral Therapy Feelings Identification Dialectical Behavioral Therapy   [name] Falletta T Kate Larock, LCSW 01/13/2019 1:45 PM  

## 2019-01-13 NOTE — Tx Team (Signed)
Interdisciplinary Treatment and Diagnostic Plan Update  01/13/2019 Time of Session: 8:30 AM [name] [name] MRN: [id number]  Principal Diagnosis: Schizoaffective disorder, bipolar type ([place])  Secondary Diagnoses: Principal Problem:   Schizoaffective disorder, bipolar type ([place]) Active Problems:   Aggressive behavior of adolescent   Noncompliance   Hypertension   Elevated CPK   Current Medications:  Current Facility-Administered Medications  Medication Dose Route Frequency Provider Last Rate Last Dose  . acetaminophen (TYLENOL) tablet 650 mg  650 mg Oral Q6H PRN Sharma Covert, MD      . alum & mag hydroxide-simeth (MAALOX/MYLANTA) 200-200-20 MG/5ML suspension 30 mL  30 mL Oral Q4H PRN Sharma Covert, MD      . cloZAPine (CLOZARIL) tablet 100 mg  100 mg Oral QHS [name], Madie Reno, MD   100 mg at 01/12/19 2100  . divalproex (DEPAKOTE) DR tablet 500 mg  500 mg Oral Q12H Sharma Covert, MD   500 mg at 01/13/19 0826  . fluvoxaMINE (LUVOX) tablet 25 mg  25 mg Oral QHS Sharma Covert, MD   25 mg at 01/12/19 2100  . hydrOXYzine (ATARAX/VISTARIL) tablet 25 mg  25 mg Oral Q6H PRN Sharma Covert, MD   25 mg at 01/07/19 2151  . magnesium hydroxide (MILK OF MAGNESIA) suspension 15 mL  15 mL Oral Daily PRN Sharma Covert, MD      . nicotine (NICODERM CQ - dosed in mg/24 hr) patch 7 mg  7 mg Transdermal Daily [name], Madie Reno, MD   7 mg at 01/10/19 1953  . paliperidone (INVEGA) 24 hr tablet 9 mg  9 mg Oral Daily [name], Madie Reno, MD   9 mg at 01/13/19 0826  . traZODone (DESYREL) tablet 100 mg  100 mg Oral QHS PRN Sharma Covert, MD   100 mg at 01/13/19 0032   PTA Medications: Medications Prior to Admission  Medication Sig Dispense Refill Last Dose  . benztropine (COGENTIN) 0.5 MG tablet Take 0.5 mg by mouth 2 (two) times daily.   Past Month at Unknown time  . cloZAPine (CLOZARIL) 100 MG tablet Take 3 tablets (300 mg total) by mouth at bedtime. 90 tablet 1` Past Month at  Unknown time  . divalproex (DEPAKOTE) 500 MG DR tablet Take 1 tablet (500 mg total) by mouth every 12 (twelve) hours. 60 tablet 1 Past Month at Unknown time  . fluvoxaMINE (LUVOX) 25 MG tablet Take 1 tablet (25 mg total) by mouth at bedtime. 30 tablet 1 Past Month at Unknown time  . metFORMIN (GLUCOPHAGE) 500 MG tablet Take 1 tablet (500 mg total) by mouth daily with breakfast. 30 tablet 1 Past Month at Unknown time  . metoprolol tartrate (LOPRESSOR) 25 MG tablet Take 1 tablet (25 mg total) by mouth 2 (two) times daily. 60 tablet 1 Past Month at Unknown time  . traZODone (DESYREL) 100 MG tablet Take 100 mg by mouth at bedtime.   prn at prn    Patient Stressors: Financial difficulties Medication change or noncompliance  Patient Strengths: Motivation for treatment/growth Supportive family/friends  Treatment Modalities: Medication Management, Group therapy, Case management,  1 to 1 session with clinician, Psychoeducation, Recreational therapy.   Physician Treatment Plan for Primary Diagnosis: Schizoaffective disorder, bipolar type ([place]) Long Term Goal(s): Improvement in symptoms so as ready for discharge Improvement in symptoms so as ready for discharge   Short Term Goals: Ability to identify changes in lifestyle to reduce recurrence of condition will improve Ability to verbalize feelings will  improve Ability to demonstrate self-control will improve Ability to identify and develop effective coping behaviors will improve Ability to maintain clinical measurements within normal limits will improve Compliance with prescribed medications will improve Ability to identify changes in lifestyle to reduce recurrence of condition will improve Ability to verbalize feelings will improve Ability to demonstrate self-control will improve Ability to identify and develop effective coping behaviors will improve Ability to maintain clinical measurements within normal limits will improve Compliance with  prescribed medications will improve  Medication Management: Evaluate patient's response, side effects, and tolerance of medication regimen.  Therapeutic Interventions: 1 to 1 sessions, Unit Group sessions and Medication administration.  Evaluation of Outcomes: Not Met  Physician Treatment Plan for Secondary Diagnosis: Principal Problem:   Schizoaffective disorder, bipolar type ([place]) Active Problems:   Aggressive behavior of adolescent   Noncompliance   Hypertension   Elevated CPK  Long Term Goal(s): Improvement in symptoms so as ready for discharge Improvement in symptoms so as ready for discharge   Short Term Goals: Ability to identify changes in lifestyle to reduce recurrence of condition will improve Ability to verbalize feelings will improve Ability to demonstrate self-control will improve Ability to identify and develop effective coping behaviors will improve Ability to maintain clinical measurements within normal limits will improve Compliance with prescribed medications will improve Ability to identify changes in lifestyle to reduce recurrence of condition will improve Ability to verbalize feelings will improve Ability to demonstrate self-control will improve Ability to identify and develop effective coping behaviors will improve Ability to maintain clinical measurements within normal limits will improve Compliance with prescribed medications will improve     Medication Management: Evaluate patient's response, side effects, and tolerance of medication regimen.  Therapeutic Interventions: 1 to 1 sessions, Unit Group sessions and Medication administration.  Evaluation of Outcomes: Not Met   RN Treatment Plan for Primary Diagnosis: Schizoaffective disorder, bipolar type ([place]) Long Term Goal(s): Knowledge of disease and therapeutic regimen to maintain health will improve  Short Term Goals: Ability to participate in decision making will improve, Ability to verbalize  feelings will improve, Ability to identify and develop effective coping behaviors will improve and Compliance with prescribed medications will improve  Medication Management: RN will administer medications as ordered by provider, will assess and evaluate patient's response and provide education to patient for prescribed medication. RN will report any adverse and/or side effects to prescribing provider.  Therapeutic Interventions: 1 on 1 counseling sessions, Psychoeducation, Medication administration, Evaluate responses to treatment, Monitor vital signs and CBGs as ordered, Perform/monitor CIWA, COWS, AIMS and Fall Risk screenings as ordered, Perform wound care treatments as ordered.  Evaluation of Outcomes: Not Met   LCSW Treatment Plan for Primary Diagnosis: Schizoaffective disorder, bipolar type ([place]) Long Term Goal(s): Safe transition to appropriate next level of care at discharge, Engage patient in therapeutic group addressing interpersonal concerns.  Short Term Goals: Engage patient in aftercare planning with referrals and resources, Increase emotional regulation, Facilitate acceptance of mental health diagnosis and concerns and Increase skills for wellness and recovery  Therapeutic Interventions: Assess for all discharge needs, 1 to 1 time with Social worker, Explore available resources and support systems, Assess for adequacy in community support network, Educate family and significant other(s) on suicide prevention, Complete Psychosocial Assessment, Interpersonal group therapy.  Evaluation of Outcomes: Progressing   Progress in Treatment: Attending groups: No. Participating in groups: No. Taking medication as prescribed: No. Received forced invega shot Toleration medication: Yes. Family/Significant other contact made: No, will contact:  pt declined collateral contact Patient understands diagnosis: Yes. Discussing patient identified problems/goals with staff: Yes. Medical problems  stabilized or resolved: No. Denies suicidal/homicidal ideation: Yes. Issues/concerns per patient self-inventory: No. Other: N/A  New problem(s) identified: No, Describe:  none  New Short Term/Long Term Goal(s): medication management for mood stabilization; development of comprehensive mental wellness/sobriety plan.  Patient Goals:  "to bet better, where to go from this, and not have to come back again"  Discharge Plan or Barriers: SPE pamphlet, Mobile Crisis information, and AA/NA information provided to patient for additional community support and resources. Pt is agreeable to follow up with his current [facility] ACT[facility] team. Update 01/08/2019:  Per reports in progression, patient has begun to decline to take his medications.  Pt remains isolative and does not attend group or the milieu of the unit. 01/13/19-pt has received forced invega shot. Minimal progression at this time, pt still isolates to his room. Pt has Media planner.  Reason for Continuation of Hospitalization: Depression Medication stabilization   Estimated Length of Stay: TBD  Recreational Therapy: Patient Stressors: N/A  Patient Goal: Patient will engage in groups without prompting or encouragement from LRT x3 group sessions within 5 recreation therapy group sessions  Attendees: Patient:  01/13/2019 9:20 AM  Physician: Alethia Berthold MD 01/13/2019 9:20 AM  Nursing: Lysbeth Galas 01/13/2019 9:20 AM  RN Care Manager: 01/13/2019 9:20 AM  Social Worker: Sanjuana Kava Staples [place] 01/13/2019 9:20 AM  Recreational Therapist:  01/13/2019 9:20 AM  Other:  01/13/2019 9:20 AM  Other:  01/13/2019 9:20 AM  Other: 01/13/2019 9:20 AM    Scribe for Treatment Team: Yvette Rack, LCSW 01/13/2019 9:20 AM

## 2019-01-13 NOTE — Progress Notes (Signed)
Patient alert and oriented x 4, no confusion noted , he appears less anxious, interacting appropriately with peers and staff, and compliant with medication regimen, his thoughts are organized and coherent, he denies SI/HI/AVH. Patient attended evening wrap up group, he was appropriate, he  paced for some time this evening and he eventually went to his room. 15 minutes safety checks maintained will continue to monitor.

## 2019-01-14 MED ORDER — CLOZAPINE 100 MG PO TABS
200.0000 mg | ORAL_TABLET | Freq: Every day | ORAL | Status: DC
  Administered 2019-01-14: 200 mg via ORAL
  Filled 2019-01-14: qty 2

## 2019-01-14 NOTE — Progress Notes (Signed)
Recreation Therapy Notes   Date: 01/14/2019  Time: 9:30 am   Location: Craft room   Behavioral response: N/A   Intervention Topic: Coping skills  Discussion/Intervention: Patient did not attend group.   Clinical Observations/Feedback:  Patient did not attend group.   [name] LRT/CTRS        [name] [name] 01/14/2019 10:40 AM 

## 2019-01-14 NOTE — [facility] Group Notes (Signed)
[facility] Group Notes:  (Nursing/MHT/Case Management/Adjunct)  Date:  01/14/2019  Time:  9:09 PM  Type of Therapy:  Group Therapy  Participation Level:  Did Not Attend   [name] 01/14/2019, 9:09 PM

## 2019-01-14 NOTE — Plan of Care (Signed)
Patient was compliant with medication administration per MD orders this evening. Patient also wasn't pacing the unit like he has been the past few days.   Problem: Health Behavior/Discharge Planning: Goal: Compliance with prescribed medication regimen will improve Outcome: Not Progressing

## 2019-01-14 NOTE — [facility] Group Notes (Signed)
LCSW Group Therapy Note  01/14/2019 1:00 PM  Type of Therapy/Topic:  Group Therapy:  Balance in Life  Participation Level:  Did Not Attend  Description of Group:    This group will address the concept of balance and how it feels and looks when one is unbalanced. Patients will be encouraged to process areas in their lives that are out of balance and identify reasons for remaining unbalanced. Facilitators will guide patients in utilizing problem-solving interventions to address and correct the stressor making their life unbalanced. Understanding and applying boundaries will be explored and addressed for obtaining and maintaining a balanced life. Patients will be encouraged to explore ways to assertively make their unbalanced needs known to significant others in their lives, using other group members and facilitator for support and feedback.  Therapeutic Goals: 1. Patient will identify two or more emotions or situations they have that consume much of in their lives. 2. Patient will identify signs/triggers that life has become out of balance:  3. Patient will identify two ways to set boundaries in order to achieve balance in their lives:  4. Patient will demonstrate ability to communicate their needs through discussion and/or role plays  Summary of Patient Progress: X  Therapeutic Modalities:   Cognitive Behavioral Therapy Solution-Focused Therapy Assertiveness Training  Assunta Curtis MSW, LCSW 01/14/2019 11:57 AM

## 2019-01-14 NOTE — Plan of Care (Signed)
Pt denies depression, anxiety, SI, HI and AVH. Pt has been educated on care plan and verbalizes understanding. Collier Bullock RN Problem: Activity: Goal: Will verbalize the importance of balancing activity with adequate rest periods Outcome: Progressing   Problem: Coping: Goal: Coping ability will improve Outcome: Progressing Goal: Will verbalize feelings Outcome: Progressing   Problem: Health Behavior/Discharge Planning: Goal: Compliance with prescribed medication regimen will improve Outcome: Progressing   Problem: Role Relationship: Goal: Ability to communicate needs accurately will improve Outcome: Progressing Goal: Ability to interact with others will improve Outcome: Progressing   Problem: Safety: Goal: Ability to redirect hostility and anger into socially appropriate behaviors will improve Outcome: Progressing Goal: Ability to remain free from injury will improve Outcome: Progressing   Problem: Self-Care: Goal: Ability to participate in self-care as condition permits will improve Outcome: Progressing   Problem: Self-Concept: Goal: Will verbalize positive feelings about self Outcome: Progressing

## 2019-01-14 NOTE — Progress Notes (Signed)
[facility] MD Progress Note   01/14/2019 2:57 PM MAESON PUROHIT  MRN:  [id number] Subjective: Patient seen.  He continues to stay in his room almost constantly.  Once again in the middle of the day he was rolled up in bed in a darkened room.  Claims to have nothing much on his mind.  Encouraged him to get up and get outdoors a little bit.  As usual he was noncommittal about it.  Polite but not really doing anything.  He was given his long-acting in [place] injection and tolerated it.  Continues to at least occasionally take his clozapine.  Denies hallucinations but seems preoccupied and blunted most of the time Principal Problem: Schizoaffective disorder, bipolar type ([place]) Diagnosis: Principal Problem:   Schizoaffective disorder, bipolar type ([place]) Active Problems:   Aggressive behavior of adolescent   Noncompliance   Hypertension   Elevated CPK  Total Time spent with patient: 20 minutes  Past Psychiatric History: History of rapidly worsening schizophrenia over the last couple years  Past Medical History:  Past Medical History:  Diagnosis Date  . Asthma   . Depression   . Psychosis [facility])     Past Surgical History:  Procedure Laterality Date  . BACK SURGERY     Family History: History reviewed. No pertinent family history. Family Psychiatric  History: None reported Social History:  Social History   Substance and Sexual Activity  Alcohol Use No     Social History   Substance and Sexual Activity  Drug Use Yes  . Types: Marijuana    Social History   Socioeconomic History  . Marital status: Single    Spouse name: Not on file  . Number of children: Not on file  . Years of education: Not on file  . Highest education level: Not on file  Occupational History  . Not on file  Social Needs  . Financial resource strain: Not on file  . Food insecurity    Worry: Not on file    Inability: Not on file  . Transportation needs    Medical: Not on file    Non-medical: Not on file   Tobacco Use  . Smoking status: Never Smoker  . Smokeless tobacco: Never Used  Substance and Sexual Activity  . Alcohol use: No  . Drug use: Yes    Types: Marijuana  . Sexual activity: Not Currently  Lifestyle  . Physical activity    Days per week: Not on file    Minutes per session: Not on file  . Stress: Not on file  Relationships  . Social Herbalist on phone: Not on file    Gets together: Not on file    Attends religious service: Not on file    Active member of club or organization: Not on file    Attends meetings of clubs or organizations: Not on file    Relationship status: Not on file  Other Topics Concern  . Not on file  Social History Narrative  . Not on file   Additional Social History:                         Sleep: Fair  Appetite:  Fair  Current Medications: Current Facility-Administered Medications  Medication Dose Route Frequency Provider Last Rate Last Dose  . acetaminophen (TYLENOL) tablet 650 mg  650 mg Oral Q6H PRN Sharma Covert, MD      . alum & mag hydroxide-simeth (MAALOX/MYLANTA) [phone]  MG/5ML suspension 30 mL  30 mL Oral Q4H PRN Antonieta Pertlary, Greg Lawson, MD      . cloZAPine (CLOZARIL) tablet 200 mg  200 mg Oral QHS [name], [name], MD      . divalproex (DEPAKOTE) DR tablet 500 mg  500 mg Oral Q12H Antonieta Pertlary, Greg Lawson, MD   500 mg at 01/14/19 0806  . fluvoxaMINE (LUVOX) tablet 25 mg  25 mg Oral QHS Antonieta Pertlary, Greg Lawson, MD   25 mg at 01/13/19 2124  . hydrOXYzine (ATARAX/VISTARIL) tablet 25 mg  25 mg Oral Q6H PRN Antonieta Pertlary, Greg Lawson, MD   25 mg at 01/07/19 2151  . magnesium hydroxide (MILK OF MAGNESIA) suspension 15 mL  15 mL Oral Daily PRN Antonieta Pertlary, Greg Lawson, MD      . nicotine (NICODERM CQ - dosed in mg/24 hr) patch 7 mg  7 mg Transdermal Daily [name], [name], MD   7 mg at 01/10/19 1953  . paliperidone (INVEGA) 24 hr tablet 9 mg  9 mg Oral Daily [name], [name], MD   9 mg at 01/14/19 0805  . traZODone (DESYREL) tablet 100 mg   100 mg Oral QHS PRN Antonieta Pertlary, Greg Lawson, MD   100 mg at 01/13/19 2124    Lab Results:  Results for orders placed or performed during the hospital encounter of 01/02/19 (from the past 48 hour(s))  CBC with Differential/Platelet     Status: Abnormal   Collection Time: 01/12/19  3:04 PM  Result Value Ref Range   WBC 3.5 (L) 4.0 - 10.5 K/uL   RBC 5.17 4.22 - 5.81 MIL/uL   Hemoglobin 14.6 13.0 - 17.0 g/dL   HCT 16.142.4 09.639.0 - 04.552.0 %   MCV 82.0 80.0 - 100.0 fL   MCH 28.2 26.0 - 34.0 pg   MCHC 34.4 30.0 - 36.0 g/dL   RDW 40.913.0 81.111.5 - 91.415.5 %   Platelets 222 150 - 400 K/uL   nRBC 0.0 0.0 - 0.2 %   Neutrophils Relative % 44 %   Neutro Abs 1.5 (L) 1.7 - 7.7 K/uL   Lymphocytes Relative 46 %   Lymphs Abs 1.7 0.7 - 4.0 K/uL   Monocytes Relative 9 %   Monocytes Absolute 0.3 0.1 - 1.0 K/uL   Eosinophils Relative 0 %   Eosinophils Absolute 0.0 0.0 - 0.5 K/uL   Basophils Relative 0 %   Basophils Absolute 0.0 0.0 - 0.1 K/uL   Immature Granulocytes 1 %   Abs Immature Granulocytes 0.02 0.00 - 0.07 K/uL    Comment: Performed at [facility], [name] [place], [place] [id number]    Blood Alcohol level:  Lab Results  Component Value Date   [facility] 01/[facility]   ETH <10 08/02/2018    Metabolic Disorder Labs: Lab Results  Component Value Date   HGBA1C 5.1 08/10/2018   MPG 99.67 08/10/2018   MPG 93.93 03/08/2018   No results found for: PROLACTIN Lab Results  Component Value Date   CHOL 175 08/10/2018   TRIG 103 08/10/2018   HDL 46 08/10/2018   CHOLHDL 3.8 08/10/2018   VLDL 21 08/10/2018   LDLCALC 108 (H) 08/10/2018   LDLCALC 168 (H) 03/08/2018    Physical Findings: AIMS:  , ,  ,  ,    CIWA:    COWS:     Musculoskeletal: Strength & Muscle Tone: within normal limits Gait & Station: normal Patient leans: N/A  Psychiatric Specialty Exam: Physical Exam  Nursing note and vitals reviewed.  Constitutional: He appears well-developed and well-nourished.  HENT:  Head:  Normocephalic and atraumatic.  Eyes: Pupils are equal, round, and reactive to light. Conjunctivae are normal.  Neck: Normal range of motion.  Cardiovascular: Regular rhythm and normal heart sounds.  Respiratory: Effort normal. No respiratory distress.  GI: Soft.  Musculoskeletal: Normal range of motion.  Neurological: He is alert.  Skin: Skin is warm and dry.  Psychiatric: His affect is blunt. His speech is delayed. He is slowed and withdrawn. Cognition and memory are impaired. He expresses no suicidal ideation.    Review of Systems  Constitutional: Negative.   HENT: Negative.   Eyes: Negative.   Respiratory: Negative.   Cardiovascular: Negative.   Gastrointestinal: Negative.   Musculoskeletal: Negative.   Skin: Negative.   Neurological: Negative.   Psychiatric/Behavioral: Positive for depression. Negative for hallucinations, substance abuse and suicidal ideas. The patient has insomnia. The patient is not nervous/anxious.     Blood pressure (!) 150/98, pulse 89, temperature 97.8 F (36.6 C), temperature source Oral, resp. rate 16, height 6' (1.829 m), weight 94.3 kg, SpO2 100 %.Body mass index is 28.2 kg/m.  General Appearance: Casual  Eye Contact:  Fair  Speech:  Blocked  Volume:  Decreased  Mood:  Euthymic  Affect:  Flat  Thought Process:  Disorganized  Orientation:  Full (Time, Place, and Person)  Thought Content:  Illogical  Suicidal Thoughts:  No  Homicidal Thoughts:  No  Memory:  Immediate;   Fair Recent;   Fair Remote;   Fair  Judgement:  Fair  Insight:  Fair  Psychomotor Activity:  Decreased  Concentration:  Concentration: Poor  Recall:  Poor  Fund of Knowledge:  Fair  Language:  Fair  Akathisia:  No  Handed:  Right  AIMS (if indicated):     Assets:  Desire for Improvement Physical Health  ADL's:  Impaired  Cognition:  Impaired,  Mild  Sleep:  Number of Hours: 5     Treatment Plan Summary: Daily contact with patient to assess and evaluate symptoms  and progress in treatment, Medication management and Plan Increased dose of clozapine to 200 mg at night.  Continue with the plan to try to get him onto the long-acting shot.  If he is not showing much improvement and getting up and getting out of his bed in the next couple days we may have to try referring [facility] as he really looks completely nonfunctional at this point.  I have explained this all to him repeatedly and of course it seems to have no impact.  Still stays in bed constantly.  Does not appear to be frightened but just seems flat.  Mordecai RasmussenJohn [name], MD 01/14/2019, 2:57 PM

## 2019-01-14 NOTE — Progress Notes (Signed)
D - Patient was in his room upon arrival to the unit. Patient was pleasant during assessment and medication administration. Patient denies SI/HI/AVH, pain, anxiety and depression with this Probation officer. Patient stated, "I had a good day. I talked to the doctor about leaving and he said as long I take my medications I will be able to go home soon."   A - Patient compliant with medication administration per MD orders and procedures on the unit. Patient given education. Patient given support and encouragement to be active in his treatment plan. Patient informed to let staff know if there are any issues or problems on the unit.   R- Patient being monitored Q 15 minutes for safety per unit protocol. Patient remains safe on the unit.

## 2019-01-15 MED ORDER — CLOZAPINE 25 MG PO TABS
225.0000 mg | ORAL_TABLET | Freq: Every day | ORAL | Status: DC
  Administered 2019-01-15 – 2019-01-17 (×3): 225 mg via ORAL
  Filled 2019-01-15 (×3): qty 2

## 2019-01-15 NOTE — Progress Notes (Signed)
Recreation Therapy Notes   Date: 01/15/2019  Time: 9:30 am   Location: Craft room   Behavioral response: N/A   Intervention Topic: Self-care  Discussion/Intervention: Patient did not attend group.   Clinical Observations/Feedback:  Patient did not attend group.   Logen Heintzelman LRT/CTRS         Ziasia Lenoir 01/15/2019 10:27 AM

## 2019-01-15 NOTE — [facility] Group Notes (Signed)
LCSW Group Therapy Note  01/15/2019 2:03 PM  Type of Therapy/Topic:  Group Therapy:  Balance in Life  Participation Level:  None  Description of Group:    This group will address the concept of balance and how it feels and looks when one is unbalanced. Patients will be encouraged to process areas in their lives that are out of balance and identify reasons for remaining unbalanced. Facilitators will guide patients in utilizing problem-solving interventions to address and correct the stressor making their life unbalanced. Understanding and applying boundaries will be explored and addressed for obtaining and maintaining a balanced life. Patients will be encouraged to explore ways to assertively make their unbalanced needs known to significant others in their lives, using other group members and facilitator for support and feedback.  Therapeutic Goals: 1. Patient will identify two or more emotions or situations they have that consume much of in their lives. 2. Patient will identify signs/triggers that life has become out of balance:  3. Patient will identify two ways to set boundaries in order to achieve balance in their lives:  4. Patient will demonstrate ability to communicate their needs through discussion and/or role plays  Summary of Patient Progress: Pt was present in group, but did not participate in the group discussion.     Therapeutic Modalities:   Cognitive Behavioral Therapy Solution-Focused Therapy Assertiveness Training  Evalina Field, MSW, LCSW Clinical Social Work 01/15/2019 2:03 PM

## 2019-01-15 NOTE — BHH Counselor (Signed)
CSW faxed referral to [place] for authorization number.  CSW received confrimation that both faxes were successful.    Assunta Curtis, MSW, LCSW 01/15/2019 3:33 PM

## 2019-01-15 NOTE — Plan of Care (Signed)
Pt denies,depression, anxiety, SI,  HI and AVH. I asked if he has a small goal today and he replied "no". Pt was educated on care plan and verbalizes understanding. Petra Sargeant RN Problem: Activity: Goal: Will verbalize the importance of balancing activity with adequate rest periods Outcome: Progressing   Problem: Education: Goal: Will be free of psychotic symptoms Outcome: Progressing Goal: Knowledge of the prescribed therapeutic regimen will improve Outcome: Progressing   Problem: Coping: Goal: Coping ability will improve Outcome: Progressing Goal: Will verbalize feelings Outcome: Progressing   Problem: Health Behavior/Discharge Planning: Goal: Compliance with prescribed medication regimen will improve Outcome: Progressing   Problem: Nutritional: Goal: Ability to achieve adequate nutritional intake will improve Outcome: Progressing   Problem: Role Relationship: Goal: Ability to communicate needs accurately will improve Outcome: Progressing Goal: Ability to interact with others will improve Outcome: Progressing   Problem: Safety: Goal: Ability to redirect hostility and anger into socially appropriate behaviors will improve Outcome: Progressing Goal: Ability to remain free from injury will improve Outcome: Progressing   Problem: Self-Care: Goal: Ability to participate in self-care as condition permits will improve Outcome: Progressing   Problem: Self-Concept: Goal: Will verbalize positive feelings about self Outcome: Progressing

## 2019-01-15 NOTE — Progress Notes (Signed)
[facility] MD Progress Note  01/15/2019 4:26 PM [name] [name]  MRN:  [id number] Subjective: Follow-up for this patient with schizophrenia or schizoaffective disorder.  Once again when I went to see him he was in a dark room rolled up in a blanket wide-awake.  He did sit up in bed and converse.  He told me he thought he was doing better.  He claims that he went to a group today which is possible although I still mostly see him in bed.  Patient says the voices are not currently active.  He continues to show extremely little motivation.  He is taking his oral clozapine at night now.  He has been 4 days since he had an injection of Invega. Principal Problem: Schizoaffective disorder, bipolar type ([place]) Diagnosis: Principal Problem:   Schizoaffective disorder, bipolar type ([place]) Active Problems:   Aggressive behavior of adolescent   Noncompliance   Hypertension   Elevated CPK  Total Time spent with patient: 30 minutes  Past Psychiatric History: History of rapidly worsening schizophrenia with lots of negative symptoms.  Complicated by history of noncompliance  Past Medical History:  Past Medical History:  Diagnosis Date  . Asthma   . Depression   . Psychosis [facility])     Past Surgical History:  Procedure Laterality Date  . BACK SURGERY     Family History: History reviewed. No pertinent family history. Family Psychiatric  History: None Social History:  Social History   Substance and Sexual Activity  Alcohol Use No     Social History   Substance and Sexual Activity  Drug Use Yes  . Types: Marijuana    Social History   Socioeconomic History  . Marital status: Single    Spouse name: Not on file  . Number of children: Not on file  . Years of education: Not on file  . Highest education level: Not on file  Occupational History  . Not on file  Social Needs  . Financial resource strain: Not on file  . Food insecurity    Worry: Not on file    Inability: Not on file  . Transportation  needs    Medical: Not on file    Non-medical: Not on file  Tobacco Use  . Smoking status: Never Smoker  . Smokeless tobacco: Never Used  Substance and Sexual Activity  . Alcohol use: No  . Drug use: Yes    Types: Marijuana  . Sexual activity: Not Currently  Lifestyle  . Physical activity    Days per week: Not on file    Minutes per session: Not on file  . Stress: Not on file  Relationships  . Social Herbalist on phone: Not on file    Gets together: Not on file    Attends religious service: Not on file    Active member of club or organization: Not on file    Attends meetings of clubs or organizations: Not on file    Relationship status: Not on file  Other Topics Concern  . Not on file  Social History Narrative  . Not on file   Additional Social History:                         Sleep: Fair  Appetite:  Fair  Current Medications: Current Facility-Administered Medications  Medication Dose Route Frequency Provider Last Rate Last Dose  . acetaminophen (TYLENOL) tablet 650 mg  650 mg Oral Q6H PRN Myles Lipps  Hart RochesterLawson, MD      . alum & mag hydroxide-simeth (MAALOX/MYLANTA) 200-200-20 MG/5ML suspension 30 mL  30 mL Oral Q4H PRN Antonieta Pertlary, Greg Lawson, MD      . cloZAPine (CLOZARIL) tablet 225 mg  225 mg Oral QHS ,  T, MD      . divalproex (DEPAKOTE) DR tablet 500 mg  500 mg Oral Q12H Antonieta Pertlary, Greg Lawson, MD   500 mg at 01/15/19 0819  . fluvoxaMINE (LUVOX) tablet 25 mg  25 mg Oral QHS Antonieta Pertlary, Greg Lawson, MD   25 mg at 01/14/19 2100  . hydrOXYzine (ATARAX/VISTARIL) tablet 25 mg  25 mg Oral Q6H PRN Antonieta Pertlary, Greg Lawson, MD   25 mg at 01/07/19 2151  . magnesium hydroxide (MILK OF MAGNESIA) suspension 15 mL  15 mL Oral Daily PRN Antonieta Pertlary, Greg Lawson, MD      . nicotine (NICODERM CQ - dosed in mg/24 hr) patch 7 mg  7 mg Transdermal Daily , [name] T, MD   7 mg at 01/10/19 1953  . paliperidone (INVEGA) 24 hr tablet 9 mg  9 mg Oral Daily , [name] T, MD   9  mg at 01/15/19 0819  . traZODone (DESYREL) tablet 100 mg  100 mg Oral QHS PRN Antonieta Pertlary, Greg Lawson, MD   100 mg at 01/14/19 2154    Lab Results: No results found for this or any previous visit (from the past 48 hour(s)).  Blood Alcohol level:  Lab Results  Component Value Date   ETH <10 01/01/2019   ETH <10 08/02/2018    Metabolic Disorder Labs: Lab Results  Component Value Date   HGBA1C 5.1 08/10/2018   MPG 99.67 08/10/2018   MPG 93.93 03/08/2018   No results found for: PROLACTIN Lab Results  Component Value Date   CHOL 175 08/10/2018   TRIG 103 08/10/2018   HDL 46 08/10/2018   CHOLHDL 3.8 08/10/2018   VLDL 21 08/10/2018   LDLCALC 108 (H) 08/10/2018   LDLCALC 168 (H) 03/08/2018    Physical Findings: AIMS:  , ,  ,  ,    CIWA:    COWS:     Musculoskeletal: Strength & Muscle Tone: within normal limits Gait & Station: normal Patient leans: N/A  Psychiatric Specialty Exam: Physical Exam  Nursing note and vitals reviewed. Constitutional: He appears well-developed and well-nourished.  HENT:  Head: Normocephalic and atraumatic.  Eyes: Pupils are equal, round, and reactive to light. Conjunctivae are normal.  Neck: Normal range of motion.  Cardiovascular: Regular rhythm and normal heart sounds.  Respiratory: Effort normal.  GI: Soft.  Musculoskeletal: Normal range of motion.  Neurological: He is alert.  Skin: Skin is warm and dry.  Psychiatric: His affect is blunt. His speech is delayed. He is slowed. Cognition and memory are impaired. He expresses inappropriate judgment. He expresses no homicidal and no suicidal ideation.    Review of Systems  Constitutional: Negative.   HENT: Negative.   Eyes: Negative.   Respiratory: Negative.   Cardiovascular: Negative.   Gastrointestinal: Negative.   Musculoskeletal: Negative.   Skin: Negative.   Neurological: Negative.   Psychiatric/Behavioral: Negative.     Blood pressure 132/89, pulse (!) 103, temperature 97.8 F  (36.6 C), temperature source Oral, resp. rate 18, height 6' (1.829 m), weight 94.3 kg, SpO2 100 %.Body mass index is 28.2 kg/m.  General Appearance: Casual  Eye Contact:  Minimal  Speech:  Slow  Volume:  Decreased  Mood:  Euthymic  Affect:  Congruent  Thought Process:  Disorganized  Orientation:  Full (Time, Place, and Person)  Thought Content:  Tangential  Suicidal Thoughts:  No  Homicidal Thoughts:  No  Memory:  Immediate;   Fair Recent;   Fair Remote;   Fair  Judgement:  Impaired  Insight:  Shallow  Psychomotor Activity:  Decreased  Concentration:  Concentration: Poor  Recall:  [employer]  Fund of Knowledge:  Fair  Language:  Fair  Akathisia:  No  Handed:  Right  AIMS (if indicated):     Assets:  Housing Physical Health Resilience Social Support  ADL's:  Impaired  Cognition:  Impaired,  Mild  Sleep:  Number of Hours: 8.15     Treatment Plan Summary: Daily contact with patient to assess and evaluate symptoms and progress in treatment, Medication management and Plan Young man with schizophrenia and a long history of being very resistant to medicine.  He continues to have impairing negative symptoms.  Poor self-care.  Very limited insight.  He did get a loading dose of injectable Invega and tolerated that fine.  He is now taking his clozapine which I am going to increase as of tonight to 225 mg.  As always encourage the patient to get up out of bed be active during the day not spend all day in a darkened room and then try to get a decent night sleep.  Spoke with social work today and I am still a little on the fence about sending a referral [facility]  Not sure whether they would accept him or not.  We will for now keep working on trying to get him well here.  Mordecai RasmussenJohn , MD 01/15/2019, 4:26 PM

## 2019-01-16 NOTE — Progress Notes (Signed)
[facility]  01/16/2019 10:17 AM [name] [name] Favor  MRN:  [id number] Subjective: Patient is a 23 year old male with a past psychiatric history significant for schizoaffective disorder who was admitted on 01/01/2019 secondary to worsening psychotic symptoms, and noncompliance with his medications.  He had been laying on the floor 2 days prior to admission and unwilling to get up.  Objective: Patient is seen and examined.  Patient is a 23 year old male familiar to me from my admission of this patient to the [facility] on 01/02/2019.  He appears significantly improved from the last time I saw the patient.  He gets out of bed, is actually able to smile and engage.  He still stays isolated a significant degree, but at least his interactions today are better than they had been.  He is taking his oral clozapine at bedtime, and had the long-acting paliperidone injection approximately 4 to 5 days ago.  He denied auditory or visual hallucinations.  He denied suicidal or homicidal ideation.  His blood pressure is mildly elevated today at 141/97, he is afebrile and his pulse is 99.  Nursing notes reflect that he slept 8 hours last night.  He has had a CBC done on 6/23 which was essentially normal.  His clozapine level on 6/15 was 43.  His current medications include clozapine 225 mg p.o. nightly, Depakote DR 500 mg p.o. every 12 hours, fluvoxamine 25 mg p.o. nightly, paliperidone 9 mg p.o. daily, and hydroxyzine 25 mg p.o. every 6 hours as needed anxiety.  Principal Problem: Schizoaffective disorder, bipolar type (HCC) Diagnosis: Principal Problem:   Schizoaffective disorder, bipolar type (HCC) Active Problems:   Aggressive behavior of adolescent   Noncompliance   Hypertension   Elevated CPK  Total Time spent with patient: 15 minutes  Past Psychiatric History: See admission H&P  Past Medical History:  Past Medical History:  Diagnosis Date  . Asthma   . Depression   .  Psychosis [facility](HCC)     Past Surgical History:  Procedure Laterality Date  . BACK SURGERY     Family History: History reviewed. No pertinent family history. Family Psychiatric  History: See admission H&P Social History:  Social History   Substance and Sexual Activity  Alcohol Use No     Social History   Substance and Sexual Activity  Drug Use Yes  . Types: Marijuana    Social History   Socioeconomic History  . Marital status: Single    Spouse name: Not on file  . Number of children: Not on file  . Years of education: Not on file  . Highest education level: Not on file  Occupational History  . Not on file  Social Needs  . Financial resource strain: Not on file  . Food insecurity    Worry: Not on file    Inability: Not on file  . Transportation needs    Medical: Not on file    Non-medical: Not on file  Tobacco Use  . Smoking status: Never Smoker  . Smokeless tobacco: Never Used  Substance and Sexual Activity  . Alcohol use: No  . Drug use: Yes    Types: Marijuana  . Sexual activity: Not Currently  Lifestyle  . Physical activity    Days per week: Not on file    Minutes per session: Not on file  . Stress: Not on file  Relationships  . Social Musicianconnections    Talks on phone: Not on file    Gets together: Not  on file    Attends religious service: Not on file    Active member of club or organization: Not on file    Attends meetings of clubs or organizations: Not on file    Relationship status: Not on file  Other Topics Concern  . Not on file  Social History Narrative  . Not on file   Additional Social History:                         Sleep: Good  Appetite:  Good  Current Medications: Current Facility-Administered Medications  Medication Dose Route Frequency Provider Last Rate Last Dose  . acetaminophen (TYLENOL) tablet 650 mg  650 mg Oral Q6H PRN Antonieta Pertlary, Briton Sellman Lawson, MD      . alum & mag hydroxide-simeth (MAALOX/MYLANTA) 200-200-20 MG/5ML  suspension 30 mL  30 mL Oral Q4H PRN Antonieta Pertlary, Trysten Bernard Lawson, MD      . cloZAPine (CLOZARIL) tablet 225 mg  225 mg Oral QHS [name], [name], MD   225 mg at 01/15/19 2250  . divalproex (DEPAKOTE) DR tablet 500 mg  500 mg Oral Q12H Antonieta Pertlary, Nattalie Santiesteban Lawson, MD   500 mg at 01/16/19 0844  . fluvoxaMINE (LUVOX) tablet 25 mg  25 mg Oral QHS Antonieta Pertlary, Hooper Petteway Lawson, MD   25 mg at 01/15/19 2249  . hydrOXYzine (ATARAX/VISTARIL) tablet 25 mg  25 mg Oral Q6H PRN Antonieta Pertlary, Pattye Meda Lawson, MD   25 mg at 01/15/19 2249  . magnesium hydroxide (MILK OF MAGNESIA) suspension 15 mL  15 mL Oral Daily PRN Antonieta Pertlary, Jakel Alphin Lawson, MD      . nicotine (NICODERM CQ - dosed in mg/24 hr) patch 7 mg  7 mg Transdermal Daily [name], [name], MD   7 mg at 01/10/19 1953  . paliperidone (INVEGA) 24 hr tablet 9 mg  9 mg Oral Daily [name], [name], MD   9 mg at 01/16/19 0843  . traZODone (DESYREL) tablet 100 mg  100 mg Oral QHS PRN Antonieta Pertlary, Tory Mckissack Lawson, MD   100 mg at 01/15/19 2249    Lab Results: No results found for this or any previous visit (from the past 48 hour(s)).  Blood Alcohol level:  Lab Results  Component Value Date   ETH <10 01/01/2019   ETH <10 08/02/2018    Metabolic Disorder Labs: Lab Results  Component Value Date   HGBA1C 5.1 08/10/2018   MPG 99.67 08/10/2018   MPG 93.93 03/08/2018   No results found for: PROLACTIN Lab Results  Component Value Date   CHOL 175 08/10/2018   TRIG 103 08/10/2018   HDL 46 08/10/2018   CHOLHDL 3.8 08/10/2018   VLDL 21 08/10/2018   LDLCALC 108 (H) 08/10/2018   LDLCALC 168 (H) 03/08/2018    Physical Findings: AIMS:  , ,  ,  ,    CIWA:    COWS:     Musculoskeletal: Strength & Muscle Tone: within normal limits Gait & Station: normal Patient leans: N/A  Psychiatric Specialty Exam: Physical Exam  Nursing note and vitals reviewed. Constitutional: He is oriented to person, place, and time. He appears well-developed and well-nourished.  HENT:  Head: Normocephalic and atraumatic.   Respiratory: Effort normal.  Neurological: He is alert and oriented to person, place, and time.    ROS  Blood pressure (!) 141/97, pulse 99, temperature (!) 97.4 F (36.3 C), temperature source Oral, resp. rate 18, height 6' (1.829 m), weight 94.3 kg, SpO2 100 %.Body mass index is 28.2  kg/m.  General Appearance: Casual  Eye Contact:  Good  Speech:  Normal Rate  Volume:  Normal  Mood:  Euthymic  Affect:  Congruent  Thought Process:  Coherent and Descriptions of Associations: Circumstantial  Orientation:  Full (Time, Place, and Person)  Thought Content:  Negative  Suicidal Thoughts:  No  Homicidal Thoughts:  No  Memory:  Immediate;   Fair Recent;   Fair Remote;   Fair  Judgement:  Intact  Insight:  Lacking  Psychomotor Activity:  Normal  Concentration:  Concentration: Fair and Attention Span: Fair  Recall:  [employer] of Knowledge:  Fair  Language:  Fair  Akathisia:  Negative  Handed:  Right  AIMS (if indicated):     Assets:  Desire for Improvement Resilience  ADL's:  Intact  Cognition:  WNL  Sleep:  Number of Hours: 8     Treatment Plan Summary: Daily contact with patient to assess and evaluate symptoms and progress in treatment, Medication management and Plan : Patient is a 23 year old male with the above-stated past psychiatric history who is seen in follow-up.   Diagnosis: Schizoaffective disorder.  Patient is definitely doing better from when I saw him several weeks ago.  He was able to sit up in bed, make eye contact and engage in an appropriate manner.  He denied any suicidal or homicidal ideation.  He denied any auditory or visual hallucinations.  He has not had a Depakote level done, so I am going to order that for tomorrow in the a.m.  No changes to his current medications given his improvement. 1.  Continue clozapine 225 mg p.o. nightly for psychosis. 2.  Continue Depakote DR 500 mg p.o. every 12 hours for mood stability. 3.  Order Depakote level, liver  function enzymes and a CBC with differential for tomorrow in the a.m. 4.  Continue fluvoxamine 25 mg p.o. nightly for mood and anxiety. 5.  Continue paliperidone 9 mg p.o. daily for psychosis. 6.  Continue trazodone 100 mg p.o. nightly as needed insomnia. 7.  Disposition planning-in progress.  Sharma Covert, MD 01/16/2019, 10:17 AM

## 2019-01-16 NOTE — BHH Group Notes (Signed)
LCSW Group Therapy Note  01/16/2019  1:00 PM   Type of Therapy and Topic:  Group Therapy:  Trust and Honesty   Participation Level:  Did Not Attend   Description of Group:    In this group patients will be asked to explore the value of being honest.  Patients will be guided to discuss their thoughts, feelings, and behaviors related to honesty and trusting in others. Patients will process together how trust and honesty relate to forming relationships with peers, family members, and self. Each patient will be challenged to identify and express feelings of being vulnerable. Patients will discuss reasons why people are dishonest and identify alternative outcomes if one was truthful (to self or others). This group will be process-oriented, with patients participating in exploration of their own experiences, giving and receiving support, and processing challenge from other group members.   Therapeutic Goals: 1. Patient will identify why honesty is important to relationships and how honesty overall affects relationships.  2. Patient will identify a situation where they lied or were lied too and the  feelings, thought process, and behaviors surrounding the situation 3. Patient will identify the meaning of being vulnerable, how that feels, and how that correlates to being honest with self and others. 4. Patient will identify situations where they could have told the truth, but instead lied and explain reasons of dishonesty.   Summary of Patient Progress:  X    Therapeutic Modalities:   Cognitive Behavioral Therapy Solution Focused Therapy Motivational Interviewing Brief Therapy  Assunta Curtis, MSW, LCSW 01/16/2019 10:39 AM

## 2019-01-16 NOTE — Plan of Care (Signed)
  Problem: Education: Goal: Will be free of psychotic symptoms Outcome: Progressing  Patient appears less psychotic  

## 2019-01-16 NOTE — Plan of Care (Signed)
Pt denies depression, anxiety, SI, HI and AVH. Pt said "no" to having a goal today. Pt has been educated on care plan and verbalizes understanding. Collier Bullock RN Problem: Activity: Goal: Will verbalize the importance of balancing activity with adequate rest periods Outcome: Progressing   Problem: Education: Goal: Will be free of psychotic symptoms Outcome: Progressing Goal: Knowledge of the prescribed therapeutic regimen will improve Outcome: Progressing   Problem: Coping: Goal: Coping ability will improve Outcome: Progressing Goal: Will verbalize feelings Outcome: Progressing   Problem: Health Behavior/Discharge Planning: Goal: Compliance with prescribed medication regimen will improve Outcome: Progressing   Problem: Health Behavior/Discharge Planning: Goal: Compliance with prescribed medication regimen will improve Outcome: Progressing   Problem: Nutritional: Goal: Ability to achieve adequate nutritional intake will improve Outcome: Progressing   Problem: Role Relationship: Goal: Ability to communicate needs accurately will improve Outcome: Progressing Goal: Ability to interact with others will improve Outcome: Progressing   Problem: Safety: Goal: Ability to redirect hostility and anger into socially appropriate behaviors will improve Outcome: Progressing Goal: Ability to remain free from injury will improve Outcome: Progressing   Problem: Self-Care: Goal: Ability to participate in self-care as condition permits will improve Outcome: Progressing   Problem: Self-Concept: Goal: Will verbalize positive feelings about self Outcome: Progressing

## 2019-01-16 NOTE — B[facility] Counselor (Signed)
CSW called [facility] to confirm receipt of fax and complete phone screening.  CSW spoke with Ron.  CSW provided authorization number. CSW was informed she needed to call the following day to confirm if the patient was accepted for the waiting list.   Assunta Curtis, MSW, LCSW 01/16/2019 2:20 PM

## 2019-01-16 NOTE — Progress Notes (Signed)
Patient alert and oriented x 4, affect is blunted no distress noted, thoughts are organized and coherent , he appears less anxious , still isolates to self, he denies SI/HI/AVH complaint with medication regimen no distress noted, he was offered emotional support and encouragement to attend evening wrap up group.  Patient didn't attend evening wrap up. 15 minutes safety checks maintained.

## 2019-01-17 NOTE — Progress Notes (Signed)
CSW spoke with [facility] to see if pt was on the wait list. They reported that at this time pt is still under review.  Evalina Field, MSW, LCSW Clinical Social Work 01/17/2019 8:49 AM

## 2019-01-17 NOTE — Progress Notes (Signed)
Patient reused his Invega tablets this morning stating "they have some weird number on them, I'll just take the pink one". This Probation officer will notify MD.

## 2019-01-17 NOTE — BHH Group Notes (Signed)
LCSW Group Therapy Note  01/17/2019 11:25 AM   Type of Therapy and Topic: Group Therapy: Feelings Around Returning Home & Establishing a Supportive Framework and Supporting Oneself When Supports Not Available   Participation Level:  Did Not Attend   Description of Group:  Patients first processed thoughts and feelings about upcoming discharge. These included fears of upcoming changes, lack of change, new living environments, judgements and expectations from others and overall stigma of mental health issues. The group then discussed the definition of a supportive framework, what that looks and feels like, and how do to discern it from an unhealthy non-supportive network. The group identified different types of supports as well as what to do when your family/friends are less than helpful or unavailable   Therapeutic Goals  1. Patient will identify one healthy supportive network that they can use at discharge. 2. Patient will identify one factor of a supportive framework and how to tell it from an unhealthy network. 3. Patient able to identify one coping skill to use when they do not have positive supports from others. 4. Patient will demonstrate ability to communicate their needs through discussion and/or role plays.   Summary of Patient Progress:  x    Therapeutic Modalities Cognitive Behavioral Therapy Motivational Interviewing    Tricia Pledger, MSW, LCSW Clinical Social Work 01/17/2019 11:25 AM   

## 2019-01-17 NOTE — Progress Notes (Signed)
[facility] MD Progress Note  01/17/2019 9:25 AM [name] [name]  MRN:  [id number] Subjective:  Patient is a 23 year old male with a past psychiatric history significant for schizoaffective disorder who was admitted on 01/01/2019 secondary to worsening psychotic symptoms, and noncompliance with his medications.  He had been laying on the floor 2 days prior to admission and unwilling to get up.  Objective: Patient is seen and examined.  Patient is a 23 year old male familiar to me from my admission of this patient to the [facility] on 01/02/2019.  Patient is seen in follow-up.  He is essentially unchanged from yesterday.  The one issue that appears to have come up today is once again his noncompliance is an issue.  He stated he would take "the pink 1" this morning instead of all of his medications.  We discussed his history of noncompliance.  He only slept 4.75 hours last night.  His vital signs are stable, he is afebrile.  He denied any auditory or visual hallucinations.  He denied any suicidal or homicidal ideation.  He had a Depakote level ordered for the a.m., and that is not come back yet.  Principal Problem: Schizoaffective disorder, bipolar type ([place]) Diagnosis: Principal Problem:   Schizoaffective disorder, bipolar type ([place]) Active Problems:   Aggressive behavior of adolescent   Noncompliance   Hypertension   Elevated CPK  Total Time spent with patient: 15 minutes  Past Psychiatric History: See admission H&P  Past Medical History:  Past Medical History:  Diagnosis Date  . Asthma   . Depression   . Psychosis [facility])     Past Surgical History:  Procedure Laterality Date  . BACK SURGERY     Family History: History reviewed. No pertinent family history. Family Psychiatric  History: See admission H&P Social History:  Social History   Substance and Sexual Activity  Alcohol Use No     Social History   Substance and Sexual Activity  Drug Use Yes  . Types:  Marijuana    Social History   Socioeconomic History  . Marital status: Single    Spouse name: Not on file  . Number of children: Not on file  . Years of education: Not on file  . Highest education level: Not on file  Occupational History  . Not on file  Social Needs  . Financial resource strain: Not on file  . Food insecurity    Worry: Not on file    Inability: Not on file  . Transportation needs    Medical: Not on file    Non-medical: Not on file  Tobacco Use  . Smoking status: Never Smoker  . Smokeless tobacco: Never Used  Substance and Sexual Activity  . Alcohol use: No  . Drug use: Yes    Types: Marijuana  . Sexual activity: Not Currently  Lifestyle  . Physical activity    Days per week: Not on file    Minutes per session: Not on file  . Stress: Not on file  Relationships  . Social Herbalist on phone: Not on file    Gets together: Not on file    Attends religious service: Not on file    Active member of club or organization: Not on file    Attends meetings of clubs or organizations: Not on file    Relationship status: Not on file  Other Topics Concern  . Not on file  Social History Narrative  . Not on file  Additional Social History:                         Sleep: Fair  Appetite:  Fair  Current Medications: Current Facility-Administered Medications  Medication Dose Route Frequency Provider Last Rate Last Dose  . acetaminophen (TYLENOL) tablet 650 mg  650 mg Oral Q6H PRN Antonieta Pertlary, Dennies Coate Lawson, MD      . alum & mag hydroxide-simeth (MAALOX/MYLANTA) 200-200-20 MG/5ML suspension 30 mL  30 mL Oral Q4H PRN Antonieta Pertlary, Sayvon Arterberry Lawson, MD      . cloZAPine (CLOZARIL) tablet 225 mg  225 mg Oral QHS [name], [name], MD   225 mg at 01/16/19 2117  . divalproex (DEPAKOTE) DR tablet 500 mg  500 mg Oral Q12H Antonieta Pertlary, Charman Blasco Lawson, MD   500 mg at 01/17/19 0900  . fluvoxaMINE (LUVOX) tablet 25 mg  25 mg Oral QHS Antonieta Pertlary, Kemon Devincenzi Lawson, MD   25 mg at 01/16/19 2117  .  hydrOXYzine (ATARAX/VISTARIL) tablet 25 mg  25 mg Oral Q6H PRN Antonieta Pertlary, Marai Teehan Lawson, MD   25 mg at 01/15/19 2249  . magnesium hydroxide (MILK OF MAGNESIA) suspension 15 mL  15 mL Oral Daily PRN Antonieta Pertlary, Gwyn Hieronymus Lawson, MD      . nicotine (NICODERM CQ - dosed in mg/24 hr) patch 7 mg  7 mg Transdermal Daily [name], [name], MD   7 mg at 01/10/19 1953  . paliperidone (INVEGA) 24 hr tablet 9 mg  9 mg Oral Daily [name], [name], MD   9 mg at 01/16/19 0843  . traZODone (DESYREL) tablet 100 mg  100 mg Oral QHS PRN Antonieta Pertlary, Lacara Dunsworth Lawson, MD   100 mg at 01/15/19 2249    Lab Results: No results found for this or any previous visit (from the past 48 hour(s)).  Blood Alcohol level:  Lab Results  Component Value Date   ETH <10 01/01/2019   ETH <10 08/02/2018    Metabolic Disorder Labs: Lab Results  Component Value Date   HGBA1C 5.1 08/10/2018   MPG 99.67 08/10/2018   MPG 93.93 03/08/2018   No results found for: PROLACTIN Lab Results  Component Value Date   CHOL 175 08/10/2018   TRIG 103 08/10/2018   HDL 46 08/10/2018   CHOLHDL 3.8 08/10/2018   VLDL 21 08/10/2018   LDLCALC 108 (H) 08/10/2018   LDLCALC 168 (H) 03/08/2018    Physical Findings: AIMS:  , ,  ,  ,    CIWA:    COWS:     Musculoskeletal: Strength & Muscle Tone: within normal limits Gait & Station: normal Patient leans: N/A  Psychiatric Specialty Exam: Physical Exam  Nursing note and vitals reviewed. Constitutional: He is oriented to person, place, and time. He appears well-developed and well-nourished.  HENT:  Head: Normocephalic and atraumatic.  Respiratory: Effort normal.  Neurological: He is alert and oriented to person, place, and time.    ROS  Blood pressure 106/73, pulse 95, temperature 98.6 F (37 C), temperature source Oral, resp. rate 18, height 6' (1.829 m), weight 94.3 kg, SpO2 100 %.Body mass index is 28.2 kg/m.  General Appearance: Casual  Eye Contact:  Good  Speech:  Normal Rate  Volume:  Decreased  Mood:   Anxious  Affect:  Congruent  Thought Process:  Goal Directed and Descriptions of Associations: Circumstantial  Orientation:  Full (Time, Place, and Person)  Thought Content:  Logical  Suicidal Thoughts:  No  Homicidal Thoughts:  No  Memory:  Immediate;  Fair Recent;   Fair Remote;   Fair  Judgement:  Intact  Insight:  Lacking  Psychomotor Activity:  Normal  Concentration:  Concentration: Fair and Attention Span: Fair  Recall:  [employer]  Fund of Knowledge:  Fair  Language:  Fair  Akathisia:  Negative  Handed:  Right  AIMS (if indicated):     Assets:  Desire for Improvement Resilience  ADL's:  Intact  Cognition:  WNL  Sleep:  Number of Hours: 4.75     Treatment Plan Summary: Daily contact with patient to assess and evaluate symptoms and progress in treatment, Medication management and Plan : Patient is a 23 year old male with the above-stated past psychiatric history who is seen in follow-up   Diagnosis: Schizoaffective disorder.  Patient is seen in follow-up this morning.  He is essentially unchanged from yesterday.  He did refuse some medication this a.m.  We have discussed the issues with his compliance.  We discussed his noncompliance prior to his readmission to the hospital.  His insight is somewhat limited towards the importance of his medications.  Otherwise no change in his current treatment.  I am waiting for the results of his Depakote level, CBC with differential as well as liver function enzymes.  Otherwise no changes in his treatment at this point. 1.  Continue clozapine 225 mg p.o. nightly for psychosis. 2.  Continue Depakote DR 500 mg p.o. every 12 hours for mood stability. 3.  Awaiting Depakote level, liver function enzymes and a CBC with differential from labs this a.m. 4.  Continue fluvoxamine 25 mg p.o. nightly for mood and anxiety. 5.  Continue paliperidone 9 mg p.o. daily for psychosis. 6.  Continue trazodone 100 mg p.o. nightly as needed insomnia. 7.   Disposition planning-in progress.  Antonieta PertGreg Lawson Jamayia Croker, MD 01/17/2019, 9:25 AM

## 2019-01-17 NOTE — Progress Notes (Signed)
D: Patient was medication compliant. More isolative. Slept most of the night. Denies SI, HI and AVH. Appears internally preoccupied. Guarded.  A: Continue to monitor for safety.  R: Safety maintained.

## 2019-01-17 NOTE — Plan of Care (Signed)
  Problem: Activity: Goal: Will verbalize the importance of balancing activity with adequate rest periods Outcome: Progressing  D: Patient was medication compliant. More isolative. Slept most of the night. Denies SI, HI and AVH. Appears internally preoccupied. Guarded.  A: Continue to monitor for safety.  R: Safety maintained.

## 2019-01-17 NOTE — Plan of Care (Signed)
D- Patient alert and oriented. Patient presents in a pleasant mood on assessment stating that he slept "well" last night and had no major complaints or concerns to voice to this Probation officer. Patient denies any depression/anxiety to this Probation officer. Patient also denies SI, HI, AVH, and pain at this time. Patient had no stated goals at this time.   A- Scheduled medications administered to patient, per MD orders. Support and encouragement provided.  Routine safety checks conducted every 15 minutes.  Patient informed to notify staff with problems or concerns.  R- No adverse drug reactions noted. Patient contracts for safety at this time. Patient compliant with medications and treatment plan. Patient receptive, calm, and cooperative. Patient interacts well with others on the unit.  Patient remains safe at this time.  Problem: Activity: Goal: Will verbalize the importance of balancing activity with adequate rest periods Outcome: Progressing   Problem: Education: Goal: Will be free of psychotic symptoms Outcome: Progressing Goal: Knowledge of the prescribed therapeutic regimen will improve Outcome: Progressing   Problem: Coping: Goal: Coping ability will improve Outcome: Progressing Goal: Will verbalize feelings Outcome: Progressing   Problem: Health Behavior/Discharge Planning: Goal: Compliance with prescribed medication regimen will improve Outcome: Progressing   Problem: Nutritional: Goal: Ability to achieve adequate nutritional intake will improve Outcome: Progressing   Problem: Role Relationship: Goal: Ability to communicate needs accurately will improve Outcome: Progressing Goal: Ability to interact with others will improve Outcome: Progressing   Problem: Safety: Goal: Ability to redirect hostility and anger into socially appropriate behaviors will improve Outcome: Progressing Goal: Ability to remain free from injury will improve Outcome: Progressing   Problem: Self-Care: Goal:  Ability to participate in self-care as condition permits will improve Outcome: Progressing   Problem: Self-Concept: Goal: Will verbalize positive feelings about self Outcome: Progressing

## 2019-01-18 MED ORDER — PALIPERIDONE PALMITATE ER 156 MG/ML IM SUSY
156.0000 mg | PREFILLED_SYRINGE | Freq: Once | INTRAMUSCULAR | Status: AC
  Administered 2019-01-18: 156 mg via INTRAMUSCULAR
  Filled 2019-01-18 (×2): qty 1

## 2019-01-18 MED ORDER — CLOZAPINE 25 MG PO TABS
250.0000 mg | ORAL_TABLET | Freq: Every day | ORAL | Status: DC
  Administered 2019-01-18 – 2019-01-21 (×4): 250 mg via ORAL
  Filled 2019-01-18 (×4): qty 2

## 2019-01-18 NOTE — BHH Counselor (Signed)
CSW spoke with Baxter Flattery and [place] confirmed patient was on the waitlist.  CSW infomed that patient was still in need of a bed.  CSW asked where patient was on the list and CSW was informed "we have a pretty long list and he is not at the top".  Baxter Flattery would not provide any further information.  Assunta Curtis, MSW, LCSW 01/18/2019 9:03 AM

## 2019-01-18 NOTE — [facility] Group Notes (Signed)
LCSW Group Therapy Note   01/18/2019 1:00 PM  Type of Therapy and Topic:  Group Therapy:  Overcoming Obstacles   Participation Level:  Did Not Attend   Description of Group:    In this group patients will be encouraged to explore what they see as obstacles to their own wellness and recovery. They will be guided to discuss their thoughts, feelings, and behaviors related to these obstacles. The group will process together ways to cope with barriers, with attention given to specific choices patients can make. Each patient will be challenged to identify changes they are motivated to make in order to overcome their obstacles. This group will be process-oriented, with patients participating in exploration of their own experiences as well as giving and receiving support and challenge from other group members.   Therapeutic Goals: 1. Patient will identify personal and current obstacles as they relate to admission. 2. Patient will identify barriers that currently interfere with their wellness or overcoming obstacles.  3. Patient will identify feelings, thought process and behaviors related to these barriers. 4. Patient will identify two changes they are willing to make to overcome these obstacles:      Summary of Patient Progress X   Therapeutic Modalities:   Cognitive Behavioral Therapy Solution Focused Therapy Motivational Interviewing Relapse Prevention Therapy  Sanita Estrada, MSW, LCSW 01/18/2019 11:56 AM    

## 2019-01-18 NOTE — Progress Notes (Signed)
[facility]  01/18/2019 6:05 PM [name] [name]  MRN:  [id number] Subjective: Follow-up patient with schizophrenia.  Patient remains in bed almost all the time.  Does not interact with anyone.  When approached he will easily arouse and answer a few questions but then stays in bed.  Denies psychotic symptoms on direct questioning but has severe thought blocking and lack of motivation.  No sign of any side effects from medicine. Principal Problem: Schizoaffective disorder, bipolar type (HCC) Diagnosis: Principal Problem:   Schizoaffective disorder, bipolar type (HCC) Active Problems:   Aggressive behavior of adolescent   Noncompliance   Hypertension   Elevated CPK  Total Time spent with patient: 30 minutes  Past Psychiatric History: Long history of schizophrenia multiple hospitalizations  Past Medical History:  Past Medical History:  Diagnosis Date  . Asthma   . Depression   . Psychosis [facility](HCC)     Past Surgical History:  Procedure Laterality Date  . BACK SURGERY     Family History: History reviewed. No pertinent family history. Family Psychiatric  History: See previous Social History:  Social History   Substance and Sexual Activity  Alcohol Use No     Social History   Substance and Sexual Activity  Drug Use Yes  . Types: Marijuana    Social History   Socioeconomic History  . Marital status: Single    Spouse name: Not on file  . Number of children: Not on file  . Years of education: Not on file  . Highest education level: Not on file  Occupational History  . Not on file  Social Needs  . Financial resource strain: Not on file  . Food insecurity    Worry: Not on file    Inability: Not on file  . Transportation needs    Medical: Not on file    Non-medical: Not on file  Tobacco Use  . Smoking status: Never Smoker  . Smokeless tobacco: Never Used  Substance and Sexual Activity  . Alcohol use: No  . Drug use: Yes    Types: Marijuana  . Sexual  activity: Not Currently  Lifestyle  . Physical activity    Days per week: Not on file    Minutes per session: Not on file  . Stress: Not on file  Relationships  . Social Musicianconnections    Talks on phone: Not on file    Gets together: Not on file    Attends religious service: Not on file    Active member of club or organization: Not on file    Attends meetings of clubs or organizations: Not on file    Relationship status: Not on file  Other Topics Concern  . Not on file  Social History Narrative  . Not on file   Additional Social History:                         Sleep: Fair  Appetite:  Fair  Current Medications: Current Facility-Administered Medications  Medication Dose Route Frequency Provider Last Rate Last Dose  . acetaminophen (TYLENOL) tablet 650 mg  650 mg Oral Q6H PRN Antonieta Pertlary, Greg Lawson, MD      . alum & mag hydroxide-simeth (MAALOX/MYLANTA) 200-200-20 MG/5ML suspension 30 mL  30 mL Oral Q4H PRN Antonieta Pertlary, Greg Lawson, MD      . cloZAPine (CLOZARIL) tablet 250 mg  250 mg Oral QHS ,  T, MD      . divalproex (DEPAKOTE) DR tablet  500 mg  500 mg Oral Q12H Sharma Covert, MD   500 mg at 01/18/19 0751  . fluvoxaMINE (LUVOX) tablet 25 mg  25 mg Oral QHS Sharma Covert, MD   25 mg at 01/17/19 2123  . hydrOXYzine (ATARAX/VISTARIL) tablet 25 mg  25 mg Oral Q6H PRN Sharma Covert, MD   25 mg at 01/15/19 2249  . magnesium hydroxide (MILK OF MAGNESIA) suspension 15 mL  15 mL Oral Daily PRN Sharma Covert, MD      . nicotine (NICODERM CQ - dosed in mg/24 hr) patch 7 mg  7 mg Transdermal Daily , Madie Reno, MD   7 mg at 01/10/19 1953  . paliperidone (INVEGA SUSTENNA) injection 156 mg  156 mg Intramuscular Once ,  T, MD      . traZODone (DESYREL) tablet 100 mg  100 mg Oral QHS PRN Sharma Covert, MD   100 mg at 01/15/19 2249    Lab Results: No results found for this or any previous visit (from the past 88 hour(s)).  Blood Alcohol level:   Lab Results  Component Value Date   ETH <10 01/01/2019   ETH <10 40/02/6760    Metabolic Disorder Labs: Lab Results  Component Value Date   HGBA1C 5.1 08/10/2018   MPG 99.67 08/10/2018   MPG 93.93 03/08/2018   No results found for: PROLACTIN Lab Results  Component Value Date   CHOL 175 08/10/2018   TRIG 103 08/10/2018   HDL 46 08/10/2018   CHOLHDL 3.8 08/10/2018   VLDL 21 08/10/2018   LDLCALC 108 (H) 08/10/2018   LDLCALC 168 (H) 03/08/2018    Physical Findings: AIMS:  , ,  ,  ,    CIWA:    COWS:     Musculoskeletal: Strength & Muscle Tone: within normal limits Gait & Station: normal Patient leans: N/A  Psychiatric Specialty Exam: Physical Exam  Nursing note and vitals reviewed. Constitutional: He appears well-developed and well-nourished.  HENT:  Head: Normocephalic and atraumatic.  Eyes: Pupils are equal, round, and reactive to light. Conjunctivae are normal.  Neck: Normal range of motion.  Cardiovascular: Regular rhythm and normal heart sounds.  Respiratory: Effort normal.  GI: Soft.  Musculoskeletal: Normal range of motion.  Neurological: He is alert.  Skin: Skin is warm and dry.  Psychiatric: His affect is blunt. His speech is delayed. He is slowed and withdrawn. Cognition and memory are impaired. He expresses inappropriate judgment. He expresses no suicidal ideation.    Review of Systems  Constitutional: Negative.   HENT: Negative.   Eyes: Negative.   Respiratory: Negative.   Cardiovascular: Negative.   Gastrointestinal: Negative.   Musculoskeletal: Negative.   Skin: Negative.   Neurological: Negative.   Psychiatric/Behavioral: Negative for depression, hallucinations, memory loss, substance abuse and suicidal ideas. The patient is not nervous/anxious and does not have insomnia.     Blood pressure (!) 135/95, pulse (!) 115, temperature 98 F (36.7 C), temperature source Oral, resp. rate 18, height 6' (1.829 m), weight 94.3 kg, SpO2 100 %.Body mass  index is 28.2 kg/m.  General Appearance: Casual  Eye Contact:  Minimal  Speech:  Slow  Volume:  Decreased  Mood:  Euthymic  Affect:  Blunt  Thought Process:  Coherent  Orientation:  Full (Time, Place, and Person)  Thought Content:  Illogical and Tangential  Suicidal Thoughts:  No  Homicidal Thoughts:  No  Memory:  Immediate;   Fair Recent;   Fair Remote;   Poor  Judgement:  Impaired  Insight:  Lacking  Psychomotor Activity:  Decreased  Concentration:  Concentration: Poor  Recall:  Poor  Fund of Knowledge:  Fair  Language:  Fair  Akathisia:  No  Handed:  Right  AIMS (if indicated):     Assets:  Housing Physical Health Resilience Social Support  ADL's:  Impaired  Cognition:  Impaired,  Moderate  Sleep:  Number of Hours: 8     Treatment Plan Summary: Daily contact with patient to assess and evaluate symptoms and progress in treatment, Medication management and Plan No progress.  Generally he is compliant with the clozapine.  He is only intermittently compliant with other medicine in an irregular fashion governed by his psychosis.  It is time for him to get his second loading shot of long-acting in [place].  Shot will be ordered.  Discontinue oral Invega but increase clozapine to 250.  Continue to recommend consideration of long-term hospitalization.  Mordecai RasmussenJohn , MD 01/18/2019, 6:05 PM

## 2019-01-18 NOTE — Tx Team (Signed)
Interdisciplinary Treatment and Diagnostic Plan Update  01/18/2019 Time of Session: 8:30 AM [name] [name] MRN: [id number]  Principal Diagnosis: Schizoaffective disorder, bipolar type ([place])  Secondary Diagnoses: Principal Problem:   Schizoaffective disorder, bipolar type ([name]) Active Problems:   Aggressive behavior of adolescent   Noncompliance   Hypertension   Elevated CPK   Current Medications:  Current Facility-Administered Medications  Medication Dose Route Frequency Provider Last Rate Last Dose  . acetaminophen (TYLENOL) tablet 650 mg  650 mg Oral Q6H PRN Sharma Covert, MD      . alum & mag hydroxide-simeth (MAALOX/MYLANTA) 200-200-20 MG/5ML suspension 30 mL  30 mL Oral Q4H PRN Sharma Covert, MD      . cloZAPine (CLOZARIL) tablet 225 mg  225 mg Oral QHS [name], Madie Reno, MD   225 mg at 01/17/19 2123  . divalproex (DEPAKOTE) DR tablet 500 mg  500 mg Oral Q12H Sharma Covert, MD   500 mg at 01/18/19 0751  . fluvoxaMINE (LUVOX) tablet 25 mg  25 mg Oral QHS Sharma Covert, MD   25 mg at 01/17/19 2123  . hydrOXYzine (ATARAX/VISTARIL) tablet 25 mg  25 mg Oral Q6H PRN Sharma Covert, MD   25 mg at 01/15/19 2249  . magnesium hydroxide (MILK OF MAGNESIA) suspension 15 mL  15 mL Oral Daily PRN Sharma Covert, MD      . nicotine (NICODERM CQ - dosed in mg/24 hr) patch 7 mg  7 mg Transdermal Daily [name], Madie Reno, MD   7 mg at 01/10/19 1953  . paliperidone (INVEGA) 24 hr tablet 9 mg  9 mg Oral Daily [name], Madie Reno, MD   9 mg at 01/16/19 0843  . traZODone (DESYREL) tablet 100 mg  100 mg Oral QHS PRN Sharma Covert, MD   100 mg at 01/15/19 2249   PTA Medications: Medications Prior to Admission  Medication Sig Dispense Refill Last Dose  . benztropine (COGENTIN) 0.5 MG tablet Take 0.5 mg by mouth 2 (two) times daily.   Past Month at Unknown time  . cloZAPine (CLOZARIL) 100 MG tablet Take 3 tablets (300 mg total) by mouth at bedtime. 90 tablet 1` Past Month at  Unknown time  . divalproex (DEPAKOTE) 500 MG DR tablet Take 1 tablet (500 mg total) by mouth every 12 (twelve) hours. 60 tablet 1 Past Month at Unknown time  . fluvoxaMINE (LUVOX) 25 MG tablet Take 1 tablet (25 mg total) by mouth at bedtime. 30 tablet 1 Past Month at Unknown time  . metFORMIN (GLUCOPHAGE) 500 MG tablet Take 1 tablet (500 mg total) by mouth daily with breakfast. 30 tablet 1 Past Month at Unknown time  . metoprolol tartrate (LOPRESSOR) 25 MG tablet Take 1 tablet (25 mg total) by mouth 2 (two) times daily. 60 tablet 1 Past Month at Unknown time  . traZODone (DESYREL) 100 MG tablet Take 100 mg by mouth at bedtime.   prn at prn    Patient Stressors: Financial difficulties Medication change or noncompliance  Patient Strengths: Motivation for treatment/growth Supportive family/friends  Treatment Modalities: Medication Management, Group therapy, Case management,  1 to 1 session with clinician, Psychoeducation, Recreational therapy.   Physician Treatment Plan for Primary Diagnosis: Schizoaffective disorder, bipolar type ([name]) Long Term Goal(s): Improvement in symptoms so as ready for discharge Improvement in symptoms so as ready for discharge   Short Term Goals: Ability to identify changes in lifestyle to reduce recurrence of condition will improve Ability to verbalize feelings will  improve Ability to demonstrate self-control will improve Ability to identify and develop effective coping behaviors will improve Ability to maintain clinical measurements within normal limits will improve Compliance with prescribed medications will improve Ability to identify changes in lifestyle to reduce recurrence of condition will improve Ability to verbalize feelings will improve Ability to demonstrate self-control will improve Ability to identify and develop effective coping behaviors will improve Ability to maintain clinical measurements within normal limits will improve Compliance with  prescribed medications will improve  Medication Management: Evaluate patient's response, side effects, and tolerance of medication regimen.  Therapeutic Interventions: 1 to 1 sessions, Unit Group sessions and Medication administration.  Evaluation of Outcomes: Not Met  Physician Treatment Plan for Secondary Diagnosis: Principal Problem:   Schizoaffective disorder, bipolar type ([name]) Active Problems:   Aggressive behavior of adolescent   Noncompliance   Hypertension   Elevated CPK  Long Term Goal(s): Improvement in symptoms so as ready for discharge Improvement in symptoms so as ready for discharge   Short Term Goals: Ability to identify changes in lifestyle to reduce recurrence of condition will improve Ability to verbalize feelings will improve Ability to demonstrate self-control will improve Ability to identify and develop effective coping behaviors will improve Ability to maintain clinical measurements within normal limits will improve Compliance with prescribed medications will improve Ability to identify changes in lifestyle to reduce recurrence of condition will improve Ability to verbalize feelings will improve Ability to demonstrate self-control will improve Ability to identify and develop effective coping behaviors will improve Ability to maintain clinical measurements within normal limits will improve Compliance with prescribed medications will improve     Medication Management: Evaluate patient's response, side effects, and tolerance of medication regimen.  Therapeutic Interventions: 1 to 1 sessions, Unit Group sessions and Medication administration.  Evaluation of Outcomes: Not Met   RN Treatment Plan for Primary Diagnosis: Schizoaffective disorder, bipolar type ([name]) Long Term Goal(s): Knowledge of disease and therapeutic regimen to maintain health will improve  Short Term Goals: Ability to participate in decision making will improve, Ability to verbalize  feelings will improve, Ability to identify and develop effective coping behaviors will improve and Compliance with prescribed medications will improve  Medication Management: RN will administer medications as ordered by provider, will assess and evaluate patient's response and provide education to patient for prescribed medication. RN will report any adverse and/or side effects to prescribing provider.  Therapeutic Interventions: 1 on 1 counseling sessions, Psychoeducation, Medication administration, Evaluate responses to treatment, Monitor vital signs and CBGs as ordered, Perform/monitor CIWA, COWS, AIMS and Fall Risk screenings as ordered, Perform wound care treatments as ordered.  Evaluation of Outcomes: Not Met   LCSW Treatment Plan for Primary Diagnosis: Schizoaffective disorder, bipolar type ([place]) Long Term Goal(s): Safe transition to appropriate next level of care at discharge, Engage patient in therapeutic group addressing interpersonal concerns.  Short Term Goals: Engage patient in aftercare planning with referrals and resources, Increase emotional regulation, Facilitate acceptance of mental health diagnosis and concerns and Increase skills for wellness and recovery  Therapeutic Interventions: Assess for all discharge needs, 1 to 1 time with Social worker, Explore available resources and support systems, Assess for adequacy in community support network, Educate family and significant other(s) on suicide prevention, Complete Psychosocial Assessment, Interpersonal group therapy.  Evaluation of Outcomes: Not Met   Progress in Treatment: Attending groups: No. Participating in groups: No. Taking medication as prescribed: No. Received forced invega shot Toleration medication: Yes. Family/Significant other contact made: No, will contact:  pt declined collateral contact Patient understands diagnosis: Yes. Discussing patient identified problems/goals with staff: Yes. Medical problems  stabilized or resolved: No. Denies suicidal/homicidal ideation: Yes. Issues/concerns per patient self-inventory: No. Other: N/A  New problem(s) identified: No, Describe:  none  New Short Term/Long Term Goal(s): medication management for mood stabilization; development of comprehensive mental wellness/sobriety plan.  Patient Goals:  "to bet better, where to go from this, and not have to come back again"  Discharge Plan or Barriers: SPE pamphlet, Mobile Crisis information, and AA/NA information provided to patient for additional community support and resources. Pt is agreeable to follow up with his current [facility] ACT[facility] team. Update 01/08/2019:  Per reports in progression, patient has begun to decline to take his medications.  Pt remains isolative and does not attend group or the milieu of the unit. 01/13/19-pt has received forced invega shot. Minimal progression at this time, pt still isolates to his room. Pt has Media planner. UPDATE 01/18/19 - Pt has been referred to [facility], pt is currently on the wait list.  Reason for Continuation of Hospitalization: Depression Medication stabilization   Estimated Length of Stay: TBD  Recreational Therapy: Patient Stressors: N/A  Patient Goal: Patient will engage in groups without prompting or encouragement from LRT x3 group sessions within 5 recreation therapy group sessions  Attendees: Patient: [name] [name] 01/18/2019 9:21 AM  Physician:  01/18/2019 9:21 AM  Nursing:  01/18/2019 9:21 AM  RN Care Manager: 01/18/2019 9:21 AM  Social Worker: Minette Brine Audwin Semper LCSW 01/18/2019 9:21 AM  Recreational Therapist:  01/18/2019 9:21 AM  Other:  01/18/2019 9:21 AM  Other:  01/18/2019 9:21 AM  Other: 01/18/2019 9:21 AM    Scribe for Treatment Team: Mariann Laster Gianni Mihalik, LCSW 01/18/2019 9:21 AM

## 2019-01-18 NOTE — Plan of Care (Signed)
  Problem: Education: Goal: Will be free of psychotic symptoms Outcome: Progressing Goal: Knowledge of the prescribed therapeutic regimen will improve Outcome: Progressing  D: Patient remains isolative to room. Did take all of his medication. Forwards very little in conversation. Denies SI, HI and AVH. Thought blocking. Affect flat. Mood is sad. Appears guarded and internally preoccupied. A: Continue to monitor for safety. R: Safety maintained.

## 2019-01-18 NOTE — Progress Notes (Signed)
Patient continues to refuse his [name] [name] stating that "I'll just talk to the doctor tomorrow".

## 2019-01-18 NOTE — BHH Counselor (Signed)
CSW spoke with Santiago Glad at [facility]. CSW informed that patient was on the waitlist for [facility].  Assunta Curtis, MSW, LCSW 01/18/2019 1:57 PM

## 2019-01-18 NOTE — Plan of Care (Signed)
D- Patient alert and oriented. Patient presents in a pleasant mood on assessment reporting that he slept "well" last night and had no major complaints to voice to this Probation officer. Patient denies SI, HI, AVH, and pain at this time. Patient also denies any signs/symptoms of depression and anxiety. Patient had no stated goals for today.  A- Scheduled medications administered to patient, per MD orders. Support and encouragement provided.  Routine safety checks conducted every 15 minutes.  Patient informed to notify staff with problems or concerns.  R- No adverse drug reactions noted. Patient contracts for safety at this time. Patient half-way compliant with medications and treatment plan. Patient refused one of his scheduled  Patient receptive, calm, and cooperative. Patient interacts well with others on the unit.  Patient remains safe at this time.  Problem: Activity: Goal: Will verbalize the importance of balancing activity with adequate rest periods Outcome: Not Progressing   Problem: Education: Goal: Will be free of psychotic symptoms Outcome: Not Progressing Goal: Knowledge of the prescribed therapeutic regimen will improve Outcome: Not Progressing   Problem: Coping: Goal: Coping ability will improve Outcome: Not Progressing Goal: Will verbalize feelings Outcome: Not Progressing   Problem: Health Behavior/Discharge Planning: Goal: Compliance with prescribed medication regimen will improve Outcome: Not Progressing   Problem: Nutritional: Goal: Ability to achieve adequate nutritional intake will improve Outcome: Not Progressing   Problem: Role Relationship: Goal: Ability to communicate needs accurately will improve Outcome: Not Progressing Goal: Ability to interact with others will improve Outcome: Not Progressing   Problem: Safety: Goal: Ability to redirect hostility and anger into socially appropriate behaviors will improve Outcome: Not Progressing Goal: Ability to remain free  from injury will improve Outcome: Not Progressing   Problem: Self-Care: Goal: Ability to participate in self-care as condition permits will improve Outcome: Not Progressing   Problem: Self-Concept: Goal: Will verbalize positive feelings about self Outcome: Not Progressing

## 2019-01-18 NOTE — Progress Notes (Signed)
Recreation Therapy Notes   Date: 01/18/2019  Time: 9:30 am   Location: Craft room   Behavioral response: N/A   Intervention Topic: Stress  Discussion/Intervention: Patient did not attend group.   Clinical Observations/Feedback:  Patient did not attend group.   Julyanna Scholle LRT/CTRS        Alick Lecomte 01/18/2019 10:48 AM

## 2019-01-18 NOTE — Progress Notes (Signed)
D: Patient remains isolative to room. Did take all of his medication. Forwards very little in conversation. Denies SI, HI and AVH. Thought blocking. Affect flat. Mood is sad. Appears guarded and internally preoccupied. A: Continue to monitor for safety. R: Safety maintained.

## 2019-01-19 LAB — CBC WITH DIFFERENTIAL/PLATELET
Abs Immature Granulocytes: 0.07 10*3/uL (ref 0.00–0.07)
Basophils Absolute: 0 10*3/uL (ref 0.0–0.1)
Basophils Relative: 0 %
Eosinophils Absolute: 0 10*3/uL (ref 0.0–0.5)
Eosinophils Relative: 0 %
HCT: 45.7 % (ref 39.0–52.0)
Hemoglobin: 15.5 g/dL (ref 13.0–17.0)
Immature Granulocytes: 1 %
Lymphocytes Relative: 26 %
Lymphs Abs: 1.7 10*3/uL (ref 0.7–4.0)
MCH: 27.9 pg (ref 26.0–34.0)
MCHC: 33.9 g/dL (ref 30.0–36.0)
MCV: 82.2 fL (ref 80.0–100.0)
Monocytes Absolute: 0.6 10*3/uL (ref 0.1–1.0)
Monocytes Relative: 9 %
Neutro Abs: 4.1 10*3/uL (ref 1.7–7.7)
Neutrophils Relative %: 64 %
Platelets: 242 10*3/uL (ref 150–400)
RBC: 5.56 MIL/uL (ref 4.22–5.81)
RDW: 12.7 % (ref 11.5–15.5)
WBC: 6.4 10*3/uL (ref 4.0–10.5)
nRBC: 0 % (ref 0.0–0.2)

## 2019-01-19 LAB — VALPROIC ACID LEVEL: Valproic Acid Lvl: 63 ug/mL (ref 50.0–100.0)

## 2019-01-19 MED ORDER — AMLODIPINE BESYLATE 5 MG PO TABS
5.0000 mg | ORAL_TABLET | Freq: Every day | ORAL | Status: DC
  Administered 2019-01-19 – 2019-01-27 (×8): 5 mg via ORAL
  Filled 2019-01-19 (×9): qty 1

## 2019-01-19 NOTE — Progress Notes (Signed)
D: Patient has been isolative to room and self. Medication compliant. Received his Invega injection without complaints. Denies SI, HI and AVH. Affect is flat. Mood is sad. Patient is calm and cooperative. Out of room for snack only. A: Continue to monitor for safety. R: Safety maintained.

## 2019-01-19 NOTE — Progress Notes (Signed)
[facility]  01/19/2019 2:45 PM [name] [name]  MRN:  [id number] Subjective: Follow-up for patient with schizophrenia.  Once again, to no one surprise, I found the patient rolled up in a blanket in a dark room but wide awake in the afternoon.  Patient sat up and made eye contact and had some conversation.  Told me that he was feeling just fine.  Denied hallucinations.  Had no reason that he could express why he stayed so isolated.  He has gotten 2 injections of Invega long-acting and is on a reasonable sized dose of clozapine and yet still appears withdrawn flat and paranoid.  I also note that his blood pressure is staying elevated. Principal Problem: Schizoaffective disorder, bipolar type (HCC) Diagnosis: Principal Problem:   Schizoaffective disorder, bipolar type (HCC) Active Problems:   Aggressive behavior of adolescent   Noncompliance   Hypertension   Elevated CPK  Total Time spent with patient: 30 minutes  Past Psychiatric History: History of schizophrenia with rapid decline into significant negative symptoms  Past Medical History:  Past Medical History:  Diagnosis Date  . Asthma   . Depression   . Psychosis [facility](HCC)     Past Surgical History:  Procedure Laterality Date  . BACK SURGERY     Family History: History reviewed. No pertinent family history. Family Psychiatric  History: None significant Social History:  Social History   Substance and Sexual Activity  Alcohol Use No     Social History   Substance and Sexual Activity  Drug Use Yes  . Types: Marijuana    Social History   Socioeconomic History  . Marital status: Single    Spouse name: Not on file  . Number of children: Not on file  . Years of education: Not on file  . Highest education level: Not on file  Occupational History  . Not on file  Social Needs  . Financial resource strain: Not on file  . Food insecurity    Worry: Not on file    Inability: Not on file  . Transportation needs   Medical: Not on file    Non-medical: Not on file  Tobacco Use  . Smoking status: Never Smoker  . Smokeless tobacco: Never Used  Substance and Sexual Activity  . Alcohol use: No  . Drug use: Yes    Types: Marijuana  . Sexual activity: Not Currently  Lifestyle  . Physical activity    Days per week: Not on file    Minutes per session: Not on file  . Stress: Not on file  Relationships  . Social Musicianconnections    Talks on phone: Not on file    Gets together: Not on file    Attends religious service: Not on file    Active member of club or organization: Not on file    Attends meetings of clubs or organizations: Not on file    Relationship status: Not on file  Other Topics Concern  . Not on file  Social History Narrative  . Not on file   Additional Social History:                         Sleep: Fair  Appetite:  Fair  Current Medications: Current Facility-Administered Medications  Medication Dose Route Frequency Provider Last Rate Last Dose  . acetaminophen (TYLENOL) tablet 650 mg  650 mg Oral Q6H PRN Antonieta Pertlary, Greg Lawson, MD      . alum & mag hydroxide-simeth (  MAALOX/MYLANTA) 200-200-20 MG/5ML suspension 30 mL  30 mL Oral Q4H PRN Sharma Covert, MD      . amLODipine (NORVASC) tablet 5 mg  5 mg Oral Daily Shatoya Roets T, MD      . cloZAPine (CLOZARIL) tablet 250 mg  250 mg Oral QHS Wendelyn Kiesling, Madie Reno, MD   250 mg at 01/18/19 2101  . divalproex (DEPAKOTE) DR tablet 500 mg  500 mg Oral Q12H Sharma Covert, MD   500 mg at 01/19/19 0907  . fluvoxaMINE (LUVOX) tablet 25 mg  25 mg Oral QHS Sharma Covert, MD   25 mg at 01/18/19 2101  . hydrOXYzine (ATARAX/VISTARIL) tablet 25 mg  25 mg Oral Q6H PRN Sharma Covert, MD   25 mg at 01/15/19 2249  . magnesium hydroxide (MILK OF MAGNESIA) suspension 15 mL  15 mL Oral Daily PRN Sharma Covert, MD      . nicotine (NICODERM CQ - dosed in mg/24 hr) patch 7 mg  7 mg Transdermal Daily Ayva Veilleux, Madie Reno, MD   7 mg at 01/10/19  1953  . traZODone (DESYREL) tablet 100 mg  100 mg Oral QHS PRN Sharma Covert, MD   100 mg at 01/15/19 2249    Lab Results:  Results for orders placed or performed during the hospital encounter of 01/02/19 (from the past 48 hour(s))  CBC with Differential/Platelet     Status: None   Collection Time: 01/19/19 10:10 AM  Result Value Ref Range   WBC 6.4 4.0 - 10.5 K/uL   RBC 5.56 4.22 - 5.81 MIL/uL   Hemoglobin 15.5 13.0 - 17.0 g/dL   HCT 45.7 39.0 - 52.0 %   MCV 82.2 80.0 - 100.0 fL   MCH 27.9 26.0 - 34.0 pg   MCHC 33.9 30.0 - 36.0 g/dL   RDW 12.7 11.5 - 15.5 %   Platelets 242 150 - 400 K/uL   nRBC 0.0 0.0 - 0.2 %   Neutrophils Relative % 64 %   Neutro Abs 4.1 1.7 - 7.7 K/uL   Lymphocytes Relative 26 %   Lymphs Abs 1.7 0.7 - 4.0 K/uL   Monocytes Relative 9 %   Monocytes Absolute 0.6 0.1 - 1.0 K/uL   Eosinophils Relative 0 %   Eosinophils Absolute 0.0 0.0 - 0.5 K/uL   Basophils Relative 0 %   Basophils Absolute 0.0 0.0 - 0.1 K/uL   Immature Granulocytes 1 %   Abs Immature Granulocytes 0.07 0.00 - 0.07 K/uL    Comment: Performed at [facility], [address] [place], [place] [address]    Blood Alcohol level:  Lab Results  Component Value Date   [facility] <10 01/01/2019   ETH <10 93/79/0240    Metabolic Disorder Labs: Lab Results  Component Value Date   HGBA1C 5.1 08/10/2018   MPG 99.67 08/10/2018   MPG 93.93 03/08/2018   No results found for: PROLACTIN Lab Results  Component Value Date   CHOL 175 08/10/2018   TRIG 103 08/10/2018   HDL 46 08/10/2018   CHOLHDL 3.8 08/10/2018   VLDL 21 08/10/2018   LDLCALC 108 (H) 08/10/2018   LDLCALC 168 (H) 03/08/2018    Physical Findings: AIMS:  , ,  ,  ,    CIWA:    COWS:     Musculoskeletal: Strength & Muscle Tone: decreased Gait & Station: normal Patient leans: N/A  Psychiatric Specialty Exam: Physical Exam  Nursing note and vitals reviewed. Constitutional: He appears well-developed and  well-nourished.  HENT:  Head: Normocephalic and atraumatic.  Eyes: Pupils are equal, round, and reactive to light. Conjunctivae are normal.  Neck: Normal range of motion.  Cardiovascular: Regular rhythm and normal heart sounds.  Respiratory: Effort normal.  GI: Soft.  Musculoskeletal: Normal range of motion.  Neurological: He is alert.  Skin: Skin is warm and dry.  Psychiatric: His affect is blunt. His speech is delayed. He is slowed. Cognition and memory are impaired. He expresses no homicidal and no suicidal ideation.    Review of Systems  Constitutional: Negative.   HENT: Negative.   Eyes: Negative.   Respiratory: Negative.   Cardiovascular: Negative.   Gastrointestinal: Negative.   Musculoskeletal: Negative.   Skin: Negative.   Neurological: Negative.   Psychiatric/Behavioral: Negative.     Blood pressure (!) 144/98, pulse (!) 104, temperature 98 F (36.7 C), temperature source Oral, resp. rate 18, height 6' (1.829 m), weight 94.3 kg, SpO2 100 %.Body mass index is 28.2 kg/m.  General Appearance: Casual  Eye Contact:  Minimal  Speech:  Slow  Volume:  Decreased  Mood:  Euthymic  Affect:  Flat  Thought Process:  Disorganized  Orientation:  Full (Time, Place, and Person)  Thought Content:  Illogical  Suicidal Thoughts:  No  Homicidal Thoughts:  No  Memory:  Immediate;   Fair Recent;   Poor Remote;   Poor  Judgement:  Impaired  Insight:  Shallow  Psychomotor Activity:  Decreased  Concentration:  Concentration: Poor  Recall:  Poor  Fund of Knowledge:  Fair  Language:  Fair  Akathisia:  No  Handed:  Right  AIMS (if indicated):     Assets:  Desire for Improvement Housing Physical Health Resilience  ADL's:  Impaired  Cognition:  Impaired,  Moderate  Sleep:  Number of Hours: 7.5     Treatment Plan Summary: Daily contact with patient to assess and evaluate symptoms and progress in treatment, Medication management and Plan Patient is still very flat and  withdrawn.  He eats his meals and mostly takes his medicine although sometimes he still picks and chooses which ones he will take them based on what appear to be psychotic criteria.  We will check a Depakote level and the clozapine level tomorrow.  I am adding amlodipine to his medicine to help with his blood pressure.  Once again told the patient that he needed to get up and show a little more activity so we could feel comfortable with discharge.  [facility]  Unknown whether we will get any kind of positive response.  [name] RasmussenJohn Jahayra Mazo, MD 01/19/2019, 2:45 PM

## 2019-01-19 NOTE — Progress Notes (Signed)
Recreation Therapy Notes   Date: 01/19/2019  Time: 9:30 am   Location: Craft room   Behavioral response: N/A   Intervention Topic: Necessities   Discussion/Intervention: Patient did not attend group.   Clinical Observations/Feedback:  Patient did not attend group.   Salvatore Shear LRT/CTRS        [name] [name] 01/19/2019 10:34 AM 

## 2019-01-19 NOTE — BHH Group Notes (Signed)
Feelings Around Diagnosis 01/19/2019 1PM  Type of Therapy/Topic:  Group Therapy:  Feelings about Diagnosis  Participation Level:  Did Not Attend   Description of Group:   This group will allow patients to explore their thoughts and feelings about diagnoses they have received. Patients will be guided to explore their level of understanding and acceptance of these diagnoses. Facilitator will encourage patients to process their thoughts and feelings about the reactions of others to their diagnosis and will guide patients in identifying ways to discuss their diagnosis with significant others in their lives. This group will be process-oriented, with patients participating in exploration of their own experiences, giving and receiving support, and processing challenge from other group members.   Therapeutic Goals: 1. Patient will demonstrate understanding of diagnosis as evidenced by identifying two or more symptoms of the disorder 2. Patient will be able to express two feelings regarding the diagnosis 3. Patient will demonstrate their ability to communicate their needs through discussion and/or role play  Summary of Patient Progress:       Therapeutic Modalities:   Cognitive Behavioral Therapy Brief Therapy Feelings Identification    Yvette Rack, LCSW 01/19/2019 1:37 PM

## 2019-01-19 NOTE — Plan of Care (Signed)
D- Patient alert and oriented. Patient presents in a pleasant mood on assessment stating that he slept ok last night and had no complaints or concerns to voice to this Probation officer. Patient denied any signs/symptoms of depression/anxiety. Patient also denied SI, HI, AVH, and pain at this time. Patient continues to isolate to his room except for meals. Patient had no stated goals for today.  A- Scheduled medications administered to patient, per MD orders. Support and encouragement provided.  Routine safety checks conducted every 15 minutes.  Patient informed to notify staff with problems or concerns.  R- No adverse drug reactions noted. Patient contracts for safety at this time. Patient compliant with medications and treatment plan. Patient receptive, calm, and cooperative. Patient interacts well with others on the unit.  Patient remains safe at this time.  Problem: Activity: Goal: Will verbalize the importance of balancing activity with adequate rest periods Outcome: Progressing   Problem: Education: Goal: Will be free of psychotic symptoms Outcome: Progressing Goal: Knowledge of the prescribed therapeutic regimen will improve Outcome: Progressing   Problem: Coping: Goal: Coping ability will improve Outcome: Progressing Goal: Will verbalize feelings Outcome: Progressing   Problem: Health Behavior/Discharge Planning: Goal: Compliance with prescribed medication regimen will improve Outcome: Progressing   Problem: Nutritional: Goal: Ability to achieve adequate nutritional intake will improve Outcome: Progressing   Problem: Role Relationship: Goal: Ability to communicate needs accurately will improve Outcome: Progressing Goal: Ability to interact with others will improve Outcome: Progressing   Problem: Safety: Goal: Ability to redirect hostility and anger into socially appropriate behaviors will improve Outcome: Progressing Goal: Ability to remain free from injury will improve Outcome:  Progressing   Problem: Self-Care: Goal: Ability to participate in self-care as condition permits will improve Outcome: Progressing   Problem: Self-Concept: Goal: Will verbalize positive feelings about self Outcome: Progressing

## 2019-01-19 NOTE — Plan of Care (Signed)
  Problem: Education: Goal: Will be free of psychotic symptoms Outcome: Progressing Goal: Knowledge of the prescribed therapeutic regimen will improve Outcome: Progressing   D: Patient has been isolative to room and self. Medication compliant. Received his Invega injection without complaints. Denies SI, HI and AVH. Affect is flat. Mood is sad. Patient is calm and cooperative. Out of room for snack only. A: Continue to monitor for safety. R: Safety maintained.

## 2019-01-20 MED ORDER — DESMOPRESSIN ACETATE 0.2 MG PO TABS
0.1000 mg | ORAL_TABLET | Freq: Every day | ORAL | Status: DC
  Administered 2019-01-20 – 2019-01-26 (×7): 0.1 mg via ORAL
  Filled 2019-01-20 (×8): qty 1

## 2019-01-20 NOTE — Progress Notes (Signed)
Patient alert and oriented x 4, affect is flat but he brightens upon approach,  no distress noted, thoughts are organized and coherent , he appears less anxious, he isolates to self, room noted smelling of urine and he appears dishelved he denies SI/HI/AVH complaint with medication regimen no distress noted, he was offered emotional support and encouragement to attend evening wrap up group.  Patient didn't attend evening wrap up. 15 minutes safety checks maintained.

## 2019-01-20 NOTE — Progress Notes (Signed)
Recreation Therapy Notes   Date: 01/20/2019  Time: 9:30 am  Location: Craft room  Behavioral response: Appropriate   Intervention Topic: Self-esteem   Discussion/Intervention:  Group content today was focused on self-esteem. Patient defined self-esteem and where it comes form. The group described reasons self-esteem is important. Individuals stated things that impact self-esteem and positive ways to improve self-esteem. The group participated in the intervention "Exploring Self-esteem" where patients were able to explore what things that makes them who they are. Clinical Observations/Feedback:  Patient came to group late due to unknown reasons. Individual participated in the intervention while in atttendance.  Lyllie Cobbins LRT/CTRS          [name] [name] 01/20/2019 11:12 AM

## 2019-01-20 NOTE — BHH Group Notes (Signed)
LCSW Group Therapy Note  01/20/2019 1:00 PM  Type of Therapy/Topic:  Group Therapy:  Emotion Regulation  Participation Level:  Minimal   Description of Group:   The purpose of this group is to assist patients in learning to regulate negative emotions and experience positive emotions. Patients will be guided to discuss ways in which they have been vulnerable to their negative emotions. These vulnerabilities will be juxtaposed with experiences of positive emotions or situations, and patients will be challenged to use positive emotions to combat negative ones. Special emphasis will be placed on coping with negative emotions in conflict situations, and patients will process healthy conflict resolution skills.  Therapeutic Goals: 1. Patient will identify two positive emotions or experiences to reflect on in order to balance out negative emotions 2. Patient will label two or more emotions that they find the most difficult to experience 3. Patient will demonstrate positive conflict resolution skills through discussion and/or role plays  Summary of Patient Progress: Patient attended group, however did not engage in group discussion. Patient did interrupt group to state "what start over?" CSW is unsure if pateint was confucsed about a skill being discussed and attempted to clarify what the patient was referring to but he would not.    Therapeutic Modalities:   Cognitive Behavioral Therapy Feelings Identification Dialectical Behavioral Therapy  Assunta Curtis, MSW, LCSW 01/20/2019 1:57 PM

## 2019-01-20 NOTE — Progress Notes (Signed)
D: Patient stated slept fair last night .Stated appetite  good and energy level   normal. Stated concentration good . Stated on Depression scale 0 , hopeless 0 and anxiety 0 .( low 0-10 high) Denies suicidal  homicidal ideations  .  No auditory hallucinations  No pain concerns . Appropriate ADL'S. Interacting with peers and staff. Verbalize  understanding  of medication received .Patient is not  attending unit programing , continue to work on Emotional and mental status  .  Patient mprovement / positive change noted . Continue to work on  coping , decision making and anxiety  concerns. Denies suicidal ideations   . Patient  able  to verbalize understanding  with information received . No anger  management  or safety concerns  noted this shift .  A: Encourage patient participation with unit programming . Instruction  Given on  Medication , verbalize understanding.  R: Voice no other concerns. Staff continue to monitor

## 2019-01-20 NOTE — Plan of Care (Signed)
Verbalize  understanding  of medication received .Patient is not  attending unit programing , continue to work on Emotional and mental status  .  Patient mprovement / positive change noted . Continue to work on  coping , decision making and anxiety  concerns. Denies suicidal ideations   . Patient  able  to verbalize understanding  with information received . No anger  management  or safety concerns  noted this shift . Problem: Education: Goal: Will be free of psychotic symptoms Outcome: Progressing Goal: Knowledge of the prescribed therapeutic regimen will improve Outcome: Progressing   Problem: Coping: Goal: Coping ability will improve Outcome: Progressing Goal: Will verbalize feelings Outcome: Progressing   Problem: Health Behavior/Discharge Planning: Goal: Compliance with prescribed medication regimen will improve Outcome: Progressing   Problem: Nutritional: Goal: Ability to achieve adequate nutritional intake will improve Outcome: Progressing   Problem: Role Relationship: Goal: Ability to communicate needs accurately will improve Outcome: Progressing Goal: Ability to interact with others will improve Outcome: Progressing   Problem: Safety: Goal: Ability to redirect hostility and anger into socially appropriate behaviors will improve Outcome: Progressing Goal: Ability to remain free from injury will improve Outcome: Progressing   Problem: Self-Care: Goal: Ability to participate in self-care as condition permits will improve Outcome: Progressing   Problem: Self-Concept: Goal: Will verbalize positive feelings about self Outcome: Progressing

## 2019-01-20 NOTE — BHH Group Notes (Signed)
[place] Group Notes:  (Nursing/MHT/Case Management/Adjunct)  Date:  01/20/2019  Time:  8:45 PM  Type of Therapy:  Group Therapy  Participation Level:  Did Not Attend  [name] [name] 01/20/2019, 8:45 PM

## 2019-01-20 NOTE — Progress Notes (Signed)
[facility]  01/20/2019 3:52 PM [name] [name]  MRN:  [id number] Subjective: Patient seen chart reviewed.  Patient made a little bit of progress today.  He attended a group and he actually on his own came out to my office and asked to speak to me.  He has been taking his medicine regularly.  Depakote level yesterday was in the low therapeutic range clozapine level still pending.  Nursing reports he had nocturnal enuresis last night.  Patient denies it but I suspect they are more accurate. Principal Problem: Schizoaffective disorder, bipolar type (HCC) Diagnosis: Principal Problem:   Schizoaffective disorder, bipolar type (HCC) Active Problems:   Aggressive behavior of adolescent   Noncompliance   Hypertension   Elevated CPK  Total Time spent with patient: 30 minutes  Past Psychiatric History: Schizophrenia multiple hospitalizations trouble with compliance  Past Medical History:  Past Medical History:  Diagnosis Date  . Asthma   . Depression   . Psychosis [facility](HCC)     Past Surgical History:  Procedure Laterality Date  . BACK SURGERY     Family History: History reviewed. No pertinent family history. Family Psychiatric  History: See previous Social History:  Social History   Substance and Sexual Activity  Alcohol Use No     Social History   Substance and Sexual Activity  Drug Use Yes  . Types: Marijuana    Social History   Socioeconomic History  . Marital status: Single    Spouse name: Not on file  . Number of children: Not on file  . Years of education: Not on file  . Highest education level: Not on file  Occupational History  . Not on file  Social Needs  . Financial resource strain: Not on file  . Food insecurity    Worry: Not on file    Inability: Not on file  . Transportation needs    Medical: Not on file    Non-medical: Not on file  Tobacco Use  . Smoking status: Never Smoker  . Smokeless tobacco: Never Used  Substance and Sexual Activity  .  Alcohol use: No  . Drug use: Yes    Types: Marijuana  . Sexual activity: Not Currently  Lifestyle  . Physical activity    Days per week: Not on file    Minutes per session: Not on file  . Stress: Not on file  Relationships  . Social Musicianconnections    Talks on phone: Not on file    Gets together: Not on file    Attends religious service: Not on file    Active member of club or organization: Not on file    Attends meetings of clubs or organizations: Not on file    Relationship status: Not on file  Other Topics Concern  . Not on file  Social History Narrative  . Not on file   Additional Social History:                         Sleep: Fair  Appetite:  Fair  Current Medications: Current Facility-Administered Medications  Medication Dose Route Frequency Provider Last Rate Last Dose  . acetaminophen (TYLENOL) tablet 650 mg  650 mg Oral Q6H PRN Antonieta Pertlary, Greg Lawson, MD      . alum & mag hydroxide-simeth (MAALOX/MYLANTA) 200-200-20 MG/5ML suspension 30 mL  30 mL Oral Q4H PRN Antonieta Pertlary, Greg Lawson, MD      . amLODipine (NORVASC) tablet 5 mg  5 mg  Oral Daily Jordie Skalsky, [name], MD   5 mg at 01/20/19 0754  . cloZAPine (CLOZARIL) tablet 250 mg  250 mg Oral QHS Vallen Calabrese, [name], MD   250 mg at 01/19/19 2221  . desmopressin (DDAVP) tablet 0.1 mg  0.1 mg Oral QHS Lashawn Bromwell T, MD      . divalproex (DEPAKOTE) DR tablet 500 mg  500 mg Oral Q12H Antonieta Pertlary, Greg Lawson, MD   500 mg at 01/20/19 0754  . fluvoxaMINE (LUVOX) tablet 25 mg  25 mg Oral QHS Antonieta Pertlary, Greg Lawson, MD   25 mg at 01/19/19 2221  . hydrOXYzine (ATARAX/VISTARIL) tablet 25 mg  25 mg Oral Q6H PRN Antonieta Pertlary, Greg Lawson, MD   25 mg at 01/15/19 2249  . magnesium hydroxide (MILK OF MAGNESIA) suspension 15 mL  15 mL Oral Daily PRN Antonieta Pertlary, Greg Lawson, MD      . nicotine (NICODERM CQ - dosed in mg/24 hr) patch 7 mg  7 mg Transdermal Daily Crissa Sowder, [name], MD   7 mg at 01/10/19 1953  . traZODone (DESYREL) tablet 100 mg  100 mg Oral QHS PRN  Antonieta Pertlary, Greg Lawson, MD   100 mg at 01/15/19 2249    Lab Results:  Results for orders placed or performed during the hospital encounter of 01/02/19 (from the past 48 hour(s))  CBC with Differential/Platelet     Status: None   Collection Time: 01/19/19 10:10 AM  Result Value Ref Range   WBC 6.4 4.0 - 10.5 K/uL   RBC 5.56 4.22 - 5.81 MIL/uL   Hemoglobin 15.5 13.0 - 17.0 g/dL   HCT 16.145.7 09.639.0 - 04.552.0 %   MCV 82.2 80.0 - 100.0 fL   MCH 27.9 26.0 - 34.0 pg   MCHC 33.9 30.0 - 36.0 g/dL   RDW 40.912.7 81.111.5 - 91.415.5 %   Platelets 242 150 - 400 K/uL   nRBC 0.0 0.0 - 0.2 %   Neutrophils Relative % 64 %   Neutro Abs 4.1 1.7 - 7.7 K/uL   Lymphocytes Relative 26 %   Lymphs Abs 1.7 0.7 - 4.0 K/uL   Monocytes Relative 9 %   Monocytes Absolute 0.6 0.1 - 1.0 K/uL   Eosinophils Relative 0 %   Eosinophils Absolute 0.0 0.0 - 0.5 K/uL   Basophils Relative 0 %   Basophils Absolute 0.0 0.0 - 0.1 K/uL   Immature Granulocytes 1 %   Abs Immature Granulocytes 0.07 0.00 - 0.07 K/uL    Comment: Performed at [facility], [address] [place], [place] [address]  Valproic acid level     Status: None   Collection Time: 01/19/19  3:42 PM  Result Value Ref Range   Valproic Acid Lvl 63 50.0 - 100.0 ug/mL    Comment: Performed at [facility], [address] [place], [place] [address]    Blood Alcohol level:  Lab Results  Component Value Date   [facility]   ETH <10 08/02/2018    Metabolic Disorder Labs: Lab Results  Component Value Date   HGBA1C 5.1 08/10/2018   MPG 99.67 08/10/2018   MPG 93.93 03/08/2018   No results found for: PROLACTIN Lab Results  Component Value Date   CHOL 175 08/10/2018   TRIG 103 08/10/2018   HDL 46 08/10/2018   CHOLHDL 3.8 08/10/2018   VLDL 21 08/10/2018   LDLCALC 108 (H) 08/10/2018   LDLCALC 168 (H) 03/08/2018    Physical Findings: AIMS:  , ,  ,  ,  CIWA:    COWS:     Musculoskeletal: Strength & Muscle Tone: within normal  limits Gait & Station: normal Patient leans: N/A  Psychiatric Specialty Exam: Physical Exam  Nursing note and vitals reviewed. Constitutional: He appears well-developed and well-nourished.  HENT:  Head: Normocephalic and atraumatic.  Eyes: Pupils are equal, round, and reactive to light. Conjunctivae are normal.  Neck: Normal range of motion.  Cardiovascular: Regular rhythm and normal heart sounds.  Respiratory: Effort normal.  GI: Soft.  Musculoskeletal: Normal range of motion.  Neurological: He is alert.  Skin: Skin is warm and dry.  Psychiatric: His affect is blunt. His speech is delayed. He is slowed. Cognition and memory are impaired. He expresses impulsivity. He expresses no homicidal and no suicidal ideation.    Review of Systems  Constitutional: Negative.   HENT: Negative.   Eyes: Negative.   Respiratory: Negative.   Cardiovascular: Negative.   Gastrointestinal: Negative.   Musculoskeletal: Negative.   Skin: Negative.   Neurological: Negative.   Psychiatric/Behavioral: Negative.     Blood pressure (!) 139/99, pulse (!) 105, temperature 98.6 F (37 C), temperature source Oral, resp. rate 18, height 6' (1.829 m), weight 94.3 kg, SpO2 100 %.Body mass index is 28.2 kg/m.  General Appearance: Casual  Eye Contact:  Good  Speech:  Slow  Volume:  Decreased  Mood:  Euthymic  Affect:  Constricted  Thought Process:  Coherent  Orientation:  Full (Time, Place, and Person)  Thought Content:  Logical  Suicidal Thoughts:  No  Homicidal Thoughts:  No  Memory:  Immediate;   Fair Recent;   Poor Remote;   Poor  Judgement:  Impaired  Insight:  Shallow  Psychomotor Activity:  Decreased  Concentration:  Concentration: Poor  Recall:  [employer] of Knowledge:  Fair  Language:  Fair  Akathisia:  No  Handed:  Right  AIMS (if indicated):     Assets:  Financial Resources/Insurance Physical Health Resilience  ADL's:  Impaired  Cognition:  Impaired,  Mild  Sleep:  Number of  Hours: 5.5     Treatment Plan Summary: Daily contact with patient to assess and evaluate symptoms and progress in treatment, Medication management and Plan continue medication. Supportive therapy. possible discharge if he continues to improve  Alethia Berthold, MD 01/20/2019, 3:52 PM

## 2019-01-20 NOTE — Plan of Care (Signed)
  Problem: Activity: Goal: Will verbalize the importance of balancing activity with adequate rest periods Outcome: Progressing  Patient verbalized importance of rest with activity.

## 2019-01-21 NOTE — Progress Notes (Signed)
Patient alert and oriented x 4, affect is blunted upon approach,  noted sleeping on the floor in the room, stated " I feel better on the floor"  thoughts are organized and coherent , he isolates to self, appears less dishelved he denies SI/HI/AVH complaint with medication regimen no distress noted, he was offered emotional support and encouragement to attend evening wrap up group. Patient didn't attend evening wrap up. 15 minutes safety checks maintained.

## 2019-01-21 NOTE — Progress Notes (Signed)
D: Patient stated slept good last night .Stated appetite is good and energy level  Is normal. Stated concentration is good . Stated on Depression scale , hopeless and anxiety .( low 0-10 high) Denies suicidal  homicidal ideations  .  No auditory hallucinations  No pain concerns . Appropriate ADL'S. Interacting with peers and staff. Encourage patient to take a bath. Verbalize  understanding  of medication received .Patient is not  attending unit programing , continue to work on Emotional and mental status  .  Patient mprovement / positive change noted . Continue to work on  coping , decision making and anxiety  concerns. Denies suicidal ideations . Patient  able  to verbalize understanding  with information received . No anger  management  or safety concerns  noted this shift .  A: Encourage patient participation with unit programming . Instruction  Given on  Medication , verbalize understanding.  R: Voice no other concerns. Staff continue to monitor

## 2019-01-21 NOTE — Plan of Care (Signed)
Encourage patient to take a bath. Verbalize  understanding  of medication received .Patient is not  attending unit programing , continue to work on Emotional and mental status  .  Patient mprovement / positive change noted . Continue to work on  coping , decision making and anxiety  concerns. Denies suicidal ideations   . Patient  able  to verbalize understanding  with information received . No anger  management  or safety concerns  noted this shift .   Problem: Activity: Goal: Will verbalize the importance of balancing activity with adequate rest periods Outcome: Progressing   Problem: Education: Goal: Will be free of psychotic symptoms Outcome: Progressing Goal: Knowledge of the prescribed therapeutic regimen will improve Outcome: Progressing   Problem: Coping: Goal: Coping ability will improve Outcome: Progressing Goal: Will verbalize feelings Outcome: Progressing   Problem: Health Behavior/Discharge Planning: Goal: Compliance with prescribed medication regimen will improve Outcome: Progressing   Problem: Nutritional: Goal: Ability to achieve adequate nutritional intake will improve Outcome: Progressing   Problem: Safety: Goal: Ability to redirect hostility and anger into socially appropriate behaviors will improve Outcome: Progressing Goal: Ability to remain free from injury will improve Outcome: Progressing   Problem: Self-Care: Goal: Ability to participate in self-care as condition permits will improve Outcome: Progressing   Problem: Self-Concept: Goal: Will verbalize positive feelings about self Outcome: Progressing

## 2019-01-21 NOTE — Progress Notes (Signed)
[facility]  01/21/2019 5:36 PM Emeline GinsMohmed A Hoeffner  MRN:  [id number] Subjective: Follow-up young man with schizophrenia.  Patient is showing a little bit more activity.  Still pretty withdrawn and flat with a lot of negative symptoms but interacting a little bit more.  Tolerating medicine. Principal Problem: Schizoaffective disorder, bipolar type (HCC) Diagnosis: Principal Problem:   Schizoaffective disorder, bipolar type (HCC) Active Problems:   Aggressive behavior of adolescent   Noncompliance   Hypertension   Elevated CPK  Total Time spent with patient: 20 minutes  Past Psychiatric History: Long history of schizophrenia multiple hospitalizations  Past Medical History:  Past Medical History:  Diagnosis Date  . Asthma   . Depression   . Psychosis [facility](HCC)     Past Surgical History:  Procedure Laterality Date  . BACK SURGERY     Family History: History reviewed. No pertinent family history. Family Psychiatric  History: None known Social History:  Social History   Substance and Sexual Activity  Alcohol Use No     Social History   Substance and Sexual Activity  Drug Use Yes  . Types: Marijuana    Social History   Socioeconomic History  . Marital status: Single    Spouse name: Not on file  . Number of children: Not on file  . Years of education: Not on file  . Highest education level: Not on file  Occupational History  . Not on file  Social Needs  . Financial resource strain: Not on file  . Food insecurity    Worry: Not on file    Inability: Not on file  . Transportation needs    Medical: Not on file    Non-medical: Not on file  Tobacco Use  . Smoking status: Never Smoker  . Smokeless tobacco: Never Used  Substance and Sexual Activity  . Alcohol use: No  . Drug use: Yes    Types: Marijuana  . Sexual activity: Not Currently  Lifestyle  . Physical activity    Days per week: Not on file    Minutes per session: Not on file  . Stress: Not on file   Relationships  . Social Musicianconnections    Talks on phone: Not on file    Gets together: Not on file    Attends religious service: Not on file    Active member of club or organization: Not on file    Attends meetings of clubs or organizations: Not on file    Relationship status: Not on file  Other Topics Concern  . Not on file  Social History Narrative  . Not on file   Additional Social History:                         Sleep: Fair  Appetite:  Poor  Current Medications: Current Facility-Administered Medications  Medication Dose Route Frequency Provider Last Rate Last Dose  . acetaminophen (TYLENOL) tablet 650 mg  650 mg Oral Q6H PRN Antonieta Pertlary, Greg Lawson, MD      . alum & mag hydroxide-simeth (MAALOX/MYLANTA) 200-200-20 MG/5ML suspension 30 mL  30 mL Oral Q4H PRN Antonieta Pertlary, Greg Lawson, MD      . amLODipine (NORVASC) tablet 5 mg  5 mg Oral Daily Shamarcus Hoheisel, [name], MD   5 mg at 01/20/19 0754  . cloZAPine (CLOZARIL) tablet 250 mg  250 mg Oral QHS Shanterria Franta, [name], MD   250 mg at 01/20/19 2144  . desmopressin (DDAVP) tablet 0.1  mg  0.1 mg Oral QHS Kaetlyn Noa T, MD   0.1 mg at 01/20/19 2145  . divalproex (DEPAKOTE) DR tablet 500 mg  500 mg Oral Q12H Antonieta Pertlary, Greg Lawson, MD   500 mg at 01/21/19 0815  . fluvoxaMINE (LUVOX) tablet 25 mg  25 mg Oral QHS Antonieta Pertlary, Greg Lawson, MD   25 mg at 01/20/19 2144  . hydrOXYzine (ATARAX/VISTARIL) tablet 25 mg  25 mg Oral Q6H PRN Antonieta Pertlary, Greg Lawson, MD   25 mg at 01/15/19 2249  . magnesium hydroxide (MILK OF MAGNESIA) suspension 15 mL  15 mL Oral Daily PRN Antonieta Pertlary, Greg Lawson, MD      . nicotine (NICODERM CQ - dosed in mg/24 hr) patch 7 mg  7 mg Transdermal Daily Hensley Aziz, [name], MD   7 mg at 01/10/19 1953  . traZODone (DESYREL) tablet 100 mg  100 mg Oral QHS PRN Antonieta Pertlary, Greg Lawson, MD   100 mg at 01/20/19 2144    Lab Results: No results found for this or any previous visit (from the past 48 hour(s)).  Blood Alcohol level:  Lab Results  Component  Value Date   ETH <10 01/01/2019   ETH <10 08/02/2018    Metabolic Disorder Labs: Lab Results  Component Value Date   HGBA1C 5.1 08/10/2018   MPG 99.67 08/10/2018   MPG 93.93 03/08/2018   No results found for: PROLACTIN Lab Results  Component Value Date   CHOL 175 08/10/2018   TRIG 103 08/10/2018   HDL 46 08/10/2018   CHOLHDL 3.8 08/10/2018   VLDL 21 08/10/2018   LDLCALC 108 (H) 08/10/2018   LDLCALC 168 (H) 03/08/2018    Physical Findings: AIMS:  , ,  ,  ,    CIWA:    COWS:     Musculoskeletal: Strength & Muscle Tone: within normal limits Gait & Station: normal Patient leans: N/A  Psychiatric Specialty Exam: Physical Exam  Nursing note and vitals reviewed. Constitutional: He appears well-developed and well-nourished.  HENT:  Head: Normocephalic and atraumatic.  Eyes: Pupils are equal, round, and reactive to light. Conjunctivae are normal.  Neck: Normal range of motion.  Cardiovascular: Regular rhythm and normal heart sounds.  Respiratory: Effort normal.  GI: Soft.  Musculoskeletal: Normal range of motion.  Neurological: He is alert.  Skin: Skin is warm and dry.  Psychiatric: His affect is blunt. His speech is delayed. He is slowed. Cognition and memory are impaired. He expresses inappropriate judgment. He expresses no homicidal and no suicidal ideation.    Review of Systems  Constitutional: Negative.   HENT: Negative.   Eyes: Negative.   Respiratory: Negative.   Cardiovascular: Negative.   Gastrointestinal: Negative.   Musculoskeletal: Negative.   Skin: Negative.   Neurological: Negative.   Psychiatric/Behavioral: Negative.     Blood pressure (!) 104/48, pulse (!) 109, temperature 98 F (36.7 C), temperature source Oral, resp. rate 18, height 6' (1.829 m), weight 94.3 kg, SpO2 100 %.Body mass index is 28.2 kg/m.  General Appearance: Casual  Eye Contact:  Fair  Speech:  Slow  Volume:  Decreased  Mood:  Euthymic  Affect:  Flat  Thought Process:   Coherent  Orientation:  Full (Time, Place, and Person)  Thought Content:  Logical  Suicidal Thoughts:  No  Homicidal Thoughts:  No  Memory:  Immediate;   Fair Recent;   Fair Remote;   Fair  Judgement:  Fair  Insight:  Fair  Psychomotor Activity:  Decreased  Concentration:  Concentration: Fair  Recall:  [employer] of Knowledge:  Fair  Language:  Fair  Akathisia:  No  Handed:  Right  AIMS (if indicated):     Assets:  Desire for Improvement  ADL's:  Intact  Cognition:  WNL  Sleep:  Number of Hours: 7.5     Treatment Plan Summary: Daily contact with patient to assess and evaluate symptoms and progress in treatment, Medication management and Plan Continue clozapine.  Check a blood level before the end of the week.  We still have [facility] referred [facility].  Alethia Berthold, MD 01/21/2019, 5:36 PM

## 2019-01-21 NOTE — [facility] Counselor (Signed)
CSW called [facility] to confirm that patient remained on the wait list.  CSW spoke with Baxter Flattery.  CSW asked if any additional information was needed and was informed "they will call you when they need more information".    CSW also spoke with Santiago Glad at [facility] to provide update that patient remains on the [facility] waitlist, however, he is coming out of his room more and psychiatrist is hopeful that the patient's behaviors may change.   Assunta Curtis, MSW, LCSW 01/21/2019 10:20 AM

## 2019-01-21 NOTE — Progress Notes (Signed)
Instructed patient to take a shower   Patient stated he kin da took a shower  Remains  To Isolate to room

## 2019-01-21 NOTE — Plan of Care (Signed)
  Problem: Education: Goal: Will be free of psychotic symptoms Outcome: Not Progressing  Patient appears to have S/S of psychotic symptoms.

## 2019-01-21 NOTE — [facility] Group Notes (Signed)
Balance In Life 01/21/2019 1PM  Type of Therapy/Topic:  Group Therapy:  Balance in Life  Participation Level:  Did Not Attend  Description of Group:   This group will address the concept of balance and how it feels and looks when one is unbalanced. Patients will be encouraged to process areas in their lives that are out of balance and identify reasons for remaining unbalanced. Facilitators will guide patients in utilizing problem-solving interventions to address and correct the stressor making their life unbalanced. Understanding and applying boundaries will be explored and addressed for obtaining and maintaining a balanced life. Patients will be encouraged to explore ways to assertively make their unbalanced needs known to significant others in their lives, using other group members and facilitator for support and feedback.  Therapeutic Goals: 1. Patient will identify two or more emotions or situations they have that consume much of in their lives. 2. Patient will identify signs/triggers that life has become out of balance:  3. Patient will identify two ways to set boundaries in order to achieve balance in their lives:  4. Patient will demonstrate ability to communicate their needs through discussion and/or role plays  Summary of Patient Progress:    Therapeutic Modalities:   Cognitive Behavioral Therapy Solution-Focused Therapy Assertiveness Training  Nattaly Yebra T Hser Belanger, LCSW  

## 2019-01-21 NOTE — Progress Notes (Signed)
Recreation Therapy Notes  Date: 01/21/2019  Time: 9:30 am   Location: Craft room   Behavioral response: N/A   Intervention Topic: Problem Solving  Discussion/Intervention: Patient did not attend group.   Clinical Observations/Feedback:  Patient did not attend group.   Kendell Gammon LRT/CTRS        Ariani Seier 01/21/2019 10:59 AM

## 2019-01-22 LAB — CLOZAPINE (CLOZARIL)
Clozapine Lvl: 678 ng/mL — ABNORMAL HIGH (ref 350–650)
NorClozapine: 149 ng/mL
Total(Cloz+Norcloz): 827 ng/mL

## 2019-01-22 MED ORDER — CLOZAPINE 100 MG PO TABS
300.0000 mg | ORAL_TABLET | Freq: Every day | ORAL | Status: DC
  Administered 2019-01-22 – 2019-01-24 (×3): 300 mg via ORAL
  Filled 2019-01-22 (×3): qty 3

## 2019-01-22 NOTE — Progress Notes (Signed)
Recreation Therapy Notes   Date: 01/22/2019  Time: 9:30 am   Location: Craft room   Behavioral response: N/A   Intervention Topic: Leisure  Discussion/Intervention: Patient did not attend group.   Clinical Observations/Feedback:  Patient did not attend group.   Tashari Schoenfelder LRT/CTRS        Jozlynn Plaia 01/22/2019 10:28 AM 

## 2019-01-22 NOTE — Tx Team (Signed)
Interdisciplinary Treatment and Diagnostic Plan Update  01/22/2019 Time of Session: 8:30 AM [name] [name] MRN: [id number]  Principal Diagnosis: Schizoaffective disorder, bipolar type (HCC)  Secondary Diagnoses: Principal Problem:   Schizoaffective disorder, bipolar type (HCC) Active Problems:   Aggressive behavior of adolescent   Noncompliance   Hypertension   Elevated CPK   Current Medications:  Current Facility-Administered Medications  Medication Dose Route Frequency Provider Last Rate Last Dose  . acetaminophen (TYLENOL) tablet 650 mg  650 mg Oral Q6H PRN Antonieta Pertlary, Greg Lawson, MD      . alum & mag hydroxide-simeth (MAALOX/MYLANTA) 200-200-20 MG/5ML suspension 30 mL  30 mL Oral Q4H PRN Antonieta Pertlary, Greg Lawson, MD      . amLODipine (NORVASC) tablet 5 mg  5 mg Oral Daily [name], [name], MD   5 mg at 01/22/19 0811  . cloZAPine (CLOZARIL) tablet 250 mg  250 mg Oral QHS [name], [name], MD   250 mg at 01/21/19 2209  . desmopressin (DDAVP) tablet 0.1 mg  0.1 mg Oral QHS [name], [name], MD   0.1 mg at 01/21/19 2210  . divalproex (DEPAKOTE) DR tablet 500 mg  500 mg Oral Q12H Antonieta Pertlary, Greg Lawson, MD   500 mg at 01/22/19 [id number]  . fluvoxaMINE (LUVOX) tablet 25 mg  25 mg Oral QHS Antonieta Pertlary, Greg Lawson, MD   25 mg at 01/21/19 2210  . hydrOXYzine (ATARAX/VISTARIL) tablet 25 mg  25 mg Oral Q6H PRN Antonieta Pertlary, Greg Lawson, MD   25 mg at 01/15/19 2249  . magnesium hydroxide (MILK OF MAGNESIA) suspension 15 mL  15 mL Oral Daily PRN Antonieta Pertlary, Greg Lawson, MD      . nicotine (NICODERM CQ - dosed in mg/24 hr) patch 7 mg  7 mg Transdermal Daily [name], [name], MD   7 mg at 01/10/19 1953  . traZODone (DESYREL) tablet 100 mg  100 mg Oral QHS PRN Antonieta Pertlary, Greg Lawson, MD   100 mg at 01/20/19 2144   PTA Medications: Medications Prior to Admission  Medication Sig Dispense Refill Last Dose  . benztropine (COGENTIN) 0.5 MG tablet Take 0.5 mg by mouth 2 (two) times daily.   Past Month at Unknown time  . cloZAPine (CLOZARIL)  100 MG tablet Take 3 tablets (300 mg total) by mouth at bedtime. 90 tablet 1` Past Month at Unknown time  . divalproex (DEPAKOTE) 500 MG DR tablet Take 1 tablet (500 mg total) by mouth every 12 (twelve) hours. 60 tablet 1 Past Month at Unknown time  . fluvoxaMINE (LUVOX) 25 MG tablet Take 1 tablet (25 mg total) by mouth at bedtime. 30 tablet 1 Past Month at Unknown time  . metFORMIN (GLUCOPHAGE) 500 MG tablet Take 1 tablet (500 mg total) by mouth daily with breakfast. 30 tablet 1 Past Month at Unknown time  . metoprolol tartrate (LOPRESSOR) 25 MG tablet Take 1 tablet (25 mg total) by mouth 2 (two) times daily. 60 tablet 1 Past Month at Unknown time  . traZODone (DESYREL) 100 MG tablet Take 100 mg by mouth at bedtime.   prn at prn    Patient Stressors: Financial difficulties Medication change or noncompliance  Patient Strengths: Motivation for treatment/growth Supportive family/friends  Treatment Modalities: Medication Management, Group therapy, Case management,  1 to 1 session with clinician, Psychoeducation, Recreational therapy.   Physician Treatment Plan for Primary Diagnosis: Schizoaffective disorder, bipolar type (HCC) Long Term Goal(s): Improvement in symptoms so as ready for discharge Improvement in symptoms so as ready for discharge  Short Term Goals: Ability to identify changes in lifestyle to reduce recurrence of condition will improve Ability to verbalize feelings will improve Ability to demonstrate self-control will improve Ability to identify and develop effective coping behaviors will improve Ability to maintain clinical measurements within normal limits will improve Compliance with prescribed medications will improve Ability to identify changes in lifestyle to reduce recurrence of condition will improve Ability to verbalize feelings will improve Ability to demonstrate self-control will improve Ability to identify and develop effective coping behaviors will  improve Ability to maintain clinical measurements within normal limits will improve Compliance with prescribed medications will improve  Medication Management: Evaluate patient's response, side effects, and tolerance of medication regimen.  Therapeutic Interventions: 1 to 1 sessions, Unit Group sessions and Medication administration.  Evaluation of Outcomes: Not Progressing   Physician Treatment Plan for Secondary Diagnosis: Principal Problem:   Schizoaffective disorder, bipolar type ([place]) Active Problems:   Aggressive behavior of adolescent   Noncompliance   Hypertension   Elevated CPK  Long Term Goal(s): Improvement in symptoms so as ready for discharge Improvement in symptoms so as ready for discharge   Short Term Goals: Ability to identify changes in lifestyle to reduce recurrence of condition will improve Ability to verbalize feelings will improve Ability to demonstrate self-control will improve Ability to identify and develop effective coping behaviors will improve Ability to maintain clinical measurements within normal limits will improve Compliance with prescribed medications will improve Ability to identify changes in lifestyle to reduce recurrence of condition will improve Ability to verbalize feelings will improve Ability to demonstrate self-control will improve Ability to identify and develop effective coping behaviors will improve Ability to maintain clinical measurements within normal limits will improve Compliance with prescribed medications will improve     Medication Management: Evaluate patient's response, side effects, and tolerance of medication regimen.  Therapeutic Interventions: 1 to 1 sessions, Unit Group sessions and Medication administration.  Evaluation of Outcomes: Not Progressing   RN Treatment Plan for Primary Diagnosis: Schizoaffective disorder, bipolar type ([place]) Long Term Goal(s): Knowledge of disease and therapeutic regimen to maintain  health will improve  Short Term Goals: Ability to participate in decision making will improve, Ability to verbalize feelings will improve, Ability to identify and develop effective coping behaviors will improve and Compliance with prescribed medications will improve  Medication Management: RN will administer medications as ordered by provider, will assess and evaluate patient's response and provide education to patient for prescribed medication. RN will report any adverse and/or side effects to prescribing provider.  Therapeutic Interventions: 1 on 1 counseling sessions, Psychoeducation, Medication administration, Evaluate responses to treatment, Monitor vital signs and CBGs as ordered, Perform/monitor CIWA, COWS, AIMS and Fall Risk screenings as ordered, Perform wound care treatments as ordered.  Evaluation of Outcomes: Not Progressing   LCSW Treatment Plan for Primary Diagnosis: Schizoaffective disorder, bipolar type ([place]) Long Term Goal(s): Safe transition to appropriate next level of care at discharge, Engage patient in therapeutic group addressing interpersonal concerns.  Short Term Goals: Engage patient in aftercare planning with referrals and resources, Increase emotional regulation, Facilitate acceptance of mental health diagnosis and concerns and Increase skills for wellness and recovery  Therapeutic Interventions: Assess for all discharge needs, 1 to 1 time with Social worker, Explore available resources and support systems, Assess for adequacy in community support network, Educate family and significant other(s) on suicide prevention, Complete Psychosocial Assessment, Interpersonal group therapy.  Evaluation of Outcomes: Not Progressing   Progress in Treatment: Attending groups: No. Participating  in groups: No. Taking medication as prescribed: No. Received forced invega shot Toleration medication: Yes. Family/Significant other contact made: No, will contact:  pt declined  collateral contact Patient understands diagnosis: Yes. Discussing patient identified problems/goals with staff: Yes. Medical problems stabilized or resolved: No. Denies suicidal/homicidal ideation: Yes. Issues/concerns per patient self-inventory: No. Other: N/A  New problem(s) identified: No, Describe:  none  New Short Term/Long Term Goal(s): medication management for mood stabilization; development of comprehensive mental wellness/sobriety plan.  Patient Goals:  "to bet better, where to go from this, and not have to come back again"  Discharge Plan or Barriers: SPE pamphlet, Mobile Crisis information, and AA/NA information provided to patient for additional community support and resources. Pt is agreeable to follow up with his current [facility] team. Update 01/08/2019:  Per reports in progression, patient has begun to decline to take his medications.  Pt remains isolative and does not attend group or the milieu of the unit. 01/13/19-pt has received forced invega shot. Minimal progression at this time, pt still isolates to his room. Pt has Database administratoraster seals ACTT. UPDATE 01/18/19 - [facility], pt is currently on the wait list. Update 01/22/2019:  [facility]  CSW has sent in supporting documents.  Patient has begun to come out of his room and is seen more on the unit.   Reason for Continuation of Hospitalization: Depression Medication stabilization   Estimated Length of Stay: TBD  Recreational Therapy: Patient Stressors: N/A  Patient Goal: Patient will engage in groups without prompting or encouragement from LRT x3 group sessions within 5 recreation therapy group sessions  Attendees: Patient: [name] [name] 01/22/2019 11:07 AM  Physician: Dr. Toni Amendlapacs, MD 01/22/2019 11:07 AM  Nursing:  01/22/2019 11:07 AM  RN Care Manager: 01/22/2019 11:07 AM  Social Worker: Penni HomansMichaela Donielle Kaigler LCSW 01/22/2019 11:07 AM  Recreational Therapist:  01/22/2019 11:07 AM  Other:   01/22/2019 11:07 AM  Other:  01/22/2019 11:07 AM  Other: 01/22/2019 11:07 AM    Scribe for Treatment Team: Harden MoMichaela J Niguel Moure, LCSW 01/22/2019 11:07 AM

## 2019-01-22 NOTE — [facility] Group Notes (Signed)

## 2019-01-22 NOTE — Progress Notes (Signed)
[facility] MD Progress Note  01/22/2019 5:00 PM [name] [name]  MRN:  [id number] Subjective: Follow-up young man with schizophrenia.  Patient is up out of his bed a little bit more.  Taking care of his hygiene better.  Still does not talk very much but answers questions when you ask him.  Denies that he is having hallucinations.  Not acting bizarrely just very withdrawn. Principal Problem: Schizoaffective disorder, bipolar type ([place]) Diagnosis: Principal Problem:   Schizoaffective disorder, bipolar type ([place]) Active Problems:   Aggressive behavior of adolescent   Noncompliance   Hypertension   Elevated CPK  Total Time spent with patient: 20 minutes  Past Psychiatric History: History of schizophrenia with multiple hospitalizations due to noncompliance  Past Medical History:  Past Medical History:  Diagnosis Date  . Asthma   . Depression   . Psychosis [facility])     Past Surgical History:  Procedure Laterality Date  . BACK SURGERY     Family History: History reviewed. No pertinent family history. Family Psychiatric  History: See previous Social History:  Social History   Substance and Sexual Activity  Alcohol Use No     Social History   Substance and Sexual Activity  Drug Use Yes  . Types: Marijuana    Social History   Socioeconomic History  . Marital status: Single    Spouse name: Not on file  . Number of children: Not on file  . Years of education: Not on file  . Highest education level: Not on file  Occupational History  . Not on file  Social Needs  . Financial resource strain: Not on file  . Food insecurity    Worry: Not on file    Inability: Not on file  . Transportation needs    Medical: Not on file    Non-medical: Not on file  Tobacco Use  . Smoking status: Never Smoker  . Smokeless tobacco: Never Used  Substance and Sexual Activity  . Alcohol use: No  . Drug use: Yes    Types: Marijuana  . Sexual activity: Not Currently  Lifestyle  . Physical activity     Days per week: Not on file    Minutes per session: Not on file  . Stress: Not on file  Relationships  . Social Herbalist on phone: Not on file    Gets together: Not on file    Attends religious service: Not on file    Active member of club or organization: Not on file    Attends meetings of clubs or organizations: Not on file    Relationship status: Not on file  Other Topics Concern  . Not on file  Social History Narrative  . Not on file   Additional Social History:                         Sleep: Fair  Appetite:  Fair  Current Medications: Current Facility-Administered Medications  Medication Dose Route Frequency Provider Last Rate Last Dose  . acetaminophen (TYLENOL) tablet 650 mg  650 mg Oral Q6H PRN Sharma Covert, MD      . alum & mag hydroxide-simeth (MAALOX/MYLANTA) 200-200-20 MG/5ML suspension 30 mL  30 mL Oral Q4H PRN Sharma Covert, MD      . amLODipine (NORVASC) tablet 5 mg  5 mg Oral Daily Kippy Melena, Madie Reno, MD   5 mg at 01/22/19 0811  . cloZAPine (CLOZARIL) tablet 300 mg  300  mg Oral QHS Duncan Alejandro T, MD      . desmopressin (DDAVP) tablet 0.1 mg  0.1 mg Oral QHS Omah Dewalt T, MD   0.1 mg at 01/21/19 2210  . divalproex (DEPAKOTE) DR tablet 500 mg  500 mg Oral Q12H Antonieta Pertlary, Greg Lawson, MD   500 mg at 01/22/19 16100811  . fluvoxaMINE (LUVOX) tablet 25 mg  25 mg Oral QHS Antonieta Pertlary, Greg Lawson, MD   25 mg at 01/21/19 2210  . hydrOXYzine (ATARAX/VISTARIL) tablet 25 mg  25 mg Oral Q6H PRN Antonieta Pertlary, Greg Lawson, MD   25 mg at 01/15/19 2249  . magnesium hydroxide (MILK OF MAGNESIA) suspension 15 mL  15 mL Oral Daily PRN Antonieta Pertlary, Greg Lawson, MD      . nicotine (NICODERM CQ - dosed in mg/24 hr) patch 7 mg  7 mg Transdermal Daily Raunel Dimartino, [name], MD   7 mg at 01/10/19 1953  . traZODone (DESYREL) tablet 100 mg  100 mg Oral QHS PRN Antonieta Pertlary, Greg Lawson, MD   100 mg at 01/20/19 2144    Lab Results: No results found for this or any previous visit (from the  past 48 hour(s)).  Blood Alcohol level:  Lab Results  Component Value Date   ETH <10 01/01/2019   ETH <10 08/02/2018    Metabolic Disorder Labs: Lab Results  Component Value Date   HGBA1C 5.1 08/10/2018   MPG 99.67 08/10/2018   MPG 93.93 03/08/2018   No results found for: PROLACTIN Lab Results  Component Value Date   CHOL 175 08/10/2018   TRIG 103 08/10/2018   HDL 46 08/10/2018   CHOLHDL 3.8 08/10/2018   VLDL 21 08/10/2018   LDLCALC 108 (H) 08/10/2018   LDLCALC 168 (H) 03/08/2018    Physical Findings: AIMS:  , ,  ,  ,    CIWA:    COWS:     Musculoskeletal: Strength & Muscle Tone: within normal limits Gait & Station: normal Patient leans: N/A  Psychiatric Specialty Exam: Physical Exam  Nursing note and vitals reviewed. Constitutional: He appears well-developed and well-nourished.  HENT:  Head: Normocephalic and atraumatic.  Eyes: Pupils are equal, round, and reactive to light. Conjunctivae are normal.  Neck: Normal range of motion.  Cardiovascular: Regular rhythm and normal heart sounds.  Respiratory: Effort normal.  GI: Soft.  Musculoskeletal: Normal range of motion.  Neurological: He is alert.  Skin: Skin is warm and dry.  Psychiatric: His affect is blunt. His speech is delayed. He is slowed. He expresses no homicidal and no suicidal ideation.    Review of Systems  Constitutional: Negative.   HENT: Negative.   Eyes: Negative.   Respiratory: Negative.   Cardiovascular: Negative.   Gastrointestinal: Negative.   Musculoskeletal: Negative.   Skin: Negative.   Neurological: Negative.   Psychiatric/Behavioral: Negative.     Blood pressure (!) 148/87, pulse (!) 124, temperature 98.8 F (37.1 C), temperature source Oral, resp. rate 18, height 6' (1.829 m), weight 94.3 kg, SpO2 100 %.Body mass index is 28.2 kg/m.  General Appearance: Casual  Eye Contact:  Good  Speech:  Slow  Volume:  Decreased  Mood:  Euthymic  Affect:  Flat  Thought Process:   Coherent  Orientation:  Full (Time, Place, and Person)  Thought Content:  Tangential  Suicidal Thoughts:  No  Homicidal Thoughts:  No  Memory:  Immediate;   Fair Recent;   Fair Remote;   Fair  Judgement:  Fair  Insight:  Fair  Psychomotor Activity:  Decreased  Concentration:  Concentration: Fair  Recall:  Fis[employer]Fair  Fund of Knowledge:  Fair  Language:  Fair  Akathisia:  No  Handed:  Right  AIMS (if indicated):     Assets:  Financial Resources/Insurance Social Support  ADL's:  Intact  Cognition:  Impaired,  Mild  Sleep:  Number of Hours: 8.75     Treatment Plan Summary: Daily contact with patient to assess and evaluate symptoms and progress in treatment, Medication management and Plan I had been gradually increasing his clozapine dose.  We will go up to 300 which is his previous clozapine dose.  He has gotten to in ParchmentVega injections.  Overall seems to be improving.  As I told the patient, I still think that [facility] him but I know that is not what he prefers.  If he looks like he is continuing to come back to something like his old baseline we might consider discharge by next week.  Mordecai RasmussenJohn Davian Hanshaw, MD 01/22/2019, 5:00 PM

## 2019-01-22 NOTE — Progress Notes (Signed)
D - Patient was in his room upon arrival to the unit. Patient was pleasant during assessment and medication administration. Patient denies SI/HI/AVH, pain, anxiety and depression with this Probation officer. Patient was isolative to his room this evening. Patient said he had a good day and was feeling better. Patient said he just felt like sleeping more this evening.   A - Patient compliant with medication administration per MD orders and procedures on the unit. Patient given education. Patient given support and encouragement to be active in his treatment plan. Patient informed to let staff know if there are any issues or problems on the unit.   R- Patient being monitored Q 15 minutes for safety per unit protocol. Patient remains safe on the unit.

## 2019-01-22 NOTE — Plan of Care (Signed)
Patient was more visible in the milieu.Patient is receptive on approach.Denies SI,HI and AVH.Patient answer with "yes or no" and reluctant to continue a conversation with staff.Compliant with medications.Appetite and energy level good.Personal hygiene maintained.Support and encouragement given.

## 2019-01-22 NOTE — Plan of Care (Signed)
Patient compliant with medication administration but was isolative to his room this evening. Patient was not observed interacting with staff or peers this evening other than with this writer during assessment and medication administration.   Problem: Education: Goal: Knowledge of the prescribed therapeutic regimen will improve Outcome: Not Progressing

## 2019-01-23 MED ORDER — TRAZODONE HCL 50 MG PO TABS
150.0000 mg | ORAL_TABLET | Freq: Every day | ORAL | Status: DC
  Administered 2019-01-23 – 2019-01-26 (×4): 150 mg via ORAL
  Filled 2019-01-23 (×4): qty 1

## 2019-01-23 NOTE — Plan of Care (Signed)
Patient is visible in the milieu,pacing in the hallway at times.Stated that he is doing well.Patient is calm and cooperative on approach.Denies SI,HI and AVH.Compliant with medications.Appetite and energy level good.Support and encouragement given.

## 2019-01-23 NOTE — Plan of Care (Signed)
  Problem: Education: Goal: Will be free of psychotic symptoms Outcome: Progressing  Patient appears less psychotic  

## 2019-01-23 NOTE — Progress Notes (Signed)
Patient alert and oriented x 4, affect is flat but he brightens upon approach, calm, polite and pleasant no distress noted, thoughts are organized and coherent he appears less anxious, he interacted with peers and staff and he denies SI/HI/AVH. Patient is  complaint with medication regimen no distress noted, he was offered emotional support and encouragement to attend evening wrap up group. Patient didn't attend evening wrap up. 15 minutes safety checks maintained.

## 2019-01-23 NOTE — Progress Notes (Signed)
[facility]  01/23/2019 8:37 AM [name] [name]  MRN:  [id number] Subjective:   Patient seen resting in his room he is currently alert oriented to person place time situation he denies current positive symptoms, has no thoughts of harming self or others and he can contract for safety while here and understands what that means.  No EPS or TD.  Tolerating clozapine well.  Also has long-acting injectable paliperidone on board according to notes. Principal Problem: Schizoaffective disorder, bipolar type (HCC) Diagnosis: Principal Problem:   Schizoaffective disorder, bipolar type (HCC) Active Problems:   Aggressive behavior of adolescent   Noncompliance   Hypertension   Elevated CPK  Total Time spent with patient: 20 minutes   Past Medical History:  Past Medical History:  Diagnosis Date  . Asthma   . Depression   . Psychosis [facility](HCC)     Past Surgical History:  Procedure Laterality Date  . BACK SURGERY     Family History: History reviewed. No pertinent family history. Family Psychiatric  History: no new data Social History:  Social History   Substance and Sexual Activity  Alcohol Use No     Social History   Substance and Sexual Activity  Drug Use Yes  . Types: Marijuana    Social History   Socioeconomic History  . Marital status: Single    Spouse name: Not on file  . Number of children: Not on file  . Years of education: Not on file  . Highest education level: Not on file  Occupational History  . Not on file  Social Needs  . Financial resource strain: Not on file  . Food insecurity    Worry: Not on file    Inability: Not on file  . Transportation needs    Medical: Not on file    Non-medical: Not on file  Tobacco Use  . Smoking status: Never Smoker  . Smokeless tobacco: Never Used  Substance and Sexual Activity  . Alcohol use: No  . Drug use: Yes    Types: Marijuana  . Sexual activity: Not Currently  Lifestyle  . Physical activity    Days per  week: Not on file    Minutes per session: Not on file  . Stress: Not on file  Relationships  . Social Musicianconnections    Talks on phone: Not on file    Gets together: Not on file    Attends religious service: Not on file    Active member of club or organization: Not on file    Attends meetings of clubs or organizations: Not on file    Relationship status: Not on file  Other Topics Concern  . Not on file  Social History Narrative  . Not on file   Additional Social History:                         Sleep: Good  Appetite:  Good  Current Medications: Current Facility-Administered Medications  Medication Dose Route Frequency Provider Last Rate Last Dose  . acetaminophen (TYLENOL) tablet 650 mg  650 mg Oral Q6H PRN Antonieta Pertlary, Greg Lawson, MD      . alum & mag hydroxide-simeth (MAALOX/MYLANTA) 200-200-20 MG/5ML suspension 30 mL  30 mL Oral Q4H PRN Antonieta Pertlary, Greg Lawson, MD      . amLODipine (NORVASC) tablet 5 mg  5 mg Oral Daily [name], [name], MD   5 mg at 01/23/19 0813  . cloZAPine (CLOZARIL) tablet 300 mg  300 mg Oral QHS [name], Madie Reno, MD   300 mg at 01/22/19 2223  . desmopressin (DDAVP) tablet 0.1 mg  0.1 mg Oral QHS [name], [name], MD   0.1 mg at 01/22/19 2100  . divalproex (DEPAKOTE) DR tablet 500 mg  500 mg Oral Q12H Sharma Covert, MD   500 mg at 01/23/19 0813  . fluvoxaMINE (LUVOX) tablet 25 mg  25 mg Oral QHS Sharma Covert, MD   25 mg at 01/22/19 2223  . hydrOXYzine (ATARAX/VISTARIL) tablet 25 mg  25 mg Oral Q6H PRN Sharma Covert, MD   25 mg at 01/15/19 2249  . magnesium hydroxide (MILK OF MAGNESIA) suspension 15 mL  15 mL Oral Daily PRN Sharma Covert, MD      . nicotine (NICODERM CQ - dosed in mg/24 hr) patch 7 mg  7 mg Transdermal Daily [name], Madie Reno, MD   7 mg at 01/10/19 1953  . traZODone (DESYREL) tablet 100 mg  100 mg Oral QHS PRN Sharma Covert, MD   100 mg at 01/22/19 2223    Lab Results: No results found for this or any previous visit  (from the past 48 hour(s)).  Blood Alcohol level:  Lab Results  Component Value Date   ETH <10 01/01/2019   ETH <10 29/93/7169    Metabolic Disorder Labs: Lab Results  Component Value Date   HGBA1C 5.1 08/10/2018   MPG 99.67 08/10/2018   MPG 93.93 03/08/2018   No results found for: PROLACTIN Lab Results  Component Value Date   CHOL 175 08/10/2018   TRIG 103 08/10/2018   HDL 46 08/10/2018   CHOLHDL 3.8 08/10/2018   VLDL 21 08/10/2018   LDLCALC 108 (H) 08/10/2018   LDLCALC 168 (H) 03/08/2018    Physical Findings: AIMS:  , ,  ,  ,    CIWA:    COWS:     Musculoskeletal: Strength & Muscle Tone: within normal limits Gait & Station: normal Patient leans: N/A  Psychiatric Specialty Exam: Physical Exam  ROS  Blood pressure (!) 148/87, pulse (!) 124, temperature 98.8 F (37.1 C), temperature source Oral, resp. rate 18, height 6' (1.829 m), weight 94.3 kg, SpO2 100 %.Body mass index is 28.2 kg/m.  General Appearance: Casual  Eye Contact:  Good  Speech:  Clear and Coherent  Volume:  Decreased  Mood:  Euthymic  Affect:  Appropriate and Congruent  Thought Process:  Goal Directed and Descriptions of Associations: Tangential  Orientation:  Full (Time, Place, and Person)  Thought Content:  Logical  Suicidal Thoughts:  No  Homicidal Thoughts:  No  Memory:  Immediate;   Fair  Judgement:  Fair  Insight:  Fair  Psychomotor Activity:  Decreased  Concentration:  Concentration: Fair  Recall:  [employer] of Knowledge:  Fair  Language:  Fair  Akathisia:  Negative  Handed:  Right  AIMS (if indicated):     Assets:  Physical Health Resilience Social Support  ADL's:  Intact  Cognition:  WNL  Sleep:  Number of Hours: 3.15     Treatment Plan Summary: Daily contact with patient to assess and evaluate symptoms and progress in treatment, Medication management and Plan continue current precautions and clozapine- add sleep aid/cont reality based therapy   Jamilex Bohnsack,  MD 01/23/2019, 8:37 AM

## 2019-01-23 NOTE — B[facility] Group Notes (Signed)
LCSW Group Therapy Note   01/23/2019 1:15pm   Type of Therapy and Topic:  Group Therapy:  Trust and Honesty  Participation Level:  Active  Description of Group:    In this group patients will be asked to explore the value of being honest.  Patients will be guided to discuss their thoughts, feelings, and behaviors related to honesty and trusting in others. Patients will process together how trust and honesty relate to forming relationships with peers, family members, and self. Each patient will be challenged to identify and express feelings of being vulnerable. Patients will discuss reasons why people are dishonest and identify alternative outcomes if one was truthful (to self or others). This group will be process-oriented, with patients participating in exploration of their own experiences, giving and receiving support, and processing challenge from other group members.   Therapeutic Goals: 1. Patient will identify why honesty is important to relationships and how honesty overall affects relationships.  2. Patient will identify a situation where they lied or were lied too and the  feelings, thought process, and behaviors surrounding the situation 3. Patient will identify the meaning of being vulnerable, how that feels, and how that correlates to being honest with self and others. 4. Patient will identify situations where they could have told the truth, but instead lied and explain reasons of dishonesty.   Summary of Patient Progress The patient was able to explore the value of being honest.  Patient discussed thoughts, feelings, and behaviors related to honesty and trusting in others. The patient processed together with other group members how trust and honesty relate to forming relationships with peers, family members, and self. Pt actively and appropriately engaged in the group. Patient was able to provide support and validation to other group members. Patient practiced active listening when  interacting with the facilitator and other group members.    Therapeutic Modalities:   Cognitive Behavioral Therapy Solution Focused Therapy Motivational Interviewing Brief Therapy  Leondre Taul  CUEBAS-COLON, LCSW 01/23/2019 12:38 PM  

## 2019-01-24 NOTE — B[facility] Group Notes (Signed)
LCSW Group Therapy Note 01/24/2019 1:15pm  Type of Therapy and Topic: Group Therapy: Feelings Around Returning Home & Establishing a Supportive Framework and Supporting Oneself When Supports Not Available  Participation Level: Did Not Attend  Description of Group:  Patients first processed thoughts and feelings about upcoming discharge. These included fears of upcoming changes, lack of change, new living environments, judgements and expectations from others and overall stigma of mental health issues. The group then discussed the definition of a supportive framework, what that looks and feels like, and how do to discern it from an unhealthy non-supportive network. The group identified different types of supports as well as what to do when your family/friends are less than helpful or unavailable  Therapeutic Goals  1. Patient will identify one healthy supportive network that they can use at discharge. 2. Patient will identify one factor of a supportive framework and how to tell it from an unhealthy network. 3. Patient able to identify one coping skill to use when they do not have positive supports from others. 4. Patient will demonstrate ability to communicate their needs through discussion and/or role plays.  Summary of Patient Progress:  Pt was invited to attend group but chose not to attend. CSW will continue to encourage pt to attend group throughout their admission.    Therapeutic Modalities Cognitive Behavioral Therapy Motivational Interviewing   Ocie Stanzione  CUEBAS-COLON, LCSW 01/24/2019 12:45 PM

## 2019-01-24 NOTE — Plan of Care (Signed)
Visible in the milieu. Limited interactions with peers but calm and cooperative. Compliant with treatment. No sign of distress.

## 2019-01-24 NOTE — Progress Notes (Signed)
[place] MD Progress Note  01/24/2019 8:31 AM [name] [name]  MRN:  [id number] Subjective:   Patient reports continued stability of mood, reports no current auditory or visual hallucinations, no delusional beliefs discerned.  No EPS or TD.  Resting comfortably in bed, somewhat passive but does engage in basic med and illness education.  No thoughts of harming self or others. Principal Problem: Schizoaffective disorder, bipolar type ([place]) Diagnosis: Principal Problem:   Schizoaffective disorder, bipolar type ([place]) Active Problems:   Aggressive behavior of adolescent   Noncompliance   Hypertension   Elevated CPK  Total Time spent with patient: 20 minutes  Past Medical History:  Past Medical History:  Diagnosis Date  . Asthma   . Depression   . Psychosis [facility])     Past Surgical History:  Procedure Laterality Date  . BACK SURGERY     Family History: History reviewed. No pertinent family history.  Social History:  Social History   Substance and Sexual Activity  Alcohol Use No     Social History   Substance and Sexual Activity  Drug Use Yes  . Types: Marijuana    Social History   Socioeconomic History  . Marital status: Single    Spouse name: Not on file  . Number of children: Not on file  . Years of education: Not on file  . Highest education level: Not on file  Occupational History  . Not on file  Social Needs  . Financial resource strain: Not on file  . Food insecurity    Worry: Not on file    Inability: Not on file  . Transportation needs    Medical: Not on file    Non-medical: Not on file  Tobacco Use  . Smoking status: Never Smoker  . Smokeless tobacco: Never Used  Substance and Sexual Activity  . Alcohol use: No  . Drug use: Yes    Types: Marijuana  . Sexual activity: Not Currently  Lifestyle  . Physical activity    Days per week: Not on file    Minutes per session: Not on file  . Stress: Not on file  Relationships  . Social Herbalist  on phone: Not on file    Gets together: Not on file    Attends religious service: Not on file    Active member of club or organization: Not on file    Attends meetings of clubs or organizations: Not on file    Relationship status: Not on file  Other Topics Concern  . Not on file  Social History Narrative  . Not on file   Additional Social History:                         Sleep: poor but in bed most of days Appetite:  Good  Current Medications: Current Facility-Administered Medications  Medication Dose Route Frequency Provider Last Rate Last Dose  . acetaminophen (TYLENOL) tablet 650 mg  650 mg Oral Q6H PRN Sharma Covert, MD      . alum & mag hydroxide-simeth (MAALOX/MYLANTA) 200-200-20 MG/5ML suspension 30 mL  30 mL Oral Q4H PRN Sharma Covert, MD      . amLODipine (NORVASC) tablet 5 mg  5 mg Oral Daily [name], Madie Reno, MD   5 mg at 01/24/19 0752  . cloZAPine (CLOZARIL) tablet 300 mg  300 mg Oral QHS [name], Madie Reno, MD   300 mg at 01/23/19 2114  . desmopressin (  DDAVP) tablet 0.1 mg  0.1 mg Oral QHS [name], [name], MD   0.1 mg at 01/23/19 2115  . divalproex (DEPAKOTE) DR tablet 500 mg  500 mg Oral Q12H Antonieta Pertlary, Greg Lawson, MD   500 mg at 01/24/19 0751  . fluvoxaMINE (LUVOX) tablet 25 mg  25 mg Oral QHS Antonieta Pertlary, Greg Lawson, MD   25 mg at 01/23/19 2113  . hydrOXYzine (ATARAX/VISTARIL) tablet 25 mg  25 mg Oral Q6H PRN Antonieta Pertlary, Greg Lawson, MD   25 mg at 01/15/19 2249  . magnesium hydroxide (MILK OF MAGNESIA) suspension 15 mL  15 mL Oral Daily PRN Antonieta Pertlary, Greg Lawson, MD      . nicotine (NICODERM CQ - dosed in mg/24 hr) patch 7 mg  7 mg Transdermal Daily [name], [name], MD   7 mg at 01/24/19 0751  . traZODone (DESYREL) tablet 150 mg  150 mg Oral QHS Malvin JohnsFarah, Mylasia Vorhees, MD   150 mg at 01/23/19 2113    Lab Results: No results found for this or any previous visit (from the past 48 hour(s)).  Blood Alcohol level:  Lab Results  Component Value Date   ETH <10 01/01/2019   ETH  <10 08/02/2018    Metabolic Disorder Labs: Lab Results  Component Value Date   HGBA1C 5.1 08/10/2018   MPG 99.67 08/10/2018   MPG 93.93 03/08/2018   No results found for: PROLACTIN Lab Results  Component Value Date   CHOL 175 08/10/2018   TRIG 103 08/10/2018   HDL 46 08/10/2018   CHOLHDL 3.8 08/10/2018   VLDL 21 08/10/2018   LDLCALC 108 (H) 08/10/2018   LDLCALC 168 (H) 03/08/2018    AIMS no discernable involuntary movements  Musculoskeletal: Strength & Muscle Tone: within normal limits Gait & Station: normal Patient leans: N/A  Psychiatric Specialty Exam: Physical Exam  ROS  Blood pressure 137/83, pulse (!) 107, temperature 97.9 F (36.6 C), temperature source Oral, resp. rate 18, height 6' (1.829 m), weight 94.3 kg, SpO2 99 %.Body mass index is 28.2 kg/m.  General Appearance: Casual  Eye Contact:  Good  Speech:  Clear and Coherent  Volume:  Decreased  Mood:  Euthymic  Affect:  Appropriate and Congruent  Thought Process:  Coherent and Descriptions of Associations: Circumstantial  Orientation:  Full (Time, Place, and Person)  Thought Content:  Logical  Suicidal Thoughts:  No  Homicidal Thoughts:  No  Memory:  Immediate;   Good  Judgement:  Good  Insight:  Good  Psychomotor Activity:  Normal  Concentration:  Concentration: Good  Recall:  Good  Fund of Knowledge:  Good  Language:  Good  Akathisia:  Negative  Handed:  Right  AIMS (if indicated):     Assets:  Physical Health Resilience Social Support  ADL's:  Intact  Cognition:  WNL  Sleep:  Number of Hours: 3.15     Treatment Plan Summary: Daily contact with patient to assess and evaluate symptoms and progress in treatment and Medication management continue clozapine for schizoaffective condition, continue to encourage compliance, continue Norvasc for hypertension, continue low-dose fluvoxamine for insomnia and negative symptomatology.  No change in precautions  Bibiana Gillean, MD 01/24/2019, 8:31 AM

## 2019-01-24 NOTE — Plan of Care (Signed)
Patient is stable and improving in prescribed therapeutic  regimen, encouraged to communicate concerns regarding his health, his appetite has increased and he is taking his medicines with out any side effects, denies any SI,HI AVH, encouraged to participate more in groups to improve his emotional  cognitive  state, also encourage patient to develop adequate coping skills to help socialization with peers, patient did not request any PRN's and only require 15 minutes safety checks.   Problem: Activity: Goal: Will verbalize the importance of balancing activity with adequate rest periods Outcome: Progressing   Problem: Education: Goal: Will be free of psychotic symptoms Outcome: Progressing Goal: Knowledge of the prescribed therapeutic regimen will improve Outcome: Progressing   Problem: Coping: Goal: Coping ability will improve Outcome: Progressing Goal: Will verbalize feelings Outcome: Progressing   Problem: Health Behavior/Discharge Planning: Goal: Compliance with prescribed medication regimen will improve Outcome: Progressing   Problem: Nutritional: Goal: Ability to achieve adequate nutritional intake will improve Outcome: Progressing   Problem: Role Relationship: Goal: Ability to communicate needs accurately will improve Outcome: Progressing Goal: Ability to interact with others will improve Outcome: Progressing   Problem: Safety: Goal: Ability to redirect hostility and anger into socially appropriate behaviors will improve Outcome: Progressing Goal: Ability to remain free from injury will improve Outcome: Progressing   Problem: Self-Care: Goal: Ability to participate in self-care as condition permits will improve Outcome: Progressing   Problem: Self-Concept: Goal: Will verbalize positive feelings about self Outcome: Progressing

## 2019-01-24 NOTE — Progress Notes (Signed)
[name] [name] has been visible in the milieu but had no interactions with peers. Eye contact is improved but patient continues to avoid long conversations with staff. He denies thoughts of self harm. Denies hallucinations. Compliant with medications. Patient is able to communicate his needs and concerns. Encouragements provided and safety maintained.

## 2019-01-25 MED ORDER — CLOZAPINE 25 MG PO TABS
250.0000 mg | ORAL_TABLET | Freq: Every day | ORAL | Status: DC
  Administered 2019-01-25 – 2019-01-26 (×2): 250 mg via ORAL
  Filled 2019-01-25 (×3): qty 2

## 2019-01-25 NOTE — BHH Counselor (Signed)
CSW had a conversation with Pamala Hurry at [place] confirmed that patient remained in the hospital and still in need of a bed.  Pamala Hurry confirmed patient remained on the wait list.   Assunta Curtis, MSW, LCSW 01/25/2019 8:52 AM

## 2019-01-25 NOTE — Progress Notes (Signed)
Recreation Therapy Notes  Date: 01/25/2019  Time: 9:30 am   Location: Craft room   Behavioral response: N/A   Intervention Topic: Stress  Discussion/Intervention: Patient did not attend group.   Clinical Observations/Feedback:  Patient did not attend group.   Auriah Hollings LRT/CTRS        Selim Durden 01/25/2019 10:23 AM

## 2019-01-25 NOTE — Plan of Care (Signed)
Cooperative with treatment, He spent most of the evening in bed resting quietly. He appears to resting at this time. Eyes closed, no issues to report on shift at this time.

## 2019-01-25 NOTE — Plan of Care (Signed)
Patient is alert and oriented to self, place and time. Compliant with medication and meals. Remains isolative to his room resting with his eyes closed. Denies hearing any voices, reports that his last BM was this morning and denies SI/HUI and pain. Will continue to monitor.

## 2019-01-25 NOTE — B[facility] Group Notes (Signed)
LCSW Group Therapy Note   01/25/2019 11:33 AM   Type of Therapy and Topic:  Group Therapy:  Overcoming Obstacles   Participation Level:  None   Description of Group:    In this group patients will be encouraged to explore what they see as obstacles to their own wellness and recovery. They will be guided to discuss their thoughts, feelings, and behaviors related to these obstacles. The group will process together ways to cope with barriers, with attention given to specific choices patients can make. Each patient will be challenged to identify changes they are motivated to make in order to overcome their obstacles. This group will be process-oriented, with patients participating in exploration of their own experiences as well as giving and receiving support and challenge from other group members.   Therapeutic Goals: 1. Patient will identify personal and current obstacles as they relate to admission. 2. Patient will identify barriers that currently interfere with their wellness or overcoming obstacles.  3. Patient will identify feelings, thought process and behaviors related to these barriers. 4. Patient will identify two changes they are willing to make to overcome these obstacles:      Summary of Patient Progress Pt came to group and left, then came back to group towards the end for a minute and left out again.     Therapeutic Modalities:   Cognitive Behavioral Therapy Solution Focused Therapy Motivational Interviewing Relapse Prevention Therapy  Evalina Field, MSW, LCSW Clinical Social Work 01/25/2019 11:33 AM

## 2019-01-25 NOTE — Progress Notes (Signed)
[facility]  01/25/2019 1:32 PM [name] [name]  MRN:  [id number] Subjective: Patient seen chart reviewed.  Patient was schizophrenia.  He still stays in bed much of the time.  Awake when I came to see him.  No complaints.  Denies any hallucinations.  Reviewed labs.  Clozapine level came back and was high.  Blood pressure still running slightly high. Principal Problem: Schizoaffective disorder, bipolar type (HCC) Diagnosis: Principal Problem:   Schizoaffective disorder, bipolar type (HCC) Active Problems:   Aggressive behavior of adolescent   Noncompliance   Hypertension   Elevated CPK  Total Time spent with patient: 30 minutes  Past Psychiatric History: History of schizophrenia with prominent negative symptoms getting gradually worse  Past Medical History:  Past Medical History:  Diagnosis Date  . Asthma   . Depression   . Psychosis [facility](HCC)     Past Surgical History:  Procedure Laterality Date  . BACK SURGERY     Family History: History reviewed. No pertinent family history. Family Psychiatric  History: See previous Social History:  Social History   Substance and Sexual Activity  Alcohol Use No     Social History   Substance and Sexual Activity  Drug Use Yes  . Types: Marijuana    Social History   Socioeconomic History  . Marital status: Single    Spouse name: Not on file  . Number of children: Not on file  . Years of education: Not on file  . Highest education level: Not on file  Occupational History  . Not on file  Social Needs  . Financial resource strain: Not on file  . Food insecurity    Worry: Not on file    Inability: Not on file  . Transportation needs    Medical: Not on file    Non-medical: Not on file  Tobacco Use  . Smoking status: Never Smoker  . Smokeless tobacco: Never Used  Substance and Sexual Activity  . Alcohol use: No  . Drug use: Yes    Types: Marijuana  . Sexual activity: Not Currently  Lifestyle  . Physical activity    Days per week: Not on file    Minutes per session: Not on file  . Stress: Not on file  Relationships  . Social Musicianconnections    Talks on phone: Not on file    Gets together: Not on file    Attends religious service: Not on file    Active member of club or organization: Not on file    Attends meetings of clubs or organizations: Not on file    Relationship status: Not on file  Other Topics Concern  . Not on file  Social History Narrative  . Not on file   Additional Social History:                         Sleep: Fair  Appetite:  Fair  Current Medications: Current Facility-Administered Medications  Medication Dose Route Frequency Provider Last Rate Last Dose  . acetaminophen (TYLENOL) tablet 650 mg  650 mg Oral Q6H PRN Antonieta Pertlary, Greg Lawson, MD      . alum & mag hydroxide-simeth (MAALOX/MYLANTA) 200-200-20 MG/5ML suspension 30 mL  30 mL Oral Q4H PRN Antonieta Pertlary, Greg Lawson, MD      . amLODipine (NORVASC) tablet 5 mg  5 mg Oral Daily Siraj Dermody, [name], MD   5 mg at 01/25/19 0811  . cloZAPine (CLOZARIL) tablet 250 mg  250 mg  Oral QHS Suhey Radford T, MD      . desmopressin (DDAVP) tablet 0.1 mg  0.1 mg Oral QHS Bintou Lafata T, MD   0.1 mg at 01/24/19 2116  . divalproex (DEPAKOTE) DR tablet 500 mg  500 mg Oral Q12H Antonieta Pertlary, Greg Lawson, MD   500 mg at 01/25/19 [id number]  . fluvoxaMINE (LUVOX) tablet 25 mg  25 mg Oral QHS Antonieta Pertlary, Greg Lawson, MD   25 mg at 01/24/19 2115  . hydrOXYzine (ATARAX/VISTARIL) tablet 25 mg  25 mg Oral Q6H PRN Antonieta Pertlary, Greg Lawson, MD   25 mg at 01/15/19 2249  . magnesium hydroxide (MILK OF MAGNESIA) suspension 15 mL  15 mL Oral Daily PRN Antonieta Pertlary, Greg Lawson, MD      . nicotine (NICODERM CQ - dosed in mg/24 hr) patch 7 mg  7 mg Transdermal Daily Joniyah Mallinger, [name], MD   7 mg at 01/24/19 0751  . traZODone (DESYREL) tablet 150 mg  150 mg Oral QHS Malvin JohnsFarah, Brian, MD   150 mg at 01/24/19 2115    Lab Results: No results found for this or any previous visit (from the past 48  hour(s)).  Blood Alcohol level:  Lab Results  Component Value Date   ETH <10 01/01/2019   ETH <10 08/02/2018    Metabolic Disorder Labs: Lab Results  Component Value Date   HGBA1C 5.1 08/10/2018   MPG 99.67 08/10/2018   MPG 93.93 03/08/2018   No results found for: PROLACTIN Lab Results  Component Value Date   CHOL 175 08/10/2018   TRIG 103 08/10/2018   HDL 46 08/10/2018   CHOLHDL 3.8 08/10/2018   VLDL 21 08/10/2018   LDLCALC 108 (H) 08/10/2018   LDLCALC 168 (H) 03/08/2018    Physical Findings: AIMS:  , ,  ,  ,    CIWA:    COWS:     Musculoskeletal: Strength & Muscle Tone: within normal limits Gait & Station: normal Patient leans: N/A  Psychiatric Specialty Exam: Physical Exam  Nursing note and vitals reviewed. Constitutional: He appears well-developed and well-nourished.  HENT:  Head: Normocephalic and atraumatic.  Eyes: Pupils are equal, round, and reactive to light. Conjunctivae are normal.  Neck: Normal range of motion.  Cardiovascular: Regular rhythm and normal heart sounds.  Respiratory: Effort normal.  GI: Soft.  Musculoskeletal: Normal range of motion.  Neurological: He is alert.  Skin: Skin is warm and dry.  Psychiatric: His affect is blunt. His speech is delayed. He is slowed. Cognition and memory are impaired. He expresses inappropriate judgment. He expresses no homicidal and no suicidal ideation.    Review of Systems  Constitutional: Negative.   HENT: Negative.   Eyes: Negative.   Respiratory: Negative.   Cardiovascular: Negative.   Gastrointestinal: Negative.   Musculoskeletal: Negative.   Skin: Negative.   Neurological: Negative.   Psychiatric/Behavioral: Negative for depression, hallucinations, memory loss, substance abuse and suicidal ideas. The patient is not nervous/anxious and does not have insomnia.     Blood pressure (!) 128/95, pulse 90, temperature 97.7 F (36.5 C), temperature source Oral, resp. rate 18, height 6' (1.829 m),  weight 94.3 kg, SpO2 100 %.Body mass index is 28.2 kg/m.  General Appearance: Casual  Eye Contact:  Fair  Speech:  Slow  Volume:  Decreased  Mood:  Euthymic  Affect:  Flat  Thought Process:  Coherent  Orientation:  Full (Time, Place, and Person)  Thought Content:  Rumination  Suicidal Thoughts:  No  Homicidal Thoughts:  No  Memory:  Immediate;   Fair Recent;   Fair Remote;   Fair  Judgement:  Poor  Insight:  Shallow  Psychomotor Activity:  Decreased  Concentration:  Concentration: Fair  Recall:  [employer] of Knowledge:  Fair  Language:  Fair  Akathisia:  No  Handed:  Right  AIMS (if indicated):     Assets:  Housing Physical Health  ADL's:  Impaired  Cognition:  Impaired,  Mild  Sleep:  Number of Hours: 8.5     Treatment Plan Summary: Daily contact with patient to assess and evaluate symptoms and progress in treatment, Medication management and Plan Young man who appears to have schizophrenia of the type very dominated by negative symptoms.  He has gotten full dose of long-acting in [place] load and also has a therapeutic clozapine level.  In fact the clozapine level is so high I am going to cut back on that to 250 mg at night to minimize sedation and side effects.  Patient seems pretty stable with his behavior.  Reviewed with treatment team.  I think we are probably going to aim for getting him discharged as there seems little hope for a referral to [facility] given his behavior.  Alethia Berthold, MD 01/25/2019, 1:32 PM

## 2019-01-26 LAB — CBC WITH DIFFERENTIAL/PLATELET
Abs Immature Granulocytes: 0.06 10*3/uL (ref 0.00–0.07)
Basophils Absolute: 0 10*3/uL (ref 0.0–0.1)
Basophils Relative: 0 %
Eosinophils Absolute: 0 10*3/uL (ref 0.0–0.5)
Eosinophils Relative: 0 %
HCT: 44.1 % (ref 39.0–52.0)
Hemoglobin: 14.8 g/dL (ref 13.0–17.0)
Immature Granulocytes: 1 %
Lymphocytes Relative: 36 %
Lymphs Abs: 1.6 10*3/uL (ref 0.7–4.0)
MCH: 28.1 pg (ref 26.0–34.0)
MCHC: 33.6 g/dL (ref 30.0–36.0)
MCV: 83.7 fL (ref 80.0–100.0)
Monocytes Absolute: 0.6 10*3/uL (ref 0.1–1.0)
Monocytes Relative: 13 %
Neutro Abs: 2.2 10*3/uL (ref 1.7–7.7)
Neutrophils Relative %: 50 %
Platelets: 270 10*3/uL (ref 150–400)
RBC: 5.27 MIL/uL (ref 4.22–5.81)
RDW: 12.6 % (ref 11.5–15.5)
WBC: 4.4 10*3/uL (ref 4.0–10.5)
nRBC: 0 % (ref 0.0–0.2)

## 2019-01-26 NOTE — [facility] Group Notes (Signed)
Feelings Around Diagnosis 01/26/2019 1PM  Type of Therapy/Topic:  Group Therapy:  Feelings about Diagnosis  Participation Level:  Did Not Attend   Description of Group:   This group will allow patients to explore their thoughts and feelings about diagnoses they have received. Patients will be guided to explore their level of understanding and acceptance of these diagnoses. Facilitator will encourage patients to process their thoughts and feelings about the reactions of others to their diagnosis and will guide patients in identifying ways to discuss their diagnosis with significant others in their lives. This group will be process-oriented, with patients participating in exploration of their own experiences, giving and receiving support, and processing challenge from other group members.   Therapeutic Goals: 1. Patient will demonstrate understanding of diagnosis as evidenced by identifying two or more symptoms of the disorder 2. Patient will be able to express two feelings regarding the diagnosis 3. Patient will demonstrate their ability to communicate their needs through discussion and/or role play  Summary of Patient Progress:       Therapeutic Modalities:   Cognitive Behavioral Therapy Brief Therapy Feelings Identification    Aeon Koors T Andros Channing, LCSW 01/26/2019 1:53 PM  

## 2019-01-26 NOTE — Plan of Care (Signed)
  Problem: Activity: Goal: Will verbalize the importance of balancing activity with adequate rest periods Outcome: Progressing  Patient verbalized importance of adequate activity and rest.

## 2019-01-26 NOTE — Progress Notes (Signed)
Recreation Therapy Notes   Date: 01/26/2019  Time: 9:30 am   Location: Craft room   Behavioral response: N/A   Intervention Topic: Goals  Discussion/Intervention: Patient did not attend group.   Clinical Observations/Feedback:  Patient did not attend group.   Chyane Greer LRT/CTRS        Arash Karstens 01/26/2019 10:54 AM

## 2019-01-26 NOTE — Progress Notes (Signed)
Patient alert and oriented x 4, affect is bright  no distress noted, thoughts are organized and coherent , he appears less anxious , interacting with peers and staff appropriately he denies SI/HI/AVH complaint with medication regimen no distress noted, he was offered emotional support and encouragement to attend evening wrap up group.  Patient didn't attend evening wrap up. 15 minutes safety checks maintained.

## 2019-01-26 NOTE — Progress Notes (Signed)
D: Patient stated slept good last night .Stated appetite is good and energy level  Is normal. Stated concentration is good . Stated on Depression scale 0, hopeless 0 and anxiety 0 .( low 0-10 high) Denies suicidal  homicidal ideations  . Denies auditory hallucinations  No pain concerns . Appropriate ADL'S. Interacting with peers and staff. Encourage patient to take a bath. Verbalize understanding of medication received .Patientis notattending unit programing , continue to work onEmotional and mental status.Patient mprovement / positive change noted . Continue to work on coping , decision making and anxiety concerns.  . Patient able to verbalize understanding with information received . No anger management or safety concerns noted this shift  Goal " Stay Positive " Patient aware of discharge tomorrow .  A: Encourage patient participation with unit programming . Instruction  Given on  Medication , verbalize understanding.  R: Voice no other concerns. Staff continue to monitor

## 2019-01-26 NOTE — Plan of Care (Signed)
Encourage patient to take a bath. Verbalize  understanding  of medication received .Patient is not  attending unit programing , continue to work on Emotional and mental status  .  Patient mprovement / positive change noted . Continue to work on  coping , decision making and anxiety  concerns. Denies suicidal ideations   . Patient  able  to verbalize understanding  with information received . No anger  management  or safety concerns  noted this shift   Problem: Activity: Goal: Will verbalize the importance of balancing activity with adequate rest periods Outcome: Progressing   Problem: Education: Goal: Will be free of psychotic symptoms Outcome: Progressing Goal: Knowledge of the prescribed therapeutic regimen will improve Outcome: Progressing   Problem: Coping: Goal: Coping ability will improve Outcome: Progressing Goal: Will verbalize feelings Outcome: Progressing   Problem: Health Behavior/Discharge Planning: Goal: Compliance with prescribed medication regimen will improve Outcome: Progressing   Problem: Health Behavior/Discharge Planning: Goal: Compliance with prescribed medication regimen will improve Outcome: Progressing   Problem: Nutritional: Goal: Ability to achieve adequate nutritional intake will improve Outcome: Progressing   Problem: Role Relationship: Goal: Ability to communicate needs accurately will improve Outcome: Progressing Goal: Ability to interact with others will improve Outcome: Progressing   Problem: Safety: Goal: Ability to redirect hostility and anger into socially appropriate behaviors will improve Outcome: Progressing Goal: Ability to remain free from injury will improve Outcome: Progressing   Problem: Self-Care: Goal: Ability to participate in self-care as condition permits will improve Outcome: Progressing   Problem: Self-Concept: Goal: Will verbalize positive feelings about self Outcome: Progressing

## 2019-01-26 NOTE — Progress Notes (Signed)
Clozapine monitoring Consult   23 yo male ordered clozapine 250 mg QHS  7/7 ANC 2200  Information entered into Clozapine registry and pt eligible to receive clozapine Next labs due in a week - ordered for 02/02/19 Pharmacy will continue to follow.   Lu Duffel, PharmD, BCPS Clinical Pharmacist 01/26/2019 8:08 AM

## 2019-01-26 NOTE — Progress Notes (Signed)
[facility]  01/26/2019 10:30 AM [name] [name]  MRN:  [id number]   Subjective:  Patient's chart and findings reviewed and discussed with treatment team. Patient denies any suicidal or homicidal ideations and denies any hallucinations. The patient states that he does not have any concerns or questions. He feels that he is ready to discharge home and states he has been attending groups now. However, documentation states he has not been attending. He reports having a good appetite and good sleep. Patient denies any medication side effects and states that the lowered dose of Clozaril is not as sedating.   Principal Problem: Schizoaffective disorder, bipolar type (HCC) Diagnosis: Principal Problem:   Schizoaffective disorder, bipolar type (HCC) Active Problems:   Aggressive behavior of adolescent   Noncompliance   Hypertension   Elevated CPK  Total Time spent with patient: 20 minutes  Past Psychiatric History: History of schizophrenia with prominent negative symptoms getting gradually worse  Past Medical History:  Past Medical History:  Diagnosis Date  . Asthma   . Depression   . Psychosis [facility](HCC)     Past Surgical History:  Procedure Laterality Date  . BACK SURGERY     Family History: History reviewed. No pertinent family history. Family Psychiatric  History: see previous Social History:  Social History   Substance and Sexual Activity  Alcohol Use No     Social History   Substance and Sexual Activity  Drug Use Yes  . Types: Marijuana    Social History   Socioeconomic History  . Marital status: Single    Spouse name: Not on file  . Number of children: Not on file  . Years of education: Not on file  . Highest education level: Not on file  Occupational History  . Not on file  Social Needs  . Financial resource strain: Not on file  . Food insecurity    Worry: Not on file    Inability: Not on file  . Transportation needs    Medical: Not on file     Non-medical: Not on file  Tobacco Use  . Smoking status: Never Smoker  . Smokeless tobacco: Never Used  Substance and Sexual Activity  . Alcohol use: No  . Drug use: Yes    Types: Marijuana  . Sexual activity: Not Currently  Lifestyle  . Physical activity    Days per week: Not on file    Minutes per session: Not on file  . Stress: Not on file  Relationships  . Social Musicianconnections    Talks on phone: Not on file    Gets together: Not on file    Attends religious service: Not on file    Active member of club or organization: Not on file    Attends meetings of clubs or organizations: Not on file    Relationship status: Not on file  Other Topics Concern  . Not on file  Social History Narrative  . Not on file   Additional Social History:                         Sleep: Good  Appetite:  Good  Current Medications: Current Facility-Administered Medications  Medication Dose Route Frequency Provider Last Rate Last Dose  . acetaminophen (TYLENOL) tablet 650 mg  650 mg Oral Q6H PRN Antonieta Pertlary, Greg Lawson, MD      . alum & mag hydroxide-simeth (MAALOX/MYLANTA) 200-200-20 MG/5ML suspension 30 mL  30 mL Oral Q4H PRN Clary,  Marlane MingleGreg Lawson, MD      . amLODipine (NORVASC) tablet 5 mg  5 mg Oral Daily [name], [name], MD   5 mg at 01/26/19 0747  . cloZAPine (CLOZARIL) tablet 250 mg  250 mg Oral QHS [name], [name], MD   250 mg at 01/25/19 2209  . desmopressin (DDAVP) tablet 0.1 mg  0.1 mg Oral QHS [name], [name], MD   0.1 mg at 01/25/19 2212  . divalproex (DEPAKOTE) DR tablet 500 mg  500 mg Oral Q12H Antonieta Pertlary, Greg Lawson, MD   500 mg at 01/26/19 0747  . fluvoxaMINE (LUVOX) tablet 25 mg  25 mg Oral QHS Antonieta Pertlary, Greg Lawson, MD   25 mg at 01/25/19 2209  . hydrOXYzine (ATARAX/VISTARIL) tablet 25 mg  25 mg Oral Q6H PRN Antonieta Pertlary, Greg Lawson, MD   25 mg at 01/15/19 2249  . magnesium hydroxide (MILK OF MAGNESIA) suspension 15 mL  15 mL Oral Daily PRN Antonieta Pertlary, Greg Lawson, MD      . nicotine (NICODERM CQ  - dosed in mg/24 hr) patch 7 mg  7 mg Transdermal Daily [name], [name], MD   7 mg at 01/24/19 0751  . traZODone (DESYREL) tablet 150 mg  150 mg Oral QHS Malvin JohnsFarah, Brian, MD   150 mg at 01/25/19 2209    Lab Results:  Results for orders placed or performed during the hospital encounter of 01/02/19 (from the past 48 hour(s))  CBC with Differential/Platelet     Status: None   Collection Time: 01/26/19  7:38 AM  Result Value Ref Range   WBC 4.4 4.0 - 10.5 K/uL   RBC 5.27 4.22 - 5.81 MIL/uL   Hemoglobin 14.8 13.0 - 17.0 g/dL   HCT 28.444.1 13.239.0 - 44.052.0 %   MCV 83.7 80.0 - 100.0 fL   MCH 28.1 26.0 - 34.0 pg   MCHC 33.6 30.0 - 36.0 g/dL   RDW 10.212.6 72.511.5 - 36.615.5 %   Platelets 270 150 - 400 K/uL   nRBC 0.0 0.0 - 0.2 %   Neutrophils Relative % 50 %   Neutro Abs 2.2 1.7 - 7.7 K/uL   Lymphocytes Relative 36 %   Lymphs Abs 1.6 0.7 - 4.0 K/uL   Monocytes Relative 13 %   Monocytes Absolute 0.6 0.1 - 1.0 K/uL   Eosinophils Relative 0 %   Eosinophils Absolute 0.0 0.0 - 0.5 K/uL   Basophils Relative 0 %   Basophils Absolute 0.0 0.0 - 0.1 K/uL   Immature Granulocytes 1 %   Abs Immature Granulocytes 0.06 0.00 - 0.07 K/uL    Comment: Performed at [facility], [address] [place], [place] [address]    Blood Alcohol level:  Lab Results  Component Value Date   [facility]   ETH <10 08/02/2018    Metabolic Disorder Labs: Lab Results  Component Value Date   HGBA1C 5.1 08/10/2018   MPG 99.67 08/10/2018   MPG 93.93 03/08/2018   No results found for: PROLACTIN Lab Results  Component Value Date   CHOL 175 08/10/2018   TRIG 103 08/10/2018   HDL 46 08/10/2018   CHOLHDL 3.8 08/10/2018   VLDL 21 08/10/2018   LDLCALC 108 (H) 08/10/2018   LDLCALC 168 (H) 03/08/2018    Physical Findings: AIMS:  , ,  ,  ,    CIWA:    COWS:     Musculoskeletal: Strength & Muscle Tone: within normal limits Gait & Station: normal Patient leans: N/A  Psychiatric Specialty  Exam: Physical Exam   Nursing note and vitals reviewed. Constitutional: He is oriented to person, place, and time. He appears well-developed and well-nourished.  Cardiovascular: Normal rate.  Respiratory: Effort normal.  Musculoskeletal: Normal range of motion.  Neurological: He is alert and oriented to person, place, and time.  Skin: Skin is warm.  Psychiatric: He has a normal mood and affect. Thought content normal. He is slowed. Cognition and memory are impaired. He expresses inappropriate judgment.    Review of Systems  Constitutional: Negative.   HENT: Negative.   Eyes: Negative.   Respiratory: Negative.   Cardiovascular: Negative.   Gastrointestinal: Negative.   Genitourinary: Negative.   Musculoskeletal: Negative.   Skin: Negative.   Neurological: Negative.   Endo/Heme/Allergies: Negative.   Psychiatric/Behavioral: Negative.  Negative for hallucinations and suicidal ideas. The patient is not nervous/anxious.     Blood pressure 134/82, pulse 94, temperature 98.1 F (36.7 C), temperature source Oral, resp. rate 18, height 6' (1.829 m), weight 94.3 kg, SpO2 100 %.Body mass index is 28.2 kg/m.  General Appearance: Casual  Eye Contact:  Fair  Speech:  Clear and Coherent  Volume:  Decreased  Mood:  Euthymic  Affect:  Flat  Thought Process:  Coherent and Descriptions of Associations: Intact  Orientation:  Full (Time, Place, and Person)  Thought Content:  WDL  Suicidal Thoughts:  No  Homicidal Thoughts:  No  Memory:  Immediate;   Good Recent;   Fair Remote;   Fair  Judgement:  Fair  Insight:  Shallow  Psychomotor Activity:  Normal  Concentration:  Concentration: Fair and Attention Span: Fair  Recall:  Good  Fund of Knowledge:  Fair  Language:  Good  Akathisia:  No  Handed:  Right  AIMS (if indicated):     Assets:  Desire for Improvement Financial Resources/Insurance Housing Resilience Social Support Transportation  ADL's:  Intact  Cognition:  Impaired,  Mild  Sleep:  Number of  Hours: 7.5     Treatment Plan Summary: Daily contact with patient to assess and evaluate symptoms and progress in treatment and Medication management. Patient has been isolating to his room. Patient has not been attending groups per chart review. Patient's labs are reviewed and all are WNL. The patient has a flat affect and remains in the bed when he is approached and doesn't sit up.The discussion was for the patient to be discharged tomorrow. Plan is continue Clozaril 250 mg PO QHS, Depakote DR 500 mg Q12H, luvox 25 mg QHS, Trazodone 150 mg O QHS, and Vistrail 25 mg Q6H PRN to manage Schizoaffective disorder and sleep.   [place], FNP 01/26/2019, 10:30 AM

## 2019-01-27 MED ORDER — TRAZODONE HCL 150 MG PO TABS: 150.0000 mg | ORAL_TABLET | Freq: Every day | ORAL | 1 refills | Status: DC

## 2019-01-27 MED ORDER — FLUVOXAMINE MALEATE 25 MG PO TABS: 25.0000 mg | ORAL_TABLET | Freq: Every day | ORAL | 1 refills | Status: DC

## 2019-01-27 MED ORDER — PALIPERIDONE PALMITATE ER 156 MG/ML IM SUSY
156.0000 mg | PREFILLED_SYRINGE | INTRAMUSCULAR | Status: DC
  Filled 2019-01-27: qty 1

## 2019-01-27 MED ORDER — AMLODIPINE BESYLATE 5 MG PO TABS: 5.0000 mg | ORAL_TABLET | Freq: Every day | ORAL | 1 refills | Status: DC

## 2019-01-27 MED ORDER — DIVALPROEX SODIUM 500 MG PO DR TAB: 500.0000 mg | DELAYED_RELEASE_TABLET | Freq: Two times a day (BID) | ORAL | 1 refills | Status: DC

## 2019-01-27 MED ORDER — CLOZAPINE 100 MG PO TABS: 250.0000 mg | ORAL_TABLET | Freq: Every day | ORAL | 1 refills | Status: DC

## 2019-01-27 MED ORDER — DESMOPRESSIN ACETATE 0.1 MG PO TABS: 0.1000 mg | ORAL_TABLET | Freq: Every day | ORAL | 1 refills | Status: DC

## 2019-01-27 MED ORDER — PALIPERIDONE PALMITATE ER 156 MG/ML IM SUSY: 156.0000 mg | PREFILLED_SYRINGE | INTRAMUSCULAR | 1 refills | Status: DC

## 2019-01-27 NOTE — Progress Notes (Signed)
Patient alert and oriented x 4, affect isflat but he brightens upon approach, he was  calm, and appropriate with staff no distress noted.  Patient's thoughts are rganized and coherent he appears less anxious, heinteracted with peers and staff  briefly, he was visible;e in the milieu and he denies SI/HI/AVH.  Patient is complaint with medication regimen, he was offered emotional support and encouragement to attend evening wrap up group. Patient didn't attend evening wrap up. Patient expressed that he was elated that he is going home tomorrow, 15 minutes safety checks maintained will continue to monitor.

## 2019-01-27 NOTE — Progress Notes (Signed)
Recreation Therapy Notes  INPATIENT RECREATION TR PLAN  Patient Details Name: [name] [name] MRN: [id number] DOB: [birth year] Today's Date: 01/27/2019  Rec Therapy Plan Is patient appropriate for Therapeutic Recreation?: Yes Treatment times per week: at least 3 Estimated Length of Stay: 5-7 days TR Treatment/Interventions: Group participation (Comment)  Discharge Criteria Pt will be discharged from therapy if:: Discharged Treatment plan/goals/alternatives discussed and agreed upon by:: Patient/family  Discharge Summary Short term goals set: Patient will engage in groups without prompting or encouragement from LRT x3 group sessions within 5 recreation therapy group sessions Short term goals met: Not met Progress toward goals comments: Groups attended Which groups?: Self-esteem Reason goals not met: Patient spent most of his time in his room Therapeutic equipment acquired: N/A Reason patient discharged from therapy: Discharge from hospital Pt/family agrees with progress & goals achieved: Yes Date patient discharged from therapy: 01/27/19   Corean Yoshimura 01/27/2019, 1:01 PM

## 2019-01-27 NOTE — Plan of Care (Signed)
  Problem: Activity: Goal: Will verbalize the importance of balancing activity with adequate rest periods Outcome: Adequate for Discharge   Problem: Coping: Goal: Coping ability will improve Outcome: Adequate for Discharge Goal: Will verbalize feelings Outcome: Adequate for Discharge   Problem: Health Behavior/Discharge Planning: Goal: Compliance with prescribed medication regimen will improve Outcome: Adequate for Discharge   Problem: Education: Goal: Will be free of psychotic symptoms Outcome: Adequate for Discharge Goal: Knowledge of the prescribed therapeutic regimen will improve Outcome: Adequate for Discharge   Problem: Nutritional: Goal: Ability to achieve adequate nutritional intake will improve Outcome: Adequate for Discharge   Problem: Role Relationship: Goal: Ability to communicate needs accurately will improve Outcome: Adequate for Discharge Goal: Ability to interact with others will improve Outcome: Adequate for Discharge   Problem: Safety: Goal: Ability to redirect hostility and anger into socially appropriate behaviors will improve Outcome: Adequate for Discharge Goal: Ability to remain free from injury will improve Outcome: Adequate for Discharge   Problem: Self-Care: Goal: Ability to participate in self-care as condition permits will improve Outcome: Adequate for Discharge   Problem: Self-Concept: Goal: Will verbalize positive feelings about self Outcome: Adequate for Discharge

## 2019-01-27 NOTE — Tx Team (Signed)
Interdisciplinary Treatment and Diagnostic Plan Update  01/27/2019 Time of Session: 8:30 AM [name] [name] MRN: [id number]  Principal Diagnosis: Schizoaffective disorder, bipolar type (HCC)  Secondary Diagnoses: Principal Problem:   Schizoaffective disorder, bipolar type (HCC) Active Problems:   Aggressive behavior of adolescent   Noncompliance   Hypertension   Elevated CPK   Current Medications:  Current Facility-Administered Medications  Medication Dose Route Frequency Provider Last Rate Last Dose  . acetaminophen (TYLENOL) tablet 650 mg  650 mg Oral Q6H PRN Antonieta Pertlary, Greg Lawson, MD      . alum & mag hydroxide-simeth (MAALOX/MYLANTA) 200-200-20 MG/5ML suspension 30 mL  30 mL Oral Q4H PRN Antonieta Pertlary, Greg Lawson, MD      . amLODipine (NORVASC) tablet 5 mg  5 mg Oral Daily [name], [name], MD   5 mg at 01/27/19 0813  . cloZAPine (CLOZARIL) tablet 250 mg  250 mg Oral QHS [name], [name], MD   250 mg at 01/26/19 2225  . desmopressin (DDAVP) tablet 0.1 mg  0.1 mg Oral QHS [name], [name], MD   0.1 mg at 01/26/19 2200  . divalproex (DEPAKOTE) DR tablet 500 mg  500 mg Oral Q12H Antonieta Pertlary, Greg Lawson, MD   500 mg at 01/27/19 0813  . fluvoxaMINE (LUVOX) tablet 25 mg  25 mg Oral QHS Antonieta Pertlary, Greg Lawson, MD   25 mg at 01/26/19 2225  . hydrOXYzine (ATARAX/VISTARIL) tablet 25 mg  25 mg Oral Q6H PRN Antonieta Pertlary, Greg Lawson, MD   25 mg at 01/15/19 2249  . magnesium hydroxide (MILK OF MAGNESIA) suspension 15 mL  15 mL Oral Daily PRN Antonieta Pertlary, Greg Lawson, MD      . nicotine (NICODERM CQ - dosed in mg/24 hr) patch 7 mg  7 mg Transdermal Daily [name], [name], MD   7 mg at 01/24/19 0751  . traZODone (DESYREL) tablet 150 mg  150 mg Oral QHS Malvin JohnsFarah, Brian, MD   150 mg at 01/26/19 2225   PTA Medications: Medications Prior to Admission  Medication Sig Dispense Refill Last Dose  . benztropine (COGENTIN) 0.5 MG tablet Take 0.5 mg by mouth 2 (two) times daily.   Past Month at Unknown time  . cloZAPine (CLOZARIL) 100 MG  tablet Take 3 tablets (300 mg total) by mouth at bedtime. 90 tablet 1` Past Month at Unknown time  . divalproex (DEPAKOTE) 500 MG DR tablet Take 1 tablet (500 mg total) by mouth every 12 (twelve) hours. 60 tablet 1 Past Month at Unknown time  . fluvoxaMINE (LUVOX) 25 MG tablet Take 1 tablet (25 mg total) by mouth at bedtime. 30 tablet 1 Past Month at Unknown time  . metFORMIN (GLUCOPHAGE) 500 MG tablet Take 1 tablet (500 mg total) by mouth daily with breakfast. 30 tablet 1 Past Month at Unknown time  . metoprolol tartrate (LOPRESSOR) 25 MG tablet Take 1 tablet (25 mg total) by mouth 2 (two) times daily. 60 tablet 1 Past Month at Unknown time  . traZODone (DESYREL) 100 MG tablet Take 100 mg by mouth at bedtime.   prn at prn    Patient Stressors: Financial difficulties Medication change or noncompliance  Patient Strengths: Motivation for treatment/growth Supportive family/friends  Treatment Modalities: Medication Management, Group therapy, Case management,  1 to 1 session with clinician, Psychoeducation, Recreational therapy.   Physician Treatment Plan for Primary Diagnosis: Schizoaffective disorder, bipolar type (HCC) Long Term Goal(s): Improvement in symptoms so as ready for discharge Improvement in symptoms so as ready for discharge   Short Term  Goals: Ability to identify changes in lifestyle to reduce recurrence of condition will improve Ability to verbalize feelings will improve Ability to demonstrate self-control will improve Ability to identify and develop effective coping behaviors will improve Ability to maintain clinical measurements within normal limits will improve Compliance with prescribed medications will improve Ability to identify changes in lifestyle to reduce recurrence of condition will improve Ability to verbalize feelings will improve Ability to demonstrate self-control will improve Ability to identify and develop effective coping behaviors will improve Ability to  maintain clinical measurements within normal limits will improve Compliance with prescribed medications will improve  Medication Management: Evaluate patient's response, side effects, and tolerance of medication regimen.  Therapeutic Interventions: 1 to 1 sessions, Unit Group sessions and Medication administration.  Evaluation of Outcomes: Not Progressing   Physician Treatment Plan for Secondary Diagnosis: Principal Problem:   Schizoaffective disorder, bipolar type ([place]) Active Problems:   Aggressive behavior of adolescent   Noncompliance   Hypertension   Elevated CPK  Long Term Goal(s): Improvement in symptoms so as ready for discharge Improvement in symptoms so as ready for discharge   Short Term Goals: Ability to identify changes in lifestyle to reduce recurrence of condition will improve Ability to verbalize feelings will improve Ability to demonstrate self-control will improve Ability to identify and develop effective coping behaviors will improve Ability to maintain clinical measurements within normal limits will improve Compliance with prescribed medications will improve Ability to identify changes in lifestyle to reduce recurrence of condition will improve Ability to verbalize feelings will improve Ability to demonstrate self-control will improve Ability to identify and develop effective coping behaviors will improve Ability to maintain clinical measurements within normal limits will improve Compliance with prescribed medications will improve     Medication Management: Evaluate patient's response, side effects, and tolerance of medication regimen.  Therapeutic Interventions: 1 to 1 sessions, Unit Group sessions and Medication administration.  Evaluation of Outcomes: Adequate for Discharge   RN Treatment Plan for Primary Diagnosis: Schizoaffective disorder, bipolar type ([place]) Long Term Goal(s): Knowledge of disease and therapeutic regimen to maintain health will  improve  Short Term Goals: Ability to participate in decision making will improve, Ability to verbalize feelings will improve, Ability to identify and develop effective coping behaviors will improve and Compliance with prescribed medications will improve  Medication Management: RN will administer medications as ordered by provider, will assess and evaluate patient's response and provide education to patient for prescribed medication. RN will report any adverse and/or side effects to prescribing provider.  Therapeutic Interventions: 1 on 1 counseling sessions, Psychoeducation, Medication administration, Evaluate responses to treatment, Monitor vital signs and CBGs as ordered, Perform/monitor CIWA, COWS, AIMS and Fall Risk screenings as ordered, Perform wound care treatments as ordered.  Evaluation of Outcomes: Adequate for Discharge   LCSW Treatment Plan for Primary Diagnosis: Schizoaffective disorder, bipolar type ([place]) Long Term Goal(s): Safe transition to appropriate next level of care at discharge, Engage patient in therapeutic group addressing interpersonal concerns.  Short Term Goals: Engage patient in aftercare planning with referrals and resources, Increase emotional regulation, Facilitate acceptance of mental health diagnosis and concerns and Increase skills for wellness and recovery  Therapeutic Interventions: Assess for all discharge needs, 1 to 1 time with Social worker, Explore available resources and support systems, Assess for adequacy in community support network, Educate family and significant other(s) on suicide prevention, Complete Psychosocial Assessment, Interpersonal group therapy.  Evaluation of Outcomes: Adequate for Discharge   Progress in Treatment: Attending groups: No.  Participating in groups: No. Taking medication as prescribed: Yes. Toleration medication: Yes. Family/Significant other contact made: No, will contact:  pt declined collateral contact Patient  understands diagnosis: Yes. Discussing patient identified problems/goals with staff: Yes. Medical problems stabilized or resolved: Yes. Denies suicidal/homicidal ideation: Yes. Issues/concerns per patient self-inventory: No. Other: N/A  New problem(s) identified: No, Describe:  none  New Short Term/Long Term Goal(s): medication management for mood stabilization; development of comprehensive mental wellness/sobriety plan.  Patient Goals:  "to bet better, where to go from this, and not have to come back again"  Discharge Plan or Barriers: SPE pamphlet, Mobile Crisis information, and AA/NA information provided to patient for additional community support and resources. Pt is agreeable to follow up with his current [facility] team. Update 01/08/2019:  Per reports in progression, patient has begun to decline to take his medications.  Pt remains isolative and does not attend group or the milieu of the unit. 01/13/19-pt has received forced invega shot. Minimal progression at this time, pt still isolates to his room. [facility] UPDATE 01/18/19 - [facility], pt is currently on the wait list. Update 01/22/2019:  [facility]  CSW has sent in supporting documents.  Patient has begun to come out of his room and is seen more on the unit. UPDATE 01/27/19: Pt has been compliant and is scheduled to be discharged. Pt will continue to follow up with [facility].   Reason for Continuation of Hospitalization: None   Estimated Length of Stay: Today 01/27/19  Recreational Therapy: Patient Stressors: N/A  Patient Goal: Patient will engage in groups without prompting or encouragement from LRT x3 group sessions within 5 recreation therapy group sessions  Attendees: Patient: Carlyle LipaMohmed [name] 01/27/2019 9:34 AM  Physician:  01/27/2019 9:34 AM  Nursing:  01/27/2019 9:34 AM  RN Care Manager: 01/27/2019 9:34 AM  Social Worker: Zollie Scalelivia Ota Ebersole LCSW 01/27/2019 9:34 AM   Recreational Therapist:  01/27/2019 9:34 AM  Other:  01/27/2019 9:34 AM  Other:  01/27/2019 9:34 AM  Other: 01/27/2019 9:34 AM    Scribe for Treatment Team: Charlann Langelivia K Maleya Leever, LCSW 01/27/2019 9:34 AM

## 2019-01-27 NOTE — Progress Notes (Signed)
Recreation Therapy Notes    Date: 01/27/2019  Time: 9:30 am   Location: Craft room   Behavioral response: N/A   Intervention Topic: Coping Skills  Discussion/Intervention: Patient did not attend group.   Clinical Observations/Feedback:  Patient did not attend group.   [name] [name] LRT/CTRS        [name] 01/27/2019 11:40 AM

## 2019-01-27 NOTE — [facility] Suicide Risk Assessment (Signed)
[facility] Discharge Suicide Risk Assessment   Principal Problem: Schizoaffective disorder, bipolar type [facility]) Discharge Diagnoses: Principal Problem:   Schizoaffective disorder, bipolar type ([place]) Active Problems:   Aggressive behavior of adolescent   Noncompliance   Hypertension   Elevated CPK   Total Time spent with patient: 45 minutes  Musculoskeletal: Strength & Muscle Tone: within normal limits Gait & Station: normal Patient leans: N/A  Psychiatric Specialty Exam: Review of Systems  Constitutional: Negative.   HENT: Negative.   Eyes: Negative.   Respiratory: Negative.   Cardiovascular: Negative.   Gastrointestinal: Negative.   Musculoskeletal: Negative.   Skin: Negative.   Neurological: Negative.   Psychiatric/Behavioral: Negative for depression, hallucinations, memory loss, substance abuse and suicidal ideas. The patient is not nervous/anxious and does not have insomnia.     Blood pressure (!) 126/94, pulse 99, temperature 97.6 F (36.4 C), temperature source Oral, resp. rate 18, height 6' (1.829 m), weight 94.3 kg, SpO2 100 %.Body mass index is 28.2 kg/m.  General Appearance: Casual  Eye Contact::  Fair  Speech:  Slow409  Volume:  Decreased  Mood:  Euthymic  Affect:  Constricted  Thought Process:  Coherent  Orientation:  Full (Time, Place, and Person)  Thought Content:  Tangential  Suicidal Thoughts:  No  Homicidal Thoughts:  No  Memory:  Immediate;   Fair Recent;   Fair Remote;   Fair  Judgement:  Fair  Insight:  Shallow  Psychomotor Activity:  Decreased  Concentration:  Poor  Recall:  [employer] of Knowledge:Fair  Language: Fair  Akathisia:  No  Handed:  Right  AIMS (if indicated):     Assets:  Desire for Improvement Financial Resources/Insurance Housing Physical Health Resilience Social Support  Sleep:  Number of Hours: 7.15  Cognition: Impaired,  Mild  ADL's:  Impaired   Mental Status Per Nursing Assessment::   On Admission:   NA  Demographic Factors:  Male, Adolescent or young adult and Unemployed  Loss Factors: Significant and rapid worsening of illness  Historical Factors: NA  Risk Reduction Factors:   Living with another person, especially a relative, Positive social support and Positive therapeutic relationship  Continued Clinical Symptoms:  Schizophrenia:   Less than 54 years old  Cognitive Features That Contribute To Risk:  Thought constriction (tunnel vision)    Suicide Risk:  Minimal: No identifiable suicidal ideation.  Patients presenting with no risk factors but with morbid ruminations; may be classified as minimal risk based on the severity of the depressive symptoms  Follow-up [facility]. Follow up.   Why: Please continue to follow up with you ACTT team as usual. Thank you! Contact information: [place] [address] [phone]        [facility] .   Specialty: [place] information: [address] [place] [phone]          Plan Of Care/Follow-up recommendations:  Activity:  Activity as tolerated Diet:  Regular diet Other:  Patient has been counseled repeatedly about the importance of staying compliant with his medicine in the hopes that he will finally decide to comply with treatment in order to avoid rehospitalization.  Patient's baseline at this point seems to be pretty low functioning but he is not acutely dangerous not violent not suicidal.  Alethia Berthold, MD 01/27/2019, 9:35 AM

## 2019-01-27 NOTE — BHH Group Notes (Signed)
LCSW Group Therapy Note  01/27/2019 1:00PM   Type of Therapy/Topic:  Group Therapy:  Emotion Regulation  Participation Level:  Did Not Attend   Description of Group:   The purpose of this group is to assist patients in learning to regulate negative emotions and experience positive emotions. Patients will be guided to discuss ways in which they have been vulnerable to their negative emotions. These vulnerabilities will be juxtaposed with experiences of positive emotions or situations, and patients will be challenged to use positive emotions to combat negative ones. Special emphasis will be placed on coping with negative emotions in conflict situations, and patients will process healthy conflict resolution skills.  Therapeutic Goals: 1. Patient will identify two positive emotions or experiences to reflect on in order to balance out negative emotions 2. Patient will label two or more emotions that they find the most difficult to experience 3. Patient will demonstrate positive conflict resolution skills through discussion and/or role plays  Summary of Patient Progress: X  Therapeutic Modalities:   Cognitive Behavioral Therapy Feelings Identification Dialectical Behavioral Therapy  Deveion Denz, MSW, LCSW 01/27/2019 10:37 AM  

## 2019-01-27 NOTE — BHH Counselor (Signed)
Pt scheduled to discharge today. CSW met with pt to review discharge plan(f/u [facility]). CSW received mother's phone number from nurse [phone]); called and left a confidential Vm.

## 2019-01-27 NOTE — Discharge Summary (Signed)
Physician Discharge Summary Note  Patient:  [name] [name] is an 23 y.o., male MRN:  [id number] DOB:  07-09-1996 Patient phone:  [phone] (home)  Patient address:   [address] [place] [address],  Total Time spent with patient: 45 minutes  Date of Admission:  01/02/2019 Date of Discharge: January 27, 2019  Reason for Admission: Patient admitted to the hospital because of worsening schizophrenia.  Had become nearly catatonic withdrawn unable to even toilet himself as a result of psychosis and being off of his medicine.  Principal Problem: Schizoaffective disorder, bipolar type [facility]) Discharge Diagnoses: Principal Problem:   Schizoaffective disorder, bipolar type ([place]) Active Problems:   Aggressive behavior of adolescent   Noncompliance   Hypertension   Elevated CPK   Past Psychiatric History: Patient has schizophrenia or schizoaffective disorder which has developed rapidly over the last couple years.  He has a history of frequent noncompliance which has resulted in multiple hospitalizations.  He does not have a history of violence or active suicidality but when psychotic will often make bad decisions or be unable to care for himself.  Previously stabilized on clozapine.  He does have act team services.  Past Medical History:  Past Medical History:  Diagnosis Date  . Asthma   . Depression   . Psychosis [facility])     Past Surgical History:  Procedure Laterality Date  . BACK SURGERY     Family History: History reviewed. No pertinent family history. Family Psychiatric  History: See previous.  None reported Social History:  Social History   Substance and Sexual Activity  Alcohol Use No     Social History   Substance and Sexual Activity  Drug Use Yes  . Types: Marijuana    Social History   Socioeconomic History  . Marital status: Single    Spouse name: Not on file  . Number of children: Not on file  . Years of education: Not on file  . Highest education  level: Not on file  Occupational History  . Not on file  Social Needs  . Financial resource strain: Not on file  . Food insecurity    Worry: Not on file    Inability: Not on file  . Transportation needs    Medical: Not on file    Non-medical: Not on file  Tobacco Use  . Smoking status: Never Smoker  . Smokeless tobacco: Never Used  Substance and Sexual Activity  . Alcohol use: No  . Drug use: Yes    Types: Marijuana  . Sexual activity: Not Currently  Lifestyle  . Physical activity    Days per week: Not on file    Minutes per session: Not on file  . Stress: Not on file  Relationships  . Social Herbalist on phone: Not on file    Gets together: Not on file    Attends religious service: Not on file    Active member of club or organization: Not on file    Attends meetings of clubs or organizations: Not on file    Relationship status: Not on file  Other Topics Concern  . Not on file  Social History Narrative  . Not on file    Hospital Course: Patient admitted to the psychiatric hospital.  15-minute checks employed.  Patient did not show any dangerous or aggressive behavior throughout his time in the hospital.  He was compliant with eating and with medication but rarely if ever attended groups.  Throughout his hospital stay spent most of his day in bed wrapped up in a blanket.  Every day I would go into talk with him and he would be awake and able to sit up and converse and did not exactly appear to be catatonic so much is having very serious negative symptoms of schizophrenia.  I spoke with the lead physician from his act team a couple times while he was in the hospital.  She suggested we consider long-acting injectables because of his history of noncompliance.  Based on this we started him on Invega, and he has had the full loading regimen.  In addition however we also continued his clozapine which had been successful in the past.  Patient did show a little bit of  improvement in terms of occasionally coming out of his room more being able to have more active lucid conversations but still shows a lot of lack of motivation.  Patient's blood sugar was fine no sign of elevation.  He did not need the metformin.  His blood pressure was creeping up a little bit and he is now on amlodipine at a modest dose with good control of his blood pressure.  Patient has been educated repeatedly about the need to stay compliant with his medicine although I do not know how much of it really sinks in and given how flat his affect is and how little he speaks.  Act team is aware of the plan.  We did give [facility] however under the current circumstances meaning the COVID virus and the lack of resources available in the public sphere it seemed unlikely to nearly impossible for us to get [facility] into [facility] since he is not actually violent.  I expressed to him that I agreed with them however that going someplace for some longer term rehab to improve his functioning would probably be good for him.  As with most things, [name] [name] would passively agree but not really show any enthusiasm.  He is being discharged now back home where he lives with his mother.  Prescriptions given for all of his medicine.  I will also mention that he has continued his Depakote throughout his hospital stay with a therapeutic level.  Also we checked a clozapine level and it was quite high possibly related in part to the Luvox.  I backed off a bit on the dose of the clozapine.  Also added DDAVP because he had a couple of episodes of nocturnal enuresis when he was back on the clozapine.  That seems to of been of benefit.  Physical Findings: AIMS:  , ,  ,  ,    CIWA:    COWS:     Musculoskeletal: Strength & Muscle Tone: within normal limits Gait & Station: normal Patient leans: N/A  Psychiatric Specialty Exam: Physical Exam  Nursing note and vitals  reviewed. Constitutional: He appears well-developed and well-nourished.  HENT:  Head: Normocephalic and atraumatic.  Eyes: Pupils are equal, round, and reactive to light. Conjunctivae are normal.  Neck: Normal range of motion.  Cardiovascular: Regular rhythm and normal heart sounds.  Respiratory: Effort normal. No respiratory distress.  GI: Soft.  Musculoskeletal: Normal range of motion.  Neurological: He is alert.  Skin: Skin is warm and dry.  Psychiatric: His affect is blunt. His speech is delayed. He is slowed. Thought content is not paranoid. Cognition and memory are impaired. He expresses inappropriate judgment. He expresses no homicidal and no suicidal ideation.  Review of Systems  Constitutional: Negative.   HENT: Negative.   Eyes: Negative.   Respiratory: Negative.   Cardiovascular: Negative.   Gastrointestinal: Negative.   Musculoskeletal: Negative.   Skin: Negative.   Neurological: Negative.   Psychiatric/Behavioral: Negative.     Blood pressure (!) 126/94, pulse 99, temperature 97.6 F (36.4 C), temperature source Oral, resp. rate 18, height 6' (1.829 m), weight 94.3 kg, SpO2 100 %.Body mass index is 28.2 kg/m.  General Appearance: Casual  Eye Contact:  Fair  Speech:  Slow  Volume:  Decreased  Mood:  Euthymic  Affect:  Flat  Thought Process:  Coherent  Orientation:  Full (Time, Place, and Person)  Thought Content:  Logical  Suicidal Thoughts:  No  Homicidal Thoughts:  No  Memory:  Immediate;   Fair Recent;   Fair Remote;   Fair  Judgement:  Impaired  Insight:  Shallow  Psychomotor Activity:  Decreased  Concentration:  Concentration: Poor  Recall:  Fair  Fund of Knowledge:  Fair  Language:  Fair  Akathisia:  No  Handed:  Right  AIMS (if indicated):     Assets:  Desire for Improvement Housing Physical Health Resilience Social Support  ADL's:  Impaired  Cognition:  Impaired,  Mild  Sleep:  Number of Hours: 7.15     Have you used any form of  tobacco in the last 30 days? (Cigarettes, Smokeless Tobacco, Cigars, and/or Pipes): No  Has this patient used any form of tobacco in the last 30 days? (Cigarettes, Smokeless Tobacco, Cigars, and/or Pipes) Yes, No  Blood Alcohol level:  Lab Results  Component Value Date   ETH <10 01/01/2019   ETH <10 08/02/2018    Metabolic Disorder Labs:  Lab Results  Component Value Date   HGBA1C 5.1 08/10/2018   MPG 99.67 08/10/2018   MPG 93.93 03/08/2018   No results found for: PROLACTIN Lab Results  Component Value Date   CHOL 175 08/10/2018   TRIG 103 08/10/2018   HDL 46 08/10/2018   CHOLHDL 3.8 08/10/2018   VLDL 21 08/10/2018   LDLCALC 108 (H) 08/10/2018   LDLCALC 168 (H) 03/08/2018    See Psychiatric Specialty Exam and Suicide Risk Assessment completed by Attending Physician prior to discharge.  Discharge destination:  Home  Is patient on multiple antipsychotic therapies at discharge:  Yes,   Do you recommend tapering to monotherapy for antipsychotics?  No   Has Patient had three or more failed trials of antipsychotic monotherapy by history:  Yes,   Antipsychotic medications that previously failed include:   1.  Risperdal., 2.  Olanzapine. and 3.  Clozapine.  Recommended Plan for Multiple Antipsychotic Therapies: Second antipsychotic is Clozapine.  Reason for adding Clozapine Patient shows still an adequate response even on full dosage of clozapine and additionally has a history of repeated noncompliance with oral medicines.  Additional medicine is a long-acting injectable to help bridge him through periods of noncompliance.  He is tolerating the medicine without any problem at this point.  Discharge Instructions    Diet - low sodium heart healthy   Complete by: As directed    Increase activity slowly   Complete by: As directed      Allergies as of 01/27/2019   No Known Allergies     Medication List    STOP taking these medications   benztropine 0.5 MG tablet Commonly known  as: COGENTIN   metFORMIN 500 MG tablet Commonly known as: GLUCOPHAGE   metoprolol tartrate 25 MG  tablet Commonly known as: LOPRESSOR     TAKE these medications     Indication  amLODipine 5 MG tablet Commonly known as: NORVASC Take 1 tablet (5 mg total) by mouth daily. Start taking on: January 28, 2019  Indication: High Blood Pressure Disorder   cloZAPine 100 MG tablet Commonly known as: CLOZARIL Take 2.5 tablets (250 mg total) by mouth at bedtime. What changed: how much to take  Indication: Schizoaffective Disorder   desmopressin 0.1 MG tablet Commonly known as: DDAVP Take 1 tablet (0.1 mg total) by mouth at bedtime.  Indication: Bedwetting   divalproex 500 MG DR tablet Commonly known as: DEPAKOTE Take 1 tablet (500 mg total) by mouth every 12 (twelve) hours.  Indication: Schizophrenia   fluvoxaMINE 25 MG tablet Commonly known as: LUVOX Take 1 tablet (25 mg total) by mouth at bedtime.  Indication: augmentation of Clozapine   paliperidone 156 MG/ML Susy injection Commonly known as: INVEGA SUSTENNA Inject 1 mL (156 mg total) into the muscle every 28 (twenty-eight) days. Start taking on: February 17, 2019  Indication: Schizophrenia   traZODone 150 MG tablet Commonly known as: DESYREL Take 1 tablet (150 mg total) by mouth at bedtime. What changed:   medication strength  how much to take  Indication: Trouble Sleeping      Follow-up Information    [facility] [place] [place] Follow up.   Why: Please continue to follow up with you ACTT team as usual. Thank you! Contact information: [address] [place] [id number] [phone]        [facility] .   Specialty: Behavioral Health Contact information: [address] [place] [id number] [phone]          Follow-up recommendations:  Activity:  As tolerated Diet:  Regular Other:  Follow-up with outpatient treatment  Comments: Act team aware of  his discharge.  Mother aware.  Patient agreeable.  Prescriptions provided.  Signed: Mordecai RasmussenJohn Brinklee Cisse, MD 01/27/2019, 9:44 AM

## 2019-01-27 NOTE — Progress Notes (Signed)
  [facility] Adult Case Management Discharge Plan :  Will you be returning to the same living situation after discharge:  Yes,  lives with parent At discharge, do you have transportation home?: Yes,  mother will pick up at 2pm Do you have the ability to pay for your medications: Yes,  insurance  Release of information consent forms completed and in the chart;  Patient's signature needed at discharge.  Patient to Follow up at: Follow-up [place]. Follow up.   Why: Please continue to follow up with you ACTT team as usual. Thank you! Contact information: [place] [address] [phone]        [facility] .   Specialty: [place] information: [place]. [place] [phone]          Next level of care provider has access to [place] and Suicide Prevention discussed: Yes,  with pt; pt declined family contact  Have you used any form of tobacco in the last 30 days? (Cigarettes, Smokeless Tobacco, Cigars, and/or Pipes): No  Has patient been referred to the Quitline?: N/A patient is not a smoker  Patient has been referred for addiction treatment: N/A  Yvette Rack, LCSW 01/27/2019, 9:58 AM

## 2019-01-27 NOTE — Progress Notes (Addendum)
D: Patient is aware of  Discharge this shift .  A: Patient denies suicidal /homicidal ideations. Patient received all belongings brought in   No Storage medications. Writer reviewed Discharge Summary, Suicide Risk Assessment, and Transitional Record. Patient also received Prescriptions   from  MD.. Aware  Of follow up appointment . R: Patient left unit with no questions  Or concerns  With  Mother. 

## 2019-04-18 ENCOUNTER — Emergency Department: Payer: Medicaid Other

## 2019-04-18 ENCOUNTER — Other Ambulatory Visit: Payer: Self-pay

## 2019-04-18 ENCOUNTER — Emergency Department
Admission: EM | Admit: 2019-04-18 | Discharge: 2019-04-18 | Disposition: A | Discharge status: Home / Self Care | Payer: Medicaid Other | Attending: Emergency Medicine | Admitting: Emergency Medicine

## 2019-04-18 ENCOUNTER — Encounter: Payer: Self-pay | Admitting: Emergency Medicine

## 2019-04-18 DIAGNOSIS — R102 Pelvic and perineal pain: Secondary | ICD-10-CM | POA: Diagnosis present

## 2019-04-18 DIAGNOSIS — I10 Essential (primary) hypertension: Secondary | ICD-10-CM | POA: Diagnosis not present

## 2019-04-18 DIAGNOSIS — Z79899 Other long term (current) drug therapy: Secondary | ICD-10-CM | POA: Diagnosis not present

## 2019-04-18 DIAGNOSIS — L02215 Cutaneous abscess of perineum: Secondary | ICD-10-CM | POA: Diagnosis not present

## 2019-04-18 DIAGNOSIS — J45909 Unspecified asthma, uncomplicated: Secondary | ICD-10-CM | POA: Diagnosis not present

## 2019-04-18 DIAGNOSIS — L0291 Cutaneous abscess, unspecified: Secondary | ICD-10-CM

## 2019-04-18 HISTORY — DX: Schizoaffective disorder, unspecified: F25.9

## 2019-04-18 LAB — CBC WITH DIFFERENTIAL/PLATELET
Abs Immature Granulocytes: 0.05 10*3/uL (ref 0.00–0.07)
Basophils Absolute: 0 10*3/uL (ref 0.0–0.1)
Basophils Relative: 0 %
Eosinophils Absolute: 0 10*3/uL (ref 0.0–0.5)
Eosinophils Relative: 0 %
HCT: 41.8 % (ref 39.0–52.0)
Hemoglobin: 14.8 g/dL (ref 13.0–17.0)
Immature Granulocytes: 1 %
Lymphocytes Relative: 30 %
Lymphs Abs: 2 10*3/uL (ref 0.7–4.0)
MCH: 28.4 pg (ref 26.0–34.0)
MCHC: 35.4 g/dL (ref 30.0–36.0)
MCV: 80.2 fL (ref 80.0–100.0)
Monocytes Absolute: 0.9 10*3/uL (ref 0.1–1.0)
Monocytes Relative: 13 %
Neutro Abs: 3.8 10*3/uL (ref 1.7–7.7)
Neutrophils Relative %: 56 %
Platelets: 253 10*3/uL (ref 150–400)
RBC: 5.21 MIL/uL (ref 4.22–5.81)
RDW: 12.3 % (ref 11.5–15.5)
WBC: 6.8 10*3/uL (ref 4.0–10.5)
nRBC: 0 % (ref 0.0–0.2)

## 2019-04-18 LAB — COMPREHENSIVE METABOLIC PANEL
ALT: 24 U/L (ref 0–44)
AST: 20 U/L (ref 15–41)
Albumin: 3.9 g/dL (ref 3.5–5.0)
Alkaline Phosphatase: 68 U/L (ref 38–126)
Anion gap: 9 (ref 5–15)
BUN: 12 mg/dL (ref 6–20)
CO2: 25 mmol/L (ref 22–32)
Calcium: 8.9 mg/dL (ref 8.9–10.3)
Chloride: 103 mmol/L (ref 98–111)
Creatinine, Ser: 0.76 mg/dL (ref 0.61–1.24)
GFR calc Af Amer: >60 mL/min (ref >60)
GFR calc non Af Amer: >60 mL/min (ref >60)
Glucose, Bld: 99 mg/dL (ref 70–99)
Potassium: 3.9 mmol/L (ref 3.5–5.1)
Sodium: 137 mmol/L (ref 135–145)
Total Bilirubin: 0.7 mg/dL (ref 0.3–1.2)
Total Protein: 6.9 g/dL (ref 6.5–8.1)

## 2019-04-18 LAB — LACTIC ACID, PLASMA: Lactic Acid, Venous: 1 mmol/L (ref 0.5–1.9)

## 2019-04-18 LAB — PROTIME-INR
INR: 1 (ref 0.8–1.2)
Prothrombin Time: 12.8 seconds (ref 11.4–15.2)

## 2019-04-18 LAB — PROCALCITONIN: Procalcitonin: <0.1 ng/mL

## 2019-04-18 MED ORDER — OXYCODONE HCL 5 MG PO TABS: 5.0000 mg | ORAL_TABLET | Freq: Three times a day (TID) | ORAL | 0 refills | Status: DC | PRN

## 2019-04-18 MED ORDER — ACETAMINOPHEN 500 MG PO TABS
1000.0000 mg | ORAL_TABLET | Freq: Once | ORAL | Status: AC
  Administered 2019-04-18: 1000 mg via ORAL
  Filled 2019-04-18: qty 2

## 2019-04-18 MED ORDER — LIDOCAINE HCL (PF) 1 % IJ SOLN
5.0000 mL | Freq: Once | INTRAMUSCULAR | Status: AC
  Administered 2019-04-18: 09:00:00 5 mL

## 2019-04-18 MED ORDER — CLINDAMYCIN HCL 300 MG PO CAPS: 300.0000 mg | ORAL_CAPSULE | Freq: Three times a day (TID) | ORAL | 0 refills | Status: AC

## 2019-04-18 MED ORDER — LIDOCAINE HCL (PF) 1 % IJ SOLN
INTRAMUSCULAR | Status: AC
  Administered 2019-04-18: 09:00:00 5 mL
  Filled 2019-04-18: qty 5

## 2019-04-18 MED ORDER — CLINDAMYCIN HCL 300 MG PO CAPS: 300.0000 mg | ORAL_CAPSULE | Freq: Three times a day (TID) | ORAL | 0 refills | Status: DC

## 2019-04-18 NOTE — ED Notes (Signed)
Dr. Jari Pigg performing bedside US to better visualize area.  This RN at bedside with MD.

## 2019-04-18 NOTE — ED Triage Notes (Signed)
Pt reports an abscess near his anus that he noticed yesterday; denies drainage; denies fever; pt says the area is getting bigger; pt says he's had an abscess in the same area before that required I & D;

## 2019-04-18 NOTE — Discharge Instructions (Addendum)
You had a small perineal abscess.  We did a bedside I&D for it.  We did get some drainage out of it and placed some packing.  You should follow-up with the surgery team on Tuesday.  You should call them to make an appointment. Tell them you were seen in the ED. You can take the antibiotics to help prevent any infection.  You can take Tylenol 1 g every 8 hours.  Take oxycodone for breakthrough pain only.  Do not drive while taking it. Take a stool softener such as MiraLAX daily.   Return to ER for fevers, redness spreading up into the groin or any other concerns.   Focal skin thickening with underlying subcutaneous edema and a 2 cm complex fluid collection. Abscess is a distinct consideration.

## 2019-04-18 NOTE — ED Provider Notes (Signed)
[facility] Emergency Department Provider Note  ____________________________________________   First MD Initiated Contact with Patient 04/18/19 [phone]     (approximate)  I have reviewed the triage vital signs and the nursing notes.   HISTORY  Chief Complaint Abscess    HPI [name] [name] is a 23 y.o. male with schizoaffective, recurrent abscesses who presents with abscess near his anus.  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  It last occurred 1 year ago.  According to mom it first was drained in the OR under anesthesia.  However since then he has had some bedside drainages as well as recurrent OR drainages over at [facility].  Patient says that this time he noticed the pain 2 days ago.  The pain is been moderate, constant, worse with sitting, better with not touching the area.  Has not been taking any medications to try to help it.  He denies any fevers, chills.  He feels that he is mentally at a good state.  Mom is at bedside as well.   Past Medical History:  Diagnosis Date  . Asthma   . Depression   . Psychosis ([place])   . Schizoaffective disorder [facility])     Patient Active Problem List   Diagnosis Date Noted  . Elevated CPK 01/04/2019  . Hypertension 03/06/2018  . Schizoaffective disorder, bipolar type ([place]) 12/30/2017  . Noncompliance 12/30/2017  . Aggressive behavior of adolescent 10/24/2015    Past Surgical History:  Procedure Laterality Date  . BACK SURGERY      Prior to Admission medications   Medication Sig Start Date End Date Taking? Authorizing Provider  amLODipine (NORVASC) 5 MG tablet Take 1 tablet (5 mg total) by mouth daily. 01/28/19  Yes [name], Madie Reno, MD  cloZAPine (CLOZARIL) 100 MG tablet Take 2.5 tablets (250 mg total) by mouth at bedtime. 01/27/19  Yes [name], Madie Reno, MD  desmopressin (DDAVP) 0.1 MG tablet Take 1 tablet (0.1 mg total) by mouth at bedtime. 01/27/19  Yes [name], Madie Reno, MD  divalproex (DEPAKOTE)  500 MG DR tablet Take 1 tablet (500 mg total) by mouth every 12 (twelve) hours. 01/27/19  Yes [name], Madie Reno, MD  fluvoxaMINE (LUVOX) 25 MG tablet Take 1 tablet (25 mg total) by mouth at bedtime. 01/27/19  Yes [name], Madie Reno, MD  paliperidone (INVEGA SUSTENNA) 156 MG/ML SUSY injection Inject 1 mL (156 mg total) into the muscle every 28 (twenty-eight) days. 02/17/19  Yes [name], Madie Reno, MD  traZODone (DESYREL) 150 MG tablet Take 1 tablet (150 mg total) by mouth at bedtime. 01/27/19  Yes [name], Madie Reno, MD    Allergies Patient has no known allergies.  No family history on file.  Social History Social History   Tobacco Use  . Smoking status: Never Smoker  . Smokeless tobacco: Never Used  Substance Use Topics  . Alcohol use: No  . Drug use: Never      Review of Systems Constitutional: No fever/chills Eyes: No visual changes. ENT: No sore throat. Cardiovascular: Denies chest pain. Respiratory: Denies shortness of breath. Gastrointestinal: No abdominal pain.  No nausea, no vomiting.  No diarrhea.  No constipation. Genitourinary: Perineal pain Musculoskeletal: Negative for back pain. Skin: Perineal fluctuation Neurological: Negative for headaches, focal weakness or numbness. All other ROS negative ____________________________________________   PHYSICAL EXAM:  VITAL SIGNS: ED Triage Vitals  Enc Vitals Group     BP 04/18/19 0534 139/82     Pulse Rate 04/18/19 0534 Marland Kitchen)  112     Resp 04/18/19 0534 16     Temp 04/18/19 0534 98 F (36.7 C)     Temp Source 04/18/19 0534 Oral     SpO2 04/18/19 0534 98 %     Weight 04/18/19 0535 230 lb (104.3 kg)     Height 04/18/19 0535 6' (1.829 m)     Head Circumference --      Peak Flow --      Pain Score 04/18/19 0535 6     Pain Loc --      Pain Edu? --      Excl. in GC? --     Constitutional: Alert and oriented. Well appearing and in no acute distress. Eyes: Conjunctivae are normal. EOMI. Head: Atraumatic. Nose: No  congestion/rhinnorhea. Mouth/Throat: Mucous membranes are moist.   Neck: No stridor. Trachea Midline. FROM Cardiovascular: Tachycardic, regular rhythm. Grossly normal heart sounds.  Good peripheral circulation. Respiratory: Normal respiratory effort.  No retractions. Lungs CTAB. Gastrointestinal: Soft and nontender. No distention. No abdominal bruits.  Musculoskeletal: No lower extremity tenderness nor edema.  No joint effusions. Neurologic:  Normal speech and language. No gross focal neurologic deficits are appreciated.  Skin:  Skin is warm, dry and intact except whats noted below.  Psychiatric: Mood and affect are normal. Speech and behavior are normal. GU: 2cm x2cm palpable mass in the perineum.  No redness. No involvement of scrotum or rectal.  Scar tissue and well healing scar from prior incisions.   ____________________________________________   LABS (all labs ordered are listed, but only abnormal results are displayed)  Labs Reviewed  LACTIC ACID, PLASMA  PROCALCITONIN  CBC WITH DIFFERENTIAL/PLATELET  PROTIME-INR  COMPREHENSIVE METABOLIC PANEL  LACTIC ACID, PLASMA   ____________________________________________  RADIOLOGY Vela Prose, personally viewed and evaluated these images (plain radiographs) as part of my medical decision making, as well as reviewing the written report by the radiologist.  ED MD interpretation:  Reviewed [place] personally and saw about 2cm fluid collection.   Official radiology report(s): US Pelvis Limited  Result Date: 04/18/2019 CLINICAL DATA:  History of perineal abscess with possible recurrence. EXAM: US PELVIS LIMITED TECHNIQUE: Ultrasound examination of the pelvic soft tissues was performed in the area of clinical concern. COMPARISON:  None. FINDINGS: Focused ultrasound exam of the perineum was performed. Patient localized the area of concern. 1.6 x 2.0 x 1.1 cm complex fluid collection with enhanced through transmission identified just deep to  the skin. There may be some overlying skin thickening and localized subcutaneous edema is evident. IMPRESSION: Focal skin thickening with underlying subcutaneous edema and a 2 cm complex fluid collection. Abscess is a distinct consideration. Electronically Signed   By: Kennith Center M.D.   On: 04/18/2019 08:46    ____________________________________________   PROCEDURES  Procedure(s) performed (including Critical Care):  Marland KitchenMarland KitchenIncision and Drainage  Date/Time: 04/18/2019 8:56 AM Performed by: Concha Se, MD Authorized by: Concha Se, MD   Consent:    Consent obtained:  Verbal   Consent given by:  Patient and parent   Risks discussed:  Bleeding, damage to other organs, incomplete drainage, infection and pain   Alternatives discussed:  No treatment Location:    Type:  Abscess   Size:  2cm   Location:  Anogenital   Anogenital location:  Perineum Pre-procedure details:    Skin preparation:  Chloraprep Anesthesia (see MAR for exact dosages):    Anesthesia method:  Local infiltration   Local anesthetic:  Lidocaine 1% w/o epi Procedure  type:    Complexity:  Simple Procedure details:    Needle aspiration: no     Incision types:  Single straight   Scalpel blade:  11   Wound management:  Probed and deloculated   Drainage:  Purulent   Drainage amount:  Moderate   Packing materials:  1/4 in gauze Post-procedure details:    Patient tolerance of procedure:  Tolerated well, no immediate complications     ____________________________________________   INITIAL IMPRESSION / ASSESSMENT AND PLAN / ED COURSE  [name] [name] was evaluated in Emergency Department on 04/18/2019 for the symptoms described in the history of present illness. He was evaluated in the context of the global COVID-19 pandemic, which necessitated consideration that the patient might be at risk for infection with the SARS-CoV-2 virus that causes COVID-19. Institutional protocols and algorithms that pertain to the  evaluation of patients at risk for COVID-19 are in a state of rapid change based on information released by regulatory bodies including the CDC and federal and state organizations. These policies and algorithms were followed during the patient's care in the ED.    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  Patient has required  OR drainage but also had bedside drainage.  On exam patient has what feels like a 2 cm x 2 cm hard palpable structure.  There is no erythema.  There is no additional induration that travels into the scrotum or into the rectum area.  Will get ultrasound to evaluate to the exact size of the structure.  Will then discuss with surgery about bedside drainage versus OR drainage.  Patient has no evidence of bacteremia, sepsis.  No evidence of Fournier's gangrene.  US shows 2cmx1.5cmx1cm abscess in perineal region.    D/w Dr. Aleen CampiPiscoya from surgery given patient's labs are all normal and the area is a smaller area than prior abscesses he is okay with us doing a bedside I&D and he will follow-up with the patient early next week.  Bedside I&D performed with moderate amount of discharge.  Packing placed.  Patient be discharged on a course of clindamycin.  Discussed with mother Tylenol for pain but given a few oxycodone for mother to be in charge of to give as needed for increasing pain.  They understand that they should call surgery on Monday to get an appointment hopefully on Tuesday.  If their appointment is after then I discussed with mother about how to change the packing.  We discussed return precautions including fevers and increasing redness.  Mother feels comfortable keeping an eye on the area.     ____________________________________________   FINAL CLINICAL IMPRESSION(S) / ED DIAGNOSES   Final diagnoses:  Abscess      MEDICATIONS GIVEN DURING THIS VISIT:  Medications  lidocaine (PF) (XYLOCAINE) 1 % injection 5 mL (5 mLs  Infiltration Given 04/18/19 0847)  acetaminophen (TYLENOL) tablet 1,000 mg (1,000 mg Oral Given 04/18/19 0737)     ED Discharge Orders         Ordered    clindamycin (CLEOCIN) 300 MG capsule  3 times daily     04/18/19 0851    oxyCODONE (ROXICODONE) 5 MG immediate release tablet  Every 8 hours PRN     04/18/19 0853           Note:  This document was prepared using Dragon voice recognition software and may include unintentional dictation errors.   Concha SeFunke, Journie Howson E, MD 04/18/19 [phone]

## 2019-04-18 NOTE — ED Provider Notes (Signed)
Patient's mother called back Suzie Portela is now close she asked me to call a prescription for the clindamycin 303 times a day for 10 days to [facility] in [facility] I have done so she can pick up the Percocet from [place] when they open she said and I have no problem with that either.   Nena Polio, MD 04/18/19 [phone]

## 2019-04-21 ENCOUNTER — Ambulatory Visit: Payer: Medicaid Other | Admitting: Surgery

## 2019-04-21 ENCOUNTER — Encounter: Payer: Self-pay | Admitting: Surgery

## 2019-04-21 ENCOUNTER — Other Ambulatory Visit: Payer: Self-pay

## 2019-04-21 VITALS — BP 112/72 | HR 118 | Temp 97.9°F | Resp 14 | Ht 72.0 in | Wt 248.6 lb

## 2019-04-21 DIAGNOSIS — L02215 Cutaneous abscess of perineum: Secondary | ICD-10-CM | POA: Diagnosis not present

## 2019-04-21 MED ORDER — SULFAMETHOXAZOLE-TRIMETHOPRIM 400-80 MG PO TABS: 1.0000 | ORAL_TABLET | Freq: Two times a day (BID) | ORAL | 0 refills | Status: DC

## 2019-04-21 MED ORDER — SULFAMETHOXAZOLE-TRIMETHOPRIM 800-160 MG PO TABS: 1.0000 | ORAL_TABLET | Freq: Two times a day (BID) | ORAL | 0 refills | Status: AC

## 2019-04-21 NOTE — Patient Instructions (Signed)
Please pick up your medication at the [facility]. Place a dry gauze dressing daily to the area. Please be sure to wash the area with soap and water daily. Please see your follow up appointment listed below.

## 2019-04-21 NOTE — Progress Notes (Signed)
04/21/2019  Reason for Visit:  Perineal abscess  History of Present Illness: [name] [name] is a 23 y.o. male presents for follow up from I&D of perineal abscess in the ED on 04/18/19.  Patient had a history of recurrent abscess and has had one prior I&D in the OR as well as one at bedside done at [facility].  Patient presented to ED with a 2 day history of perineal pain, worse with sitting.  Denied any fevers or chills.  Denies any drainage at the time of ED visit.  He had a 2 x 2 cm area of erythema and fluctuance.  U/S showed a 1.6 x 2 x 1.1 cm abscess.  He underwent a bedside I&D with purulent fluid drained, and packed with 1/4 inch gauze.  He was started on Clindamycin and cultures were sent.  Of note the I&D has been in the same location each time per the patient.  He reports today that he's doing well, without any further pain.  He describes some yellow drainage but no purulent fluid.  Denies any further induration or erythema.  The packing fell off and has not been replaced.    Past Medical History: Past Medical History:  Diagnosis Date  . Asthma   . Depression   . Psychosis ([facility])   . Schizoaffective disorder [facility])      Past Surgical History: Past Surgical History:  Procedure Laterality Date  . BACK SURGERY    . I&D groin  2017    Home Medications: Prior to Admission medications   Medication Sig Start Date End Date Taking? Authorizing Provider  amLODipine (NORVASC) 5 MG tablet Take 1 tablet (5 mg total) by mouth daily. 01/28/19  Yes [name], [name], MD  clindamycin (CLEOCIN) 300 MG capsule Take 1 capsule (300 mg total) by mouth 3 (three) times daily for 10 days. 04/18/19 04/28/19 Yes Concha Se, MD  cloZAPine (CLOZARIL) 100 MG tablet Take 2.5 tablets (250 mg total) by mouth at bedtime. 01/27/19  Yes [name], [name], MD  divalproex (DEPAKOTE) 500 MG DR tablet Take 1 tablet (500 mg total) by mouth every 12 (twelve) hours. 01/27/19  Yes [name], [name], MD  fluvoxaMINE (LUVOX) 25 MG tablet  Take 1 tablet (25 mg total) by mouth at bedtime. 01/27/19  Yes [name], [name], MD  paliperidone (INVEGA SUSTENNA) 156 MG/ML SUSY injection Inject 1 mL (156 mg total) into the muscle every 28 (twenty-eight) days. 02/17/19  Yes [name], [name], MD    Allergies: No Known Allergies  Social History:  reports that he has never smoked. He has never used smokeless tobacco. He reports that he does not drink alcohol or use drugs.   Family History: History reviewed. No pertinent family history.  Review of Systems: Review of Systems  Constitutional: Negative for chills and fever.  HENT: Negative for hearing loss.   Respiratory: Negative for cough.   Cardiovascular: Negative for chest pain.  Gastrointestinal: Negative for abdominal pain, nausea and vomiting.  Genitourinary: Negative for dysuria.  Musculoskeletal: Negative for myalgias.  Neurological: Negative for dizziness.  Psychiatric/Behavioral: Negative for depression.    Physical Exam BP 112/72   Pulse (!) 118   Temp 97.9 F (36.6 C) (Temporal)   Resp 14   Ht 6' (1.829 m)   Wt 248 lb 9.6 oz (112.8 kg)   SpO2 95%   BMI 33.72 kg/m  CONSTITUTIONAL: No acute distress HEENT:  Normocephalic, atraumatic, extraocular motion intact. NECK: Trachea is midline, and there is no  jugular venous distension.  RESPIRATORY:  Lungs are clear, and breath sounds are equal bilaterally. Normal respiratory effort without pathologic use of accessory muscles. CARDIOVASCULAR: Heart is regular without murmurs, gallops, or rubs. GI: The abdomen is soft, non-distended, non-tender.  GU/RECTAL:  Patient has a perineal wound from I&D which is healing well.  There is no surrounding erythema or induration.  The wound is located about 3-4 cm anterior to the anal verge.  Q tip was used to probe the wound and no further purulent fluid was noted.  Healthy wound edges.  On rectal exam, there was a small irregularity that could be felt on the anterior anal canal.  No other  lesions palpable and no gross blood. MUSCULOSKELETAL:  Normal muscle strength and tone in all four extremities.  No peripheral edema or cyanosis. SKIN: Skin turgor is normal. There are no pathologic skin lesions.  NEUROLOGIC:  Motor and sensation is grossly normal.  Cranial nerves are grossly intact. PSYCH:  Alert and oriented to person, place and time. Affect is normal.  Laboratory Analysis: Labs 04/18/19: Na 137, K 3.9, Cl 103, CO2 25, BUN 12, Cr 0.76.  LFTs within normal.  Lactic acid 1.0.  WBC 6.8, Hgb 14.8, Hct 41.8, Plt 253.  Imaging: U/S 04/18/19: FINDINGS: Focused ultrasound exam of the perineum was performed. Patient localized the area of concern. 1.6 x 2.0 x 1.1 cm complex fluid collection with enhanced through transmission identified just deep to the skin. There may be some overlying skin thickening and localized subcutaneous edema is evident.  IMPRESSION: Focal skin thickening with underlying subcutaneous edema and a 2 cm complex fluid collection. Abscess is a distinct consideration.  Assessment and Plan: This is a 23 y.o. male with a history of recurrent perineal abscess.  Discussed with the patient that currently no further I&D needs to be done.  The wound appears clean, with infection controlled.  Culture data shows the abscess is growing E coli, pan-sensitive.  Will change the antibiotic from clindamycin to Bactrim for better coverage.  Discussed with the patient that there is a chance based on exam that he may have a perianal fistula that's causing the abscess.  At this point, the treatment plan would be the same with antibiotic treatment and I&D which has been done.  We would have to see if this recurs again and when.  If that happens, then he may need referral to colorectal surgeon to evaluate and treat for possible fistula.  Patient will follow up in 2 weeks for wound check.  Instructed to do dry gauze dressing changes daily and to keep the wound clean and dry.  He may  shower.  Face-to-face time spent with the patient and care providers was 30 minutes, with more than 50% of the time spent counseling, educating, and coordinating care of the patient.     Melvyn Neth, [facility]

## 2019-04-23 LAB — AEROBIC/ANAEROBIC CULTURE W GRAM STAIN (SURGICAL/DEEP WOUND)

## 2019-05-03 ENCOUNTER — Emergency Department
Admission: EM | Admit: 2019-05-03 | Discharge: 2019-05-04 | Disposition: A | Discharge status: Other Acute Inpatient Hospital | Payer: Medicaid Other | Attending: Emergency Medicine | Admitting: Emergency Medicine

## 2019-05-03 ENCOUNTER — Other Ambulatory Visit: Payer: Self-pay

## 2019-05-03 DIAGNOSIS — J45909 Unspecified asthma, uncomplicated: Secondary | ICD-10-CM | POA: Diagnosis not present

## 2019-05-03 DIAGNOSIS — R4689 Other symptoms and signs involving appearance and behavior: Secondary | ICD-10-CM | POA: Diagnosis present

## 2019-05-03 DIAGNOSIS — Z20828 Contact with and (suspected) exposure to other viral communicable diseases: Secondary | ICD-10-CM | POA: Insufficient documentation

## 2019-05-03 DIAGNOSIS — Z79899 Other long term (current) drug therapy: Secondary | ICD-10-CM | POA: Insufficient documentation

## 2019-05-03 DIAGNOSIS — F259 Schizoaffective disorder, unspecified: Secondary | ICD-10-CM | POA: Diagnosis not present

## 2019-05-03 DIAGNOSIS — I10 Essential (primary) hypertension: Secondary | ICD-10-CM | POA: Diagnosis not present

## 2019-05-03 DIAGNOSIS — Z046 Encounter for general psychiatric examination, requested by authority: Secondary | ICD-10-CM

## 2019-05-03 LAB — COMPREHENSIVE METABOLIC PANEL
ALT: 33 U/L (ref 0–44)
AST: 33 U/L (ref 15–41)
Albumin: 4.8 g/dL (ref 3.5–5.0)
Alkaline Phosphatase: 95 U/L (ref 38–126)
Anion gap: 9 (ref 5–15)
BUN: 11 mg/dL (ref 6–20)
CO2: 23 mmol/L (ref 22–32)
Calcium: 9.3 mg/dL (ref 8.9–10.3)
Chloride: 104 mmol/L (ref 98–111)
Creatinine, Ser: 0.93 mg/dL (ref 0.61–1.24)
GFR calc Af Amer: >60 mL/min (ref >60)
GFR calc non Af Amer: >60 mL/min (ref >60)
Glucose, Bld: 112 mg/dL — ABNORMAL HIGH (ref 70–99)
Potassium: 3.5 mmol/L (ref 3.5–5.1)
Sodium: 136 mmol/L (ref 135–145)
Total Bilirubin: 0.8 mg/dL (ref 0.3–1.2)
Total Protein: 8 g/dL (ref 6.5–8.1)

## 2019-05-03 LAB — CBC
HCT: 42.6 % (ref 39.0–52.0)
Hemoglobin: 15 g/dL (ref 13.0–17.0)
MCH: 28.2 pg (ref 26.0–34.0)
MCHC: 35.2 g/dL (ref 30.0–36.0)
MCV: 80.2 fL (ref 80.0–100.0)
Platelets: 319 10*3/uL (ref 150–400)
RBC: 5.31 MIL/uL (ref 4.22–5.81)
RDW: 12.3 % (ref 11.5–15.5)
WBC: 7 10*3/uL (ref 4.0–10.5)
nRBC: 0 % (ref 0.0–0.2)

## 2019-05-03 LAB — ETHANOL: Alcohol, Ethyl (B): <10 mg/dL (ref <10)

## 2019-05-03 LAB — SALICYLATE LEVEL: Salicylate Lvl: <7 mg/dL (ref 2.8–30.0)

## 2019-05-03 LAB — SARS CORONAVIRUS 2 BY RT PCR (HOSPITAL ORDER, PERFORMED IN ~~LOC~~ HOSPITAL LAB): SARS Coronavirus 2: NEGATIVE

## 2019-05-03 LAB — ACETAMINOPHEN LEVEL: Acetaminophen (Tylenol), Serum: <10 ug/mL — ABNORMAL LOW (ref 10–30)

## 2019-05-03 MED ORDER — AMLODIPINE BESYLATE 5 MG PO TABS: 5.0000 mg | ORAL_TABLET | Freq: Every day | ORAL | Status: DC

## 2019-05-03 MED ORDER — HALOPERIDOL LACTATE 5 MG/ML IJ SOLN
5.0000 mg | Freq: Four times a day (QID) | INTRAMUSCULAR | Status: DC | PRN
  Administered 2019-05-03: 22:00:00 5 mg via INTRAMUSCULAR
  Filled 2019-05-03: qty 1

## 2019-05-03 MED ORDER — LORAZEPAM 2 MG/ML IJ SOLN
2.0000 mg | INTRAMUSCULAR | Status: DC | PRN
  Administered 2019-05-03: 22:00:00 2 mg via INTRAMUSCULAR
  Filled 2019-05-03: qty 1

## 2019-05-03 MED ORDER — HALOPERIDOL 5 MG PO TABS: 5.0000 mg | ORAL_TABLET | Freq: Four times a day (QID) | ORAL | Status: DC | PRN

## 2019-05-03 MED ORDER — LORAZEPAM 2 MG PO TABS: 2.0000 mg | ORAL_TABLET | ORAL | Status: DC | PRN
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
## 2019-05-03 NOTE — ED Notes (Signed)
Called RN Amy and informed her of pt coming back to room assigned

## 2019-05-03 NOTE — ED Notes (Signed)
Report to include Situation, Background, Assessment, and Recommendations received from Amy B. RN. Patient alert and oriented, warm and dry, in no acute distress. Patient denies SI, HI, AVH and pain. Patient made aware of Q15 minute rounds and Rover and Officer presence for their safety. Patient instructed to come to me with needs or concerns.  
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
## 2019-05-03 NOTE — ED Notes (Signed)
Hourly rounding reveals patient sleeping in room. No complaints, stable, in no acute distress. Q15 minute rounds and monitoring via Rover and Officer to continue.  

## 2019-05-03 NOTE — ED Notes (Signed)
Hourly rounding reveals patient in room pacing after meds. Stable, in no acute distress. Q15 minute rounds and monitoring via Engineer, drilling to continue.

## 2019-05-03 NOTE — ED Notes (Signed)
Hourly rounding reveals patient pacing in room getting anxious. Stable, in no acute distress. Q15 minute rounds and monitoring via Engineer, drilling to continue.

## 2019-05-03 NOTE — ED Notes (Signed)
Patient still pacing asking about "getting out of here". Redirection minimally effective.

## 2019-05-03 NOTE — ED Notes (Signed)
Pt dressed out with EDT Mel and [employer] Officer. Pt belonging placed in bag to include: 1 black pair of shorts 1 gray underwear 1 red shirt
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
## 2019-05-03 NOTE — ED Notes (Signed)
IVC, pending placement 

## 2019-05-03 NOTE — ED Notes (Signed)
Called lab and spoke with Levada Dy and informed her that blood was sent down earlier and orders just now placed.
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
## 2019-05-04 ENCOUNTER — Inpatient Hospital Stay
Admission: AD | Admit: 2019-05-04 | Discharge: 2019-05-14 | DRG: 885 | Disposition: A | Discharge status: Home / Self Care | Payer: Medicaid Other | Attending: Psychiatry | Admitting: Psychiatry

## 2019-05-04 DIAGNOSIS — Z9114 Patient's other noncompliance with medication regimen: Secondary | ICD-10-CM | POA: Diagnosis not present

## 2019-05-04 DIAGNOSIS — I10 Essential (primary) hypertension: Secondary | ICD-10-CM | POA: Diagnosis present

## 2019-05-04 DIAGNOSIS — G4700 Insomnia, unspecified: Secondary | ICD-10-CM | POA: Diagnosis present

## 2019-05-04 DIAGNOSIS — Z20828 Contact with and (suspected) exposure to other viral communicable diseases: Secondary | ICD-10-CM | POA: Diagnosis present

## 2019-05-04 DIAGNOSIS — F259 Schizoaffective disorder, unspecified: Principal | ICD-10-CM | POA: Diagnosis present

## 2019-05-04 DIAGNOSIS — Z91148 Patient's other noncompliance with medication regimen for other reason: Secondary | ICD-10-CM

## 2019-05-04 DIAGNOSIS — F250 Schizoaffective disorder, bipolar type: Secondary | ICD-10-CM

## 2019-05-04 DIAGNOSIS — Z9119 Patient's noncompliance with other medical treatment and regimen: Secondary | ICD-10-CM | POA: Diagnosis not present

## 2019-05-04 DIAGNOSIS — Z91199 Patient's noncompliance with other medical treatment and regimen due to unspecified reason: Secondary | ICD-10-CM

## 2019-05-04 LAB — CBC WITH DIFFERENTIAL/PLATELET
Abs Immature Granulocytes: 0.03 10*3/uL (ref 0.00–0.07)
Basophils Absolute: 0 10*3/uL (ref 0.0–0.1)
Basophils Relative: 0 %
Eosinophils Absolute: 0 10*3/uL (ref 0.0–0.5)
Eosinophils Relative: 0 %
HCT: 44.3 % (ref 39.0–52.0)
Hemoglobin: 15.1 g/dL (ref 13.0–17.0)
Immature Granulocytes: 0 %
Lymphocytes Relative: 22 %
Lymphs Abs: 1.5 10*3/uL (ref 0.7–4.0)
MCH: 28.2 pg (ref 26.0–34.0)
MCHC: 34.1 g/dL (ref 30.0–36.0)
MCV: 82.6 fL (ref 80.0–100.0)
Monocytes Absolute: 0.6 10*3/uL (ref 0.1–1.0)
Monocytes Relative: 8 %
Neutro Abs: 5 10*3/uL (ref 1.7–7.7)
Neutrophils Relative %: 70 %
Platelets: 324 10*3/uL (ref 150–400)
RBC: 5.36 MIL/uL (ref 4.22–5.81)
RDW: 12.6 % (ref 11.5–15.5)
WBC: 7.2 10*3/uL (ref 4.0–10.5)
nRBC: 0 % (ref 0.0–0.2)

## 2019-05-04 MED ORDER — ALUM & MAG HYDROXIDE-SIMETH 200-200-20 MG/5ML PO SUSP
30.0000 mL | ORAL | Status: DC | PRN
  Filled 2019-05-04: qty 30

## 2019-05-04 MED ORDER — CLOZAPINE 25 MG PO TABS
50.0000 mg | ORAL_TABLET | Freq: Every day | ORAL | Status: DC
  Administered 2019-05-04: 50 mg via ORAL
  Filled 2019-05-04: qty 2

## 2019-05-04 MED ORDER — NICOTINE 21 MG/24HR TD PT24
21.0000 mg | MEDICATED_PATCH | Freq: Every day | TRANSDERMAL | Status: DC
  Administered 2019-05-04 – 2019-05-11 (×7): 21 mg via TRANSDERMAL
  Filled 2019-05-04 (×9): qty 1

## 2019-05-04 MED ORDER — AMLODIPINE BESYLATE 5 MG PO TABS
5.0000 mg | ORAL_TABLET | Freq: Every day | ORAL | Status: DC
  Administered 2019-05-04 – 2019-05-14 (×10): 5 mg via ORAL
  Filled 2019-05-04 (×11): qty 1

## 2019-05-04 MED ORDER — MAGNESIUM HYDROXIDE 400 MG/5ML PO SUSP: 30.0000 mL | Freq: Every day | ORAL | Status: DC | PRN

## 2019-05-04 MED ORDER — ACETAMINOPHEN 325 MG PO TABS: 650.0000 mg | ORAL_TABLET | Freq: Four times a day (QID) | ORAL | Status: DC | PRN

## 2019-05-04 NOTE — ED Notes (Signed)
Pt discharged under IVC to BMU. VS stable. Belongings will be sent with patient. Report given to [place], Therapist, sports. Pt accepting.

## 2019-05-04 NOTE — ED Provider Notes (Signed)
-----------------------------------------   6:11 AM on 05/04/2019 -----------------------------------------   Blood pressure 121/82, pulse (!) 107, temperature 98.8 F (37.1 C), resp. rate 18, height 6\' 6"  (1.981 m), weight 106.1 kg, SpO2 99 %.  The patient is calm and cooperative at this time.  There have been no acute events since the last update.  Awaiting disposition plan from Behavioral Medicine and/or Social Work team(s).   Paulette Blanch, MD 05/04/19 [phone]
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
## 2019-05-04 NOTE — [facility] Group Notes (Signed)
LCSW Group Therapy Note  05/04/2019 1:00 PM  Type of Therapy/Topic:  Group Therapy:  Feelings about Diagnosis  Participation Level:  None   Description of Group:   This group will allow patients to explore their thoughts and feelings about diagnoses they have received. Patients will be guided to explore their level of understanding and acceptance of these diagnoses. Facilitator will encourage patients to process their thoughts and feelings about the reactions of others to their diagnosis and will guide patients in identifying ways to discuss their diagnosis with significant others in their lives. This group will be process-oriented, with patients participating in exploration of their own experiences, giving and receiving support, and processing challenge from other group members.   Therapeutic Goals: 1. Patient will demonstrate understanding of diagnosis as evidenced by identifying two or more symptoms of the disorder 2. Patient will be able to express two feelings regarding the diagnosis 3. Patient will demonstrate their ability to communicate their needs through discussion and/or role play  Summary of Patient Progress: Patient was in and out of group, choosing never to sit down and participate.  At the beginning of the group the patient asked CSW how Santiago Glad and Morey Hummingbird were doing and when CSW responded that she wasn't aware of those individuals the patient left.  Patient did return, and then attempted to engage CSW in a discussion on changing her hairstyle and CSW pointed out that she did not.  Paitnet again left group and returned, this time choosing to stand in the corner just inside of the door to the group room.    Therapeutic Modalities:   Cognitive Behavioral Therapy Brief Therapy Feelings Identification   Assunta Curtis, MSW, LCSW 05/04/2019 12:32 PM

## 2019-05-04 NOTE — ED Notes (Signed)
Hourly rounding reveals patient sleeping in room. No complaints, stable, in no acute distress. Q15 minute rounds and monitoring via Rover and Officer to continue.  

## 2019-05-04 NOTE — Plan of Care (Signed)
New admission.  Problem: Education: Goal: Knowledge of [place] [facility] Education information/materials will improve Outcome: Not Progressing   Problem: Coping: Goal: Ability to verbalize frustrations and anger appropriately will improve Outcome: Not Progressing Goal: Ability to demonstrate self-control will improve Outcome: Not Progressing   Problem: Safety: Goal: Periods of time without injury will increase Outcome: Not Progressing
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
## 2019-05-04 NOTE — Progress Notes (Addendum)
D - Patient was pacing the hallways upon arrival to the unit. Patient was pleasant during assessment denying SI/HI/AVH, pain, anxiety and depression. Patient was observed interacting appropriately with staff and peers. Patient wasn't as isolative has he has been in previous admissions. Patient stated he was feeling good and had a good day.   A - Patient compliant with medication administration per MD orders and procedures on the unit. Patient given education. Patient given support and encouragement to be active in his treatment plan. Patient informed to let staff know if there are any issues or problems on the unit.   R - Patient being monitored Q 15 minutes for safety per unit protocol. Patient remains safe on the unit.   Patient refused VS this am

## 2019-05-04 NOTE — ED Notes (Signed)

## 2019-05-04 NOTE — BH Assessment (Signed)
Patient is to be admitted to [facility] by Dr. Claris Gower. Attending Physician will be Dr. Weber Cooks.   Patient has been assigned to room 304, by [place].   Intake Paper Work has been signed and placed on patient chart.   ER staff is aware of the admission:  Nitchia, ER Secretary    Dr. Cherylann Banas, ER MD   Amy B., Patient's Nurse   Butch Penny, Patient Access.

## 2019-05-04 NOTE — ED Notes (Signed)
Pt. To BHU from ED ambulatory without difficulty, to room  3. Report from [employer] Pt. Is alert and oriented, warm and dry in no distress. Pt. Denies SI, HI, and AVH. Pt. Calm and cooperative. Patient was sleepy and laid down and went to sleep.  Pt. Made aware of security cameras and Q15 minute rounds. Pt. Encouraged to let Nursing staff know of any concerns or needs.

## 2019-05-04 NOTE — ED Notes (Signed)
Meal tray placed in room 

## 2019-05-04 NOTE — Plan of Care (Signed)
Patient newly admitted, hasn't had time to progress  Problem: Education: Goal: Knowledge of [place] [facility] Education information/materials will improve Outcome: Not Progressing   Problem: Coping: Goal: Ability to verbalize frustrations and anger appropriately will improve Outcome: Not Progressing Goal: Ability to demonstrate self-control will improve Outcome: Not Progressing   Problem: Safety: Goal: Periods of time without injury will increase Outcome: Not Progressing   

## 2019-05-04 NOTE — Progress Notes (Signed)
CLOZAPINE MONITORING (reflects NEW REMS GUIDELINES EFFECTIVE 05/02/2014):  Check ANC at least weekly while inpatient.  For general population patients, i.e., those without benign ethnic neutropenia (BEN): --If [place] 1000-1499, increase ANC monitoring to 3x/wk --If ANC < 1000, HOLD CLOZAPINE and get psych consult   For patients with BEN: --If [place], increase ANC monitoring to 3x/wk --If ANC < 500, HOLD CLOZAPINE and get psych consult  REMS-certified psychiatry provider can continue drug with [place] below cited thresholds if they document medical opinion that the neutropenia is not clozapine-induced (heme consult is recommended) or that risk of interrupting therapy is greater than the risk of developing severe neutropenia.  --SEE PRESCRIBING INFORMATION FOR ADDITIONAL DETAILS  10/13 [place] 5000. Patient is eligible to receive clozapine per REMS program. Will check an ANC at least weekly while in-patient.

## 2019-05-05 ENCOUNTER — Ambulatory Visit: Payer: Self-pay | Admitting: Surgery

## 2019-05-05 LAB — HEPATIC FUNCTION PANEL
ALT: 30 U/L (ref 0–44)
AST: 28 U/L (ref 15–41)
Albumin: 4.5 g/dL (ref 3.5–5.0)
Alkaline Phosphatase: 88 U/L (ref 38–126)
Bilirubin, Direct: 0.1 mg/dL (ref 0.0–0.2)
Indirect Bilirubin: 0.8 mg/dL (ref 0.3–0.9)
Total Bilirubin: 0.9 mg/dL (ref 0.3–1.2)
Total Protein: 7.8 g/dL (ref 6.5–8.1)

## 2019-05-05 LAB — LIPID PANEL
Cholesterol: 166 mg/dL (ref 0–200)
HDL: 47 mg/dL (ref >40)
LDL Cholesterol: 96 mg/dL (ref 0–99)
Total CHOL/HDL Ratio: 3.5 RATIO
Triglycerides: 117 mg/dL (ref <150)
VLDL: 23 mg/dL (ref 0–40)

## 2019-05-05 LAB — TSH: TSH: 1.44 u[IU]/mL (ref 0.350–4.500)

## 2019-05-05 MED ORDER — DIPHENHYDRAMINE HCL 50 MG/ML IJ SOLN
50.0000 mg | Freq: Four times a day (QID) | INTRAMUSCULAR | Status: DC | PRN
  Administered 2019-05-05 – 2019-05-07 (×5): 50 mg via INTRAVENOUS
  Filled 2019-05-05 (×5): qty 1

## 2019-05-05 MED ORDER — TEMAZEPAM 15 MG PO CAPS
15.0000 mg | ORAL_CAPSULE | Freq: Every evening | ORAL | Status: DC | PRN
  Administered 2019-05-05 – 2019-05-12 (×7): 15 mg via ORAL
  Filled 2019-05-05 (×8): qty 1

## 2019-05-05 MED ORDER — LORAZEPAM 2 MG/ML IJ SOLN
2.0000 mg | Freq: Four times a day (QID) | INTRAMUSCULAR | Status: DC | PRN
  Administered 2019-05-05 – 2019-05-08 (×7): 2 mg via INTRAMUSCULAR
  Filled 2019-05-05 (×7): qty 1

## 2019-05-05 MED ORDER — CLOZAPINE 25 MG PO TABS
75.0000 mg | ORAL_TABLET | Freq: Every day | ORAL | Status: DC
  Administered 2019-05-05: 75 mg via ORAL
  Filled 2019-05-05: qty 3

## 2019-05-05 MED ORDER — HALOPERIDOL LACTATE 5 MG/ML IJ SOLN
5.0000 mg | Freq: Four times a day (QID) | INTRAMUSCULAR | Status: DC | PRN
  Administered 2019-05-05 – 2019-05-08 (×7): 5 mg via INTRAMUSCULAR
  Filled 2019-05-05 (×7): qty 1

## 2019-05-05 NOTE — BHH Group Notes (Signed)
LCSW Group Therapy Note  05/05/2019 11:01 AM  Type of Therapy/Topic:  Group Therapy:  Emotion Regulation  Participation Level:  None   Description of Group:   The purpose of this group is to assist patients in learning to regulate negative emotions and experience positive emotions. Patients will be guided to discuss ways in which they have been vulnerable to their negative emotions. These vulnerabilities will be juxtaposed with experiences of positive emotions or situations, and patients will be challenged to use positive emotions to combat negative ones. Special emphasis will be placed on coping with negative emotions in conflict situations, and patients will process healthy conflict resolution skills.  Therapeutic Goals: 1. Patient will identify two positive emotions or experiences to reflect on in order to balance out negative emotions 2. Patient will label two or more emotions that they find the most difficult to experience 3. Patient will demonstrate positive conflict resolution skills through discussion and/or role plays  Summary of Patient Progress: Pt came in and out of group about 3 times. Pt did not engage in the group discussion at all.    Therapeutic Modalities:   Cognitive Behavioral Therapy Feelings Identification Dialectical Behavioral Therapy   Evalina Field, MSW, LCSW Clinical Social Work 05/05/2019 11:01 AM

## 2019-05-05 NOTE — Progress Notes (Signed)
Recreation Therapy Notes  INPATIENT RECREATION THERAPY ASSESSMENT  Patient Details Name: [name] [name] MRN: [id number] DOB: 1995-10-22 Today's Date: 05/05/2019       Information Obtained From: Patient  Able to Participate in Assessment/Interview: Yes  Patient Presentation: Responsive  Reason for Admission (Per Patient): Active Symptoms, Med Non-Compliance  Patient Stressors:    Coping Skills:   Sports, Music  Leisure Interests (2+):  Music - Listen, Individual - Reading  Frequency of Recreation/Participation: Monthly  Awareness of Community Resources:     [employer]:     Current Use:    If no, Barriers?:    Expressed Interest in [place] of Residence:  Insurance underwriter  Patient Main Form of Transportation: Walk  Patient Strengths:  a lot  Patient Identified Areas of Improvement:  N/A  Patient Goal for Hospitalization:  To go home  Current SI (including self-harm):  No  Current HI:  No  Current AVH: No  Staff Intervention Plan: Group Attendance, Collaborate with Interdisciplinary Treatment Team  Consent to Intern Participation: N/A  [name] [name] 05/05/2019, 2:30 PM
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
## 2019-05-05 NOTE — Progress Notes (Signed)
Recreation Therapy Notes    Date:05/05/2019  Time: 9:30 am  Location: Craft room  Behavioral response: Inappropriate, disruptive, restless, redirection needed, attention seeking  Intervention Topic: Necessities  Discussion/Intervention:  Group content on today was focused on necessities. The group defined necessities and how they determine their necessities. Individuals expressed how many necessities they have and if it changes from day to day. Patients described the difference between wants and needs. The group explained how they have overspent on wants in the past. Individuals described a reoccurring necessity for them. The intervention "What I need" helped patients differentiate between wants and needs.  Clinical Observations/Feedback:  Patient came to group yelling "It smells bad in here" patient was redirected by writer several times to stay on topic. Individual could not focus on topic and was asked to leave, he refused stating he would be quiet as a mouse he then began to be disruptive by clapping and snapping his fingers over his peers talking. Participant eventually left group and returned dripped with water. Staff explained to patient he had to dry off before coming back to group. He stated he needed to finish listening to the song on the radio before he could go to his room and dry off. Individual eventually left group to dry off and did not return.    [name] [name] LRT/CTRS         [name] [name] 05/05/2019 11:43 AM
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
## 2019-05-05 NOTE — Progress Notes (Signed)
D: Acting  Out Behavior  A: Patient remains sleep at present time  Respirations  Normal  Patient snoring  R: Staff will continue to monitor
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
## 2019-05-05 NOTE — BHH Counselor (Signed)
CSW discussed signing consent for [facility] with patient.  Pt declined to sign the release stating he "was a grown man. No you can not speak to them.  I don't want ou discussing anything with them."  Assunta Curtis, MSW, LCSW 05/05/2019 11:39 AM

## 2019-05-05 NOTE — Plan of Care (Signed)
Patient acting out this shift  unable to follow through with anything  positive .  Problem: Education: Goal: Knowledge of [place] [facility] Education information/materials will improve Outcome: Not Progressing   Problem: Coping: Goal: Ability to verbalize frustrations and anger appropriately will improve Outcome: Not Progressing Goal: Ability to demonstrate self-control will improve Outcome: Not Progressing   Problem: Safety: Goal: Periods of time without injury will increase Outcome: Not Progressing

## 2019-05-06 DIAGNOSIS — Z9119 Patient's noncompliance with other medical treatment and regimen: Secondary | ICD-10-CM

## 2019-05-06 MED ORDER — DIVALPROEX SODIUM 500 MG PO DR TAB
500.0000 mg | DELAYED_RELEASE_TABLET | Freq: Two times a day (BID) | ORAL | Status: DC
  Administered 2019-05-06 – 2019-05-14 (×15): 500 mg via ORAL
  Filled 2019-05-06 (×16): qty 1

## 2019-05-06 MED ORDER — CLOZAPINE 100 MG PO TABS
100.0000 mg | ORAL_TABLET | Freq: Every day | ORAL | Status: DC
  Administered 2019-05-06: 100 mg via ORAL
  Filled 2019-05-06: qty 1

## 2019-05-06 MED ORDER — ZIPRASIDONE MESYLATE 20 MG IM SOLR
20.0000 mg | Freq: Once | INTRAMUSCULAR | Status: AC
  Administered 2019-05-06: 20 mg via INTRAMUSCULAR
  Filled 2019-05-06: qty 20

## 2019-05-06 NOTE — [facility] Suicide Risk Assessment (Signed)
[place] INPATIENT:  Family/Significant Other Suicide Prevention Education  Suicide Prevention Education:  Contact Attempts: Lucile Shutters, sister, [phone] has been identified by the patient as the family member/significant other with whom the patient will be residing, and identified as the person(s) who will aid the patient in the event of a mental health crisis.  With written consent from the patient, two attempts were made to provide suicide prevention education, prior to and/or following the patient's discharge.  We were unsuccessful in providing suicide prevention education.  A suicide education pamphlet was given to the patient to share with family/significant other.  Date and time of first attempt: 05/06/2019 at 3:52pm Date and time of second attempt: Second attempt is needed.  CSW was unable to leave a voicemail as the voicemail box was full.  Rozann Lesches 05/06/2019, 3:52 PM

## 2019-05-06 NOTE — Plan of Care (Signed)
Patient acting out this shift  unable to follow through with anything  positive .    Problem: Education: Goal: Knowledge of [place] [facility] Education information/materials will improve 05/06/2019 1543 by Leodis Liverpool, RN Outcome: Not Progressing 05/06/2019 1542 by Leodis Liverpool, RN Outcome: Progressing   Problem: Coping: Goal: Ability to verbalize frustrations and anger appropriately will improve 05/06/2019 1543 by Leodis Liverpool, RN Outcome: Not Progressing 05/06/2019 1542 by Leodis Liverpool, RN Outcome: Progressing Goal: Ability to demonstrate self-control will improve 05/06/2019 1543 by Leodis Liverpool, RN Outcome: Not Progressing 05/06/2019 1542 by Leodis Liverpool, RN Outcome: Progressing   Problem: Safety: Goal: Periods of time without injury will increase 05/06/2019 1543 by Leodis Liverpool, RN Outcome: Not Progressing 05/06/2019 1542 by Leodis Liverpool, RN Outcome: Progressing

## 2019-05-06 NOTE — Progress Notes (Signed)
D: Acting  Out Behavior   A:Patient stated slept good last night .Stated appetite good and energy level   normal. Stated concentration is good . Senies suicidal  homicidal ideations  . Denies  auditory hallucinations  No pain concerns . Appropriate ADL'S. Interacting with peers and staff. Patient acting out this shift  unable to follow through with anything  positive .   Encourage patient participation with unit programming . Instruction  Given on  Medication , verbalize understanding.  R: Voice no other concerns. Staff continue to monitor

## 2019-05-06 NOTE — Tx Team (Signed)
Interdisciplinary Treatment and Diagnostic Plan Update  05/06/2019 Time of Session: 900am [name] [name] MRN: [id number]  Principal Diagnosis: Schizoaffective disorder [facility])  Secondary Diagnoses: Principal Problem:   Schizoaffective disorder ([place]) Active Problems:   Noncompliance   Hypertension   Current Medications:  Current Facility-Administered Medications  Medication Dose Route Frequency Provider Last Rate Last Dose  . acetaminophen (TYLENOL) tablet 650 mg  650 mg Oral Q6H PRN Cristofano, Dorene Ar, MD      . alum & mag hydroxide-simeth (MAALOX/MYLANTA) 200-200-20 MG/5ML suspension 30 mL  30 mL Oral Q4H PRN Cristofano, Paul A, MD      . amLODipine (NORVASC) tablet 5 mg  5 mg Oral Daily Cristofano, Dorene Ar, MD   5 mg at 05/06/19 0806  . cloZAPine (CLOZARIL) tablet 75 mg  75 mg Oral QHS [name], Madie Reno, MD   75 mg at 05/05/19 2111  . diphenhydrAMINE (BENADRYL) injection 50 mg  50 mg Intravenous Q6H PRN [name], [name], MD   50 mg at 05/05/19 1413  . haloperidol lactate (HALDOL) injection 5 mg  5 mg Intramuscular Q6H PRN [name], Madie Reno, MD   5 mg at 05/05/19 2113  . LORazepam (ATIVAN) injection 2 mg  2 mg Intramuscular Q6H PRN [name], Madie Reno, MD   2 mg at 05/05/19 2113  . magnesium hydroxide (MILK OF MAGNESIA) suspension 30 mL  30 mL Oral Daily PRN Cristofano, Paul A, MD      . nicotine (NICODERM CQ - dosed in mg/24 hours) patch 21 mg  21 mg Transdermal Daily [name], Madie Reno, MD   21 mg at 05/06/19 0809  . temazepam (RESTORIL) capsule 15 mg  15 mg Oral QHS PRN [name], Madie Reno, MD   15 mg at 05/05/19 2112   PTA Medications: Medications Prior to Admission  Medication Sig Dispense Refill Last Dose  . amLODipine (NORVASC) 5 MG tablet Take 1 tablet (5 mg total) by mouth daily. 30 tablet 1   . cloZAPine (CLOZARIL) 100 MG tablet Take 2.5 tablets (250 mg total) by mouth at bedtime. 75 tablet 1   . divalproex (DEPAKOTE) 500 MG DR tablet Take 1 tablet (500 mg total) by mouth every 12  (twelve) hours. 60 tablet 1   . fluvoxaMINE (LUVOX) 25 MG tablet Take 1 tablet (25 mg total) by mouth at bedtime. 30 tablet 1   . paliperidone (INVEGA SUSTENNA) 156 MG/ML SUSY injection Inject 1 mL (156 mg total) into the muscle every 28 (twenty-eight) days. 1.2 mL 1     Patient Stressors: Marital or family conflict Medication change or noncompliance  Patient Strengths: Curator fund of knowledge  Treatment Modalities: Medication Management, Group therapy, Case management,  1 to 1 session with clinician, Psychoeducation, Recreational therapy.   Physician Treatment Plan for Primary Diagnosis: Schizoaffective disorder ([name]) Long Term Goal(s): Improvement in symptoms so as ready for discharge Improvement in symptoms so as ready for discharge   Short Term Goals: Ability to demonstrate self-control will improve Ability to identify and develop effective coping behaviors will improve Compliance with prescribed medications will improve  Medication Management: Evaluate patient's response, side effects, and tolerance of medication regimen.  Therapeutic Interventions: 1 to 1 sessions, Unit Group sessions and Medication administration.  Evaluation of Outcomes: Not Met  Physician Treatment Plan for Secondary Diagnosis: Principal Problem:   Schizoaffective disorder ([name]) Active Problems:   Noncompliance   Hypertension  Long Term Goal(s): Improvement in symptoms so as ready for discharge Improvement in symptoms so as ready  for discharge   Short Term Goals: Ability to demonstrate self-control will improve Ability to identify and develop effective coping behaviors will improve Compliance with prescribed medications will improve     Medication Management: Evaluate patient's response, side effects, and tolerance of medication regimen.  Therapeutic Interventions: 1 to 1 sessions, Unit Group sessions and Medication administration.  Evaluation of Outcomes: Not Met   RN  Treatment Plan for Primary Diagnosis: Schizoaffective disorder ([name]) Long Term Goal(s): Knowledge of disease and therapeutic regimen to maintain health will improve  Short Term Goals: Ability to remain free from injury will improve, Ability to demonstrate self-control, Ability to participate in decision making will improve, Ability to identify and develop effective coping behaviors will improve and Compliance with prescribed medications will improve  Medication Management: RN will administer medications as ordered by provider, will assess and evaluate patient's response and provide education to patient for prescribed medication. RN will report any adverse and/or side effects to prescribing provider.  Therapeutic Interventions: 1 on 1 counseling sessions, Psychoeducation, Medication administration, Evaluate responses to treatment, Monitor vital signs and CBGs as ordered, Perform/monitor CIWA, COWS, AIMS and Fall Risk screenings as ordered, Perform wound care treatments as ordered.  Evaluation of Outcomes: Not Met   LCSW Treatment Plan for Primary Diagnosis: Schizoaffective disorder ([name]) Long Term Goal(s): Safe transition to appropriate next level of care at discharge, Engage patient in therapeutic group addressing interpersonal concerns.  Short Term Goals: Engage patient in aftercare planning with referrals and resources, Increase emotional regulation, Facilitate acceptance of mental health diagnosis and concerns, Identify triggers associated with mental health/substance abuse issues and Increase skills for wellness and recovery  Therapeutic Interventions: Assess for all discharge needs, 1 to 1 time with Social worker, Explore available resources and support systems, Assess for adequacy in community support network, Educate family and significant other(s) on suicide prevention, Complete Psychosocial Assessment, Interpersonal group therapy.  Evaluation of Outcomes: Not Met   Progress in  Treatment: Attending groups: Yes. Participating in groups: No. Taking medication as prescribed: Yes. Toleration medication: Yes. Family/Significant other contact made: No, will contact:  pts sister Patient understands diagnosis: No. Discussing patient identified problems/goals with staff: No. Medical problems stabilized or resolved: Yes. Denies suicidal/homicidal ideation: Yes. Issues/concerns per patient self-inventory: No. Other: N/A  New problem(s) identified: No, Describe:  none  New Short Term/Long Term Goal(s): Detox, elimination of AVH/symptoms of psychosis, medication management for mood stabilization; elimination of SI thoughts; development of comprehensive mental wellness/sobriety plan.   Patient Goals:  Pt refused to attend treatment team  Discharge Plan or Barriers: SPE pamphlet, Mobile Crisis information, and AA/NA information provided to patient for additional community support and resources. CSW assessing for appropriate referrals. Pt refused to sign ROI for his ACTT team at this time.  Reason for Continuation of Hospitalization: Aggression Hallucinations Mania Medication stabilization  Estimated Length of Stay: TBD  Attendees: Patient: 05/06/2019 10:29 AM  Physician: Dr Weber Cooks MD 05/06/2019 10:29 AM  Nursing: Polly Cobia RN 05/06/2019 10:29 AM  Case Manager: Rockne Menghini 05/06/2019 10:29 AM  Social Worker: Minette Brine Orlene Salmons LCSW 05/06/2019 10:29 AM  Recreational Therapist: Roanna Epley CTRS LRT 05/06/2019 10:29 AM  Other: Assunta Curtis LCSW 05/06/2019 10:29 AM  Other: Sanjuana Kava LCSW 05/06/2019 10:29 AM  Other: 05/06/2019 10:29 AM    Scribe for Treatment Team: Mariann Laster Shakir Petrosino, LCSW 05/06/2019 10:29 AM

## 2019-05-06 NOTE — Progress Notes (Signed)
Recreation Therapy Notes   Date: 05/06/2019  Time: 9:30 am   Location: Craft room   Behavioral response: N/A   Intervention Topic: Goals   Discussion/Intervention: Patient did not attend group.   Clinical Observations/Feedback:  Patient did not attend group.   Camil Hausmann LRT/CTRS        Jessyca Sloan 05/06/2019 11:08 AM

## 2019-05-06 NOTE — [facility] Group Notes (Signed)
Balance In Life 05/06/2019 1PM  Type of Therapy/Topic:  Group Therapy:  Balance in Life  Participation Level:  Did Not Attend  Description of Group:   This group will address the concept of balance and how it feels and looks when one is unbalanced. Patients will be encouraged to process areas in their lives that are out of balance and identify reasons for remaining unbalanced. Facilitators will guide patients in utilizing problem-solving interventions to address and correct the stressor making their life unbalanced. Understanding and applying boundaries will be explored and addressed for obtaining and maintaining a balanced life. Patients will be encouraged to explore ways to assertively make their unbalanced needs known to significant others in their lives, using other group members and facilitator for support and feedback.  Therapeutic Goals: 1. Patient will identify two or more emotions or situations they have that consume much of in their lives. 2. Patient will identify signs/triggers that life has become out of balance:  3. Patient will identify two ways to set boundaries in order to achieve balance in their lives:  4. Patient will demonstrate ability to communicate their needs through discussion and/or role plays  Summary of Patient Progress:    Therapeutic Modalities:   Cognitive Behavioral Therapy Solution-Focused Therapy Assertiveness Training  Ruthanne Mcneish T [place] Day, LCSW  
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
## 2019-05-06 NOTE — Plan of Care (Signed)
D: Pt denies SI/HI. Patient states he is having auditory hallucinations and appears preoccupied at times and to be responding to internal stimuli. A: Pt was offered support and encouragement. Pt was given scheduled medications.  Q 15 minute checks were done for safety.  R: Pt is taking medication. Pt has no complaints.Pt receptive to treatment and safety maintained on unit.    Problem: Education: Goal: Knowledge of [place] [facility] Education information/materials will improve Outcome: Progressing   Problem: Coping: Goal: Ability to demonstrate self-control will improve Outcome: Progressing   Problem: Safety: Goal: Periods of time without injury will increase Outcome: Progressing   Problem: Coping: Goal: Ability to verbalize frustrations and anger appropriately will improve Outcome: Not Progressing
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
## 2019-05-07 MED ORDER — CLOZAPINE 25 MG PO TABS
150.0000 mg | ORAL_TABLET | Freq: Every day | ORAL | Status: DC
  Administered 2019-05-07 – 2019-05-10 (×4): 150 mg via ORAL
  Filled 2019-05-07 (×4): qty 1

## 2019-05-07 NOTE — Progress Notes (Signed)
Recreation Therapy Notes  Date: 05/07/2019  Time: 9:30 am   Location: Craft room   Behavioral response: N/A   Intervention Topic: Communication  Discussion/Intervention: Patient did not attend group.   Clinical Observations/Feedback:  Patient did not attend group.   Allissa Albright LRT/CTRS        Felcia Huebert 05/07/2019 11:02 AM 
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
## 2019-05-07 NOTE — Progress Notes (Signed)
17:00 patient is resting with eyes closed.  18:00 Patient is resting with eyes closed.
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
## 2019-05-07 NOTE — [facility] Group Notes (Signed)
LCSW Group Therapy Note  05/07/2019 11:59 AM  Type of Therapy and Topic:  Group Therapy:  Feelings around Relapse and Recovery  Participation Level:  None   Description of Group:    Patients in this group will discuss emotions they experience before and after a relapse. They will process how experiencing these feelings, or avoidance of experiencing them, relates to having a relapse. Facilitator will guide patients to explore emotions they have related to recovery. Patients will be encouraged to process which emotions are more powerful. They will be guided to discuss the emotional reaction significant others in their lives may have to their relapse or recovery. Patients will be assisted in exploring ways to respond to the emotions of others without this contributing to a relapse.  Therapeutic Goals: 1. Patient will identify two or more emotions that lead to a relapse for them 2. Patient will identify two emotions that result when they relapse 3. Patient will identify two emotions related to recovery 4. Patient will demonstrate ability to communicate their needs through discussion and/or role plays   Summary of Patient Progress: Pt was present in group and was intrusive throughout the whole session. Pt was asking inappropriate questions such as "where did you get those tights" to this Probation officer and was also intrusive on nursing students in attendance, asking one for her phone number. Pt was asked to have a seat or leave group numerous times in which pt disregarded. Pt also triggered one group member to leave and started yelling with another group member due to pt staring at the other pt asking if he had a problem. Security deescalated pt.     Therapeutic Modalities:   Cognitive Behavioral Therapy Solution-Focused Therapy Assertiveness Training Relapse Prevention Therapy   Evalina Field, MSW, LCSW Clinical Social Work 05/07/2019 11:59 AM

## 2019-05-07 NOTE — Progress Notes (Signed)
The patient was pacing on the unit around midnight, knocking on other patient's rooms. Multiple knocks at the nurses station windows and doors. Patient was given a snack upon request. The patient became increasingly more aggressive at which time medication were administered.  Bendadryl 50mg , Haldol 5mg  (right deltoid), and Ativan 2mg  IM (left deltoid).   Ivonne Andrew, RN

## 2019-05-07 NOTE — [facility] Suicide Risk Assessment (Signed)
[place] INPATIENT:  Family/Significant Other Suicide Prevention Education  Suicide Prevention Education:  Contact Attempts: Lucile Shutters, sister, [phone] has been identified by the patient as the family member/significant other with whom the patient will be residing, and identified as the person(s) who will aid the patient in the event of a mental health crisis.  With written consent from the patient, two attempts were made to provide suicide prevention education, prior to and/or following the patient's discharge.  We were unsuccessful in providing suicide prevention education.  A suicide education pamphlet was given to the patient to share with family/significant other.  Date and time of first attempt: 05/06/2019 at 3:52pm Date and time of second attempt: 05/07/2019 at 2:17PM  CSW was unable to leave a voicemail as the voicemail box was full.  [place] MSW LCSW 05/07/2019, 2:17 PM

## 2019-05-07 NOTE — Progress Notes (Signed)
CSW spoke with pt regarding discharge plans and signing ROI for his ACTT team with [facility]. Pt declined to sign stating he did not want team speaking with his ACTT team.   Evalina Field, MSW, LCSW Clinical Social Work 05/07/2019 2:14 PM

## 2019-05-08 NOTE — Progress Notes (Signed)
Patient is again reminded that medication is available at this time. Patient remains in bed.
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
## 2019-05-08 NOTE — Progress Notes (Signed)
[facility] MD Progress Note  05/08/2019 11:48 AM [name] [name]  MRN:  [id number] Subjective: Patient is a 23 year old male with a longstanding history of schizophrenia who was admitted again on 05/04/2019 secondary to aggressive behaviors in the home and neighborhood, and noncompliance with medications.  Objective: Patient is seen and examined.  Patient is a 23 year old male with the above-stated past psychiatric history who is seen in follow-up.  Is familiar to me from previous admissions.  He is essentially unchanged from previous admissions.  During the interview he always remains in bed, and barely puts his head out to speak.  He denied any suicidal or homicidal ideation.  He denied any auditory or visual hallucinations.  Review of the nursing notes show that he did not get out of bed to take his medications this a.m.  This was discussed.  His current medications include Clozaril 150 mg p.o. nightly, Depakote DR 500 mg p.o. twice daily, temazepam 15 mg p.o. nightly as needed, and amlodipine 5 mg p.o. daily.  Review of his admission laboratories showed a mildly elevated glucose, but otherwise normal electrolytes and CBC.  His last vital signs were from 10/15.  His blood pressure was stable at that time.  Nursing notes reflect that he slept 7.75 hours last night.  Principal Problem: Schizoaffective disorder ([place]) Diagnosis: Principal Problem:   Schizoaffective disorder ([place]) Active Problems:   Noncompliance   Hypertension  Total Time spent with patient: 20 minutes  Past Psychiatric History: See admission H&P  Past Medical History:  Past Medical History:  Diagnosis Date  . Asthma   . Depression   . Psychosis ([place])   . Schizoaffective disorder [facility])     Past Surgical History:  Procedure Laterality Date  . BACK SURGERY    . I&D groin  2017   Family History: History reviewed. No pertinent family history. Family Psychiatric  History: See admission H&P Social History:  Social History    Substance and Sexual Activity  Alcohol Use No     Social History   Substance and Sexual Activity  Drug Use Never    Social History   Socioeconomic History  . Marital status: Single    Spouse name: Not on file  . Number of children: Not on file  . Years of education: Not on file  . Highest education level: Not on file  Occupational History  . Not on file  Social Needs  . Financial resource strain: Not on file  . Food insecurity    Worry: Not on file    Inability: Not on file  . Transportation needs    Medical: Not on file    Non-medical: Not on file  Tobacco Use  . Smoking status: Never Smoker  . Smokeless tobacco: Never Used  Substance and Sexual Activity  . Alcohol use: No  . Drug use: Never  . Sexual activity: Not Currently  Lifestyle  . Physical activity    Days per week: Not on file    Minutes per session: Not on file  . Stress: Not on file  Relationships  . Social Herbalist on phone: Not on file    Gets together: Not on file    Attends religious service: Not on file    Active member of club or organization: Not on file    Attends meetings of clubs or organizations: Not on file    Relationship status: Not on file  Other Topics Concern  . Not on file  Social History  Narrative  . Not on file   Additional Social History:                         Sleep: Good  Appetite:  Fair  Current Medications: Current Facility-Administered Medications  Medication Dose Route Frequency Provider Last Rate Last Dose  . acetaminophen (TYLENOL) tablet 650 mg  650 mg Oral Q6H PRN Cristofano, Worthy Rancher, MD      . alum & mag hydroxide-simeth (MAALOX/MYLANTA) 200-200-20 MG/5ML suspension 30 mL  30 mL Oral Q4H PRN Cristofano, Paul A, MD      . amLODipine (NORVASC) tablet 5 mg  5 mg Oral Daily Cristofano, Worthy Rancher, MD   5 mg at 05/06/19 0806  . cloZAPine (CLOZARIL) tablet 150 mg  150 mg Oral QHS [name], [name], MD   150 mg at 05/07/19 2118  . diphenhydrAMINE  (BENADRYL) injection 50 mg  50 mg Intravenous Q6H PRN [name], [name], MD   50 mg at 05/07/19 1502  . divalproex (DEPAKOTE) DR tablet 500 mg  500 mg Oral Q12H Money, Feliz Beam B, FNP   500 mg at 05/07/19 2119  . haloperidol lactate (HALDOL) injection 5 mg  5 mg Intramuscular Q6H PRN [name], [name], MD   5 mg at 05/07/19 1503  . LORazepam (ATIVAN) injection 2 mg  2 mg Intramuscular Q6H PRN [name], [name], MD   2 mg at 05/07/19 1504  . magnesium hydroxide (MILK OF MAGNESIA) suspension 30 mL  30 mL Oral Daily PRN Cristofano, Paul A, MD      . nicotine (NICODERM CQ - dosed in mg/24 hours) patch 21 mg  21 mg Transdermal Daily [name], [name], MD   21 mg at 05/07/19 0802  . temazepam (RESTORIL) capsule 15 mg  15 mg Oral QHS PRN [name], [name], MD   15 mg at 05/07/19 2120    Lab Results: No results found for this or any previous visit (from the past 48 hour(s)).  Blood Alcohol level:  Lab Results  Component Value Date   ETH <10 05/03/2019   ETH <10 01/01/2019    Metabolic Disorder Labs: Lab Results  Component Value Date   HGBA1C 5.1 08/10/2018   MPG 99.67 08/10/2018   MPG 93.93 03/08/2018   No results found for: PROLACTIN Lab Results  Component Value Date   CHOL 166 05/05/2019   TRIG 117 05/05/2019   HDL 47 05/05/2019   CHOLHDL 3.5 05/05/2019   VLDL 23 05/05/2019   LDLCALC 96 05/05/2019   LDLCALC 108 (H) 08/10/2018    Physical Findings: AIMS:  , ,  ,  ,    CIWA:    COWS:     Musculoskeletal: Strength & Muscle Tone: within normal limits Gait & Station: normal Patient leans: N/A  Psychiatric Specialty Exam: Physical Exam  Nursing note and vitals reviewed. Constitutional: He appears well-developed and well-nourished.  HENT:  Head: Normocephalic and atraumatic.  Respiratory: Effort normal.  Neurological: He is alert.    ROS  Blood pressure (!) 133/92, pulse 78, temperature 97.9 F (36.6 C), temperature source Oral, resp. rate 18, height 6\' 1"  (1.854 m), weight 106.6 kg,  SpO2 98 %.Body mass index is 31 kg/m.  General Appearance: Casual  Eye Contact:  Fair  Speech:  Normal Rate  Volume:  Decreased  Mood:  Dysphoric  Affect:  Congruent  Thought Process:  Goal Directed and Descriptions of Associations: Circumstantial  Orientation:  Full (Time, Place, and Person)  Thought Content:  Negative  Suicidal Thoughts:  No  Homicidal Thoughts:  No  Memory:  Immediate;   Poor Recent;   Poor Remote;   Poor  Judgement:  Impaired  Insight:  Lacking  Psychomotor Activity:  Decreased  Concentration:  Concentration: Fair and Attention Span: Fair  Recall:  [employer]Fair  Fund of Knowledge:  Fair  Language:  Fair  Akathisia:  Negative  Handed:  Right  AIMS (if indicated):     Assets:  Desire for Improvement Resilience  ADL's:  Impaired  Cognition:  WNL  Sleep:  Number of Hours: 7.75     Treatment Plan Summary: Daily contact with patient to assess and evaluate symptoms and progress in treatment, Medication management and Plan : Patient is seen and examined.  Patient is a 23 year old male with the above-stated past psychiatric history who is seen in follow-up.   Diagnosis: #1 schizoaffective disorder versus schizophrenia.  #2 hypertension  Patient is seen in follow-up.  Patient is familiar to me from previous admissions.  He is essentially unchanged from previous admissions.  He continues on Clozaril and Depakote.  He has a long history of noncompliance.  No change in his current medications.  I seriously doubt his insight towards his illness will ever change. 1.  Continue amlodipine 5 mg p.o. daily for hypertension. 2.  Continue Clozaril 150 mg p.o. nightly for psychosis and mood stability. 3.  Continue Depakote DR 500 mg p.o. every 12 hours for mood stability. 4.  Continue temazepam 15 mg p.o. nightly as needed insomnia. 5.  Disposition planning-in progress.  Antonieta PertGreg Lawson Analyce Tavares, MD 05/08/2019, 11:48 AM

## 2019-05-08 NOTE — Plan of Care (Addendum)
Patient has been verbally aggressive, threatening to harm staff and peers. Using inappropriate , sexual language. Disorganized and delusional, thinking that he has super power. Currently placed on 1:1. MD was paged for updates.

## 2019-05-08 NOTE — [facility] Group Notes (Signed)
[place] LCSW Group Therapy Note  Date/Time: 05/08/2019 @ 1:00pm  Type of Therapy/Topic:  Group Therapy:  Feelings about Diagnosis  Participation Level:  None   Mood: Pleasant   Description of Group:    This group will allow patients to explore their thoughts and feelings about diagnoses they have received. Patients will be guided to explore their level of understanding and acceptance of these diagnoses. Facilitator will encourage patients to process their thoughts and feelings about the reactions of others to their diagnosis, and will guide patients in identifying ways to discuss their diagnosis with significant others in their lives. This group will be process-oriented, with patients participating in exploration of their own experiences as well as giving and receiving support and challenge from other group members.   Therapeutic Goals: 1. Patient will demonstrate understanding of diagnosis as evidence by identifying two or more symptoms of the disorder:  2. Patient will be able to express two feelings regarding the diagnosis 3. Patient will demonstrate ability to communicate their needs through discussion and/or role plays  Summary of Patient Progress:    Patient attended the first 10 minutes of group before leaving and not returning. Patient was respectful and engaged while in group.     Therapeutic Modalities:   Cognitive Behavioral Therapy Brief Therapy Feelings Identification   Ardelle Anton, LCSW
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
## 2019-05-08 NOTE — Progress Notes (Signed)
Patient has just fallen asleep. No sign of distress while sleeping.

## 2019-05-08 NOTE — Progress Notes (Signed)
Care takened over at 0100. Patient slept well through out the night. No issues to report on shift. 

## 2019-05-08 NOTE — Progress Notes (Signed)
Patient ID: [name] [name], male   DOB: January 30, 1996, 23 y.o.   MRN: [id number]  CSW attempted to call pt's brother, Sammy for SPE.  Pt's mother called CSW and asked who called the number. CSW asked for pt's brother, Sammy.  Pt's mother stated that Campbell Lerner is a minor and is only 23 years old and she does not want anyone from the hospital talking to her minor child about the patient. CSW apologized for the confusion and informed pt's mother that CSW does not have consent to talk to her about the patient.  Pt's mother stated that when the patient was at the hospital at [facility] that they allowed the social workers, nurses, and doctors to call her without permission because "they care that the family is included". CSW explained the [facility] policy. Pt's mother stated that [facility] has done nothing but "cause her pain". She stated that the patient is not in his right mind to make these kind of decisions and that he lives at home with his parents who know his care and treatment. CSW offered to ask patient if he would agree to let CSW talk with his mother. Pt's mother states that he will most likely say no because of the state of his mind.   Pt's mother talked about not wanting him to be on the [place] shot and stated that she wants someone to keep her updated. CSW explained that social work cannot speak to her without consent from the patient. CSW informed mother that CSW will go ask the patient if he gives permission. If so, then CSW will call the mother back. If not, then CSW will not be able to talk with the mother.   Pt's mother stated that she was upset but understands the protocol even though other hospitals do things different.
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
## 2019-05-08 NOTE — Progress Notes (Signed)
PT is made aware of available medication. Patient raises head from bed to look at nurse and is nonverbal and then returns to bed.

## 2019-05-09 MED ORDER — CLOZAPINE 25 MG PO TABS
50.0000 mg | ORAL_TABLET | Freq: Every day | ORAL | Status: DC
  Administered 2019-05-09 – 2019-05-14 (×6): 50 mg via ORAL
  Filled 2019-05-09 (×6): qty 2
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
## 2019-05-09 NOTE — Progress Notes (Signed)
Patient is asleep, this writer will administer morning medication once he wakes up. This Probation officer will notify MD.

## 2019-05-09 NOTE — Progress Notes (Signed)
Patient has been asleep after getting up and taking his morning medication. This Probation officer will administer newly ordered medication once patient awakes.

## 2019-05-09 NOTE — [facility] Group Notes (Signed)
LCSW Group Therapy Note 05/09/2019 1:15pm  Type of Therapy and Topic: Group Therapy: Feelings Around Returning Home & Establishing a Supportive Framework and Supporting Oneself When Supports Not Available  Participation Level: Did Not Attend  Description of Group:  Patients first processed thoughts and feelings about upcoming discharge. These included fears of upcoming changes, lack of change, new living environments, judgements and expectations from others and overall stigma of mental health issues. The group then discussed the definition of a supportive framework, what that looks and feels like, and how do to discern it from an unhealthy non-supportive network. The group identified different types of supports as well as what to do when your family/friends are less than helpful or unavailable  Therapeutic Goals  1. Patient will identify one healthy supportive network that they can use at discharge. 2. Patient will identify one factor of a supportive framework and how to tell it from an unhealthy network. 3. Patient able to identify one coping skill to use when they do not have positive supports from others. 4. Patient will demonstrate ability to communicate their needs through discussion and/or role plays.  Summary of Patient Progress:  Pt was invited to attend group but chose not to attend. CSW will continue to encourage pt to attend group throughout their admission.   Therapeutic Modalities Cognitive Behavioral Therapy Motivational Interviewing   Ann Bohne  CUEBAS-COLON, LCSW 05/09/2019 12:11 PM

## 2019-05-09 NOTE — Progress Notes (Signed)
Care takened over at 2300. 1:1 sitter remains at bedside for safety. Patient slept well through out the night. No issues to report on shift at this time.Marland Kitchen

## 2019-05-09 NOTE — Plan of Care (Signed)
D- Patient alert and oriented. Patient presents in religiously preoccupied/hypersexual mood at this time. Patient has been talking with this Probation officer about the bible and asking this Probation officer "if I ate the apple, would you eat it". Patient also stated that he sees a figure on the side of the computer station that this writer is using stating "look, that's Ham, the son of" someone that he said is in the bible. Patient also stated that the figure is naked and "that's his penis". Patient kept talking to this writer, going on a tangent, stating that his father is going to bring him a Christmas present. Patient also stated "I'm Schizophrenic, don't mind me". Patient denied any signs/symptoms of depression/anxiety, stating that he's "feeling well". Patient also denied SI, HI, AVH, and pain at this time. Patient had no stated goals for today.  A- Scheduled medications administered to patient, per MD orders. Support and encouragement provided.  Routine safety checks conducted every 15 minutes.  Patient informed to notify staff with problems or concerns.  R- No adverse drug reactions noted. Patient contracts for safety at this time. Patient compliant with medications. Patient receptive, calm, and cooperative. Patient remains safe at this time.  Problem: Education: Goal: Knowledge of [facility] Education information/materials will improve Outcome: Not Progressing   Problem: Coping: Goal: Ability to verbalize frustrations and anger appropriately will improve Outcome: Not Progressing Goal: Ability to demonstrate self-control will improve Outcome: Not Progressing   Problem: Safety: Goal: Periods of time without injury will increase Outcome: Not Progressing

## 2019-05-09 NOTE — Progress Notes (Signed)
1:1 Patient Hourly Rounding:  1000: Patient is asleep, in his room, with his assigned safety sitter present at bedside.  1400: Patient is in the back dayroom, talking with his assigned Air cabin crew.  1800: Patient is in his room, listening to music, with his assigned safety sitter present.

## 2019-05-10 LAB — CBC WITH DIFFERENTIAL/PLATELET
Abs Immature Granulocytes: 0.02 10*3/uL (ref 0.00–0.07)
Basophils Absolute: 0 10*3/uL (ref 0.0–0.1)
Basophils Relative: 0 %
Eosinophils Absolute: 0 10*3/uL (ref 0.0–0.5)
Eosinophils Relative: 0 %
HCT: 43.7 % (ref 39.0–52.0)
Hemoglobin: 15 g/dL (ref 13.0–17.0)
Immature Granulocytes: 0 %
Lymphocytes Relative: 34 %
Lymphs Abs: 2.1 10*3/uL (ref 0.7–4.0)
MCH: 27.8 pg (ref 26.0–34.0)
MCHC: 34.3 g/dL (ref 30.0–36.0)
MCV: 80.9 fL (ref 80.0–100.0)
Monocytes Absolute: 0.6 10*3/uL (ref 0.1–1.0)
Monocytes Relative: 10 %
Neutro Abs: 3.5 10*3/uL (ref 1.7–7.7)
Neutrophils Relative %: 56 %
Platelets: 334 10*3/uL (ref 150–400)
RBC: 5.4 MIL/uL (ref 4.22–5.81)
RDW: 12.3 % (ref 11.5–15.5)
WBC: 6.3 10*3/uL (ref 4.0–10.5)
nRBC: 0 % (ref 0.0–0.2)

## 2019-05-10 LAB — HEPATIC FUNCTION PANEL
ALT: 37 U/L (ref 0–44)
AST: 35 U/L (ref 15–41)
Albumin: 4 g/dL (ref 3.5–5.0)
Alkaline Phosphatase: 98 U/L (ref 38–126)
Bilirubin, Direct: <0.1 mg/dL (ref 0.0–0.2)
Total Bilirubin: 0.8 mg/dL (ref 0.3–1.2)
Total Protein: 7.1 g/dL (ref 6.5–8.1)

## 2019-05-10 LAB — VALPROIC ACID LEVEL: Valproic Acid Lvl: 76 ug/mL (ref 50.0–100.0)

## 2019-05-10 MED ORDER — PALIPERIDONE PALMITATE ER 234 MG/1.5ML IM SUSY
234.0000 mg | PREFILLED_SYRINGE | INTRAMUSCULAR | Status: DC
  Administered 2019-05-14: 234 mg via INTRAMUSCULAR
  Filled 2019-05-10: qty 1.5
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
## 2019-05-10 NOTE — Progress Notes (Signed)
1:1 Patient Hourly Rounding:  1000: Patient is in bed, awake, with his assigned safety sitter present at bedside.  1400: Patient is in his room, with his assigned safety sitter present at his bedside.  1800: Patient is in his room, asleep, with his assigned safety sitter present at his bedside.

## 2019-05-10 NOTE — Progress Notes (Signed)
Recreation Therapy Notes  Date: 05/10/2019  Time: 9:30 am   Location: Craft room   Behavioral response: N/A   Intervention Topic: Relaxation   Discussion/Intervention: Patient did not attend group.   Clinical Observations/Feedback:  Patient did not attend group.   Shaine Newmark LRT/CTRS        [name] [name] 05/10/2019 12:13 PM 

## 2019-05-10 NOTE — Progress Notes (Signed)
CLOZAPINE MONITORING  Check ANC at least weekly while inpatient.  For general population patients, i.e., those without benign ethnic neutropenia (BEN): --If [place] 1000-1499, increase ANC monitoring to 3x/wk --If ANC < 1000, HOLD CLOZAPINE and get psych consult  For patients with BEN: --If [place], increase ANC monitoring to 3x/wk --If ANC < 500, HOLD CLOZAPINE and get psych consult  REMS-certified psychiatry provider can continue drug with [place] below cited thresholds if they document medical opinion that the neutropenia is not clozapine-induced (heme consult is recommended) or that risk of interrupting therapy is greater than the risk of developing severe neutropenia.  --SEE PRESCRIBING INFORMATION FOR ADDITIONAL DETAILS  10/13 [place] 5000. Patient is eligible to receive clozapine per REMS program. Will check an ANC at least weekly while in-patient.   PLAN: 10/19 ANC: 3500. Lab submitted to online REMs program. Patient is eligible to receive clozapine with every 4 week monitoring.   Will continue to monitor weekly while inpatient per protocol.   Next CBC/Diff 10/26  Pernell Dupre, PharmD, BCPS Clinical Pharmacist 05/10/2019 7:38 AM

## 2019-05-10 NOTE — Plan of Care (Signed)
  Problem: Coping: Goal: Ability to verbalize frustrations and anger appropriately will improve Outcome: Not Progressing Goal: Ability to demonstrate self-control will improve Outcome: Not Progressing  D: Patient has been less agitated today. Resting in bed a lot. Quieter. Forwarding very little in conversation. Denies SI and HI. Appears internally preoccupied. A: Continue 1:1 for safety R: safety maintained.

## 2019-05-10 NOTE — Progress Notes (Signed)
[name] [name], MHT informed this Probation officer that patient did not eat his breakfast, although he went to the dayroom and looked at his tray. This Probation officer was informed that patient also did not touch his lunch, he has been in bed after he got up to take his morning medication.

## 2019-05-10 NOTE — Plan of Care (Signed)
Patient pleasant with this writer during assessment and medication administration. Patient observed interacting appropriately with staff and peers this evening.   Problem: Coping: Goal: Ability to verbalize frustrations and anger appropriately will improve Outcome: Progressing

## 2019-05-10 NOTE — Progress Notes (Signed)
Patient is asleep, so this writer will administer morning medications once he awakes.

## 2019-05-10 NOTE — [facility] Group Notes (Signed)
LCSW Group Therapy Note   05/10/2019 1:00 PM  Type of Therapy and Topic:  Group Therapy:  Overcoming Obstacles   Participation Level:  Did Not Attend   Description of Group:    In this group patients will be encouraged to explore what they see as obstacles to their own wellness and recovery. They will be guided to discuss their thoughts, feelings, and behaviors related to these obstacles. The group will process together ways to cope with barriers, with attention given to specific choices patients can make. Each patient will be challenged to identify changes they are motivated to make in order to overcome their obstacles. This group will be process-oriented, with patients participating in exploration of their own experiences as well as giving and receiving support and challenge from other group members.   Therapeutic Goals: 1. Patient will identify personal and current obstacles as they relate to admission. 2. Patient will identify barriers that currently interfere with their wellness or overcoming obstacles.  3. Patient will identify feelings, thought process and behaviors related to these barriers. 4. Patient will identify two changes they are willing to make to overcome these obstacles:      Summary of Patient Progress   X    Therapeutic Modalities:   Cognitive Behavioral Therapy Solution Focused Therapy Motivational Interviewing Relapse Prevention Therapy  Fredy Gladu, MSW, LCSW 05/10/2019 10:01 AM    
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
## 2019-05-10 NOTE — Progress Notes (Signed)
Patient remains 1:1 for safety with a sitter present. Patient pleasant and is visiting with his mother in the visitation room. Patient asked if he could stay out after visiting with his mother, patient given education and went back to his room.

## 2019-05-10 NOTE — Progress Notes (Signed)
Patient asked this writer if he would be able to go outside today and when he would be able to come out and interact with other members on the unit. This Probation officer stated to patient that some of the other members on the unit are uncomfortable around him. Patient stated "I need conversation, I'm a social person, I need to talk to a man". This Probation officer asked the male security guard to speak with patient, and he agreed.

## 2019-05-11 MED ORDER — CLOZAPINE 100 MG PO TABS
200.0000 mg | ORAL_TABLET | Freq: Every day | ORAL | Status: DC
  Administered 2019-05-11 – 2019-05-13 (×3): 200 mg via ORAL
  Filled 2019-05-11 (×3): qty 2

## 2019-05-11 NOTE — [facility] Group Notes (Signed)
Balance In Life 05/11/2019 1PM  Type of Therapy/Topic:  Group Therapy:  Balance in Life  Participation Level:  None  Description of Group:   This group will address the concept of balance and how it feels and looks when one is unbalanced. Patients will be encouraged to process areas in their lives that are out of balance and identify reasons for remaining unbalanced. Facilitators will guide patients in utilizing problem-solving interventions to address and correct the stressor making their life unbalanced. Understanding and applying boundaries will be explored and addressed for obtaining and maintaining a balanced life. Patients will be encouraged to explore ways to assertively make their unbalanced needs known to significant others in their lives, using other group members and facilitator for support and feedback.  Therapeutic Goals: 1. Patient will identify two or more emotions or situations they have that consume much of in their lives. 2. Patient will identify signs/triggers that life has become out of balance:  3. Patient will identify two ways to set boundaries in order to achieve balance in their lives:  4. Patient will demonstrate ability to communicate their needs through discussion and/or role plays  Summary of Patient Progress: Pt walked in and out of group multiple times today. Pt did not participate in today's session, no input provided.   Therapeutic Modalities:   Cognitive Behavioral Therapy Solution-Focused Therapy Assertiveness Training  Sayra Frisby Lynelle Smoke, LCSW

## 2019-05-11 NOTE — Plan of Care (Signed)
Patient compliant with procedures on the unit. Patient didn't have any outbursts on the unit.   Problem: Coping: Goal: Ability to demonstrate self-control will improve Outcome: Progressing

## 2019-05-11 NOTE — Tx Team (Signed)
Interdisciplinary Treatment and Diagnostic Plan Update  05/11/2019 Time of Session: 900am [name] [name] MRN: [id number]  Principal Diagnosis: Schizoaffective disorder [facility])  Secondary Diagnoses: Principal Problem:   Schizoaffective disorder ([place]) Active Problems:   Noncompliance   Hypertension   Current Medications:  Current Facility-Administered Medications  Medication Dose Route Frequency Provider Last Rate Last Dose  . acetaminophen (TYLENOL) tablet 650 mg  650 mg Oral Q6H PRN Cristofano, Dorene Ar, MD      . alum & mag hydroxide-simeth (MAALOX/MYLANTA) 200-200-20 MG/5ML suspension 30 mL  30 mL Oral Q4H PRN Cristofano, Paul A, MD      . amLODipine (NORVASC) tablet 5 mg  5 mg Oral Daily Cristofano, Dorene Ar, MD   5 mg at 05/11/19 0910  . cloZAPine (CLOZARIL) tablet 150 mg  150 mg Oral QHS [name], Madie Reno, MD   150 mg at 05/10/19 2131  . cloZAPine (CLOZARIL) tablet 50 mg  50 mg Oral Daily Sharma Covert, MD   50 mg at 05/11/19 0910  . diphenhydrAMINE (BENADRYL) injection 50 mg  50 mg Intravenous Q6H PRN [name], Madie Reno, MD   50 mg at 05/07/19 1502  . divalproex (DEPAKOTE) DR tablet 500 mg  500 mg Oral Q12H Money, Lowry Ram, FNP   500 mg at 05/11/19 0909  . haloperidol lactate (HALDOL) injection 5 mg  5 mg Intramuscular Q6H PRN [name], Madie Reno, MD   5 mg at 05/08/19 1859  . LORazepam (ATIVAN) injection 2 mg  2 mg Intramuscular Q6H PRN [name], Madie Reno, MD   2 mg at 05/08/19 1859  . magnesium hydroxide (MILK OF MAGNESIA) suspension 30 mL  30 mL Oral Daily PRN Cristofano, Paul A, MD      . nicotine (NICODERM CQ - dosed in mg/24 hours) patch 21 mg  21 mg Transdermal Daily [name], Madie Reno, MD   21 mg at 05/11/19 0909  . [START ON 05/14/2019] paliperidone (INVEGA SUSTENNA) injection 234 mg  234 mg Intramuscular Q28 days Money, Darnelle Maffucci B, FNP      . temazepam (RESTORIL) capsule 15 mg  15 mg Oral QHS PRN [name], Madie Reno, MD   15 mg at 05/10/19 2131   PTA Medications: Medications Prior to  Admission  Medication Sig Dispense Refill Last Dose  . amLODipine (NORVASC) 5 MG tablet Take 1 tablet (5 mg total) by mouth daily. 30 tablet 1   . cloZAPine (CLOZARIL) 100 MG tablet Take 2.5 tablets (250 mg total) by mouth at bedtime. 75 tablet 1   . divalproex (DEPAKOTE) 500 MG DR tablet Take 1 tablet (500 mg total) by mouth every 12 (twelve) hours. 60 tablet 1   . fluvoxaMINE (LUVOX) 25 MG tablet Take 1 tablet (25 mg total) by mouth at bedtime. 30 tablet 1   . paliperidone (INVEGA SUSTENNA) 156 MG/ML SUSY injection Inject 1 mL (156 mg total) into the muscle every 28 (twenty-eight) days. 1.2 mL 1     Patient Stressors: Marital or family conflict Medication change or noncompliance  Patient Strengths: Curator fund of knowledge  Treatment Modalities: Medication Management, Group therapy, Case management,  1 to 1 session with clinician, Psychoeducation, Recreational therapy.   Physician Treatment Plan for Primary Diagnosis: Schizoaffective disorder ([place]) Long Term Goal(s): Improvement in symptoms so as ready for discharge Improvement in symptoms so as ready for discharge   Short Term Goals: Ability to demonstrate self-control will improve Ability to identify and develop effective coping behaviors will improve Compliance with prescribed medications will improve  Medication Management: Evaluate patient's response, side effects, and tolerance of medication regimen.  Therapeutic Interventions: 1 to 1 sessions, Unit Group sessions and Medication administration.  Evaluation of Outcomes: Not Met  Physician Treatment Plan for Secondary Diagnosis: Principal Problem:   Schizoaffective disorder ([place]) Active Problems:   Noncompliance   Hypertension  Long Term Goal(s): Improvement in symptoms so as ready for discharge Improvement in symptoms so as ready for discharge   Short Term Goals: Ability to demonstrate self-control will improve Ability to identify and develop  effective coping behaviors will improve Compliance with prescribed medications will improve     Medication Management: Evaluate patient's response, side effects, and tolerance of medication regimen.  Therapeutic Interventions: 1 to 1 sessions, Unit Group sessions and Medication administration.  Evaluation of Outcomes: Not Met   RN Treatment Plan for Primary Diagnosis: Schizoaffective disorder ([place]) Long Term Goal(s): Knowledge of disease and therapeutic regimen to maintain health will improve  Short Term Goals: Ability to remain free from injury will improve, Ability to demonstrate self-control, Ability to participate in decision making will improve, Ability to identify and develop effective coping behaviors will improve and Compliance with prescribed medications will improve  Medication Management: RN will administer medications as ordered by provider, will assess and evaluate patient's response and provide education to patient for prescribed medication. RN will report any adverse and/or side effects to prescribing provider.  Therapeutic Interventions: 1 on 1 counseling sessions, Psychoeducation, Medication administration, Evaluate responses to treatment, Monitor vital signs and CBGs as ordered, Perform/monitor CIWA, COWS, AIMS and Fall Risk screenings as ordered, Perform wound care treatments as ordered.  Evaluation of Outcomes: Not Met   LCSW Treatment Plan for Primary Diagnosis: Schizoaffective disorder ([place]) Long Term Goal(s): Safe transition to appropriate next level of care at discharge, Engage patient in therapeutic group addressing interpersonal concerns.  Short Term Goals: Engage patient in aftercare planning with referrals and resources, Increase emotional regulation, Facilitate acceptance of mental health diagnosis and concerns, Identify triggers associated with mental health/substance abuse issues and Increase skills for wellness and recovery  Therapeutic Interventions: Assess  for all discharge needs, 1 to 1 time with Social worker, Explore available resources and support systems, Assess for adequacy in community support network, Educate family and significant other(s) on suicide prevention, Complete Psychosocial Assessment, Interpersonal group therapy.  Evaluation of Outcomes: Not Met   Progress in Treatment: Attending groups: Yes. Participating in groups: No. Taking medication as prescribed: Yes. Toleration medication: Yes. Family/Significant other contact made: Yes, individual(s) contacted:  Pt gave permission to contact his 21yrold brother, did not want CSW's to contact anyone else. Patient understands diagnosis: No. Discussing patient identified problems/goals with staff: No. Medical problems stabilized or resolved: Yes. Denies suicidal/homicidal ideation: Yes. Issues/concerns per patient self-inventory: No. Other: N/A  New problem(s) identified: No, Describe:  none  New Short Term/Long Term Goal(s): Detox, elimination of AVH/symptoms of psychosis, medication management for mood stabilization; elimination of SI thoughts; development of comprehensive mental wellness/sobriety plan.   Patient Goals:  Pt refused to attend treatment team  Discharge Plan or Barriers: SPE pamphlet, Mobile Crisis information, and AA/NA information provided to patient for additional community support and resources. CSW assessing for appropriate referrals. Pt refused to sign ROI for his ACTT team at this time. 05/11/19-Pt is being followed by [facility]. At discharge, pt will return home and follow up with his ACT Team. Reason for Continuation of Hospitalization: Aggression Hallucinations Mania Medication stabilization  Estimated Length of Stay: TBD  Attendees: Patient: 05/11/2019  9:45 AM  Physician: Dr Weber Cooks MD 05/11/2019 9:45 AM  Nursing: Polly Cobia RN 05/11/2019 9:45 AM  Case Manager:  05/11/2019 9:45 AM  Social Worker: Minette Brine Moton Hina Gupta Sharlyne Cai 05/11/2019 9:45 AM  Recreational Therapist: Roanna Epley CTRS LRT 05/11/2019 9:45 AM  Other:  05/11/2019 9:45 AM  Other:  05/11/2019 9:45 AM  Other: 05/11/2019 9:45 AM    Scribe for Treatment Team: Yvette Rack, LCSW 05/11/2019 9:45 AM

## 2019-05-11 NOTE — Plan of Care (Signed)
D- Patient alert and oriented. Patient presents in a pleasant mood on assessment stating that he slept "well" last night. Patient denies SI, HI, AVH, and pain at this time. Patient also denies any signs/symptoms of depression and anxiety, stating that overall he is "feeling good". Patient had no stated goals for today. Patient has been observed out in the milieu interacting well with no issues thus far.  A- Scheduled medications administered to patient, per MD orders. Support and encouragement provided.  Routine safety checks conducted every 15 minutes.  Patient informed to notify staff with problems or concerns.  R- No adverse drug reactions noted. Patient contracts for safety at this time. Patient compliant with medications and treatment plan. Patient receptive, calm, and cooperative. Patient interacts well with others on the unit.  Patient remains safe at this time.  Problem: Education: Goal: Knowledge of [place] [facility] Education information/materials will improve Outcome: Progressing   Problem: Coping: Goal: Ability to verbalize frustrations and anger appropriately will improve Outcome: Progressing Goal: Ability to demonstrate self-control will improve Outcome: Progressing   Problem: Safety: Goal: Periods of time without injury will increase Outcome: Progressing

## 2019-05-11 NOTE — Progress Notes (Signed)
Patient remains on 1:1 for safety with a sitter present. Patient is asleep and without complaint at this time. Patient remains safe on the unit.  

## 2019-05-11 NOTE — Progress Notes (Signed)
Patient remains on 1:1 for safety with a sitter present. Patient is asleep and without complaint at this time. Patient remains safe on the unit.

## 2019-05-11 NOTE — Progress Notes (Signed)
[facility] MD Progress Note  05/11/2019 2:10 PM [name] [name]  MRN:  [id number] Subjective: Follow-up for this patient with schizoaffective disorder.  Patient seen in his room.  He was cooperative with the interview although withdrawn flat and seemed to be answering questions in a way that he thought would be most pleasing.  Patient denies that he is having suicidal or homicidal thoughts and denies any hallucinations.  For several days he had been very animated on the unit and now it looks like he is going back into his more depressed-looking phase where he stays withdrawn in bed much of the time.  He denies however feeling depressed denies suicidal thoughts.  Focuses as usual only on wanting to get out of the hospital.  We tried talking about what he would do when he gets out of the hospital.  It is clear that he does not have a very rational plan. Principal Problem: Schizoaffective disorder ([place]) Diagnosis: Principal Problem:   Schizoaffective disorder ([place]) Active Problems:   Noncompliance   Hypertension  Total Time spent with patient: 30 minutes  Past Psychiatric History: Past history of schizophrenia or schizoaffective disorder multiple hospitalizations recurrent noncompliance  Past Medical History:  Past Medical History:  Diagnosis Date  . Asthma   . Depression   . Psychosis ([place])   . Schizoaffective disorder [facility])     Past Surgical History:  Procedure Laterality Date  . BACK SURGERY    . I&D groin  2017   Family History: History reviewed. No pertinent family history. Family Psychiatric  History: See previous Social History:  Social History   Substance and Sexual Activity  Alcohol Use No     Social History   Substance and Sexual Activity  Drug Use Never    Social History   Socioeconomic History  . Marital status: Single    Spouse name: Not on file  . Number of children: Not on file  . Years of education: Not on file  . Highest education level: Not on file   Occupational History  . Not on file  Social Needs  . Financial resource strain: Not on file  . Food insecurity    Worry: Not on file    Inability: Not on file  . Transportation needs    Medical: Not on file    Non-medical: Not on file  Tobacco Use  . Smoking status: Never Smoker  . Smokeless tobacco: Never Used  Substance and Sexual Activity  . Alcohol use: No  . Drug use: Never  . Sexual activity: Not Currently  Lifestyle  . Physical activity    Days per week: Not on file    Minutes per session: Not on file  . Stress: Not on file  Relationships  . Social Herbalist on phone: Not on file    Gets together: Not on file    Attends religious service: Not on file    Active member of club or organization: Not on file    Attends meetings of clubs or organizations: Not on file    Relationship status: Not on file  Other Topics Concern  . Not on file  Social History Narrative  . Not on file   Additional Social History:                         Sleep: Fair  Appetite:  Fair  Current Medications: Current Facility-Administered Medications  Medication Dose Route Frequency Provider Last Rate Last  Dose  . acetaminophen (TYLENOL) tablet 650 mg  650 mg Oral Q6H PRN Cristofano, Worthy Rancher, MD      . alum & mag hydroxide-simeth (MAALOX/MYLANTA) 200-200-20 MG/5ML suspension 30 mL  30 mL Oral Q4H PRN Cristofano, Paul A, MD      . amLODipine (NORVASC) tablet 5 mg  5 mg Oral Daily Cristofano, Worthy Rancher, MD   5 mg at 05/11/19 0910  . cloZAPine (CLOZARIL) tablet 200 mg  200 mg Oral QHS [name], [name], MD      . cloZAPine (CLOZARIL) tablet 50 mg  50 mg Oral Daily Antonieta Pert, MD   50 mg at 05/11/19 0910  . diphenhydrAMINE (BENADRYL) injection 50 mg  50 mg Intravenous Q6H PRN [name], [name], MD   50 mg at 05/07/19 1502  . divalproex (DEPAKOTE) DR tablet 500 mg  500 mg Oral Q12H Money, Gerlene Burdock, FNP   500 mg at 05/11/19 0909  . haloperidol lactate (HALDOL) injection 5 mg   5 mg Intramuscular Q6H PRN [name], [name], MD   5 mg at 05/08/19 1859  . LORazepam (ATIVAN) injection 2 mg  2 mg Intramuscular Q6H PRN [name], [name], MD   2 mg at 05/08/19 1859  . magnesium hydroxide (MILK OF MAGNESIA) suspension 30 mL  30 mL Oral Daily PRN Cristofano, Paul A, MD      . nicotine (NICODERM CQ - dosed in mg/24 hours) patch 21 mg  21 mg Transdermal Daily [name], [name], MD   21 mg at 05/11/19 0909  . [START ON 05/14/2019] paliperidone (INVEGA SUSTENNA) injection 234 mg  234 mg Intramuscular Q28 days Money, Gerlene Burdock, FNP      . temazepam (RESTORIL) capsule 15 mg  15 mg Oral QHS PRN [name], [name], MD   15 mg at 05/10/19 2131    Lab Results:  Results for orders placed or performed during the hospital encounter of 05/04/19 (from the past 48 hour(s))  CBC with Differential/Platelet     Status: None   Collection Time: 05/10/19  6:28 AM  Result Value Ref Range   WBC 6.3 4.0 - 10.5 K/uL   RBC 5.40 4.22 - 5.81 MIL/uL   Hemoglobin 15.0 13.0 - 17.0 g/dL   HCT 09.8 11.9 - 14.7 %   MCV 80.9 80.0 - 100.0 fL   MCH 27.8 26.0 - 34.0 pg   MCHC 34.3 30.0 - 36.0 g/dL   RDW 82.9 56.2 - 13.0 %   Platelets 334 150 - 400 K/uL   nRBC 0.0 0.0 - 0.2 %   Neutrophils Relative % 56 %   Neutro Abs 3.5 1.7 - 7.7 K/uL   Lymphocytes Relative 34 %   Lymphs Abs 2.1 0.7 - 4.0 K/uL   Monocytes Relative 10 %   Monocytes Absolute 0.6 0.1 - 1.0 K/uL   Eosinophils Relative 0 %   Eosinophils Absolute 0.0 0.0 - 0.5 K/uL   Basophils Relative 0 %   Basophils Absolute 0.0 0.0 - 0.1 K/uL   Immature Granulocytes 0 %   Abs Immature Granulocytes 0.02 0.00 - 0.07 K/uL    Comment: Performed at [facility], [address] [place], [place] [address]  Valproic acid level     Status: None   Collection Time: 05/10/19  6:28 AM  Result Value Ref Range   Valproic Acid Lvl 76 50.0 - 100.0 ug/mL    Comment: Performed at [facility], [address], [place], [place] [address]  Hepatic function  panel     Status: None   Collection Time: 05/10/19  6:28 AM  Result Value Ref Range   Total Protein 7.1 6.5 - 8.1 g/dL   Albumin 4.0 3.5 - 5.0 g/dL   AST 35 15 - 41 U/L   ALT 37 0 - 44 U/L   Alkaline Phosphatase 98 38 - 126 U/L   Total Bilirubin 0.8 0.3 - 1.2 mg/dL   Bilirubin, Direct <3.1 0.0 - 0.2 mg/dL   Indirect Bilirubin NOT CALCULATED 0.3 - 0.9 mg/dL    Comment: Performed at [facility], [address] [place], [place] [address]    Blood Alcohol level:  Lab Results  Component Value Date   [facility] <10 05/03/2019   ETH <10 01/01/2019    Metabolic Disorder Labs: Lab Results  Component Value Date   HGBA1C 5.1 08/10/2018   MPG 99.67 08/10/2018   MPG 93.93 03/08/2018   No results found for: PROLACTIN Lab Results  Component Value Date   CHOL 166 05/05/2019   TRIG 117 05/05/2019   HDL 47 05/05/2019   CHOLHDL 3.5 05/05/2019   VLDL 23 05/05/2019   LDLCALC 96 05/05/2019   LDLCALC 108 (H) 08/10/2018    Physical Findings: AIMS:  , ,  ,  ,    CIWA:    COWS:     Musculoskeletal: Strength & Muscle Tone: within normal limits Gait & Station: normal Patient leans: N/A  Psychiatric Specialty Exam: Physical Exam  Nursing note and vitals reviewed. Constitutional: He appears well-developed and well-nourished.  HENT:  Head: Normocephalic and atraumatic.  Eyes: Pupils are equal, round, and reactive to light. Conjunctivae are normal.  Neck: Normal range of motion.  Cardiovascular: Normal heart sounds.  Respiratory: Effort normal.  GI: Soft.  Musculoskeletal: Normal range of motion.  Neurological: He is alert.  Skin: Skin is warm and dry.  Psychiatric: His affect is blunt. His speech is delayed. He is slowed and withdrawn. Thought content is delusional. Cognition and memory are impaired. He expresses impulsivity and inappropriate judgment. He expresses no homicidal and no suicidal ideation.    Review of Systems  Constitutional: Negative.   HENT: Negative.    Eyes: Negative.   Respiratory: Negative.   Cardiovascular: Negative.   Gastrointestinal: Negative.   Musculoskeletal: Negative.   Skin: Negative.   Neurological: Negative.   Psychiatric/Behavioral: Negative for depression, hallucinations, memory loss, substance abuse and suicidal ideas. The patient is not nervous/anxious and does not have insomnia.     Blood pressure 133/90, pulse 95, temperature 97.8 F (36.6 C), temperature source Oral, resp. rate 18, height 6\' 1"  (1.854 m), weight 106.6 kg, SpO2 100 %.Body mass index is 31 kg/m.  General Appearance: Casual  Eye Contact:  Fair  Speech:  Clear and Coherent  Volume:  Normal  Mood:  Euthymic  Affect:  Flat  Thought Process:  Coherent  Orientation:  Full (Time, Place, and Person)  Thought Content:  Illogical  Suicidal Thoughts:  No  Homicidal Thoughts:  No  Memory:  Immediate;   Fair Recent;   Fair Remote;   Fair  Judgement:  Impaired  Insight:  Shallow  Psychomotor Activity:  Decreased  Concentration:  Concentration: Fair  Recall:  of Knowledge:  Fair  Language:  Fair  Akathisia:  No  Handed:  Right  AIMS (if indicated):     Assets:  Desire for Improvement Housing  ADL's:  Impaired  Cognition:  Impaired,  Mild  Sleep:  Number of Hours: 7  Treatment Plan Summary: Daily contact with patient to assess and evaluate symptoms and progress in treatment, Medication management and Plan Patient with schizoaffective disorder.  Less agitated today.  His Depakote level came back in the therapeutic range.  His clozapine dose is gradually increasing and he is tolerating it.  Tried to talk with him a little bit more today about more reasonable options for where he would live and how to take care of himself outside the hospital.  Patient is focused on being irritated at his mother and he is not really able to make any other decisions.  I am hoping at some point we can get his act team to come in and visit us so that we can  have a group meeting around that.  For now increase clozapine dose tonight to 200 mg.  Mordecai RasmussenJohn [name], MD 05/11/2019, 2:10 PM

## 2019-05-11 NOTE — Progress Notes (Signed)
D - Patient was pacing the hallways upon arrival to the unit. Patient was pleasant during assessment denying SI/HI/AVH, pain, anxiety and depression. Patient was observed interacting appropriately with staff and peers. Patient wasn't as isolative has he has been in previous admissions. Patient didn't want to visit with his mother this evening when she came to see him.   A - Patient compliant with medication administration per MD orders and procedures on the unit. Patient given education. Patient given support and encouragement to be active in his treatment plan. Patient informed to let staff know if there are any issues or problems on the unit.   R - Patient being monitored Q 15 minutes for safety per unit protocol. Patient remains safe on the unit.

## 2019-05-11 NOTE — Progress Notes (Signed)
Recreation Therapy Notes  Date: 05/11/2019  Time: 9:30 am   Location: Craft room   Behavioral response: N/A   Intervention Topic: Problem Solving   Discussion/Intervention: Patient did not attend group.   Clinical Observations/Feedback:  Patient did not attend group.   [name] [name] LRT/CTRS        [name] 05/11/2019 11:03 AM

## 2019-05-12 NOTE — [facility] Group Notes (Signed)
LCSW Group Therapy Note  05/12/2019 11:41 AM  Type of Therapy/Topic:  Group Therapy:  Emotion Regulation  Participation Level:  Did Not Attend   Description of Group:   The purpose of this group is to assist patients in learning to regulate negative emotions and experience positive emotions. Patients will be guided to discuss ways in which they have been vulnerable to their negative emotions. These vulnerabilities will be juxtaposed with experiences of positive emotions or situations, and patients will be challenged to use positive emotions to combat negative ones. Special emphasis will be placed on coping with negative emotions in conflict situations, and patients will process healthy conflict resolution skills.  Therapeutic Goals: 1. Patient will identify two positive emotions or experiences to reflect on in order to balance out negative emotions 2. Patient will label two or more emotions that they find the most difficult to experience 3. Patient will demonstrate positive conflict resolution skills through discussion and/or role plays  Summary of Patient Progress: x   Therapeutic Modalities:   Cognitive Behavioral Therapy Feelings Identification Dialectical Behavioral Therapy   Evalina Field, MSW, LCSW Clinical Social Work 05/12/2019 11:41 AM

## 2019-05-12 NOTE — Plan of Care (Signed)
Pt denies depression, anxiety, SI, HI and AVH. Pt was educated on care plan and verbalizes understanding. Pt was encouraged to attend group. Collier Bullock RN Problem: Education: Goal: Knowledge of [place] [facility] Education information/materials will improve Outcome: Progressing   Problem: Coping: Goal: Ability to verbalize frustrations and anger appropriately will improve Outcome: Progressing Goal: Ability to demonstrate self-control will improve Outcome: Progressing   Problem: Safety: Goal: Periods of time without injury will increase Outcome: Progressing

## 2019-05-12 NOTE — Progress Notes (Signed)
Pt has been calm and cooperative. Pt has done a lot of pacing. Pt has attended groups and went outside. Collier Bullock RN

## 2019-05-12 NOTE — Progress Notes (Signed)
Recreation Therapy Notes   Date:05/12/2019  Time: 9:30 am  Location: Craft room  Behavioral response: Appropriate  Intervention Topic: Self-esteem  Discussion/Intervention:  Group content today was focused on self-esteem. Patient defined self-esteem and where it comes form. The group described reasons self-esteem is important. Individuals stated things that impact self-esteem and positive ways to improve self-esteem. The group participated in the intervention "Collage of Me" where patients were able to create a collage of positive things that makes them who they are. Clinical Observations/Feedback:  Patient came to group late and stared at peers and staff.  Cledith Kamiya LRT/CTRS         Hooria Gasparini 05/12/2019 11:30 AM

## 2019-05-12 NOTE — Progress Notes (Signed)
[facility] MD Progress Note  05/12/2019 4:35 PM [name] [name]  MRN:  [id number] Subjective: Follow-up for this gentleman with schizoaffective disorder.  Today he has been much calmer.  He is no longer doing disruptive things that disturb other patients.  No longer shouting or screaming in the hallway.  Able to sit down and have a somewhat more lucid conversation although I suspect his insight is still limited.  Denies any hallucinations.  Sleeping adequately. Principal Problem: Schizoaffective disorder (HCC) Diagnosis: Principal Problem:   Schizoaffective disorder (HCC) Active Problems:   Noncompliance   Hypertension  Total Time spent with patient: 20 minutes  Past Psychiatric History: Past history of rapid worsening of schizophrenic symptoms over the past couple years with several hospitalizations because of poor outpatient compliance  Past Medical History:  Past Medical History:  Diagnosis Date  . Asthma   . Depression   . Psychosis (HCC)   . Schizoaffective disorder [facility])     Past Surgical History:  Procedure Laterality Date  . BACK SURGERY    . I&D groin  2017   Family History: History reviewed. No pertinent family history. Family Psychiatric  History: See previous Social History:  Social History   Substance and Sexual Activity  Alcohol Use No     Social History   Substance and Sexual Activity  Drug Use Never    Social History   Socioeconomic History  . Marital status: Single    Spouse name: Not on file  . Number of children: Not on file  . Years of education: Not on file  . Highest education level: Not on file  Occupational History  . Not on file  Social Needs  . Financial resource strain: Not on file  . Food insecurity    Worry: Not on file    Inability: Not on file  . Transportation needs    Medical: Not on file    Non-medical: Not on file  Tobacco Use  . Smoking status: Never Smoker  . Smokeless tobacco: Never Used  Substance and Sexual Activity  .  Alcohol use: No  . Drug use: Never  . Sexual activity: Not Currently  Lifestyle  . Physical activity    Days per week: Not on file    Minutes per session: Not on file  . Stress: Not on file  Relationships  . Social Musician on phone: Not on file    Gets together: Not on file    Attends religious service: Not on file    Active member of club or organization: Not on file    Attends meetings of clubs or organizations: Not on file    Relationship status: Not on file  Other Topics Concern  . Not on file  Social History Narrative  . Not on file   Additional Social History:                         Sleep: Fair  Appetite:  Fair  Current Medications: Current Facility-Administered Medications  Medication Dose Route Frequency Provider Last Rate Last Dose  . acetaminophen (TYLENOL) tablet 650 mg  650 mg Oral Q6H PRN Cristofano, Worthy Rancher, MD      . alum & mag hydroxide-simeth (MAALOX/MYLANTA) 200-200-20 MG/5ML suspension 30 mL  30 mL Oral Q4H PRN Cristofano, Paul A, MD      . amLODipine (NORVASC) tablet 5 mg  5 mg Oral Daily Cristofano, Worthy Rancher, MD   5 mg at  05/12/19 0756  . cloZAPine (CLOZARIL) tablet 200 mg  200 mg Oral QHS Breelyn Icard T, MD   200 mg at 05/11/19 2120  . cloZAPine (CLOZARIL) tablet 50 mg  50 mg Oral Daily Sharma Covert, MD   50 mg at 05/12/19 0756  . diphenhydrAMINE (BENADRYL) injection 50 mg  50 mg Intravenous Q6H PRN Avonlea Sima, Madie Reno, MD   50 mg at 05/07/19 1502  . divalproex (DEPAKOTE) DR tablet 500 mg  500 mg Oral Q12H Money, Darnelle Maffucci B, FNP   500 mg at 05/12/19 0756  . haloperidol lactate (HALDOL) injection 5 mg  5 mg Intramuscular Q6H PRN Geniva Lohnes, Madie Reno, MD   5 mg at 05/08/19 1859  . LORazepam (ATIVAN) injection 2 mg  2 mg Intramuscular Q6H PRN Aaima Gaddie, Madie Reno, MD   2 mg at 05/08/19 1859  . magnesium hydroxide (MILK OF MAGNESIA) suspension 30 mL  30 mL Oral Daily PRN Cristofano, Paul A, MD      . nicotine (NICODERM CQ - dosed in mg/24 hours)  patch 21 mg  21 mg Transdermal Daily Cyniah Gossard, Madie Reno, MD   21 mg at 05/11/19 0909  . [START ON 05/14/2019] paliperidone (INVEGA SUSTENNA) injection 234 mg  234 mg Intramuscular Q28 days Money, Darnelle Maffucci B, FNP      . temazepam (RESTORIL) capsule 15 mg  15 mg Oral QHS PRN Tyrese Capriotti, Madie Reno, MD   15 mg at 05/11/19 2120    Lab Results: No results found for this or any previous visit (from the past 48 hour(s)).  Blood Alcohol level:  Lab Results  Component Value Date   ETH <10 05/03/2019   ETH <10 38/18/2993    Metabolic Disorder Labs: Lab Results  Component Value Date   HGBA1C 5.1 08/10/2018   MPG 99.67 08/10/2018   MPG 93.93 03/08/2018   No results found for: PROLACTIN Lab Results  Component Value Date   CHOL 166 05/05/2019   TRIG 117 05/05/2019   HDL 47 05/05/2019   CHOLHDL 3.5 05/05/2019   VLDL 23 05/05/2019   LDLCALC 96 05/05/2019   LDLCALC 108 (H) 08/10/2018    Physical Findings: AIMS:  , ,  ,  ,    CIWA:    COWS:     Musculoskeletal: Strength & Muscle Tone: within normal limits Gait & Station: normal Patient leans: N/A  Psychiatric Specialty Exam: Physical Exam  Nursing note and vitals reviewed. Constitutional: He appears well-developed and well-nourished.  HENT:  Head: Normocephalic and atraumatic.  Eyes: Pupils are equal, round, and reactive to light. Conjunctivae are normal.  Neck: Normal range of motion.  Cardiovascular: Regular rhythm and normal heart sounds.  Respiratory: Effort normal.  GI: Soft.  Musculoskeletal: Normal range of motion.  Neurological: He is alert.  Skin: Skin is warm and dry.  Psychiatric: He has a normal mood and affect. His behavior is normal. Judgment and thought content normal.    Review of Systems  Constitutional: Negative.   HENT: Negative.   Eyes: Negative.   Respiratory: Negative.   Cardiovascular: Negative.   Gastrointestinal: Negative.   Musculoskeletal: Negative.   Skin: Negative.   Neurological: Negative.    Psychiatric/Behavioral: Negative.     Blood pressure (!) 139/91, pulse (!) 103, temperature 98.1 F (36.7 C), temperature source Oral, resp. rate 18, height 6\' 1"  (1.854 m), weight 106.6 kg, SpO2 100 %.Body mass index is 31 kg/m.  General Appearance: Casual  Eye Contact:  Fair  Speech:  Slow  Volume:  Decreased  Mood:  Euthymic  Affect:  Constricted  Thought Process:  Coherent  Orientation:  Full (Time, Place, and Person)  Thought Content:  Logical  Suicidal Thoughts:  No  Homicidal Thoughts:  No  Memory:  Immediate;   Fair Recent;   Fair Remote;   Fair  Judgement:  Fair  Insight:  Fair  Psychomotor Activity:  Decreased  Concentration:  Concentration: Fair  Recall:  [employer]  Fund of Knowledge:  Fair  Language:  Fair  Akathisia:  No  Handed:  Right  AIMS (if indicated):     Assets:  Desire for Improvement Housing Physical Health  ADL's:  Intact  Cognition:  Impaired,  Mild  Sleep:  Number of Hours: 7     Treatment Plan Summary: Daily contact with patient to assess and evaluate symptoms and progress in treatment, Medication management and Plan Patient with schizoaffective disorder.  As usual once he gets on his antipsychotic and now he is up to a reasonable dose of clozapine his symptoms improved dramatically.  The problem is once he leaves the hospital he will probably refuse medicine again and once again decompensate.  He expresses no interest in going to a group home but wants to return to his mother's house again.  I spent some time with him explaining why outpatient compliance was important and repeat hospitalizations were just making his long-term course of his illness worse.  No change in medicine today.  Possible discharge in 2 to 3 days.  Mordecai RasmussenJohn Purva Vessell, MD 05/12/2019, 4:35 PM

## 2019-05-13 NOTE — Progress Notes (Signed)
CSW spoke with pt regarding discharge plan and signing ROI and consent forms. Pt was agreeable and signed ROI for [facility], but continues to decline for CSW team to speak with his mother.    Evalina Field, MSW, LCSW Clinical Social Work 05/13/2019 2:44 PM

## 2019-05-13 NOTE — BHH Group Notes (Signed)
LCSW Group Therapy Note  05/13/2019 1:00 PM  Type of Therapy/Topic:  Group Therapy:  Balance in Life  Participation Level:  None  Description of Group:    This group will address the concept of balance and how it feels and looks when one is unbalanced. Patients will be encouraged to process areas in their lives that are out of balance and identify reasons for remaining unbalanced. Facilitators will guide patients in utilizing problem-solving interventions to address and correct the stressor making their life unbalanced. Understanding and applying boundaries will be explored and addressed for obtaining and maintaining a balanced life. Patients will be encouraged to explore ways to assertively make their unbalanced needs known to significant others in their lives, using other group members and facilitator for support and feedback.  Therapeutic Goals: 1. Patient will identify two or more emotions or situations they have that consume much of in their lives. 2. Patient will identify signs/triggers that life has become out of balance:  3. Patient will identify two ways to set boundaries in order to achieve balance in their lives:  4. Patient will demonstrate ability to communicate their needs through discussion and/or role plays  Summary of Patient Progress: Patient paced into and out of group, however, did not engage in group discussion.    Therapeutic Modalities:   Cognitive Behavioral Therapy Solution-Focused Therapy Assertiveness Training  Assunta Curtis MSW, LCSW 05/13/2019 11:31 AM

## 2019-05-13 NOTE — Progress Notes (Signed)
[facility]  05/13/2019 5:39 PM [name] [name]  MRN:  [id number] Subjective: Follow-up for this young man with schizoaffective disorder.  Patient's behavior has been much improved today as it has been in the last few days.  He has not been aggressive agitated or inappropriate.  He tells me that the voices are all gone right now.  Denies any suicidal or homicidal thoughts.  At least superficially shows some insight and says that he would agree to finally be compliant with medicine outside the hospital. Principal Problem: Schizoaffective disorder (HCC) Diagnosis: Principal Problem:   Schizoaffective disorder (HCC) Active Problems:   Noncompliance   Hypertension  Total Time spent with patient: 20 minutes  Past Psychiatric History: Past psychiatric history of chronic schizophrenia multiple hospitalizations  Past Medical History:  Past Medical History:  Diagnosis Date  . Asthma   . Depression   . Psychosis (HCC)   . Schizoaffective disorder [facility](HCC)     Past Surgical History:  Procedure Laterality Date  . BACK SURGERY    . I&D groin  2017   Family History: History reviewed. No pertinent family history. Family Psychiatric  History: See previous Social History:  Social History   Substance and Sexual Activity  Alcohol Use No     Social History   Substance and Sexual Activity  Drug Use Never    Social History   Socioeconomic History  . Marital status: Single    Spouse name: Not on file  . Number of children: Not on file  . Years of education: Not on file  . Highest education level: Not on file  Occupational History  . Not on file  Social Needs  . Financial resource strain: Not on file  . Food insecurity    Worry: Not on file    Inability: Not on file  . Transportation needs    Medical: Not on file    Non-medical: Not on file  Tobacco Use  . Smoking status: Never Smoker  . Smokeless tobacco: Never Used  Substance and Sexual Activity  . Alcohol use: No  .  Drug use: Never  . Sexual activity: Not Currently  Lifestyle  . Physical activity    Days per week: Not on file    Minutes per session: Not on file  . Stress: Not on file  Relationships  . Social Musicianconnections    Talks on phone: Not on file    Gets together: Not on file    Attends religious service: Not on file    Active member of club or organization: Not on file    Attends meetings of clubs or organizations: Not on file    Relationship status: Not on file  Other Topics Concern  . Not on file  Social History Narrative  . Not on file   Additional Social History:                         Sleep: Fair  Appetite:  Fair  Current Medications: Current Facility-Administered Medications  Medication Dose Route Frequency Provider Last Rate Last Dose  . acetaminophen (TYLENOL) tablet 650 mg  650 mg Oral Q6H PRN Cristofano, Worthy RancherPaul A, MD      . alum & mag hydroxide-simeth (MAALOX/MYLANTA) 200-200-20 MG/5ML suspension 30 mL  30 mL Oral Q4H PRN Cristofano, Paul A, MD      . amLODipine (NORVASC) tablet 5 mg  5 mg Oral Daily Cristofano, Worthy RancherPaul A, MD   5 mg at  05/13/19 0900  . cloZAPine (CLOZARIL) tablet 200 mg  200 mg Oral QHS [name], [name], MD   200 mg at 05/12/19 2104  . cloZAPine (CLOZARIL) tablet 50 mg  50 mg Oral Daily Sharma Covert, MD   50 mg at 05/13/19 0900  . diphenhydrAMINE (BENADRYL) injection 50 mg  50 mg Intravenous Q6H PRN [name], Madie Reno, MD   50 mg at 05/07/19 1502  . divalproex (DEPAKOTE) DR tablet 500 mg  500 mg Oral Q12H Money, Darnelle Maffucci B, FNP   500 mg at 05/13/19 0900  . haloperidol lactate (HALDOL) injection 5 mg  5 mg Intramuscular Q6H PRN [name], Madie Reno, MD   5 mg at 05/08/19 1859  . LORazepam (ATIVAN) injection 2 mg  2 mg Intramuscular Q6H PRN [name], Madie Reno, MD   2 mg at 05/08/19 1859  . magnesium hydroxide (MILK OF MAGNESIA) suspension 30 mL  30 mL Oral Daily PRN Cristofano, Paul A, MD      . nicotine (NICODERM CQ - dosed in mg/24 hours) patch 21 mg  21 mg  Transdermal Daily [name], Madie Reno, MD   21 mg at 05/11/19 0909  . [START ON 05/14/2019] paliperidone (INVEGA SUSTENNA) injection 234 mg  234 mg Intramuscular Q28 days Money, Darnelle Maffucci B, FNP      . temazepam (RESTORIL) capsule 15 mg  15 mg Oral QHS PRN [name], Madie Reno, MD   15 mg at 05/12/19 2106    Lab Results: No results found for this or any previous visit (from the past 48 hour(s)).  Blood Alcohol level:  Lab Results  Component Value Date   ETH <10 05/03/2019   ETH <10 48/18/5631    Metabolic Disorder Labs: Lab Results  Component Value Date   HGBA1C 5.1 08/10/2018   MPG 99.67 08/10/2018   MPG 93.93 03/08/2018   No results found for: PROLACTIN Lab Results  Component Value Date   CHOL 166 05/05/2019   TRIG 117 05/05/2019   HDL 47 05/05/2019   CHOLHDL 3.5 05/05/2019   VLDL 23 05/05/2019   LDLCALC 96 05/05/2019   LDLCALC 108 (H) 08/10/2018    Physical Findings: AIMS:  , ,  ,  ,    CIWA:    COWS:     Musculoskeletal: Strength & Muscle Tone: within normal limits Gait & Station: normal Patient leans: N/A  Psychiatric Specialty Exam: Physical Exam  Nursing note and vitals reviewed. Constitutional: He appears well-developed and well-nourished.  HENT:  Head: Normocephalic and atraumatic.  Eyes: Pupils are equal, round, and reactive to light. Conjunctivae are normal.  Neck: Normal range of motion.  Cardiovascular: Regular rhythm and normal heart sounds.  Respiratory: Effort normal. No respiratory distress.  GI: Soft.  Musculoskeletal: Normal range of motion.  Neurological: He is alert.  Skin: Skin is warm and dry.  Psychiatric: Judgment normal. His affect is blunt. His speech is delayed. He is slowed. Thought content is not paranoid. Cognition and memory are normal. He expresses no homicidal and no suicidal ideation.    Review of Systems  Constitutional: Negative.   HENT: Negative.   Eyes: Negative.   Respiratory: Negative.   Cardiovascular: Negative.    Gastrointestinal: Negative.   Musculoskeletal: Negative.   Skin: Negative.   Neurological: Negative.   Psychiatric/Behavioral: Negative for depression, hallucinations, memory loss, substance abuse and suicidal ideas. The patient is not nervous/anxious and does not have insomnia.     Blood pressure (!) 139/91, pulse (!) 103, temperature 98.1 F (36.7 C), temperature  source Oral, resp. rate 18, height 6\' 1"  (1.854 m), weight 106.6 kg, SpO2 100 %.Body mass index is 31 kg/m.  General Appearance: Casual  Eye Contact:  Good  Speech:  Clear and Coherent  Volume:  Normal  Mood:  Euthymic  Affect:  Congruent  Thought Process:  Goal Directed  Orientation:  Full (Time, Place, and Person)  Thought Content:  Logical  Suicidal Thoughts:  No  Homicidal Thoughts:  No  Memory:  Immediate;   Fair Recent;   Fair Remote;   Fair  Judgement:  Fair  Insight:  Fair  Psychomotor Activity:  Decreased  Concentration:  Concentration: Fair  Recall:  of Knowledge:  Fair  Language:  Fair  Akathisia:  No  Handed:  Right  AIMS (if indicated):     Assets:  Desire for Improvement Housing Physical Health  ADL's:  Intact  Cognition:  WNL  Sleep:  Number of Hours: 7     Treatment Plan Summary: Daily contact with patient to assess and evaluate symptoms and progress in treatment, Medication management and Plan No change to current medicine.  Spent time reviewing and educating him about his medicine and the importance of staying on it.  Patient is requesting discharge tomorrow.  If he is behaving fine overnight and gotten some sleep we will probably proceed with that as he has follow-up with his act team.  [employer], MD 05/13/2019, 5:39 PM

## 2019-05-13 NOTE — Progress Notes (Signed)
Recreation Therapy Notes   Date: 05/13/2019  Time: 9:30 am   Location: Craft room   Behavioral response: N/A   Intervention Topic: Emotions  Discussion/Intervention: Patient did not attend group.   Clinical Observations/Feedback:  Patient did not attend group.   Cristal Howatt LRT/CTRS          Siddhant Hashemi 05/13/2019 10:59 AM

## 2019-05-13 NOTE — [facility] Suicide Risk Assessment (Signed)
[facility] Discharge Suicide Risk Assessment   Principal Problem: Schizoaffective disorder [facility]) Discharge Diagnoses: Principal Problem:   Schizoaffective disorder ([place]) Active Problems:   Noncompliance   Hypertension   Total Time spent with patient: 30 minutes  Musculoskeletal: Strength & Muscle Tone: within normal limits Gait & Station: normal Patient leans: N/A  Psychiatric Specialty Exam: Review of Systems  Constitutional: Negative.   HENT: Negative.   Eyes: Negative.   Respiratory: Negative.   Cardiovascular: Negative.   Gastrointestinal: Negative.   Musculoskeletal: Negative.   Skin: Negative.   Neurological: Negative.   Psychiatric/Behavioral: Negative.     Blood pressure (!) 139/91, pulse (!) 103, temperature 98.1 F (36.7 C), temperature source Oral, resp. rate 18, height 6\' 1"  (1.854 m), weight 106.6 kg, SpO2 100 %.Body mass index is 31 kg/m.  General Appearance: Casual  Eye Contact::  Fair  Speech:  Clear and [id number]  Volume:  Decreased  Mood:  Euthymic  Affect:  Constricted  Thought Process:  Goal Directed  Orientation:  Full (Time, Place, and Person)  Thought Content:  Logical  Suicidal Thoughts:  No  Homicidal Thoughts:  No  Memory:  Immediate;   Fair Recent;   Fair Remote;   Fair  Judgement:  Fair  Insight:  Fair  Psychomotor Activity:  Decreased  Concentration:  Fair  Recall:  [employer] of Knowledge:Fair  Language: Fair  Akathisia:  No  Handed:  Right  AIMS (if indicated):     Assets:  Desire for Improvement Housing Physical Health Social Support  Sleep:  Number of Hours: 7  Cognition: WNL  ADL's:  Intact   Mental Status Per Nursing Assessment::   On Admission:  NA  Demographic Factors:  Male, Adolescent or young adult and Living alone  Loss Factors: Financial problems/change in socioeconomic status  Historical Factors: Impulsivity  Risk Reduction Factors:   Positive social support and Positive therapeutic  relationship  Continued Clinical Symptoms:  Schizophrenia:   Less than 30 years old  Cognitive Features That Contribute To Risk:  None    Suicide Risk:  Minimal: No identifiable suicidal ideation.  Patients presenting with no risk factors but with morbid ruminations; may be classified as minimal risk based on the severity of the depressive symptoms  Follow-up [place]. Follow up.   Contact information: [place] [address] [phone]           Plan Of Care/Follow-up recommendations:  Activity:  Activity as tolerated Diet:  Regular diet Other:  Follow-up with Johann Capers, MD 05/13/2019, 5:42 PM

## 2019-05-13 NOTE — Plan of Care (Signed)
Pt pacing a lot in the hallway at the beginning of the shift. Pt seen along side peers but engaging in minimal interaction. Pt intrusive at times, coming to the nurses station and asking questions about when his medications are due. Pt also waving randomly to nurses. Pt denies SI/HI/AVH. Q 15 min safety check maintained. Pt took Hs medications and went ot bed after a while.  Problem: Education: Goal: Knowledge of [employer] [facility] Education information/materials will improve Outcome: Progressing   Problem: Coping: Goal: Ability to verbalize frustrations and anger appropriately will improve Outcome: Progressing Goal: Ability to demonstrate self-control will improve Outcome: Progressing   Problem: Safety: Goal: Periods of time without injury will increase Outcome: Progressing

## 2019-05-13 NOTE — Plan of Care (Signed)
D: Pt denies SI/HI. Patient continues to be preoccupied in his thoughts. Patient requires redirection throughout the day. Patient is currently lying in bed awake this morning. A: Pt was offered support and encouragement. Pt was given scheduled medications. Q 15 minute checks were done for safety.  R: Pt is taking medication. Pt has no complaints.Pt receptive to treatment and safety maintained on unit.    Problem: Safety: Goal: Periods of time without injury will increase Outcome: Progressing Note: Patient remains safe on unit

## 2019-05-14 MED ORDER — PALIPERIDONE PALMITATE ER 234 MG/1.5ML IM SUSY: 234.0000 mg | PREFILLED_SYRINGE | INTRAMUSCULAR | 1 refills | Status: DC

## 2019-05-14 MED ORDER — DIVALPROEX SODIUM 500 MG PO DR TAB: 500.0000 mg | DELAYED_RELEASE_TABLET | Freq: Two times a day (BID) | ORAL | 1 refills | Status: DC

## 2019-05-14 MED ORDER — CLOZAPINE 200 MG PO TABS: 200.0000 mg | ORAL_TABLET | Freq: Every day | ORAL | 1 refills | Status: DC

## 2019-05-14 MED ORDER — CLOZAPINE 50 MG PO TABS: 50.0000 mg | ORAL_TABLET | Freq: Every day | ORAL | 1 refills | Status: DC

## 2019-05-14 MED ORDER — PALIPERIDONE PALMITATE ER 234 MG/1.5ML IM SUSY: 234.0000 mg | PREFILLED_SYRINGE | Freq: Once | INTRAMUSCULAR | Status: DC

## 2019-05-14 MED ORDER — TEMAZEPAM 15 MG PO CAPS: 15.0000 mg | ORAL_CAPSULE | Freq: Every evening | ORAL | 1 refills | Status: DC | PRN

## 2019-05-14 MED ORDER — AMLODIPINE BESYLATE 5 MG PO TABS: 5.0000 mg | ORAL_TABLET | Freq: Every day | ORAL | 1 refills | Status: DC

## 2019-05-14 NOTE — Progress Notes (Signed)
Patient denies SI/HI, denies A/V hallucinations. Patient verbalizes understanding of discharge istructions, follow up care. Patient given all belongings from [facility] locker. Patient escorted out by staff, transported by cab.

## 2019-05-14 NOTE — Progress Notes (Signed)
D - Patient was pacing the hallways upon arrival to the unit. Patient was pleasant during assessment denying SI/HI/AVH, pain, anxiety and depression. Patient observed interacting appropriately with staff and peers this evening. Patient stated he is ready to leave tomorrow.   A - Patient compliant with medication administration per MD orders and procedures on the unit. Patient given education. Patient given support and encouragement to be active in his treatment plan. Patient informed to let staff know if there are any issues or problems on the unit.   R - Patient being monitored Q 15 minutes for safety per unit protocol. Patient remains safe on the unit.

## 2019-05-14 NOTE — Progress Notes (Signed)
Recreation Therapy Notes  INPATIENT RECREATION TR PLAN  Patient Details Name: [name] [name] MRN: [id number] DOB: 07/04/1996 Today's Date: 05/14/2019  Rec Therapy Plan Is patient appropriate for Therapeutic Recreation?: Yes Treatment times per week: at least 3 Estimated Length of Stay: 5-7 days TR Treatment/Interventions: Group participation (Comment)  Discharge Criteria Pt will be discharged from therapy if:: Discharged Treatment plan/goals/alternatives discussed and agreed upon by:: Patient/family  Discharge Summary Short term goals set: Patient will engage in interactions with peers and staff in pro-social manner at least 2x within 5 recreation therapy group sessions Short term goals met: Complete Progress toward goals comments: Groups attended Which groups?: Other (Comment), Leisure education, Self-esteem(Necessities) Reason goals not met: N/A Therapeutic equipment acquired: N/A Reason patient discharged from therapy: Discharge from hospital Pt/family agrees with progress & goals achieved: Yes Date patient discharged from therapy: 05/14/19      05/14/2019, 12:12 PM  

## 2019-05-14 NOTE — Plan of Care (Signed)
Patient compliant with procedures on the unit.   Problem: Education: Goal: Knowledge of [facility] Education information/materials will improve Outcome: Progressing   
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
## 2019-05-14 NOTE — BHH Counselor (Signed)
CSW spoke with Janice([facility] Team lead) and informed her the pt is scheduled to discharge today. Thayer Headings reports the pt's younger brother should be at the home to let him in and she does not think he has a house key. She also states the pt's mother usually gets off of work at either 6pm or 9pm. CSW informed her the pt will be arriving by cab. CSW staffed case with supervisor(Greg Rose Fillers) who states he is in agreement with this plan.

## 2019-05-14 NOTE — Progress Notes (Signed)
Recreation Therapy Notes  Date: 05/14/2019  Time: 9:30 am  Location: Craft room  Behavioral response: Appropriate   Intervention Topic: Leisure  Discussion/Intervention:  Group content today was focused on leisure. The group defined what leisure is and some positive leisure activities they participate in. Individuals identified the difference between good and bad leisure. Participants expressed how they feel after participating in the leisure of their choice. The group discussed how they go about picking a leisure activity and if others are involved in their leisure activities. The patient stated how many leisure activities they too choose from and reasons why it is important to have leisure time. Individuals participated in the intervention "Exploration of Leisure" where they had a chance to identify new leisure activities as well as benefits of leisure. Clinical Observations/Feedback:  Patient came to group late due to unknown reasons. Individual was social with peers and staff while participating in group.  Celes Dedic LRT/CTRS         Kevonte Vanecek 05/14/2019 11:57 AM

## 2019-05-14 NOTE — Progress Notes (Signed)
  [facility] Adult Case Management Discharge Plan :  Will you be returning to the same living situation after discharge:  Yes,  lives with parent At discharge, do you have transportation home?: Yes,  pt will be provided with taxi voucher Do you have the ability to pay for your medications: Yes,  Medicaid  Release of information consent forms completed and in the chart;  Patient's signature needed at discharge.  Patient to Follow up at: Follow-up Information    [place].. Go on 05/15/2019.   Why: Your ACT Team is scheduled to meet with you on Saturday, October 24th and you are scheduled to meet with Dr. Doyne Keel on Monday, October 26th. Thank you. Contact information: [place] [address] [phone]           Next level of care provider has access to [place] and Suicide Prevention discussed: Yes,  with pt, pt gave permission to speak with his 59 yr old brother and would not give permission to speak with his mother  Have you used any form of tobacco in the last 30 days? (Cigarettes, Smokeless Tobacco, Cigars, and/or Pipes): Yes  Has patient been referred to the Quitline?: Patient refused referral  Patient has been referred for addiction treatment: N/A  Yvette Rack, LCSW 05/14/2019, 11:14 AM

## 2019-08-04 ENCOUNTER — Encounter: Payer: Self-pay | Admitting: *Deleted

## 2019-08-14 ENCOUNTER — Other Ambulatory Visit: Payer: Self-pay

## 2019-08-14 ENCOUNTER — Encounter (HOSPITAL_COMMUNITY): Payer: Self-pay | Admitting: Emergency Medicine

## 2019-08-14 ENCOUNTER — Emergency Department (HOSPITAL_COMMUNITY)
Admission: EM | Admit: 2019-08-14 | Discharge: 2019-08-14 | Disposition: A | Discharge status: Home / Self Care | Payer: Medicaid Other | Attending: Emergency Medicine | Admitting: Emergency Medicine

## 2019-08-14 DIAGNOSIS — I10 Essential (primary) hypertension: Secondary | ICD-10-CM | POA: Insufficient documentation

## 2019-08-14 DIAGNOSIS — Z20822 Contact with and (suspected) exposure to covid-19: Secondary | ICD-10-CM | POA: Insufficient documentation

## 2019-08-14 DIAGNOSIS — Z79899 Other long term (current) drug therapy: Secondary | ICD-10-CM | POA: Diagnosis not present

## 2019-08-14 DIAGNOSIS — Z008 Encounter for other general examination: Secondary | ICD-10-CM

## 2019-08-14 DIAGNOSIS — F209 Schizophrenia, unspecified: Secondary | ICD-10-CM

## 2019-08-14 DIAGNOSIS — F259 Schizoaffective disorder, unspecified: Secondary | ICD-10-CM | POA: Insufficient documentation

## 2019-08-14 DIAGNOSIS — E860 Dehydration: Secondary | ICD-10-CM | POA: Diagnosis present

## 2019-08-14 LAB — COMPREHENSIVE METABOLIC PANEL
ALT: 42 U/L (ref 0–44)
AST: 30 U/L (ref 15–41)
Albumin: 4.2 g/dL (ref 3.5–5.0)
Alkaline Phosphatase: 94 U/L (ref 38–126)
Anion gap: 12 (ref 5–15)
BUN: 9 mg/dL (ref 6–20)
CO2: 23 mmol/L (ref 22–32)
Calcium: 9.6 mg/dL (ref 8.9–10.3)
Chloride: 105 mmol/L (ref 98–111)
Creatinine, Ser: 1.11 mg/dL (ref 0.61–1.24)
GFR calc Af Amer: >60 mL/min (ref >60)
GFR calc non Af Amer: >60 mL/min (ref >60)
Glucose, Bld: 144 mg/dL — ABNORMAL HIGH (ref 70–99)
Potassium: 3.6 mmol/L (ref 3.5–5.1)
Sodium: 140 mmol/L (ref 135–145)
Total Bilirubin: 0.9 mg/dL (ref 0.3–1.2)
Total Protein: 7.5 g/dL (ref 6.5–8.1)

## 2019-08-14 LAB — CBC WITH DIFFERENTIAL/PLATELET
Abs Immature Granulocytes: 0.01 10*3/uL (ref 0.00–0.07)
Basophils Absolute: 0 10*3/uL (ref 0.0–0.1)
Basophils Relative: 0 %
Eosinophils Absolute: 0 10*3/uL (ref 0.0–0.5)
Eosinophils Relative: 0 %
HCT: 47.5 % (ref 39.0–52.0)
Hemoglobin: 16.5 g/dL (ref 13.0–17.0)
Immature Granulocytes: 0 %
Lymphocytes Relative: 31 %
Lymphs Abs: 1.7 10*3/uL (ref 0.7–4.0)
MCH: 28 pg (ref 26.0–34.0)
MCHC: 34.7 g/dL (ref 30.0–36.0)
MCV: 80.6 fL (ref 80.0–100.0)
Monocytes Absolute: 0.4 10*3/uL (ref 0.1–1.0)
Monocytes Relative: 7 %
Neutro Abs: 3.4 10*3/uL (ref 1.7–7.7)
Neutrophils Relative %: 62 %
Platelets: 297 10*3/uL (ref 150–400)
RBC: 5.89 MIL/uL — ABNORMAL HIGH (ref 4.22–5.81)
RDW: 12.3 % (ref 11.5–15.5)
WBC: 5.5 10*3/uL (ref 4.0–10.5)
nRBC: 0 % (ref 0.0–0.2)

## 2019-08-14 LAB — RAPID URINE DRUG SCREEN, HOSP PERFORMED
Amphetamines: NOT DETECTED
Barbiturates: NOT DETECTED
Benzodiazepines: NOT DETECTED
Cocaine: NOT DETECTED
Opiates: NOT DETECTED
Tetrahydrocannabinol: POSITIVE — AB

## 2019-08-14 LAB — ETHANOL: Alcohol, Ethyl (B): <10 mg/dL (ref <10)

## 2019-08-14 LAB — RESPIRATORY PANEL BY RT PCR (FLU A&B, COVID)
Influenza A by PCR: NEGATIVE
Influenza B by PCR: NEGATIVE
SARS Coronavirus 2 by RT PCR: NEGATIVE

## 2019-08-14 NOTE — ED Notes (Signed)
Pt pacing in room and into hallway, reminded he must stay in room at this time. Given [place] sandwich and sprite

## 2019-08-14 NOTE — ED Notes (Signed)
TTS being preformed.

## 2019-08-14 NOTE — ED Notes (Addendum)
Pt pacing back and fourth in and out of room. Pt asked if he would stay in the room. Pt responds and states ok but then continues to pace around.

## 2019-08-14 NOTE — Discharge Instructions (Signed)
Please follow up with your ACTT provider at [facility]

## 2019-08-14 NOTE — BH Assessment (Signed)
Assessment Note  [name] [name] is an 24 y.o. male with history of depression, psychosis and schizophrenia. He presents to [facility] via EMS due to "dehydration" and "panic attack". Patient reports driving from his home in [place] to [facility] Emergency Department. States he took his mothers care and she doesn't know that it's gone. He reports feeling worried that his mother doesn't know her car is missing. States, "I just needed to get help so I left". He reports decreased appetite and hasn't been drinking much water in several days. States that although his appetite has improved he is still not drinking much water. He reports feelings of stress due to "How my life is going and my lack of progress". He reports wanting to do more such as work or go to school. However, has not been able to do so due to his mental illness. Patient denies SI/HI. Denies history of self harm. He denies visual and auditory hallucinations. Denies history of of agreesive behaviors. However, prior visit notes his presentation was agressive. He states he has been compliant with his medication as prescribed. No history of abuse. He is currently receiving outpatient services with [facility].   Collateral information: Counselor spoke with mother [name] [name] [place]) [phone]. She confirmed patient's diagnosis of Schizophrenia. She did not know her car was gone until 2-3 hours ago. States that she used her neighbors car looking for [name] but was unable to find him. States, "I was really worried something happened to him". She denies any significant concerns. However, states that last night [name] [name] didn't take his medications. States that his was usual for him. She feels that no psychiatric admission is needed at this time. She reports, "Let me pick him up from the hospital and bring him home" and "He just needs his rest and medications". His mom does not feel that he is a danger to self and/or others. She has not noticed that he is hearing  voices or seeing things. She states, "If he needs to come back to the hospital I will bring him back or call his ACTT team". States that he receives ACTT services with [facility].   Discussed the above information with Alvina Chou, NP and patient was psych cleared. He does not meet criteria for psychiatric hospitalization at this time.     Diagnosis: F25.9 Schizoaffective Disorder   Past Medical History:  Past Medical History:  Diagnosis Date  . Asthma   . Depression   . Psychosis ([place])   . Schizoaffective disorder [facility])     Past Surgical History:  Procedure Laterality Date  . BACK SURGERY    . I&D groin  2017    Family History: No family history on file.  Social History:  reports that he has never smoked. He has never used smokeless tobacco. He reports that he does not drink alcohol or use drugs.  Additional Social History:  Alcohol / Drug Use Pain Medications: SEE MAR Prescriptions: SEE MAR Over the Counter: None Reported History of alcohol / drug use?: No history of alcohol / drug abuse Longest period of sobriety (when/how long): N/A  CIWA: CIWA-Ar BP: (!) 150/118 Pulse Rate: (!) 111 COWS:    Allergies: No Known Allergies  Home Medications: (Not in a hospital admission)   OB/GYN Status:  No LMP for male patient.  General Assessment Data Assessment unable to be completed: Yes Location of Assessment: [facility] ED TTS Assessment: In system Is this a Tele or Face-to-Face Assessment?: Tele Assessment Is this an Initial  Assessment or a Re-assessment for this encounter?: Initial Assessment Patient Accompanied by:: (self referral) Language Other than English: Yes What is your preferred language: Other (Comment: Enter the language) Living Arrangements: (English) What gender do you identify as?: Male Marital status: Single Maiden name: (n/a) Pregnancy Status: No Living Arrangements: Other (Comment)(mother and 46 yr old brother ) Can pt return to current living  arrangement?: Yes Admission Status: Voluntary Is patient capable of signing voluntary admission?: Yes Referral Source: Self/Family/Friend Insurance type: (Medicaid )     Crisis Care Plan Living Arrangements: Other (Comment)(mother and 50 yr old brother ) Legal Guardian: (no legal guardian ) Name of Psychiatrist: (ACTT Insurance claims handler) Name of Therapist: (ACTT Insurance claims handler)  Education Status Is patient currently in school?: No(completed H.S. ) Is the patient employed, unemployed or receiving disability?: Unemployed  Risk to self with the past 6 months Suicidal Ideation: No Has patient been a risk to self within the past 6 months prior to admission? : No Suicidal Intent: No Has patient had any suicidal intent within the past 6 months prior to admission? : No Is patient at risk for suicide?: No Suicidal Plan?: No Has patient had any suicidal plan within the past 6 months prior to admission? : No Access to Means: No What has been your use of drugs/alcohol within the last 12 months?: (patient denies; mother reports hx of THC) Previous Attempts/Gestures: No How many times?: (0) Other Self Harm Risks: (none reported) Triggers for Past Attempts: Other (Comment)(n/a; no prior triggers and/or attempts) Intentional Self Injurious Behavior: None Family Suicide History: No Recent stressful life event(s): Other (Comment)("How my life is going..."I'm making no progress") Persecutory voices/beliefs?: No Depression: No Depression Symptoms: (denies ) Substance abuse history and/or treatment for substance abuse?: No Suicide prevention information given to non-admitted patients: Not applicable  Risk to Others within the past 6 months Homicidal Ideation: No Does patient have any lifetime risk of violence toward others beyond the six months prior to admission? : No Thoughts of Harm to Others: No Current Homicidal Intent: No Current Homicidal Plan: No Access to Homicidal Means:  No Identified Victim: (n/a) History of harm to others?: (patient denies; however prior admission notes agreessive beh) Assessment of Violence: In past 6-12 months Violent Behavior Description: (none currently; prior notes indicate history) Does patient have access to weapons?: No Does patient have a court date: No Is patient on probation?: No  Psychosis Hallucinations: None noted Delusions: None noted  Mental Status Report Appearance/Hygiene: Unremarkable Eye Contact: Good Motor Activity: Freedom of movement Speech: Logical/coherent Level of Consciousness: Alert Mood: Preoccupied Affect: Appropriate to circumstance Anxiety Level: None Thought Processes: Coherent, Relevant Judgement: Unimpaired Orientation: Person, Place, Time, Situation Obsessive Compulsive Thoughts/Behaviors: None  Cognitive Functioning Concentration: Decreased Memory: Recent Intact, Remote Intact Is patient IDD: No Insight: Fair Impulse Control: Fair Appetite: Poor Have you had any weight changes? : No Change Sleep: Decreased Total Hours of Sleep: (varies, per mother ) Vegetative Symptoms: None  ADLScreening [facility] Assessment Services) Patient's cognitive ability adequate to safely complete daily activities?: No Patient able to express need for assistance with ADLs?: No Independently performs ADLs?: Yes (appropriate for developmental age)  Prior Inpatient Therapy Prior Inpatient Therapy: No  Prior Outpatient Therapy Prior Outpatient Therapy: No Does patient have an ACCT team?: Yes([facility]) Does patient have Intensive In-House Services?  : No Does patient have [facility] services? : No Does patient have P4CC services?: No  ADL Screening (condition at time of admission) Patient's cognitive ability adequate to safely  complete daily activities?: No Is the patient deaf or have difficulty hearing?: No Does the patient have difficulty seeing, even when wearing glasses/contacts?: No Does the  patient have difficulty concentrating, remembering, or making decisions?: No Patient able to express need for assistance with ADLs?: No Does the patient have difficulty dressing or bathing?: No Independently performs ADLs?: Yes (appropriate for developmental age) Does the patient have difficulty walking or climbing stairs?: No Weakness of Legs: None Weakness of Arms/Hands: None  Home Assistive Devices/Equipment Home Assistive Devices/Equipment: None  Therapy Consults (therapy consults require a physician order) PT Evaluation Needed: No OT Evalulation Needed: No SLP Evaluation Needed: No   Values / Beliefs Cultural Requests During Hospitalization: None Spiritual Requests During Hospitalization: None Consults Spiritual Care Consult Needed: No Transition of Care Team Consult Needed: No Advance Directives (For Healthcare) Does Patient Have a Medical Advance Directive?: No          Disposition: Patient is psych cleared per Margreta Journey, NP. Patient to follow up with his ACTT provider @ [facility])  Disposition Initial Assessment Completed for this Encounter: Yes  On Site Evaluation by:   Reviewed with Physician:    Melynda Ripple 08/14/2019 4:21 PM

## 2019-08-14 NOTE — ED Triage Notes (Signed)
Pt to triage via [facility] from a gas station.  Told EMS he was dehydrated because he has not been to a gas station to get water.  On arrival, pt states he just wants to mellow out at the hospital for a few days.  Denies SI/HI.  Denies depression.  Pt requesting EMS to give him a ride to [place].  States he gave his car to someone he just met to "hold onto" for a few days but doesn't know their number.

## 2019-08-14 NOTE — ED Provider Notes (Signed)
[facility] EMERGENCY DEPARTMENT Provider Note   CSN: [id number] Arrival date & time: 08/14/19  1358     History Chief Complaint  Patient presents with  . medical clearance    Jaidyn A Counsell is a 24 y.o. male with a past medical history significant for depression, psychosis, schizophrenia, and asthma who presents to the ED via EMS due to "dehydration" and wanting to "mellow out at the hospital for a few days". Patient states he feels dehydrated because he hasn't had much water over the past 24 hours. Patient notes he "doesn't feel right in the head" and "feels down and depressed". Patient denies SI/HI. He denies visual and auditory hallucinations. He states he has been compliant with his medication as prescribed. Patient denies history of suicide attempts and self harm. Chart reviewed. Patient was admitted on 05/03/2019 under IVC for aggression and engaging in altercations. Patient denies alcohol and drug use. He admits to using tobacco products roughly once a month. Patient denies chest pain, shortness of breath, urinary symptoms, nausea, vomiting, diarrhea, fever, and chills.       Past Medical History:  Diagnosis Date  . Asthma   . Depression   . Psychosis (HCC)   . Schizoaffective disorder [facility])     Patient Active Problem List   Diagnosis Date Noted  . Schizoaffective disorder (HCC) 05/04/2019  . Elevated CPK 01/04/2019  . Hypertension 03/06/2018  . Schizoaffective disorder, bipolar type (HCC) 12/30/2017  . Noncompliance 12/30/2017  . Oral herpes 08/25/2017  . Elevated blood pressure reading 07/07/2017  . Obesity (BMI 30-39.9) 07/07/2017  . Poor posture 07/07/2017  . Snoring 07/07/2017  . Catatonia 02/03/2017  . 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 Routine health maintenance 08/30/2016  . Schizophrenia (HCC) 11/14/2015  . Aggressive behavior of adolescent 10/24/2015    Past Surgical History:  Procedure Laterality Date  . BACK SURGERY    . I&D  groin  2017       No family history on file.  Social History   Tobacco Use  . Smoking status: Never Smoker  . Smokeless tobacco: Never Used  Substance Use Topics  . Alcohol use: No  . Drug use: Never    Home Medications Prior to Admission medications   Medication Sig Start Date End Date Taking? Authorizing Provider  amLODipine (NORVASC) 5 MG tablet Take 1 tablet (5 mg total) by mouth daily. 05/14/19   [name], [name], MD  cloZAPine (CLOZARIL) 200 MG tablet Take 1 tablet (200 mg total) by mouth at bedtime. 05/14/19   [name], [name], MD  cloZAPine (CLOZARIL) 50 MG tablet Take 1 tablet (50 mg total) by mouth daily. 05/15/19   [name], [name], MD  divalproex (DEPAKOTE) 500 MG DR tablet Take 1 tablet (500 mg total) by mouth every 12 (twelve) hours. 05/14/19   [name], [name], MD  paliperidone (INVEGA SUSTENNA) 234 MG/1.5ML SUSY injection Inject 234 mg into the muscle every 28 (twenty-eight) days. 05/14/19   [name], [name], MD  temazepam (RESTORIL) 15 MG capsule Take 1 capsule (15 mg total) by mouth at bedtime as needed for sleep. 05/14/19   [name], [name], MD    Allergies    Patient has no known allergies.  Review of Systems   Review of Systems  Constitutional: Negative for chills and fever.  Respiratory: Negative for shortness of breath.   Cardiovascular: Negative for chest pain.  Gastrointestinal: Negative for abdominal pain, diarrhea, nausea and vomiting.  Genitourinary: Negative for dysuria.  All  other systems reviewed and are negative.   Physical Exam Updated Vital Signs BP (!) 150/118 (BP Location: Right Arm)   Pulse (!) 111   Temp 98.3 F (36.8 C) (Oral)   Resp 15   SpO2 98%   Physical Exam Vitals and nursing note reviewed.  Constitutional:      General: He is not in acute distress.    Appearance: He is not ill-appearing.  HENT:     Head: Normocephalic.     Mouth/Throat:     Mouth: Mucous membranes are moist.     Pharynx: No oropharyngeal exudate or  posterior oropharyngeal erythema.  Eyes:     Conjunctiva/sclera: Conjunctivae normal.     Pupils: Pupils are equal, round, and reactive to light.  Cardiovascular:     Rate and Rhythm: Normal rate and regular rhythm.     Pulses: Normal pulses.     Heart sounds: Normal heart sounds. No murmur. No friction rub. No gallop.   Pulmonary:     Effort: Pulmonary effort is normal.     Breath sounds: Normal breath sounds.  Abdominal:     General: Abdomen is flat. There is no distension.     Palpations: Abdomen is soft.     Tenderness: There is no abdominal tenderness. There is no guarding or rebound.  Musculoskeletal:     Cervical back: Neck supple.     Comments: Able to move all extremities without difficulty.  Skin:    General: Skin is warm and dry.  Neurological:     General: No focal deficit present.     Mental Status: He is alert.  Psychiatric:        Attention and Perception: Attention normal.        Mood and Affect: Mood is depressed. Affect is blunt.     ED Results / Procedures / Treatments   Labs (all labs ordered are listed, but only abnormal results are displayed) Labs Reviewed  RESPIRATORY PANEL BY RT PCR (FLU A&B, COVID)  COMPREHENSIVE METABOLIC PANEL  ETHANOL  RAPID URINE DRUG SCREEN, HOSP PERFORMED  CBC WITH DIFFERENTIAL/PLATELET    EKG None  Radiology No results found.  Procedures Procedures (including critical care time)  Medications Ordered in ED Medications - No data to display  ED Course  I have reviewed the triage vital signs and the nursing notes.  Pertinent labs & imaging results that were available during my care of the patient were reviewed by me and considered in my medical decision making (see chart for details).    MDM Rules/Calculators/A&P                     24 year old male presents to the ED via EMS due to "dehydration" and wanting to be admitted to [facility] due to feelings of "depression" and "not feeling right in the head". Patient has a  history of depression and schizophrenia, but admits to being complaint with medication. Denies SI/HI. Denies auditory and visual hallucinations. Mucus membranes are moist, low suspicion for dehydration. Will obtain medical clearance labs and consult TTS. I anticipate patient with be medically cleared for TTS assessment.   Patient handed off to [facility], PA-C at shift change who will follow-up on labs and TTS consult and reassess patient to determine disposition.   Final Clinical Impression(s) / ED Diagnoses Final diagnoses:  None    Rx / DC Orders ED Discharge Orders    None       Karel Jarvis, Druscilla Brownie,  PA-C 08/14/19 1552    Gerhard Munch, MD 08/15/19 [phone]

## 2019-08-14 NOTE — ED Provider Notes (Signed)
Care assumed from [facility], [place], at shift change, please see their notes for full documentation of patient's complaint/HPI. Briefly, pt here with complaints of dehydration and wanting to "mellow out at the hospital for a few days".  Awaiting labwork and TTS eval; hx of schizophrenia. Plan is to dispo accordingly.   Physical Exam  BP (!) 156/84 (BP Location: Right Arm)   Pulse (!) 110   Temp 98.3 F (36.8 C) (Oral)   Resp 20   SpO2 98%   Physical Exam  ED Course/Procedures     Procedures  MDM  Labwork reassuring at this time.  CBC without leukocytosis to suggest infection. Hgb is stable.  CMP without electrolyte abnormalities. Creatinine 1.11; no change in GFR to suggest AKI. This is around pt's baseline creatinine levels.  EtOH negative.  UDS positive for THC.  COVID test negative. Flu negative.   Pt medically cleared. Awaiting TTS.   TTS has evaluated patient and cleared him from a psych standpoint. Advised to follow up with his ACTT team at [facility]. Pt discharged at this time.   This note was prepared using Dragon voice recognition software and may include unintentional dictation errors due to the inherent limitations of voice recognition software.        Tanda Rockers, PA-C 08/14/19 1748    Blane Ohara, MD 08/14/19 1924

## 2020-05-17 ENCOUNTER — Other Ambulatory Visit: Payer: Self-pay

## 2020-05-17 ENCOUNTER — Encounter: Payer: Self-pay | Admitting: Emergency Medicine

## 2020-05-17 ENCOUNTER — Emergency Department
Admission: EM | Admit: 2020-05-17 | Discharge: 2020-05-17 | Disposition: A | Discharge status: Home / Self Care | Payer: Medicaid Other | Attending: Student in an Organized Health Care Education/Training Program | Admitting: Student in an Organized Health Care Education/Training Program

## 2020-05-17 DIAGNOSIS — R1084 Generalized abdominal pain: Secondary | ICD-10-CM | POA: Insufficient documentation

## 2020-05-17 DIAGNOSIS — I10 Essential (primary) hypertension: Secondary | ICD-10-CM | POA: Diagnosis not present

## 2020-05-17 DIAGNOSIS — J45909 Unspecified asthma, uncomplicated: Secondary | ICD-10-CM | POA: Insufficient documentation

## 2020-05-17 DIAGNOSIS — R109 Unspecified abdominal pain: Secondary | ICD-10-CM | POA: Diagnosis present

## 2020-05-17 DIAGNOSIS — Z79899 Other long term (current) drug therapy: Secondary | ICD-10-CM | POA: Insufficient documentation

## 2020-05-17 LAB — COMPREHENSIVE METABOLIC PANEL
ALT: 47 U/L — ABNORMAL HIGH (ref 0–44)
AST: 29 U/L (ref 15–41)
Albumin: 4.6 g/dL (ref 3.5–5.0)
Alkaline Phosphatase: 81 U/L (ref 38–126)
Anion gap: 12 (ref 5–15)
BUN: 12 mg/dL (ref 6–20)
CO2: 22 mmol/L (ref 22–32)
Calcium: 8.9 mg/dL (ref 8.9–10.3)
Chloride: 104 mmol/L (ref 98–111)
Creatinine, Ser: 1.08 mg/dL (ref 0.61–1.24)
GFR, Estimated: >60 mL/min (ref >60)
Glucose, Bld: 95 mg/dL (ref 70–99)
Potassium: 3.2 mmol/L — ABNORMAL LOW (ref 3.5–5.1)
Sodium: 138 mmol/L (ref 135–145)
Total Bilirubin: 1.5 mg/dL — ABNORMAL HIGH (ref 0.3–1.2)
Total Protein: 7.9 g/dL (ref 6.5–8.1)

## 2020-05-17 LAB — CBC
HCT: 42.1 % (ref 39.0–52.0)
Hemoglobin: 15.2 g/dL (ref 13.0–17.0)
MCH: 28.4 pg (ref 26.0–34.0)
MCHC: 36.1 g/dL — ABNORMAL HIGH (ref 30.0–36.0)
MCV: 78.5 fL — ABNORMAL LOW (ref 80.0–100.0)
Platelets: 299 10*3/uL (ref 150–400)
RBC: 5.36 MIL/uL (ref 4.22–5.81)
RDW: 12.5 % (ref 11.5–15.5)
WBC: 6.8 10*3/uL (ref 4.0–10.5)
nRBC: 0 % (ref 0.0–0.2)

## 2020-05-17 LAB — LIPASE, BLOOD: Lipase: 26 U/L (ref 11–51)

## 2020-05-17 NOTE — ED Notes (Signed)
Pt refused to sign discharge inst.  States he doesn't sign anything without a Clinical research associate.

## 2020-05-17 NOTE — Discharge Instructions (Addendum)
Follow-up with your regular doctor if not improving to 3 days.  Return emergency department worsening.   

## 2020-05-17 NOTE — ED Triage Notes (Signed)
Pt comes into the ED via POV c/o abdominal pain that started today.  Pt denies any N/V/D.  Pt in NAD at this time with even and unlabored respirations. Pt admits that the pain is generalized in his abdomen and he is also having lower back pain but denies any urinary problems.

## 2020-05-17 NOTE — ED Provider Notes (Signed)
[facility] Emergency Department Provider Note  ____________________________________________   First MD Initiated Contact with Patient 05/17/20 1935     (approximate)  I have reviewed the triage vital signs and the nursing notes.   HISTORY  Chief Complaint Abdominal Pain    HPI [name] [name] is a 24 y.o. male presents emergency department complaint of abdominal pain that started earlier today.  No nausea vomiting or diarrhea.  No fever or chills.  Patient states that he had some back pain.  Patient has history of schizophrenia.    Past Medical History:  Diagnosis Date   Asthma    Depression    Psychosis (HCC)    Schizoaffective disorder [facility])     Patient Active Problem List   Diagnosis Date Noted   Schizoaffective disorder (HCC) 05/04/2019   Elevated CPK 01/04/2019   Hypertension 03/06/2018   Schizoaffective disorder, bipolar type (HCC) 12/30/2017   Noncompliance 12/30/2017   Oral herpes 08/25/2017   Elevated blood pressure reading 07/07/2017   Obesity (BMI 30-39.9) 07/07/2017   Poor posture 07/07/2017   Snoring 07/07/2017   Catatonia 02/03/2017   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 Authorizing Provider  amLODipine (NORVASC) 5 MG tablet Take 1 tablet (5 mg total) by mouth daily. 05/14/19   [name], [name], MD  cloZAPine (CLOZARIL) 200 MG tablet Take 1 tablet (200 mg total) by mouth at bedtime. 05/14/19   [name], [name], MD  cloZAPine (CLOZARIL) 50 MG tablet Take 1 tablet (50 mg total) by mouth daily. 05/15/19   [name], [name], MD  divalproex (DEPAKOTE) 500 MG DR tablet Take 1 tablet (500 mg total) by mouth every 12 (twelve)  hours. 05/14/19   [name], [name], MD  paliperidone (INVEGA SUSTENNA) 234 MG/1.5ML SUSY injection Inject 234 mg into the muscle every 28 (twenty-eight) days. 05/14/19   [name], [name], MD  temazepam (RESTORIL) 15 MG capsule Take 1 capsule (15 mg total) by mouth at bedtime as needed for sleep. 05/14/19   [name], [name], MD    Allergies Patient has no known allergies.  History reviewed. No pertinent family history.  Social History Social History   Tobacco Use   Smoking status: Never Smoker   Smokeless tobacco: Never Used  Building services engineer Use: Never used  Substance Use Topics   Alcohol use: No   Drug use: Never    Review of Systems  Constitutional: No fever/chills Eyes: No visual changes. ENT: No sore throat. Respiratory: Denies cough Cardiovascular: Denies chest pain Gastrointestinal: Positive abdominal pain Genitourinary: Negative for dysuria. Musculoskeletal: Negative for back pain. Skin: Negative for rash. Psychiatric: no mood changes,     ____________________________________________   PHYSICAL EXAM:  VITAL SIGNS: ED Triage Vitals [05/17/20 1840]  Enc Vitals Group     BP 112/87     Pulse Rate (!) 123     Resp 17     Temp 97.8 F (36.6 C)     Temp Source Oral     SpO2 98 %     Weight      Height 6' (1.829 m)     Head Circumference      Peak Flow  Pain Score 4     Pain Loc      Pain Edu?      Excl. in GC?     Constitutional: Alert and oriented. Well appearing and in no acute distress. Eyes: Conjunctivae are normal.  Head: Atraumatic. Nose: No congestion/rhinnorhea. Mouth/Throat: Mucous membranes are moist.   Neck:  supple no lymphadenopathy noted Cardiovascular: Normal rate, regular rhythm. Heart sounds are normal Respiratory: Normal respiratory effort.  No retractions, lungs c t a  Abd: soft nontender bs normal all 4 quad, no CVA tenderness GU: deferred Musculoskeletal: FROM all extremities, warm and well perfused, spine is  nontender Neurologic:  Normal speech and language.  Skin:  Skin is warm, dry and intact. No rash noted. Psychiatric: Mood and affect are normal. Speech and behavior are normal.  ____________________________________________   LABS (all labs ordered are listed, but only abnormal results are displayed)  Labs Reviewed  COMPREHENSIVE METABOLIC PANEL - Abnormal; Notable for the following components:      Result Value   Potassium 3.2 (*)    ALT 47 (*)    Total Bilirubin 1.5 (*)    All other components within normal limits  CBC - Abnormal; Notable for the following components:   MCV 78.5 (*)    MCHC 36.1 (*)    All other components within normal limits  LIPASE, BLOOD  URINALYSIS, COMPLETE (UACMP) WITH MICROSCOPIC   ____________________________________________   ____________________________________________  RADIOLOGY    ____________________________________________   PROCEDURES  Procedure(s) performed: No  Procedures    ____________________________________________   INITIAL IMPRESSION / ASSESSMENT AND PLAN / ED COURSE  Pertinent labs & imaging results that were available during my care of the patient were reviewed by me and considered in my medical decision making (see chart for details).   Patient is a 24 year old male with history of schizophrenia presents with abdominal pain.  See HPI.  Physical exam shows patient to appear well.  He is pacing in the hallway.  Physical exam did not show any abdominal tenderness or back tenderness.  Labs that were ordered from triage are normal CBC is normal, lipase normal, comprehensive metabolic panel is normal.  Patient is refusing to urinate.  He will be discharged home in stable condition.    [name] [name] was evaluated in Emergency Department on 05/17/2020 for the symptoms described in the history of present illness. He was evaluated in the context of the global COVID-19 pandemic, which necessitated consideration that the  patient might be at risk for infection with the SARS-CoV-2 virus that causes COVID-19. Institutional protocols and algorithms that pertain to the evaluation of patients at risk for COVID-19 are in a state of rapid change based on information released by regulatory bodies including the CDC and federal and state organizations. These policies and algorithms were followed during the patient's care in the ED.    As part of my medical decision making, I reviewed the following data within the electronic MEDICAL RECORD NUMBER Nursing notes reviewed and incorporated, Labs reviewed , Old chart reviewed, Notes from prior ED visits and [place] Controlled Substance Database  ____________________________________________   FINAL CLINICAL IMPRESSION(S) / ED DIAGNOSES  Final diagnoses:  Generalized abdominal pain      NEW MEDICATIONS STARTED DURING THIS VISIT:  New Prescriptions   No medications on file     Note:  This document was prepared using Dragon voice recognition software and may include unintentional dictation errors.    Faythe Ghee, PA-C 05/17/20 1953  Willy Eddy, MD 05/17/20 2156

## 2020-06-13 ENCOUNTER — Inpatient Hospital Stay
Admission: RE | Admit: 2020-06-13 | Discharge: 2020-06-20 | DRG: 885 | Disposition: A | Discharge status: Home / Self Care | Payer: Medicaid Other | Source: Intra-hospital | Attending: Behavioral Health | Admitting: Behavioral Health

## 2020-06-13 ENCOUNTER — Other Ambulatory Visit: Payer: Self-pay

## 2020-06-13 ENCOUNTER — Emergency Department
Admission: EM | Admit: 2020-06-13 | Discharge: 2020-06-13 | Disposition: A | Discharge status: Home / Self Care | Payer: Medicaid Other | Attending: Student in an Organized Health Care Education/Training Program | Admitting: Student in an Organized Health Care Education/Training Program

## 2020-06-13 ENCOUNTER — Encounter: Payer: Self-pay | Admitting: Emergency Medicine

## 2020-06-13 DIAGNOSIS — Z79899 Other long term (current) drug therapy: Secondary | ICD-10-CM | POA: Insufficient documentation

## 2020-06-13 DIAGNOSIS — Z9114 Patient's other noncompliance with medication regimen: Secondary | ICD-10-CM

## 2020-06-13 DIAGNOSIS — I10 Essential (primary) hypertension: Secondary | ICD-10-CM | POA: Insufficient documentation

## 2020-06-13 DIAGNOSIS — F22 Delusional disorders: Secondary | ICD-10-CM | POA: Diagnosis not present

## 2020-06-13 DIAGNOSIS — Z20822 Contact with and (suspected) exposure to covid-19: Secondary | ICD-10-CM | POA: Insufficient documentation

## 2020-06-13 DIAGNOSIS — F250 Schizoaffective disorder, bipolar type: Principal | ICD-10-CM | POA: Diagnosis present

## 2020-06-13 DIAGNOSIS — Z046 Encounter for general psychiatric examination, requested by authority: Secondary | ICD-10-CM | POA: Diagnosis present

## 2020-06-13 DIAGNOSIS — J45909 Unspecified asthma, uncomplicated: Secondary | ICD-10-CM | POA: Insufficient documentation

## 2020-06-13 DIAGNOSIS — F5105 Insomnia due to other mental disorder: Secondary | ICD-10-CM | POA: Diagnosis present

## 2020-06-13 DIAGNOSIS — F319 Bipolar disorder, unspecified: Secondary | ICD-10-CM | POA: Diagnosis present

## 2020-06-13 LAB — CBC WITH DIFFERENTIAL/PLATELET
Abs Immature Granulocytes: 0.02 10*3/uL (ref 0.00–0.07)
Basophils Absolute: 0 10*3/uL (ref 0.0–0.1)
Basophils Relative: 0 %
Eosinophils Absolute: 0 10*3/uL (ref 0.0–0.5)
Eosinophils Relative: 0 %
HCT: 47.1 % (ref 39.0–52.0)
Hemoglobin: 16.3 g/dL (ref 13.0–17.0)
Immature Granulocytes: 0 %
Lymphocytes Relative: 26 %
Lymphs Abs: 1.4 10*3/uL (ref 0.7–4.0)
MCH: 28.5 pg (ref 26.0–34.0)
MCHC: 34.6 g/dL (ref 30.0–36.0)
MCV: 82.5 fL (ref 80.0–100.0)
Monocytes Absolute: 0.5 10*3/uL (ref 0.1–1.0)
Monocytes Relative: 9 %
Neutro Abs: 3.4 10*3/uL (ref 1.7–7.7)
Neutrophils Relative %: 65 %
Platelets: 309 10*3/uL (ref 150–400)
RBC: 5.71 MIL/uL (ref 4.22–5.81)
RDW: 12.5 % (ref 11.5–15.5)
WBC: 5.3 10*3/uL (ref 4.0–10.5)
nRBC: 0 % (ref 0.0–0.2)

## 2020-06-13 LAB — CBC
HCT: 45.8 % (ref 39.0–52.0)
Hemoglobin: 16.2 g/dL (ref 13.0–17.0)
MCH: 28.5 pg (ref 26.0–34.0)
MCHC: 35.4 g/dL (ref 30.0–36.0)
MCV: 80.5 fL (ref 80.0–100.0)
Platelets: 286 10*3/uL (ref 150–400)
RBC: 5.69 MIL/uL (ref 4.22–5.81)
RDW: 12.3 % (ref 11.5–15.5)
WBC: 5.3 10*3/uL (ref 4.0–10.5)
nRBC: 0 % (ref 0.0–0.2)

## 2020-06-13 LAB — URINE DRUG SCREEN, QUALITATIVE (ARMC ONLY)
Amphetamines, Ur Screen: NOT DETECTED
Barbiturates, Ur Screen: NOT DETECTED
Benzodiazepine, Ur Scrn: NOT DETECTED
Cannabinoid 50 Ng, Ur ~~LOC~~: POSITIVE — AB
Cocaine Metabolite,Ur ~~LOC~~: NOT DETECTED
MDMA (Ecstasy)Ur Screen: NOT DETECTED
Methadone Scn, Ur: NOT DETECTED
Opiate, Ur Screen: NOT DETECTED
Phencyclidine (PCP) Ur S: NOT DETECTED
Tricyclic, Ur Screen: NOT DETECTED

## 2020-06-13 LAB — COMPREHENSIVE METABOLIC PANEL
ALT: 39 U/L (ref 0–44)
AST: 29 U/L (ref 15–41)
Albumin: 4.6 g/dL (ref 3.5–5.0)
Alkaline Phosphatase: 82 U/L (ref 38–126)
Anion gap: 11 (ref 5–15)
BUN: 11 mg/dL (ref 6–20)
CO2: 23 mmol/L (ref 22–32)
Calcium: 9.7 mg/dL (ref 8.9–10.3)
Chloride: 102 mmol/L (ref 98–111)
Creatinine, Ser: 1 mg/dL (ref 0.61–1.24)
GFR, Estimated: >60 mL/min (ref >60)
Glucose, Bld: 119 mg/dL — ABNORMAL HIGH (ref 70–99)
Potassium: 3.7 mmol/L (ref 3.5–5.1)
Sodium: 136 mmol/L (ref 135–145)
Total Bilirubin: 1.2 mg/dL (ref 0.3–1.2)
Total Protein: 7.8 g/dL (ref 6.5–8.1)

## 2020-06-13 LAB — RESP PANEL BY RT-PCR (FLU A&B, COVID) ARPGX2
Influenza A by PCR: NEGATIVE
Influenza B by PCR: NEGATIVE
SARS Coronavirus 2 by RT PCR: NEGATIVE

## 2020-06-13 LAB — ETHANOL: Alcohol, Ethyl (B): <10 mg/dL (ref <10)

## 2020-06-13 LAB — SALICYLATE LEVEL: Salicylate Lvl: <7 mg/dL — ABNORMAL LOW (ref 7.0–30.0)

## 2020-06-13 LAB — ACETAMINOPHEN LEVEL: Acetaminophen (Tylenol), Serum: <10 ug/mL — ABNORMAL LOW (ref 10–30)

## 2020-06-13 MED ORDER — DIVALPROEX SODIUM 500 MG PO DR TAB
500.0000 mg | DELAYED_RELEASE_TABLET | Freq: Two times a day (BID) | ORAL | Status: DC
  Administered 2020-06-14 – 2020-06-20 (×13): 500 mg via ORAL
  Filled 2020-06-13 (×13): qty 1

## 2020-06-13 MED ORDER — MAGNESIUM HYDROXIDE 400 MG/5ML PO SUSP: 30.0000 mL | Freq: Every day | ORAL | Status: DC | PRN

## 2020-06-13 MED ORDER — CLOZAPINE 25 MG PO TABS
50.0000 mg | ORAL_TABLET | Freq: Every day | ORAL | Status: DC
  Administered 2020-06-14: 50 mg via ORAL
  Filled 2020-06-13: qty 2

## 2020-06-13 MED ORDER — ALUM & MAG HYDROXIDE-SIMETH 200-200-20 MG/5ML PO SUSP: 30.0000 mL | ORAL | Status: DC | PRN

## 2020-06-13 MED ORDER — AMLODIPINE BESYLATE 5 MG PO TABS
5.0000 mg | ORAL_TABLET | Freq: Every day | ORAL | Status: DC
  Administered 2020-06-13: 5 mg via ORAL
  Filled 2020-06-13: qty 1

## 2020-06-13 MED ORDER — TEMAZEPAM 7.5 MG PO CAPS
15.0000 mg | ORAL_CAPSULE | Freq: Every evening | ORAL | Status: DC | PRN
  Administered 2020-06-13: 15 mg via ORAL
  Filled 2020-06-13: qty 2

## 2020-06-13 MED ORDER — AMLODIPINE BESYLATE 5 MG PO TABS
5.0000 mg | ORAL_TABLET | Freq: Every day | ORAL | Status: DC
  Administered 2020-06-14 – 2020-06-20 (×7): 5 mg via ORAL
  Filled 2020-06-13 (×7): qty 1

## 2020-06-13 MED ORDER — CLOZAPINE 100 MG PO TABS
50.0000 mg | ORAL_TABLET | Freq: Every day | ORAL | Status: DC
  Administered 2020-06-13: 50 mg via ORAL
  Filled 2020-06-13: qty 1

## 2020-06-13 MED ORDER — TEMAZEPAM 15 MG PO CAPS
15.0000 mg | ORAL_CAPSULE | Freq: Every evening | ORAL | Status: DC | PRN
  Administered 2020-06-14 – 2020-06-19 (×5): 15 mg via ORAL
  Filled 2020-06-13 (×5): qty 1

## 2020-06-13 MED ORDER — DIVALPROEX SODIUM 500 MG PO DR TAB
500.0000 mg | DELAYED_RELEASE_TABLET | Freq: Two times a day (BID) | ORAL | Status: DC
  Administered 2020-06-13: 500 mg via ORAL
  Filled 2020-06-13: qty 1

## 2020-06-13 MED ORDER — ACETAMINOPHEN 325 MG PO TABS: 650.0000 mg | ORAL_TABLET | Freq: Four times a day (QID) | ORAL | Status: DC | PRN

## 2020-06-13 NOTE — ED Notes (Addendum)
Black tennis shoes, black socks, grey pants, green sweater placed in labeled belonging bag. Pt changed into hospital policy clothing with this RN and BPD in room with patient.

## 2020-06-13 NOTE — ED Notes (Signed)
Pt brought to room. Wanded by Technical sales engineer. Shown to room. Pt pacing around the day room.

## 2020-06-13 NOTE — ED Notes (Signed)
IVC moved to BHU 1 

## 2020-06-13 NOTE — BH Assessment (Signed)
Patient is to be admitted to [facility] by Psychiatric Nurse Practitioner Gillermo Murdoch.  Attending Physician will be Dr. Neale Burly.   Patient has been assigned to room 322, by [facility] Charge Nurse [place].   Intake Paper Work has been signed and placed on patient chart.  ER staff is aware of the admission:  Lynden Ang, ER Secretary    Dr. Cline Crock, ER MD   Selena Batten, Patient's Nurse   Angie Patient Access.
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
## 2020-06-13 NOTE — ED Notes (Signed)
INVOLUNTARY/awaiting TTS/PSYCH consult
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
## 2020-06-13 NOTE — ED Notes (Signed)
Resumed care from Analyse [place] t rn.  Pt alert  Pt to go to [facility] .

## 2020-06-13 NOTE — ED Notes (Signed)
Report called to [place] rn in [place].  Jerilynn Som and [place] with pt now.

## 2020-06-13 NOTE — ED Notes (Signed)
Patient very guarded during assessment. Patient laying in bed in fetal position. Patient shook head no during psych assessment questions.

## 2020-06-13 NOTE — ED Triage Notes (Signed)
Pt in via [facility] under IVC 

## 2020-06-13 NOTE — ED Notes (Signed)
IVC PENDING  CONSULT ?

## 2020-06-13 NOTE — ED Notes (Signed)

## 2020-06-13 NOTE — ED Provider Notes (Signed)
-----------------------------------------   11:01 PM on 06/13/2020 -----------------------------------------  Per TTS, patient is being admitted downstairs for [facility]   Loleta Rose, MD 06/13/20 [phone]
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
## 2020-06-14 ENCOUNTER — Other Ambulatory Visit: Payer: Self-pay

## 2020-06-14 DIAGNOSIS — F250 Schizoaffective disorder, bipolar type: Principal | ICD-10-CM

## 2020-06-14 MED ORDER — PALIPERIDONE PALMITATE ER 234 MG/1.5ML IM SUSY
234.0000 mg | PREFILLED_SYRINGE | Freq: Once | INTRAMUSCULAR | Status: AC
  Administered 2020-06-14: 234 mg via INTRAMUSCULAR
  Filled 2020-06-14: qty 1.5

## 2020-06-14 NOTE — Plan of Care (Signed)
D- Patient alert and oriented. Patient presents in a pleasant mood on assessment stating that he slept ok last night and had no complaints to voice to this Clinical research associate. Patient denies SI, HI, AVH, and pain at this time. Patient also denies any signs/symptoms of depression/anxiety, stating that he is feeling "good" overall. Patient had no stated goals for today. Patient has been isolative to room except for meals and medications.  A- Scheduled medications administered to patient, per MD orders. Support and encouragement provided.  Routine safety checks conducted every 15 minutes.  Patient informed to notify staff with problems or concerns.  R- No adverse drug reactions noted. Patient contracts for safety at this time. Patient compliant with medications. Patient receptive, calm, and cooperative. Patient interacts well with others on the unit. Patient remains safe at this time.  Problem: Consults Goal: Concurrent Medical Patient Education Description: (See Patient Education Module for education specifics) Outcome: Progressing   Problem: [facility] Concurrent Medical Problem Goal: LTG-Pt will be physically stable and he/significant other Description: (Patient will be physically stable and he/significant other will be able to verbalize understanding of follow-up care and symptoms that would warrant further treatment) Outcome: Progressing Goal: STG-Vital signs will be within defined limits or stabilized Description: (STG- Vital signs will be within defined limits or stabilized for individual) Outcome: Progressing Goal: STG-Compliance with medication and/or treatment as ordered Description: (STG-Compliance with medication and/or treatment as ordered by MD) Outcome: Progressing Goal: STG-Verbalize two symptoms that would warrant further Description: (STG-Verbalize two symptoms that would warrant further treatment) Outcome: Progressing Goal: STG-Patient will participate in management/stabilization Description:  (STG-Patient will participate in management/stabilization of medical condition) Outcome: Progressing Goal: STG-Other (Specify): Description: STG-Other Concurrent Medical (Specify): Outcome: Progressing   Problem: Activity: Goal: Will verbalize the importance of balancing activity with adequate rest periods Outcome: Progressing   Problem: Education: Goal: Will be free of psychotic symptoms Outcome: Progressing Goal: Knowledge of the prescribed therapeutic regimen will improve Outcome: Progressing   Problem: Coping: Goal: Coping ability will improve Outcome: Progressing Goal: Will verbalize feelings Outcome: Progressing   Problem: Health Behavior/Discharge Planning: Goal: Compliance with prescribed medication regimen will improve Outcome: Progressing   Problem: Nutritional: Goal: Ability to achieve adequate nutritional intake will improve Outcome: Progressing   Problem: Role Relationship: Goal: Ability to communicate needs accurately will improve Outcome: Progressing Goal: Ability to interact with others will improve Outcome: Progressing   Problem: Safety: Goal: Ability to redirect hostility and anger into socially appropriate behaviors will improve Outcome: Progressing Goal: Ability to remain free from injury will improve Outcome: Progressing   Problem: Self-Care: Goal: Ability to participate in self-care as condition permits will improve Outcome: Progressing   Problem: Self-Concept: Goal: Will verbalize positive feelings about self Outcome: Progressing

## 2020-06-14 NOTE — Plan of Care (Signed)
Patient presents with a better affect than previous admissions on the unit   Problem: Education: Goal: Will be free of psychotic symptoms Outcome: Progressing

## 2020-06-14 NOTE — Progress Notes (Signed)
Recreation Therapy Notes  Date: 06/14/2020  Time: 9:30 am   Location: Craft room     Behavioral response: N/A   Intervention Topic: Leisure     Discussion/Intervention: Patient did not attend group.   Clinical Observations/Feedback:  Patient did not attend group.   Neelah Mannings LRT/CTRS         [name] [name] 06/14/2020 11:47 AM

## 2020-06-14 NOTE — H&P (Signed)
Psychiatric Admission Assessment Adult  Patient Identification: [name] [name] MRN:  [id number] Date of Evaluation:  06/14/2020 Chief Complaint:  Bipolar 1 disorder (HCC) [F31.9] Principal Diagnosis: Schizoaffective disorder, bipolar type (HCC) Diagnosis:  Principal Problem:   Schizoaffective disorder, bipolar type (HCC) Active Problems:   Hypertension  History of Present Illness: [name] A Mustafais an 24 y.o.malewho presents to the ER, after he was petitioned for IVC by his ACT Team, [facility] for acute psychosis. He has been off medications for approximately one month, and has become increasingly paranoid. Per the ACT Team, he stopped taking his medications and currently paranoid. When they went to visit him, he was afraid of them and hiding behind his mother. He's also afraid to be home alone and doesn't want his mother to going to work. Other times when they went to the house, the patient wouldn't let them in the home and told them, it was best for them and himself if they leave. Patient assessed one-on-one at bedside today. He is wrapped up in a blanket, but does pull the covers down to speak to provider. He appears paranoid and guarded, but dose answer questions. He is agreeable to restarting his medications in the hospital, and states that they are helpful.   Associated Signs/Symptoms: Depression Symptoms:  insomnia, impaired memory, Duration of Depression Symptoms: No data recorded (Hypo) Manic Symptoms:  Delusions, Distractibility, Anxiety Symptoms:  Excessive Worry, Panic Symptoms, Psychotic Symptoms:  Delusions, Hallucinations: Auditory Paranoia, Duration of Psychotic Symptoms: No data recorded PTSD Symptoms: Negative Total Time spent with patient: 30 minutes  Past Psychiatric History: Patient has a past history of a diagnosis of schizophrenia or schizoaffective disorder.  He has had multiple hospitalizations including having been in the hospital here Oct 2020.   [facility], but remains medication non-compliant..  In the past he showed good response to clozapine He usually has very poor insight into his illness.  Has some aggression when psychotic but no suicide attempts. He also has a history of catatonia lasting for over a week. No history of substance abuse  Is the patient at risk to self? Yes.    Has the patient been a risk to self in the past 6 months? No.  Has the patient been a risk to self within the distant past? No.  Is the patient a risk to others? No.  Has the patient been a risk to others in the past 6 months? No.  Has the patient been a risk to others within the distant past? No.   Prior Inpatient Therapy:   Prior Outpatient Therapy:    Alcohol Screening: 1. How often do you have a drink containing alcohol?: Monthly or less 2. How many drinks containing alcohol do you have on a typical day when you are drinking?: 1 or 2 3. How often do you have six or more drinks on one occasion?: Never AUDIT-C Score: 1 4. How often during the last year have you found that you were not able to stop drinking once you had started?: Never 5. How often during the last year have you failed to do what was normally expected from you because of drinking?: Never 6. How often during the last year have you needed a first drink in the morning to get yourself going after a heavy drinking session?: Never 7. How often during the last year have you had a feeling of guilt of remorse after drinking?: Never 8. How often during the last year  have you been unable to remember what happened the night before because you had been drinking?: Never 9. Have you or someone else been injured as a result of your drinking?: No 10. Has a relative or friend or a doctor or another health worker been concerned about your drinking or suggested you cut down?: No Alcohol Use Disorder Identification Test Final Score (AUDIT): 1 Alcohol Brief  Interventions/Follow-up: AUDIT Score <7 follow-up not indicated Substance Abuse History in the last 12 months:  No. Consequences of Substance Abuse: Negative Previous Psychotropic Medications: Yes  Psychological Evaluations: Yes  Past Medical History:  Past Medical History:  Diagnosis Date  . Asthma   . Depression   . Psychosis (HCC)   . Schizoaffective disorder [facility])     Past Surgical History:  Procedure Laterality Date  . BACK SURGERY    . I&D groin  2017   Family History: History reviewed. No pertinent family history. Family Psychiatric  History: None reported Tobacco Screening: Have you used any form of tobacco in the last 30 days? (Cigarettes, Smokeless Tobacco, Cigars, and/or Pipes): No Social History:  Social History   Substance and Sexual Activity  Alcohol Use No     Social History   Substance and Sexual Activity  Drug Use Never    Additional Social History:                           Allergies:  No Known Allergies Lab Results:  Results for orders placed or performed during the hospital encounter of 06/13/20 (from the past 48 hour(s))  Comprehensive metabolic panel     Status: Abnormal   Collection Time: 06/13/20  1:18 PM  Result Value Ref Range   Sodium 136 135 - 145 mmol/L   Potassium 3.7 3.5 - 5.1 mmol/L   Chloride 102 98 - 111 mmol/L   CO2 23 22 - 32 mmol/L   Glucose, Bld 119 (H) 70 - 99 mg/dL    Comment: Glucose reference range applies only to samples taken after fasting for at least 8 hours.   BUN 11 6 - 20 mg/dL   Creatinine, Ser 4.76 0.61 - 1.24 mg/dL   Calcium 9.7 8.9 - 54.6 mg/dL   Total Protein 7.8 6.5 - 8.1 g/dL   Albumin 4.6 3.5 - 5.0 g/dL   AST 29 15 - 41 U/L   ALT 39 0 - 44 U/L   Alkaline Phosphatase 82 38 - 126 U/L   Total Bilirubin 1.2 0.3 - 1.2 mg/dL   GFR, Estimated >50 >35 mL/min    Comment: (NOTE) Calculated using the CKD-EPI Creatinine Equation (2021)    Anion gap 11 5 - 15    Comment: Performed at [facility], [address] [place], [place] [address]  Ethanol     Status: None   Collection Time: 06/13/20  1:18 PM  Result Value Ref Range   Alcohol, Ethyl (B) <10 <10 mg/dL    Comment: (NOTE) Lowest detectable limit for serum alcohol is 10 mg/dL.  For medical purposes only. Performed at [facility], [address] [place], [place] [address]   Salicylate level     Status: Abnormal   Collection Time: 06/13/20  1:18 PM  Result Value Ref Range   Salicylate Lvl <7.0 (L) 7.0 - 30.0 mg/dL    Comment: Performed at [facility], [address] [place], [place] [address]  Acetaminophen level     Status: Abnormal  Collection Time: 06/13/20  1:18 PM  Result Value Ref Range   Acetaminophen (Tylenol), Serum <10 (L) 10 - 30 ug/mL    Comment: (NOTE) Therapeutic concentrations vary significantly. A range of 10-30 ug/mL  may be an effective concentration for many patients. However, some  are best treated at concentrations outside of this range. Acetaminophen concentrations >150 ug/mL at 4 hours after ingestion  and >50 ug/mL at 12 hours after ingestion are often associated with  toxic reactions.  Performed at [facility], [address] [place], [place] [address]   cbc     Status: None   Collection Time: 06/13/20  1:18 PM  Result Value Ref Range   WBC 5.3 4.0 - 10.5 K/uL   RBC 5.69 4.22 - 5.81 MIL/uL   Hemoglobin 16.2 13.0 - 17.0 g/dL   HCT 60.4 39 - 52 %   MCV 80.5 80.0 - 100.0 fL   MCH 28.5 26.0 - 34.0 pg   MCHC 35.4 30.0 - 36.0 g/dL   RDW 54.0 98.1 - 19.1 %   Platelets 286 150 - 400 K/uL   nRBC 0.0 0.0 - 0.2 %    Comment: Performed at [facility], [address], [place], [place] [address]  Urine Drug Screen, Qualitative     Status: Abnormal   Collection Time: 06/13/20  1:18 PM  Result Value Ref Range   Tricyclic, Ur Screen NONE DETECTED NONE DETECTED   Amphetamines, Ur Screen NONE DETECTED NONE DETECTED   MDMA (Ecstasy)Ur Screen NONE  DETECTED NONE DETECTED   Cocaine Metabolite,Ur [place] NONE DETECTED NONE DETECTED   Opiate, Ur Screen NONE DETECTED NONE DETECTED   Phencyclidine (PCP) Ur S NONE DETECTED NONE DETECTED   Cannabinoid 50 Ng, Ur [place] POSITIVE (A) NONE DETECTED   Barbiturates, Ur Screen NONE DETECTED NONE DETECTED   Benzodiazepine, Ur Scrn NONE DETECTED NONE DETECTED   Methadone Scn, Ur NONE DETECTED NONE DETECTED    Comment: (NOTE) Tricyclics + metabolites, urine    Cutoff 1000 ng/mL Amphetamines + metabolites, urine  Cutoff 1000 ng/mL MDMA (Ecstasy), urine              Cutoff 500 ng/mL Cocaine Metabolite, urine          Cutoff 300 ng/mL Opiate + metabolites, urine        Cutoff 300 ng/mL Phencyclidine (PCP), urine         Cutoff 25 ng/mL Cannabinoid, urine                 Cutoff 50 ng/mL Barbiturates + metabolites, urine  Cutoff 200 ng/mL Benzodiazepine, urine              Cutoff 200 ng/mL Methadone, urine                   Cutoff 300 ng/mL  The urine drug screen provides only a preliminary, unconfirmed analytical test result and should not be used for non-medical purposes. Clinical consideration and professional judgment should be applied to any positive drug screen result due to possible interfering substances. A more specific alternate chemical method must be used in order to obtain a confirmed analytical result. Gas chromatography / mass spectrometry (GC/MS) is the preferred confirm atory method. Performed at [facility], [address] [place], [place] [address]   CBC with Differential/Platelet     Status: None   Collection Time: 06/13/20  1:18 PM  Result Value Ref Range   WBC 5.3 4.0 - 10.5 K/uL  RBC 5.71 4.22 - 5.81 MIL/uL   Hemoglobin 16.3 13.0 - 17.0 g/dL   HCT 16.1 39 - 52 %   MCV 82.5 80.0 - 100.0 fL   MCH 28.5 26.0 - 34.0 pg   MCHC 34.6 30.0 - 36.0 g/dL   RDW 09.6 04.5 - 40.9 %   Platelets 309 150 - 400 K/uL   nRBC 0.0 0.0 - 0.2 %   Neutrophils Relative % 65 %   Neutro  Abs 3.4 1.7 - 7.7 K/uL   Lymphocytes Relative 26 %   Lymphs Abs 1.4 0.7 - 4.0 K/uL   Monocytes Relative 9 %   Monocytes Absolute 0.5 0.1 - 1.0 K/uL   Eosinophils Relative 0 %   Eosinophils Absolute 0.0 0.0 - 0.5 K/uL   Basophils Relative 0 %   Basophils Absolute 0.0 0.0 - 0.1 K/uL   Immature Granulocytes 0 %   Abs Immature Granulocytes 0.02 0.00 - 0.07 K/uL    Comment: Performed at [facility], [address], [place], [place] [address]  Resp Panel by RT-PCR (Flu A&B, Covid) Nasopharyngeal Swab     Status: None   Collection Time: 06/13/20  2:58 PM   Specimen: Nasopharyngeal Swab; Nasopharyngeal(NP) swabs in vial transport medium  Result Value Ref Range   SARS Coronavirus 2 by RT PCR NEGATIVE NEGATIVE    Comment: (NOTE) SARS-CoV-2 target nucleic acids are NOT DETECTED.  The SARS-CoV-2 RNA is generally detectable in upper respiratory specimens during the acute phase of infection. The lowest concentration of SARS-CoV-2 viral copies this assay can detect is 138 copies/mL. A negative result does not preclude SARS-Cov-2 infection and should not be used as the sole basis for treatment or other patient management decisions. A negative result may occur with  improper specimen collection/handling, submission of specimen other than nasopharyngeal swab, presence of viral mutation(s) within the areas targeted by this assay, and inadequate number of viral copies(<138 copies/mL). A negative result must be combined with clinical observations, patient history, and epidemiological information. The expected result is Negative.  Fact Sheet for Patients:  BloggerCourse.com  Fact Sheet for Healthcare Providers:  SeriousBroker.it  This test is no t yet approved or cleared by the [place] FDA and  has been authorized for detection and/or diagnosis of SARS-CoV-2 by FDA under an Emergency Use Authorization (EUA). This EUA will remain   in effect (meaning this test can be used) for the duration of the COVID-19 declaration under Section 564(b)(1) of the Act, 21 U.S.C.section 360bbb-3(b)(1), unless the authorization is terminated  or revoked sooner.       Influenza A by PCR NEGATIVE NEGATIVE   Influenza B by PCR NEGATIVE NEGATIVE    Comment: (NOTE) The Xpert Xpress SARS-CoV-2/FLU/RSV plus assay is intended as an aid in the diagnosis of influenza from Nasopharyngeal swab specimens and should not be used as a sole basis for treatment. Nasal washings and aspirates are unacceptable for Xpert Xpress SARS-CoV-2/FLU/RSV testing.  Fact Sheet for Patients: BloggerCourse.com  Fact Sheet for Healthcare Providers: SeriousBroker.it  This test is not yet approved or cleared by the [place] FDA and has been authorized for detection and/or diagnosis of SARS-CoV-2 by FDA under an Emergency Use Authorization (EUA). This EUA will remain in effect (meaning this test can be used) for the duration of the COVID-19 declaration under Section 564(b)(1) of the Act, 21 U.S.C. section 360bbb-3(b)(1), unless the authorization is terminated or revoked.  Performed at [facility], [address], [place], [place] [address]  Blood Alcohol level:  Lab Results  Component Value Date   ETH <10 06/13/2020   ETH <10 08/14/2019    Metabolic Disorder Labs:  Lab Results  Component Value Date   HGBA1C 5.1 08/10/2018   MPG 99.67 08/10/2018   MPG 93.93 03/08/2018   No results found for: PROLACTIN Lab Results  Component Value Date   CHOL 166 05/05/2019   TRIG 117 05/05/2019   HDL 47 05/05/2019   CHOLHDL 3.5 05/05/2019   VLDL 23 05/05/2019   LDLCALC 96 05/05/2019   LDLCALC 108 (H) 08/10/2018    Current Medications: Current Facility-Administered Medications  Medication Dose Route Frequency Provider Last Rate Last Admin  . acetaminophen (TYLENOL) tablet 650 mg  650 mg  Oral Q6H PRN Gillermo Murdoch, NP      . alum & mag hydroxide-simeth (MAALOX/MYLANTA) 200-200-20 MG/5ML suspension 30 mL  30 mL Oral Q4H PRN Gillermo Murdoch, NP      . amLODipine (NORVASC) tablet 5 mg  5 mg Oral Daily Gillermo Murdoch, NP   5 mg at 06/14/20 0900  . cloZAPine (CLOZARIL) tablet 50 mg  50 mg Oral QHS Gillermo Murdoch, NP      . divalproex (DEPAKOTE) DR tablet 500 mg  500 mg Oral Q12H Gillermo Murdoch, NP   500 mg at 06/14/20 0900  . magnesium hydroxide (MILK OF MAGNESIA) suspension 30 mL  30 mL Oral Daily PRN Gillermo Murdoch, NP      . paliperidone (INVEGA SUSTENNA) injection 234 mg  234 mg Intramuscular Once Jesse Sans, MD      . temazepam (RESTORIL) capsule 15 mg  15 mg Oral QHS PRN Gillermo Murdoch, NP       PTA Medications: Medications Prior to Admission  Medication Sig Dispense Refill Last Dose  . cloZAPine (CLOZARIL) 200 MG tablet Take 1 tablet (200 mg total) by mouth at bedtime. 30 tablet 1   . divalproex (DEPAKOTE) 500 MG DR tablet Take 1 tablet (500 mg total) by mouth every 12 (twelve) hours. 60 tablet 1     Musculoskeletal: Strength & Muscle Tone: within normal limits Gait & Station: normal Patient leans: N/A  Psychiatric Specialty Exam: Physical Exam Vitals and nursing note reviewed.  Constitutional:      Appearance: Normal appearance.  HENT:     Head: Normocephalic and atraumatic.     Right Ear: External ear normal.     Left Ear: External ear normal.     Nose: Nose normal.     Mouth/Throat:     Mouth: Mucous membranes are moist.     Pharynx: Oropharynx is clear.  Eyes:     Extraocular Movements: Extraocular movements intact.     Conjunctiva/sclera: Conjunctivae normal.     Pupils: Pupils are equal, round, and reactive to light.  Cardiovascular:     Rate and Rhythm: Normal rate.     Pulses: Normal pulses.  Pulmonary:     Effort: Pulmonary effort is normal.     Breath sounds: Normal breath sounds.  Abdominal:      General: Abdomen is flat.     Palpations: Abdomen is soft.  Musculoskeletal:        General: No swelling. Normal range of motion.     Cervical back: Normal range of motion and neck supple.  Skin:    General: Skin is warm and dry.  Neurological:     General: No focal deficit present.     Mental Status: He is alert and oriented to person, place, and time.  Psychiatric:        Attention and Perception: He is inattentive. He perceives auditory hallucinations.        Mood and Affect: Mood is anxious. Affect is flat.        Speech: Speech is delayed.        Behavior: Behavior is slowed and withdrawn.        Thought Content: Thought content is paranoid and delusional.        Cognition and Memory: Cognition is impaired. Memory is impaired.        Judgment: Judgment is impulsive.     Review of Systems  Constitutional: Negative for diaphoresis and fatigue.  HENT: Negative for rhinorrhea and sore throat.   Eyes: Negative for photophobia and visual disturbance.  Respiratory: Negative for cough and shortness of breath.   Cardiovascular: Negative for chest pain and palpitations.  Gastrointestinal: Negative for constipation, diarrhea, nausea and vomiting.  Endocrine: Negative for cold intolerance and heat intolerance.  Genitourinary: Negative for difficulty urinating and dysuria.  Musculoskeletal: Negative for arthralgias and back pain.  Skin: Negative for rash and wound.  Allergic/Immunologic: Negative for environmental allergies and food allergies.  Neurological: Negative for dizziness and headaches.  Hematological: Negative for adenopathy. Does not bruise/bleed easily.  Psychiatric/Behavioral: Positive for behavioral problems, hallucinations and sleep disturbance. The patient is nervous/anxious.     Blood pressure 133/60, pulse 72, temperature 98.2 F (36.8 C), temperature source Oral, resp. rate 18, height 6' (1.829 m), weight 105.7 kg, SpO2 100 %.Body mass index is 31.6 kg/m.  General  Appearance: Guarded  Eye Contact:  Minimal  Speech:  Blocked and Slow  Volume:  Normal  Mood:  Anxious  Affect:  Flat  Thought Process:  Disorganized  Orientation:  Full (Time, Place, and Person)  Thought Content:  Hallucinations: Auditory and Paranoid Ideation  Suicidal Thoughts:  No  Homicidal Thoughts:  No  Memory:  Immediate;   Poor Recent;   Poor Remote;   Poor  Judgement:  Impaired  Insight:  Shallow  Psychomotor Activity:  Decreased  Concentration:  Concentration: Poor and Attention Span: Poor  Recall:  Poor  Fund of Knowledge:  Poor  Language:  Poor  Akathisia:  Negative  Handed:  Right  AIMS (if indicated):     Assets:  Communication Skills Desire for Improvement Financial Resources/Insurance Housing Social Support  ADL's:  Impaired  Cognition:  Impaired,  Mild  Sleep:          Treatment Plan Summary: Daily contact with patient to assess and evaluate symptoms and progress in treatment and Medication management PLAN OF CARE: schizoaffective disorder, bipolar type -Continue depakote 500 mg BID, check level 06/18/20 -Restarted Clozaril 50 mg nightly, and will titrate back to home dose of 50 mg in the morning, 200 mg nightly -Administer Invega 234 mg IM injection today  Insomnia: -Restarted Restoril 15 mg daily at bedtime  Observation Level/Precautions:  15 minute checks  Laboratory:  HbAIC lipid panel  Psychotherapy:    Medications:    Consultations:    Discharge Concerns:    Estimated LOS:  Other:     Physician Treatment Plan for Primary Diagnosis: Schizoaffective disorder, bipolar type (HCC) Long Term Goal(s): Improvement in symptoms so as ready for discharge  Short Term Goals: Ability to identify changes in lifestyle to reduce recurrence of condition will improve, Ability to verbalize feelings will improve, Ability to disclose and discuss suicidal ideas, Ability to demonstrate self-control will improve, Ability to identify and develop effective  coping  behaviors will improve, Ability to maintain clinical measurements within normal limits will improve and Compliance with prescribed medications will improve  Physician Treatment Plan for Secondary Diagnosis: Principal Problem:   Schizoaffective disorder, bipolar type (HCC) Active Problems:   Hypertension  Long Term Goal(s): Improvement in symptoms so as ready for discharge  Short Term Goals: Ability to identify changes in lifestyle to reduce recurrence of condition will improve, Ability to verbalize feelings will improve, Ability to disclose and discuss suicidal ideas, Ability to demonstrate self-control will improve, Ability to identify and develop effective coping behaviors will improve, Ability to maintain clinical measurements within normal limits will improve and Compliance with prescribed medications will improve  I certify that inpatient services furnished can reasonably be expected to improve the patient's condition.    Jesse Sans, MD 11/24/20212:01 PM

## 2020-06-14 NOTE — [facility] Counselor (Signed)
CSW met with pt to complete an update to his PSA. After asking if he agreed to this pt responded yes. However, when asked further questions, pt stopped responding. Pt and CSW discussed whether he was able to participate at this point in time. Pt asked that CSW come back later. No other concerns expressed. Contact ended without incident.   Chalmers Guest. Guerry Bruin, MSW, LCSW, [place] 06/14/2020 1:11 PM

## 2020-06-14 NOTE — Tx Team (Signed)
Initial Treatment Plan 06/14/2020 3:47 AM [name] [name] LAG:[id number]    PATIENT STRESSORS: Marital or family conflict Medication change or noncompliance   PATIENT STRENGTHS: General fund of knowledge Motivation for treatment/growth   PATIENT IDENTIFIED PROBLEMS: Schizophrenia     Depression                  DISCHARGE CRITERIA:  Improved stabilization in mood, thinking, and/or behavior Motivation to continue treatment in a less acute level of care  PRELIMINARY DISCHARGE PLAN: Outpatient therapy Participate in family therapy  PATIENT/FAMILY INVOLVEMENT: This treatment plan has been presented to and reviewed with the patient, [name] [name],    The patient and family have been given the opportunity to ask questions and make suggestions.  Trula Ore, RN 06/14/2020, 3:47 AM

## 2020-06-14 NOTE — Tx Team (Addendum)
Interdisciplinary Treatment and Diagnostic Plan Update  06/14/2020 Time of Session: 0900 [name] [name] MRN: [id number]  Principal Diagnosis: <principal problem not specified>  Secondary Diagnoses: Active Problems:   Bipolar 1 disorder (HCC)   Current Medications:  Current Facility-Administered Medications  Medication Dose Route Frequency Provider Last Rate Last Admin   acetaminophen (TYLENOL) tablet 650 mg  650 mg Oral Q6H PRN Caroline Sauger, NP       alum & mag hydroxide-simeth (MAALOX/MYLANTA) 200-200-20 MG/5ML suspension 30 mL  30 mL Oral Q4H PRN Caroline Sauger, NP       amLODipine (NORVASC) tablet 5 mg  5 mg Oral Daily Caroline Sauger, NP   5 mg at 06/14/20 0900   cloZAPine (CLOZARIL) tablet 50 mg  50 mg Oral QHS Caroline Sauger, NP       divalproex (DEPAKOTE) DR tablet 500 mg  500 mg Oral Q12H Caroline Sauger, NP   500 mg at 06/14/20 0900   magnesium hydroxide (MILK OF MAGNESIA) suspension 30 mL  30 mL Oral Daily PRN Caroline Sauger, NP       temazepam (RESTORIL) capsule 15 mg  15 mg Oral QHS PRN Caroline Sauger, NP       PTA Medications: Medications Prior to Admission  Medication Sig Dispense Refill Last Dose   cloZAPine (CLOZARIL) 200 MG tablet Take 1 tablet (200 mg total) by mouth at bedtime. 30 tablet 1    divalproex (DEPAKOTE) 500 MG DR tablet Take 1 tablet (500 mg total) by mouth every 12 (twelve) hours. 60 tablet 1     Patient Stressors: Marital or family conflict Medication change or noncompliance  Patient Strengths: Technical sales engineer for treatment/growth  Treatment Modalities: Medication Management, Group therapy, Case management,  1 to 1 session with clinician, Psychoeducation, Recreational therapy.   Physician Treatment Plan for Primary Diagnosis: <principal problem not specified> Long Term Goal(s):     Short Term Goals:    Medication Management: Evaluate patient's response, side effects,  and tolerance of medication regimen.  Therapeutic Interventions: 1 to 1 sessions, Unit Group sessions and Medication administration.  Evaluation of Outcomes: Not Met  Physician Treatment Plan for Secondary Diagnosis: Active Problems:   Bipolar 1 disorder ([place])  Long Term Goal(s):     Short Term Goals:       Medication Management: Evaluate patient's response, side effects, and tolerance of medication regimen.  Therapeutic Interventions: 1 to 1 sessions, Unit Group sessions and Medication administration.  Evaluation of Outcomes: Not Met   RN Treatment Plan for Primary Diagnosis: <principal problem not specified> Long Term Goal(s): Knowledge of disease and therapeutic regimen to maintain health will improve  Short Term Goals: Ability to remain free from injury will improve, Ability to verbalize frustration and anger appropriately will improve, Ability to demonstrate self-control, Ability to participate in decision making will improve, Ability to verbalize feelings will improve, Ability to identify and develop effective coping behaviors will improve and Compliance with prescribed medications will improve  Medication Management: RN will administer medications as ordered by provider, will assess and evaluate patient's response and provide education to patient for prescribed medication. RN will report any adverse and/or side effects to prescribing provider.  Therapeutic Interventions: 1 on 1 counseling sessions, Psychoeducation, Medication administration, Evaluate responses to treatment, Monitor vital signs and CBGs as ordered, Perform/monitor CIWA, COWS, AIMS and Fall Risk screenings as ordered, Perform wound care treatments as ordered.  Evaluation of Outcomes: Not Met   LCSW Treatment Plan for Primary Diagnosis: <principal problem not  specified> Long Term Goal(s): Safe transition to appropriate next level of care at discharge, Engage patient in therapeutic group addressing interpersonal  concerns.  Short Term Goals: Engage patient in aftercare planning with referrals and resources, Increase ability to appropriately verbalize feelings, Increase emotional regulation and Facilitate acceptance of mental health diagnosis and concerns  Therapeutic Interventions: Assess for all discharge needs, 1 to 1 time with Social worker, Explore available resources and support systems, Assess for adequacy in community support network, Educate family and significant other(s) on suicide prevention, Complete Psychosocial Assessment, Interpersonal group therapy.  Evaluation of Outcomes: Not Met   Progress in Treatment: Attending groups: No. Participating in groups: No. Taking medication as prescribed: Yes. Toleration medication: Yes. Family/Significant other contact made: No, will contact:  CSW will obtain permission to contact collateral contact  Patient understands diagnosis: Yes. Discussing patient identified problems/goals with staff: No. Medical problems stabilized or resolved: No. Denies suicidal/homicidal ideation: No. Issues/concerns per patient self-inventory: No. Other: none  New problem(s) identified: No, Describe:  Patient declined Tx team.  New Short Term/Long Term Goal(s): elimination of symptoms of psychosis, medication management; development of comprehensive mental wellness/sobriety plan.   Patient Goals:  Patient declined to participate in Tx team meeting.   Discharge Plan or Barriers: Patient declined to participate in Tx team meeting.  CSW to follow-up at a later time.   Reason for Continuation of Hospitalization: Anxiety Delusions  Medication stabilization  Estimated Length of Stay:  Recreational Therapy: Patient Stressors: N/A Patient Goal: Patient will engage in groups without prompting or encouragement from LRT x3 group sessions within 5 recreation therapy group sessions.  Attendees: Patient: 06/14/2020 11:17 AM  Physician: Selina Cooley, MD 06/14/2020  11:17 AM  Nursing: Lyda Kalata, RN 06/14/2020 11:17 AM  RN Care Manager: 06/14/2020 11:17 AM  Social Worker: Georgeann Oppenheim, MSW, [place], [name]  06/14/2020 11:17 AM  Recreational Therapist: Devin Going, LRT  06/14/2020 11:17 AM  Other: Michell Heinrich, MSW, LCSW, LCAS  06/14/2020 11:17 AM  Other: Assunta Curtis, MSW, LCSW 06/14/2020 11:17 AM  Other: 06/14/2020 11:17 AM    Scribe for Treatment Team: Durenda Hurt, LCSWA 06/14/2020 11:17 AM

## 2020-06-14 NOTE — Progress Notes (Signed)
Patient calm and pleasant during assessment denying SI/HI/AVH. Patient presents with anxious affect. Patient isolative to his room but did come out for snack and medication. Patient compliant with medication administration per MD orders. Patient given education, support, and encouragement to be active in his treatment plan. Patient being monitored Q 15 minutes for safety per unit protocol. Pt remains safe on the unit. 

## 2020-06-14 NOTE — Progress Notes (Signed)
Recreation Therapy Notes  INPATIENT RECREATION TR PLAN  Patient Details Name: [name] [name] MRN: [id number] DOB: 03/01/1996 Today's Date: 06/14/2020  Rec Therapy Plan Is patient appropriate for Therapeutic Recreation?: Yes Treatment times per week: at least 3 Estimated Length of Stay: 5-7 days  Discharge Criteria    Discharge Summary     Oneika Simonian 06/14/2020, 1:29 PM

## 2020-06-14 NOTE — [facility] Suicide Risk Assessment (Signed)
[facility] Admission Suicide Risk Assessment   Nursing information obtained from:  Patient Demographic factors:  Male, Adolescent or young adult, Low socioeconomic status, Unemployed Current Mental Status:  NA Loss Factors:  Decrease in vocational status Historical Factors:  Impulsivity Risk Reduction Factors:  Religious beliefs about death, Living with another person, especially a relative, Positive social support  Total Time spent with patient: 30 minutes Principal Problem: Schizoaffective disorder, bipolar type (HCC) Diagnosis:  Principal Problem:   Schizoaffective disorder, bipolar type (HCC) Active Problems:   Hypertension  Subjective Data: [name] [name] an 24 y.o.malewho presents to the ER, after he was petitioned for IVC by his ACT Team, [employer] for acute psychosis. He has been off medications for approximately one month, and has become increasingly paranoid. Per the ACT Team, he stopped taking his medications and currently paranoid. When they went to visit him, he was afraid of them and hiding behind his mother. He's also afraid to be home alone and doesn't want his mother to going to work. Other times when they went to the house, the patient wouldn't let them in the home and told them, it was best for them and himself if they leave. Patient assessed one-on-one at bedside today. He is wrapped up in a blanket, but does pull the covers down to speak to provider. He appears paranoid and guarded, but dose answer questions. He is agreeable to restarting his medications in the hospital, and states that they are helpful.   Continued Clinical Symptoms:  Alcohol Use Disorder Identification Test Final Score (AUDIT): 1 The "Alcohol Use Disorders Identification Test", Guidelines for Use in Primary Care, Second Edition.  World Science writer [facility]). Score between 0-7:  no or low risk or alcohol related problems. Score between 8-15:  moderate risk of alcohol related problems. Score  between 16-19:  high risk of alcohol related problems. Score 20 or above:  warrants further diagnostic evaluation for alcohol dependence and treatment.   CLINICAL FACTORS:   Schizophrenia:   Command hallucinatons Paranoid or undifferentiated type Currently Psychotic Previous Psychiatric Diagnoses and Treatments Medical Diagnoses and Treatments/Surgeries   Musculoskeletal: Strength & Muscle Tone: within normal limits Gait & Station: normal Patient leans: N/A  Psychiatric Specialty Exam: Physical Exam Vitals and nursing note reviewed.  Constitutional:      Appearance: Normal appearance.  HENT:     Head: Normocephalic and atraumatic.     Right Ear: External ear normal.     Left Ear: External ear normal.     Nose: Nose normal.     Mouth/Throat:     Mouth: Mucous membranes are moist.     Pharynx: Oropharynx is clear.  Eyes:     Extraocular Movements: Extraocular movements intact.     Conjunctiva/sclera: Conjunctivae normal.     Pupils: Pupils are equal, round, and reactive to light.  Cardiovascular:     Rate and Rhythm: Normal rate.     Pulses: Normal pulses.  Pulmonary:     Effort: Pulmonary effort is normal.     Breath sounds: Normal breath sounds.  Abdominal:     General: Abdomen is flat.     Palpations: Abdomen is soft.  Musculoskeletal:        General: No swelling. Normal range of motion.     Cervical back: Normal range of motion and neck supple.  Skin:    General: Skin is warm and dry.  Neurological:     General: No focal deficit present.     Mental Status: He  is alert and oriented to person, place, and time.  Psychiatric:        Attention and Perception: He is inattentive. He perceives auditory hallucinations.        Mood and Affect: Mood is anxious. Affect is flat.        Speech: Speech is delayed.        Behavior: Behavior is slowed and withdrawn.        Thought Content: Thought content is paranoid and delusional.        Cognition and Memory: Cognition is  impaired. Memory is impaired.        Judgment: Judgment is impulsive.     Review of Systems  Constitutional: Negative for diaphoresis and fatigue.  HENT: Negative for rhinorrhea and sore throat.   Eyes: Negative for photophobia and visual disturbance.  Respiratory: Negative for cough and shortness of breath.   Cardiovascular: Negative for chest pain and palpitations.  Gastrointestinal: Negative for constipation, diarrhea, nausea and vomiting.  Endocrine: Negative for cold intolerance and heat intolerance.  Genitourinary: Negative for difficulty urinating and dysuria.  Musculoskeletal: Negative for arthralgias and back pain.  Skin: Negative for rash and wound.  Allergic/Immunologic: Negative for environmental allergies and food allergies.  Neurological: Negative for dizziness and headaches.  Hematological: Negative for adenopathy. Does not bruise/bleed easily.  Psychiatric/Behavioral: Positive for behavioral problems, hallucinations and sleep disturbance. The patient is nervous/anxious.     Blood pressure 133/60, pulse 72, temperature 98.2 F (36.8 C), temperature source Oral, resp. rate 18, height 6' (1.829 m), weight 105.7 kg, SpO2 100 %.Body mass index is 31.6 kg/m.  General Appearance: Guarded  Eye Contact:  Minimal  Speech:  Blocked and Slow  Volume:  Normal  Mood:  Anxious  Affect:  Flat  Thought Process:  Disorganized  Orientation:  Full (Time, Place, and Person)  Thought Content:  Hallucinations: Auditory and Paranoid Ideation  Suicidal Thoughts:  No  Homicidal Thoughts:  No  Memory:  Immediate;   Poor Recent;   Poor Remote;   Poor  Judgement:  Impaired  Insight:  Shallow  Psychomotor Activity:  Decreased  Concentration:  Concentration: Poor and Attention Span: Poor  Recall:  Poor  Fund of Knowledge:  Poor  Language:  Poor  Akathisia:  Negative  Handed:  Right  AIMS (if indicated):     Assets:  Communication Skills Desire for Improvement Financial  Resources/Insurance Housing Social Support  ADL's:  Impaired  Cognition:  Impaired,  Mild  Sleep:         COGNITIVE FEATURES THAT CONTRIBUTE TO RISK:  Loss of executive function    SUICIDE RISK:   Mild:  Suicidal ideation of limited frequency, intensity, duration, and specificity.  There are no identifiable plans, no associated intent, mild dysphoria and related symptoms, good self-control (both objective and subjective assessment), few other risk factors, and identifiable protective factors, including available and accessible social support.  PLAN OF CARE: Daily contact with patient to assess and evaluate symptoms and progress in treatment, Medication management and Plan schizoaffective disorder, bipolar type -Continue depakote 500 mg BID, check level 06/18/20 -Restarted Clozaril 50 mg nightly, and will titrate back to home dose of 50 mg in the morning, 200 mg nightly -Administer Invega 234 mg IM injection today  Insomnia: -Restarted Restoril 15 mg daily at bedtime  I certify that inpatient services furnished can reasonably be expected to improve the patient's condition.   Jesse Sans, MD 06/14/2020, 1:53 PM

## 2020-06-14 NOTE — Progress Notes (Signed)
Recreation Therapy Notes  INPATIENT RECREATION THERAPY ASSESSMENT  Patient Details Name: [name] [name] MRN: [id number] DOB: 1996-04-05 Today's Date: 06/14/2020       Information Obtained From: Patient (Patient refused)  Able to Participate in Assessment/Interview:    Patient Presentation:    Reason for Admission (Per Patient):    Patient Stressors:    Coping Skills:      Leisure Interests (2+):     Frequency of Recreation/Participation:    Awareness of Community Resources:     [employer]:     Current Use:    If no, Barriers?:    Expressed Interest in [employer] Information:    [place] of Residence:     Patient Main Form of Transportation:    Patient Strengths:     Patient Identified Areas of Improvement:     Patient Goal for Hospitalization:     Current SI (including self-harm):     Current HI:     Current AVH:    Staff Intervention Plan:    Consent to Intern Participation:    Monetta Lick 06/14/2020, 1:28 PM

## 2020-06-15 DIAGNOSIS — F250 Schizoaffective disorder, bipolar type: Secondary | ICD-10-CM | POA: Diagnosis not present

## 2020-06-15 MED ORDER — CLOZAPINE 100 MG PO TABS
100.0000 mg | ORAL_TABLET | Freq: Every day | ORAL | Status: DC
  Administered 2020-06-15 – 2020-06-18 (×4): 100 mg via ORAL
  Filled 2020-06-15 (×4): qty 1

## 2020-06-15 NOTE — Progress Notes (Signed)
Patient refused morning vitals, stating "it's boring". This Clinical research associate explained the importance of getting his vital signs. This Clinical research associate asked patient if he would be willing to let staff obtain a set later, around lunch time, and he replied "yeah".

## 2020-06-15 NOTE — Progress Notes (Signed)
Pharmacy - Clozapine     This patient's order has been reviewed for prescribing contraindications.   Labs:  ANC 6500  The medication is being dispensed pursuant to the FDA REMS suspension order of 06/09/20 that allows for dispensing without a patient REMS dispense authorization (RDA).   Will need weekly CBC w/ differential to monitor ANC.  Clovia Cuff, PharmD, BCPS 06/15/2020 7:56 AM

## 2020-06-15 NOTE — Progress Notes (Signed)
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  Patient is staying isolated to himself which is his usual habit.  Mostly stays in bed.  He is taking his medicine.  Slept well last night.  Has no complaints.  Not aggressive or agitated or threatening.  Blood pressure improved. Principal Problem: Schizoaffective disorder, bipolar type (HCC) Diagnosis: Principal Problem:   Schizoaffective disorder, bipolar type (HCC) Active Problems:   Hypertension  Total Time spent with patient: 30 minutes  Past Psychiatric History: Past history of recurrent episodes of psychosis multiple prior hospitalizations problems with insight and compliance  Past Medical History:  Past Medical History:  Diagnosis Date  . Asthma   . Depression   . Psychosis (HCC)   . Schizoaffective disorder [facility])     Past Surgical History:  Procedure Laterality Date  . BACK SURGERY    . I&D groin  2017   Family History: History reviewed. No pertinent family history. Family Psychiatric  History: See previous Social History:  Social History   Substance and Sexual Activity  Alcohol Use No     Social History   Substance and Sexual Activity  Drug Use Never    Social History   Socioeconomic History  . Marital status: Single    Spouse name: Not on file  . Number of children: Not on file  . Years of education: Not on file  . Highest education level: Not on file  Occupational History  . Not on file  Tobacco Use  . Smoking status: Never Smoker  . Smokeless tobacco: Never Used  Vaping Use  . Vaping Use: Never used  Substance and Sexual Activity  . Alcohol use: No  . Drug use: Never  . Sexual activity: Not Currently  Other Topics Concern  . Not on file  Social History Narrative  . Not on file   Social Determinants of Health   Financial Resource Strain:   . Difficulty of Paying Living Expenses: Not on file   Food Insecurity:   . Worried About Programme researcher, broadcasting/film/video in the Last Year: Not on file  . Ran Out of Food in the Last Year: Not on file  Transportation Needs:   . Lack of Transportation (Medical): Not on file  . Lack of Transportation (Non-Medical): Not on file  Physical Activity:   . Days of Exercise per Week: Not on file  . Minutes of Exercise per Session: Not on file  Stress:   . Feeling of Stress : Not on file  Social Connections:   . Frequency of Communication with Friends and Family: Not on file  . Frequency of Social Gatherings with Friends and Family: Not on file  . Attends Religious Services: Not on file  . Active Member of Clubs or Organizations: Not on file  . Attends Banker Meetings: Not on file  . Marital Status: Not on file   Additional Social History:                         Sleep: Fair  Appetite:  Fair  Current Medications: Current Facility-Administered Medications  Medication Dose Route Frequency Provider Last Rate Last Admin  . acetaminophen (TYLENOL) tablet 650 mg  650 mg Oral Q6H PRN Gillermo Murdoch, NP      . alum & mag hydroxide-simeth (MAALOX/MYLANTA) 200-200-20 MG/5ML suspension 30 mL  30 mL Oral Q4H PRN Janee Morn,  Adela Lank, NP      . amLODipine (NORVASC) tablet 5 mg  5 mg Oral Daily Gillermo Murdoch, NP   5 mg at 06/14/20 0900  . cloZAPine (CLOZARIL) tablet 100 mg  100 mg Oral QHS Madolyn Ackroyd T, MD      . divalproex (DEPAKOTE) DR tablet 500 mg  500 mg Oral Q12H Gillermo Murdoch, NP   500 mg at 06/14/20 2104  . magnesium hydroxide (MILK OF MAGNESIA) suspension 30 mL  30 mL Oral Daily PRN Gillermo Murdoch, NP      . temazepam (RESTORIL) capsule 15 mg  15 mg Oral QHS PRN Gillermo Murdoch, NP   15 mg at 06/14/20 2104    Lab Results:  Results for orders placed or performed during the hospital encounter of 06/13/20 (from the past 48 hour(s))  Comprehensive metabolic panel     Status: Abnormal   Collection  Time: 06/13/20  1:18 PM  Result Value Ref Range   Sodium 136 135 - 145 mmol/L   Potassium 3.7 3.5 - 5.1 mmol/L   Chloride 102 98 - 111 mmol/L   CO2 23 22 - 32 mmol/L   Glucose, Bld 119 (H) 70 - 99 mg/dL    Comment: Glucose reference range applies only to samples taken after fasting for at least 8 hours.   BUN 11 6 - 20 mg/dL   Creatinine, Ser 3.79 0.61 - 1.24 mg/dL   Calcium 9.7 8.9 - 02.4 mg/dL   Total Protein 7.8 6.5 - 8.1 g/dL   Albumin 4.6 3.5 - 5.0 g/dL   AST 29 15 - 41 U/L   ALT 39 0 - 44 U/L   Alkaline Phosphatase 82 38 - 126 U/L   Total Bilirubin 1.2 0.3 - 1.2 mg/dL   GFR, Estimated >09 >73 mL/min    Comment: (NOTE) Calculated using the CKD-EPI Creatinine Equation (2021)    Anion gap 11 5 - 15    Comment: Performed at [facility], [address] [place], [place] [address]  Ethanol     Status: None   Collection Time: 06/13/20  1:18 PM  Result Value Ref Range   Alcohol, Ethyl (B) <10 <10 mg/dL    Comment: (NOTE) Lowest detectable limit for serum alcohol is 10 mg/dL.  For medical purposes only. Performed at [facility], [address] [place], [place] [address]   Salicylate level     Status: Abnormal   Collection Time: 06/13/20  1:18 PM  Result Value Ref Range   Salicylate Lvl <7.0 (L) 7.0 - 30.0 mg/dL    Comment: Performed at [facility], [address] [place], [place] [address]  Acetaminophen level     Status: Abnormal   Collection Time: 06/13/20  1:18 PM  Result Value Ref Range   Acetaminophen (Tylenol), Serum <10 (L) 10 - 30 ug/mL    Comment: (NOTE) Therapeutic concentrations vary significantly. A range of 10-30 ug/mL  may be an effective concentration for many patients. However, some  are best treated at concentrations outside of this range. Acetaminophen concentrations >150 ug/mL at 4 hours after ingestion  and >50 ug/mL at 12 hours after ingestion are often associated with  toxic reactions.  Performed at [facility], [address] [place], [place] [address]   cbc     Status: None   Collection Time: 06/13/20  1:18 PM  Result Value Ref Range   WBC 5.3 4.0 - 10.5 K/uL   RBC 5.69 4.22 - 5.81 MIL/uL  Hemoglobin 16.2 13.0 - 17.0 g/dL   HCT 16.1 39 - 52 %   MCV 80.5 80.0 - 100.0 fL   MCH 28.5 26.0 - 34.0 pg   MCHC 35.4 30.0 - 36.0 g/dL   RDW 09.6 04.5 - 40.9 %   Platelets 286 150 - 400 K/uL   nRBC 0.0 0.0 - 0.2 %    Comment: Performed at [facility], [address] [place], [place] [address]  Urine Drug Screen, Qualitative     Status: Abnormal   Collection Time: 06/13/20  1:18 PM  Result Value Ref Range   Tricyclic, Ur Screen NONE DETECTED NONE DETECTED   Amphetamines, Ur Screen NONE DETECTED NONE DETECTED   MDMA (Ecstasy)Ur Screen NONE DETECTED NONE DETECTED   Cocaine Metabolite,Ur [place] NONE DETECTED NONE DETECTED   Opiate, Ur Screen NONE DETECTED NONE DETECTED   Phencyclidine (PCP) Ur S NONE DETECTED NONE DETECTED   Cannabinoid 50 Ng, Ur [place] POSITIVE (A) NONE DETECTED   Barbiturates, Ur Screen NONE DETECTED NONE DETECTED   Benzodiazepine, Ur Scrn NONE DETECTED NONE DETECTED   Methadone Scn, Ur NONE DETECTED NONE DETECTED    Comment: (NOTE) Tricyclics + metabolites, urine    Cutoff 1000 ng/mL Amphetamines + metabolites, urine  Cutoff 1000 ng/mL MDMA (Ecstasy), urine              Cutoff 500 ng/mL Cocaine Metabolite, urine          Cutoff 300 ng/mL Opiate + metabolites, urine        Cutoff 300 ng/mL Phencyclidine (PCP), urine         Cutoff 25 ng/mL Cannabinoid, urine                 Cutoff 50 ng/mL Barbiturates + metabolites, urine  Cutoff 200 ng/mL Benzodiazepine, urine              Cutoff 200 ng/mL Methadone, urine                   Cutoff 300 ng/mL  The urine drug screen provides only a preliminary, unconfirmed analytical test result and should not be used for non-medical purposes. Clinical consideration and professional judgment should be applied to any  positive drug screen result due to possible interfering substances. A more specific alternate chemical method must be used in order to obtain a confirmed analytical result. Gas chromatography / mass spectrometry (GC/MS) is the preferred confirm atory method. Performed at [facility], [address] [place], [place] [address]   CBC with Differential/Platelet     Status: None   Collection Time: 06/13/20  1:18 PM  Result Value Ref Range   WBC 5.3 4.0 - 10.5 K/uL   RBC 5.71 4.22 - 5.81 MIL/uL   Hemoglobin 16.3 13.0 - 17.0 g/dL   HCT 56.2 39 - 52 %   MCV 82.5 80.0 - 100.0 fL   MCH 28.5 26.0 - 34.0 pg   MCHC 34.6 30.0 - 36.0 g/dL   RDW 13.0 86.5 - 78.4 %   Platelets 309 150 - 400 K/uL   nRBC 0.0 0.0 - 0.2 %   Neutrophils Relative % 65 %   Neutro Abs 3.4 1.7 - 7.7 K/uL   Lymphocytes Relative 26 %   Lymphs Abs 1.4 0.7 - 4.0 K/uL   Monocytes Relative 9 %   Monocytes Absolute 0.5 0.1 - 1.0 K/uL   Eosinophils Relative 0 %   Eosinophils Absolute 0.0 0.0 - 0.5 K/uL   Basophils  Relative 0 %   Basophils Absolute 0.0 0.0 - 0.1 K/uL   Immature Granulocytes 0 %   Abs Immature Granulocytes 0.02 0.00 - 0.07 K/uL    Comment: Performed at [facility], [address] [place], [place] [address]  Resp Panel by RT-PCR (Flu A&B, Covid) Nasopharyngeal Swab     Status: None   Collection Time: 06/13/20  2:58 PM   Specimen: Nasopharyngeal Swab; Nasopharyngeal(NP) swabs in vial transport medium  Result Value Ref Range   SARS Coronavirus 2 by RT PCR NEGATIVE NEGATIVE    Comment: (NOTE) SARS-CoV-2 target nucleic acids are NOT DETECTED.  The SARS-CoV-2 RNA is generally detectable in upper respiratory specimens during the acute phase of infection. The lowest concentration of SARS-CoV-2 viral copies this assay can detect is 138 copies/mL. A negative result does not preclude SARS-Cov-2 infection and should not be used as the sole basis for treatment or other patient management  decisions. A negative result may occur with  improper specimen collection/handling, submission of specimen other than nasopharyngeal swab, presence of viral mutation(s) within the areas targeted by this assay, and inadequate number of viral copies(<138 copies/mL). A negative result must be combined with clinical observations, patient history, and epidemiological information. The expected result is Negative.  Fact Sheet for Patients:  BloggerCourse.comhttps://www.fda.gov/media/152166/download  Fact Sheet for Healthcare Providers:  SeriousBroker.ithttps://www.fda.gov/media/152162/download  This test is no t yet approved or cleared by the [place] FDA and  has been authorized for detection and/or diagnosis of SARS-CoV-2 by FDA under an Emergency Use Authorization (EUA). This EUA will remain  in effect (meaning this test can be used) for the duration of the COVID-19 declaration under Section 564(b)(1) of the Act, 21 U.S.C.section 360bbb-3(b)(1), unless the authorization is terminated  or revoked sooner.       Influenza A by PCR NEGATIVE NEGATIVE   Influenza B by PCR NEGATIVE NEGATIVE    Comment: (NOTE) The Xpert Xpress SARS-CoV-2/FLU/RSV plus assay is intended as an aid in the diagnosis of influenza from Nasopharyngeal swab specimens and should not be used as a sole basis for treatment. Nasal washings and aspirates are unacceptable for Xpert Xpress SARS-CoV-2/FLU/RSV testing.  Fact Sheet for Patients: BloggerCourse.comhttps://www.fda.gov/media/152166/download  Fact Sheet for Healthcare Providers: SeriousBroker.ithttps://www.fda.gov/media/152162/download  This test is not yet approved or cleared by the [place] FDA and has been authorized for detection and/or diagnosis of SARS-CoV-2 by FDA under an Emergency Use Authorization (EUA). This EUA will remain in effect (meaning this test can be used) for the duration of the COVID-19 declaration under Section 564(b)(1) of the Act, 21 U.S.C. section 360bbb-3(b)(1), unless the authorization  is terminated or revoked.  Performed at [facility], [address] [place], [place] [address]     Blood Alcohol level:  Lab Results  Component Value Date   [facility]10 06/13/2020   ETH <10 08/14/2019    Metabolic Disorder Labs: Lab Results  Component Value Date   HGBA1C 5.1 08/10/2018   MPG 99.67 08/10/2018   MPG 93.93 03/08/2018   No results found for: PROLACTIN Lab Results  Component Value Date   CHOL 166 05/05/2019   TRIG 117 05/05/2019   HDL 47 05/05/2019   CHOLHDL 3.5 05/05/2019   VLDL 23 05/05/2019   LDLCALC 96 05/05/2019   LDLCALC 108 (H) 08/10/2018    Physical Findings: AIMS: Facial and Oral Movements Muscles of Facial Expression: None, normal Lips and Perioral Area: None, normal Jaw: None, normal Tongue: None, normal,Extremity Movements Upper (arms, wrists, hands, fingers): None,  normal Lower (legs, knees, ankles, toes): None, normal, Trunk Movements Neck, shoulders, hips: None, normal, Overall Severity Severity of abnormal movements (highest score from questions above): None, normal Incapacitation due to abnormal movements: None, normal Patient's awareness of abnormal movements (rate only patient's report): No Awareness, Dental Status Current problems with teeth and/or dentures?: No Does patient usually wear dentures?: No  CIWA:  CIWA-Ar Total: 0 COWS:  COWS Total Score: 1  Musculoskeletal: Strength & Muscle Tone: within normal limits Gait & Station: normal Patient leans: N/A  Psychiatric Specialty Exam: Physical Exam Vitals and nursing note reviewed.  Constitutional:      Appearance: He is well-developed.  HENT:     Head: Normocephalic and atraumatic.  Eyes:     Conjunctiva/sclera: Conjunctivae normal.     Pupils: Pupils are equal, round, and reactive to light.  Cardiovascular:     Heart sounds: Normal heart sounds.  Pulmonary:     Effort: Pulmonary effort is normal.  Abdominal:     Palpations: Abdomen is soft.   Musculoskeletal:        General: Normal range of motion.     Cervical back: Normal range of motion.  Skin:    General: Skin is warm and dry.  Neurological:     General: No focal deficit present.     Mental Status: He is alert.  Psychiatric:        Attention and Perception: He is inattentive.        Mood and Affect: Affect is blunt.        Speech: He is noncommunicative.        Behavior: Behavior is slowed and withdrawn.        Thought Content: Thought content does not include homicidal or suicidal ideation.        Cognition and Memory: Cognition is impaired.        Judgment: Judgment is impulsive.     Review of Systems  Constitutional: Negative.   HENT: Negative.   Eyes: Negative.   Respiratory: Negative.   Cardiovascular: Negative.   Gastrointestinal: Negative.   Musculoskeletal: Negative.   Skin: Negative.   Neurological: Negative.   Psychiatric/Behavioral: Positive for decreased concentration.    Blood pressure 133/60, pulse 72, temperature 98.2 F (36.8 C), temperature source Oral, resp. rate 18, height 6' (1.829 m), weight 105.7 kg, SpO2 100 %.Body mass index is 31.6 kg/m.  General Appearance: Casual  Eye Contact:  Minimal  Speech:  Slow  Volume:  Decreased  Mood:  Euthymic  Affect:  Constricted  Thought Process:  Coherent  Orientation:  Full (Time, Place, and Person)  Thought Content:  Paranoid Ideation, Rumination and Tangential  Suicidal Thoughts:  No  Homicidal Thoughts:  No  Memory:  Immediate;   Fair Recent;   Poor Remote;   Fair  Judgement:  Impaired  Insight:  Shallow  Psychomotor Activity:  Decreased  Concentration:  Concentration: Poor  Recall:  [employer] of Knowledge:  Fair  Language:  Fair  Akathisia:  No  Handed:  Right  AIMS (if indicated):     Assets:  Desire for Improvement Housing Resilience  ADL's:  Impaired  Cognition:  Impaired,  Mild  Sleep:  Number of Hours: 8     Treatment Plan Summary: Daily contact with patient to  assess and evaluate symptoms and progress in treatment, Medication management and Plan In line with plan to retitrate clozapine will increase dose to 100 mg tonight.  Defer starting morning dose yet.  Continue other  medicine.  Encourage patient to consider getting up getting out of bed and interacting with others.  No other change to treatment plan today.  Mordecai Rasmussen, MD 06/15/2020, 7:23 AM

## 2020-06-15 NOTE — [facility] Suicide Risk Assessment (Signed)
[facility] INPATIENT:  Family/Significant Other Suicide Prevention Education  Suicide Prevention Education:  Patient Refusal for Family/Significant Other Suicide Prevention Education: The patient [name] [name] has refused to provide written consent for family/significant other to be provided Family/Significant Other Suicide Prevention Education during admission and/or prior to discharge.  Physician notified.  SPE completed with pt, as pt refused to consent to family contact. SPI pamphlet provided to pt and pt was encouraged to share information with support network, ask questions, and talk about any concerns relating to SPE. Pt denies access to guns/firearms and verbalized understanding of information provided. Mobile Crisis information also provided to pt.   Harden Mo 06/15/2020, 9:52 AM

## 2020-06-15 NOTE — [facility] Counselor (Signed)
Adult Comprehensive Assessment  Patient ID: [name] [name], male   DOB: 1995-11-22, 24 y.o.   MRN: [id number]  Information Source: Information source: Patient   Current Stressors:  Patient states their primary concerns and needs for treatment are:: Pt reports "my doctor thought I needed to"  Patient states their goals for this hospitilization and ongoing recovery are:: Pt reports "I don't have a goal."  Educational / Learning stressors: Pt denies. Employment / Job issues: Pt denies. Family Relationships: Pt denies. Financial / Lack of resources (include bankruptcy): Pt denies. Housing / Lack of housing: Pt denies. Physical health (include injuries & life threatening diseases): Pt denies. Social relationships: Pt denies. Substance abuse: Pt denies. Bereavement / Loss: Pt denies.   Living/Environment/Situation:  Living Arrangements: Parent/Other Relatives Who else lives in the home?: "my mom and brother" How long has patient lived in current situation?: Pt reports "over 12 years". What is atmosphere in current home: Comfortable, Supportive   Family History:  Marital status: Single Are you sexually active?: No What is your sexual orientation?: Heterosexual Has your sexual activity been affected by drugs, alcohol, medication, or emotional stress?: No Does patient have children?: No   Childhood History:  By whom was/is the patient raised?: Mother Additional childhood history information: All of pt's family lives in [place], [place].  It has just been him, his mother, and younger brother living in the [place] Description of patient's relationship with caregiver when they were a child: Pt reports "I got along with my mother". Patient's description of current relationship with people who raised him/her: Pt reports "I love  her.  It's nice."   How were you disciplined when you got in trouble as a child/adolescent?: Pt reports "if I got into trouble  I had to listen to her speak or maybe I  couldn't play videogames."  Does patient have siblings?: Yes Number of Siblings: 1 Description of patient's current relationship with siblings: Pt reports "he's nice". Did patient suffer any verbal/emotional/physical/sexual abuse as a child?: No Did patient suffer from severe childhood neglect?: No Has patient ever been sexually abused/assaulted/raped as an adolescent or adult?: No Was the patient ever a victim of a crime or a disaster?: No Witnessed domestic violence?: No Has patient been effected by domestic violence as an adult?: No   Education:  Highest grade of school patient has completed: 10th Currently a student?: No Learning disability?: No   Employment/Work Situation:   Employment situation: Unemployed Did You Receive Any Psychiatric Treatment/Services While in Equities trader?: No(NA) Are There Guns or Other Weapons in Your Home?: No   Financial Resources:   Surveyor, quantity resources: Medicaid, Support from parent's/caregivers Does patient have a Lawyer or guardian?: No   Alcohol/Substance Abuse:   What has been your use of drugs/alcohol within the last 12 months?: Pt denies. If attempted suicide, did drugs/alcohol play a role in this?: No Alcohol/Substance Abuse Treatment Hx: Denies past history Has alcohol/substance abuse ever caused legal problems?: No   Social Support System:   Patient's Community Support System: Fair Describe Community Support System: Pt reports "my brother, brother and friends". Type of faith/religion: Pt reports "I don't feel comfortable answering this question".   Leisure/Recreation:   Leisure and Hobbies: Pt reports "watching TV".   Strengths/Needs:   What is the patient's perception of their strengths?: Pt reports "I don't know". Patient states they can use these personal strengths during their treatment to contribute to their recovery: Pt denies. Patient states these barriers may affect/interfere with their  treatment: Pt  denies. Patient states these barriers may affect their return to the community: Pt denies.   Discharge Plan:   Currently receiving community mental health services: Yes (From Whom)([facility]) Patient states concerns and preferences for aftercare planning are: Pt reports that he has an ACT team with [facility].  Does patient have access to transportation?: Yes Does patient have financial barriers related to discharge medications?: No Will patient be returning to same living situation after discharge?: Yes  Summary/Recommendations:   Summary and Recommendations (to be completed by the evaluator): Patient is a 24 year old male [place], [place] [facility] He reports that he is currently unemployed and lives with his mother and brother.  He presents to the hospital under IVC by his ACTT team following reports that patient has not taken his medication for a month.  He has a primary diagnosis of Schizoaffective Disorder, Bipolar Type.  Recommendations include: crisis stabilization, therapeutic milieu, encourage group attendance and participation, medication management for detox/mood stabilization and development of comprehensive mental wellness/sobriety plan.  Harden Mo. 06/15/2020

## 2020-06-15 NOTE — Plan of Care (Signed)
Patient compliant with medication administration per MD orders  Problem: Health Behavior/Discharge Planning: Goal: Compliance with prescribed medication regimen will improve Outcome: Progressing   

## 2020-06-15 NOTE — Progress Notes (Signed)
Patient calm and pleasant during assessment denying SI/HI/AVH. Patient presents with anxious affect. Patient isolative to his room but did come out for snack and medication. Patient compliant with medication administration per MD orders. Patient given education, support, and encouragement to be active in his treatment plan. Patient being monitored Q 15 minutes for safety per unit protocol. Pt remains safe on the unit.

## 2020-06-15 NOTE — Plan of Care (Signed)
D- Patient alert and oriented. Patient presented in a pleasant mood on assessment stating that he slept ok last night and had no complaints to voice to this Clinical research associate. Patient denied SI, HI, AVH, and pain at this time. Patient also denies any signs/symptoms of depression and anxiety, reporting that overall he is feeling "ok". Patient had no stated goals for today. Patient continues to isolate, but he does come out for meals and medication. Patient has been reluctant to have his vitals taken, however, he did eventually come around and cooperate with this Clinical research associate.  A- Scheduled medications administered to patient, per MD orders. Support and encouragement provided.  Routine safety checks conducted every 15 minutes.  Patient informed to notify staff with problems or concerns.  R- No adverse drug reactions noted. Patient contracts for safety at this time. Patient compliant with medications. Patient receptive, calm, and cooperative. Patient interacts well with others on the unit.  Patient remains safe at this time.  Problem: Consults Goal: Concurrent Medical Patient Education Description: (See Patient Education Module for education specifics) Outcome: Progressing   Problem: [facility] Concurrent Medical Problem Goal: LTG-Pt will be physically stable and he/significant other Description: (Patient will be physically stable and he/significant other will be able to verbalize understanding of follow-up care and symptoms that would warrant further treatment) Outcome: Progressing Goal: STG-Vital signs will be within defined limits or stabilized Description: (STG- Vital signs will be within defined limits or stabilized for individual) Outcome: Progressing Goal: STG-Compliance with medication and/or treatment as ordered Description: (STG-Compliance with medication and/or treatment as ordered by MD) Outcome: Progressing Goal: STG-Verbalize two symptoms that would warrant further Description: (STG-Verbalize two symptoms  that would warrant further treatment) Outcome: Progressing Goal: STG-Patient will participate in management/stabilization Description: (STG-Patient will participate in management/stabilization of medical condition) Outcome: Progressing Goal: STG-Other (Specify): Description: STG-Other Concurrent Medical (Specify): Outcome: Progressing   Problem: Activity: Goal: Will verbalize the importance of balancing activity with adequate rest periods Outcome: Progressing   Problem: Education: Goal: Will be free of psychotic symptoms Outcome: Progressing Goal: Knowledge of the prescribed therapeutic regimen will improve Outcome: Progressing   Problem: Coping: Goal: Coping ability will improve Outcome: Progressing Goal: Will verbalize feelings Outcome: Progressing   Problem: Health Behavior/Discharge Planning: Goal: Compliance with prescribed medication regimen will improve Outcome: Progressing   Problem: Nutritional: Goal: Ability to achieve adequate nutritional intake will improve Outcome: Progressing   Problem: Role Relationship: Goal: Ability to communicate needs accurately will improve Outcome: Progressing Goal: Ability to interact with others will improve Outcome: Progressing   Problem: Safety: Goal: Ability to redirect hostility and anger into socially appropriate behaviors will improve Outcome: Progressing Goal: Ability to remain free from injury will improve Outcome: Progressing   Problem: Self-Care: Goal: Ability to participate in self-care as condition permits will improve Outcome: Progressing   Problem: Self-Concept: Goal: Will verbalize positive feelings about self Outcome: Progressing

## 2020-06-16 NOTE — Plan of Care (Signed)
D- Patient alert and oriented. Patient presents in a pleasant mood on assessment stating that he slept good last night and had no complaints to voice to this Clinical research associate. Patient denies SI, HI, AVH, and pain at this time. Patient also denies any signs/symptoms of depression and anxiety, reporting that he is feeling "good" overall. Patient has no stated goals for today. Patient continues to isolate his room, however, he does come out periodically, and towards the end of the shift he starts to pace the unit.  A- Scheduled medications administered to patient, per MD orders. Support and encouragement provided.  Routine safety checks conducted every 15 minutes.  Patient informed to notify staff with problems or concerns.  R- No adverse drug reactions noted. Patient contracts for safety at this time. Patient compliant with medications. Patient receptive, calm, and cooperative. Patient interacts well with others on the unit.  Patient remains safe at this time.  Problem: Consults Goal: Concurrent Medical Patient Education Description: (See Patient Education Module for education specifics) Outcome: Progressing   Problem: [facility] Concurrent Medical Problem Goal: LTG-Pt will be physically stable and he/significant other Description: (Patient will be physically stable and he/significant other will be able to verbalize understanding of follow-up care and symptoms that would warrant further treatment) Outcome: Progressing Goal: STG-Vital signs will be within defined limits or stabilized Description: (STG- Vital signs will be within defined limits or stabilized for individual) Outcome: Progressing Goal: STG-Compliance with medication and/or treatment as ordered Description: (STG-Compliance with medication and/or treatment as ordered by MD) Outcome: Progressing Goal: STG-Verbalize two symptoms that would warrant further Description: (STG-Verbalize two symptoms that would warrant further treatment) Outcome:  Progressing Goal: STG-Patient will participate in management/stabilization Description: (STG-Patient will participate in management/stabilization of medical condition) Outcome: Progressing Goal: STG-Other (Specify): Description: STG-Other Concurrent Medical (Specify): Outcome: Progressing   Problem: Activity: Goal: Will verbalize the importance of balancing activity with adequate rest periods Outcome: Progressing   Problem: Education: Goal: Will be free of psychotic symptoms Outcome: Progressing Goal: Knowledge of the prescribed therapeutic regimen will improve Outcome: Progressing   Problem: Coping: Goal: Coping ability will improve Outcome: Progressing Goal: Will verbalize feelings Outcome: Progressing   Problem: Health Behavior/Discharge Planning: Goal: Compliance with prescribed medication regimen will improve Outcome: Progressing   Problem: Nutritional: Goal: Ability to achieve adequate nutritional intake will improve Outcome: Progressing   Problem: Role Relationship: Goal: Ability to communicate needs accurately will improve Outcome: Progressing Goal: Ability to interact with others will improve Outcome: Progressing   Problem: Safety: Goal: Ability to redirect hostility and anger into socially appropriate behaviors will improve Outcome: Progressing Goal: Ability to remain free from injury will improve Outcome: Progressing   Problem: Self-Care: Goal: Ability to participate in self-care as condition permits will improve Outcome: Progressing   Problem: Self-Concept: Goal: Will verbalize positive feelings about self Outcome: Progressing

## 2020-06-16 NOTE — Progress Notes (Signed)
Recreation Therapy Notes  Date: 06/16/2020   Time: 9:30 am   Location: Craft room     Behavioral response: N/A   Intervention Topic: Values  Discussion/Intervention: Patient did not attend group.   Clinical Observations/Feedback:  Patient did not attend group.   Zaineb Nowaczyk LRT/CTRS        Anahlia Iseminger 06/16/2020 10:45 AM 

## 2020-06-16 NOTE — Progress Notes (Addendum)
[facility] MD Progress Note  06/16/2020 11:53 AM [name] [name]  MRN:  [id number] Subjective:  [name] [name] was seen for initial psychiatric evaluation on June 14, 2020.  He was petition for IVC by his ACT team for acute psychosis.  Today, he offers limited information.  Denies any concerns in terms of depression anxiety or irritability.  Denies having auditory, visual, and olfactory hallucinations.  Denies delusions.  Says that he sleeping well.  No new medical problems.  By staff and nursing, patient is described to be at his baseline.  He does tend to isolate in his room.  He will occasionally come out and pace the halls. Principal Problem: Schizoaffective disorder, bipolar type (HCC) Diagnosis: Principal Problem:   Schizoaffective disorder, bipolar type (HCC) Active Problems:   Hypertension  Total Time spent with patient: 30 minutes  Past Psychiatric History: Past history of recurrent episodes of psychosis multiple prior hospitalizations problems with insight and compliance  Past Medical History:  Past Medical History:  Diagnosis Date   Asthma    Depression    Psychosis (HCC)    Schizoaffective disorder (HCC)     Past Surgical History:  Procedure Laterality Date   BACK SURGERY     I&D groin  2017   Family History: History reviewed. No pertinent family history. Family Psychiatric  History: See previous Social History:  Social History   Substance and Sexual Activity  Alcohol Use No     Social History   Substance and Sexual Activity  Drug Use Never    Social History   Socioeconomic History   Marital status: Single    Spouse name: Not on file   Number of children: Not on file   Years of education: Not on file   Highest education level: Not on file  Occupational History   Not on file  Tobacco Use   Smoking status: Never Smoker   Smokeless tobacco: Never Used  Vaping Use   Vaping Use: Never used  Substance and Sexual Activity   Alcohol use: No   Drug  use: Never   Sexual activity: Not Currently  Other Topics Concern   Not on file  Social History Narrative   Not on file   Social Determinants of Health   Financial Resource Strain:    Difficulty of Paying Living Expenses: Not on file  Food Insecurity:    Worried About Running Out of Food in the Last Year: Not on file   Ran Out of Food in the Last Year: Not on file  Transportation Needs:    Lack of Transportation (Medical): Not on file   Lack of Transportation (Non-Medical): Not on file  Physical Activity:    Days of Exercise per Week: Not on file   Minutes of Exercise per Session: Not on file  Stress:    Feeling of Stress : Not on file  Social Connections:    Frequency of Communication with Friends and Family: Not on file   Frequency of Social Gatherings with Friends and Family: Not on file   Attends Religious Services: Not on file   Active Member of Clubs or Organizations: Not on file   Attends Banker Meetings: Not on file   Marital Status: Not on file   Additional Social History:                         Sleep: Fair  Appetite:  Fair  Current Medications: Current Facility-Administered Medications  Medication Dose Route  Frequency Provider Last Rate Last Admin   acetaminophen (TYLENOL) tablet 650 mg  650 mg Oral Q6H PRN Gillermo Murdoch, NP       alum & mag hydroxide-simeth (MAALOX/MYLANTA) 200-200-20 MG/5ML suspension 30 mL  30 mL Oral Q4H PRN Gillermo Murdoch, NP       amLODipine (NORVASC) tablet 5 mg  5 mg Oral Daily Gillermo Murdoch, NP   5 mg at 06/16/20 0759   cloZAPine (CLOZARIL) tablet 100 mg  100 mg Oral QHS [name], [name], MD   100 mg at 06/15/20 2131   divalproex (DEPAKOTE) DR tablet 500 mg  500 mg Oral Q12H Gillermo Murdoch, NP   500 mg at 06/16/20 0759   magnesium hydroxide (MILK OF MAGNESIA) suspension 30 mL  30 mL Oral Daily PRN Gillermo Murdoch, NP       temazepam (RESTORIL) capsule 15 mg   15 mg Oral QHS PRN Gillermo Murdoch, NP   15 mg at 06/15/20 2131    Lab Results:  No results found for this or any previous visit (from the past 48 hour(s)).  Blood Alcohol level:  Lab Results  Component Value Date   ETH <10 06/13/2020   ETH <10 08/14/2019    Metabolic Disorder Labs: Lab Results  Component Value Date   HGBA1C 5.1 08/10/2018   MPG 99.67 08/10/2018   MPG 93.93 03/08/2018   No results found for: PROLACTIN Lab Results  Component Value Date   CHOL 166 05/05/2019   TRIG 117 05/05/2019   HDL 47 05/05/2019   CHOLHDL 3.5 05/05/2019   VLDL 23 05/05/2019   LDLCALC 96 05/05/2019   LDLCALC 108 (H) 08/10/2018    Physical Findings: AIMS: Facial and Oral Movements Muscles of Facial Expression: None, normal Lips and Perioral Area: None, normal Jaw: None, normal Tongue: None, normal,Extremity Movements Upper (arms, wrists, hands, fingers): None, normal Lower (legs, knees, ankles, toes): None, normal, Trunk Movements Neck, shoulders, hips: None, normal, Overall Severity Severity of abnormal movements (highest score from questions above): None, normal Incapacitation due to abnormal movements: None, normal Patient's awareness of abnormal movements (rate only patient's report): No Awareness, Dental Status Current problems with teeth and/or dentures?: No Does patient usually wear dentures?: No  CIWA:  CIWA-Ar Total: 0 COWS:  COWS Total Score: 1  Musculoskeletal: Strength & Muscle Tone: within normal limits Gait & Station: normal Patient leans: N/A  Psychiatric Specialty Exam:     Blood pressure (!) 139/92, pulse 91, temperature 97.8 F (36.6 C), temperature source Oral, resp. rate 17, height 6' (1.829 m), weight 105.7 kg, SpO2 100 %.Body mass index is 31.6 kg/m.  General Appearance: Casual. Seen in room.   Eye Contact:  Minimal  Speech:  Slow  Volume:  wnl  Mood:  Euthymic  Affect:  guarded  Thought Process:  Coherent  Orientation:  Full (Time, Place,  and Person)  Thought Content:  Paranoid Ideation, Rumination and Tangential  Suicidal Thoughts:  No  Homicidal Thoughts:  No  Memory:  Immediate;   Fair Recent;   Poor Remote;   Fair  Judgement:  Impaired  Insight:  Shallow  Psychomotor Activity:  Decreased  Concentration:  Concentration: Poor  Recall:  [employer] of Knowledge:  Fair  Language:  Fair  Akathisia:  No  Handed:  Right  AIMS (if indicated):     Assets:  Desire for Improvement Housing Resilience  ADL's:  Impaired  Cognition:  Impaired,  Mild  Sleep:  Number of Hours: 7.75  Treatment Plan Summary: Daily contact with patient to assess and evaluate symptoms and progress in treatment, Medication management and Plan In line with plan to retitrate clozapine will increase dose to 100 mg tonight.  Defer starting morning dose yet.  Continue other medicine.  Encourage patient to consider getting up getting out of bed and interacting with others.  No other change to treatment plan today.   11/26 Will consider starting clozaril AM dose tomorrow. Depakote level on Sunday    Reggie Pile, MD 06/16/2020, 11:53 AM

## 2020-06-17 MED ORDER — CLOZAPINE 25 MG PO TABS
25.0000 mg | ORAL_TABLET | Freq: Every day | ORAL | Status: DC
  Administered 2020-06-17 – 2020-06-18 (×2): 25 mg via ORAL
  Filled 2020-06-17 (×2): qty 1

## 2020-06-17 NOTE — Progress Notes (Signed)
Pt is alert and oriented to person, place, time and situation. Pt is calm, cooperative, denies suicidal and homicidal ideation, denies feelings of anxiety and depression. Pt is out for meals, appetite is good, reports he slept well, is medication complaint. Pt is pleasant, affect is flat, when asked to describe his mood, states, "good." Pt is cooperative with unit routines and care. Pt noted to be isolating in his room resting quietly awake in his bed, napping at other times. No distress noted, none reported, will continue to monitor pt per Q15 minute face checks and monitor for safety and progress.

## 2020-06-17 NOTE — Progress Notes (Signed)
[facility] MD Progress Note  06/17/2020 11:06 AM [name] [name]  MRN:  [id number] Subjective:   11/27 Patient was seen in his room.  Says that he prefers to speak in the room today.  He has his blanket over his body and head.  Tells me that he is doing just fine.  He denies paranoia.  In fact, denies being paranoid prior to his admission.  Subjectively reports no irritability depression or hallucinations.  He is taking his medications as prescribed.  Denies any new medical problems including excessive daytime sedation, constipation, and drooling on the Clozaril.  11/26 [name] [name] was seen for initial psychiatric evaluation on June 14, 2020.  He was petition for IVC by his ACT team for acute psychosis.  Today, he offers limited information.  Denies any concerns in terms of depression anxiety or irritability.  Denies having auditory, visual, and olfactory hallucinations.  Denies delusions.  Says that he sleeping well.  No new medical problems.  By staff and nursing, patient is described to be at his baseline.  He does tend to isolate in his room.  He will occasionally come out and pace the halls. Principal Problem: Schizoaffective disorder, bipolar type (HCC) Diagnosis: Principal Problem:   Schizoaffective disorder, bipolar type (HCC) Active Problems:   Hypertension  Total Time spent with patient: 30 minutes  Past Psychiatric History: Past history of recurrent episodes of psychosis multiple prior hospitalizations problems with insight and compliance  Past Medical History:  Past Medical History:  Diagnosis Date  . Asthma   . Depression   . Psychosis (HCC)   . Schizoaffective disorder [facility])     Past Surgical History:  Procedure Laterality Date  . BACK SURGERY    . I&D groin  2017   Family History: History reviewed. No pertinent family history. Family Psychiatric  History: See previous Social History:  Social History   Substance and Sexual Activity  Alcohol Use No     Social History    Substance and Sexual Activity  Drug Use Never    Social History   Socioeconomic History  . Marital status: Single    Spouse name: Not on file  . Number of children: Not on file  . Years of education: Not on file  . Highest education level: Not on file  Occupational History  . Not on file  Tobacco Use  . Smoking status: Never Smoker  . Smokeless tobacco: Never Used  Vaping Use  . Vaping Use: Never used  Substance and Sexual Activity  . Alcohol use: No  . Drug use: Never  . Sexual activity: Not Currently  Other Topics Concern  . Not on file  Social History Narrative  . Not on file   Social Determinants of Health   Financial Resource Strain:   . Difficulty of Paying Living Expenses: Not on file  Food Insecurity:   . Worried About Programme researcher, broadcasting/film/video in the Last Year: Not on file  . Ran Out of Food in the Last Year: Not on file  Transportation Needs:   . Lack of Transportation (Medical): Not on file  . Lack of Transportation (Non-Medical): Not on file  Physical Activity:   . Days of Exercise per Week: Not on file  . Minutes of Exercise per Session: Not on file  Stress:   . Feeling of Stress : Not on file  Social Connections:   . Frequency of Communication with Friends and Family: Not on file  . Frequency of Social Gatherings with Friends  and Family: Not on file  . Attends Religious Services: Not on file  . Active Member of Clubs or Organizations: Not on file  . Attends Banker Meetings: Not on file  . Marital Status: Not on file   Additional Social History:                         Sleep: Fair  Appetite:  Fair  Current Medications: Current Facility-Administered Medications  Medication Dose Route Frequency Provider Last Rate Last Admin  . acetaminophen (TYLENOL) tablet 650 mg  650 mg Oral Q6H PRN Gillermo Murdoch, NP      . alum & mag hydroxide-simeth (MAALOX/MYLANTA) 200-200-20 MG/5ML suspension 30 mL  30 mL Oral Q4H PRN Gillermo Murdoch, NP      . amLODipine (NORVASC) tablet 5 mg  5 mg Oral Daily Gillermo Murdoch, NP   5 mg at 06/17/20 0802  . cloZAPine (CLOZARIL) tablet 100 mg  100 mg Oral QHS [name], [name], MD   100 mg at 06/16/20 2106  . cloZAPine (CLOZARIL) tablet 25 mg  25 mg Oral Daily Reggie Pile, MD      . divalproex (DEPAKOTE) DR tablet 500 mg  500 mg Oral Q12H Gillermo Murdoch, NP   500 mg at 06/17/20 0802  . magnesium hydroxide (MILK OF MAGNESIA) suspension 30 mL  30 mL Oral Daily PRN Gillermo Murdoch, NP      . temazepam (RESTORIL) capsule 15 mg  15 mg Oral QHS PRN Gillermo Murdoch, NP   15 mg at 06/15/20 2131    Lab Results:  No results found for this or any previous visit (from the past 48 hour(s)).  Blood Alcohol level:  Lab Results  Component Value Date   ETH <10 06/13/2020   ETH <10 08/14/2019    Metabolic Disorder Labs: Lab Results  Component Value Date   HGBA1C 5.1 08/10/2018   MPG 99.67 08/10/2018   MPG 93.93 03/08/2018   No results found for: PROLACTIN Lab Results  Component Value Date   CHOL 166 05/05/2019   TRIG 117 05/05/2019   HDL 47 05/05/2019   CHOLHDL 3.5 05/05/2019   VLDL 23 05/05/2019   LDLCALC 96 05/05/2019   LDLCALC 108 (H) 08/10/2018    Physical Findings: AIMS: Facial and Oral Movements Muscles of Facial Expression: None, normal Lips and Perioral Area: None, normal Jaw: None, normal Tongue: None, normal,Extremity Movements Upper (arms, wrists, hands, fingers): None, normal Lower (legs, knees, ankles, toes): None, normal, Trunk Movements Neck, shoulders, hips: None, normal, Overall Severity Severity of abnormal movements (highest score from questions above): None, normal Incapacitation due to abnormal movements: None, normal Patient's awareness of abnormal movements (rate only patient's report): No Awareness, Dental Status Current problems with teeth and/or dentures?: No Does patient usually wear dentures?: No  CIWA:  CIWA-Ar Total:  0 COWS:  COWS Total Score: 1  Musculoskeletal: Strength & Muscle Tone: within normal limits Gait & Station: normal Patient leans: N/A  Psychiatric Specialty Exam:     Blood pressure (!) 154/98, pulse 91, temperature 98.6 F (37 C), temperature source Oral, resp. rate 18, height 6' (1.829 m), weight 105.7 kg, SpO2 100 %.Body mass index is 31.6 kg/m.  General Appearance: Casual. Seen in room.   Eye Contact:  Minimal  Speech:  Slow  Volume:  wnl  Mood:  Euthymic  Affect:  guarded  Thought Process:  Coherent  Orientation:  Full (Time, Place, and Person)  Thought Content:  Paranoid  Ideation, Rumination and Tangential  Suicidal Thoughts:  No  Homicidal Thoughts:  No  Memory:  Immediate;   Fair Recent;   Poor Remote;   Fair  Judgement:  Impaired  Insight:  Shallow  Psychomotor Activity:  Decreased  Concentration:  Concentration: Poor  Recall:  [employer] of Knowledge:  Fair  Language:  Fair  Akathisia:  No  Handed:  Right  AIMS (if indicated):     Assets:  Desire for Improvement Housing Resilience  ADL's:  Impaired  Cognition:  Impaired,  Mild  Sleep:  Number of Hours: 8.25     Treatment Plan Summary: Daily contact with patient to assess and evaluate symptoms and progress in treatment, Medication management and Plan In line with plan to retitrate clozapine will increase dose to 100 mg tonight.  Defer starting morning dose yet.  Continue other medicine.  Encourage patient to consider getting up getting out of bed and interacting with others.  No other change to treatment plan today.   11/26 Will consider starting clozaril AM dose tomorrow. Depakote level on Sunday  11/27 Add Clozaril 25 mg p.o. daily.  Records indicate that his home dosage is 50 mg in the morning. Depakote level tomorrow CBC with differential will be obtained on Tuesday No constipation drooling or excessive sedation on his Clozaril.  Monitor.    Reggie Pile, MD 06/17/2020, 11:06 AM

## 2020-06-18 LAB — LIPID PANEL
Cholesterol: 157 mg/dL (ref 0–200)
HDL: 45 mg/dL (ref >40)
LDL Cholesterol: 95 mg/dL (ref 0–99)
Total CHOL/HDL Ratio: 3.5 RATIO
Triglycerides: 83 mg/dL (ref <150)
VLDL: 17 mg/dL (ref 0–40)

## 2020-06-18 LAB — HEMOGLOBIN A1C
Hgb A1c MFr Bld: 4.9 % (ref 4.8–5.6)
Mean Plasma Glucose: 93.93 mg/dL

## 2020-06-18 LAB — VALPROIC ACID LEVEL: Valproic Acid Lvl: 72 ug/mL (ref 50.0–100.0)

## 2020-06-18 MED ORDER — CLOZAPINE 25 MG PO TABS
25.0000 mg | ORAL_TABLET | Freq: Once | ORAL | Status: AC
  Administered 2020-06-18: 25 mg via ORAL
  Filled 2020-06-18: qty 1

## 2020-06-18 MED ORDER — CLOZAPINE 25 MG PO TABS
50.0000 mg | ORAL_TABLET | Freq: Every day | ORAL | Status: DC
  Administered 2020-06-19 – 2020-06-20 (×2): 50 mg via ORAL
  Filled 2020-06-18 (×2): qty 2

## 2020-06-18 NOTE — Progress Notes (Signed)
Pt is alert and oriented to person, place, time and situation. Pt is calm, cooperative, pleasant, denies suicidal and homicidal ideation, denies hallucinations, affect is flat, pt is visible in the dayroom at times, often napping in his room, reports he is too tired to take his medication in the afternoon when a new one was ordered, states he will take it after he sleeps some more, it was moved to give at a later time in the [facility]. Pt is unmotivated for treatment today, states, "I feel fine today." Pt's eye contact is fair, insight is poor, when asked reason for admission to the hospital, pt states, "I don't know." No distress noted, none reported, pt voices no complaints, will continue to monitor pt per Q15 minute face checks and monitor for safety and progress.

## 2020-06-18 NOTE — Progress Notes (Signed)
Patient presents pleasant and cooperative. Denies all, in no distress. Medication compliant. Appropriate with staff and peers although minimal interaction. Visible in dayroom watching tv, no interaction. Sleep aid given Encouragement and support provided. Safety checks maintained. Medications given as prescribed.  Pt receptive and remains safe on unit with q 15 min checks.

## 2020-06-18 NOTE — Plan of Care (Signed)
  Problem: Role Relationship: Goal: Ability to communicate needs accurately will improve Outcome: Not Progressing   Problem: Safety: Goal: Ability to redirect hostility and anger into socially appropriate behaviors will improve Outcome: Progressing Goal: Ability to remain free from injury will improve Outcome: Progressing

## 2020-06-18 NOTE — [facility] Group Notes (Signed)
[facility] Group Notes: (Clinical Social Work)   06/18/2020      Type of Therapy:  Group Therapy   Participation Level:  Did Not Attend - was invited individually by Nurse/MHT and chose not to attend.   Susa Simmonds, LCSWA 06/18/2020  4:42 PM

## 2020-06-18 NOTE — Progress Notes (Signed)
[name] MD Progress Note  06/18/2020 9:13 AM [name] [name]  MRN:  [id number] Subjective:   11/28 Patient was seen in bed today, per his usual.  Denies have any concerns.  "I am fine, thank you," he remarks.  He is taking all of his medications and did not refuse labs today.  His Depakote level is pending.  Unable to elaborate on questions or asks, and overall endorses a negative symptomatology.  11/27 Patient was seen in his room.  Says that he prefers to speak in the room today.  He has his blanket over his body and head.  Tells me that he is doing just fine.  He denies paranoia.  In fact, denies being paranoid prior to his admission.  Subjectively reports no irritability depression or hallucinations.  He is taking his medications as prescribed.  Denies any new medical problems including excessive daytime sedation, constipation, and drooling on the Clozaril.  11/26 [name] [name] was seen for initial psychiatric evaluation on June 14, 2020.  He was petition for IVC by his ACT team for acute psychosis.  Today, he offers limited information.  Denies any concerns in terms of depression anxiety or irritability.  Denies having auditory, visual, and olfactory hallucinations.  Denies delusions.  Says that he sleeping well.  No new medical problems.  By staff and nursing, patient is described to be at his baseline.  He does tend to isolate in his room.  He will occasionally come out and pace the halls. Principal Problem: Schizoaffective disorder, bipolar type (HCC) Diagnosis: Principal Problem:   Schizoaffective disorder, bipolar type (HCC) Active Problems:   Hypertension  Total Time spent with patient: 30 minutes  Past Psychiatric History: Past history of recurrent episodes of psychosis multiple prior hospitalizations problems with insight and compliance  Past Medical History:  Past Medical History:  Diagnosis Date  . Asthma   . Depression   . Psychosis (HCC)   . Schizoaffective disorder [facility])      Past Surgical History:  Procedure Laterality Date  . BACK SURGERY    . I&D groin  2017   Family History: History reviewed. No pertinent family history. Family Psychiatric  History: See previous Social History:  Social History   Substance and Sexual Activity  Alcohol Use No     Social History   Substance and Sexual Activity  Drug Use Never    Social History   Socioeconomic History  . Marital status: Single    Spouse name: Not on file  . Number of children: Not on file  . Years of education: Not on file  . Highest education level: Not on file  Occupational History  . Not on file  Tobacco Use  . Smoking status: Never Smoker  . Smokeless tobacco: Never Used  Vaping Use  . Vaping Use: Never used  Substance and Sexual Activity  . Alcohol use: No  . Drug use: Never  . Sexual activity: Not Currently  Other Topics Concern  . Not on file  Social History Narrative  . Not on file   Social Determinants of Health   Financial Resource Strain:   . Difficulty of Paying Living Expenses: Not on file  Food Insecurity:   . Worried About Programme researcher, broadcasting/film/video in the Last Year: Not on file  . Ran Out of Food in the Last Year: Not on file  Transportation Needs:   . Lack of Transportation (Medical): Not on file  . Lack of Transportation (Non-Medical): Not on file  Physical  Activity:   . Days of Exercise per Week: Not on file  . Minutes of Exercise per Session: Not on file  Stress:   . Feeling of Stress : Not on file  Social Connections:   . Frequency of Communication with Friends and Family: Not on file  . Frequency of Social Gatherings with Friends and Family: Not on file  . Attends Religious Services: Not on file  . Active Member of Clubs or Organizations: Not on file  . Attends Banker Meetings: Not on file  . Marital Status: Not on file   Additional Social History:                         Sleep: Fair  Appetite:  Fair  Current  Medications: Current Facility-Administered Medications  Medication Dose Route Frequency Provider Last Rate Last Admin  . acetaminophen (TYLENOL) tablet 650 mg  650 mg Oral Q6H PRN Gillermo Murdoch, NP      . alum & mag hydroxide-simeth (MAALOX/MYLANTA) 200-200-20 MG/5ML suspension 30 mL  30 mL Oral Q4H PRN Gillermo Murdoch, NP      . amLODipine (NORVASC) tablet 5 mg  5 mg Oral Daily Gillermo Murdoch, NP   5 mg at 06/18/20 0813  . cloZAPine (CLOZARIL) tablet 100 mg  100 mg Oral QHS [name], [name], MD   100 mg at 06/17/20 2103  . cloZAPine (CLOZARIL) tablet 25 mg  25 mg Oral Daily Reggie Pile, MD   25 mg at 06/18/20 0813  . divalproex (DEPAKOTE) DR tablet 500 mg  500 mg Oral Q12H Gillermo Murdoch, NP   500 mg at 06/18/20 0813  . magnesium hydroxide (MILK OF MAGNESIA) suspension 30 mL  30 mL Oral Daily PRN Gillermo Murdoch, NP      . temazepam (RESTORIL) capsule 15 mg  15 mg Oral QHS PRN Gillermo Murdoch, NP   15 mg at 06/17/20 2102    Lab Results:  Results for orders placed or performed during the hospital encounter of 06/13/20 (from the past 48 hour(s))  Lipid panel     Status: None   Collection Time: 06/18/20  7:07 AM  Result Value Ref Range   Cholesterol 157 0 - 200 mg/dL   Triglycerides 83 <650 mg/dL   HDL 45 >35 mg/dL   Total CHOL/HDL Ratio 3.5 RATIO   VLDL 17 0 - 40 mg/dL   LDL Cholesterol 95 0 - 99 mg/dL    Comment:        Total Cholesterol/HDL:CHD Risk Coronary Heart Disease Risk Table                     Men   Women  1/2 Average Risk   3.4   3.3  Average Risk       5.0   4.4  2 X Average Risk   9.6   7.1  3 X Average Risk  23.4   11.0        Use the calculated Patient Ratio above and the CHD Risk Table to determine the patient's CHD Risk.        ATP III CLASSIFICATION (LDL):  <100     mg/dL   Optimal  465-681  mg/dL   Near or Above                    Optimal  130-159  mg/dL   Borderline  275-170  mg/dL   High  >017  mg/dL   Very  High Performed at [facility], [address] [place], [place] [address]     Blood Alcohol level:  Lab Results  Component Value Date   [name] <10 06/13/2020   ETH <10 08/14/2019    Metabolic Disorder Labs: Lab Results  Component Value Date   HGBA1C 5.1 08/10/2018   MPG 99.67 08/10/2018   MPG 93.93 03/08/2018   No results found for: PROLACTIN Lab Results  Component Value Date   CHOL 157 06/18/2020   TRIG 83 06/18/2020   HDL 45 06/18/2020   CHOLHDL 3.5 06/18/2020   VLDL 17 06/18/2020   LDLCALC 95 06/18/2020   LDLCALC 96 05/05/2019    Physical Findings: AIMS: Facial and Oral Movements Muscles of Facial Expression: None, normal Lips and Perioral Area: None, normal Jaw: None, normal Tongue: None, normal,Extremity Movements Upper (arms, wrists, hands, fingers): None, normal Lower (legs, knees, ankles, toes): None, normal, Trunk Movements Neck, shoulders, hips: None, normal, Overall Severity Severity of abnormal movements (highest score from questions above): None, normal Incapacitation due to abnormal movements: None, normal Patient's awareness of abnormal movements (rate only patient's report): No Awareness, Dental Status Current problems with teeth and/or dentures?: No Does patient usually wear dentures?: No  CIWA:  CIWA-Ar Total: 0 COWS:  COWS Total Score: 1  Musculoskeletal: Strength & Muscle Tone: within normal limits Gait & Station: normal Patient leans: N/A  Psychiatric Specialty Exam:     Blood pressure (!) 133/92, pulse 86, temperature 97.7 F (36.5 C), temperature source Oral, resp. rate 18, height 6' (1.829 m), weight 105.7 kg, SpO2 100 %.Body mass index is 31.6 kg/m.  General Appearance: Casual. Seen in room.   Eye Contact:  Minimal  Speech:  Slow  Volume:  wnl  Mood:  Euthymic  Affect:  guarded  Thought Process:  Coherent  Orientation:  Full (Time, Place, and Person)  Thought Content:  Paranoid Ideation, Rumination and Tangential   Suicidal Thoughts:  No  Homicidal Thoughts:  No  Memory:  Immediate;   Fair Recent;   Poor Remote;   Fair  Judgement:  Impaired  Insight:  Shallow  Psychomotor Activity:  Decreased  Concentration:  Concentration: Poor  Recall:  [employer] of Knowledge:  Fair  Language:  Fair  Akathisia:  No  Handed:  Right  AIMS (if indicated):     Assets:  Desire for Improvement Housing Resilience  ADL's:  Impaired  Cognition:  Impaired,  Mild  Sleep:  Number of Hours: 4     Treatment Plan Summary: Daily contact with patient to assess and evaluate symptoms and progress in treatment, Medication management and Plan In line with plan to retitrate clozapine will increase dose to 100 mg tonight.  Defer starting morning dose yet.  Continue other medicine.  Encourage patient to consider getting up getting out of bed and interacting with others.  No other change to treatment plan today.   11/26 Will consider starting clozaril AM dose tomorrow. Depakote level on Sunday  11/27 Add Clozaril 25 mg p.o. daily.  Records indicate that his home dosage is 50 mg in the morning. Depakote level tomorrow CBC with differential will be obtained on Tuesday No constipation drooling or excessive sedation on his Clozaril.  Monitor.  11/28 Depakote level pending Increase Clozaril to 50 mg p.o. daily   Reggie Pile, MD 06/18/2020, 9:13 AM

## 2020-06-18 NOTE — Plan of Care (Signed)
  Problem: Consults Goal: Concurrent Medical Patient Education Description: (See Patient Education Module for education specifics) Outcome: Progressing   Problem: [facility] Concurrent Medical Problem Goal: LTG-Pt will be physically stable and he/significant other Description: (Patient will be physically stable and he/significant other will be able to verbalize understanding of follow-up care and symptoms that would warrant further treatment) Outcome: Progressing Goal: STG-Vital signs will be within defined limits or stabilized Description: (STG- Vital signs will be within defined limits or stabilized for individual) Outcome: Progressing Goal: STG-Compliance with medication and/or treatment as ordered Description: (STG-Compliance with medication and/or treatment as ordered by MD) Outcome: Progressing Goal: STG-Verbalize two symptoms that would warrant further Description: (STG-Verbalize two symptoms that would warrant further treatment) Outcome: Progressing Goal: STG-Patient will participate in management/stabilization Description: (STG-Patient will participate in management/stabilization of medical condition) Outcome: Progressing Goal: STG-Other (Specify): Description: STG-Other Concurrent Medical (Specify): Outcome: Progressing   Problem: Activity: Goal: Will verbalize the importance of balancing activity with adequate rest periods Outcome: Progressing   Problem: Education: Goal: Will be free of psychotic symptoms Outcome: Progressing Goal: Knowledge of the prescribed therapeutic regimen will improve Outcome: Progressing   Problem: Coping: Goal: Coping ability will improve Outcome: Progressing Goal: Will verbalize feelings Outcome: Progressing   Problem: Health Behavior/Discharge Planning: Goal: Compliance with prescribed medication regimen will improve Outcome: Progressing   Problem: Nutritional: Goal: Ability to achieve adequate nutritional intake will improve Outcome:  Progressing   Problem: Role Relationship: Goal: Ability to communicate needs accurately will improve Outcome: Progressing Goal: Ability to interact with others will improve Outcome: Progressing   Problem: Safety: Goal: Ability to redirect hostility and anger into socially appropriate behaviors will improve Outcome: Progressing Goal: Ability to remain free from injury will improve Outcome: Progressing   Problem: Self-Care: Goal: Ability to participate in self-care as condition permits will improve Outcome: Progressing   Problem: Self-Concept: Goal: Will verbalize positive feelings about self Outcome: Progressing   

## 2020-06-19 MED ORDER — CLOZAPINE 100 MG PO TABS
200.0000 mg | ORAL_TABLET | Freq: Every day | ORAL | Status: DC
  Administered 2020-06-19: 200 mg via ORAL
  Filled 2020-06-19: qty 2

## 2020-06-19 NOTE — Plan of Care (Signed)
D- Patient alert and oriented. Patient presents in a pleasant mood on assessment stating that he slept good last night and had no complaints to voice to this writer at this time. Patient denies SI, HI, AVH, and pain at this time. Patient also denies any signs/symptoms of depression and anxiety, reporting that overall, he is feeling good. Patient had no stated goals for today.   A- Scheduled medications administered to patient, per MD orders. Support and encouragement provided.  Routine safety checks conducted every 15 minutes.  Patient informed to notify staff with problems or concerns.  R- No adverse drug reactions noted. Patient contracts for safety at this time. Patient compliant with medications. Patient receptive, calm, and cooperative. Patient remains safe at this time.  Problem: Consults Goal: Concurrent Medical Patient Education Description: (See Patient Education Module for education specifics) Outcome: Progressing   Problem: [facility] Concurrent Medical Problem Goal: LTG-Pt will be physically stable and he/significant other Description: (Patient will be physically stable and he/significant other will be able to verbalize understanding of follow-up care and symptoms that would warrant further treatment) Outcome: Progressing Goal: STG-Vital signs will be within defined limits or stabilized Description: (STG- Vital signs will be within defined limits or stabilized for individual) Outcome: Progressing Goal: STG-Compliance with medication and/or treatment as ordered Description: (STG-Compliance with medication and/or treatment as ordered by MD) Outcome: Progressing Goal: STG-Verbalize two symptoms that would warrant further Description: (STG-Verbalize two symptoms that would warrant further treatment) Outcome: Progressing Goal: STG-Patient will participate in management/stabilization Description: (STG-Patient will participate in management/stabilization of medical condition) Outcome:  Progressing Goal: STG-Other (Specify): Description: STG-Other Concurrent Medical (Specify): Outcome: Progressing   Problem: Activity: Goal: Will verbalize the importance of balancing activity with adequate rest periods Outcome: Progressing   Problem: Education: Goal: Will be free of psychotic symptoms Outcome: Progressing Goal: Knowledge of the prescribed therapeutic regimen will improve Outcome: Progressing   Problem: Coping: Goal: Coping ability will improve Outcome: Progressing Goal: Will verbalize feelings Outcome: Progressing   Problem: Health Behavior/Discharge Planning: Goal: Compliance with prescribed medication regimen will improve Outcome: Progressing   Problem: Nutritional: Goal: Ability to achieve adequate nutritional intake will improve Outcome: Progressing   Problem: Role Relationship: Goal: Ability to communicate needs accurately will improve Outcome: Progressing Goal: Ability to interact with others will improve Outcome: Progressing   Problem: Safety: Goal: Ability to redirect hostility and anger into socially appropriate behaviors will improve Outcome: Progressing Goal: Ability to remain free from injury will improve Outcome: Progressing   Problem: Self-Care: Goal: Ability to participate in self-care as condition permits will improve Outcome: Progressing   Problem: Self-Concept: Goal: Will verbalize positive feelings about self Outcome: Progressing

## 2020-06-19 NOTE — Progress Notes (Signed)
Recreation Therapy Notes  Date: 06/19/2020  Time: 9:30 am   Location: Craft room     Behavioral response: N/A   Intervention Topic: Self-care   Discussion/Intervention: Patient did not attend group.   Clinical Observations/Feedback:  Patient did not attend group.   Charna Neeb LRT/CTRS        Brook Geraci 06/19/2020 11:53 AM

## 2020-06-19 NOTE — Progress Notes (Signed)
[facility] MD Progress Note  06/19/2020 12:07 PM [name] [name]  MRN:  [id number] Subjective:  [name] [name] was seen for initial psychiatric evaluation on June 14, 2020.  He was petition for IVC by his ACT team for acute psychosis. He had no acute events overnight, and has remained medication compliant. This morning he denies suicidal ideations, homicidal ideations, auditory hallucinations, and visual hallucinations. He feels he is back to his baseline, and would like to return home to his mother. Depakote level therapeutic at 72. Clozapine back to home dose. Invega Sustenna 234 mg IM injection given 06/14/2020, next dose due 07/12/20.   Principal Problem: Schizoaffective disorder, bipolar type (HCC) Diagnosis: Principal Problem:   Schizoaffective disorder, bipolar type (HCC) Active Problems:   Hypertension  Total Time spent with patient: 30 minutes  Past Psychiatric History: Past history of recurrent episodes of psychosis multiple prior hospitalizations problems with insight and compliance  Past Medical History:  Past Medical History:  Diagnosis Date  . Asthma   . Depression   . Psychosis (HCC)   . Schizoaffective disorder [facility])     Past Surgical History:  Procedure Laterality Date  . BACK SURGERY    . I&D groin  2017   Family History: History reviewed. No pertinent family history. Family Psychiatric  History: See previous Social History:  Social History   Substance and Sexual Activity  Alcohol Use No     Social History   Substance and Sexual Activity  Drug Use Never    Social History   Socioeconomic History  . Marital status: Single    Spouse name: Not on file  . Number of children: Not on file  . Years of education: Not on file  . Highest education level: Not on file  Occupational History  . Not on file  Tobacco Use  . Smoking status: Never Smoker  . Smokeless tobacco: Never Used  Vaping Use  . Vaping Use: Never used  Substance and Sexual Activity  . Alcohol  use: No  . Drug use: Never  . Sexual activity: Not Currently  Other Topics Concern  . Not on file  Social History Narrative  . Not on file   Social Determinants of Health   Financial Resource Strain:   . Difficulty of Paying Living Expenses: Not on file  Food Insecurity:   . Worried About Programme researcher, broadcasting/film/video in the Last Year: Not on file  . Ran Out of Food in the Last Year: Not on file  Transportation Needs:   . Lack of Transportation (Medical): Not on file  . Lack of Transportation (Non-Medical): Not on file  Physical Activity:   . Days of Exercise per Week: Not on file  . Minutes of Exercise per Session: Not on file  Stress:   . Feeling of Stress : Not on file  Social Connections:   . Frequency of Communication with Friends and Family: Not on file  . Frequency of Social Gatherings with Friends and Family: Not on file  . Attends Religious Services: Not on file  . Active Member of Clubs or Organizations: Not on file  . Attends Banker Meetings: Not on file  . Marital Status: Not on file   Additional Social History:                         Sleep: Fair  Appetite:  Fair  Current Medications: Current Facility-Administered Medications  Medication Dose Route Frequency Provider Last Rate Last Admin  .  acetaminophen (TYLENOL) tablet 650 mg  650 mg Oral Q6H PRN Gillermo Murdoch, NP      . alum & mag hydroxide-simeth (MAALOX/MYLANTA) 200-200-20 MG/5ML suspension 30 mL  30 mL Oral Q4H PRN Gillermo Murdoch, NP      . amLODipine (NORVASC) tablet 5 mg  5 mg Oral Daily Gillermo Murdoch, NP   5 mg at 06/19/20 0912  . cloZAPine (CLOZARIL) tablet 100 mg  100 mg Oral QHS [name], [name], MD   100 mg at 06/18/20 2126  . cloZAPine (CLOZARIL) tablet 50 mg  50 mg Oral Daily Reggie Pile, MD   50 mg at 06/19/20 0912  . divalproex (DEPAKOTE) DR tablet 500 mg  500 mg Oral Q12H Gillermo Murdoch, NP   500 mg at 06/19/20 0912  . magnesium hydroxide (MILK OF  MAGNESIA) suspension 30 mL  30 mL Oral Daily PRN Gillermo Murdoch, NP      . temazepam (RESTORIL) capsule 15 mg  15 mg Oral QHS PRN Gillermo Murdoch, NP   15 mg at 06/18/20 2126    Lab Results:  Results for orders placed or performed during the hospital encounter of 06/13/20 (from the past 48 hour(s))  Lipid panel     Status: None   Collection Time: 06/18/20  7:07 AM  Result Value Ref Range   Cholesterol 157 0 - 200 mg/dL   Triglycerides 83 <989 mg/dL   HDL 45 >21 mg/dL   Total CHOL/HDL Ratio 3.5 RATIO   VLDL 17 0 - 40 mg/dL   LDL Cholesterol 95 0 - 99 mg/dL    Comment:        Total Cholesterol/HDL:CHD Risk Coronary Heart Disease Risk Table                     Men   Women  1/2 Average Risk   3.4   3.3  Average Risk       5.0   4.4  2 X Average Risk   9.6   7.1  3 X Average Risk  23.4   11.0        Use the calculated Patient Ratio above and the CHD Risk Table to determine the patient's CHD Risk.        ATP III CLASSIFICATION (LDL):  <100     mg/dL   Optimal  194-174  mg/dL   Near or Above                    Optimal  130-159  mg/dL   Borderline  081-448  mg/dL   High  >185     mg/dL   Very High Performed at [facility], [name] [place], [place] [address]   Hemoglobin A1c     Status: None   Collection Time: 06/18/20  7:07 AM  Result Value Ref Range   Hgb A1c MFr Bld 4.9 4.8 - 5.6 %    Comment: (NOTE) Pre diabetes:          5.7%-6.4%  Diabetes:              >6.4%  Glycemic control for   <7.0% adults with diabetes    Mean Plasma Glucose 93.93 mg/dL    Comment: Performed at [facility], [address] [place], [place] [address]  Valproic acid level     Status: None   Collection Time: 06/18/20  7:07 AM  Result Value Ref Range   Valproic Acid Lvl 72 50.0 -  100.0 ug/mL    Comment: Performed at [facility], [address] [place], [place] [address]    Blood Alcohol level:  Lab Results  Component Value Date   [facility] <10  06/13/2020   ETH <10 08/14/2019    Metabolic Disorder Labs: Lab Results  Component Value Date   HGBA1C 4.9 06/18/2020   MPG 93.93 06/18/2020   MPG 99.67 08/10/2018   No results found for: PROLACTIN Lab Results  Component Value Date   CHOL 157 06/18/2020   TRIG 83 06/18/2020   HDL 45 06/18/2020   CHOLHDL 3.5 06/18/2020   VLDL 17 06/18/2020   LDLCALC 95 06/18/2020   LDLCALC 96 05/05/2019    Physical Findings: AIMS: Facial and Oral Movements Muscles of Facial Expression: None, normal Lips and Perioral Area: None, normal Jaw: None, normal Tongue: None, normal,Extremity Movements Upper (arms, wrists, hands, fingers): None, normal Lower (legs, knees, ankles, toes): None, normal, Trunk Movements Neck, shoulders, hips: None, normal, Overall Severity Severity of abnormal movements (highest score from questions above): None, normal Incapacitation due to abnormal movements: None, normal Patient's awareness of abnormal movements (rate only patient's report): No Awareness, Dental Status Current problems with teeth and/or dentures?: No Does patient usually wear dentures?: No  CIWA:  CIWA-Ar Total: 0 COWS:  COWS Total Score: 1  Musculoskeletal: Strength & Muscle Tone: within normal limits Gait & Station: normal Patient leans: N/A  Psychiatric Specialty Exam:     Blood pressure (!) 143/95, pulse 89, temperature 97.8 F (36.6 C), temperature source Oral, resp. rate 18, height 6' (1.829 m), weight 105.7 kg, SpO2 100 %.Body mass index is 31.6 kg/m.  General Appearance: Casual. Seen in room.   Eye Contact:  Fair  Speech:  Clear and Coherent and Normal Rate  Volume:  wnl  Mood:  Euthymic  Affect:  guarded  Thought Process:  Coherent  Orientation:  Full (Time, Place, and Person)  Thought Content:  Logical  Suicidal Thoughts:  No  Homicidal Thoughts:  No  Memory:  Immediate;   Fair Recent;   Poor Remote;   Fair  Judgement:  Impaired  Insight:  Shallow  Psychomotor  Activity:  Decreased  Concentration:  Concentration: Poor  Recall:  [employer] of Knowledge:  Fair  Language:  Fair  Akathisia:  No  Handed:  Right  AIMS (if indicated):     Assets:  Desire for Improvement Housing Resilience  ADL's:  Impaired  Cognition:  Impaired,  Mild  Sleep:  Number of Hours: 6     Treatment Plan Summary: Daily contact with patient to assess and evaluate symptoms and progress in treatment, Medication management and Plan will increase Clozapine to home dose 50 mg in the morning, and 200 mg at night. Continue Depakote 500 mg BID, valproic acid level 72.  Invega Sustenna 234 mg IM injection completed 06/14/2020. Encourage patient to consider getting up getting out of bed and interacting with others.  No other change to treatment plan today. CBC with differential will be obtained on Tuesday      Jesse Sans, MD 06/19/2020, 12:07 PM

## 2020-06-19 NOTE — Progress Notes (Signed)
Pharmacy - Clozapine     This patient's order has been reviewed for prescribing contraindications.   Labs:  Absolut Neutraphil=3.4  The medication is being dispensed pursuant to the FDA REMS suspension order of 06/09/20 that allows for dispensing without a patient REMS dispense authorization (RDA).    Rico Junker

## 2020-06-19 NOTE — BHH Group Notes (Signed)
LCSW Group Therapy Note   06/19/2020 2:18 PM  Type of Therapy and Topic:  Group Therapy:  Overcoming Obstacles   Participation Level:  Did Not Attend   Description of Group:    In this group patients will be encouraged to explore what they see as obstacles to their own wellness and recovery. They will be guided to discuss their thoughts, feelings, and behaviors related to these obstacles. The group will process together ways to cope with barriers, with attention given to specific choices patients can make. Each patient will be challenged to identify changes they are motivated to make in order to overcome their obstacles. This group will be process-oriented, with patients participating in exploration of their own experiences as well as giving and receiving support and challenge from other group members.   Therapeutic Goals: 1. Patient will identify personal and current obstacles as they relate to admission. 2. Patient will identify barriers that currently interfere with their wellness or overcoming obstacles.  3. Patient will identify feelings, thought process and behaviors related to these barriers. 4. Patient will identify two changes they are willing to make to overcome these obstacles:      Summary of Patient Progress   X    Therapeutic Modalities:   Cognitive Behavioral Therapy Solution Focused Therapy Motivational Interviewing Relapse Prevention Therapy  Nickolas Chalfin, MSW, LCSW 06/19/2020 2:18 PM    

## 2020-06-19 NOTE — Tx Team (Signed)
Interdisciplinary Treatment and Diagnostic Plan Update  06/19/2020 Time of Session: 8:30AM [name] [name] MRN: [id number]  Principal Diagnosis: Schizoaffective disorder, bipolar type (HCC)  Secondary Diagnoses: Principal Problem:   Schizoaffective disorder, bipolar type (HCC) Active Problems:   Hypertension   Current Medications:  Current Facility-Administered Medications  Medication Dose Route Frequency Provider Last Rate Last Admin  . acetaminophen (TYLENOL) tablet 650 mg  650 mg Oral Q6H PRN Gillermo Murdoch, NP      . alum & mag hydroxide-simeth (MAALOX/MYLANTA) 200-200-20 MG/5ML suspension 30 mL  30 mL Oral Q4H PRN Gillermo Murdoch, NP      . amLODipine (NORVASC) tablet 5 mg  5 mg Oral Daily Gillermo Murdoch, NP   5 mg at 06/19/20 0912  . cloZAPine (CLOZARIL) tablet 100 mg  100 mg Oral QHS [name], [name], MD   100 mg at 06/18/20 2126  . cloZAPine (CLOZARIL) tablet 50 mg  50 mg Oral Daily Reggie Pile, MD   50 mg at 06/19/20 0912  . divalproex (DEPAKOTE) DR tablet 500 mg  500 mg Oral Q12H Gillermo Murdoch, NP   500 mg at 06/19/20 0912  . magnesium hydroxide (MILK OF MAGNESIA) suspension 30 mL  30 mL Oral Daily PRN Gillermo Murdoch, NP      . temazepam (RESTORIL) capsule 15 mg  15 mg Oral QHS PRN Gillermo Murdoch, NP   15 mg at 06/18/20 2126   PTA Medications: Medications Prior to Admission  Medication Sig Dispense Refill Last Dose  . cloZAPine (CLOZARIL) 200 MG tablet Take 1 tablet (200 mg total) by mouth at bedtime. 30 tablet 1   . divalproex (DEPAKOTE) 500 MG DR tablet Take 1 tablet (500 mg total) by mouth every 12 (twelve) hours. 60 tablet 1     Patient Stressors: Marital or family conflict Medication change or noncompliance  Patient Strengths: Geographical information systems officer for treatment/growth  Treatment Modalities: Medication Management, Group therapy, Case management,  1 to 1 session with clinician, Psychoeducation, Recreational  therapy.   Physician Treatment Plan for Primary Diagnosis: Schizoaffective disorder, bipolar type (HCC) Long Term Goal(s): Improvement in symptoms so as ready for discharge Improvement in symptoms so as ready for discharge   Short Term Goals: Ability to identify changes in lifestyle to reduce recurrence of condition will improve Ability to verbalize feelings will improve Ability to disclose and discuss suicidal ideas Ability to demonstrate self-control will improve Ability to identify and develop effective coping behaviors will improve Ability to maintain clinical measurements within normal limits will improve Compliance with prescribed medications will improve Ability to identify changes in lifestyle to reduce recurrence of condition will improve Ability to verbalize feelings will improve Ability to disclose and discuss suicidal ideas Ability to demonstrate self-control will improve Ability to identify and develop effective coping behaviors will improve Ability to maintain clinical measurements within normal limits will improve Compliance with prescribed medications will improve  Medication Management: Evaluate patient's response, side effects, and tolerance of medication regimen.  Therapeutic Interventions: 1 to 1 sessions, Unit Group sessions and Medication administration.  Evaluation of Outcomes: Progressing  Physician Treatment Plan for Secondary Diagnosis: Principal Problem:   Schizoaffective disorder, bipolar type (HCC) Active Problems:   Hypertension  Long Term Goal(s): Improvement in symptoms so as ready for discharge Improvement in symptoms so as ready for discharge   Short Term Goals: Ability to identify changes in lifestyle to reduce recurrence of condition will improve Ability to verbalize feelings will improve Ability to disclose and discuss suicidal ideas  Ability to demonstrate self-control will improve Ability to identify and develop effective coping behaviors  will improve Ability to maintain clinical measurements within normal limits will improve Compliance with prescribed medications will improve Ability to identify changes in lifestyle to reduce recurrence of condition will improve Ability to verbalize feelings will improve Ability to disclose and discuss suicidal ideas Ability to demonstrate self-control will improve Ability to identify and develop effective coping behaviors will improve Ability to maintain clinical measurements within normal limits will improve Compliance with prescribed medications will improve     Medication Management: Evaluate patient's response, side effects, and tolerance of medication regimen.  Therapeutic Interventions: 1 to 1 sessions, Unit Group sessions and Medication administration.  Evaluation of Outcomes: Progressing   RN Treatment Plan for Primary Diagnosis: Schizoaffective disorder, bipolar type (HCC) Long Term Goal(s): Knowledge of disease and therapeutic regimen to maintain health will improve  Short Term Goals: Ability to remain free from injury will improve, Ability to verbalize frustration and anger appropriately will improve, Ability to demonstrate self-control, Ability to participate in decision making will improve, Ability to verbalize feelings will improve, Ability to identify and develop effective coping behaviors will improve and Compliance with prescribed medications will improve  Medication Management: RN will administer medications as ordered by provider, will assess and evaluate patient's response and provide education to patient for prescribed medication. RN will report any adverse and/or side effects to prescribing provider.  Therapeutic Interventions: 1 on 1 counseling sessions, Psychoeducation, Medication administration, Evaluate responses to treatment, Monitor vital signs and CBGs as ordered, Perform/monitor CIWA, COWS, AIMS and Fall Risk screenings as ordered, Perform wound care treatments  as ordered.  Evaluation of Outcomes: Progressing   LCSW Treatment Plan for Primary Diagnosis: Schizoaffective disorder, bipolar type (HCC) Long Term Goal(s): Safe transition to appropriate next level of care at discharge, Engage patient in therapeutic group addressing interpersonal concerns.  Short Term Goals: Engage patient in aftercare planning with referrals and resources, Increase ability to appropriately verbalize feelings, Increase emotional regulation and Facilitate acceptance of mental health diagnosis and concerns  Therapeutic Interventions: Assess for all discharge needs, 1 to 1 time with Social worker, Explore available resources and support systems, Assess for adequacy in community support network, Educate family and significant other(s) on suicide prevention, Complete Psychosocial Assessment, Interpersonal group therapy.  Evaluation of Outcomes: Progressing   Progress in Treatment: Attending groups: No. Participating in groups: No. Taking medication as prescribed: Yes. Toleration medication: Yes. Family/Significant other contact made: No, will contact:  CSW will obtain permission to contact collateral contact  Patient understands diagnosis: Yes. Discussing patient identified problems/goals with staff: No. Medical problems stabilized or resolved: No. Denies suicidal/homicidal ideation: No. Issues/concerns per patient self-inventory: No. Other: none  New problem(s) identified: No, Describe:  none  New Short Term/Long Term Goal(s): elimination of symptoms of psychosis, medication management; development of comprehensive mental wellness/sobriety plan.  Patient Goals:  Patient declined to participate in Tx team meeting. Update 06/19/2020:  No changes at this time.   Discharge Plan or Barriers: Patient declined to participate in Tx team meeting.  CSW to follow-up at a later time. Update 06/19/2020:  Patient reports plans to return to his home with his mother.  He also reports  that he will continue with his ACTT team.     Reason for Continuation of Hospitalization: Anxiety Delusions  Medication stabilization  Estimated Length of Stay:  Recreational Therapy: Patient Stressors: N/A Patient Goal: Patient will engage in groups without prompting or encouragement from LRT x3  group sessions within 5 recreation therapy group sessions.  Attendees: Patient: 06/19/2020 9:17 AM  Physician: Les Pou, MD 06/19/2020 9:17 AM  Nursing:  06/19/2020 9:17 AM  RN Care Manager: 06/19/2020 9:17 AM  Social Worker: Regan Rakers, MSW, [place], Bridget Hartshorn  06/19/2020 9:17 AM  Recreational Therapist:  06/19/2020 9:17 AM  Other: Jillyn Hidden, MSW, LCSW, LCAS  06/19/2020 9:17 AM  Other: Penni Homans, MSW, LCSW 06/19/2020 9:17 AM  Other: 06/19/2020 9:17 AM    Scribe for Treatment Team: Harden Mo, LCSW 06/19/2020 9:17 AM

## 2020-06-19 NOTE — Progress Notes (Signed)
Patient is quiet and reserved. Pleasant and cooperative he received his medication and tolerated without incident. He denies SI  HI  AVH depression and anxiety at this encounter. He active in the dayroom and seen engaging with others. He is safe on the unit with 15 minute safety checks and encouraged to contact staff with any concerns.     Cleo Butler-Nicholson, LPN

## 2020-06-20 LAB — CBC WITH DIFFERENTIAL/PLATELET
Abs Immature Granulocytes: 0.05 10*3/uL (ref 0.00–0.07)
Basophils Absolute: 0 10*3/uL (ref 0.0–0.1)
Basophils Relative: 0 %
Eosinophils Absolute: 0 10*3/uL (ref 0.0–0.5)
Eosinophils Relative: 0 %
HCT: 47.6 % (ref 39.0–52.0)
Hemoglobin: 16.4 g/dL (ref 13.0–17.0)
Immature Granulocytes: 1 %
Lymphocytes Relative: 45 %
Lymphs Abs: 2.3 10*3/uL (ref 0.7–4.0)
MCH: 27.9 pg (ref 26.0–34.0)
MCHC: 34.5 g/dL (ref 30.0–36.0)
MCV: 81.1 fL (ref 80.0–100.0)
Monocytes Absolute: 0.4 10*3/uL (ref 0.1–1.0)
Monocytes Relative: 8 %
Neutro Abs: 2.4 10*3/uL (ref 1.7–7.7)
Neutrophils Relative %: 46 %
Platelets: 283 10*3/uL (ref 150–400)
RBC: 5.87 MIL/uL — ABNORMAL HIGH (ref 4.22–5.81)
RDW: 12.3 % (ref 11.5–15.5)
WBC: 5.2 10*3/uL (ref 4.0–10.5)
nRBC: 0 % (ref 0.0–0.2)

## 2020-06-20 MED ORDER — TEMAZEPAM 15 MG PO CAPS: 15.0000 mg | ORAL_CAPSULE | Freq: Every evening | ORAL | 1 refills | Status: DC | PRN

## 2020-06-20 MED ORDER — AMLODIPINE BESYLATE 5 MG PO TABS: 5.0000 mg | ORAL_TABLET | Freq: Every day | ORAL | 1 refills | Status: DC

## 2020-06-20 MED ORDER — CLOZAPINE 50 MG PO TABS: 50.0000 mg | ORAL_TABLET | Freq: Every day | ORAL | 1 refills | Status: DC

## 2020-06-20 MED ORDER — PALIPERIDONE PALMITATE ER 234 MG/1.5ML IM SUSY: 234.0000 mg | PREFILLED_SYRINGE | INTRAMUSCULAR | 3 refills | Status: DC

## 2020-06-20 MED ORDER — CLOZAPINE 200 MG PO TABS: 200.0000 mg | ORAL_TABLET | Freq: Every day | ORAL | 1 refills | Status: DC

## 2020-06-20 MED ORDER — DIVALPROEX SODIUM 500 MG PO DR TAB: 500.0000 mg | DELAYED_RELEASE_TABLET | Freq: Two times a day (BID) | ORAL | 1 refills | Status: DC

## 2020-06-20 NOTE — BHH Suicide Risk Assessment (Signed)
[facility] Discharge Suicide Risk Assessment   Principal Problem: Schizoaffective disorder, bipolar type [facility]) Discharge Diagnoses: Principal Problem:   Schizoaffective disorder, bipolar type (HCC) Active Problems:   Hypertension   Total Time spent with patient: 30 minutes  Musculoskeletal: Strength & Muscle Tone: within normal limits Gait & Station: normal Patient leans: N/A  Psychiatric Specialty Exam: Review of Systems  Blood pressure (!) 137/100, pulse 94, temperature 98.1 F (36.7 C), temperature source Oral, resp. rate 18, height 6' (1.829 m), weight 105.7 kg, SpO2 100 %.Body mass index is 31.6 kg/m.  General Appearance: Casual and Fairly Groomed  Patent attorney::  Good  Speech:  Clear and Coherent  Volume:  Normal  Mood:  Euthymic  Affect:  Congruent  Thought Process:  Coherent  Orientation:  Full (Time, Place, and Person)  Thought Content:  Logical  Suicidal Thoughts:  No  Homicidal Thoughts:  No  Memory:  Immediate;   Fair Recent;   Fair Remote;   Fair  Judgement:  Fair  Insight:  Shallow  Psychomotor Activity:  Normal  Concentration:  Fair  Recall:  [employer] of Knowledge:Fair  Language: Fair  Akathisia:  Negative  Handed:  Right  AIMS (if indicated):     Assets:  Communication Skills Desire for Improvement Financial Resources/Insurance Housing Physical Health Resilience Social Support  Sleep:  Number of Hours: 8  Cognition: WNL  ADL's:  Intact   Mental Status Per Nursing Assessment::   On Admission:  NA  Demographic Factors:  Male  Loss Factors: NA  Historical Factors: NA  Risk Reduction Factors:   Sense of responsibility to family, Religious beliefs about death, Living with another person, especially a relative, Positive social support, Positive therapeutic relationship and Positive coping skills or problem solving skills  Continued Clinical Symptoms:  Schizophrenia:   Paranoid or undifferentiated type Previous Psychiatric Diagnoses and  Treatments  Cognitive Features That Contribute To Risk:  Loss of executive function    Suicide Risk:  Minimal: No identifiable suicidal ideation.  Patients presenting with no risk factors but with morbid ruminations; may be classified as minimal risk based on the severity of the depressive symptoms   Follow-up Information    [address] [facility] [place] [employer]. Follow up.   Why: followup 21 Jun 2020 2 1300 at patient's home  Contact information: [address] [place] [address] [phone]               Plan Of Care/Follow-up recommendations:  Activity:  as tolerated Diet:  regular diet  Jesse Sans, MD 06/20/2020, 9:28 AM

## 2020-06-20 NOTE — Progress Notes (Signed)
Recreation Therapy Notes  INPATIENT RECREATION TR PLAN  Patient Details Name: [name] [name] MRN: [id number] DOB: Apr 14, 1996 Today's Date: 06/20/2020  Rec Therapy Plan Is patient appropriate for Therapeutic Recreation?: Yes Treatment times per week: at least 3 Estimated Length of Stay: 5-7days TR Treatment/Interventions: Group participation (Comment)  Discharge Criteria Pt will be discharged from therapy if:: Discharged Treatment plan/goals/alternatives discussed and agreed upon by:: Patient/family  Discharge Summary Short term goals set: Patient will engage in groups without prompting or encouragement from LRT x3 group sessions within 5 recreation therapy group sessions Short term goals met: Not met Reason goals not met: Patient did not attend any groups Therapeutic equipment acquired: N/A Reason patient discharged from therapy: Discharge from hospital Pt/family agrees with progress & goals achieved: Yes Date patient discharged from therapy: 06/20/20   Marlisha Vanwyk 06/20/2020, 1:26 PM

## 2020-06-20 NOTE — Progress Notes (Signed)
°  [facility] Adult Case Management Discharge Plan :  Will you be returning to the same living situation after discharge:  Yes,  Patient to return to home of residence listed on patient record. At discharge, do you have transportation home?: Yes,  CSW to arrange transprotation.  Do you have the ability to pay for your medications: Yes,  [place] [employer]  Release of information consent forms completed and in the chart;  Patient's signature needed at discharge.  Patient to Follow up at:  Follow-up Information    [address] [facility] [place] [employer] Follow up.   Why: followup 21 Jun 2020 2 1300 at patient's home  Contact information: [address] [place] [address] [phone]               Next level of care provider has access to [facility] Link:no  Safety Planning and Suicide Prevention discussed: Yes,  SPE completed with patient.  Have you used any form of tobacco in the last 30 days? (Cigarettes, Smokeless Tobacco, Cigars, and/or Pipes): No  Has patient been referred to the Quitline?: Patient refused referral  Patient has been referred for addiction treatment: N/A  Corky Crafts, LCSWA 06/20/2020, 9:03 AM

## 2020-06-20 NOTE — Plan of Care (Signed)
  Problem: BHH Concurrent Medical Problem Goal: STG-Compliance with medication and/or treatment as ordered Description: (STG-Compliance with medication and/or treatment as ordered by MD) Outcome: Progressing Goal: STG-Patient will participate in management/stabilization Description: (STG-Patient will participate in management/stabilization of medical condition) Outcome: Progressing   Problem: Education: Goal: Knowledge of the prescribed therapeutic regimen will improve Outcome: Progressing   Problem: Coping: Goal: Coping ability will improve Outcome: Progressing Goal: Will verbalize feelings Outcome: Progressing   Problem: Health Behavior/Discharge Planning: Goal: Compliance with prescribed medication regimen will improve Outcome: Progressing   Problem: Nutritional: Goal: Ability to achieve adequate nutritional intake will improve Outcome: Progressing   Problem: Role Relationship: Goal: Ability to communicate needs accurately will improve Outcome: Progressing Goal: Ability to interact with others will improve Outcome: Progressing   Problem: Self-Care: Goal: Ability to participate in self-care as condition permits will improve Outcome: Progressing   Problem: Self-Concept: Goal: Will verbalize positive feelings about self Outcome: Progressing

## 2020-06-20 NOTE — Plan of Care (Signed)
Pt denies depression, anxiety, SI, HI and AVH. Pt was educated on care plan and verbalizes understanding. Torrie Mayers RN Problem: Consults Goal: Concurrent Medical Patient Education Description: (See Patient Education Module for education specifics) 06/20/2020 1102 by Chalmers Cater, RN Outcome: Progressing 06/20/2020 1101 by Chalmers Cater, RN Outcome: Progressing   Problem: Activity: Goal: Will verbalize the importance of balancing activity with adequate rest periods 06/20/2020 1102 by Chalmers Cater, RN Outcome: Progressing 06/20/2020 1101 by Chalmers Cater, RN Outcome: Progressing   Problem: Education: Goal: Will be free of psychotic symptoms 06/20/2020 1102 by Chalmers Cater, RN Outcome: Progressing 06/20/2020 1101 by Chalmers Cater, RN Outcome: Progressing Goal: Knowledge of the prescribed therapeutic regimen will improve 06/20/2020 1102 by Chalmers Cater, RN Outcome: Progressing 06/20/2020 1101 by Chalmers Cater, RN Outcome: Progressing   Problem: Coping: Goal: Coping ability will improve 06/20/2020 1102 by Chalmers Cater, RN Outcome: Progressing 06/20/2020 1101 by Chalmers Cater, RN Outcome: Progressing Goal: Will verbalize feelings 06/20/2020 1102 by Chalmers Cater, RN Outcome: Progressing 06/20/2020 1101 by Chalmers Cater, RN Outcome: Progressing   Problem: Health Behavior/Discharge Planning: Goal: Compliance with prescribed medication regimen will improve 06/20/2020 1102 by Chalmers Cater, RN Outcome: Progressing 06/20/2020 1101 by Chalmers Cater, RN Outcome: Progressing   Problem: Nutritional: Goal: Ability to achieve adequate nutritional intake will improve 06/20/2020 1102 by Chalmers Cater, RN Outcome: Progressing 06/20/2020 1101 by Chalmers Cater, RN Outcome: Progressing   Problem: Role Relationship: Goal: Ability to communicate needs accurately will improve 06/20/2020 1102 by Chalmers Cater, RN Outcome: Progressing 06/20/2020  1101 by Chalmers Cater, RN Outcome: Progressing Goal: Ability to interact with others will improve 06/20/2020 1102 by Chalmers Cater, RN Outcome: Progressing 06/20/2020 1101 by Chalmers Cater, RN Outcome: Progressing   Problem: Safety: Goal: Ability to redirect hostility and anger into socially appropriate behaviors will improve 06/20/2020 1102 by Chalmers Cater, RN Outcome: Progressing 06/20/2020 1101 by Chalmers Cater, RN Outcome: Progressing Goal: Ability to remain free from injury will improve 06/20/2020 1102 by Chalmers Cater, RN Outcome: Progressing 06/20/2020 1101 by Chalmers Cater, RN Outcome: Progressing   Problem: Self-Care: Goal: Ability to participate in self-care as condition permits will improve 06/20/2020 1102 by Chalmers Cater, RN Outcome: Progressing 06/20/2020 1101 by Chalmers Cater, RN Outcome: Progressing   Problem: Self-Concept: Goal: Will verbalize positive feelings about self 06/20/2020 1102 by Chalmers Cater, RN Outcome: Progressing 06/20/2020 1101 by Chalmers Cater, RN Outcome: Progressing

## 2020-06-20 NOTE — Progress Notes (Signed)
Pt denies SI, HI and AVH. Pt was educated on dc plan and verbalizes understanding. Pt received dc packet, belongings and prescriptions. Torrie Mayers RN

## 2020-06-20 NOTE — Discharge Summary (Signed)
Physician Discharge Summary Note  Patient:  [name] [name] is [name] 24 y.o., male MRN:  [id number] DOB:  04/08/1996 Patient phone:  [phone] (home)  Patient address:   [address] [place] [address],  Total Time spent with patient: 30 minutes  Date of Admission:  06/13/2020 Date of Discharge: 06/20/2020  Reason for Admission:  [name] [name] 24 y.o.malewho presents to the ER, after he was petitioned for IVC [facility] setting of medication noncompliance.   Principal Problem: Schizoaffective disorder, bipolar type [facility]) Discharge Diagnoses: Principal Problem:   Schizoaffective disorder, bipolar type (HCC) Active Problems:   Hypertension   Past Psychiatric History: Patient has a past history of a diagnosis of schizophrenia or schizoaffective disorder. He has had multiple hospitalizations including having been in the hospital here Oct 2020. Patient has [name] ACT team with [facility], but remains medication non-compliant.. In the past he showed good response to clozapineHe usually has very poor insight into his illness. Has some aggression when psychotic but no suicide attempts. He also has a history of catatonia lasting for over a week. No history of substance abuse   Past Medical History:  Past Medical History:  Diagnosis Date  . Asthma   . Depression   . Psychosis (HCC)   . Schizoaffective disorder [facility])     Past Surgical History:  Procedure Laterality Date  . BACK SURGERY    . I&D groin  2017   Family History: History reviewed. No pertinent family history. Family Psychiatric  History: None reported Social History:  Social History   Substance and Sexual Activity  Alcohol Use No     Social History   Substance and Sexual Activity  Drug Use Never    Social History   Socioeconomic History  . Marital status: Single    Spouse name: Not on file  . Number of children: Not on file  . Years  of education: Not on file  . Highest education level: Not on file  Occupational History  . Not on file  Tobacco Use  . Smoking status: Never Smoker  . Smokeless tobacco: Never Used  Vaping Use  . Vaping Use: Never used  Substance and Sexual Activity  . Alcohol use: No  . Drug use: Never  . Sexual activity: Not Currently  Other Topics Concern  . Not on file  Social History Narrative  . Not on file   Social Determinants of Health   Financial Resource Strain:   . Difficulty of Paying Living Expenses: Not on file  Food Insecurity:   . Worried About Programme researcher, broadcasting/film/video in the Last Year: Not on file  . Ran Out of Food in the Last Year: Not on file  Transportation Needs:   . Lack of Transportation (Medical): Not on file  . Lack of Transportation (Non-Medical): Not on file  Physical Activity:   . Days of Exercise per Week: Not on file  . Minutes of Exercise per Session: Not on file  Stress:   . Feeling of Stress : Not on file  Social Connections:   . Frequency of Communication with Friends and Family: Not on file  . Frequency of Social Gatherings with Friends and Family: Not on file  . Attends Religious Services: Not on file  . Active Member of Clubs or Organizations: Not on file  . Attends Banker Meetings: Not on file  . Marital Status: Not on file  Hospital Course:  [name] [name] 24 y.o.malewho presents to the ER, after he was petitioned for IVC by [facility]. He has been off medications for approximately one month, and has become increasingly paranoid.Per the ACT Team, he stopped taking his medications and currently paranoid. When they went to visit him, he was afraid of them and hiding behind his mother. He's also afraid to be home alone and doesn't want his mother to going to work. Other times when they went to the house, the patient wouldn't let them in the home and told them, it was best for them and himself if  they leave. On admission, patient was agreeable to restarting his medications in the hospital. He restarted on Clozaril, and dose was titrated back to previous home dose of Clozaril 50 mg QAM and 200 mg QHS. His Invega Sustenna 234 mg IM injection was given on 06/14/20, next dose due 07/12/20. He tolerated restarting his medications without side effects, and found them helpful. At time of discharge he denies suicidal ideations, homicidal ideations, visual hallucinations, and auditory hallucinations. He is looking forward to returning home to live with his mother. He is also agreeable to following up with [facility].   Physical Findings: AIMS: Facial and Oral Movements Muscles of Facial Expression: None, normal Lips and Perioral Area: None, normal Jaw: None, normal Tongue: None, normal,Extremity Movements Upper (arms, wrists, hands, fingers): None, normal Lower (legs, knees, ankles, toes): None, normal, Trunk Movements Neck, shoulders, hips: None, normal, Overall Severity Severity of abnormal movements (highest score from questions above): None, normal Incapacitation due to abnormal movements: None, normal Patient's awareness of abnormal movements (rate only patient's report): No Awareness, Dental Status Current problems with teeth and/or dentures?: No Does patient usually wear dentures?: No  CIWA:  CIWA-Ar Total: 0 COWS:  COWS Total Score: 1  Musculoskeletal: Strength & Muscle Tone: within normal limits Gait & Station: normal Patient leans: N/A  Psychiatric Specialty Exam: Physical Exam Vitals and nursing note reviewed.  Constitutional:      Appearance: Normal appearance.  HENT:     Head: Normocephalic and atraumatic.     Right Ear: External ear normal.     Left Ear: External ear normal.     Nose: Nose normal.     Mouth/Throat:     Mouth: Mucous membranes are moist.     Pharynx: Oropharynx is clear.  Eyes:     Extraocular Movements: Extraocular movements intact.      Conjunctiva/sclera: Conjunctivae normal.     Pupils: Pupils are equal, round, and reactive to light.  Cardiovascular:     Rate and Rhythm: Normal rate.     Pulses: Normal pulses.  Pulmonary:     Effort: Pulmonary effort is normal.     Breath sounds: Normal breath sounds.  Abdominal:     General: Abdomen is flat.     Palpations: Abdomen is soft.  Musculoskeletal:        General: No swelling. Normal range of motion.     Cervical back: Normal range of motion.  Skin:    General: Skin is warm and dry.  Neurological:     General: No focal deficit present.     Mental Status: He is alert and oriented to person, place, and time.  Psychiatric:        Mood and Affect: Mood normal.        Behavior: Behavior normal.        Thought Content: Thought content  normal.        Judgment: Judgment normal.     Review of Systems  Constitutional: Negative for activity change and fatigue.  HENT: Negative for rhinorrhea and sore throat.   Eyes: Negative for photophobia and visual disturbance.  Respiratory: Negative for cough and shortness of breath.   Cardiovascular: Negative for chest pain and palpitations.  Gastrointestinal: Negative for constipation, diarrhea, nausea and vomiting.  Endocrine: Negative for cold intolerance and heat intolerance.  Genitourinary: Negative for difficulty urinating and dysuria.  Musculoskeletal: Negative for arthralgias and myalgias.  Skin: Negative for rash and wound.  Allergic/Immunologic: Negative for environmental allergies and food allergies.  Neurological: Negative for dizziness and headaches.  Hematological: Negative for adenopathy. Does not bruise/bleed easily.  Psychiatric/Behavioral: Negative for hallucinations, sleep disturbance and suicidal ideas.    Blood pressure (!) 137/100, pulse 94, temperature 98.1 F (36.7 C), temperature source Oral, resp. rate 18, height 6' (1.829 m), weight 105.7 kg, SpO2 100 %.Body mass index is 31.6 kg/m.  General Appearance:  Casual and Fairly Groomed  Patent attorney::  Good  Speech:  Clear and Coherent  Volume:  Normal  Mood:  Euthymic  Affect:  Congruent  Thought Process:  Coherent  Orientation:  Full (Time, Place, and Person)  Thought Content:  Logical  Suicidal Thoughts:  No  Homicidal Thoughts:  No  Memory:  Immediate;   Fair Recent;   Fair Remote;   Fair  Judgement:  Fair  Insight:  Shallow  Psychomotor Activity:  Normal  Concentration:  Fair  Recall:  Fair  Fund of Knowledge:Fair  Language: Fair  Akathisia:  Negative  Handed:  Right  AIMS (if indicated):     Assets:  Communication Skills Desire for Improvement Financial Resources/Insurance Housing Physical Health Resilience Social Support  Sleep:  Number of Hours: 8  Cognition: WNL  ADL's:  Intact        Have you used any form of tobacco in the last 30 days? (Cigarettes, Smokeless Tobacco, Cigars, and/or Pipes): No  Has this patient used any form of tobacco in the last 30 days? (Cigarettes, Smokeless Tobacco, Cigars, and/or Pipes) No  Blood Alcohol level:  Lab Results  Component Value Date   ETH <10 06/13/2020   ETH <10 08/14/2019    Metabolic Disorder Labs:  Lab Results  Component Value Date   HGBA1C 4.9 06/18/2020   MPG 93.93 06/18/2020   MPG 99.67 08/10/2018   No results found for: PROLACTIN Lab Results  Component Value Date   CHOL 157 06/18/2020   TRIG 83 06/18/2020   HDL 45 06/18/2020   CHOLHDL 3.5 06/18/2020   VLDL 17 06/18/2020   LDLCALC 95 06/18/2020   LDLCALC 96 05/05/2019    See Psychiatric Specialty Exam and Suicide Risk Assessment completed by Attending Physician prior to discharge.  Discharge destination:  Home  Is patient on multiple antipsychotic therapies at discharge:  Yes,   Do you recommend tapering to monotherapy for antipsychotics?  No   Has Patient had three or more failed trials of antipsychotic monotherapy by history:  Yes,   Antipsychotic medications that previously failed include:   1.   Clozapine. and 2.  Invega.  Recommended Plan for Multiple Antipsychotic Therapies: NA  Discharge Instructions    Diet general   Complete by: As directed    Increase activity slowly   Complete by: As directed      Allergies as of 06/20/2020   No Known Allergies     Medication List  TAKE these medications     Indication  amLODipine 5 MG tablet Commonly known as: NORVASC Take 1 tablet (5 mg total) by mouth daily.  Indication: High Blood Pressure Disorder   clozapine 200 MG tablet Commonly known as: CLOZARIL Take 1 tablet (200 mg total) by mouth at bedtime. What changed: Another medication with the same name was added. Make sure you understand how and when to take each.  Indication: Schizoaffective Disorder   clozapine 50 MG tablet Commonly known as: CLOZARIL Take 1 tablet (50 mg total) by mouth daily. What changed: You were already taking a medication with the same name, and this prescription was added. Make sure you understand how and when to take each.  Indication: Schizoaffective Disorder   divalproex 500 MG DR tablet Commonly known as: DEPAKOTE Take 1 tablet (500 mg total) by mouth every 12 (twelve) hours.  Indication: Schizophrenia   paliperidone 234 MG/1.5ML Susy injection Commonly known as: INVEGA SUSTENNA Inject 234 mg into the muscle every 28 (twenty-eight) days. Start taking on: July 11, 2020  Indication: Schizoaffective Disorder   temazepam 15 MG capsule Commonly known as: RESTORIL Take 1 capsule (15 mg total) by mouth at bedtime as needed for sleep.  Indication: Trouble Sleeping       Follow-up Information    [name] [place] [employer] Follow up.   Why: followup 21 Jun 2020 2 1300 at patient's home  Contact information: [address] [place] [place] [address] [phone]               Follow-up recommendations:  Activity:  as tolerated Diet:  regular diet  Comments: 30-day scripts with [facility]. Gean BirchwoodInvega Sustenna 234 mg IM injection given 06/14/20 with next dose due 07/12/20. CBC completed 06/20/20 with ANC 3.4  Signed: Jesse SansMegan M Lukisha Procida, MD 06/20/2020, 9:32 AM

## 2020-06-20 NOTE — Progress Notes (Signed)
Patient is pleasant and cooperative.  He is alert and oriented  X4. He denies SI  HI  AVH depression anxiety and pain at this encounter.  He is med compliant and tolerated his meds without incident.  He is clear and logical in his speech and thoughts. He has been isolative to his room,  but will engage with others when approached. He is safe on the unit with 15 minute safety checks and was encouraged to contact staff with any concerns.       Cleo Butler-Nicholson, LPN

## 2020-06-20 NOTE — BHH Counselor (Signed)
CSW faxed labs, discharge summary and current medication list to [facility] as requested.  CSW received confirmation the fax was successful.  Penni Homans, MSW, LCSW 06/20/2020 11:59 AM

## 2020-06-20 NOTE — Plan of Care (Signed)
Problem: Group Participation °Goal: STG - Patient will engage in groups without prompting or encouragement from LRT x3 group sessions within 5 recreation therapy group sessions °Description: STG - Patient will engage in groups without prompting or encouragement from LRT x3 group sessions within 5 recreation therapy group sessions °06/20/2020 1325 by , , LRT °Outcome: Not Applicable °06/20/2020 1324 by , , LRT °Outcome: Not Met (add Reason) °Note: Patient did not attend any groups.  °  °

## 2020-06-21 LAB — CLOZAPINE (CLOZARIL)
Clozapine Lvl: 182 ng/mL — ABNORMAL LOW (ref 350–650)
NorClozapine: 77 ng/mL
Total(Cloz+Norcloz): 259 ng/mL

## 2020-06-23 ENCOUNTER — Encounter (HOSPITAL_COMMUNITY): Payer: Self-pay | Admitting: Psychiatry

## 2020-07-18 ENCOUNTER — Emergency Department
Admission: EM | Admit: 2020-07-18 | Discharge: 2020-07-20 | Disposition: A | Discharge status: Home / Self Care | Payer: Medicaid Other | Attending: Emergency Medicine | Admitting: Emergency Medicine

## 2020-07-18 ENCOUNTER — Encounter: Payer: Self-pay | Admitting: Emergency Medicine

## 2020-07-18 ENCOUNTER — Other Ambulatory Visit: Payer: Self-pay

## 2020-07-18 DIAGNOSIS — J45909 Unspecified asthma, uncomplicated: Secondary | ICD-10-CM | POA: Insufficient documentation

## 2020-07-18 DIAGNOSIS — F329 Major depressive disorder, single episode, unspecified: Secondary | ICD-10-CM | POA: Diagnosis not present

## 2020-07-18 DIAGNOSIS — F250 Schizoaffective disorder, bipolar type: Secondary | ICD-10-CM | POA: Diagnosis not present

## 2020-07-18 DIAGNOSIS — Z20822 Contact with and (suspected) exposure to covid-19: Secondary | ICD-10-CM | POA: Diagnosis not present

## 2020-07-18 DIAGNOSIS — Z79899 Other long term (current) drug therapy: Secondary | ICD-10-CM | POA: Diagnosis not present

## 2020-07-18 DIAGNOSIS — Z046 Encounter for general psychiatric examination, requested by authority: Secondary | ICD-10-CM | POA: Diagnosis present

## 2020-07-18 DIAGNOSIS — I10 Essential (primary) hypertension: Secondary | ICD-10-CM | POA: Insufficient documentation

## 2020-07-18 LAB — COMPREHENSIVE METABOLIC PANEL
ALT: 23 U/L (ref 0–44)
AST: 20 U/L (ref 15–41)
Albumin: 4.6 g/dL (ref 3.5–5.0)
Alkaline Phosphatase: 77 U/L (ref 38–126)
Anion gap: 11 (ref 5–15)
BUN: 14 mg/dL (ref 6–20)
CO2: 24 mmol/L (ref 22–32)
Calcium: 9.3 mg/dL (ref 8.9–10.3)
Chloride: 105 mmol/L (ref 98–111)
Creatinine, Ser: 0.96 mg/dL (ref 0.61–1.24)
GFR, Estimated: >60 mL/min (ref >60)
Glucose, Bld: 101 mg/dL — ABNORMAL HIGH (ref 70–99)
Potassium: 3.9 mmol/L (ref 3.5–5.1)
Sodium: 140 mmol/L (ref 135–145)
Total Bilirubin: 1 mg/dL (ref 0.3–1.2)
Total Protein: 8.2 g/dL — ABNORMAL HIGH (ref 6.5–8.1)

## 2020-07-18 LAB — CBC
HCT: 44.4 % (ref 39.0–52.0)
Hemoglobin: 15.1 g/dL (ref 13.0–17.0)
MCH: 27.8 pg (ref 26.0–34.0)
MCHC: 34 g/dL (ref 30.0–36.0)
MCV: 81.8 fL (ref 80.0–100.0)
Platelets: 277 10*3/uL (ref 150–400)
RBC: 5.43 MIL/uL (ref 4.22–5.81)
RDW: 12.2 % (ref 11.5–15.5)
WBC: 6.5 10*3/uL (ref 4.0–10.5)
nRBC: 0 % (ref 0.0–0.2)

## 2020-07-18 LAB — RESP PANEL BY RT-PCR (FLU A&B, COVID) ARPGX2
Influenza A by PCR: NEGATIVE
Influenza B by PCR: NEGATIVE
SARS Coronavirus 2 by RT PCR: NEGATIVE

## 2020-07-18 LAB — ETHANOL: Alcohol, Ethyl (B): <10 mg/dL (ref <10)

## 2020-07-18 LAB — SALICYLATE LEVEL: Salicylate Lvl: <7 mg/dL — ABNORMAL LOW (ref 7.0–30.0)

## 2020-07-18 LAB — ACETAMINOPHEN LEVEL: Acetaminophen (Tylenol), Serum: <10 ug/mL — ABNORMAL LOW (ref 10–30)

## 2020-07-18 NOTE — ED Notes (Signed)
Asked pt that we needed to dress him out. Pt states, "no, I am not dressing for the simple fact, that I am going to put my clothes right back on. They let me stay out of my clothes before." Told the pt that he was under IVC but pt still refusing. Dr. Larinda Buttery, MD made aware.

## 2020-07-18 NOTE — ED Notes (Signed)
Snack and beverage given. 
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
## 2020-07-18 NOTE — ED Notes (Signed)
Belongings include:  1 black jacket, 1 black t-shirt, 1 black boxers, jeans, a pair multi-colored socks, and pair of brown shoes.

## 2020-07-18 NOTE — ED Notes (Signed)
Pt called this RN to the room. Pt states, "can you get me my room in the psych ward. Explained to pt that he needs to be patient and he need to be evaluated by the EDP first, then they would consult psychiatry and see if it necessary to admit pt to the BMU.
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
## 2020-07-18 NOTE — ED Notes (Signed)
Pt. To BHU from ED ambulatory without difficulty, to room  BHU5. Report from [facility] Pt. Is alert and oriented, warm and dry in no distress. Pt. Denies SI, HI, and AVH. Pt. Calm and cooperative. Pt. Made aware of security cameras and Q15 minute rounds. Pt. Encouraged to let Nursing staff know of any concerns or needs.   ENVIRONMENTAL ASSESSMENT Potentially harmful objects out of patient reach: Yes.   Personal belongings secured: Yes.   Patient dressed in hospital provided attire only: Yes.   Plastic bags out of patient reach: Yes.   Patient care equipment (cords, cables, call bells, lines, and drains) shortened, removed, or accounted for: Yes.   Equipment and supplies removed from bottom of stretcher: Yes.   Potentially toxic materials out of patient reach: Yes.   Sharps container removed or out of patient reach: Yes.

## 2020-07-18 NOTE — ED Notes (Signed)
Report to include Situation, Background, Assessment, and Recommendations received from [facility]. Patient alert and oriented, warm and dry, in no acute distress. Patient denies SI, HI, AVH and pain. Patient has hx of schizophrenia. Patient is responding to internal stimuli.  Patient made aware of Q15 minute rounds and Psychologist, counselling presence for their safety. Patient instructed to come to me with needs or concerns.

## 2020-07-18 NOTE — ED Notes (Signed)
Report from [facility]. Patient to be transferred to [facility] room 5.

## 2020-07-19 DIAGNOSIS — F250 Schizoaffective disorder, bipolar type: Secondary | ICD-10-CM

## 2020-07-19 LAB — LIPID PANEL
Cholesterol: 256 mg/dL — ABNORMAL HIGH (ref 0–200)
HDL: 52 mg/dL (ref >40)
LDL Cholesterol: 177 mg/dL — ABNORMAL HIGH (ref 0–99)
Total CHOL/HDL Ratio: 4.9 RATIO
Triglycerides: 137 mg/dL (ref <150)
VLDL: 27 mg/dL (ref 0–40)

## 2020-07-19 LAB — HEMOGLOBIN A1C
Hgb A1c MFr Bld: 4.8 % (ref 4.8–5.6)
Mean Plasma Glucose: 91.06 mg/dL

## 2020-07-19 MED ORDER — TEMAZEPAM 7.5 MG PO CAPS
15.0000 mg | ORAL_CAPSULE | Freq: Every evening | ORAL | Status: DC | PRN
  Filled 2020-07-19: qty 2

## 2020-07-19 MED ORDER — CLOZAPINE 100 MG PO TABS
200.0000 mg | ORAL_TABLET | Freq: Every day | ORAL | Status: DC
  Administered 2020-07-19: 22:00:00 200 mg via ORAL
  Filled 2020-07-19: qty 2

## 2020-07-19 MED ORDER — PALIPERIDONE PALMITATE ER 234 MG/1.5ML IM SUSY
234.0000 mg | PREFILLED_SYRINGE | Freq: Once | INTRAMUSCULAR | Status: DC
  Administered 2020-07-20: 234 mg via INTRAMUSCULAR
  Filled 2020-07-19 (×3): qty 1.5

## 2020-07-19 MED ORDER — DIVALPROEX SODIUM 500 MG PO DR TAB
500.0000 mg | DELAYED_RELEASE_TABLET | Freq: Two times a day (BID) | ORAL | Status: DC
  Administered 2020-07-19 – 2020-07-20 (×2): 500 mg via ORAL
  Filled 2020-07-19 (×2): qty 1

## 2020-07-19 MED ORDER — TEMAZEPAM 7.5 MG PO CAPS
15.0000 mg | ORAL_CAPSULE | Freq: Every evening | ORAL | Status: DC | PRN
  Administered 2020-07-19 (×2): 15 mg via ORAL
  Filled 2020-07-19: qty 2

## 2020-07-19 MED ORDER — CLOZAPINE 25 MG PO TABS
50.0000 mg | ORAL_TABLET | Freq: Every day | ORAL | Status: DC
  Administered 2020-07-20: 09:00:00 50 mg via ORAL
  Filled 2020-07-19: qty 2

## 2020-07-19 MED ORDER — AMLODIPINE BESYLATE 5 MG PO TABS
5.0000 mg | ORAL_TABLET | Freq: Every day | ORAL | Status: DC
  Administered 2020-07-20: 09:00:00 5 mg via ORAL
  Filled 2020-07-19: qty 1

## 2020-07-19 NOTE — ED Notes (Signed)
Shower supplies given. Pt is currently taking a shower. 

## 2020-07-19 NOTE — ED Notes (Signed)
VS assessed this morning. Shower offered. Breakfast tray given.

## 2020-07-19 NOTE — BH Assessment (Addendum)
Referral information for Psychiatric Hospitalization faxed to:  . [facility] BMU- Per unit, No appropriate bed available due high acuity  .[facility] ([phone])Pt secure chat to Kenshia (AC) & Danika, RN for review   [facility] ([phone]-or- [phone]),   [place] ([phone]),   [place] ([phone] -or- [phone]),   [place] ([phone]---[phone]---[phone]),   [place] ([phone] or [phone])  [place] ([phone] or [phone])   
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
## 2020-07-19 NOTE — ED Notes (Signed)
Patient pacing in day room.  Patient redirected by writer he must stay in room unless he is using restroom and coming to nurses station.

## 2020-07-19 NOTE — ED Notes (Signed)
pt asleep at this moment. VS will be obtain when pt is awake. 

## 2020-07-19 NOTE — ED Notes (Signed)
Dinner tray given. Pt stated, "I don't eat fish." This tech made him aware that it is a chicken sandwich. Pt placed his tray in the restroom floor and walked away.

## 2020-07-19 NOTE — ED Notes (Signed)
Sandwich tray was given to pt with a drink.

## 2020-07-19 NOTE — ED Notes (Addendum)
Lunch tray given. 
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
## 2020-07-19 NOTE — ED Notes (Signed)
Patient redirected by this Clinical research associate and staff that he needs to stay in his room. That day room is closed until the morning.  Patient asked to take a shower. Writer advised patient he can take a shower in the morning. Patient went to his room.

## 2020-07-19 NOTE — ED Notes (Signed)
Pt IVC/pending psych consult. 

## 2020-07-19 NOTE — ED Notes (Signed)

## 2020-07-19 NOTE — ED Notes (Signed)
Patient attempted to go into another patient's room. Patient was redirected by security that he is not allowed to go into other patient's room and he needs to go to his room.

## 2020-07-19 NOTE — Progress Notes (Signed)
CLOZAPINE MONITORING  Clozapine 200mg  at bedtime and 50mg  daily ordered.  Will check ANC with AM labs 07/20/20  Check ANC at least weekly while inpatient.   For general population patients, i.e., those without benign ethnic neutropenia (BEN): --If ANC 1000-1499, increase ANC monitoring to 3x/wk --If ANC < 1000, HOLD CLOZAPINE and get psych consult   For patients with BEN: --If ANC 500-999, increase ANC monitoring to 3x/wk --If ANC < 500, HOLD CLOZAPINE and get psych consult  REMS-certified psychiatry provider can continue drug with ANC below cited thresholds if they document medical opinion that the neutropenia is not clozapine-induced (heme consult is recommended) or that risk of interrupting therapy is greater than the risk of developing severe neutropenia.  , PharmD, BCPS 07/19/2020 4:23 PM

## 2020-07-20 ENCOUNTER — Other Ambulatory Visit: Payer: Self-pay

## 2020-07-20 ENCOUNTER — Inpatient Hospital Stay (HOSPITAL_COMMUNITY)
Admission: AD | Admit: 2020-07-20 | Discharge: 2020-07-25 | DRG: 885 | Disposition: A | Discharge status: Home / Self Care | Payer: Medicaid Other | Source: Intra-hospital | Attending: Psychiatry | Admitting: Psychiatry

## 2020-07-20 ENCOUNTER — Encounter (HOSPITAL_COMMUNITY): Payer: Self-pay | Admitting: Psychiatry

## 2020-07-20 DIAGNOSIS — R451 Restlessness and agitation: Secondary | ICD-10-CM | POA: Diagnosis present

## 2020-07-20 DIAGNOSIS — G4700 Insomnia, unspecified: Secondary | ICD-10-CM | POA: Diagnosis present

## 2020-07-20 DIAGNOSIS — I10 Essential (primary) hypertension: Secondary | ICD-10-CM | POA: Diagnosis present

## 2020-07-20 DIAGNOSIS — Z9114 Patient's other noncompliance with medication regimen: Secondary | ICD-10-CM | POA: Diagnosis not present

## 2020-07-20 DIAGNOSIS — F419 Anxiety disorder, unspecified: Secondary | ICD-10-CM | POA: Diagnosis present

## 2020-07-20 DIAGNOSIS — F250 Schizoaffective disorder, bipolar type: Secondary | ICD-10-CM | POA: Diagnosis present

## 2020-07-20 DIAGNOSIS — J45909 Unspecified asthma, uncomplicated: Secondary | ICD-10-CM | POA: Diagnosis present

## 2020-07-20 MED ORDER — PALIPERIDONE PALMITATE ER 234 MG/1.5ML IM SUSY: 234.0000 mg | PREFILLED_SYRINGE | Freq: Once | INTRAMUSCULAR | Status: DC

## 2020-07-20 MED ORDER — TEMAZEPAM 15 MG PO CAPS: 15.0000 mg | ORAL_CAPSULE | Freq: Every evening | ORAL | Status: DC | PRN

## 2020-07-20 MED ORDER — DIPHENHYDRAMINE HCL 25 MG PO CAPS
50.0000 mg | ORAL_CAPSULE | Freq: Every day | ORAL | Status: DC | PRN
  Administered 2020-07-21: 50 mg via ORAL
  Filled 2020-07-20: qty 2

## 2020-07-20 MED ORDER — LORAZEPAM 1 MG PO TABS
2.0000 mg | ORAL_TABLET | Freq: Every day | ORAL | Status: DC | PRN
  Administered 2020-07-21 – 2020-07-24 (×3): 2 mg via ORAL
  Filled 2020-07-20 (×4): qty 2

## 2020-07-20 MED ORDER — HYDROXYZINE HCL 50 MG PO TABS
50.0000 mg | ORAL_TABLET | Freq: Four times a day (QID) | ORAL | Status: DC | PRN
  Administered 2020-07-21 – 2020-07-24 (×4): 50 mg via ORAL
  Filled 2020-07-20 (×5): qty 1

## 2020-07-20 MED ORDER — CLOZAPINE 25 MG PO TABS
50.0000 mg | ORAL_TABLET | Freq: Every day | ORAL | Status: DC
  Administered 2020-07-21 – 2020-07-25 (×5): 50 mg via ORAL
  Filled 2020-07-20 (×6): qty 2

## 2020-07-20 MED ORDER — MAGNESIUM HYDROXIDE 400 MG/5ML PO SUSP: 30.0000 mL | Freq: Every day | ORAL | Status: DC | PRN

## 2020-07-20 MED ORDER — HALOPERIDOL 5 MG PO TABS
10.0000 mg | ORAL_TABLET | Freq: Every day | ORAL | Status: DC | PRN
  Administered 2020-07-23: 10 mg via ORAL
  Filled 2020-07-20 (×2): qty 2

## 2020-07-20 MED ORDER — CLOZAPINE 100 MG PO TABS
200.0000 mg | ORAL_TABLET | Freq: Every day | ORAL | Status: DC
  Administered 2020-07-20 – 2020-07-23 (×4): 200 mg via ORAL
  Filled 2020-07-20 (×6): qty 2

## 2020-07-20 MED ORDER — ALUM & MAG HYDROXIDE-SIMETH 200-200-20 MG/5ML PO SUSP: 30.0000 mL | ORAL | Status: DC | PRN

## 2020-07-20 MED ORDER — HALOPERIDOL LACTATE 5 MG/ML IJ SOLN
10.0000 mg | Freq: Every day | INTRAMUSCULAR | Status: DC | PRN
  Administered 2020-07-21 – 2020-07-22 (×2): 10 mg via INTRAMUSCULAR
  Filled 2020-07-20 (×2): qty 2

## 2020-07-20 MED ORDER — DIPHENHYDRAMINE HCL 50 MG/ML IJ SOLN
50.0000 mg | Freq: Every day | INTRAMUSCULAR | Status: DC | PRN
  Administered 2020-07-22: 50 mg via INTRAMUSCULAR
  Filled 2020-07-20: qty 1

## 2020-07-20 MED ORDER — TEMAZEPAM 15 MG PO CAPS
15.0000 mg | ORAL_CAPSULE | Freq: Every evening | ORAL | Status: DC | PRN
  Administered 2020-07-20 – 2020-07-24 (×4): 15 mg via ORAL
  Filled 2020-07-20 (×4): qty 1

## 2020-07-20 MED ORDER — LORAZEPAM 2 MG/ML IJ SOLN
2.0000 mg | Freq: Every day | INTRAMUSCULAR | Status: DC | PRN
  Administered 2020-07-21 – 2020-07-22 (×2): 2 mg via INTRAMUSCULAR
  Filled 2020-07-20 (×2): qty 1

## 2020-07-20 MED ORDER — DIVALPROEX SODIUM 500 MG PO DR TAB
500.0000 mg | DELAYED_RELEASE_TABLET | Freq: Two times a day (BID) | ORAL | Status: DC
  Administered 2020-07-20 – 2020-07-25 (×10): 500 mg via ORAL
  Filled 2020-07-20 (×13): qty 1

## 2020-07-20 MED ORDER — ACETAMINOPHEN 325 MG PO TABS: 650.0000 mg | ORAL_TABLET | Freq: Four times a day (QID) | ORAL | Status: DC | PRN

## 2020-07-20 MED ORDER — AMLODIPINE BESYLATE 5 MG PO TABS
5.0000 mg | ORAL_TABLET | Freq: Every day | ORAL | Status: DC
  Administered 2020-07-21 – 2020-07-22 (×2): 5 mg via ORAL
  Filled 2020-07-20 (×5): qty 1

## 2020-07-20 NOTE — Tx Team (Signed)
Initial Treatment Plan 07/20/2020 7:51 PM [name] [name] MRA:[id number]    PATIENT STRESSORS: Medication change or noncompliance   PATIENT STRENGTHS: Supportive family/friends Other: Has ACTT   PATIENT IDENTIFIED PROBLEMS: Psychosis    "I don't know"                 DISCHARGE CRITERIA:  Improved stabilization in mood, thinking, and/or behavior Need for constant or close observation no longer present Reduction of life-threatening or endangering symptoms to within safe limits Verbal commitment to aftercare and medication compliance  PRELIMINARY DISCHARGE PLAN: Outpatient therapy  PATIENT/FAMILY INVOLVEMENT: This treatment plan has been presented to and reviewed with the patient, [name] [name].  The patient and family have been given the opportunity to ask questions and make suggestions.  Levin Bacon, RN 07/20/2020, 7:51 PM

## 2020-07-20 NOTE — ED Notes (Signed)
Patient transferred to [facility], He had invega injection prior to transport that He tolerated without difficulty, all belongings given to transporter, patient without signs of distress. Nurse had called report to [facility] prior to discharge.

## 2020-07-20 NOTE — BH Assessment (Addendum)
Referral Check:  [facility]- Per unit, No appropriate bed available due high acuity  .[facility] [phone])Per [facility] AC no appropriate bed available   [name] ([phone]- [phone]), [name] reports denied due to Psychosis and history of aggression   [facility] [phone]),   [name] [phone] -or- [phone]), No answer  [name] ([phone]), No intake staff available until morning  [facility] [phone] or [phone]) Not accepting outside referrals  [facility] [phone] or [phone]) No answer

## 2020-07-20 NOTE — Progress Notes (Signed)
Per previous shift pt has been observed in various male rooms. Pt was redirected to not go into male rooms on night shift. Pt pacing the unit, pt disorganized at times, but pt appeared to remember writer from [facility].     07/20/20 2200  Psych Admission Type (Psych Patients Only)  Admission Status Involuntary  Psychosocial Assessment  Patient Complaints Anxiety  Eye Contact Brief;Staring  Facial Expression Flat  Affect Flat  Speech Unremarkable  Interaction Cautious;Guarded;Minimal  Motor Activity Fidgety;Pacing  Appearance/Hygiene Unremarkable  Behavior Characteristics Fidgety;Hyperactive  Mood Suspicious  Thought Process  Coherency WDL  Content Other (Comment) (would only answer questions with "I don't know")  Delusions None reported or observed  Perception Hallucinations (pt denied but looks like he is responding to internal stimuli)  Hallucination None reported or observed (appears to be responding)  Judgment Limited  Confusion None  Danger to Self  Current suicidal ideation? Denies  Danger to Others  Danger to Others None reported or observed

## 2020-07-20 NOTE — ED Notes (Addendum)
Patient has been sleeping, nurse woke him up to obtain v/s, He was pleasant, and interacted well with nurse,, Nurse will continue to monitor, Patient is safe, camera surveillance in progress for safety as well.
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
## 2020-07-20 NOTE — ED Notes (Signed)
Pt is restless, states he is "bored." Frequent wandering to window and back to room to ask what time breakfast is served, or other questions. Pt given milk and cereal per request.   Upon assessment, pt is wearing scrub bottoms on head. Pt is polite and cooperative, answers questions appropriately. Denies intent to harm self or others. Denies AH/VH. Pt in room at this time

## 2020-07-20 NOTE — ED Notes (Signed)
Patient took shower.

## 2020-07-20 NOTE — ED Notes (Signed)
Pt given dinner tray at this time.  

## 2020-07-20 NOTE — ED Notes (Signed)
Pt taking shower that he missed this morning at this time.
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
## 2020-07-20 NOTE — ED Notes (Signed)
Unable to obtain vitals due to patient sleeping. Will continue to monitor.   

## 2020-07-21 DIAGNOSIS — F250 Schizoaffective disorder, bipolar type: Secondary | ICD-10-CM

## 2020-07-21 MED ORDER — HALOPERIDOL 5 MG PO TABS
10.0000 mg | ORAL_TABLET | ORAL | Status: AC
  Filled 2020-07-21 (×2): qty 2

## 2020-07-21 MED ORDER — HALOPERIDOL 5 MG PO TABS
10.0000 mg | ORAL_TABLET | ORAL | Status: AC
  Administered 2020-07-21: 10 mg via ORAL
  Filled 2020-07-21: qty 2

## 2020-07-21 NOTE — Progress Notes (Signed)
[name] [name] has been up and visible on the unit.  He continues to pace the floor.  He became agitated with peer and became threatening.  Posturing and attempting to attack multiple time.  Show of support needed to keep staff/patients safe. He was difficult to redirect.  IM medications administered with good relief.  He appears to be responding to internal stimuli and paranoid.  He took his medications today without difficulty.  Medications given as ordered.  15 minute checks maintained for safety.  We will continue to monitor the progress towards his goals.

## 2020-07-21 NOTE — Progress Notes (Signed)
Pt awakened by commotion on the unit pt given PRN 50 mg Benadryl  2mg  Ativan per [facility]

## 2020-07-21 NOTE — [facility] Counselor (Signed)
[facility] LCSW Note  07/21/2020   12:45 PM  Type of Contact and Topic:  PSA Attempt  CSW attempted to meet with pt in efforts to complete PSA. Pt proved unable to engage due to being in/out of sleep. Pt requested to attempt at a later time.  CSW team will continue efforts to complete PSA with pt at a later time.    Leisa Lenz, LCSW 07/21/2020  12:45 PM

## 2020-07-21 NOTE — Progress Notes (Signed)
Recreation Therapy Notes  12.31.21 Pt unable to complete recreation therapy assessment at this time.  LRT will attempt to assess pt at a later time.    Caroll Rancher, LRT/CTRS    Caroll Rancher A 07/21/2020 10:31 AM

## 2020-07-21 NOTE — Tx Team (Signed)
Interdisciplinary Treatment and Diagnostic Plan Update  07/21/2020 Time of Session: 9:55am   [name] [name] MRN: [id number]  Principal Diagnosis: <principal problem not specified>  Secondary Diagnoses: Active Problems:   Schizoaffective disorder, bipolar type ([place])   Current Medications:  Current Facility-Administered Medications  Medication Dose Route Frequency Provider Last Rate Last Admin  . acetaminophen (TYLENOL) tablet 650 mg  650 mg Oral Q6H PRN [name], [name], MD      . alum & mag hydroxide-simeth (MAALOX/MYLANTA) 200-200-20 MG/5ML suspension 30 mL  30 mL Oral Q4H PRN [name], [name], MD      . amLODipine (NORVASC) tablet 5 mg  5 mg Oral Daily [name], Madie Reno, MD   5 mg at 07/21/20 0933  . cloZAPine (CLOZARIL) tablet 200 mg  200 mg Oral QHS [name], [name], MD   200 mg at 07/20/20 2051  . cloZAPine (CLOZARIL) tablet 50 mg  50 mg Oral Daily [name], Madie Reno, MD   50 mg at 07/21/20 0938  . diphenhydrAMINE (BENADRYL) capsule 50 mg  50 mg Oral Daily PRN Connye Burkitt, NP   50 mg at 07/21/20 0107   Or  . diphenhydrAMINE (BENADRYL) injection 50 mg  50 mg Intramuscular Daily PRN Connye Burkitt, NP      . divalproex (DEPAKOTE) DR tablet 500 mg  500 mg Oral Q12H [name], [name], MD   500 mg at 07/21/20 1829  . haloperidol (HALDOL) tablet 10 mg  10 mg Oral Daily PRN Connye Burkitt, NP       Or  . haloperidol lactate (HALDOL) injection 10 mg  10 mg Intramuscular Daily PRN Connye Burkitt, NP      . haloperidol (HALDOL) tablet 10 mg  10 mg Oral NOW Sharma Covert, MD      . hydrOXYzine (ATARAX/VISTARIL) tablet 50 mg  50 mg Oral Q6H PRN [name], [name], MD      . LORazepam (ATIVAN) tablet 2 mg  2 mg Oral Daily PRN Connye Burkitt, NP   2 mg at 07/21/20 0108   Or  . LORazepam (ATIVAN) injection 2 mg  2 mg Intramuscular Daily PRN Connye Burkitt, NP      . magnesium hydroxide (MILK OF MAGNESIA) suspension 30 mL  30 mL Oral Daily PRN [name], [name], MD      . temazepam (RESTORIL)  capsule 15 mg  15 mg Oral QHS PRN [name], Madie Reno, MD   15 mg at 07/20/20 2051   PTA Medications: Medications Prior to Admission  Medication Sig Dispense Refill Last Dose  . amLODipine (NORVASC) 5 MG tablet Take 1 tablet (5 mg total) by mouth daily. 30 tablet 1   . clozapine (CLOZARIL) 200 MG tablet Take 1 tablet (200 mg total) by mouth at bedtime. 30 tablet 1   . clozapine (CLOZARIL) 50 MG tablet Take 1 tablet (50 mg total) by mouth daily. 30 tablet 1   . divalproex (DEPAKOTE) 500 MG DR tablet Take 1 tablet (500 mg total) by mouth every 12 (twelve) hours. 60 tablet 1   . paliperidone (INVEGA SUSTENNA) 234 MG/1.5ML SUSY injection Inject 234 mg into the muscle every 28 (twenty-eight) days. 1.5 mL 3   . temazepam (RESTORIL) 15 MG capsule Take 1 capsule (15 mg total) by mouth at bedtime as needed for sleep. 30 capsule 1     Patient Stressors: Medication change or noncompliance  Patient Strengths: Supportive family/friends Other: Has ACTT  Treatment Modalities: Medication Management, Group therapy,  Case management,  1 to 1 session with clinician, Psychoeducation, Recreational therapy.   Physician Treatment Plan for Primary Diagnosis: <principal problem not specified> Long Term Goal(s):     Short Term Goals:    Medication Management: Evaluate patient's response, side effects, and tolerance of medication regimen.  Therapeutic Interventions: 1 to 1 sessions, Unit Group sessions and Medication administration.  Evaluation of Outcomes: Not Met  Physician Treatment Plan for Secondary Diagnosis: Active Problems:   Schizoaffective disorder, bipolar type ([place])  Long Term Goal(s):     Short Term Goals:       Medication Management: Evaluate patient's response, side effects, and tolerance of medication regimen.  Therapeutic Interventions: 1 to 1 sessions, Unit Group sessions and Medication administration.  Evaluation of Outcomes: Not Met   RN Treatment Plan for Primary Diagnosis:  <principal problem not specified> Long Term Goal(s): Knowledge of disease and therapeutic regimen to maintain health will improve  Short Term Goals: Ability to remain free from injury will improve, Ability to demonstrate self-control, Ability to participate in decision making will improve, Ability to verbalize feelings will improve, Ability to disclose and discuss suicidal ideas and Ability to identify and develop effective coping behaviors will improve  Medication Management: RN will administer medications as ordered by provider, will assess and evaluate patient's response and provide education to patient for prescribed medication. RN will report any adverse and/or side effects to prescribing provider.  Therapeutic Interventions: 1 on 1 counseling sessions, Psychoeducation, Medication administration, Evaluate responses to treatment, Monitor vital signs and CBGs as ordered, Perform/monitor CIWA, COWS, AIMS and Fall Risk screenings as ordered, Perform wound care treatments as ordered.  Evaluation of Outcomes: Not Met   LCSW Treatment Plan for Primary Diagnosis: <principal problem not specified> Long Term Goal(s): Safe transition to appropriate next level of care at discharge, Engage patient in therapeutic group addressing interpersonal concerns.  Short Term Goals: Engage patient in aftercare planning with referrals and resources, Increase social support, Increase emotional regulation, Facilitate acceptance of mental health diagnosis and concerns, Identify triggers associated with mental health/substance abuse issues and Increase skills for wellness and recovery  Therapeutic Interventions: Assess for all discharge needs, 1 to 1 time with Social worker, Explore available resources and support systems, Assess for adequacy in community support network, Educate family and significant other(s) on suicide prevention, Complete Psychosocial Assessment, Interpersonal group therapy.  Evaluation of Outcomes:  Not Met   Progress in Treatment: Attending groups: No. Participating in groups: No. Taking medication as prescribed: Yes. Toleration medication: Yes. Family/Significant other contact made: No, will contact:  If consents are given  Patient understands diagnosis: No. Discussing patient identified problems/goals with staff: Yes. Medical problems stabilized or resolved: Yes. Denies suicidal/homicidal ideation: Yes. Issues/concerns per patient self-inventory: No.   New problem(s) identified: No, Describe:  None   New Short Term/Long Term Goal(s): medication stabilization, elimination of SI thoughts, development of comprehensive mental wellness plan.   Patient Goals:  "To go home within an appropriate time frame"  Discharge Plan or Barriers: Patient recently admitted. CSW will continue to follow and assess for appropriate referrals and possible discharge planning.   Reason for Continuation of Hospitalization: Delusions  Medication stabilization  Estimated Length of Stay: 3 to 5 days   Attendees: Patient: [name] [name] 07/21/2020   Physician: Myles Lipps, MD 07/21/2020   Nursing:  07/21/2020   RN Care Manager: 07/21/2020   Social Worker: Verdis Frederickson, [place] 07/21/2020   Recreational Therapist:  07/21/2020   Other:  07/21/2020   Other:  07/21/2020   Other: 07/21/2020     Scribe for Treatment Team: Darleen Crocker, [place] 07/21/2020 11:54 AM

## 2020-07-21 NOTE — [facility] Group Notes (Signed)
Adult Psychoeducational Group Note  Date:  07/21/2020 Time:  10:45 AM  Group Topic/Focus:  Goals Group:   The focus of this group is to help patients establish daily goals to achieve during treatment and discuss how the patient can incorporate goal setting into their daily lives to aide in recovery.  Participation Level:  Active  Participation Quality:  Appropriate  Affect:  Anxious and Flat  Cognitive:  Appropriate  Insight: Appropriate  Engagement in Group:  Engaged  Modes of Intervention:  Discussion  Additional Comments:  Pt has a goal of eventually returning to college. He states that he would like some skills for when  He becomes overwhelmed.    Deforest Hoyles Chizara Mena 07/21/2020, 10:45 AM
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
## 2020-07-22 MED ORDER — ZIPRASIDONE MESYLATE 20 MG IM SOLR: 20.0000 mg | Freq: Four times a day (QID) | INTRAMUSCULAR | Status: DC | PRN

## 2020-07-22 MED ORDER — AMLODIPINE BESYLATE 10 MG PO TABS
10.0000 mg | ORAL_TABLET | Freq: Every day | ORAL | Status: DC
  Administered 2020-07-23 – 2020-07-25 (×3): 10 mg via ORAL
  Filled 2020-07-22 (×4): qty 1
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
## 2020-07-22 NOTE — [facility] Group Notes (Signed)
.  Psychoeducational Group Note  Date: 07-22-20 Time: 0900-1000    Goal Setting   Purpose of Group: This group helps to provide patients with the steps of setting a goal that is specific, measurable, attainable, realistic and time specific. A discussion on how we keep ourselves stuck with negative self talk.    Participation Level:  Did not attend   [name] [name] A  

## 2020-07-22 NOTE — [facility] Group Notes (Signed)
  [facility] LCSW Group Therapy Note  Date/Time:  07/22/2020 11:30AM-12:00PM  Type of Therapy and Topic:  Group Therapy:  Self-Care Wheel  Participation Level:  Minimal   Description of Group This process group involved patients discussing the importance of self-care in different areas of life (professional, personal, emotional, psychological, spiritual, and physical) in order to achieve healthy life balance.  The group talked about what self-care in each of those areas would constitute and then specifically listed how they want to provide themselves with improved self-care in this new year.      Therapeutic Goals 1. Patient will learn how to break self-care down into various areas of life 2. Patient will participate in generating ideas about healthy self-care options in each category 3. Patients will be supportive of one another and receive support from others 4. Patient will identify one healthy self-care activity to add to his/her life this year  Summary of Patient Progress:  The patient was late to group, but expressed that one goal he/she has this year is to find house, while another is to find a job that pays well.  He was not able to identify ways in which self-care in different areas would help to reach that goal and ended up leaving group.  Patient's participation in group was minimal.     Therapeutic Modalities Processing Psychoeducation   Ambrose Mantle, LCSW 07/22/2020 3:37 PM
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
## 2020-07-22 NOTE — Plan of Care (Signed)
Nurse discussed anxiety, depression and coping skills with patient.  

## 2020-07-22 NOTE — Progress Notes (Signed)
D:  Patient's self inventory sheet, patient sleeps good, no sleep medication.  Good appetite, normal energy level, good concentration.  Rated depression and hopeless #2.  Denied anxiety.  Denied physical problems.  Denied physical pain.  Goal is to take meds.  Does have discharge plans. A:  Medications administered per MD orders.  Emotional support and encouragement given patient. R:  Denied SI and HI, contracts for safety.  Safety maintained with 15 minute checks.
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
## 2020-07-22 NOTE — Progress Notes (Signed)
Pt visible on the unit, pt very paranoid and labile, but pleasant with Clinical research associate.     07/22/20 2100  Psych Admission Type (Psych Patients Only)  Admission Status Involuntary  Psychosocial Assessment  Patient Complaints Anxiety  Eye Contact Brief;Staring  Facial Expression Flat  Affect Flat  Speech Unremarkable  Interaction Cautious;Guarded;Minimal  Motor Activity Fidgety;Pacing  Appearance/Hygiene Unremarkable  Behavior Characteristics Anxious  Mood Preoccupied;Labile;Anxious  Thought Process  Coherency WDL  Content Other (Comment)  Delusions None reported or observed  Perception WDL  Hallucination None reported or observed  Judgment Limited  Confusion None  Danger to Self  Current suicidal ideation? Denies  Danger to Others  Danger to Others None reported or observed

## 2020-07-22 NOTE — Progress Notes (Signed)
Pt very anxious and irritable this this shift pacing in and out of his room before going to be Prn Restoril 15mg  given for sleep and vistaril 50mg  given for anxiety.  Patient woke up twice and trying to go in a peer room he required a lot of redirections. He is very intrusive. Support and encouragement provided as needed. Pt is in bed sleeping respirations noted.

## 2020-07-22 NOTE — Progress Notes (Signed)
1610  Patient given IM medications benadryl 50 mg IM haldol 10 mg IM, and Ativan 2 mg IM per MD instructions.   Patient allowed staff to administer medications without staff touching or holding him.  Patient has been walking in the hallway, sitting in dayroom talking to peers.  Patient asked nurse if he can be discharged today.  Patient now orders Unit Restricted.

## 2020-07-23 LAB — CBC WITH DIFFERENTIAL/PLATELET
Abs Immature Granulocytes: 0.12 10*3/uL — ABNORMAL HIGH (ref 0.00–0.07)
Basophils Absolute: 0 10*3/uL (ref 0.0–0.1)
Basophils Relative: 0 %
Eosinophils Absolute: 0 10*3/uL (ref 0.0–0.5)
Eosinophils Relative: 0 %
HCT: 45.5 % (ref 39.0–52.0)
Hemoglobin: 15.9 g/dL (ref 13.0–17.0)
Immature Granulocytes: 2 %
Lymphocytes Relative: 37 %
Lymphs Abs: 2.9 10*3/uL (ref 0.7–4.0)
MCH: 28.2 pg (ref 26.0–34.0)
MCHC: 34.9 g/dL (ref 30.0–36.0)
MCV: 80.7 fL (ref 80.0–100.0)
Monocytes Absolute: 0.8 10*3/uL (ref 0.1–1.0)
Monocytes Relative: 11 %
Neutro Abs: 3.9 10*3/uL (ref 1.7–7.7)
Neutrophils Relative %: 50 %
Platelets: 258 10*3/uL (ref 150–400)
RBC: 5.64 MIL/uL (ref 4.22–5.81)
RDW: 12.3 % (ref 11.5–15.5)
WBC: 7.7 10*3/uL (ref 4.0–10.5)
nRBC: 0 % (ref 0.0–0.2)

## 2020-07-23 LAB — VALPROIC ACID LEVEL: Valproic Acid Lvl: 78 ug/mL (ref 50.0–100.0)

## 2020-07-23 MED ORDER — HALOPERIDOL 5 MG PO TABS
5.0000 mg | ORAL_TABLET | ORAL | Status: AC
  Administered 2020-07-23: 5 mg via ORAL
  Filled 2020-07-23 (×2): qty 1

## 2020-07-23 MED ORDER — PALIPERIDONE PALMITATE ER 234 MG/1.5ML IM SUSY
234.0000 mg | PREFILLED_SYRINGE | Freq: Once | INTRAMUSCULAR | Status: DC
  Filled 2020-07-23: qty 1.5

## 2020-07-23 NOTE — Progress Notes (Signed)
   07/23/20 2130  COVID-19 Daily Checkoff  Have you had a fever (temp > 37.80C/100F)  in the past 24 hours?  No  If you have had runny nose, nasal congestion, sneezing in the past 24 hours, has it worsened? No  COVID-19 EXPOSURE  Have you traveled outside the state in the past 14 days? No  Have you been in contact with someone with a confirmed diagnosis of COVID-19 or PUI in the past 14 days without wearing appropriate PPE? No  Have you been living in the same home as a person with confirmed diagnosis of COVID-19 or a PUI (household contact)? No  Have you been diagnosed with COVID-19? No

## 2020-07-23 NOTE — Progress Notes (Signed)
Pt given PRN Vistaril with HS medications to help with his anxiety on the unit this evening, pt given PPRN Restoril per [facility] to help with sleep

## 2020-07-23 NOTE — B[facility] Counselor (Signed)
Clinical Social Work Note  CSW contacted patient's [employer] on their crisis number to ask if patient received his most recent Invega long-acting injectable on 07/11/2020.  They contacted their doctor who reported that the patient has refused his LAI each time he is discharged from the hospital, so has not received it recently.  This information was shared with doctor.  Team was also updated on the aggressive behavior of patient on the unit.  Ambrose Mantle, LCSW 07/23/2020, 1:53 PM
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
## 2020-07-23 NOTE — Plan of Care (Signed)
Nurse discussed anxiety and coping skills with patient. 
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
## 2020-07-23 NOTE — Progress Notes (Signed)
D:  Patient's self inventory sheet, patient sleeps good, no sleep medication.  Good appetite, normal energy level, good concentration.  Denied depression, hopeless and anxiety.  Denied withdrawals.  Denied SI.  Denied physical problems.  Denied physical pain.  Goal is get out of here.  Plans to take meds.  Discharge when?  Do discharge plans. A:  Medications administered per MD orders.  Emotional support and encouragement given patient. R:  Denied SI and HI, contracts for safety.  Denied A/V hallucinations.  Safety maintained with 15 minute checks.

## 2020-07-24 ENCOUNTER — Other Ambulatory Visit (HOSPITAL_COMMUNITY): Payer: Self-pay | Admitting: Psychiatry

## 2020-07-24 MED ORDER — CLOZAPINE 25 MG PO TABS
250.0000 mg | ORAL_TABLET | Freq: Every day | ORAL | Status: DC
  Administered 2020-07-24: 250 mg via ORAL
  Filled 2020-07-24 (×2): qty 2

## 2020-07-24 MED ORDER — PALIPERIDONE PALMITATE ER 234 MG/1.5ML IM SUSY
234.0000 mg | PREFILLED_SYRINGE | Freq: Once | INTRAMUSCULAR | Status: AC
  Administered 2020-07-24: 234 mg via INTRAMUSCULAR
  Filled 2020-07-24: qty 1.5

## 2020-07-24 MED ORDER — PALIPERIDONE PALMITATE ER 234 MG/1.5ML IM SUSY: 234.0000 mg | PREFILLED_SYRINGE | Freq: Once | INTRAMUSCULAR | Status: DC

## 2020-07-24 MED FILL — INVEGA SUSTENNA 234 MG PREF: 234 | 30 days supply | Qty: 2 | Fill #0
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
## 2020-07-24 NOTE — Progress Notes (Signed)
Recreation Therapy Notes  Date: 1.3.22 Time: 1000 Location: [address] Dayroom  Group Topic: Coping Skills  Goal Area(s) Addresses:  Patient will be able to identify positive coping skills. Patient will be able to identify benefits of using coping skills.  Intervention: Herbalist, pencils  Activity: Orthoptist.  Patients were to identify the things they are dealing with and write it inside of the web.  Patients would then write their coping skills on the outside of the web they can use to help deal with the things they are dealing with.  Education: Pharmacologist, Building control surveyor.   Education Outcome: Acknowledges understanding/In group clarification offered/Needs additional education.   Clinical Observations/Feedback: Pt did not attend group session.    Caroll Rancher, LRT/CTRS         Caroll Rancher A 07/24/2020 11:50 AM
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
## 2020-07-24 NOTE — Progress Notes (Signed)
Patient has been up pacing the hallway some tonight but not in an aggressive manner. He sat in the dayroom briefly after reqesting the channel be changed. He was compliant with his medications and went to his room to rest after spending a little more time pacing the hallway. He did have to be redirected from the doors leading off the hallway a few times. Safety maintained with 15 min checks.

## 2020-07-24 NOTE — [facility] Group Notes (Signed)
[facility] LCSW Group Therapy  07/24/2020 1:09 PM  Type of Therapy:  Group Therapy  Participation Level:  Active  Summary of Progress/Problems: CSW gave pts a handout asking them to identify stressors that are present in their life. After identifying these stressors, on the next page, pts were asked to identify emotions that occur when they are stressed and ways they could cope with their stressors.   [name] [name] 07/24/2020, 1:09 PM

## 2020-07-24 NOTE — Progress Notes (Signed)
Adult Psychoeducational Group Note  Date:  07/24/2020 Time:  4:10 AM  Group Topic/Focus:  Wrap-Up Group:   The focus of this group is to help patients review their daily goal of treatment and discuss progress on daily workbooks.  Participation Level:  Minimal  Participation Quality:  Appropriate  Affect:  Anxious  Cognitive:  Disorganized and Confused  Insight: Limited  Engagement in Group:  Limited  Modes of Intervention:  Discussion  Additional Comments:  Pt stated his goal for today was to focus on his treatment plan. Pt stated he accomplished his goal today. Pt stated he was able to talk with his doctor and social worker about his care today. Pt rated his overall day an 5 out of 10. Pt stated he felt better about himself today.  Pt stated he was able to contact some family members today but could not remember who he contacted.Pt stated his relationship with his family and support system needs to be improved.Pt stated he was brought back all his meals today because he is still on unit restriction. Pt stated he took all medications provided today. Pt stated his appetite was pretty good today. Pt rated sleep last night was pretty good. Pt stated he was no physical pain today. Pt deny auditory or visual hallucinations. Pt denies thoughts of harming himself or others. Pt stated he would alert staff if anything changes.  Felipa Furnace 07/24/2020, 4:10 AM

## 2020-07-24 NOTE — Progress Notes (Signed)
Pt appropriate on the unit, pt stated he was ready to D/C tomorrow     07/24/20 2100  Psych Admission Type (Psych Patients Only)  Admission Status Involuntary  Psychosocial Assessment  Patient Complaints Anxiety  Eye Contact Brief;Staring  Facial Expression Flat  Affect Flat  Speech Unremarkable  Interaction Cautious;Guarded;Minimal  Motor Activity Fidgety;Pacing  Appearance/Hygiene Unremarkable  Behavior Characteristics Cooperative  Mood Anxious  Thought Process  Coherency WDL  Content Other (Comment)  Delusions None reported or observed  Perception WDL  Hallucination None reported or observed  Judgment Limited  Confusion None  Danger to Self  Current suicidal ideation? Denies  Danger to Others  Danger to Others None reported or observed

## 2020-07-24 NOTE — Progress Notes (Signed)
   07/24/20 1500  Psych Admission Type (Psych Patients Only)  Admission Status Involuntary  Psychosocial Assessment  Patient Complaints Anxiety  Eye Contact Brief;Staring  Facial Expression Flat  Affect Flat  Speech Unremarkable  Interaction Cautious;Guarded;Minimal  Motor Activity Fidgety;Pacing  Appearance/Hygiene Unremarkable  Behavior Characteristics Cooperative  Mood Anxious  Thought Process  Coherency WDL  Content Other (Comment)  Delusions None reported or observed  Perception WDL  Hallucination None reported or observed  Judgment Limited  Confusion None  Danger to Self  Current suicidal ideation? Denies  Danger to Others  Danger to Others None reported or observed  Dar Note: Patient presents with anxious affect and mood.  Denies suicidal thoughts, auditory and visual hallucinations.  Medication given as prescribed.  Routine safety checks maintained.  Invega Sustenna injectable given.  No adverse reaction noted.  Patient requested and received Ativan 2 mg for anxiety with good effect.  Patient is safe on the unit.

## 2020-07-24 NOTE — [facility] Suicide Risk Assessment (Signed)
[facility] INPATIENT:  Family/Significant Other Suicide Prevention Education  Suicide Prevention Education:  Contact Attempts: [name] [phone], (name of family member/significant other) has been identified by the patient as the family member/significant other with whom the patient will be residing, and identified as the person(s) who will aid the patient in the event of a mental health crisis.  With written consent from the patient, two attempts were made to provide suicide prevention education, prior to and/or following the patient's discharge.   Date and time of first attempt:07/24/20 at 9:51am Date and time of second attempt: CSW will make another attempt at a later time.   [name] [name] 07/24/2020, 9:52 AM

## 2020-07-24 NOTE — Progress Notes (Signed)
Recreation Therapy Notes  INPATIENT RECREATION THERAPY ASSESSMENT  Patient Details Name: [name] [name] MRN: [id number] DOB: 26-Feb-1996 Today's Date: 07/24/2020       Information Obtained From: Patient  Able to Participate in Assessment/Interview: Yes  Patient Presentation: Alert  Reason for Admission (Per Patient): Med Non-Compliance  Patient Stressors:  (None identified)  Coping Skills:   TV,Music,Art,Talk,Avoidance,Hot Bath/Shower  Leisure Interests (2+):  Individual - Other (Comment) (Drive)  Frequency of Recreation/Participation: Other (Comment) (Daily)  Awareness of Community Resources:  Yes  Community Resources:  Park,Restaurants  Current Use: Yes  If no, Barriers?:    Expressed Interest in [employer] Information: No  [place] Residence:  [place]  Patient Main Form of Transportation: Set designer  Patient Strengths:  Dependable; [employer]; Wicked  Patient Identified Areas of Improvement:  None  Patient Goal for Hospitalization:  "I don't know"  Current SI (including self-harm):  No  Current HI:  No  Current AVH: No  Staff Intervention Plan: Group Attendance,Collaborate with Interdisciplinary Treatment Team  Consent to Intern Participation: N/A    Caroll Rancher, LRT/CTRS  Caroll Rancher A 07/24/2020, 12:46 PM

## 2020-07-24 NOTE — Progress Notes (Signed)
Adult Psychoeducational Group Note  Date:  07/24/2020 Time:  10:19 PM  Group Topic/Focus:  Wrap-Up Group:   The focus of this group is to help patients review their daily goal of treatment and discuss progress on daily workbooks.  Participation Level:  Minimal  Participation Quality:  Appropriate  Affect:  Anxious and Flat  Cognitive:  Appropriate  Insight: Limited  Engagement in Group:  Limited  Modes of Intervention:  Discussion  Additional Comments:  Pt stated his goal for today was to focus on his treatment plan and to talk with his doctor about his discharge plan. Pt stated he accomplished his goals today. Pt stated the plan is for him to discharge on tomorrow (Tuesday). Pt stated he was able to talk with his doctor and social worker about his care today. Pt rated his overall day a 10. Pt stated he felt better about himself today. Pt stated his relationship with his family and support system needs to be improved. Pt stated he was brought back all his meals today because he is still on unit restriction. Pt stated he took all medications provided today. Pt stated his appetite was pretty good today. Pt rated sleep last night was pretty good. Pt stated he was no physical pain today. Pt deny auditory or visual hallucinations. Pt denies thoughts of harming himself or others. Pt stated he would alert staff if anything changes.  Felipa Furnace 07/24/2020, 10:19 PM

## 2020-07-25 DIAGNOSIS — F250 Schizoaffective disorder, bipolar type: Principal | ICD-10-CM

## 2020-07-25 MED ORDER — CLOZAPINE 50 MG PO TABS: 250.0000 mg | ORAL_TABLET | Freq: Every day | ORAL | 0 refills | Status: DC

## 2020-07-25 MED ORDER — CLOZAPINE 50 MG PO TABS: 50.0000 mg | ORAL_TABLET | Freq: Every day | ORAL | 0 refills | Status: DC

## 2020-07-25 MED ORDER — TEMAZEPAM 15 MG PO CAPS: 15.0000 mg | ORAL_CAPSULE | Freq: Every evening | ORAL | 0 refills | Status: DC | PRN

## 2020-07-25 MED ORDER — DIVALPROEX SODIUM 500 MG PO DR TAB: 500.0000 mg | DELAYED_RELEASE_TABLET | Freq: Two times a day (BID) | ORAL | 0 refills | Status: DC

## 2020-07-25 MED ORDER — AMLODIPINE BESYLATE 10 MG PO TABS: 10.0000 mg | ORAL_TABLET | Freq: Every day | ORAL | 0 refills | Status: DC

## 2020-07-25 NOTE — Progress Notes (Signed)
  [facility] Adult Case Management Discharge Plan :  Will you be returning to the same living situation after discharge:  Yes,  home. At discharge, do you have transportation home?: Yes,  via safe transport. Do you have the ability to pay for your medications: Yes,  Pt is insured.  Release of information consent forms completed and in the chart;  Patient's signature needed at discharge.  Patient to Follow up at:  Follow-up Information    [address] [name] [place] [employer]. Follow up.   Why: Please continue services with the ACTT team. Please continue taking medications as prescribed.  Contact information: [address] [place] [address] [place] [address] [phone]               Next level of care provider has access to [facility] Link:no  Safety Planning and Suicide Prevention discussed: Yes,  completed with Pt's mother.  Have you used any form of tobacco in the last 30 days? (Cigarettes, Smokeless Tobacco, Cigars, and/or Pipes): Yes  Has patient been referred to the Quitline?: N/A patient is not a smoker  Patient has been referred for addiction treatment: N/A  Jacinta Shoe, LCSW 07/25/2020, 11:02 AM

## 2020-07-25 NOTE — Progress Notes (Signed)
Recreation Therapy Notes  Date: 1.4.22 Time: 1000 Location: [address] Dayroom  Group Topic: Anxiety  Goal Area(s) Addresses:  Patient will identify triggers for anxiety.  Patient will identify a situation that makes them anxious.  Patient will identify what other emotions come with anxiety.   Intervention: Conversation with group, and worksheets  Activity: Patient discussed anxiety.  Patients discussed what makes them anxious, physical symptoms of anxiety, the thoughts they have when anxious and coping skills they use when anxious.  Education: Anxiety Management, Discharge Planning   Education Outcome: Acknowledges education/In group clarification offered/Needs additional education.   Clinical Observations/Feedback: Pt did not attend group session.     Hadiyah Maricle, LRT/CTRS         Adewale Pucillo A 07/25/2020 11:22 AM 

## 2020-07-25 NOTE — Progress Notes (Signed)
Recreation Therapy Notes  INPATIENT RECREATION TR PLAN  Patient Details Name: [name] [name] MRN: [id number] DOB: 18-Nov-1995 Today's Date: 07/25/2020  Rec Therapy Plan Is patient appropriate for Therapeutic Recreation?: Yes Treatment times per week: about 3 days Estimated Length of Stay: 5-7 days TR Treatment/Interventions: Group participation (Comment)  Discharge Criteria Pt will be discharged from therapy if:: Discharged Treatment plan/goals/alternatives discussed and agreed upon by:: Patient/family  Discharge Summary Short term goals set: See patient care plan Short term goals met: Not met Reason goals not met: Pt did not attend group sessions. Therapeutic equipment acquired: N/A Reason patient discharged from therapy: Discharge from hospital Pt/family agrees with progress & goals achieved: Yes Date patient discharged from therapy: 07/25/20   Victorino Sparrow, LRT/CTRS  Ria Comment, Aladdin Kollmann A 07/25/2020, 11:45 AM
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
## 2020-07-25 NOTE — [facility] Counselor (Signed)
CSW contacted [facility] [employer] ACTT to inform them that [name] [name] would be discharging today.  CSW left a voicemail asking for a return call from the ACTT team and informed them of the discharge today.  CSW will continue to monitor.

## 2020-07-25 NOTE — Progress Notes (Signed)
Pt given PRN Vistaril / Restoril HS to help sleep and help with pt anxiety

## 2020-07-25 NOTE — Progress Notes (Signed)
RN met with pt and reviewed pt's discharge instructions.  Pt verbalized understanding of discharge instructions and pt did not have any questions. RN reviewed and provided pt with a copy of SRA, AVS and Transition Record.  RN returned pt's belongings to pt.  Pt denied SI/HI/AVH and voiced no concerns.  Pt was appreciative of the care pt received at [facility].  Patient discharged to the lobby without incident. 

## 2020-07-25 NOTE — Plan of Care (Signed)
Pt did not attend group recreation therapy group sessions.   Caroll Rancher, LRT/CTRS
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
## 2021-09-06 IMAGING — US US PELVIS LIMITED
1 series · 10 of 10 positions shown · non-contrast
Comparison: None.

CLINICAL DATA: History of perineal abscess with possible
recurrence.

EXAM:
US PELVIS LIMITED
TECHNIQUE: Ultrasound examination of the pelvic soft tissues was performed in
the area of clinical concern.

[Series 1: us pelvis limited · 10 acquisitions, 10 frames shown]
[im 1/10]
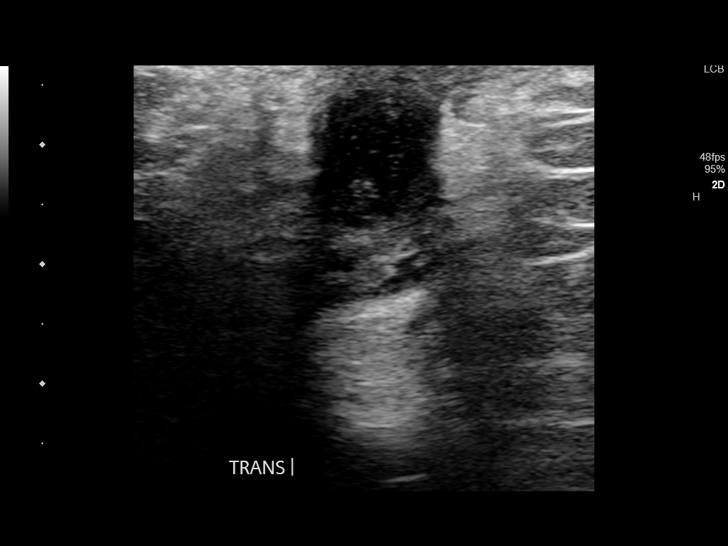
[im 2/10]
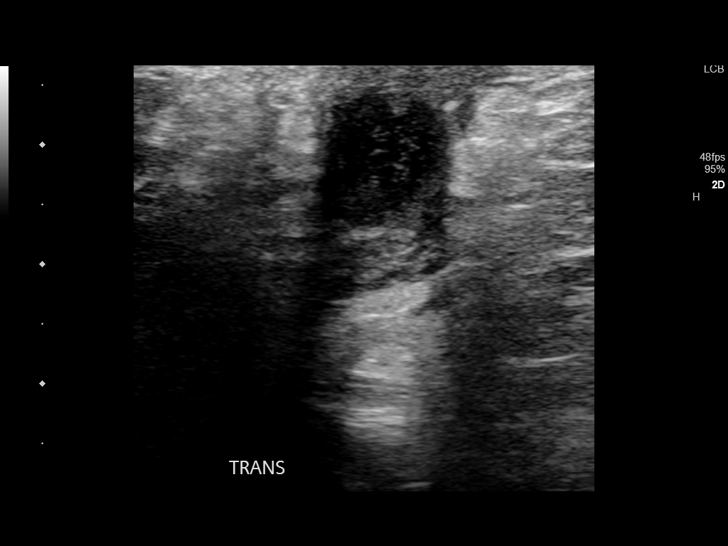
[im 3/10]
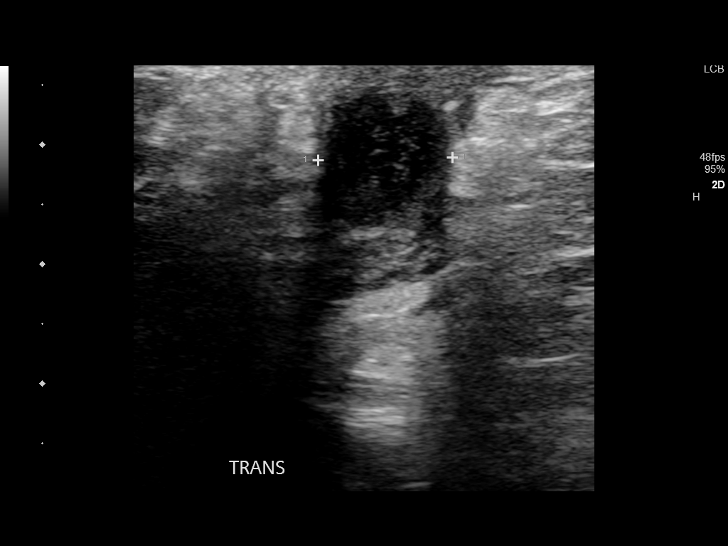
[im 4/10]
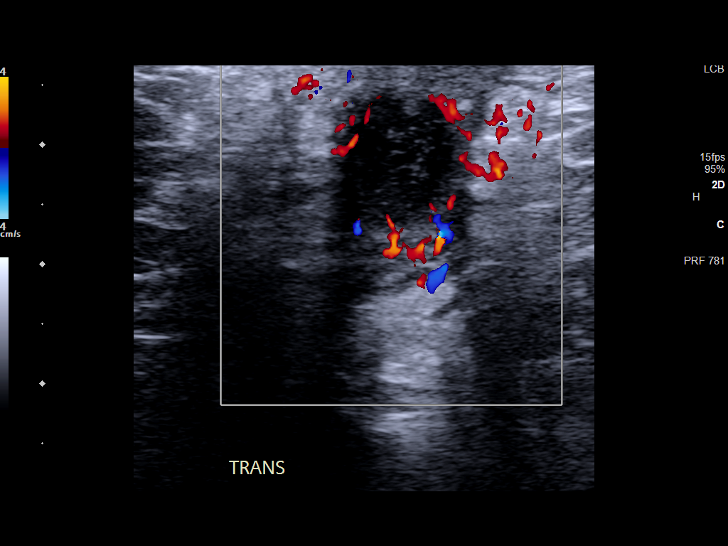
[im 5/10]
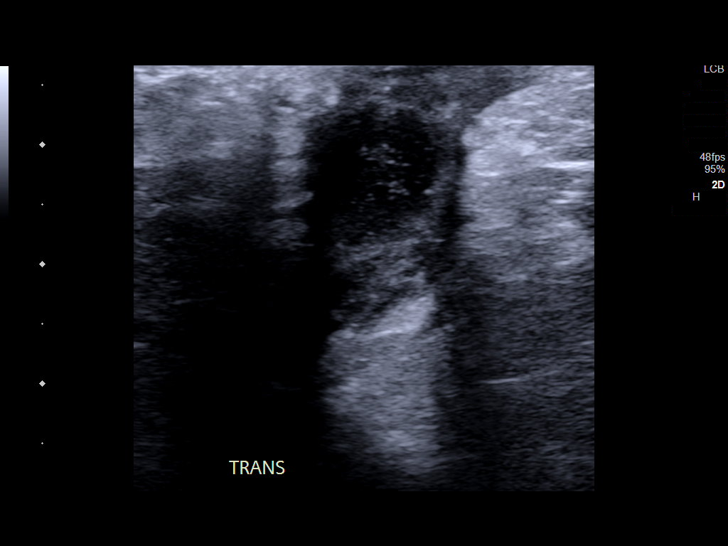
[im 6/10]
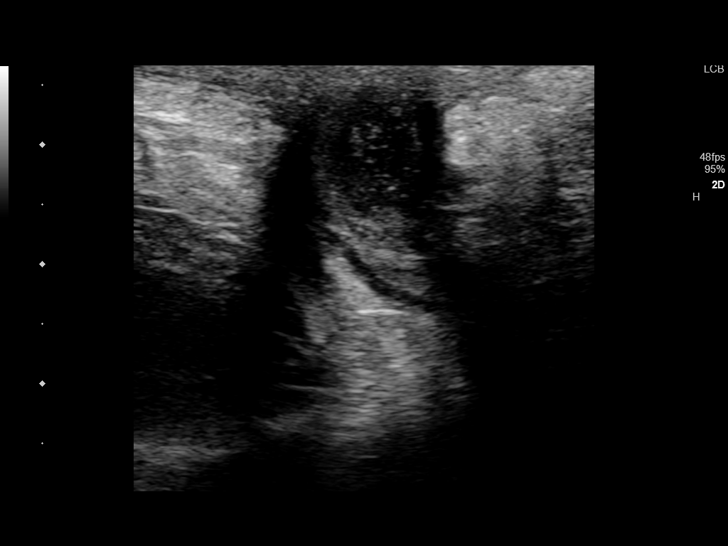
[im 7/10]
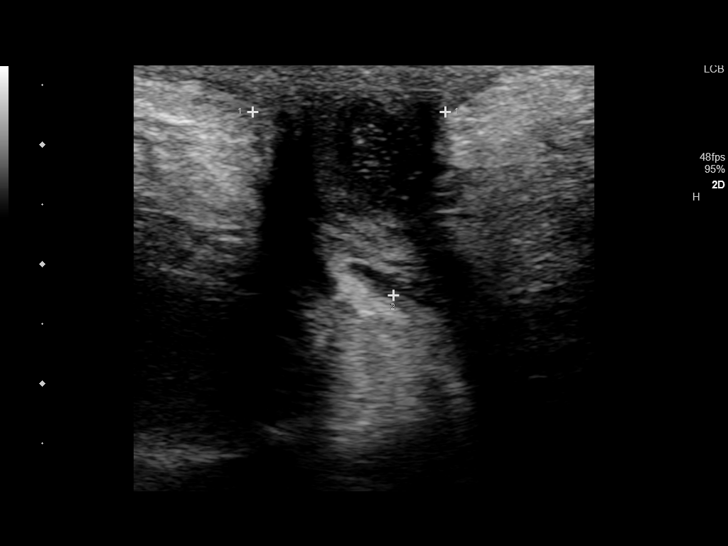
[im 8/10]
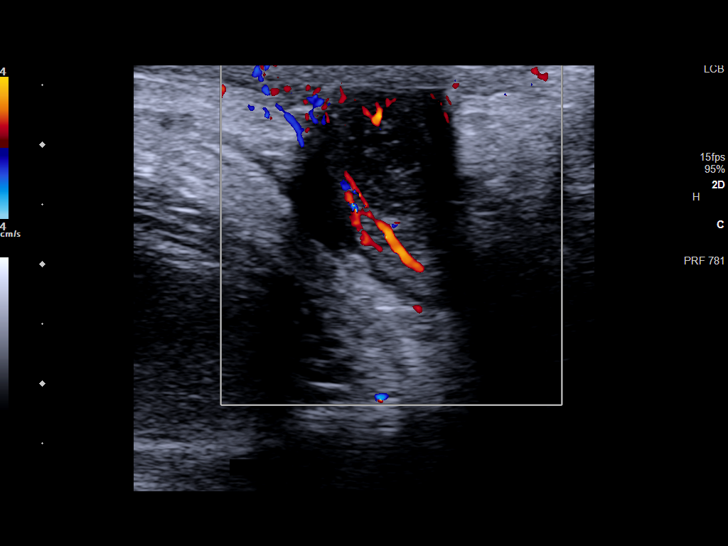
[im 9/10]
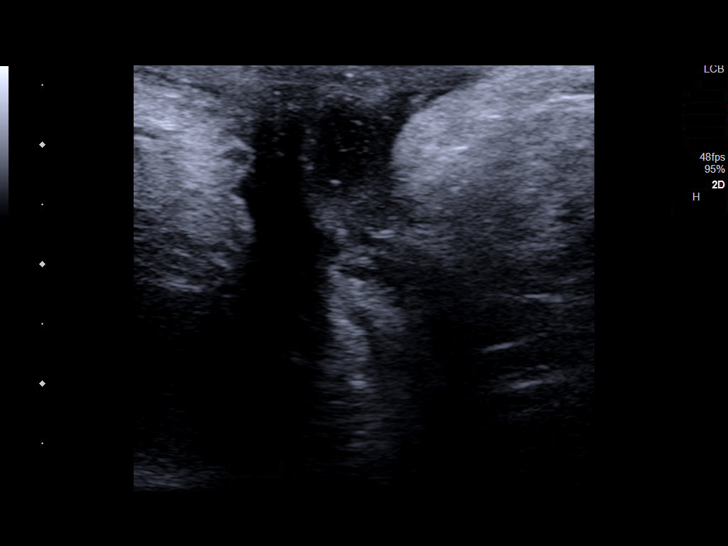
[im 10/10]
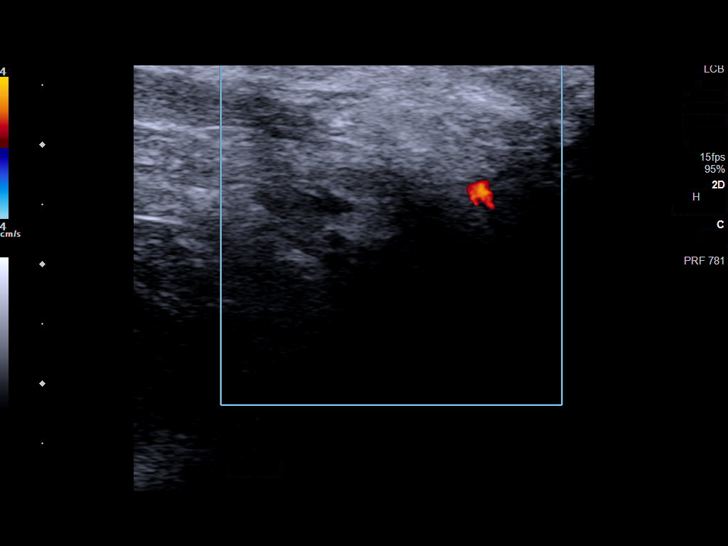

[10 of 10 positions shown; findings below may reference images not displayed]

FINDINGS: Focused ultrasound exam of the perineum was performed. Patient
localized the area of concern. 1.6 x 2.0 x 1.1 cm complex fluid
collection with enhanced through transmission identified just deep
to the skin. There may be some overlying skin thickening and
localized subcutaneous edema is evident.
IMPRESSION: Focal skin thickening with underlying subcutaneous edema and a 2 cm
complex fluid collection. Abscess is a distinct consideration.

## 2021-12-03 ENCOUNTER — Emergency Department (EMERGENCY_DEPARTMENT_HOSPITAL)
Admission: EM | Admit: 2021-12-03 | Discharge: 2021-12-04 | Disposition: A | Discharge status: Other Acute Inpatient Hospital | Payer: No Typology Code available for payment source | Source: Home / Self Care | Attending: Emergency Medicine | Admitting: Emergency Medicine

## 2021-12-03 DIAGNOSIS — Z20822 Contact with and (suspected) exposure to covid-19: Secondary | ICD-10-CM | POA: Insufficient documentation

## 2021-12-03 DIAGNOSIS — I10 Essential (primary) hypertension: Secondary | ICD-10-CM | POA: Insufficient documentation

## 2021-12-03 DIAGNOSIS — J45909 Unspecified asthma, uncomplicated: Secondary | ICD-10-CM | POA: Insufficient documentation

## 2021-12-03 DIAGNOSIS — R4689 Other symptoms and signs involving appearance and behavior: Secondary | ICD-10-CM | POA: Insufficient documentation

## 2021-12-03 DIAGNOSIS — F250 Schizoaffective disorder, bipolar type: Secondary | ICD-10-CM | POA: Diagnosis present

## 2021-12-03 LAB — CBC
HCT: 43 % (ref 39.0–52.0)
Hemoglobin: 14.7 g/dL (ref 13.0–17.0)
MCH: 27.8 pg (ref 26.0–34.0)
MCHC: 34.2 g/dL (ref 30.0–36.0)
MCV: 81.3 fL (ref 80.0–100.0)
Platelets: 267 10*3/uL (ref 150–400)
RBC: 5.29 MIL/uL (ref 4.22–5.81)
RDW: 12.4 % (ref 11.5–15.5)
WBC: 5.2 10*3/uL (ref 4.0–10.5)
nRBC: 0 % (ref 0.0–0.2)

## 2021-12-03 LAB — COMPREHENSIVE METABOLIC PANEL
ALT: 27 U/L (ref 0–44)
AST: 19 U/L (ref 15–41)
Albumin: 4.4 g/dL (ref 3.5–5.0)
Alkaline Phosphatase: 72 U/L (ref 38–126)
Anion gap: 10 (ref 5–15)
BUN: 15 mg/dL (ref 6–20)
CO2: 23 mmol/L (ref 22–32)
Calcium: 9.6 mg/dL (ref 8.9–10.3)
Chloride: 104 mmol/L (ref 98–111)
Creatinine, Ser: 0.91 mg/dL (ref 0.61–1.24)
GFR, Estimated: >60 mL/min (ref >60)
Glucose, Bld: 88 mg/dL (ref 70–99)
Potassium: 4.2 mmol/L (ref 3.5–5.1)
Sodium: 137 mmol/L (ref 135–145)
Total Bilirubin: 1.1 mg/dL (ref 0.3–1.2)
Total Protein: 7.4 g/dL (ref 6.5–8.1)

## 2021-12-03 LAB — ETHANOL: Alcohol, Ethyl (B): <10 mg/dL (ref <10)

## 2021-12-03 LAB — RESP PANEL BY RT-PCR (FLU A&B, COVID) ARPGX2
Influenza A by PCR: NEGATIVE
Influenza B by PCR: NEGATIVE
SARS Coronavirus 2 by RT PCR: NEGATIVE

## 2021-12-03 LAB — SALICYLATE LEVEL: Salicylate Lvl: <7 mg/dL — ABNORMAL LOW (ref 7.0–30.0)

## 2021-12-03 LAB — ACETAMINOPHEN LEVEL: Acetaminophen (Tylenol), Serum: <10 ug/mL — ABNORMAL LOW (ref 10–30)
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
## 2021-12-03 NOTE — BH Assessment (Signed)
Comprehensive Clinical Assessment (CCA) Note ? ?12/03/2021 ?[name] GinsMohmed A Rothe ?[id number] ?Recommendations for Services/Supports/Treatments: Consulted with Madaline BrilliantJackie T., NP, who determined pt. meets inpatient psychiatric criteria. Notified Dr. Katrinka BlazingSmith and Amy, RN of disposition recommendation.  ?Rollin A. Hulan AmatoMustafa is a 26 year old., Black, Non-Hispanic, ENGLISH speaking male with PMH of schizoaffective disorder, bipolar type. Per triage note: Pt presents IVC in police custody for increased anxiety and aggression with his family. He has been non-complaint with his Depakote for the last 3 days.  Of note, was visibly trembling with a rigid posture throughout the assessment. When asked what'd brought him to the hospital the pt. stated, ?Everything's fine. It's just anxiety. Pt denied medication noncompliance and/or being verbally/physically aggressive towards his family. Pt is currently refusing to get his blood drawn or take medications due to paranoia while in the ED. On assessment, pt. is cooperative; however, his anxiety seems to interfere with his concentration. Pt's thoughts appear to be blocked, and pt has poverty of speech. Pt had poor reality testing and judgement. Pt was oriented x4. Pt's mood was anxious and affect was blunted. Pt denied current SI/HI/AV/H. There is no acute risk for suicide or violence at this time. ?Collateral: [name] Nurse [phone]) Nurse reported that the pt did in fact stop taking his medications 3 days ago. Nurse reported that the pt barricaded himself in the house, refusing to let his mother leave due to worsening paranoia. The nurse explained that these actions lead to her initiating the IVC process. Nurse requested pt be started on an injectable due to pt's noncompliance.  ?Chief Complaint:  ?Chief Complaint  ?Patient presents with  ? Psychiatric Evaluation  ? ?Visit Diagnosis: Schizoaffective disorder, bipolar type  ? ? ?CCA Screening, Triage and Referral (STR) ? ?Patient  Reported Information ?How did you hear about us? Family/Friend ? ?Referral name: No data recorded ?Referral phone number: No data recorded ? ?Whom do you see for routine medical problems? No data recorded ?Practice/Facility Name: No data recorded ?Practice/Facility Phone Number: No data recorded ?Name of Contact: No data recorded ?Contact Number: No data recorded ?Contact Fax Number: No data recorded ?Prescriber Name: No data recorded ?Prescriber Address (if known): No data recorded ? ?What Is the Reason for Your Visit/Call Today? Pt presents IVC in police custody for increased anxiety and aggression with his family. He has been non-complaint with his Depakote for the last 3 days. ? ?How Long Has This Been Causing You Problems? 1-6 months ? ?What Do You Feel Would Help You the Most Today? Medication(s) ? ? ?Have You Recently Been in Any Inpatient Treatment (Hospital/Detox/Crisis Center/28-Day Program)? No data recorded ?Name/Location of Program/Hospital:No data recorded ?How Long Were You There? No data recorded ?When Were You Discharged? No data recorded ? ?Have You Ever Received Services From [facility] Before? No data recorded ?Who Do You [facility] at [facility]? No data recorded ? ?Have You Recently Had Any Thoughts About Hurting Yourself? No ? ?Are You Planning to Commit Suicide/Harm Yourself At This time? No ? ? ?Have you Recently Had Thoughts About Hurting Someone [name] [name]? No ? ?Explanation: No data recorded ? ?Have You Used Any Alcohol or Drugs in the Past 24 Hours? No ? ?How Long Ago Did You Use Drugs or Alcohol? No data recorded ?What Did You Use and How Much? No data recorded ? ?Do You Currently Have a Therapist/Psychiatrist? Yes ? ?Name of Therapist/Psychiatrist: [name] ? ? ?Have You Been Recently Discharged From Any Office Practice or Programs?  No ? ?Explanation of Discharge From Practice/Program: No data recorded ? ?  ?CCA Screening Triage Referral Assessment ?Type of Contact:  Face-to-Face ? ?Is this Initial or Reassessment? No data recorded ?Date Telepsych consult ordered in [facility]:  No data recorded ?Time Telepsych consult ordered in [facility]:  No data recorded ? ?Patient Reported Information Reviewed? No data recorded ?Patient Left Without Being Seen? No data recorded ?Reason for Not Completing Assessment: No data recorded ? ?Collateral Involvement: [name] ACT team Nurse ? ? ?Does Patient Have a Automotive engineer Guardian? No data recorded ?Name and Contact of Legal Guardian: No data recorded ?If Minor and Not Living with Parent(s), Who has Custody? n/a ? ?Is CPS involved or ever been involved? Never ? ?Is APS involved or ever been involved? Never ? ? ?Patient Determined To Be At Risk for Harm To Self or Others Based on Review of Patient Reported Information or Presenting Complaint? No ? ?Method: No data recorded ?Availability of Means: No data recorded ?Intent: No data recorded ?Notification Required: No data recorded ?Additional Information for Danger to Others Potential: No data recorded ?Additional Comments for Danger to Others Potential: No data recorded ?Are There Guns or Other Weapons in Your Home? No data recorded ?Types of Guns/Weapons: No data recorded ?Are These Weapons Safely Secured?                            No data recorded ?Who Could Verify You Are Able To Have These Secured: No data recorded ?Do You Have any Outstanding Charges, Pending Court Dates, Parole/Probation? No data recorded ?Contacted To Inform of Risk of Harm To Self or Others: Other: Comment ? ? ?Location of Assessment: [facility] ED ? ? ?Does Patient Present under Involuntary Commitment? Yes ? ?IVC Papers Initial File Date: 12/03/21 ? ? ?[place] of Residence: [place] ? ? ?Patient Currently Receiving the Following Services: ACTT Engineer, agricultural Treatment); Medication Management ? ? ?Determination of Need: Emergent (2 hours) ? ? ?Options For Referral: Inpatient Hospitalization ? ? ? ? ?CCA  Biopsychosocial ?Intake/Chief Complaint:  No data recorded ?Current Symptoms/Problems: No data recorded ? ?Patient Reported Schizophrenia/Schizoaffective Diagnosis in Past: Yes ? ? ?Strengths: UTA ? ?Preferences: No data recorded ?Abilities: No data recorded ? ?Type of Services Patient Feels are Needed: No data recorded ? ?Initial Clinical Notes/Concerns: No data recorded ? ?Mental Health Symptoms ?Depression:  No data recorded  ?Duration of Depressive symptoms: No data recorded  ?Mania:   ?None ?  ?Anxiety:    ?Tension; Worrying; Difficulty concentrating ?  ?Psychosis:   ?Delusions; Hallucinations ?  ?Duration of Psychotic symptoms:  ?Greater than six months ?  ?Trauma:   ?N/A ?  ?Obsessions:   ?None ?  ?Compulsions:   ?None ?  ?Inattention:   ?N/A ?  ?Hyperactivity/Impulsivity:   ?N/A ?  ?Oppositional/Defiant Behaviors:   ?Defies rules ?  ?Emotional Irregularity:   ?None ?  ?Other Mood/Personality Symptoms:  No data recorded  ? ?Mental Status Exam ?Appearance and self-care  ?Stature:   ?Tall ?  ?Weight:   ?Overweight ?  ?Clothing:   ?-- (Scrubs) ?  ?Grooming:   ?Normal ?  ?Cosmetic use:   ?None ?  ?Posture/gait:   ?Rigid ?  ?Motor activity:   ?Tremor ?  ?Sensorium  ?Attention:   ?Normal ?  ?Concentration:   ?Anxiety interferes ?  ?Orientation:   ?Object; Person; Place ?  ?Recall/memory:   ?Normal ?  ?Affect and Mood  ?Affect:   ?  Blunted ?  ?Mood:   ?Anxious ?  ?Relating  ?Eye contact:   ?Staring ?  ?Facial expression:   ?Anxious ?  ?Attitude toward examiner:   ?Cooperative; Guarded ?  ?Thought and Language  ?Speech flow:  ?Clear and Coherent ?  ?Thought content:   ?Appropriate to Mood and Circumstances ?  ?Preoccupation:   ?None ?  ?Hallucinations:   ?Auditory ?  ?Organization:  No data recorded  ?Executive Functions  ?Fund of Knowledge:   ?Average ?  ?Intelligence:   ?Average ?  ?Abstraction:   ?Overly abstract ?  ?Judgement:   ?Poor ?  ?Reality Testing:   ?Unaware ?  ?Insight:   ?Poor ?  ?Decision Making:    ?Paralyzed; Only simple ?  ?Social Functioning  ?Social Maturity:  No data recorded  ?Social Judgement:   ?Impropriety ?  ?Stress  ?Stressors:   ?Other (Comment) (Abnormal mentation) ?  ?Coping Ability:   ?Exhausted ?  ?Skill Deficits:

## 2021-12-03 NOTE — ED Notes (Signed)
Pt declined labs until he is seen in the back by a provider because his "anxiety is acting up." ?

## 2021-12-03 NOTE — ED Notes (Addendum)
Pt dressed out with this RN and EDT Georgiann Hahn) belongings include:  ? ?1 blue hat ?1 pair of black shoes ?1 pair of black pants ?1 green sweater ? ?1 pack of meds that he takes at home  ?

## 2021-12-03 NOTE — Consult Note (Signed)
[facility] Face-to-Face Psychiatry Consult   Reason for Consult: Psychiatric Evaluation   Referring Physician: Dr. Sidney Ace Patient Identification: [name] [name] MRN:  [id number] Principal Diagnosis: <principal problem not specified> Diagnosis:  Active Problems:   Schizoaffective disorder, bipolar type (HCC)   Hypertension   Total Time spent with patient: 1 hour  Subjective:   [name] [name] is a 26 y.o. male patient presented to [facility] ED via law enforcement under involuntary commitment status (IVC). The patient with a PMH of schizoaffective disorder, bipolar type. It was reported that the patient was in police custody due to increased anxiety and aggression towards his family. He has been non-compliant with his Depakote for the last three days. Pt cooperative in triage and denies SI/HI. During the patient assessment, he visibly trembled with a rigid posture throughout the evaluation. When asked what brought him to the hospital, the patient said, "Everything's fine. It's just anxiety.  This provider saw The patient face-to-face; the chart was reviewed, and consulted with Dr. Katrinka Blazing on 12/03/2021 due to the patient's care. It was discussed with the EDP that the patient does meet the criteria to be admitted to the psychiatric inpatient unit.  On evaluation, the patient is alert and oriented x 4, anxious, resistant to care and initially uncooperative, and mood-congruent with affect.  The patient does not appear to be responding to internal or external stimuli. The patient is presenting with some delusional thinking. The patient denies auditory or visual hallucinations. The patient denies any suicidal, homicidal, or self-harm ideations. The patient is presenting with some psychotic and paranoid behaviors. During an encounter with the patient, he could answer questions appropriately. Per Ms. Faulcon, Collateral: [name] ACT Team Nurse [phone]) Nurse reported that the pt did in fact stop taking his  medications 3 days ago. Nurse reported that the pt barricaded himself in the house, refusing to let his mother leave due to worsening paranoia. The nurse explained that these actions lead to her initiating the IVC process. Nurse requested pt be started on an injectable due to pt's noncompliance.   HPI: Per Dr. Sidney Ace, [name] [name] is a 26 y.o. male with past medical history of asthma, depression, psychosis schizoaffective disorder presents under IVC.  Per IVC paperwork patient has been noncompliant with his Depakote and has been acting aggressive.  Patient tells me that he is here because his anxiety has been acting up.  Feeling quite anxious denies suicidal ideation.  Initially says he is not taking his medications for his mental health problems but then says he is denies any medical complaints he is not suicidal.  Past Psychiatric History:  Depression Psychosis (HCC) Schizoaffective disorder (HCC)    Risk to Self:   Risk to Others:   Prior Inpatient Therapy:   Prior Outpatient Therapy:    Past Medical History:  Past Medical History:  Diagnosis Date   Asthma    Depression    Psychosis (HCC)    Schizoaffective disorder (HCC)     Past Surgical History:  Procedure Laterality Date   BACK SURGERY     I&D groin  2017   Family History: History reviewed. No pertinent family history. Family Psychiatric  History:  Social History:  Social History   Substance and Sexual Activity  Alcohol Use No     Social History   Substance and Sexual Activity  Drug Use Never    Social History   Socioeconomic History   Marital status: Single    Spouse name: Not on file  Number of children: Not on file   Years of education: Not on file   Highest education level: Not on file  Occupational History   Not on file  Tobacco Use   Smoking status: Never   Smokeless tobacco: Never  Vaping Use   Vaping Use: Some days  Substance and Sexual Activity   Alcohol use: No   Drug use: Never    Sexual activity: Not Currently  Other Topics Concern   Not on file  Social History Narrative   Not on file   Social Determinants of Health   Financial Resource Strain: Not on file  Food Insecurity: Not on file  Transportation Needs: Not on file  Physical Activity: Not on file  Stress: Not on file  Social Connections: Not on file   Additional Social History:    Allergies:  No Known Allergies  Labs:  Results for orders placed or performed during the hospital encounter of 12/03/21 (from the past 48 hour(s))  Resp Panel by RT-PCR (Flu A&B, Covid) Nasopharyngeal Swab     Status: None   Collection Time: 12/03/21  8:37 PM   Specimen: Nasopharyngeal Swab; Nasopharyngeal(NP) swabs in vial transport medium  Result Value Ref Range   SARS Coronavirus 2 by RT PCR NEGATIVE NEGATIVE    Comment: (NOTE) SARS-CoV-2 target nucleic acids are NOT DETECTED.  The SARS-CoV-2 RNA is generally detectable in upper respiratory specimens during the acute phase of infection. The lowest concentration of SARS-CoV-2 viral copies this assay can detect is 138 copies/mL. A negative result does not preclude SARS-Cov-2 infection and should not be used as the sole basis for treatment or other patient management decisions. A negative result may occur with  improper specimen collection/handling, submission of specimen other than nasopharyngeal swab, presence of viral mutation(s) within the areas targeted by this assay, and inadequate number of viral copies(<138 copies/mL). A negative result must be combined with clinical observations, patient history, and epidemiological information. The expected result is Negative.  Fact Sheet for Patients:  BloggerCourse.com  Fact Sheet for Healthcare Providers:  SeriousBroker.it  This test is no t yet approved or cleared by the [place] FDA and  has been authorized for detection and/or diagnosis of SARS-CoV-2 by FDA  under an Emergency Use Authorization (EUA). This EUA will remain  in effect (meaning this test can be used) for the duration of the COVID-19 declaration under Section 564(b)(1) of the Act, 21 U.S.C.section 360bbb-3(b)(1), unless the authorization is terminated  or revoked sooner.       Influenza A by PCR NEGATIVE NEGATIVE   Influenza B by PCR NEGATIVE NEGATIVE    Comment: (NOTE) The Xpert Xpress SARS-CoV-2/FLU/RSV plus assay is intended as an aid in the diagnosis of influenza from Nasopharyngeal swab specimens and should not be used as a sole basis for treatment. Nasal washings and aspirates are unacceptable for Xpert Xpress SARS-CoV-2/FLU/RSV testing.  Fact Sheet for Patients: BloggerCourse.com  Fact Sheet for Healthcare Providers: SeriousBroker.it  This test is not yet approved or cleared by the [place] FDA and has been authorized for detection and/or diagnosis of SARS-CoV-2 by FDA under an Emergency Use Authorization (EUA). This EUA will remain in effect (meaning this test can be used) for the duration of the COVID-19 declaration under Section 564(b)(1) of the Act, 21 U.S.C. section 360bbb-3(b)(1), unless the authorization is terminated or revoked.  Performed at [facility], [address], [place], [place] [address]   Comprehensive metabolic panel     Status: None  Collection Time: 12/03/21  9:28 PM  Result Value Ref Range   Sodium 137 135 - 145 mmol/L   Potassium 4.2 3.5 - 5.1 mmol/L   Chloride 104 98 - 111 mmol/L   CO2 23 22 - 32 mmol/L   Glucose, Bld 88 70 - 99 mg/dL    Comment: Glucose reference range applies only to samples taken after fasting for at least 8 hours.   BUN 15 6 - 20 mg/dL   Creatinine, Ser 3.29 0.61 - 1.24 mg/dL   Calcium 9.6 8.9 - 51.8 mg/dL   Total Protein 7.4 6.5 - 8.1 g/dL   Albumin 4.4 3.5 - 5.0 g/dL   AST 19 15 - 41 U/L   ALT 27 0 - 44 U/L   Alkaline Phosphatase 72 38 -  126 U/L   Total Bilirubin 1.1 0.3 - 1.2 mg/dL   GFR, Estimated >84 >16 mL/min    Comment: (NOTE) Calculated using the CKD-EPI Creatinine Equation (2021)    Anion gap 10 5 - 15    Comment: Performed at [facility], [address] [place], [place] [address]  Ethanol     Status: None   Collection Time: 12/03/21  9:28 PM  Result Value Ref Range   Alcohol, Ethyl (B) <10 <10 mg/dL    Comment: (NOTE) Lowest detectable limit for serum alcohol is 10 mg/dL.  For medical purposes only. Performed at [facility], [address] [place], [place] [address]   Salicylate level     Status: Abnormal   Collection Time: 12/03/21  9:28 PM  Result Value Ref Range   Salicylate Lvl <7.0 (L) 7.0 - 30.0 mg/dL    Comment: Performed at [facility], [address] [place], [place] [address]  Acetaminophen level     Status: Abnormal   Collection Time: 12/03/21  9:28 PM  Result Value Ref Range   Acetaminophen (Tylenol), Serum <10 (L) 10 - 30 ug/mL    Comment: (NOTE) Therapeutic concentrations vary significantly. A range of 10-30 ug/mL  may be an effective concentration for many patients. However, some  are best treated at concentrations outside of this range. Acetaminophen concentrations >150 ug/mL at 4 hours after ingestion  and >50 ug/mL at 12 hours after ingestion are often associated with  toxic reactions.  Performed at [facility], [address] [place], [place] [address]   cbc     Status: None   Collection Time: 12/03/21  9:28 PM  Result Value Ref Range   WBC 5.2 4.0 - 10.5 K/uL   RBC 5.29 4.22 - 5.81 MIL/uL   Hemoglobin 14.7 13.0 - 17.0 g/dL   HCT 02.5 42.7 - 06.2 %   MCV 81.3 80.0 - 100.0 fL   MCH 27.8 26.0 - 34.0 pg   MCHC 34.2 30.0 - 36.0 g/dL   RDW 37.6 28.3 - 15.1 %   Platelets 267 150 - 400 K/uL   nRBC 0.0 0.0 - 0.2 %    Comment: Performed at [facility], [address] [place], [place] [address]    No current  facility-administered medications for this encounter.   Current Outpatient Medications  Medication Sig Dispense Refill   amLODipine (NORVASC) 10 MG tablet Take 1 tablet (10 mg total) by mouth daily. 30 tablet 0   cloZAPine (CLOZARIL) 50 MG tablet Take 1 tablet (50 mg total) by mouth daily. 30 tablet 0   cloZAPine (CLOZARIL) 50 MG tablet Take 5 tablets (250 mg total) by mouth at  bedtime. 30 tablet 0   divalproex (DEPAKOTE) 500 MG DR tablet Take 1 tablet (500 mg total) by mouth every 12 (twelve) hours. 60 tablet 0   glycopyrrolate (ROBINUL) 2 MG tablet Take by mouth.     paliperidone (INVEGA SUSTENNA) 234 MG/1.5ML SUSY injection Inject 234 mg into the muscle every 28 (twenty-eight) days. 1.5 mL 3   paliperidone (INVEGA SUSTENNA) 234 MG/1.5ML SUSY injection INJECT AT FACILITY 1.5 mL 0   temazepam (RESTORIL) 15 MG capsule Take 1 capsule (15 mg total) by mouth at bedtime as needed for sleep. 30 capsule 0    Musculoskeletal: Strength & Muscle Tone: within normal limits Gait & Station: normal Patient leans: N/A  Psychiatric Specialty Exam:  Presentation  General Appearance: Casual; Well Groomed  Eye Contact:Good  Speech:Slow  Speech Volume:Decreased  Handedness:Right   Mood and Affect  Mood:Anxious  Affect:Inappropriate; Constricted   Thought Process  Thought Processes:Disorganized  Descriptions of Associations:Circumstantial  Orientation:Full (Time, Place and Person)  Thought Content:Paranoid Ideation; Scattered; Illogical  History of Schizophrenia/Schizoaffective disorder:Yes  Duration of Psychotic Symptoms:Greater than six months  Hallucinations:Hallucinations: None  Ideas of Reference:Paranoia; Delusions  Suicidal Thoughts:Suicidal Thoughts: No  Homicidal Thoughts:Homicidal Thoughts: No   Sensorium  Memory:Immediate Poor; Recent Poor; Remote Poor  Judgment:Poor  Insight:Poor   Executive Functions  Concentration:Fair  Attention  Span:Poor  Recall:Poor  Fund of Knowledge:Poor  Language:Poor   Psychomotor Activity  Psychomotor Activity:Psychomotor Activity: Normal   Assets  Assets:Communication Skills; Desire for Improvement; Resilience; Social Support   Sleep  Sleep:Sleep: Good Number of Hours of Sleep: 8   Physical Exam: Physical Exam Vitals and nursing note reviewed.  Constitutional:      Appearance: Normal appearance. He is normal weight.  HENT:     Head: Normocephalic and atraumatic.     Right Ear: External ear normal.     Left Ear: External ear normal.     Nose: Nose normal.  Cardiovascular:     Rate and Rhythm: Normal rate.     Pulses: Normal pulses.  Pulmonary:     Effort: Pulmonary effort is normal.  Musculoskeletal:        General: Normal range of motion.     Cervical back: Normal range of motion and neck supple.  Neurological:     General: No focal deficit present.     Mental Status: He is alert and oriented to person, place, and time.  Psychiatric:        Attention and Perception: Attention and perception normal.        Mood and Affect: Mood is anxious. Affect is blunt and inappropriate.        Speech: Speech normal.        Behavior: Behavior is withdrawn. Behavior is cooperative.        Thought Content: Thought content is paranoid and delusional.        Cognition and Memory: Cognition is impaired.        Judgment: Judgment is inappropriate.   Review of Systems  Psychiatric/Behavioral:  The patient is nervous/anxious.   All other systems reviewed and are negative. Blood pressure 110/70, pulse 63, temperature 98.7 F (37.1 C), temperature source Oral, resp. rate 20, height 6\' 1"  (1.854 m), weight 108.9 kg, SpO2 98 %. Body mass index is 31.66 kg/m.  Treatment Plan Summary: Medication management and Plan    Disposition: Recommend psychiatric Inpatient admission when medically cleared. Supportive therapy provided about ongoing stressors.  Gillermo MurdochJacqueline Cheskel Silverio,  NP 12/03/2021 11:23 PM

## 2021-12-03 NOTE — BH Assessment (Signed)
Referral information for Psychiatric Hospitalization faxed to:  ? ?[facility] [phone]- [phone]) ? ?[facility] [phone]),  ? ?[facility] [phone]) Per charge nurse Kara Dies, [facility] unable to take pt's at this time.  ? [facility] [phone]), ? ?[facility] [phone]),  ? ?[facility] [phone]),  ? ?[facility] [phone]). ? ?[facility] [phone]) ? ?

## 2021-12-04 ENCOUNTER — Inpatient Hospital Stay
Admission: AD | Admit: 2021-12-04 | Discharge: 2021-12-19 | DRG: 885 | Disposition: A | Discharge status: Home / Self Care | Payer: No Typology Code available for payment source | Source: Intra-hospital | Attending: Psychiatry | Admitting: Psychiatry

## 2021-12-04 ENCOUNTER — Encounter: Payer: Self-pay | Admitting: Psychiatry

## 2021-12-04 ENCOUNTER — Other Ambulatory Visit: Payer: Self-pay

## 2021-12-04 DIAGNOSIS — Z91148 Patient's other noncompliance with medication regimen for other reason: Secondary | ICD-10-CM | POA: Diagnosis not present

## 2021-12-04 DIAGNOSIS — G4700 Insomnia, unspecified: Secondary | ICD-10-CM | POA: Diagnosis present

## 2021-12-04 DIAGNOSIS — Z79899 Other long term (current) drug therapy: Secondary | ICD-10-CM | POA: Diagnosis not present

## 2021-12-04 DIAGNOSIS — F203 Undifferentiated schizophrenia: Secondary | ICD-10-CM

## 2021-12-04 DIAGNOSIS — Z20822 Contact with and (suspected) exposure to covid-19: Secondary | ICD-10-CM | POA: Diagnosis present

## 2021-12-04 DIAGNOSIS — I10 Essential (primary) hypertension: Secondary | ICD-10-CM | POA: Diagnosis present

## 2021-12-04 DIAGNOSIS — F209 Schizophrenia, unspecified: Principal | ICD-10-CM | POA: Diagnosis present

## 2021-12-04 DIAGNOSIS — F201 Disorganized schizophrenia: Secondary | ICD-10-CM | POA: Diagnosis not present

## 2021-12-04 DIAGNOSIS — F250 Schizoaffective disorder, bipolar type: Secondary | ICD-10-CM | POA: Diagnosis not present

## 2021-12-04 LAB — HEMOGLOBIN A1C
Hgb A1c MFr Bld: 5.1 % (ref 4.8–5.6)
Mean Plasma Glucose: 99.67 mg/dL

## 2021-12-04 LAB — URINE DRUG SCREEN, QUALITATIVE (ARMC ONLY)
Amphetamines, Ur Screen: NOT DETECTED
Barbiturates, Ur Screen: NOT DETECTED
Benzodiazepine, Ur Scrn: NOT DETECTED
Cannabinoid 50 Ng, Ur ~~LOC~~: POSITIVE — AB
Cocaine Metabolite,Ur ~~LOC~~: NOT DETECTED
MDMA (Ecstasy)Ur Screen: NOT DETECTED
Methadone Scn, Ur: NOT DETECTED
Opiate, Ur Screen: NOT DETECTED
Phencyclidine (PCP) Ur S: NOT DETECTED
Tricyclic, Ur Screen: NOT DETECTED

## 2021-12-04 MED ORDER — ALUM & MAG HYDROXIDE-SIMETH 200-200-20 MG/5ML PO SUSP
30.0000 mL | ORAL | Status: DC | PRN
  Administered 2021-12-17 – 2021-12-18 (×4): 30 mL via ORAL
  Filled 2021-12-04 (×4): qty 30

## 2021-12-04 MED ORDER — DIVALPROEX SODIUM 500 MG PO DR TAB
500.0000 mg | DELAYED_RELEASE_TABLET | Freq: Two times a day (BID) | ORAL | Status: DC
  Administered 2021-12-05 – 2021-12-19 (×28): 500 mg via ORAL
  Filled 2021-12-04 (×30): qty 1

## 2021-12-04 MED ORDER — CLOZAPINE 25 MG PO TABS
50.0000 mg | ORAL_TABLET | Freq: Every day | ORAL | Status: DC
Start: 2021-12-04 — End: 2021-12-07
  Administered 2021-12-05 – 2021-12-06 (×2): 50 mg via ORAL
  Filled 2021-12-04 (×3): qty 2

## 2021-12-04 MED ORDER — ACETAMINOPHEN 325 MG PO TABS: 650.0000 mg | ORAL_TABLET | Freq: Four times a day (QID) | ORAL | Status: DC | PRN

## 2021-12-04 MED ORDER — PALIPERIDONE PALMITATE ER 234 MG/1.5ML IM SUSY
234.0000 mg | PREFILLED_SYRINGE | Freq: Once | INTRAMUSCULAR | Status: AC
  Administered 2021-12-04: 234 mg via INTRAMUSCULAR
  Filled 2021-12-04 (×2): qty 1.5

## 2021-12-04 MED ORDER — MAGNESIUM HYDROXIDE 400 MG/5ML PO SUSP: 30.0000 mL | Freq: Every day | ORAL | Status: DC | PRN

## 2021-12-04 NOTE — ED Notes (Signed)
Pt resting comfortably with no acute distress. ?

## 2021-12-04 NOTE — BH Assessment (Signed)
Patient is to be admitted to [facility] today 12/04/21 by Dr. Toni Amend.  ?Attending Physician will be Dr.  Toni Amend .   ?Patient has been assigned to room 319, by [facility] Charge Nurse, Ivonne Andrew.   ? ?ER staff is aware of the admission: ?Lynnette, ER Secretary   ?Dr. Darnelle Catalan, ER MD  ?Connye Burkitt, Patient's Nurse  ?Sue Lush, Patient Access.  ?

## 2021-12-04 NOTE — Plan of Care (Signed)
New admission. ? ?Problem: Education: ?Goal: Knowledge of [facility] Education information/materials will improve ?Outcome: Not Progressing ?Goal: Emotional status will improve ?Outcome: Not Progressing ?Goal: Mental status will improve ?Outcome: Not Progressing ?Goal: Verbalization of understanding the information provided will improve ?Outcome: Not Progressing ?  ?Problem: Health Behavior/Discharge Planning: ?Goal: Compliance with treatment plan for underlying cause of condition will improve ?Outcome: Not Progressing ?  ?Problem: Safety: ?Goal: Periods of time without injury will increase ?Outcome: Not Progressing ?  ?Problem: Self-Concept: ?Goal: Ability to identify factors that promote anxiety will improve ?Outcome: Not Progressing ?Goal: Level of anxiety will decrease ?Outcome: Not Progressing ?  ?Problem: Education: ?Goal: Will be free of psychotic symptoms ?Outcome: Not Progressing ?  ?Problem: Health Behavior/Discharge Planning: ?Goal: Compliance with prescribed medication regimen will improve ?Outcome: Not Progressing ?  ?

## 2021-12-04 NOTE — Progress Notes (Signed)
Patient has been pacing the hallways for the past couple of hours. Patient continues to be pleasant, and remains safe on the unit. ?

## 2021-12-04 NOTE — ED Notes (Signed)
Confirmed with [facility] psychiatrist that pt was previously taking Invega injection, but had stopped that per his own request in January. Also states that he has stopped all of his antipsychotics for the last 2 months per his wishes and is only compliant with Depakote.  ?

## 2021-12-04 NOTE — H&P (Signed)
Psychiatric Admission Assessment Adult  Patient Identification: [name] [name] MRN:  [id number] Date of Evaluation:  12/04/2021 Chief Complaint:  Schizophrenia (HCC) [F20.9] Principal Diagnosis: Schizophrenia (HCC) Diagnosis:  Principal Problem:   Schizophrenia (HCC) Active Problems:   Hypertension  History of Present Illness: Patient seen and chart reviewed.  Patient known from previous encounters.  26 year old man with a history of schizophrenia.  He has [employer] as his primary provider.  Brought in under IVC after an episode in which he barricaded himself in his mother in the house frightening his mother making it impossible for her to leave.  Patient has been displaying more paranoia confusion and disorganized behavior for a month or so.  I spoke with a member of the ACT team who said that he has been off of his clozapine for at least a month and probably has not gotten his Invega injection in a couple of months.  Patient admits to occasional cannabis use.  On interview today the patient says that he is frightened and feels very bad because he thinks that everyone hates him.  I ask him if he could name a single person he thought he hated him and he said he could not do that nor could he give any reason at all why he would think that but he says that he thinks everyone thinks that he is a bad person. Associated Signs/Symptoms: Depression Symptoms:  anhedonia, insomnia, feelings of worthlessness/guilt, Duration of Depression Symptoms: No data recorded (Hypo) Manic Symptoms:  Impulsivity, Anxiety Symptoms:  Excessive Worry, Psychotic Symptoms:  Hallucinations: Auditory Paranoia, PTSD Symptoms: Negative Total Time spent with patient: 30 minutes  Past Psychiatric History: Patient has a long history of schizophrenia multiple hospitalizations although we have not seen him in the hospital in over a year at this point.  He has ACT team services.  He has done well in the past when he was on  a combination of clozapine and Depakote especially when long-acting injectables were added on top of that.  When he decompensates he often gets nearly catatonic and very paranoid.  Does have a past history of some self threatening behavior.  Is the patient at risk to self? Yes.    Has the patient been a risk to self in the past 6 months? Yes.    Has the patient been a risk to self within the distant past? Yes.    Is the patient a risk to others? Yes.    Has the patient been a risk to others in the past 6 months? No.  Has the patient been a risk to others within the distant past? No.   Prior Inpatient Therapy:   Prior Outpatient Therapy:    Alcohol Screening: 1. How often do you have a drink containing alcohol?: Never 2. How many drinks containing alcohol do you have on a typical day when you are drinking?: 1 or 2 3. How often do you have six or more drinks on one occasion?: Never AUDIT-C Score: 0 4. How often during the last year have you found that you were not able to stop drinking once you had started?: Never 5. How often during the last year have you failed to do what was normally expected from you because of drinking?: Never 6. How often during the last year have you needed a first drink in the morning to get yourself going after a heavy drinking session?: Never 7. How often during the last year have you had a feeling of guilt of  remorse after drinking?: Never 8. How often during the last year have you been unable to remember what happened the night before because you had been drinking?: Never 9. Have you or someone else been injured as a result of your drinking?: No 10. Has a relative or friend or a doctor or another health worker been concerned about your drinking or suggested you cut down?: No Alcohol Use Disorder Identification Test Final Score (AUDIT): 0 Substance Abuse History in the last 12 months:  Yes.   Consequences of Substance Abuse: Worsening psychotic symptoms Previous  Psychotropic Medications: Yes  Psychological Evaluations: Yes  Past Medical History:  Past Medical History:  Diagnosis Date   Asthma    Depression    Psychosis (HCC)    Schizoaffective disorder (HCC)     Past Surgical History:  Procedure Laterality Date   BACK SURGERY     I&D groin  2017   Family History: History reviewed. No pertinent family history. Family Psychiatric  History: None reported Tobacco Screening:   Social History:  Social History   Substance and Sexual Activity  Alcohol Use No     Social History   Substance and Sexual Activity  Drug Use Never    Additional Social History:                           Allergies:  No Known Allergies Lab Results:  Results for orders placed or performed during the hospital encounter of 12/03/21 (from the past 48 hour(s))  Resp Panel by RT-PCR (Flu A&B, Covid) Nasopharyngeal Swab     Status: None   Collection Time: 12/03/21  8:37 PM   Specimen: Nasopharyngeal Swab; Nasopharyngeal(NP) swabs in vial transport medium  Result Value Ref Range   SARS Coronavirus 2 by RT PCR NEGATIVE NEGATIVE    Comment: (NOTE) SARS-CoV-2 target nucleic acids are NOT DETECTED.  The SARS-CoV-2 RNA is generally detectable in upper respiratory specimens during the acute phase of infection. The lowest concentration of SARS-CoV-2 viral copies this assay can detect is 138 copies/mL. A negative result does not preclude SARS-Cov-2 infection and should not be used as the sole basis for treatment or other patient management decisions. A negative result may occur with  improper specimen collection/handling, submission of specimen other than nasopharyngeal swab, presence of viral mutation(s) within the areas targeted by this assay, and inadequate number of viral copies(<138 copies/mL). A negative result must be combined with clinical observations, patient history, and epidemiological information. The expected result is Negative.  Fact Sheet for  Patients:  BloggerCourse.com  Fact Sheet for Healthcare Providers:  SeriousBroker.it  This test is no t yet approved or cleared by the [place] FDA and  has been authorized for detection and/or diagnosis of SARS-CoV-2 by FDA under an Emergency Use Authorization (EUA). This EUA will remain  in effect (meaning this test can be used) for the duration of the COVID-19 declaration under Section 564(b)(1) of the Act, 21 U.S.C.section 360bbb-3(b)(1), unless the authorization is terminated  or revoked sooner.       Influenza A by PCR NEGATIVE NEGATIVE   Influenza B by PCR NEGATIVE NEGATIVE    Comment: (NOTE) The Xpert Xpress SARS-CoV-2/FLU/RSV plus assay is intended as an aid in the diagnosis of influenza from Nasopharyngeal swab specimens and should not be used as a sole basis for treatment. Nasal washings and aspirates are unacceptable for Xpert Xpress SARS-CoV-2/FLU/RSV testing.  Fact Sheet for Patients: BloggerCourse.com  Fact Sheet for  Healthcare Providers: SeriousBroker.it  This test is not yet approved or cleared by the [place] and has been authorized for detection and/or diagnosis of SARS-CoV-2 by FDA under an Emergency Use Authorization (EUA). This EUA will remain in effect (meaning this test can be used) for the duration of the COVID-19 declaration under Section 564(b)(1) of the Act, 21 U.S.C. section 360bbb-3(b)(1), unless the authorization is terminated or revoked.  Performed at [facility] [address] [place], [place] [address]   Comprehensive metabolic panel     Status: None   Collection Time: 12/03/21  9:28 PM  Result Value Ref Range   Sodium 137 135 - 145 mmol/L   Potassium 4.2 3.5 - 5.1 mmol/L   Chloride 104 98 - 111 mmol/L   CO2 23 22 - 32 mmol/L   Glucose, Bld 88 70 - 99 mg/dL    Comment: Glucose reference range applies only to  samples taken after fasting for at least 8 hours.   BUN 15 6 - 20 mg/dL   Creatinine, Ser 6.04 0.61 - 1.24 mg/dL   Calcium 9.6 8.9 - 54.0 mg/dL   Total Protein 7.4 6.5 - 8.1 g/dL   Albumin 4.4 3.5 - 5.0 g/dL   AST 19 15 - 41 U/L   ALT 27 0 - 44 U/L   Alkaline Phosphatase 72 38 - 126 U/L   Total Bilirubin 1.1 0.3 - 1.2 mg/dL   GFR, Estimated >98 >11 mL/min    Comment: (NOTE) Calculated using the CKD-EPI Creatinine Equation (2021)    Anion gap 10 5 - 15    Comment: Performed at [facility], [address] [place], [place] [address]  Ethanol     Status: None   Collection Time: 12/03/21  9:28 PM  Result Value Ref Range   Alcohol, Ethyl (B) <10 <10 mg/dL    Comment: (NOTE) Lowest detectable limit for serum alcohol is 10 mg/dL.  For medical purposes only. Performed at [facility], [address] [place], [place] [address]   Salicylate level     Status: Abnormal   Collection Time: 12/03/21  9:28 PM  Result Value Ref Range   Salicylate Lvl <7.0 (L) 7.0 - 30.0 mg/dL    Comment: Performed at [facility], [address] [place], [place] [address]  Acetaminophen level     Status: Abnormal   Collection Time: 12/03/21  9:28 PM  Result Value Ref Range   Acetaminophen (Tylenol), Serum <10 (L) 10 - 30 ug/mL    Comment: (NOTE) Therapeutic concentrations vary significantly. A range of 10-30 ug/mL  may be an effective concentration for many patients. However, some  are best treated at concentrations outside of this range. Acetaminophen concentrations >150 ug/mL at 4 hours after ingestion  and >50 ug/mL at 12 hours after ingestion are often associated with  toxic reactions.  Performed at [facility], [address] [place], [place] [address]   cbc     Status: None   Collection Time: 12/03/21  9:28 PM  Result Value Ref Range   WBC 5.2 4.0 - 10.5 K/uL   RBC 5.29 4.22 - 5.81 MIL/uL   Hemoglobin 14.7 13.0 - 17.0 g/dL   HCT 96.2 95.2 - 84.1 %    MCV 81.3 80.0 - 100.0 fL   MCH 27.8 26.0 - 34.0 pg   MCHC 34.2 30.0 - 36.0 g/dL   RDW 32.4 40.1 - 02.7 %   Platelets 267 150 - 400 K/uL  nRBC 0.0 0.0 - 0.2 %    Comment: Performed at [facility], [address] [place], [place] [address]  Urine Drug Screen, Qualitative     Status: Abnormal   Collection Time: 12/04/21  1:31 AM  Result Value Ref Range   Tricyclic, Ur Screen NONE DETECTED NONE DETECTED   Amphetamines, Ur Screen NONE DETECTED NONE DETECTED   MDMA (Ecstasy)Ur Screen NONE DETECTED NONE DETECTED   Cocaine Metabolite,Ur [place] NONE DETECTED NONE DETECTED   Opiate, Ur Screen NONE DETECTED NONE DETECTED   Phencyclidine (PCP) Ur S NONE DETECTED NONE DETECTED   Cannabinoid 50 Ng, Ur [place] POSITIVE (A) NONE DETECTED   Barbiturates, Ur Screen NONE DETECTED NONE DETECTED   Benzodiazepine, Ur Scrn NONE DETECTED NONE DETECTED   Methadone Scn, Ur NONE DETECTED NONE DETECTED    Comment: (NOTE) Tricyclics + metabolites, urine    Cutoff 1000 ng/mL Amphetamines + metabolites, urine  Cutoff 1000 ng/mL MDMA (Ecstasy), urine              Cutoff 500 ng/mL Cocaine Metabolite, urine          Cutoff 300 ng/mL Opiate + metabolites, urine        Cutoff 300 ng/mL Phencyclidine (PCP), urine         Cutoff 25 ng/mL Cannabinoid, urine                 Cutoff 50 ng/mL Barbiturates + metabolites, urine  Cutoff 200 ng/mL Benzodiazepine, urine              Cutoff 200 ng/mL Methadone, urine                   Cutoff 300 ng/mL  The urine drug screen provides only a preliminary, unconfirmed analytical test result and should not be used for non-medical purposes. Clinical consideration and professional judgment should be applied to any positive drug screen result due to possible interfering substances. A more specific alternate chemical method must be used in order to obtain a confirmed analytical result. Gas chromatography / mass spectrometry (GC/MS) is the preferred confirm atory  method. Performed at [facility], [address] [place], [place] [address]     Blood Alcohol level:  Lab Results  Component Value Date   [facility] <10 12/03/2021   ETH <10 07/18/2020    Metabolic Disorder Labs:  Lab Results  Component Value Date   HGBA1C 4.8 07/19/2020   MPG 91.06 07/19/2020   MPG 93.93 06/18/2020   No results found for: PROLACTIN Lab Results  Component Value Date   CHOL 256 (H) 07/19/2020   TRIG 137 07/19/2020   HDL 52 07/19/2020   CHOLHDL 4.9 07/19/2020   VLDL 27 07/19/2020   LDLCALC 177 (H) 07/19/2020   LDLCALC 95 06/18/2020    Current Medications: Current Facility-Administered Medications  Medication Dose Route Frequency Provider Last Rate Last Admin   acetaminophen (TYLENOL) tablet 650 mg  650 mg Oral Q6H PRN Vanetta Mulders, NP       alum & mag hydroxide-simeth (MAALOX/MYLANTA) 200-200-20 MG/5ML suspension 30 mL  30 mL Oral Q4H PRN Gabriel Cirri F, NP       cloZAPine (CLOZARIL) tablet 50 mg  50 mg Oral QHS Bridgette Wolden T, MD       divalproex (DEPAKOTE) DR tablet 500 mg  500 mg Oral Q12H Ellesse Antenucci T, MD       magnesium hydroxide (MILK OF MAGNESIA) suspension 30 mL  30 mL Oral Daily PRN  Vanetta Mulders, NP       PTA Medications: Medications Prior to Admission  Medication Sig Dispense Refill Last Dose   amLODipine (NORVASC) 10 MG tablet Take 1 tablet (10 mg total) by mouth daily. 30 tablet 0    cloZAPine (CLOZARIL) 50 MG tablet Take 1 tablet (50 mg total) by mouth daily. 30 tablet 0    cloZAPine (CLOZARIL) 50 MG tablet Take 5 tablets (250 mg total) by mouth at bedtime. 30 tablet 0    divalproex (DEPAKOTE) 500 MG DR tablet Take 1 tablet (500 mg total) by mouth every 12 (twelve) hours. 60 tablet 0    glycopyrrolate (ROBINUL) 2 MG tablet Take by mouth.      paliperidone (INVEGA SUSTENNA) 234 MG/1.5ML SUSY injection Inject 234 mg into the muscle every 28 (twenty-eight) days. 1.5 mL 3    paliperidone (INVEGA SUSTENNA) 234 MG/1.5ML  SUSY injection INJECT AT FACILITY 1.5 mL 0    temazepam (RESTORIL) 15 MG capsule Take 1 capsule (15 mg total) by mouth at bedtime as needed for sleep. 30 capsule 0     Musculoskeletal: Strength & Muscle Tone: within normal limits Gait & Station: normal Patient leans: N/A            Psychiatric Specialty Exam:  Presentation  General Appearance: Casual; Well Groomed  Eye Contact:Good  Speech:Slow  Speech Volume:Decreased  Handedness:Right   Mood and Affect  Mood:Anxious  Affect:Inappropriate; Constricted   Thought Process  Thought Processes:Disorganized  Duration of Psychotic Symptoms: Greater than six months  Past Diagnosis of Schizophrenia or Psychoactive disorder: Yes  Descriptions of Associations:Circumstantial  Orientation:Full (Time, Place and Person)  Thought Content:Paranoid Ideation; Scattered; Illogical  Hallucinations:Hallucinations: None  Ideas of Reference:Paranoia; Delusions  Suicidal Thoughts:Suicidal Thoughts: No  Homicidal Thoughts:Homicidal Thoughts: No   Sensorium  Memory:Immediate Poor; Recent Poor; Remote Poor  Judgment:Poor  Insight:Poor   Executive Functions  Concentration:Fair  Attention Span:Poor  Recall:Poor  Fund of Knowledge:Poor  Language:Poor   Psychomotor Activity  Psychomotor Activity:Psychomotor Activity: Normal   Assets  Assets:Communication Skills; Desire for Improvement; Resilience; Social Support   Sleep  Sleep:Sleep: Good Number of Hours of Sleep: 8    Physical Exam: Physical Exam Vitals and nursing note reviewed.  Constitutional:      Appearance: Normal appearance.  HENT:     Head: Normocephalic and atraumatic.     Mouth/Throat:     Pharynx: Oropharynx is clear.  Eyes:     Pupils: Pupils are equal, round, and reactive to light.  Cardiovascular:     Rate and Rhythm: Normal rate and regular rhythm.  Pulmonary:     Effort: Pulmonary effort is normal.     Breath sounds:  Normal breath sounds.  Abdominal:     General: Abdomen is flat.     Palpations: Abdomen is soft.  Musculoskeletal:        General: Normal range of motion.  Skin:    General: Skin is warm and dry.  Neurological:     General: No focal deficit present.     Mental Status: He is alert. Mental status is at baseline.  Psychiatric:        Attention and Perception: He is inattentive.        Mood and Affect: Mood is anxious. Affect is blunt.        Speech: Speech is delayed.        Behavior: Behavior is slowed.        Thought Content: Thought content is paranoid. Thought content  does not include suicidal ideation.        Cognition and Memory: Cognition is impaired. Memory is impaired.        Judgment: Judgment is impulsive.   Review of Systems  Constitutional: Negative.   HENT: Negative.    Eyes: Negative.   Respiratory: Negative.    Cardiovascular: Negative.   Gastrointestinal: Negative.   Musculoskeletal: Negative.   Skin: Negative.   Neurological: Negative.   Psychiatric/Behavioral:  Positive for hallucinations, memory loss and substance abuse. Negative for depression and suicidal ideas. The patient is nervous/anxious and has insomnia.   Blood pressure 109/80, pulse 69, temperature 98.6 F (37 C), temperature source Oral, resp. rate 18, height 6\' 1"  (1.854 m), weight 103.4 kg, SpO2 100 %. Body mass index is 30.08 kg/m.  Treatment Plan Summary: Medication management and Plan restart Depakote.  Restart clozapine but he needs to first have a neutrophil count.  Got a shot in the emergency room of his Invega.  Get full labs done.  Continue 15-minute checks get treatment team evaluation done and have daily evaluation and inclusion of patient in rounds and observation with the goal of improvement in functioning and cognitive abilities and decrease of psychosis  Observation Level/Precautions:  15 minute checks  Laboratory:  UDS  Psychotherapy:    Medications:    Consultations:     Discharge Concerns:    Estimated LOS:  Other:     Physician Treatment Plan for Primary Diagnosis: Schizophrenia (HCC) Long Term Goal(s): Improvement in symptoms so as ready for discharge  Short Term Goals: Ability to verbalize feelings will improve, Ability to demonstrate self-control will improve, and Ability to identify and develop effective coping behaviors will improve  Physician Treatment Plan for Secondary Diagnosis: Principal Problem:   Schizophrenia (HCC) Active Problems:   Hypertension  Long Term Goal(s): Improvement in symptoms so as ready for discharge  Short Term Goals: Ability to maintain clinical measurements within normal limits will improve and Compliance with prescribed medications will improve  I certify that inpatient services furnished can reasonably be expected to improve the patient's condition.    Mordecai Rasmussen, MD 5/16/20234:48 PM

## 2021-12-04 NOTE — ED Notes (Addendum)
Offered book, pt reading in hallway bed now. ? ?Declined shower. ?

## 2021-12-04 NOTE — ED Notes (Signed)
Offered patient room 20, declines. Pt pacing in front of 19H. Asked him to stay in bed to avoid blocking traffic in hallway. Pt agrees to do so. Pt given snack until breakfast comes. ?

## 2021-12-04 NOTE — Tx Team (Signed)
Initial Treatment Plan ?12/04/2021 ?1:04 PM ?[facility] ?[id number] ? ? ? ?PATIENT STRESSORS: ?Marital or family conflict   ?Medication change or noncompliance   ? ? ?PATIENT STRENGTHS: ?General fund of knowledge  ?Supportive family/friends  ? ? ?PATIENT IDENTIFIED PROBLEMS: ?Medication noncompliance  ?Anxiety  ?Aggression towards family  ?Paranoia  ?  ?  ?  ?  ?  ?  ? ?DISCHARGE CRITERIA:  ?Ability to meet basic life and health needs ?Improved stabilization in mood, thinking, and/or behavior ?Medical problems require only outpatient monitoring ?Need for constant or close observation no longer present ?Reduction of life-threatening or endangering symptoms to within safe limits ? ?PRELIMINARY DISCHARGE PLAN: ?Outpatient therapy ?Return to previous living arrangement ? ?PATIENT/FAMILY INVOLVEMENT: ?This treatment plan has been presented to and reviewed with the patient, [name] [name]. The patient has been given the opportunity to ask questions and make suggestions. ? ?Lyda Kalata, RN ?12/04/2021, 1:04 PM ?

## 2021-12-04 NOTE — [facility] Suicide Risk Assessment (Signed)
[facility] Admission Suicide Risk Assessment ? ? ?Nursing information obtained from:  Patient ?Demographic factors:  Male, Adolescent or young adult, Unemployed ?Current Mental Status:  NA ?Loss Factors:  NA ?Historical Factors:  NA ?Risk Reduction Factors:  Living with another person, especially a relative ? ?Total Time spent with patient: 1 hour ?Principal Problem: Schizophrenia (HCC) ?Diagnosis:  Principal Problem: ?  Schizophrenia (HCC) ?Active Problems: ?  Hypertension ? ?Subjective Data: Patient seen and chart reviewed.  26 year old man with history of schizophrenia brought in under IVC because of worsening behavior and paranoia.  Has been off his medicine for months.  Patient denies suicidal ideation but has been disruptive and frightening to family.  Currently agreeable to medication and not showing dangerous behavior ? ?Continued Clinical Symptoms:  ?Alcohol Use Disorder Identification Test Final Score (AUDIT): 0 ?The "Alcohol Use Disorders Identification Test", Guidelines for Use in Primary Care, Second Edition.  World Science writer [facility]). ?Score between 0-7:  no or low risk or alcohol related problems. ?Score between 8-15:  moderate risk of alcohol related problems. ?Score between 16-19:  high risk of alcohol related problems. ?Score 20 or above:  warrants further diagnostic evaluation for alcohol dependence and treatment. ? ? ?CLINICAL FACTORS:  ? Schizophrenia:   Less than 42 years old ? ? ?Musculoskeletal: ?Strength & Muscle Tone: within normal limits ?Gait & Station: normal ?Patient leans: N/A ? ?Psychiatric Specialty Exam: ? ?Presentation  ?General Appearance: Casual; Well Groomed ? ?Eye Contact:Good ? ?Speech:Slow ? ?Speech Volume:Decreased ? ?Handedness:Right ? ? ?Mood and Affect  ?Mood:Anxious ? ?Affect:Inappropriate; Constricted ? ? ?Thought Process  ?Thought Processes:Disorganized ? ?Descriptions of Associations:Circumstantial ? ?Orientation:Full (Time, Place and Person) ? ?Thought  Content:Paranoid Ideation; Scattered; Illogical ? ?History of Schizophrenia/Schizoaffective disorder:Yes ? ?Duration of Psychotic Symptoms:Greater than six months ? ?Hallucinations:Hallucinations: None ? ?Ideas of Reference:Paranoia; Delusions ? ?Suicidal Thoughts:Suicidal Thoughts: No ? ?Homicidal Thoughts:Homicidal Thoughts: No ? ? ?Sensorium  ?Memory:Immediate Poor; Recent Poor; Remote Poor ? ?Judgment:Poor ? ?Insight:Poor ? ? ?Executive Functions  ?Concentration:Fair ? ?Attention Span:Poor ? ?Recall:Poor ? ?Fund of Knowledge:Poor ? ?Language:Poor ? ? ?Psychomotor Activity  ?Psychomotor Activity:Psychomotor Activity: Normal ? ? ?Assets  ?Assets:Communication Skills; Desire for Improvement; Resilience; Social Support ? ? ?Sleep  ?Sleep:Sleep: Good ?Number of Hours of Sleep: 8 ? ? ? ?Physical Exam: ?Physical Exam ?Vitals and nursing note reviewed.  ?Constitutional:   ?   Appearance: Normal appearance.  ?HENT:  ?   Head: Normocephalic and atraumatic.  ?   Mouth/Throat:  ?   Pharynx: Oropharynx is clear.  ?Eyes:  ?   Pupils: Pupils are equal, round, and reactive to light.  ?Cardiovascular:  ?   Rate and Rhythm: Normal rate and regular rhythm.  ?Pulmonary:  ?   Effort: Pulmonary effort is normal.  ?   Breath sounds: Normal breath sounds.  ?Abdominal:  ?   General: Abdomen is flat.  ?   Palpations: Abdomen is soft.  ?Musculoskeletal:     ?   General: Normal range of motion.  ?Skin: ?   General: Skin is warm and dry.  ?Neurological:  ?   General: No focal deficit present.  ?   Mental Status: He is alert. Mental status is at baseline.  ?Psychiatric:     ?   Attention and Perception: He is inattentive. He perceives auditory hallucinations.     ?   Mood and Affect: Mood is anxious. Affect is blunt.     ?   Speech: He is noncommunicative. Speech is delayed.     ?  Behavior: Behavior is slowed and withdrawn.     ?   Thought Content: Thought content is paranoid.     ?   Cognition and Memory: Cognition is impaired.     ?    Judgment: Judgment is inappropriate.  ? ?Review of Systems  ?Constitutional: Negative.   ?HENT: Negative.    ?Eyes: Negative.   ?Respiratory: Negative.    ?Cardiovascular: Negative.   ?Gastrointestinal: Negative.   ?Musculoskeletal: Negative.   ?Skin: Negative.   ?Neurological: Negative.   ?Psychiatric/Behavioral:  Positive for depression, hallucinations, memory loss and substance abuse. Negative for suicidal ideas. The patient is nervous/anxious and has insomnia.   ?Blood pressure 109/80, pulse 69, temperature 98.6 ?F (37 ?C), temperature source Oral, resp. rate 18, height 6\' 1"  (1.854 m), weight 103.4 kg, SpO2 100 %. Body mass index is 30.08 kg/m?. ? ? ?COGNITIVE FEATURES THAT CONTRIBUTE TO RISK:  ?Thought constriction (tunnel vision)   ? ?SUICIDE RISK:  ? Minimal: No identifiable suicidal ideation.  Patients presenting with no risk factors but with morbid ruminations; may be classified as minimal risk based on the severity of the depressive symptoms ? ?PLAN OF CARE: Restart medications for schizophrenia.  Continue 15-minute checks.  Full treatment team evaluation.  Inclusion in individual and group therapy. ? ?I certify that inpatient services furnished can reasonably be expected to improve the patient's condition.  ? ? , MD ?12/04/2021, 4:46 PM ? ?

## 2021-12-04 NOTE — Group Note (Signed)
[facility] LCSW Group Therapy Note ? ? ?Group Date: 12/04/2021 ?Start Time: 1300 ?End Time: 1400 ? ?Type of Therapy/Topic:  Group Therapy:  Feelings about Diagnosis ? ?Participation Level:  Did Not Attend  ? ? ?Description of Group:   ? This group will allow patients to explore their thoughts and feelings about diagnoses they have received. Patients will be guided to explore their level of understanding and acceptance of these diagnoses. Facilitator will encourage patients to process their thoughts and feelings about the reactions of others to their diagnosis, and will guide patients in identifying ways to discuss their diagnosis with significant others in their lives. This group will be process-oriented, with patients participating in exploration of their own experiences as well as giving and receiving support and challenge from other group members. ? ? ?Therapeutic Goals: ?1. Patient will demonstrate understanding of diagnosis as evidence by identifying two or more symptoms of the disorder:  ?2. Patient will be able to express two feelings regarding the diagnosis ?3. Patient will demonstrate ability to communicate their needs through discussion and/or role plays ? ?Summary of Patient Progress: ?X ? ? ? ?Therapeutic Modalities:   ?Cognitive Behavioral Therapy ?Brief Therapy ?Feelings Identification  ? ? ?Janyce Ellinger R Demeka Sutter, LCSW ?

## 2021-12-04 NOTE — ED Provider Notes (Signed)
? ?[facility] ?Provider Note ? ? ? Event Date/Time  ? First MD Initiated Contact with Patient 12/03/21 1939   ?  (approximate) ? ? ?History  ? ?Psychiatric Evaluation ? ? ?HPI ? ?Ken A Pentecost is a 26 y.o. male with past medical history of asthma, depression, psychosis schizoaffective disorder presents under IVC.  Per IVC paperwork patient has been noncompliant with his Depakote and has been acting aggressive.  Patient tells me that he is here because his anxiety has been acting up.  Feeling quite anxious denies suicidal ideation.  Initially says he is not taking his medications for his mental health problems but then says he is denies any medical complaints he is not suicidal. ?  ? ?Past Medical History:  ?Diagnosis Date  ? Asthma   ? Depression   ? Psychosis (HCC)   ? Schizoaffective disorder (HCC)   ? ? ?Patient Active Problem List  ? Diagnosis Date Noted  ? Hypertension 03/06/2018  ? Schizoaffective disorder, bipolar type (HCC) 12/30/2017  ? Catatonia 02/03/2017  ? ? ? ?Physical Exam  ?Triage Vital Signs: ?ED Triage Vitals  ?Enc Vitals Group  ?   BP 12/03/21 1908 110/70  ?   Pulse Rate 12/03/21 1908 63  ?   Resp 12/03/21 1908 20  ?   Temp 12/03/21 1908 98.7 ?F (37.1 ?C)  ?   Temp Source 12/03/21 1908 Oral  ?   SpO2 12/03/21 1908 98 %  ?   Weight 12/03/21 1908 240 lb (108.9 kg)  ?   Height 12/03/21 1908 6\' 1"  (1.854 m)  ?   Head Circumference --   ?   Peak Flow --   ?   Pain Score 12/03/21 1906 0  ?   Pain Loc --   ?   Pain Edu? --   ?   Excl. in GC? --   ? ? ?Most recent vital signs: ?Vitals:  ? 12/03/21 1908  ?BP: 110/70  ?Pulse: 63  ?Resp: 20  ?Temp: 98.7 ?F (37.1 ?C)  ?SpO2: 98%  ? ? ? ?General: Awake, no distress.  ?CV:  Good peripheral perfusion.  ?Resp:  Normal effort.  ?Abd:  No distention.  ?Neuro:             Awake, Alert, Oriented x 3  ?Other:  Patient has flat affect he is calm and cooperative ? ? ?ED Results / Procedures / Treatments  ?Labs ?(all labs ordered are listed, but  only abnormal results are displayed) ?Labs Reviewed  ?SALICYLATE LEVEL - Abnormal; Notable for the following components:  ?    Result Value  ? Salicylate Lvl <7.0 (*)   ? All other components within normal limits  ?ACETAMINOPHEN LEVEL - Abnormal; Notable for the following components:  ? Acetaminophen (Tylenol), Serum <10 (*)   ? All other components within normal limits  ?RESP PANEL BY RT-PCR (FLU A&B, COVID) ARPGX2  ?COMPREHENSIVE METABOLIC PANEL  ?ETHANOL  ?CBC  ?URINE DRUG SCREEN, QUALITATIVE ([facility] ONLY)  ? ? ? ?EKG ? ? ? ? ?RADIOLOGY ? ? ? ?PROCEDURES: ? ?Critical Care performed: No ? ?Procedures ? ?s. ? ? ?MEDICATIONS ORDERED IN ED: ?Medications - No data to display ? ? ?IMPRESSION / MDM / ASSESSMENT AND PLAN / ED COURSE  ?I reviewed the triage vital signs and the nursing notes. ?             ?               ? ?  Differential diagnosis includes, but is not limited to, medication noncompliance, acute psychosis, drug-induced mood disorder ? ?Patient is a 26 year old male with underlying mental health problems presenting under IVC for aggressive behavior medication noncompliance.  Patient's vitals are within normal limits does have flat affect but is cooperative tells me he is here because of his anxiety.  Denies suicidal ideation and says he is compliant with his medication.  Initially refusing labs but then later was amenable to lab work which is all reassuring including CBC CMP Tylenol and salicylate levels.  Given the concern from family will ask psych to see. ? ?The patient has been placed in psychiatric observation due to the need to provide a safe environment for the patient while obtaining psychiatric consultation and evaluation, as well as ongoing medical and medication management to treat the patient's condition.  The patient has been placed under full IVC at this time.  ? ?  ? ? ?FINAL CLINICAL IMPRESSION(S) / ED DIAGNOSES  ? ?Final diagnoses:  ?Aggressive behavior  ? ? ? ?Rx / DC Orders  ? ?ED Discharge  Orders   ? ? None  ? ?  ? ? ? ?Note:  This document was prepared using Dragon voice recognition software and may include unintentional dictation errors. ?  ?Georga Hacking, MD ?12/04/21 0006 ? ?

## 2021-12-04 NOTE — Progress Notes (Signed)
Patient ID: [name], male   DOB: Apr 21, 1996, 26 y.o.   MRN: [id number] ? ?Admission Note:  ? ?Report was received from [place], RN on a 26 year-old male who presents IVC in no acute distress for the treatment of Anxiety, aggression towards family members and medication noncompliance. Patient appears flat and paranoid. Although patient was hesitant and refused to complete the skin assessment, in the bathroom located in the search room, patient was calm and cooperative with admission process. Patient denies SI/HI/AVH and pain at this time. Patient also denies any signs/symptoms of depression and anxiety, stating that overall he is "feeling good". Patient has no stated stressors, nor goals for treatment. Patient has a past medical history of Asthma and Schizoaffective disorder, Bipolar type. Skin was assessed, in patient's room, with [facility], RN and found to be clear of any abnormal marks and scars. Patient searched and no contraband found and unit policies explained and understanding verbalized. Patient refused to sign consents, stating "I'm not signing anything". Food and fluids offered, and neither accepted at this time. Patient had no additional questions or concerns to voice to this Clinical research associate. Patient remains safe on the unit. ? ? ?

## 2021-12-04 NOTE — ED Provider Notes (Signed)
Emergency Medicine Observation Re-evaluation Note ? ?[name] [name] is a 26 y.o. male, seen on rounds today.  Pt initially presented to the ED for complaints of Psychiatric Evaluation ?Currently, the patient is resting, voices no medical complaints. ? ?Physical Exam  ?BP 110/70   Pulse 63   Temp 98.7 ?F (37.1 ?C) (Oral)   Resp 20   Ht 6\' 1"  (1.854 m)   Wt 108.9 kg   SpO2 98%   BMI 31.66 kg/m?  ?Physical Exam ?General: Resting in no acute distress ?Cardiac: No cyanosis ?Lungs: Equal rise and fall ?Psych: Not agitated ? ?ED Course / MDM  ?EKG:  ? ?I have reviewed the labs performed to date as well as medications administered while in observation.  Recent changes in the last 24 hours include no events overnight. ? ?Plan  ?Current plan is for psychiatric disposition. ?Julies A Poust is under involuntary commitment. ?  ? ?  ? , MD ?12/04/21 [phone] ? ?

## 2021-12-04 NOTE — ED Notes (Addendum)
Breakfast provided, 100% food eaten, 8oz juice.  ?

## 2021-12-04 NOTE — Progress Notes (Signed)
Patient refused to sign consents upon admission. Patient also refused to allow nursing to perform the skin assessment in the search room, so this Probation officer along with Arlyss Repress, RN, performed patient skin assessment in patient's room.  ?

## 2021-12-04 NOTE — Progress Notes (Signed)
Patient did go outside, to the courtyard, with staff and other members on the unit. This Clinical research associate was informed that patient spoke to staff, slightly and he would throw the basketball back to the patients whenever it went out of bounds. Patient is now back inside and has started to pace the hallway again. Patient remains safe on the unit. ?

## 2021-12-05 DIAGNOSIS — F203 Undifferentiated schizophrenia: Secondary | ICD-10-CM | POA: Diagnosis not present

## 2021-12-05 NOTE — Progress Notes (Signed)
Orders noted for lab. Attempted x 3 for patient to get labwork done. Patient had been calm, cooperative, but then when informed he needed bloodwork he refused, stating '' no I can't do it.'' Patient appeared to try to not put on his shoe and then stated his '' ankle was hurting and he could not walk to lab room.'' Patient then took his two chairs to barricade himself in room. Informed pt lab would return in am and pt calmed. Pt stated '' I just can't do it., my paranoia is working with me '' ?

## 2021-12-05 NOTE — BH IP Treatment Plan (Signed)
Interdisciplinary Treatment and Diagnostic Plan Update ? ?12/05/2021 ?Time of Session: 9:30AM ?[place] ?MRN: [id number] ? ?Principal Diagnosis: Schizophrenia ([place]) ? ?Secondary Diagnoses: Principal Problem: ?  Schizophrenia ([place]) ?Active Problems: ?  Hypertension ? ? ?Current Medications:  ?Current Facility-Administered Medications  ?Medication Dose Route Frequency Provider Last Rate Last Admin  ? acetaminophen (TYLENOL) tablet 650 mg  650 mg Oral Q6H PRN Sherlon Handing, NP      ? alum & mag hydroxide-simeth (MAALOX/MYLANTA) 200-200-20 MG/5ML suspension 30 mL  30 mL Oral Q4H PRN Waldon Merl F, NP      ? cloZAPine (CLOZARIL) tablet 50 mg  50 mg Oral QHS [name], [name], MD      ? divalproex (DEPAKOTE) DR tablet 500 mg  500 mg Oral Q12H [name], [name], MD      ? magnesium hydroxide (MILK OF MAGNESIA) suspension 30 mL  30 mL Oral Daily PRN Sherlon Handing, NP      ? ?PTA Medications: ?Medications Prior to Admission  ?Medication Sig Dispense Refill Last Dose  ? divalproex (DEPAKOTE) 500 MG DR tablet Take 1 tablet (500 mg total) by mouth every 12 (twelve) hours. 60 tablet 0 11/30/2021  ? amLODipine (NORVASC) 10 MG tablet Take 1 tablet (10 mg total) by mouth daily. (Patient not taking: Reported on 12/04/2021) 30 tablet 0 Not Taking  ? cloZAPine (CLOZARIL) 50 MG tablet Take 1 tablet (50 mg total) by mouth daily. (Patient not taking: Reported on 12/04/2021) 30 tablet 0 Not Taking  ? cloZAPine (CLOZARIL) 50 MG tablet Take 5 tablets (250 mg total) by mouth at bedtime. (Patient not taking: Reported on 12/04/2021) 30 tablet 0 Not Taking  ? glycopyrrolate (ROBINUL) 2 MG tablet Take by mouth. (Patient not taking: Reported on 12/04/2021)   Not Taking  ? paliperidone (INVEGA SUSTENNA) 234 MG/1.5ML SUSY injection Inject 234 mg into the muscle every 28 (twenty-eight) days. (Patient not taking: Reported on 12/04/2021) 1.5 mL 3 Not Taking  ? paliperidone (INVEGA SUSTENNA) 234 MG/1.5ML SUSY injection INJECT AT FACILITY 1.5  mL 0   ? temazepam (RESTORIL) 15 MG capsule Take 1 capsule (15 mg total) by mouth at bedtime as needed for sleep. (Patient not taking: Reported on 12/04/2021) 30 capsule 0 Not Taking  ? ? ?Patient Stressors: Marital or family conflict   ?Medication change or noncompliance   ? ?Patient Strengths: General fund of knowledge  ?Supportive family/friends  ? ?Treatment Modalities: Medication Management, Group therapy, Case management,  ?1 to 1 session with clinician, Psychoeducation, Recreational therapy. ? ? ?Physician Treatment Plan for Primary Diagnosis: Schizophrenia ([place]) ?Long Term Goal(s): Improvement in symptoms so as ready for discharge  ? ?Short Term Goals: Ability to maintain clinical measurements within normal limits will improve ?Compliance with prescribed medications will improve ?Ability to verbalize feelings will improve ?Ability to demonstrate self-control will improve ?Ability to identify and develop effective coping behaviors will improve ? ?Medication Management: Evaluate patient's response, side effects, and tolerance of medication regimen. ? ?Therapeutic Interventions: 1 to 1 sessions, Unit Group sessions and Medication administration. ? ?Evaluation of Outcomes: Not Met ? ?Physician Treatment Plan for Secondary Diagnosis: Principal Problem: ?  Schizophrenia ([place]) ?Active Problems: ?  Hypertension ? ?Long Term Goal(s): Improvement in symptoms so as ready for discharge  ? ?Short Term Goals: Ability to maintain clinical measurements within normal limits will improve ?Compliance with prescribed medications will improve ?Ability to verbalize feelings will improve ?Ability to demonstrate self-control will improve ?Ability to identify and develop effective coping behaviors  will improve    ? ?Medication Management: Evaluate patient's response, side effects, and tolerance of medication regimen. ? ?Therapeutic Interventions: 1 to 1 sessions, Unit Group sessions and Medication administration. ? ?Evaluation of  Outcomes: Not Met ? ? ?RN Treatment Plan for Primary Diagnosis: Schizophrenia ([place]) ?Long Term Goal(s): Knowledge of disease and therapeutic regimen to maintain health will improve ? ?Short Term Goals: Ability to demonstrate self-control, Ability to participate in decision making will improve, Ability to verbalize feelings will improve, Ability to disclose and discuss suicidal ideas, Ability to identify and develop effective coping behaviors will improve, and Compliance with prescribed medications will improve ? ?Medication Management: RN will administer medications as ordered by provider, will assess and evaluate patient's response and provide education to patient for prescribed medication. RN will report any adverse and/or side effects to prescribing provider. ? ?Therapeutic Interventions: 1 on 1 counseling sessions, Psychoeducation, Medication administration, Evaluate responses to treatment, Monitor vital signs and CBGs as ordered, Perform/monitor CIWA, COWS, AIMS and Fall Risk screenings as ordered, Perform wound care treatments as ordered. ? ?Evaluation of Outcomes: Not Met ? ? ?LCSW Treatment Plan for Primary Diagnosis: Schizophrenia ([place]) ?Long Term Goal(s): Safe transition to appropriate next level of care at discharge, Engage patient in therapeutic group addressing interpersonal concerns. ? ?Short Term Goals: Engage patient in aftercare planning with referrals and resources, Increase social support, Increase ability to appropriately verbalize feelings, Increase emotional regulation, Facilitate acceptance of mental health diagnosis and concerns, and Increase skills for wellness and recovery ? ?Therapeutic Interventions: Assess for all discharge needs, 1 to 1 time with Education officer, museum, Explore available resources and support systems, Assess for adequacy in community support network, Educate family and significant other(s) on suicide prevention, Complete Psychosocial Assessment, Interpersonal group  therapy. ? ?Evaluation of Outcomes: Not Met ? ? ?Progress in Treatment: ?Attending groups: No. ?Participating in groups: No. ?Taking medication as prescribed: No. ?Toleration medication: No. ?Family/Significant other contact made: No, will contact:  No once permission is given. ?Patient understands diagnosis: Yes. ?Discussing patient identified problems/goals with staff: Yes. ?Medical problems stabilized or resolved: Yes. ?Denies suicidal/homicidal ideation: Yes. ?Issues/concerns per patient self-inventory: No. ?Other: none ? ?New problem(s) identified: No, Describe:  none ? ?New Short Term/Long Term Goal(s): elimination of symptoms of psychosis, medication management for mood stabilization; elimination of SI thoughts; development of comprehensive mental wellness plan ? ?Patient Goals:  "learning to think about things clearly and focus, imagine things clearly" ? ?Discharge Plan or Barriers: CSW will assist patient with developing appropriate discharge plans.   ? ?Reason for Continuation of Hospitalization: Anxiety ?Depression ?Medication stabilization ?Suicidal ideation ? ?Estimated Length of Stay:  1-7 days ? ?Last 3 [place] Suicide Severity Risk Score: ?[place] Admission (Current) from 12/04/2021 in [place] ED from 12/03/2021 in [place] Admission (Discharged) from 07/20/2020 in [place] [facility]  ?C-SSRS RISK CATEGORY No Risk No Risk No Risk  ? ?  ? ? ?Last PHQ 2/9 Scores: ?   ? View : No data to display.  ?  ?  ?  ? ? ?Scribe for Treatment Team: ?Rozann Lesches, LCSW ?12/05/2021 ?9:47 AM ? ? ?

## 2021-12-05 NOTE — Plan of Care (Signed)
?  Problem: Education: ?Goal: Mental status will improve ?Outcome: Not Progressing ?  ?Problem: Health Behavior/Discharge Planning: ?Goal: Compliance with treatment plan for underlying cause of condition will improve ?Outcome: Not Progressing ?  ?Problem: Safety: ?Goal: Periods of time without injury will increase ?Outcome: Progressing ?  ?

## 2021-12-05 NOTE — Progress Notes (Signed)
Patient presents with paranoid, suspicious mood, affect blunted. [name] [name] has been observed on the unit with wide eyed stare, pacing, with blanket around his head. He states he is fine and denies any auditory hallucinations but is observed responding to internal stimuli. He states he is eating and drinking well however he is observed to only request wrapped items from staff. Pt also refused to take am depakote dose, stating to writer '' no I don't need that. I can't take that because I'm having a panic attack. '' Patient then offered medication for anxiety and he states '' no I don't need anything. I'm fine'' Above discussed with treatment team today. Patient did attend treatment team meeting with staff, states goal is to '' think more clearly and focus. ''  Pt is safe, will con't to monitor. ?

## 2021-12-05 NOTE — Progress Notes (Signed)
Recreation Therapy Notes ? ?Date: 12/05/2021 ? ?Time: 10:35 am   ? ?Location: Craft room  ? ?Behavioral response: N/A ?  ?Intervention Topic: Self-care  ? ?Discussion/Intervention: ?Patient refused to attend group.  ? ?Clinical Observations/Feedback:  ?Patient refused to attend group.  ?  ?Arrietty Dercole LRT/CTRS ? ? ? ? ? ? ? ?[name] [name] ?12/05/2021 12:42 PM ?

## 2021-12-05 NOTE — Progress Notes (Signed)
Patient presents with flat affect. Noted pacing halls most of shift and continuing still. Denies any SI, HI, AVH. Patient suspicious, stared at medications for long period of time, continues to say I will take them later. Patient later  refused medications.  Patient noted guarded with minimal interaction with staff and peers. Continues to pace back and forth from bedroom to nurses station. No interaction. ?Encouragement and support provided. Safety checks maintained. Medications given as prescribed. Pt receptive and remains safe on unit with q 15 min checks. ? ?

## 2021-12-05 NOTE — Progress Notes (Signed)
[facility] MD Progress Note ? ?12/05/2021 10:39 AM ?[name] [name]  ?MRN:  [id number] ?Subjective: Patient seen for follow-up.  26 year old man with schizophrenia.  Patient attended treatment team as well.  He continues to do a lot of pacing on the unit and was up much of the night pacing.  He is not violent and when we sat down together for treatment team he cooperated but maintained his wide-eyed blank stare.  He did state some goals which were vaguely stated to get his thinking better.  He will acknowledge hallucinations but minimizes it.  Appears at times to be responding to internal stimuli.  Intermittently refusing medication for no really clear reason. ?Principal Problem: Schizophrenia (HCC) ?Diagnosis: Principal Problem: ?  Schizophrenia (HCC) ?Active Problems: ?  Hypertension ? ?Total Time spent with patient: 30 minutes ? ?Past Psychiatric History: Past history of schizophrenia ? ?Past Medical History:  ?Past Medical History:  ?Diagnosis Date  ? Asthma   ? Depression   ? Psychosis (HCC)   ? Schizoaffective disorder (HCC)   ?  ?Past Surgical History:  ?Procedure Laterality Date  ? BACK SURGERY    ? I&D groin  2017  ? ?Family History: History reviewed. No pertinent family history. ?Family Psychiatric  History: See previous ?Social History:  ?Social History  ? ?Substance and Sexual Activity  ?Alcohol Use No  ?   ?Social History  ? ?Substance and Sexual Activity  ?Drug Use Never  ?  ?Social History  ? ?Socioeconomic History  ? Marital status: Single  ?  Spouse name: Not on file  ? Number of children: Not on file  ? Years of education: Not on file  ? Highest education level: Not on file  ?Occupational History  ? Not on file  ?Tobacco Use  ? Smoking status: Never  ? Smokeless tobacco: Never  ?Vaping Use  ? Vaping Use: Some days  ?Substance and Sexual Activity  ? Alcohol use: No  ? Drug use: Never  ? Sexual activity: Not Currently  ?Other Topics Concern  ? Not on file  ?Social History Narrative  ? Not on file  ? ?Social  Determinants of Health  ? ?Financial Resource Strain: Not on file  ?Food Insecurity: Not on file  ?Transportation Needs: Not on file  ?Physical Activity: Not on file  ?Stress: Not on file  ?Social Connections: Not on file  ? ?Additional Social History:  ?  ?  ?  ?  ?  ?  ?  ?  ?  ?  ?  ? ?Sleep: Fair ? ?Appetite:  Negative ? ?Current Medications: ?Current Facility-Administered Medications  ?Medication Dose Route Frequency Provider Last Rate Last Admin  ? acetaminophen (TYLENOL) tablet 650 mg  650 mg Oral Q6H PRN Vanetta Mulders, NP      ? alum & mag hydroxide-simeth (MAALOX/MYLANTA) 200-200-20 MG/5ML suspension 30 mL  30 mL Oral Q4H PRN Vanetta Mulders, NP      ? cloZAPine (CLOZARIL) tablet 50 mg  50 mg Oral QHS Ira Busbin T, MD      ? divalproex (DEPAKOTE) DR tablet 500 mg  500 mg Oral Q12H Caswell Alvillar T, MD      ? magnesium hydroxide (MILK OF MAGNESIA) suspension 30 mL  30 mL Oral Daily PRN Vanetta Mulders, NP      ? ? ?Lab Results:  ?Results for orders placed or performed during the hospital encounter of 12/04/21 (from the past 48 hour(s))  ?Hemoglobin A1c     Status:  None  ? Collection Time: 12/03/21  9:28 PM  ?Result Value Ref Range  ? Hgb A1c MFr Bld 5.1 4.8 - 5.6 %  ?  Comment: (NOTE) ?Pre diabetes:          5.7%-6.4% ? ?Diabetes:              >6.4% ? ?Glycemic control for   <7.0% ?adults with diabetes ?  ? Mean Plasma Glucose 99.67 mg/dL  ?  Comment: Performed at [facility], [address] [place], [place] [address]  ? ? ?Blood Alcohol level:  ?Lab Results  ?Component Value Date  ? ETH <10 12/03/2021  ? ETH <10 07/18/2020  ? ? ?Metabolic Disorder Labs: ?Lab Results  ?Component Value Date  ? HGBA1C 5.1 12/03/2021  ? MPG 99.67 12/03/2021  ? MPG 91.06 07/19/2020  ? ?No results found for: PROLACTIN ?Lab Results  ?Component Value Date  ? CHOL 256 (H) 07/19/2020  ? TRIG 137 07/19/2020  ? HDL 52 07/19/2020  ? CHOLHDL 4.9 07/19/2020  ? VLDL 27 07/19/2020  ? LDLCALC 177 (H) 07/19/2020  ?  LDLCALC 95 06/18/2020  ? ? ?Physical Findings: ?AIMS:  , ,  ,  ,    ?CIWA:    ?COWS:    ? ?Musculoskeletal: ?Strength & Muscle Tone: within normal limits ?Gait & Station: normal ?Patient leans: N/A ? ?Psychiatric Specialty Exam: ? ?Presentation  ?General Appearance: Casual; Well Groomed ? ?Eye Contact:Good ? ?Speech:Slow ? ?Speech Volume:Decreased ? ?Handedness:Right ? ? ?Mood and Affect  ?Mood:Anxious ? ?Affect:Inappropriate; Constricted ? ? ?Thought Process  ?Thought Processes:Disorganized ? ?Descriptions of Associations:Circumstantial ? ?Orientation:Full (Time, Place and Person) ? ?Thought Content:Paranoid Ideation; Scattered; Illogical ? ?History of Schizophrenia/Schizoaffective disorder:Yes ? ?Duration of Psychotic Symptoms:Greater than six months ? ?Hallucinations:No data recorded ?Ideas of Reference:Paranoia; Delusions ? ?Suicidal Thoughts:No data recorded ?Homicidal Thoughts:No data recorded ? ?Sensorium  ?Memory:Immediate Poor; Recent Poor; Remote Poor ? ?Judgment:Poor ? ?Insight:Poor ? ? ?Executive Functions  ?Concentration:Fair ? ?Attention Span:Poor ? ?Recall:Poor ? ?Fund of Knowledge:Poor ? ?Language:Poor ? ? ?Psychomotor Activity  ?Psychomotor Activity:No data recorded ? ?Assets  ?Assets:Communication Skills; Desire for Improvement; Resilience; Social Support ? ? ?Sleep  ?Sleep:No data recorded ? ? ?Physical Exam: ?Physical Exam ?Vitals and nursing note reviewed.  ?Constitutional:   ?   Appearance: Normal appearance.  ?HENT:  ?   Head: Normocephalic and atraumatic.  ?   Mouth/Throat:  ?   Pharynx: Oropharynx is clear.  ?Eyes:  ?   Pupils: Pupils are equal, round, and reactive to light.  ?Cardiovascular:  ?   Rate and Rhythm: Normal rate and regular rhythm.  ?Pulmonary:  ?   Effort: Pulmonary effort is normal.  ?   Breath sounds: Normal breath sounds.  ?Abdominal:  ?   General: Abdomen is flat.  ?   Palpations: Abdomen is soft.  ?Musculoskeletal:     ?   General: Normal range of motion.  ?Skin: ?    General: Skin is warm and dry.  ?Neurological:  ?   General: No focal deficit present.  ?   Mental Status: He is alert. Mental status is at baseline.  ?Psychiatric:     ?   Attention and Perception: He is inattentive.     ?   Mood and Affect: Affect is blunt.     ?   Speech: Speech is delayed.     ?   Behavior: Behavior is slowed and withdrawn.     ?   Cognition and Memory:  Cognition is impaired.     ?   Judgment: Judgment is impulsive.  ? ?Review of Systems  ?Constitutional: Negative.   ?HENT: Negative.    ?Eyes: Negative.   ?Respiratory: Negative.    ?Cardiovascular: Negative.   ?Gastrointestinal: Negative.   ?Musculoskeletal: Negative.   ?Skin: Negative.   ?Neurological: Negative.   ?Psychiatric/Behavioral:  Positive for hallucinations. Negative for depression, substance abuse and suicidal ideas. The patient is nervous/anxious.   ?Blood pressure 109/80, pulse 69, temperature 98.6 ?F (37 ?C), temperature source Oral, resp. rate 18, height 6\' 1"  (1.854 m), weight 103.4 kg, SpO2 100 %. Body mass index is 30.08 kg/m?. ? ? ?Treatment Plan Summary: ?Medication management and Plan reviewed with the patient that I think the most important medication ultimately is his clozapine which can be given as an injection and requires his cooperation.  I think he also benefits from the Depakote.  We will continue working with trying to convince him to get back on his medicine.  Regular daily involvement in groups and activities. ? ?Mordecai RasmussenJohn Kalyb Pemble, MD ?12/05/2021, 10:39 AM ? ?

## 2021-12-05 NOTE — Plan of Care (Signed)
?  Problem: Education: ?Goal: Knowledge of [facility] Education information/materials will improve ?Outcome: Not Progressing ?Goal: Emotional status will improve ?Outcome: Not Progressing ?Goal: Mental status will improve ?Outcome: Not Progressing ?Goal: Verbalization of understanding the information provided will improve ?Outcome: Not Progressing ?  ?Problem: Health Behavior/Discharge Planning: ?Goal: Compliance with treatment plan for underlying cause of condition will improve ?Outcome: Not Progressing ?  ?Problem: Safety: ?Goal: Periods of time without injury will increase ?Outcome: Not Progressing ?  ?Problem: Self-Concept: ?Goal: Ability to identify factors that promote anxiety will improve ?Outcome: Not Progressing ?Goal: Level of anxiety will decrease ?Outcome: Not Progressing ?  ?Problem: Education: ?Goal: Will be free of psychotic symptoms ?Outcome: Not Progressing ?  ?Problem: Health Behavior/Discharge Planning: ?Goal: Compliance with prescribed medication regimen will improve ?Outcome: Not Progressing ?  ?

## 2021-12-05 NOTE — Group Note (Signed)
[facility] LCSW Group Therapy Note ? ? ?Group Date: 12/05/2021 ?Start Time: 1300 ?End Time: 1400 ? ? ?Type of Therapy/Topic:  Group Therapy:  Emotion Regulation ? ?Participation Level:  Did Not Attend  ? ?Mood: ? ?Description of Group:   ? The purpose of this group is to assist patients in learning to regulate negative emotions and experience positive emotions. Patients will be guided to discuss ways in which they have been vulnerable to their negative emotions. These vulnerabilities will be juxtaposed with experiences of positive emotions or situations, and patients challenged to use positive emotions to combat negative ones. Special emphasis will be placed on coping with negative emotions in conflict situations, and patients will process healthy conflict resolution skills. ? ?Therapeutic Goals: ?Patient will identify two positive emotions or experiences to reflect on in order to balance out negative emotions:  ?Patient will label two or more emotions that they find the most difficult to experience:  ?Patient will be able to demonstrate positive conflict resolution skills through discussion or role plays:  ? ?Summary of Patient Progress: ? ? ?X ? ? ? ?Therapeutic Modalities:   ?Cognitive Behavioral Therapy ?Feelings Identification ?Dialectical Behavioral Therapy ? ? ?Abby Tucholski J Bernardina Cacho, LCSW ?

## 2021-12-05 NOTE — [facility] Suicide Risk Assessment (Signed)
[facility] INPATIENT:  Family/Significant Other Suicide Prevention Education ? ?Suicide Prevention Education:  ?Patient Refusal for Family/Significant Other Suicide Prevention Education: The patient [name] [name] has refused to provide written consent for family/significant other to be provided Family/Significant Other Suicide Prevention Education during admission and/or prior to discharge.  Physician notified. ? ?CSW completed SPE with patient. Discussed potential triggers leading to suicidal ideation in addition to coping skills one might use in order to delay and distract self from self harming behaviors. CSW encouraged patient to utilize emergency services if they felt unable to maintain their safety. SPE flyer provided to patient at this time.   ? ?Corky Crafts ?12/05/2021, 3:34 PM ? ?

## 2021-12-05 NOTE — [facility] Counselor (Signed)
Adult Comprehensive Assessment ? ?Patient ID: [name] [name], male   DOB: 24-Jun-1996, 24 y.o.   MRN: [id number] ? ?Information Source: ?Information source: Patient ? ?Current Stressors:  ?Patient states their primary concerns and needs for treatment are:: States "I felt a bit of anxiety, not at all psychotic . Marland Kitchen Marland Kitchen anxious about the way people see me." ?Patient states their goals for this hospitilization and ongoing recovery are:: States his goal for treatment is to "being able to leave <discharge>" ?Educational / Learning stressors: none reported ?Employment / Job issues: none reported ?Family Relationships: none reported ?Financial / Lack of resources (include bankruptcy): none reported ?Housing / Lack of housing: none reported ?Physical health (include injuries & life threatening diseases): none reported ?Social relationships: States he feels isolated which "slightly bother<s>" him ?Substance abuse: Endorses active cannabis use. ?Bereavement / Loss: none reported ? ?Living/Environment/Situation:  ?Living Arrangements: Parent, Other relatives ?Living conditions (as described by patient or guardian): States conditions are WNL/ ?Who else lives in the home?: Patient lives with mother and brother ?How long has patient lived in current situation?: States he has lived at residence for "most of my live"; reports brief stint living with friend. ?What is atmosphere in current home: Comfortable, Supportive ? ?Family History:  ?Marital status: Single ?Are you sexually active?: No ?What is your sexual orientation?: Heterosexual ?Has your sexual activity been affected by drugs, alcohol, medication, or emotional stress?: No ?Does patient have children?: No ? ?Childhood History:  ?By whom was/is the patient raised?: Mother ?Additional childhood history information: All of pt's family lives in [place], [place].  It has just been him, his mother, and younger brother living in the [place]; reports father left at age 16. ?Description of  patient's relationship with caregiver when they were a child: Describes his relationship with his mother as a child as "safe and fun . . . she gave me everything I needed . . . too much coddling and I was spoiled." ?Patient's description of current relationship with people who raised him/her: Describes his current relationship with his mother as "well" ?How were you disciplined when you got in trouble as a child/adolescent?: Patient reports he was not disciplined as a child. ?Does patient have siblings?: Yes ?Number of Siblings: 1 ?Description of patient's current relationship with siblings: Describes his relationship with his brother as "well." ?Did patient suffer any verbal/emotional/physical/sexual abuse as a child?: No ?Did patient suffer from severe childhood neglect?: No ?Has patient ever been sexually abused/assaulted/raped as an adolescent or adult?: No ?Was the patient ever a victim of a crime or a disaster?: No ?Witnessed domestic violence?: No ?Has patient been affected by domestic violence as an adult?: No ? ?Education:  ?Highest grade of school patient has completed: 10th grade ?Currently a student?: No ?Learning disability?: No ? ?Employment/Work Situation:   ?Employment Situation: Unemployed (1 year) ?What is the Longest Time Patient has Held a Job?: 1 year ?Where was the Patient Employed at that Time?: [employer] ?Has Patient ever Been in the Military?: No ? ?Financial Resources:   ?Financial resources: No income, Medicaid ?Does patient have a representative payee or guardian?: No (Patient denies) ? ?Alcohol/Substance Abuse:   ?What has been your use of drugs/alcohol within the last 12 months?: Patient reports active cannabis use, roughly 1x monthly based on availability. ?If attempted suicide, did drugs/alcohol play a role in this?:  (n/a) ?Alcohol/Substance Abuse Treatment Hx: Denies past history ?Has alcohol/substance abuse ever caused legal problems?: No ? ?Social Support System:    ?  Patient's Community Support System: Fair ?Describe Community Support System: Lists his mother as supportive of his mental health and wellness ?Type of faith/religion: none reported ?How does patient's faith help to cope with current illness?: n/a ? ?Leisure/Recreation:   ?Do You Have Hobbies?: Yes ?Leisure and Hobbies: Reports he enjoys listening to music ? ?Strengths/Needs:   ?Patient states these barriers may affect/interfere with their treatment: none reported ?Patient states these barriers may affect their return to the community: none reported ?Other important information patient would like considered in planning for their treatment: none reported ? ?Discharge Plan:   ?Currently receiving community mental health services: Yes (From Whom) [name]) ?Patient states they will know when they are safe and ready for discharge when: Patient believes he is ready for discharge at this time. ?Does patient have access to transportation?: Yes ?Does patient have financial barriers related to discharge medications?: No ([place] Medicaid) ?Will patient be returning to same living situation after discharge?: Yes ? ?Summary/Recommendations:   ?Summary and Recommendations (to be completed by the evaluator): 26 y/o male w/ dx of Schizophrenia, bipolar type from [place] w/  Medicaid admitted due to aggression towards family. During assesment, patient states he was being hospitalized due to a misunderstanding, people were misinterpreting his behaviors as psychotic while he states he is simply feeling anxious. Patient appears to be minimizing symptoms during conversation, appears to be thought blocking. Expresses concern over "how people are viewing him" and nothing else. Currently recieves outpatient mental health services with [name] [name]; has been med non-compliant for the past three days. Further states the ACT team visits him too often. Theraputic recomendations include crisis stabilization, medication  management, group therapy, and case management. ? ?[name] Crafts. 12/05/2021 ?

## 2021-12-05 NOTE — Progress Notes (Signed)
Pt isolated to room, out of room to eat snack and for medication. Pt denies SI/HI and AVH. He denies having anxiety and depression. Pt responding to internal stimuli, suspicious and paranoid. Pt has a fixed stare, he pushed his bed against the door and moved the furniture around in his room. He has been resistant to taking his medication. He hesitated a lot when you take to him, responding to internal stimuli. He was instructed we have a no touch rules regarding all patient and no one can enter anyone's room. ?  ?

## 2021-12-06 DIAGNOSIS — F203 Undifferentiated schizophrenia: Secondary | ICD-10-CM | POA: Diagnosis not present

## 2021-12-06 LAB — CBC WITH DIFFERENTIAL/PLATELET
Abs Immature Granulocytes: 0.02 10*3/uL (ref 0.00–0.07)
Basophils Absolute: 0 10*3/uL (ref 0.0–0.1)
Basophils Relative: 0 %
Eosinophils Absolute: 0 10*3/uL (ref 0.0–0.5)
Eosinophils Relative: 0 %
HCT: 44.7 % (ref 39.0–52.0)
Hemoglobin: 15.5 g/dL (ref 13.0–17.0)
Immature Granulocytes: 0 %
Lymphocytes Relative: 36 %
Lymphs Abs: 2.2 10*3/uL (ref 0.7–4.0)
MCH: 28.3 pg (ref 26.0–34.0)
MCHC: 34.7 g/dL (ref 30.0–36.0)
MCV: 81.7 fL (ref 80.0–100.0)
Monocytes Absolute: 0.6 10*3/uL (ref 0.1–1.0)
Monocytes Relative: 10 %
Neutro Abs: 3.2 10*3/uL (ref 1.7–7.7)
Neutrophils Relative %: 54 %
Platelets: 268 10*3/uL (ref 150–400)
RBC: 5.47 MIL/uL (ref 4.22–5.81)
RDW: 12.2 % (ref 11.5–15.5)
WBC: 6 10*3/uL (ref 4.0–10.5)
nRBC: 0 % (ref 0.0–0.2)

## 2021-12-06 LAB — LIPID PANEL
Cholesterol: 197 mg/dL (ref 0–200)
HDL: 46 mg/dL (ref >40)
LDL Cholesterol: 129 mg/dL — ABNORMAL HIGH (ref 0–99)
Total CHOL/HDL Ratio: 4.3 RATIO
Triglycerides: 111 mg/dL (ref <150)
VLDL: 22 mg/dL (ref 0–40)

## 2021-12-06 NOTE — Plan of Care (Signed)
D- Patient alert and oriented. Patient presented to be very suspicious, paranoid on assessment this morning, with some apparent thought blocking. It took patient a few seconds to respond to this Clinical research associate when asked if he would come to get his morning medication. Patient forwards very little and only gives off "yes or no" answers, he does not elaborate on anything. Patient denied SI, HI, AVH, and pain at this time. Patient also denied any signs/symptoms of depression/anxiety, stating that overall he is "feeling good". Patient also looked at his medication, in the cup, for a few seconds, before taking it and walking off back to his room. Patient had no stated goals for today.  A- Scheduled medications administered to patient, per MD orders. Support and encouragement provided.  Routine safety checks conducted every 15 minutes.  Patient informed to notify staff with problems or concerns.  R- No adverse drug reactions noted. Patient contracts for safety at this time. Patient compliant with medications and treatment plan. Patient receptive, calm, and cooperative. Patient remains safe at this time.  Problem: Education: Goal: Knowledge of  General Education information/materials will improve Outcome: Not Progressing Goal: Emotional status will improve Outcome: Not Progressing Goal: Mental status will improve Outcome: Not Progressing Goal: Verbalization of understanding the information provided will improve Outcome: Not Progressing   Problem: Health Behavior/Discharge Planning: Goal: Compliance with treatment plan for underlying cause of condition will improve Outcome: Not Progressing   Problem: Safety: Goal: Periods of time without injury will increase Outcome: Not Progressing   Problem: Self-Concept: Goal: Ability to identify factors that promote anxiety will improve Outcome: Not Progressing Goal: Level of anxiety will decrease Outcome: Not Progressing   Problem: Education: Goal:  Will be free of psychotic symptoms Outcome: Not Progressing   Problem: Health Behavior/Discharge Planning: Goal: Compliance with prescribed medication regimen will improve Outcome: Not Progressing

## 2021-12-06 NOTE — Progress Notes (Signed)
Patient just came up to nurses station asking for his dinner tray. Once again he is asking to eat his food, sitting in the chair outside of the nurses station. Patient's tray was given to him and he is now eating, without any issues. Patient remains safe on the unit.

## 2021-12-06 NOTE — Progress Notes (Signed)
Pt awake early this am and quietly walking in the halls.

## 2021-12-06 NOTE — Group Note (Signed)
[facility] LCSW Group Therapy Note   Group Date: 12/06/2021 Start Time: 1300 End Time: 1400   Type of Therapy/Topic:  Group Therapy:  Balance in Life  Participation Level:  Did Not Attend   Description of Group:    This group will address the concept of balance and how it feels and looks when one is unbalanced. Patients will be encouraged to process areas in their lives that are out of balance, and identify reasons for remaining unbalanced. Facilitators will guide patients utilizing problem- solving interventions to address and correct the stressor making their life unbalanced. Understanding and applying boundaries will be explored and addressed for obtaining  and maintaining a balanced life. Patients will be encouraged to explore ways to assertively make their unbalanced needs known to significant others in their lives, using other group members and facilitator for support and feedback.  Therapeutic Goals: Patient will identify two or more emotions or situations they have that consume much of in their lives. Patient will identify signs/triggers that life has become out of balance:  Patient will identify two ways to set boundaries in order to achieve balance in their lives:  Patient will demonstrate ability to communicate their needs through discussion and/or role plays  Summary of Patient Progress: Patient did not attend group despite encouraged participation.     Therapeutic Modalities:   Cognitive Behavioral Therapy Solution-Focused Therapy Assertiveness Training   Ranell Finelli W Rowen Wilmer, LCSWA 

## 2021-12-06 NOTE — Progress Notes (Signed)
This Clinical research associate was informed that patient continues to refuse labs.

## 2021-12-06 NOTE — Progress Notes (Signed)
[facility] MD Progress Note  12/06/2021 2:41 PM [name] [name]  MRN:  [id number] Subjective: Patient seen and chart reviewed.  Patient continues to stay in his room all the time usually with the blankets pulled over his head.  For part of the day he had barricaded himself in the room with a chair although it was not difficult to push it out of the way.  Continues to have a blank stare and little communication but when asked about symptoms will deny them.  He also told me that he would agree to have labs done but has been refusing them repeatedly which is certainly standing in the way of care.  It does appear that he took his medicine last night. Principal Problem: Schizophrenia (HCC) Diagnosis: Principal Problem:   Schizophrenia (HCC) Active Problems:   Hypertension  Total Time spent with patient: 30 minutes  Past Psychiatric History: Past history of schizophrenia or schizoaffective disorder with history of repeated noncompliance outside the hospital  Past Medical History:  Past Medical History:  Diagnosis Date   Asthma    Depression    Psychosis (HCC)    Schizoaffective disorder (HCC)     Past Surgical History:  Procedure Laterality Date   BACK SURGERY     I&D groin  2017   Family History: History reviewed. No pertinent family history. Family Psychiatric  History: See previous Social History:  Social History   Substance and Sexual Activity  Alcohol Use No     Social History   Substance and Sexual Activity  Drug Use Never    Social History   Socioeconomic History   Marital status: Single    Spouse name: Not on file   Number of children: Not on file   Years of education: Not on file   Highest education level: Not on file  Occupational History   Not on file  Tobacco Use   Smoking status: Never   Smokeless tobacco: Never  Vaping Use   Vaping Use: Some days  Substance and Sexual Activity   Alcohol use: No   Drug use: Never   Sexual activity: Not Currently  Other  Topics Concern   Not on file  Social History Narrative   Not on file   Social Determinants of Health   Financial Resource Strain: Not on file  Food Insecurity: Not on file  Transportation Needs: Not on file  Physical Activity: Not on file  Stress: Not on file  Social Connections: Not on file   Additional Social History:                         Sleep: Fair  Appetite:  Fair  Current Medications: Current Facility-Administered Medications  Medication Dose Route Frequency Provider Last Rate Last Admin   acetaminophen (TYLENOL) tablet 650 mg  650 mg Oral Q6H PRN Vanetta Mulders, NP       alum & mag hydroxide-simeth (MAALOX/MYLANTA) 200-200-20 MG/5ML suspension 30 mL  30 mL Oral Q4H PRN Gabriel Cirri F, NP       cloZAPine (CLOZARIL) tablet 50 mg  50 mg Oral QHS Kinaya Hilliker T, MD   50 mg at 12/05/21 2110   divalproex (DEPAKOTE) DR tablet 500 mg  500 mg Oral Q12H Tamella Tuccillo T, MD   500 mg at 12/06/21 0805   magnesium hydroxide (MILK OF MAGNESIA) suspension 30 mL  30 mL Oral Daily PRN Vanetta Mulders, NP        Lab Results:  No results found for this or any previous visit (from the past 48 hour(s)).  Blood Alcohol level:  Lab Results  Component Value Date   ETH <10 12/03/2021   ETH <10 07/18/2020    Metabolic Disorder Labs: Lab Results  Component Value Date   HGBA1C 5.1 12/03/2021   MPG 99.67 12/03/2021   MPG 91.06 07/19/2020   No results found for: PROLACTIN Lab Results  Component Value Date   CHOL 256 (H) 07/19/2020   TRIG 137 07/19/2020   HDL 52 07/19/2020   CHOLHDL 4.9 07/19/2020   VLDL 27 07/19/2020   LDLCALC 177 (H) 07/19/2020   LDLCALC 95 06/18/2020    Physical Findings: AIMS:  , ,  ,  ,    CIWA:    COWS:     Musculoskeletal: Strength & Muscle Tone: within normal limits Gait & Station: normal Patient leans: N/A  Psychiatric Specialty Exam:  Presentation  General Appearance: Casual; Well Groomed  Eye  Contact:Good  Speech:Slow  Speech Volume:Decreased  Handedness:Right   Mood and Affect  Mood:Anxious  Affect:Inappropriate; Constricted   Thought Process  Thought Processes:Disorganized  Descriptions of Associations:Circumstantial  Orientation:Full (Time, Place and Person)  Thought Content:Paranoid Ideation; Scattered; Illogical  History of Schizophrenia/Schizoaffective disorder:Yes  Duration of Psychotic Symptoms:Greater than six months  Hallucinations:No data recorded Ideas of Reference:Paranoia; Delusions  Suicidal Thoughts:No data recorded Homicidal Thoughts:No data recorded  Sensorium  Memory:Immediate Poor; Recent Poor; Remote Poor  Judgment:Poor  Insight:Poor   Executive Functions  Concentration:Fair  Attention Span:Poor  Recall:Poor  Fund of Knowledge:Poor  Language:Poor   Psychomotor Activity  Psychomotor Activity:No data recorded  Assets  Assets:Communication Skills; Desire for Improvement; Resilience; Social Support   Sleep  Sleep:No data recorded   Physical Exam: Physical Exam Vitals and nursing note reviewed.  Constitutional:      Appearance: Normal appearance.  HENT:     Head: Normocephalic and atraumatic.     Mouth/Throat:     Pharynx: Oropharynx is clear.  Eyes:     Pupils: Pupils are equal, round, and reactive to light.  Cardiovascular:     Rate and Rhythm: Normal rate and regular rhythm.  Pulmonary:     Effort: Pulmonary effort is normal.     Breath sounds: Normal breath sounds.  Abdominal:     General: Abdomen is flat.     Palpations: Abdomen is soft.  Musculoskeletal:        General: Normal range of motion.  Skin:    General: Skin is warm and dry.  Neurological:     General: No focal deficit present.     Mental Status: He is alert. Mental status is at baseline.  Psychiatric:        Attention and Perception: He is inattentive.        Mood and Affect: Mood normal. Affect is blunt.        Speech: Speech is  delayed.        Behavior: Behavior is slowed.   Review of Systems  Constitutional: Negative.   HENT: Negative.    Eyes: Negative.   Respiratory: Negative.    Cardiovascular: Negative.   Gastrointestinal: Negative.   Musculoskeletal: Negative.   Skin: Negative.   Neurological: Negative.   Psychiatric/Behavioral: Negative.    Blood pressure 109/80, pulse 69, temperature 98.6 F (37 C), temperature source Oral, resp. rate 18, height 6\' 1"  (1.854 m), weight 103.4 kg, SpO2 100 %. Body mass index is 30.08 kg/m.   Treatment Plan Summary: Plan continue Clozapine and  after today we can start titrating the dose up.  I ask him if he would get his labs done and he agreed so I told him that if it needs to be I will stand over him while the lab person draws it.  No other change to medicine today.  As always encouraged him to get out of his room  Mordecai RasmussenJohn Kiosha Buchan, MD 12/06/2021, 2:41 PM

## 2021-12-06 NOTE — Progress Notes (Signed)
Patient did allow lab to draw his blood.

## 2021-12-06 NOTE — Progress Notes (Signed)
Recreation Therapy Notes  INPATIENT RECREATION TR PLAN  Patient Details Name: [name] [name] MRN: [id number] DOB: 1995-12-21 Today's Date: 12/06/2021  Rec Therapy Plan Is patient appropriate for Therapeutic Recreation?: Yes Treatment times per week: at least 3 Estimated Length of Stay: 5-7 days TR Treatment/Interventions: Group participation (Comment)  Discharge Criteria Pt will be discharged from therapy if:: Discharged Treatment plan/goals/alternatives discussed and agreed upon by:: Patient/family  Discharge Summary     Raymie Giammarco 12/06/2021, 3:54 PM

## 2021-12-06 NOTE — Progress Notes (Signed)
Recreation Therapy Notes  Date: 12/06/2021   Time: 10:40 am     Location: Court yard    Behavioral response: N/A   Intervention Topic: Values    Discussion/Intervention: Patient refused to attend group.    Clinical Observations/Feedback:  Patient refused to attend group.    Niyam Bisping LRT/CTRS        Derwood Becraft 12/06/2021 11:09 AM

## 2021-12-06 NOTE — Progress Notes (Signed)
Patient is awake and has started pacing the halls. This Clinical research associate went out to ask him if he wanted to eat his lunch and he asked this Clinical research associate to bring his tray to him. This Clinical research associate explained to patient that he would have to eat down in the dayroom. Patient verbalized understanding and then stated that he didn't want to eat. Patient asked this writer for soap and when I asked patient if he wanted the bar or liquid soap, he changed his mind and stated "I don't need anything".

## 2021-12-06 NOTE — Progress Notes (Signed)
Patient just stated to this writer "I want to eat, but my mind is messing with me too much". So, patient asked if he could eat his lunch tray, sitting in the chair outside of the nurses station, instead of down in the dayroom. This writer went to get patient's tray and he is now eating.

## 2021-12-07 DIAGNOSIS — F203 Undifferentiated schizophrenia: Secondary | ICD-10-CM | POA: Diagnosis not present

## 2021-12-07 MED ORDER — LORAZEPAM 2 MG PO TABS
2.0000 mg | ORAL_TABLET | Freq: Four times a day (QID) | ORAL | Status: DC | PRN
Start: 2021-12-07 — End: 2021-12-19
  Administered 2021-12-14 – 2021-12-19 (×3): 2 mg via ORAL
  Filled 2021-12-07 (×3): qty 1

## 2021-12-07 MED ORDER — LORAZEPAM 2 MG/ML IJ SOLN: 2.0000 mg | Freq: Four times a day (QID) | INTRAMUSCULAR | Status: DC | PRN

## 2021-12-07 MED ORDER — CLOZAPINE 100 MG PO TABS
100.0000 mg | ORAL_TABLET | Freq: Every day | ORAL | Status: DC
  Administered 2021-12-07 – 2021-12-10 (×4): 100 mg via ORAL
  Filled 2021-12-07 (×5): qty 1

## 2021-12-07 NOTE — Plan of Care (Signed)
D- Patient alert and oriented. Patient presented in a suspicious, but pleasant mood on assessment stating that he slept "really good" last night and had no complaints to voice to this Clinical research associate. Patient forwards little when interacting with this Clinical research associate. Patient denied SI, HI, AVH, and pain at this time. Patient also denied any signs/symptoms of depression and anxiety, stating that overall, he is feeling "good". Patient had no stated goals for today.  A- Scheduled medications administered to patient, per MD orders. Support and encouragement provided.  Routine safety checks conducted every 15 minutes.  Patient informed to notify staff with problems or concerns.  R- No adverse drug reactions noted. Patient contracts for safety at this time. Patient compliant with medications. Patient receptive, calm, and cooperative. Patient remains safe at this time.  Problem: Education: Goal: Knowledge of [place] [facility] Education information/materials will improve Outcome: Not Progressing Goal: Emotional status will improve Outcome: Not Progressing Goal: Mental status will improve Outcome: Not Progressing Goal: Verbalization of understanding the information provided will improve Outcome: Not Progressing   Problem: Health Behavior/Discharge Planning: Goal: Compliance with treatment plan for underlying cause of condition will improve Outcome: Not Progressing   Problem: Safety: Goal: Periods of time without injury will increase Outcome: Not Progressing   Problem: Self-Concept: Goal: Ability to identify factors that promote anxiety will improve Outcome: Not Progressing Goal: Level of anxiety will decrease Outcome: Not Progressing   Problem: Education: Goal: Will be free of psychotic symptoms Outcome: Not Progressing   Problem: Health Behavior/Discharge Planning: Goal: Compliance with prescribed medication regimen will improve Outcome: Not Progressing

## 2021-12-07 NOTE — Progress Notes (Signed)
Pt presents with paranoia and pacing the hallway. Pt denies SI/HI/AVH. Pt compliant with medication administration per MD orders. Pt give education and encouragement to be active in his treatment plan. Pt being monitored Q 15 minutes for safety per unit protocol. Pt remains safe on the unit.  

## 2021-12-07 NOTE — Progress Notes (Signed)
Patient has started pacing the hallways and trying to move chairs around the unit. This Clinical research associate notified MD of these changes and new PRN orders were placed.

## 2021-12-07 NOTE — Progress Notes (Signed)
Pt presents with paranoia and stated to this writer that he was feeling paranoid tonight. Pt given support. Pt compliant with medication administration per MD orders. Pt paced his hall for a while and then went to sleep. Pt give education and encouragement to be active in his treatment plan. Pt being monitored Q 15 minutes for safety per unit protocol. Pt remains safe on the unit.

## 2021-12-07 NOTE — Progress Notes (Signed)
This Clinical research associate noticed that patient had gotten up from the chair, outside of the nurses station, and left his lunch tray. This writer went to retrieve the tray and noticed that the plate was not there. This writer went to patient's room and asked for the plate. Patient was tryiong to keep his plate, with food still on it, in his room because "I get hungry later". This Clinical research associate explained to patient that he had another meal coming, as well as snack on the next shift. Patient gave this writer the plate and asked if his tray could be saved. This Clinical research associate told patient that his tray would be saved for a while. Patient verbalized understanding and also gave this writer a bag of trash that he had in his room, from previous meal/snack times. This Clinical research associate informed patient that he was not to bring any food into his room so that this wouldn't attract ants, in which he also verbalized understanding. Patient remains safe on the unit.

## 2021-12-07 NOTE — Progress Notes (Signed)
Patient was encouraged by [name] [name], MHT to come down to the dayroom to get his dinner tray. Patient was extremely hesitant at first, but after a few minutes, he came down to get his tray and ate with the rest of the members on the unit.

## 2021-12-07 NOTE — Group Note (Signed)
[facility] LCSW Group Therapy Note   Group Date: 12/07/2021 Start Time: 1300 End Time: 1400  Type of Therapy and Topic:  Group Therapy:  Feelings around Relapse and Recovery  Participation Level:  Did Not Attend   Mood:  Description of Group:    Patients in this group will discuss emotions they experience before and after a relapse. They will process how experiencing these feelings, or avoidance of experiencing them, relates to having a relapse. Facilitator will guide patients to explore emotions they have related to recovery. Patients will be encouraged to process which emotions are more powerful. They will be guided to discuss the emotional reaction significant others in their lives may have to patients' relapse or recovery. Patients will be assisted in exploring ways to respond to the emotions of others without this contributing to a relapse.  Therapeutic Goals: Patient will identify two or more emotions that lead to relapse for them:  Patient will identify two emotions that result when they relapse:  Patient will identify two emotions related to recovery:  Patient will demonstrate ability to communicate their needs through discussion and/or role plays.   Summary of Patient Progress:  Patient did not attend group despite encouraged participation.    Therapeutic Modalities:   Cognitive Behavioral Therapy Solution-Focused Therapy Assertiveness Training Relapse Prevention Therapy   Tywan Siever W Suhail Peloquin, LCSWA 

## 2021-12-07 NOTE — Progress Notes (Signed)
[facility] MD Progress Note  12/07/2021 3:28 PM [name] [name]  MRN:  [id number] Subjective: Follow-up 26 year old man with schizophrenia.  Mostly stays in bed all day.  Appears to be mostly compliant with medicine.  On interview as usual he either says yes sir and agrees to everything I say or has no complaints.  Other staff note he remains paranoid saying this morning he was too paranoid to eat in the day room with everyone else. Principal Problem: Schizophrenia (HCC) Diagnosis: Principal Problem:   Schizophrenia (HCC) Active Problems:   Hypertension  Total Time spent with patient: 30 minutes  Past Psychiatric History: Past history of schizophrenia  Past Medical History:  Past Medical History:  Diagnosis Date   Asthma    Depression    Psychosis (HCC)    Schizoaffective disorder (HCC)     Past Surgical History:  Procedure Laterality Date   BACK SURGERY     I&D groin  2017   Family History: History reviewed. No pertinent family history. Family Psychiatric  History: See previous Social History:  Social History   Substance and Sexual Activity  Alcohol Use No     Social History   Substance and Sexual Activity  Drug Use Never    Social History   Socioeconomic History   Marital status: Single    Spouse name: Not on file   Number of children: Not on file   Years of education: Not on file   Highest education level: Not on file  Occupational History   Not on file  Tobacco Use   Smoking status: Never   Smokeless tobacco: Never  Vaping Use   Vaping Use: Some days  Substance and Sexual Activity   Alcohol use: No   Drug use: Never   Sexual activity: Not Currently  Other Topics Concern   Not on file  Social History Narrative   Not on file   Social Determinants of Health   Financial Resource Strain: Not on file  Food Insecurity: Not on file  Transportation Needs: Not on file  Physical Activity: Not on file  Stress: Not on file  Social Connections: Not on file    Additional Social History:                         Sleep: Fair  Appetite:  Negative  Current Medications: Current Facility-Administered Medications  Medication Dose Route Frequency Provider Last Rate Last Admin   acetaminophen (TYLENOL) tablet 650 mg  650 mg Oral Q6H PRN Vanetta Mulders, NP       alum & mag hydroxide-simeth (MAALOX/MYLANTA) 200-200-20 MG/5ML suspension 30 mL  30 mL Oral Q4H PRN Gabriel Cirri F, NP       cloZAPine (CLOZARIL) tablet 50 mg  50 mg Oral QHS Cecil Vandyke T, MD   50 mg at 12/06/21 2109   divalproex (DEPAKOTE) DR tablet 500 mg  500 mg Oral Q12H Tonianne Fine T, MD   500 mg at 12/07/21 5638   magnesium hydroxide (MILK OF MAGNESIA) suspension 30 mL  30 mL Oral Daily PRN Vanetta Mulders, NP        Lab Results:  Results for orders placed or performed during the hospital encounter of 12/04/21 (from the past 48 hour(s))  CBC with Differential/Platelet     Status: None   Collection Time: 12/06/21  3:02 PM  Result Value Ref Range   WBC 6.0 4.0 - 10.5 K/uL   RBC 5.47 4.22 - 5.81 MIL/uL  Hemoglobin 15.5 13.0 - 17.0 g/dL   HCT 16.144.7 09.639.0 - 04.552.0 %   MCV 81.7 80.0 - 100.0 fL   MCH 28.3 26.0 - 34.0 pg   MCHC 34.7 30.0 - 36.0 g/dL   RDW 40.912.2 81.111.5 - 91.415.5 %   Platelets 268 150 - 400 K/uL   nRBC 0.0 0.0 - 0.2 %   Neutrophils Relative % 54 %   Neutro Abs 3.2 1.7 - 7.7 K/uL   Lymphocytes Relative 36 %   Lymphs Abs 2.2 0.7 - 4.0 K/uL   Monocytes Relative 10 %   Monocytes Absolute 0.6 0.1 - 1.0 K/uL   Eosinophils Relative 0 %   Eosinophils Absolute 0.0 0.0 - 0.5 K/uL   Basophils Relative 0 %   Basophils Absolute 0.0 0.0 - 0.1 K/uL   Immature Granulocytes 0 %   Abs Immature Granulocytes 0.02 0.00 - 0.07 K/uL    Comment: Performed at [facility], [address] [place], [place] [address]  Lipid panel     Status: Abnormal   Collection Time: 12/06/21  3:02 PM  Result Value Ref Range   Cholesterol 197 0 - 200 mg/dL   Triglycerides  621111 <308<150 mg/dL   HDL 46 >65>40 mg/dL   Total CHOL/HDL Ratio 4.3 RATIO   VLDL 22 0 - 40 mg/dL   LDL Cholesterol 784129 (H) 0 - 99 mg/dL    Comment:        Total Cholesterol/HDL:CHD Risk Coronary Heart Disease Risk Table                     Men   Women  1/2 Average Risk   3.4   3.3  Average Risk       5.0   4.4  2 X Average Risk   9.6   7.1  3 X Average Risk  23.4   11.0        Use the calculated Patient Ratio above and the CHD Risk Table to determine the patient's CHD Risk.        ATP III CLASSIFICATION (LDL):  <100     mg/dL   Optimal  696-295100-129  mg/dL   Near or Above                    Optimal  130-159  mg/dL   Borderline  284-132160-189  mg/dL   High  >440>190     mg/dL   Very High Performed at [facility], [address] [place], [place] [address]     Blood Alcohol level:  Lab Results  Component Value Date   [facility]   ETH <10 07/18/2020    Metabolic Disorder Labs: Lab Results  Component Value Date   HGBA1C 5.1 12/03/2021   MPG 99.67 12/03/2021   MPG 91.06 07/19/2020   No results found for: PROLACTIN Lab Results  Component Value Date   CHOL 197 12/06/2021   TRIG 111 12/06/2021   HDL 46 12/06/2021   CHOLHDL 4.3 12/06/2021   VLDL 22 12/06/2021   LDLCALC 129 (H) 12/06/2021   LDLCALC 177 (H) 07/19/2020    Physical Findings: AIMS:  , ,  ,  ,    CIWA:    COWS:     Musculoskeletal: Strength & Muscle Tone: within normal limits Gait & Station: normal Patient leans: N/A  Psychiatric Specialty Exam:  Presentation  General Appearance: Casual; Well Groomed  Eye Contact:Good  Speech:Slow  Speech Volume:Decreased  Handedness:Right  Mood and Affect  Mood:Anxious  Affect:Inappropriate; Constricted   Thought Process  Thought Processes:Disorganized  Descriptions of Associations:Circumstantial  Orientation:Full (Time, Place and Person)  Thought Content:Paranoid Ideation; Scattered; Illogical  History of Schizophrenia/Schizoaffective  disorder:Yes  Duration of Psychotic Symptoms:Greater than six months  Hallucinations:No data recorded Ideas of Reference:Paranoia; Delusions  Suicidal Thoughts:No data recorded Homicidal Thoughts:No data recorded  Sensorium  Memory:Immediate Poor; Recent Poor; Remote Poor  Judgment:Poor  Insight:Poor   Executive Functions  Concentration:Fair  Attention Span:Poor  Recall:Poor  Fund of Knowledge:Poor  Language:Poor   Psychomotor Activity  Psychomotor Activity:No data recorded  Assets  Assets:Communication Skills; Desire for Improvement; Resilience; Social Support   Sleep  Sleep:No data recorded   Physical Exam: Physical Exam Vitals and nursing note reviewed.  Constitutional:      Appearance: Normal appearance.  HENT:     Head: Normocephalic and atraumatic.     Mouth/Throat:     Pharynx: Oropharynx is clear.  Eyes:     Pupils: Pupils are equal, round, and reactive to light.  Cardiovascular:     Rate and Rhythm: Normal rate and regular rhythm.  Pulmonary:     Effort: Pulmonary effort is normal.     Breath sounds: Normal breath sounds.  Abdominal:     General: Abdomen is flat.     Palpations: Abdomen is soft.  Musculoskeletal:        General: Normal range of motion.  Skin:    General: Skin is warm and dry.  Neurological:     General: No focal deficit present.     Mental Status: He is alert. Mental status is at baseline.  Psychiatric:        Attention and Perception: He is inattentive.        Mood and Affect: Mood normal. Affect is blunt.        Speech: He is noncommunicative.        Behavior: Behavior is slowed.        Thought Content: Thought content is paranoid.   Review of Systems  Constitutional: Negative.   HENT: Negative.    Eyes: Negative.   Respiratory: Negative.    Cardiovascular: Negative.   Gastrointestinal: Negative.   Musculoskeletal: Negative.   Skin: Negative.   Neurological: Negative.   Psychiatric/Behavioral: Negative.     Blood pressure 109/80, pulse 69, temperature 98.6 F (37 C), temperature source Oral, resp. rate 18, height 6\' 1"  (1.854 m), weight 103.4 kg, SpO2 100 %. Body mass index is 30.08 kg/m.   Treatment Plan Summary: Medication management and Plan spoke with the patient and really emphasized how important it was to take his medicine.  We will be increasing the clozapine dose trying to get back to his previous level.  Staff aware that he minimizes symptoms.  , MD 12/07/2021, 3:28 PM

## 2021-12-07 NOTE — Progress Notes (Signed)
Patient continues to ask for his meal trays at the chair, outside of the nurses station. Patient reports that he is "too paranoid" to go down to the dayroom. Patient ate his breakfast and filled out his menu, without any issues. Patient remains safe on the unit.

## 2021-12-08 DIAGNOSIS — F203 Undifferentiated schizophrenia: Secondary | ICD-10-CM | POA: Diagnosis not present

## 2021-12-08 NOTE — Progress Notes (Signed)
Pt has mostly been isolative to his room. I gave him hygiene, products, bed linen and scrubs. He told me that he took a shower but he did not. He has been med compliant, calm and cooperative. Torrie Mayers RN

## 2021-12-08 NOTE — Plan of Care (Signed)
Pt denies depression, anxiety, SI, HI and AVH. Pt was educated on care plan and verbalizes understanding. Torrie Mayers RN Problem: Education: Goal: Knowledge of [place] [facility] Education information/materials will improve Outcome: Progressing Goal: Emotional status will improve Outcome: Progressing Goal: Mental status will improve Outcome: Progressing Goal: Verbalization of understanding the information provided will improve Outcome: Progressing   Problem: Health Behavior/Discharge Planning: Goal: Compliance with treatment plan for underlying cause of condition will improve Outcome: Progressing   Problem: Safety: Goal: Periods of time without injury will increase Outcome: Progressing   Problem: Self-Concept: Goal: Ability to identify factors that promote anxiety will improve Outcome: Progressing Goal: Level of anxiety will decrease Outcome: Progressing   Problem: Education: Goal: Will be free of psychotic symptoms Outcome: Progressing   Problem: Health Behavior/Discharge Planning: Goal: Compliance with prescribed medication regimen will improve Outcome: Progressing

## 2021-12-08 NOTE — Progress Notes (Signed)
[facility]  12/08/2021 2:01 PM [name] [name]  MRN:  [id number] Subjective: Follow-up 26 year old man with schizophrenia.  Pt seen and chart reviewed.  [name] [name] is still quite withdrawn, with minimal interaction. [name] [name] answered my questions with one or two words.   [name] [name] takes meds, without complaints.   Denied Si or HI, no AVH, no paranoid.   Principal Problem: Schizophrenia (HCC) Diagnosis: Principal Problem:   Schizophrenia (HCC) Active Problems:   Hypertension  Total Time spent with patient: 30 minutes  Past Psychiatric History: Past history of schizophrenia  Past Medical History:  Past Medical History:  Diagnosis Date   Asthma    Depression    Psychosis (HCC)    Schizoaffective disorder (HCC)     Past Surgical History:  Procedure Laterality Date   BACK SURGERY     I&D groin  2017   Family History: History reviewed. No pertinent family history. Family Psychiatric  History: See previous Social History:  Social History   Substance and Sexual Activity  Alcohol Use No     Social History   Substance and Sexual Activity  Drug Use Never    Social History   Socioeconomic History   Marital status: Single    Spouse name: Not on file   Number of children: Not on file   Years of education: Not on file   Highest education level: Not on file  Occupational History   Not on file  Tobacco Use   Smoking status: Never   Smokeless tobacco: Never  Vaping Use   Vaping Use: Some days  Substance and Sexual Activity   Alcohol use: No   Drug use: Never   Sexual activity: Not Currently  Other Topics Concern   Not on file  Social History Narrative   Not on file   Social Determinants of Health   Financial Resource Strain: Not on file  Food Insecurity: Not on file  Transportation Needs: Not on file  Physical Activity: Not on file  Stress: Not on file  Social Connections: Not on file   Additional Social History:    Sleep: Fair  Appetite:  Negative  Current  Medications: Current Facility-Administered Medications  Medication Dose Route Frequency Provider Last Rate Last Admin   acetaminophen (TYLENOL) tablet 650 mg  650 mg Oral Q6H PRN Vanetta MuldersBarthold, Louise F, NP       alum & mag hydroxide-simeth (MAALOX/MYLANTA) 200-200-20 MG/5ML suspension 30 mL  30 mL Oral Q4H PRN Gabriel CirriBarthold, Louise F, NP       cloZAPine (CLOZARIL) tablet 100 mg  100 mg Oral QHS [name], [name], MD   100 mg at 12/07/21 2119   divalproex (DEPAKOTE) DR tablet 500 mg  500 mg Oral Q12H [name], [name], MD   500 mg at 12/08/21 0814   LORazepam (ATIVAN) tablet 2 mg  2 mg Oral Q6H PRN [name], [name], MD       Or   LORazepam (ATIVAN) injection 2 mg  2 mg Intramuscular Q6H PRN [name], [name], MD       magnesium hydroxide (MILK OF MAGNESIA) suspension 30 mL  30 mL Oral Daily PRN Vanetta MuldersBarthold, Louise F, NP        Lab Results:  Results for orders placed or performed during the hospital encounter of 12/04/21 (from the past 48 hour(s))  CBC with Differential/Platelet     Status: None   Collection Time: 12/06/21  3:02 PM  Result Value Ref Range   WBC 6.0 4.0 - 10.5  K/uL   RBC 5.47 4.22 - 5.81 MIL/uL   Hemoglobin 15.5 13.0 - 17.0 g/dL   HCT 61.5 37.9 - 43.2 %   MCV 81.7 80.0 - 100.0 fL   MCH 28.3 26.0 - 34.0 pg   MCHC 34.7 30.0 - 36.0 g/dL   RDW 76.1 47.0 - 92.9 %   Platelets 268 150 - 400 K/uL   nRBC 0.0 0.0 - 0.2 %   Neutrophils Relative % 54 %   Neutro Abs 3.2 1.7 - 7.7 K/uL   Lymphocytes Relative 36 %   Lymphs Abs 2.2 0.7 - 4.0 K/uL   Monocytes Relative 10 %   Monocytes Absolute 0.6 0.1 - 1.0 K/uL   Eosinophils Relative 0 %   Eosinophils Absolute 0.0 0.0 - 0.5 K/uL   Basophils Relative 0 %   Basophils Absolute 0.0 0.0 - 0.1 K/uL   Immature Granulocytes 0 %   Abs Immature Granulocytes 0.02 0.00 - 0.07 K/uL    Comment: Performed at [facility], [address] [place], [place] [address]  Lipid panel     Status: Abnormal   Collection Time: 12/06/21  3:02 PM  Result Value  Ref Range   Cholesterol 197 0 - 200 mg/dL   Triglycerides 403 <709 mg/dL   HDL 46 >64 mg/dL   Total CHOL/HDL Ratio 4.3 RATIO   VLDL 22 0 - 40 mg/dL   LDL Cholesterol 383 (H) 0 - 99 mg/dL    Comment:        Total Cholesterol/HDL:CHD Risk Coronary Heart Disease Risk Table                     Men   Women  1/2 Average Risk   3.4   3.3  Average Risk       5.0   4.4  2 X Average Risk   9.6   7.1  3 X Average Risk  23.4   11.0        Use the calculated Patient Ratio above and the CHD Risk Table to determine the patient's CHD Risk.        ATP III CLASSIFICATION (LDL):  <100     mg/dL   Optimal  818-403  mg/dL   Near or Above                    Optimal  130-159  mg/dL   Borderline  754-360  mg/dL   High  >677     mg/dL   Very High Performed at [facility], [address] [place], [place] [address]     Blood Alcohol level:  Lab Results  Component Value Date   [facility] <10 12/03/2021   ETH <10 07/18/2020    Metabolic Disorder Labs: Lab Results  Component Value Date   HGBA1C 5.1 12/03/2021   MPG 99.67 12/03/2021   MPG 91.06 07/19/2020   No results found for: PROLACTIN Lab Results  Component Value Date   CHOL 197 12/06/2021   TRIG 111 12/06/2021   HDL 46 12/06/2021   CHOLHDL 4.3 12/06/2021   VLDL 22 12/06/2021   LDLCALC 129 (H) 12/06/2021   LDLCALC 177 (H) 07/19/2020    Physical Findings: AIMS:  , ,  ,  ,    CIWA:    COWS:     Musculoskeletal: Strength & Muscle Tone: within normal limits Gait & Station: normal Patient leans: N/A  Psychiatric Specialty Exam:  Presentation  General Appearance: Casual; Well Groomed  Eye Contact:Good  Speech:Slow  Speech Volume:Decreased  Handedness:Right   Mood and Affect  Mood:Anxious  Affect:Inappropriate; Constricted   Thought Process  Thought Processes:Disorganized  Descriptions of Associations:Circumstantial  Orientation:Full (Time, Place and Person)  Thought Content:Paranoid Ideation;  Scattered; Illogical  History of Schizophrenia/Schizoaffective disorder:Yes  Duration of Psychotic Symptoms:Greater than six months  Hallucinations:No data recorded Ideas of Reference:Paranoia; Delusions  Suicidal Thoughts:No data recorded Homicidal Thoughts:No data recorded  Sensorium  Memory:Immediate Poor; Recent Poor; Remote Poor  Judgment:Poor  Insight:Poor   Executive Functions  Concentration:Fair  Attention Span:Poor  Recall:Poor  Fund of Knowledge:Poor  Language:Poor   Psychomotor Activity  Psychomotor Activity:No data recorded  Assets  Assets:Communication Skills; Desire for Improvement; Resilience; Social Support   Sleep  Sleep:No data recorded   Physical Exam: Physical Exam Vitals and nursing note reviewed.  Constitutional:      Appearance: Normal appearance.  HENT:     Head: Normocephalic and atraumatic.     Mouth/Throat:     Pharynx: Oropharynx is clear.  Eyes:     Pupils: Pupils are equal, round, and reactive to light.  Cardiovascular:     Rate and Rhythm: Normal rate and regular rhythm.  Pulmonary:     Effort: Pulmonary effort is normal.     Breath sounds: Normal breath sounds.  Abdominal:     General: Abdomen is flat.     Palpations: Abdomen is soft.  Musculoskeletal:        General: Normal range of motion.  Skin:    General: Skin is warm and dry.  Neurological:     General: No focal deficit present.     Mental Status: Alitzel Cookson is alert. Mental status is at baseline.  Psychiatric:        Attention and Perception: Carley Glendenning is inattentive.        Mood and Affect: Mood normal. Affect is blunt.        Speech: Raevyn Sokol is noncommunicative.        Behavior: Behavior is slowed.        Thought Content: Thought content is paranoid.   Review of Systems  Constitutional: Negative.   HENT: Negative.    Eyes: Negative.   Respiratory: Negative.    Cardiovascular: Negative.   Gastrointestinal: Negative.   Musculoskeletal: Negative.   Skin: Negative.    Neurological: Negative.   Psychiatric/Behavioral: Negative.    Blood pressure 109/80, pulse 69, temperature 98.6 F (37 C), temperature source Oral, resp. rate 18, height 6\' 1"  (1.854 m), weight 103.4 kg, SpO2 100 %. Body mass index is 30.08 kg/m.   Treatment Plan Summary: Daily contact with patient to assess and evaluate symptoms and progress in treatment and Medication management  Schizophrenia, paranoid type -- continue clozapine 100mg  qhs.  -- continue Depakote 500mg  po BID.   Syeda Prickett, MD 12/08/2021, 2:01 PM

## 2021-12-08 NOTE — Progress Notes (Signed)
Pt refused vital signs. Cheila Wickstrom RN 

## 2021-12-08 NOTE — Progress Notes (Signed)
CLOZAPINE MONITORING   Clozapine 100mg  at bedtime ordered.   Check ANC at least weekly while inpatient: next 12/13/21 or sooner    For general population patients, i.e., those without benign ethnic neutropenia (BEN): --If [place] 1000-1499, increase ANC monitoring to 3x/wk --If ANC < 1000, HOLD CLOZAPINE and get psych consult     For patients with BEN: --If [place], increase ANC monitoring to 3x/wk --If ANC < 500, HOLD CLOZAPINE and get psych consult   REMS-certified psychiatry provider can continue drug with [place] below cited thresholds if they document medical opinion that the neutropenia is not clozapine-induced (heme consult is recommended) or that risk of interrupting therapy is greater than the risk of developing severe neutropenia.

## 2021-12-08 NOTE — Progress Notes (Signed)
Pt presents with paranoia and pacing the hallway. Pt denies SI/HI/AVH. Pt compliant with medication administration per MD orders. Pt give education and encouragement to be active in his treatment plan. Pt being monitored Q 15 minutes for safety per unit protocol. Pt remains safe on the unit.

## 2021-12-09 DIAGNOSIS — F203 Undifferentiated schizophrenia: Secondary | ICD-10-CM | POA: Diagnosis not present

## 2021-12-09 NOTE — Progress Notes (Signed)
This writer went to see if patient wanted to go outside to the courtyard and patient stated "I don't know, I'll decide later". Patient continues to lay in bed, and remains safe on the unit.

## 2021-12-09 NOTE — Progress Notes (Signed)
[facility] MD Progress Note  12/09/2021 1:54 PM [name] [name]  MRN:  [id number] Subjective: Follow-up 26 year old man with schizophrenia.  Pt seen and chart reviewed.   [name] [name] was still in bed around 11 am, but awake, yet withdrawn. [name] [name] reports feeling "ok", denied AVH or paranoid. Tolerates meds without complaints.    Principal Problem: Schizophrenia (HCC) Diagnosis: Principal Problem:   Schizophrenia (HCC) Active Problems:   Hypertension  Total Time spent with patient: 30 minutes  Past Psychiatric History: Past history of schizophrenia  Past Medical History:  Past Medical History:  Diagnosis Date   Asthma    Depression    Psychosis (HCC)    Schizoaffective disorder (HCC)     Past Surgical History:  Procedure Laterality Date   BACK SURGERY     I&D groin  2017   Family History: History reviewed. No pertinent family history. Family Psychiatric  History: See previous Social History:  Social History   Substance and Sexual Activity  Alcohol Use No     Social History   Substance and Sexual Activity  Drug Use Never    Social History   Socioeconomic History   Marital status: Single    Spouse name: Not on file   Number of children: Not on file   Years of education: Not on file   Highest education level: Not on file  Occupational History   Not on file  Tobacco Use   Smoking status: Never   Smokeless tobacco: Never  Vaping Use   Vaping Use: Some days  Substance and Sexual Activity   Alcohol use: No   Drug use: Never   Sexual activity: Not Currently  Other Topics Concern   Not on file  Social History Narrative   Not on file   Social Determinants of Health   Financial Resource Strain: Not on file  Food Insecurity: Not on file  Transportation Needs: Not on file  Physical Activity: Not on file  Stress: Not on file  Social Connections: Not on file   Additional Social History:    Sleep: Fair  Appetite:  Negative  Current Medications: Current  Facility-Administered Medications  Medication Dose Route Frequency Provider Last Rate Last Admin   acetaminophen (TYLENOL) tablet 650 mg  650 mg Oral Q6H PRN Vanetta Mulders, NP       alum & mag hydroxide-simeth (MAALOX/MYLANTA) 200-200-20 MG/5ML suspension 30 mL  30 mL Oral Q4H PRN Gabriel Cirri F, NP       cloZAPine (CLOZARIL) tablet 100 mg  100 mg Oral QHS [name], [name], MD   100 mg at 12/08/21 2112   divalproex (DEPAKOTE) DR tablet 500 mg  500 mg Oral Q12H [name], [name], MD   500 mg at 12/09/21 0827   LORazepam (ATIVAN) tablet 2 mg  2 mg Oral Q6H PRN [name], [name], MD       Or   LORazepam (ATIVAN) injection 2 mg  2 mg Intramuscular Q6H PRN [name], [name], MD       magnesium hydroxide (MILK OF MAGNESIA) suspension 30 mL  30 mL Oral Daily PRN Gabriel Cirri F, NP        Lab Results:  No results found for this or any previous visit (from the past 48 hour(s)).   Blood Alcohol level:  Lab Results  Component Value Date   [facility] <10 12/03/2021   ETH <10 07/18/2020    Metabolic Disorder Labs: Lab Results  Component Value Date   HGBA1C 5.1 12/03/2021  MPG 99.67 12/03/2021   MPG 91.06 07/19/2020   No results found for: PROLACTIN Lab Results  Component Value Date   CHOL 197 12/06/2021   TRIG 111 12/06/2021   HDL 46 12/06/2021   CHOLHDL 4.3 12/06/2021   VLDL 22 12/06/2021   LDLCALC 129 (H) 12/06/2021   LDLCALC 177 (H) 07/19/2020    Physical Findings: AIMS:  , ,  ,  ,    CIWA:    COWS:     Musculoskeletal: Strength & Muscle Tone: within normal limits Gait & Station: normal Patient leans: N/A  Psychiatric Specialty Exam:  Presentation  General Appearance: Casual; Well Groomed  Eye Contact:Good  Speech:Slow  Speech Volume:Decreased  Handedness:Right   Mood and Affect  Mood:Anxious  Affect:Inappropriate; Constricted   Thought Process  Thought Processes:Disorganized  Descriptions of Associations:Circumstantial  Orientation:Full (Time, Place  and Person)  Thought Content:Paranoid Ideation; Scattered; Illogical  History of Schizophrenia/Schizoaffective disorder:Yes  Duration of Psychotic Symptoms:Greater than six months  Hallucinations:No data recorded Ideas of Reference:Paranoia; Delusions  Suicidal Thoughts:No data recorded Homicidal Thoughts:No data recorded  Sensorium  Memory:Immediate Poor; Recent Poor; Remote Poor  Judgment:Poor  Insight:Poor   Executive Functions  Concentration:Fair  Attention Span:Poor  Recall:Poor  Fund of Knowledge:Poor  Language:Poor   Psychomotor Activity  Psychomotor Activity:No data recorded  Assets  Assets:Communication Skills; Desire for Improvement; Resilience; Social Support   Sleep  Sleep:No data recorded   Physical Exam: Physical Exam Vitals and nursing note reviewed.  Constitutional:      Appearance: Normal appearance.  HENT:     Head: Normocephalic and atraumatic.     Mouth/Throat:     Pharynx: Oropharynx is clear.  Eyes:     Pupils: Pupils are equal, round, and reactive to light.  Cardiovascular:     Rate and Rhythm: Normal rate and regular rhythm.  Pulmonary:     Effort: Pulmonary effort is normal.     Breath sounds: Normal breath sounds.  Abdominal:     General: Abdomen is flat.     Palpations: Abdomen is soft.  Musculoskeletal:        General: Normal range of motion.  Skin:    General: Skin is warm and dry.  Neurological:     General: No focal deficit present.     Mental Status: [name] [name] is alert. Mental status is at baseline.  Psychiatric:        Attention and Perception: [name] [name] is inattentive.        Mood and Affect: Mood normal. Affect is blunt.        Speech: [name] [name] is noncommunicative.        Behavior: Behavior is slowed.        Thought Content: Thought content is paranoid.   Review of Systems  Constitutional: Negative.   HENT: Negative.    Eyes: Negative.   Respiratory: Negative.    Cardiovascular: Negative.   Gastrointestinal: Negative.    Musculoskeletal: Negative.   Skin: Negative.   Neurological: Negative.   Psychiatric/Behavioral: Negative.    Blood pressure 106/68, pulse 74, temperature 97.6 F (36.4 C), temperature source Oral, resp. rate 20, height 6\' 1"  (1.854 m), weight 103.4 kg, SpO2 97 %. Body mass index is 30.08 kg/m.   Treatment Plan Summary: Daily contact with patient to assess and evaluate symptoms and progress in treatment and Medication management  Schizophrenia, paranoid type -- continue clozapine 100mg  qhs.  -- continue Depakote 500mg  po BID.   [name] Majewski, MD 12/09/2021, 1:54 PM

## 2021-12-09 NOTE — Group Note (Signed)
LCSW Group Therapy Note  Group Date: 12/08/2021 Start Time: 1430 End Time: 1530   Type of Therapy and Topic:  Group Therapy - Healthy vs Unhealthy Coping Skills  Participation Level:  Did Not Attend   Description of Group The focus of this group was to determine what unhealthy coping techniques typically are used by group members and what healthy coping techniques would be helpful in coping with various problems. Patients were guided in becoming aware of the differences between healthy and unhealthy coping techniques. Patients were asked to identify 2-3 healthy coping skills they would like to learn to use more effectively.  Therapeutic Goals Patients learned that coping is what human beings do all day long to deal with various situations in their lives Patients defined and discussed healthy vs unhealthy coping techniques Patients identified their preferred coping techniques and identified whether these were healthy or unhealthy Patients determined 2-3 healthy coping skills they would like to become more familiar with and use more often. Patients provided support and ideas to each other   Summary of Patient Progress: Patient did not attend group despite encouraged participation.   Therapeutic Modalities Cognitive Behavioral Therapy Motivational Interviewing  Norberto Sorenson, Theresia Majors 12/09/2021  8:37 AM

## 2021-12-09 NOTE — Progress Notes (Signed)
Patient did not stay outside very long. He is now back in bed.

## 2021-12-09 NOTE — Plan of Care (Signed)
D- Patient alert and oriented. Patient presents in a pleasant mood on assessment stating that he slept good last night and had no complaints to voice to this Clinical research associate. Patient continues to forward little when interacting with staff. He only gives off one-word answers and it seems as if he is saying what he thinks staff wants to hear, so that he will not be bothered with questions any further. Patient denies SI, HI, AVH, and pain at this time. Patient also denies any signs/symptoms of depression/anxiety stating that overall, he is feeling "good". Patient had no stated goals for today.  A- Scheduled medications administered to patient, per MD orders. Support and encouragement provided.  Routine safety checks conducted every 15 minutes.  Patient informed to notify staff with problems or concerns.  R- No adverse drug reactions noted. Patient contracts for safety at this time. Patient compliant with medications. Patient receptive, calm, and cooperative. Patient remains safe at this time.  Problem: Education: Goal: Knowledge of [place] [facility] Education information/materials will improve Outcome: Progressing Goal: Emotional status will improve Outcome: Progressing Goal: Mental status will improve Outcome: Progressing Goal: Verbalization of understanding the information provided will improve Outcome: Progressing   Problem: Health Behavior/Discharge Planning: Goal: Compliance with treatment plan for underlying cause of condition will improve Outcome: Progressing   Problem: Safety: Goal: Periods of time without injury will increase Outcome: Progressing   Problem: Self-Concept: Goal: Ability to identify factors that promote anxiety will improve Outcome: Progressing Goal: Level of anxiety will decrease Outcome: Progressing   Problem: Education: Goal: Will be free of psychotic symptoms Outcome: Progressing   Problem: Health Behavior/Discharge Planning: Goal: Compliance with prescribed  medication regimen will improve Outcome: Progressing

## 2021-12-09 NOTE — Progress Notes (Signed)
Patient just got up, out of bed, and went outside to the courtyard.

## 2021-12-09 NOTE — Progress Notes (Signed)
Patient has been in bed, sleeping, ever since lunch time. Patient remains safe on the unit.

## 2021-12-09 NOTE — Progress Notes (Signed)
Patient saw this writer in the medication room, so he came in asking if it was time to take his medicine. This Clinical research associate explained to patient that the next time for scheduled medication would be on the next shift. Patient verbalized understanding and walked out of the medication room. Patient has now started pacing the hallways again, as it seems he likes to do around this time, daily.

## 2021-12-10 DIAGNOSIS — F203 Undifferentiated schizophrenia: Secondary | ICD-10-CM | POA: Diagnosis not present

## 2021-12-10 NOTE — Plan of Care (Signed)
Patient stayed in bed most of the shift with minimal interactions with staff & peers.Patient states that he is doing good. Denies DI,HI and AVH. Encouraged for shower multiple times but patient did not do. Does not show any motivation for any activities. Appetite food. Support and encouragement given.

## 2021-12-10 NOTE — BH IP Treatment Plan (Signed)
Interdisciplinary Treatment and Diagnostic Plan Update  12/10/2021 Time of Session: 8:30 AM [name] [name] MRN: [id number]  Principal Diagnosis: Schizophrenia (HCC)  Secondary Diagnoses: Principal Problem:   Schizophrenia (HCC) Active Problems:   Hypertension   Current Medications:  Current Facility-Administered Medications  Medication Dose Route Frequency Provider Last Rate Last Admin   acetaminophen (TYLENOL) tablet 650 mg  650 mg Oral Q6H PRN Vanetta Mulders, NP       alum & mag hydroxide-simeth (MAALOX/MYLANTA) 200-200-20 MG/5ML suspension 30 mL  30 mL Oral Q4H PRN Vanetta Mulders, NP       cloZAPine (CLOZARIL) tablet 100 mg  100 mg Oral QHS [name], [name], MD   100 mg at 12/09/21 2203   divalproex (DEPAKOTE) DR tablet 500 mg  500 mg Oral Q12H [name], [name], MD   500 mg at 12/10/21 0849   LORazepam (ATIVAN) tablet 2 mg  2 mg Oral Q6H PRN [name], [name], MD       Or   LORazepam (ATIVAN) injection 2 mg  2 mg Intramuscular Q6H PRN [name], [name], MD       magnesium hydroxide (MILK OF MAGNESIA) suspension 30 mL  30 mL Oral Daily PRN Vanetta Mulders, NP       PTA Medications: Medications Prior to Admission  Medication Sig Dispense Refill Last Dose   divalproex (DEPAKOTE) 500 MG DR tablet Take 1 tablet (500 mg total) by mouth every 12 (twelve) hours. 60 tablet 0 11/30/2021   amLODipine (NORVASC) 10 MG tablet Take 1 tablet (10 mg total) by mouth daily. (Patient not taking: Reported on 12/04/2021) 30 tablet 0 Not Taking   cloZAPine (CLOZARIL) 50 MG tablet Take 1 tablet (50 mg total) by mouth daily. (Patient not taking: Reported on 12/04/2021) 30 tablet 0 Not Taking   cloZAPine (CLOZARIL) 50 MG tablet Take 5 tablets (250 mg total) by mouth at bedtime. (Patient not taking: Reported on 12/04/2021) 30 tablet 0 Not Taking   glycopyrrolate (ROBINUL) 2 MG tablet Take by mouth. (Patient not taking: Reported on 12/04/2021)   Not Taking   paliperidone (INVEGA SUSTENNA) 234 MG/1.5ML SUSY  injection Inject 234 mg into the muscle every 28 (twenty-eight) days. (Patient not taking: Reported on 12/04/2021) 1.5 mL 3 Not Taking   paliperidone (INVEGA SUSTENNA) 234 MG/1.5ML SUSY injection INJECT AT FACILITY 1.5 mL 0    temazepam (RESTORIL) 15 MG capsule Take 1 capsule (15 mg total) by mouth at bedtime as needed for sleep. (Patient not taking: Reported on 12/04/2021) 30 capsule 0 Not Taking    Patient Stressors: Marital or family conflict   Medication change or noncompliance    Patient Strengths: General fund of knowledge  Supportive family/friends   Treatment Modalities: Medication Management, Group therapy, Case management,  1 to 1 session with clinician, Psychoeducation, Recreational therapy.   Physician Treatment Plan for Primary Diagnosis: Schizophrenia (HCC) Long Term Goal(s): Improvement in symptoms so as ready for discharge   Short Term Goals: Ability to maintain clinical measurements within normal limits will improve Compliance with prescribed medications will improve Ability to verbalize feelings will improve Ability to demonstrate self-control will improve Ability to identify and develop effective coping behaviors will improve  Medication Management: Evaluate patient's response, side effects, and tolerance of medication regimen.  Therapeutic Interventions: 1 to 1 sessions, Unit Group sessions and Medication administration.  Evaluation of Outcomes: Progressing  Physician Treatment Plan for Secondary Diagnosis: Principal Problem:   Schizophrenia (HCC) Active Problems:   Hypertension  Long  Term Goal(s): Improvement in symptoms so as ready for discharge   Short Term Goals: Ability to maintain clinical measurements within normal limits will improve Compliance with prescribed medications will improve Ability to verbalize feelings will improve Ability to demonstrate self-control will improve Ability to identify and develop effective coping behaviors will improve      Medication Management: Evaluate patient's response, side effects, and tolerance of medication regimen.  Therapeutic Interventions: 1 to 1 sessions, Unit Group sessions and Medication administration.  Evaluation of Outcomes: Progressing   RN Treatment Plan for Primary Diagnosis: Schizophrenia (HCC) Long Term Goal(s): Knowledge of disease and therapeutic regimen to maintain health will improve  Short Term Goals: Ability to demonstrate self-control, Ability to participate in decision making will improve, Ability to verbalize feelings will improve, Ability to disclose and discuss suicidal ideas, Ability to identify and develop effective coping behaviors will improve, and Compliance with prescribed medications will improve  Medication Management: RN will administer medications as ordered by provider, will assess and evaluate patient's response and provide education to patient for prescribed medication. RN will report any adverse and/or side effects to prescribing provider.  Therapeutic Interventions: 1 on 1 counseling sessions, Psychoeducation, Medication administration, Evaluate responses to treatment, Monitor vital signs and CBGs as ordered, Perform/monitor CIWA, COWS, AIMS and Fall Risk screenings as ordered, Perform wound care treatments as ordered.  Evaluation of Outcomes: Progressing   LCSW Treatment Plan for Primary Diagnosis: Schizophrenia (HCC) Long Term Goal(s): Safe transition to appropriate next level of care at discharge, Engage patient in therapeutic group addressing interpersonal concerns.  Short Term Goals: Engage patient in aftercare planning with referrals and resources, Increase social support, Increase ability to appropriately verbalize feelings, Increase emotional regulation, Facilitate acceptance of mental health diagnosis and concerns, and Increase skills for wellness and recovery  Therapeutic Interventions: Assess for all discharge needs, 1 to 1 time with Social worker,  Explore available resources and support systems, Assess for adequacy in community support network, Educate family and significant other(s) on suicide prevention, Complete Psychosocial Assessment, Interpersonal group therapy.  Evaluation of Outcomes: Progressing   Progress in Treatment: Attending groups: No. Participating in groups: No. Taking medication as prescribed: Yes. Toleration medication: Yes. Family/Significant other contact made: No, will contact:  if given permission by pt. Patient understands diagnosis: Yes. Discussing patient identified problems/goals with staff: Yes. Medical problems stabilized or resolved: Yes. Denies suicidal/homicidal ideation: Yes. Issues/concerns per patient self-inventory: No. Other: none.  New problem(s) identified: No, Describe:  none identified.  New Short Term/Long Term Goal(s): elimination of symptoms of psychosis, medication management for mood stabilization; elimination of SI thoughts; development of comprehensive mental wellness plan. Update 12/10/21: No changes at this time.    Patient Goals:  "learning to think about things clearly and focus, imagine things clearly" Update 12/10/21: No changes at this time.    Discharge Plan or Barriers: CSW will assist patient with developing appropriate discharge plans. Update 12/10/21: No changes at this time.     Reason for Continuation of Hospitalization: Anxiety Depression Medication stabilization Suicidal ideation   Estimated Length of Stay:  1-7 days  Last 3 [place] Suicide Severity Risk Score: Flowsheet Row Admission (Current) from 12/04/2021 in [facility] ED from [facility]/03[facility]2021 [facility]  C-SSRS RISK CATEGORY No Risk No Risk No Risk       Last PHQ 2/9 Scores:     View : No data to display.  Scribe for Treatment Team: Glenis Smokerarl R  Tippi Mccrae, Alexander MtLCSW 12/10/2021 9:04 AM

## 2021-12-10 NOTE — Group Note (Signed)
[facility] LCSW Group Therapy Note    Group Date: 12/10/2021 Start Time: 1300 End Time: 1400  Type of Therapy and Topic:  Group Therapy:  Overcoming Obstacles  Participation Level:  [facility] PARTICIPATION LEVEL: Did Not Attend  Mood:  Description of Group:   In this group patients will be encouraged to explore what they see as obstacles to their own wellness and recovery. They will be guided to discuss their thoughts, feelings, and behaviors related to these obstacles. The group will process together ways to cope with barriers, with attention given to specific choices patients can make. Each patient will be challenged to identify changes they are motivated to make in order to overcome their obstacles. This group will be process-oriented, with patients participating in exploration of their own experiences as well as giving and receiving support and challenge from other group members.  Therapeutic Goals: 1. Patient will identify personal and current obstacles as they relate to admission. 2. Patient will identify barriers that currently interfere with their wellness or overcoming obstacles.  3. Patient will identify feelings, thought process and behaviors related to these barriers. 4. Patient will identify two changes they are willing to make to overcome these obstacles:    Summary of Patient Progress   X   Therapeutic Modalities:   Cognitive Behavioral Therapy Solution Focused Therapy Motivational Interviewing Relapse Prevention Therapy   Gavriel Holzhauer J Alvis Edgell, LCSW 

## 2021-12-10 NOTE — Progress Notes (Signed)
Pt visible on the unit, minimal interaction with peers/staff. Pt denies SI/HI and AVH. He denies having anxiety or depression. Pt instructed to masturbate in the bathroom, due to he had a window on his door and people could see him. Pt has a fixed smile, guarded about discussing thoughts and he seem to be responding to internal stimuli.

## 2021-12-10 NOTE — Progress Notes (Signed)
Pt visible on the unit, no interaction with peers or staff. He denies SI/HI and AVH. Pt will not talk about his situation. He eats a lot. He is taking his medication. He is preoccupied with thoughts/responding to internal stimuli.

## 2021-12-10 NOTE — Plan of Care (Signed)
  Problem: Education: Goal: Knowledge of [place] [facility] Education information/materials will improve Outcome: Progressing Goal: Emotional status will improve Outcome: Progressing Goal: Mental status will improve Outcome: Progressing Goal: Verbalization of understanding the information provided will improve Outcome: Progressing   Problem: Health Behavior/Discharge Planning: Goal: Compliance with treatment plan for underlying cause of condition will improve Outcome: Progressing   Problem: Safety: Goal: Periods of time without injury will increase Outcome: Progressing   Problem: Self-Concept: Goal: Ability to identify factors that promote anxiety will improve Outcome: Progressing Goal: Level of anxiety will decrease Outcome: Progressing   Problem: Education: Goal: Will be free of psychotic symptoms Outcome: Progressing   Problem: Health Behavior/Discharge Planning: Goal: Compliance with prescribed medication regimen will improve Outcome: Progressing

## 2021-12-10 NOTE — Progress Notes (Signed)
[facility]  12/10/2021 10:56 AM Emeline GinsMohmed A Aston  MRN:  [id number] Subjective: Follow-up 26 year old man with schizophrenia.  Patient seen and chart reviewed.  Still spends most of his time in his room.  On interview today he tells me that he is feeling fine and everything is good and denies all hallucinations.  Unfortunately this is usually what he will tell me even when he is obviously suffering at his worst.  He has not had any major behavior problems but stays pretty isolated.  He is compliant with medicine.  I tried to form some rapport and encouraged him to please get out of bed and be active. Principal Problem: Schizophrenia (HCC) Diagnosis: Principal Problem:   Schizophrenia (HCC) Active Problems:   Hypertension  Total Time spent with patient: 30 minutes  Past Psychiatric History: Past history of schizophrenia and multiple hospitalizations  Past Medical History:  Past Medical History:  Diagnosis Date   Asthma    Depression    Psychosis (HCC)    Schizoaffective disorder (HCC)     Past Surgical History:  Procedure Laterality Date   BACK SURGERY     I&D groin  2017   Family History: History reviewed. No pertinent family history. Family Psychiatric  History: See previous Social History:  Social History   Substance and Sexual Activity  Alcohol Use No     Social History   Substance and Sexual Activity  Drug Use Never    Social History   Socioeconomic History   Marital status: Single    Spouse name: Not on file   Number of children: Not on file   Years of education: Not on file   Highest education level: Not on file  Occupational History   Not on file  Tobacco Use   Smoking status: Never   Smokeless tobacco: Never  Vaping Use   Vaping Use: Some days  Substance and Sexual Activity   Alcohol use: No   Drug use: Never   Sexual activity: Not Currently  Other Topics Concern   Not on file  Social History Narrative   Not on file   Social  Determinants of Health   Financial Resource Strain: Not on file  Food Insecurity: Not on file  Transportation Needs: Not on file  Physical Activity: Not on file  Stress: Not on file  Social Connections: Not on file   Additional Social History:                         Sleep: Fair  Appetite:  Fair  Current Medications: Current Facility-Administered Medications  Medication Dose Route Frequency Provider Last Rate Last Admin   acetaminophen (TYLENOL) tablet 650 mg  650 mg Oral Q6H PRN Vanetta MuldersBarthold, Louise F, NP       alum & mag hydroxide-simeth (MAALOX/MYLANTA) 200-200-20 MG/5ML suspension 30 mL  30 mL Oral Q4H PRN Gabriel CirriBarthold, Louise F, NP       cloZAPine (CLOZARIL) tablet 100 mg  100 mg Oral QHS Wissam Resor T, MD   100 mg at 12/09/21 2203   divalproex (DEPAKOTE) DR tablet 500 mg  500 mg Oral Q12H Farid Grigorian T, MD   500 mg at 12/10/21 0849   LORazepam (ATIVAN) tablet 2 mg  2 mg Oral Q6H PRN Earnest Thalman, [name], MD       Or   LORazepam (ATIVAN) injection 2 mg  2 mg Intramuscular Q6H PRN Pocahontas Cohenour, [name], MD       magnesium  hydroxide (MILK OF MAGNESIA) suspension 30 mL  30 mL Oral Daily PRN Vanetta Mulders, NP        Lab Results: No results found for this or any previous visit (from the past 48 hour(s)).  Blood Alcohol level:  Lab Results  Component Value Date   ETH <10 12/03/2021   ETH <10 07/18/2020    Metabolic Disorder Labs: Lab Results  Component Value Date   HGBA1C 5.1 12/03/2021   MPG 99.67 12/03/2021   MPG 91.06 07/19/2020   No results found for: PROLACTIN Lab Results  Component Value Date   CHOL 197 12/06/2021   TRIG 111 12/06/2021   HDL 46 12/06/2021   CHOLHDL 4.3 12/06/2021   VLDL 22 12/06/2021   LDLCALC 129 (H) 12/06/2021   LDLCALC 177 (H) 07/19/2020    Physical Findings: AIMS:  , ,  ,  ,    CIWA:    COWS:     Musculoskeletal: Strength & Muscle Tone: within normal limits Gait & Station: normal Patient leans: N/A  Psychiatric Specialty  Exam:  Presentation  General Appearance: Casual; Well Groomed  Eye Contact:Good  Speech:Slow  Speech Volume:Decreased  Handedness:Right   Mood and Affect  Mood:Anxious  Affect:Inappropriate; Constricted   Thought Process  Thought Processes:Disorganized  Descriptions of Associations:Circumstantial  Orientation:Full (Time, Place and Person)  Thought Content:Paranoid Ideation; Scattered; Illogical  History of Schizophrenia/Schizoaffective disorder:Yes  Duration of Psychotic Symptoms:Greater than six months  Hallucinations:No data recorded Ideas of Reference:Paranoia; Delusions  Suicidal Thoughts:No data recorded Homicidal Thoughts:No data recorded  Sensorium  Memory:Immediate Poor; Recent Poor; Remote Poor  Judgment:Poor  Insight:Poor   Executive Functions  Concentration:Fair  Attention Span:Poor  Recall:Poor  Fund of Knowledge:Poor  Language:Poor   Psychomotor Activity  Psychomotor Activity:No data recorded  Assets  Assets:Communication Skills; Desire for Improvement; Resilience; Social Support   Sleep  Sleep:No data recorded   Physical Exam: Physical Exam Vitals and nursing note reviewed.  Constitutional:      Appearance: Normal appearance.  HENT:     Head: Normocephalic and atraumatic.     Mouth/Throat:     Pharynx: Oropharynx is clear.  Eyes:     Pupils: Pupils are equal, round, and reactive to light.  Cardiovascular:     Rate and Rhythm: Normal rate and regular rhythm.  Pulmonary:     Effort: Pulmonary effort is normal.     Breath sounds: Normal breath sounds.  Abdominal:     General: Abdomen is flat.     Palpations: Abdomen is soft.  Musculoskeletal:        General: Normal range of motion.  Skin:    General: Skin is warm and dry.  Neurological:     General: No focal deficit present.     Mental Status: He is alert. Mental status is at baseline.  Psychiatric:        Attention and Perception: Attention normal.         Mood and Affect: Mood normal. Affect is blunt.        Speech: Speech is delayed.        Behavior: Behavior is slowed. Behavior is cooperative.        Thought Content: Thought content normal.   Review of Systems  Constitutional: Negative.   HENT: Negative.    Eyes: Negative.   Respiratory: Negative.    Cardiovascular: Negative.   Gastrointestinal: Negative.   Musculoskeletal: Negative.   Skin: Negative.   Neurological: Negative.   Psychiatric/Behavioral: Negative.    Blood pressure  106/68, pulse 74, temperature 97.6 F (36.4 C), temperature source Oral, resp. rate 20, height 6\' 1"  (1.854 m), weight 103.4 kg, SpO2 97 %. Body mass index is 30.08 kg/m.   Treatment Plan Summar continue current medicine and encourage patient to be up and out of bed.  , MD 12/10/2021, 10:56 AM

## 2021-12-11 DIAGNOSIS — F203 Undifferentiated schizophrenia: Secondary | ICD-10-CM | POA: Diagnosis not present

## 2021-12-11 MED ORDER — CLOZAPINE 25 MG PO TABS
150.0000 mg | ORAL_TABLET | Freq: Every day | ORAL | Status: DC
  Administered 2021-12-11 – 2021-12-12 (×2): 150 mg via ORAL
  Filled 2021-12-11 (×2): qty 2

## 2021-12-11 NOTE — Progress Notes (Signed)
Patient continues to isolate to room, except for meals and medication.

## 2021-12-11 NOTE — Progress Notes (Signed)
Pt presents with paranoia and pacing the hallway. Pt denies SI/HI/AVH. Pt compliant with medication administration per MD orders. Pt give education and encouragement to be active in his treatment plan. Pt being monitored Q 15 minutes for safety per unit protocol. Pt remains safe on the unit.  

## 2021-12-11 NOTE — Progress Notes (Signed)
Recreation Therapy Notes  Date: 12/11/2021  Time: 10:40am    Location: Craft room   Behavioral response: N/A   Intervention Topic: Relaxation    Discussion/Intervention: Patient refused to attend group.   Clinical Observations/Feedback:  Patient refused to attend group.    Leland Staszewski LRT/CTRS         Alando Colleran 12/11/2021 12:13 PM

## 2021-12-11 NOTE — Group Note (Signed)
[name] LCSW Group Therapy Note   Group Date: 12/11/2021 Start Time: 1300 End Time: 1400  Type of Therapy/Topic:  Group Therapy:  Feelings about Diagnosis  Participation Level:  Did Not Attend   Description of Group:    This group will allow patients to explore their thoughts and feelings about diagnoses they have received. Patients will be guided to explore their level of understanding and acceptance of these diagnoses. Facilitator will encourage patients to process their thoughts and feelings about the reactions of others to their diagnosis, and will guide patients in identifying ways to discuss their diagnosis with significant others in their lives. This group will be process-oriented, with patients participating in exploration of their own experiences as well as giving and receiving support and challenge from other group members.   Therapeutic Goals: 1. Patient will demonstrate understanding of diagnosis as evidence by identifying two or more symptoms of the disorder:  2. Patient will be able to express two feelings regarding the diagnosis 3. Patient will demonstrate ability to communicate their needs through discussion and/or role plays  Summary of Patient Progress: X  Therapeutic Modalities:   Cognitive Behavioral Therapy Brief Therapy Feelings Identification    Shirl Harris, LCSW

## 2021-12-11 NOTE — Plan of Care (Addendum)
D- Patient alert and oriented. Patient presented in a pleasant mood on assessment stating that he slept "good" last night and had no complaints to voice to this Probation officer. Patient denied SI, HI, AVH, and pain at this time. Patient also denied any signs/symptoms of depression and anxiety, stating  that he is feeling "good" overall. When this writer asked patient if he had any goals for today, he stated "stay positive", and then he changed his mind and said "stay healthy". This Probation officer explained to patient that both goals were good, but then he stated "no, don't write that down", regarding the staying positive goal. This Probation officer believes that patient is only saying what he feels will get him discharged quicker, or what he thinks staff wants to hear in order to say he's progressing. However, he has been coming out of his room for meals and medication, but other than that, he stays in bed and sleeps all day.  A- Scheduled medications administered to patient, per MD orders. Support and encouragement provided.  Routine safety checks conducted every 15 minutes.  Patient informed to notify staff with problems or concerns.  R- No adverse drug reactions noted. Patient contracts for safety at this time. Patient compliant with medications. Patient receptive, calm, and cooperative. Patient remains safe at this time.  Problem: Education: Goal: Knowledge of [place] [facility] Education information/materials will improve Outcome: Progressing Goal: Emotional status will improve Outcome: Progressing Goal: Mental status will improve Outcome: Progressing Goal: Verbalization of understanding the information provided will improve Outcome: Progressing   Problem: Health Behavior/Discharge Planning: Goal: Compliance with treatment plan for underlying cause of condition will improve Outcome: Progressing   Problem: Safety: Goal: Periods of time without injury will increase Outcome: Progressing   Problem:  Self-Concept: Goal: Ability to identify factors that promote anxiety will improve Outcome: Progressing Goal: Level of anxiety will decrease Outcome: Progressing   Problem: Education: Goal: Will be free of psychotic symptoms Outcome: Progressing   Problem: Health Behavior/Discharge Planning: Goal: Compliance with prescribed medication regimen will improve Outcome: Progressing

## 2021-12-11 NOTE — Progress Notes (Signed)
[facility] MD Progress Note  12/11/2021 12:29 PM [name] [name]  MRN:  [id number] Subjective: Follow-up 26 year old man with schizophrenia.  Spends all of his time in bed.  Hardly ever comes out except to eat.  On interview he will only tell me that he is fine and wants to be discharged.  Minimizes all symptoms although he does admit that he still has hallucinations at times.  Insight very poor.  Does not seem to show much interest in improving the overall problem.  Tolerating medicine well. Principal Problem: Schizophrenia (HCC) Diagnosis: Principal Problem:   Schizophrenia (HCC) Active Problems:   Hypertension  Total Time spent with patient: 30 minutes  Past Psychiatric History: Past history of schizophrenia with severe chronic paranoia  Past Medical History:  Past Medical History:  Diagnosis Date   Asthma    Depression    Psychosis (HCC)    Schizoaffective disorder (HCC)     Past Surgical History:  Procedure Laterality Date   BACK SURGERY     I&D groin  2017   Family History: History reviewed. No pertinent family history. Family Psychiatric  History: See previous Social History:  Social History   Substance and Sexual Activity  Alcohol Use No     Social History   Substance and Sexual Activity  Drug Use Never    Social History   Socioeconomic History   Marital status: Single    Spouse name: Not on file   Number of children: Not on file   Years of education: Not on file   Highest education level: Not on file  Occupational History   Not on file  Tobacco Use   Smoking status: Never   Smokeless tobacco: Never  Vaping Use   Vaping Use: Some days  Substance and Sexual Activity   Alcohol use: No   Drug use: Never   Sexual activity: Not Currently  Other Topics Concern   Not on file  Social History Narrative   Not on file   Social Determinants of Health   Financial Resource Strain: Not on file  Food Insecurity: Not on file  Transportation Needs: Not on file   Physical Activity: Not on file  Stress: Not on file  Social Connections: Not on file   Additional Social History:                         Sleep: Fair  Appetite:  Fair  Current Medications: Current Facility-Administered Medications  Medication Dose Route Frequency Provider Last Rate Last Admin   acetaminophen (TYLENOL) tablet 650 mg  650 mg Oral Q6H PRN Vanetta Mulders, NP       alum & mag hydroxide-simeth (MAALOX/MYLANTA) 200-200-20 MG/5ML suspension 30 mL  30 mL Oral Q4H PRN Gabriel Cirri F, NP       cloZAPine (CLOZARIL) tablet 100 mg  100 mg Oral QHS Cutler Sunday T, MD   100 mg at 12/10/21 2209   divalproex (DEPAKOTE) DR tablet 500 mg  500 mg Oral Q12H Genaro Bekker T, MD   500 mg at 12/11/21 1884   LORazepam (ATIVAN) tablet 2 mg  2 mg Oral Q6H PRN Fleda Pagel, [name], MD       Or   LORazepam (ATIVAN) injection 2 mg  2 mg Intramuscular Q6H PRN Devlyn Retter T, MD       magnesium hydroxide (MILK OF MAGNESIA) suspension 30 mL  30 mL Oral Daily PRN Vanetta Mulders, NP  Lab Results: No results found for this or any previous visit (from the past 48 hour(s)).  Blood Alcohol level:  Lab Results  Component Value Date   ETH <10 12/03/2021   ETH <10 07/18/2020    Metabolic Disorder Labs: Lab Results  Component Value Date   HGBA1C 5.1 12/03/2021   MPG 99.67 12/03/2021   MPG 91.06 07/19/2020   No results found for: PROLACTIN Lab Results  Component Value Date   CHOL 197 12/06/2021   TRIG 111 12/06/2021   HDL 46 12/06/2021   CHOLHDL 4.3 12/06/2021   VLDL 22 12/06/2021   LDLCALC 129 (H) 12/06/2021   LDLCALC 177 (H) 07/19/2020    Physical Findings: AIMS:  , ,  ,  ,    CIWA:    COWS:     Musculoskeletal: Strength & Muscle Tone: within normal limits Gait & Station: normal Patient leans: N/A  Psychiatric Specialty Exam:  Presentation  General Appearance: Casual; Well Groomed  Eye Contact:Good  Speech:Slow  Speech  Volume:Decreased  Handedness:Right   Mood and Affect  Mood:Anxious  Affect:Inappropriate; Constricted   Thought Process  Thought Processes:Disorganized  Descriptions of Associations:Circumstantial  Orientation:Full (Time, Place and Person)  Thought Content:Paranoid Ideation; Scattered; Illogical  History of Schizophrenia/Schizoaffective disorder:Yes  Duration of Psychotic Symptoms:Greater than six months  Hallucinations:No data recorded Ideas of Reference:Paranoia; Delusions  Suicidal Thoughts:No data recorded Homicidal Thoughts:No data recorded  Sensorium  Memory:Immediate Poor; Recent Poor; Remote Poor  Judgment:Poor  Insight:Poor   Executive Functions  Concentration:Fair  Attention Span:Poor  Recall:Poor  Fund of Knowledge:Poor  Language:Poor   Psychomotor Activity  Psychomotor Activity:No data recorded  Assets  Assets:Communication Skills; Desire for Improvement; Resilience; Social Support   Sleep  Sleep:No data recorded   Physical Exam: Physical Exam Vitals and nursing note reviewed.  Constitutional:      Appearance: Normal appearance.  HENT:     Head: Normocephalic and atraumatic.     Mouth/Throat:     Pharynx: Oropharynx is clear.  Eyes:     Pupils: Pupils are equal, round, and reactive to light.  Cardiovascular:     Rate and Rhythm: Normal rate and regular rhythm.  Pulmonary:     Effort: Pulmonary effort is normal.     Breath sounds: Normal breath sounds.  Abdominal:     General: Abdomen is flat.     Palpations: Abdomen is soft.  Musculoskeletal:        General: Normal range of motion.  Skin:    General: Skin is warm and dry.  Neurological:     General: No focal deficit present.     Mental Status: He is alert. Mental status is at baseline.  Psychiatric:        Attention and Perception: He is inattentive.        Mood and Affect: Mood normal. Affect is blunt.        Speech: Speech is delayed.        Behavior: Behavior is  slowed.        Cognition and Memory: Cognition is impaired.   Review of Systems  Constitutional: Negative.   HENT: Negative.    Eyes: Negative.   Respiratory: Negative.    Cardiovascular: Negative.   Gastrointestinal: Negative.   Musculoskeletal: Negative.   Skin: Negative.   Neurological: Negative.   Psychiatric/Behavioral: Negative.    Blood pressure 120/87, pulse 89, temperature (!) 97.5 F (36.4 C), temperature source Oral, resp. rate 18, height 6\' 1"  (1.854 m), weight 103.4 kg, SpO2 99 %.  Body mass index is 30.08 kg/m.   Treatment Plan Summary: Medication management and Plan increase Clozapine to 150 mg at night.  Continue Depakote.  Encourage group participation.  Try to form some rapport explaining to him that I understand the psychotic symptoms are frightening but that we can really do something about them to get them better.  Little response except for him to keep putting his head back under the blankets.  Mordecai Rasmussen, MD 12/11/2021, 12:29 PM

## 2021-12-12 DIAGNOSIS — F203 Undifferentiated schizophrenia: Secondary | ICD-10-CM | POA: Diagnosis not present

## 2021-12-12 NOTE — Plan of Care (Signed)
D: Pt alert and oriented. Pt denies experiencing any anxiety/depression at this time. Pt denies experiencing any pain at this time. Pt denies experiencing any SI/HI, or AVH at this time.   A: Scheduled medications administered to pt, per MD orders. Support and encouragement provided. Frequent verbal contact made. Routine safety checks conducted q15 minutes.   R: No adverse drug reactions noted. Pt verbally contracts for safety at this time. Pt compliant with medications. Pt interacts minimally with others on the unit, mostly self isolating to room with exception to medication administration and meals. Pt remains safe at this time. Will continue to monitor.   Problem: Education: Goal: Verbalization of understanding the information provided will improve Outcome: Not Progressing   Problem: Self-Concept: Goal: Ability to identify factors that promote anxiety will improve Outcome: Not Progressing

## 2021-12-12 NOTE — Progress Notes (Signed)
Pt presents with less paranoia tonight. Pt observed interacting appropriately with staff and peers on the unit. Pt denies SI/HI/AVH. Pt compliant with medication administration per MD orders. Pt give education and encouragement to be active in his treatment plan. Pt being monitored Q 15 minutes for safety per unit protocol. Pt remains safe on the unit.

## 2021-12-12 NOTE — Progress Notes (Signed)
[facility] MD Progress Note  12/12/2021 3:11 PM [name] [name]  MRN:  [id number] Subjective: Follow-up with this patient with schizophrenia.  I have grown used to not putting much stock in his literal words to me as he always tells me that everything is fine.  Instead I noted that he did sit up at least in bed to speak with me and made better eye contact and also that nursing reports he has been a little more active and out of his room.  He is taking his medicine and appears to be tolerating it okay.  This may be some beginnings of improvement related to clozapine Principal Problem: Schizophrenia (HCC) Diagnosis: Principal Problem:   Schizophrenia (HCC) Active Problems:   Hypertension  Total Time spent with patient: 30 minutes  Past Psychiatric History: Past history of schizophrenia and medicine noncompliance  Past Medical History:  Past Medical History:  Diagnosis Date   Asthma    Depression    Psychosis (HCC)    Schizoaffective disorder (HCC)     Past Surgical History:  Procedure Laterality Date   BACK SURGERY     I&D groin  2017   Family History: History reviewed. No pertinent family history. Family Psychiatric  History: See previous Social History:  Social History   Substance and Sexual Activity  Alcohol Use No     Social History   Substance and Sexual Activity  Drug Use Never    Social History   Socioeconomic History   Marital status: Single    Spouse name: Not on file   Number of children: Not on file   Years of education: Not on file   Highest education level: Not on file  Occupational History   Not on file  Tobacco Use   Smoking status: Never   Smokeless tobacco: Never  Vaping Use   Vaping Use: Some days  Substance and Sexual Activity   Alcohol use: No   Drug use: Never   Sexual activity: Not Currently  Other Topics Concern   Not on file  Social History Narrative   Not on file   Social Determinants of Health   Financial Resource Strain: Not on file   Food Insecurity: Not on file  Transportation Needs: Not on file  Physical Activity: Not on file  Stress: Not on file  Social Connections: Not on file   Additional Social History:                         Sleep: Fair  Appetite:  Fair  Current Medications: Current Facility-Administered Medications  Medication Dose Route Frequency Provider Last Rate Last Admin   acetaminophen (TYLENOL) tablet 650 mg  650 mg Oral Q6H PRN Vanetta Mulders, NP       alum & mag hydroxide-simeth (MAALOX/MYLANTA) 200-200-20 MG/5ML suspension 30 mL  30 mL Oral Q4H PRN Gabriel Cirri F, NP       cloZAPine (CLOZARIL) tablet 150 mg  150 mg Oral QHS Ryland Tungate T, MD   150 mg at 12/11/21 2113   divalproex (DEPAKOTE) DR tablet 500 mg  500 mg Oral Q12H Isac Lincks T, MD   500 mg at 12/12/21 0752   LORazepam (ATIVAN) tablet 2 mg  2 mg Oral Q6H PRN Mar Zettler, [name], MD       Or   LORazepam (ATIVAN) injection 2 mg  2 mg Intramuscular Q6H PRN Demetrious Rainford, [name], MD       magnesium hydroxide (MILK OF MAGNESIA)  suspension 30 mL  30 mL Oral Daily PRN Vanetta Mulders, NP        Lab Results: No results found for this or any previous visit (from the past 48 hour(s)).  Blood Alcohol level:  Lab Results  Component Value Date   ETH <10 12/03/2021   ETH <10 07/18/2020    Metabolic Disorder Labs: Lab Results  Component Value Date   HGBA1C 5.1 12/03/2021   MPG 99.67 12/03/2021   MPG 91.06 07/19/2020   No results found for: PROLACTIN Lab Results  Component Value Date   CHOL 197 12/06/2021   TRIG 111 12/06/2021   HDL 46 12/06/2021   CHOLHDL 4.3 12/06/2021   VLDL 22 12/06/2021   LDLCALC 129 (H) 12/06/2021   LDLCALC 177 (H) 07/19/2020    Physical Findings: AIMS:  , ,  ,  ,    CIWA:    COWS:     Musculoskeletal: Strength & Muscle Tone: within normal limits Gait & Station: normal Patient leans: N/A  Psychiatric Specialty Exam:  Presentation  General Appearance: Casual; Well  Groomed  Eye Contact:Good  Speech:Slow  Speech Volume:Decreased  Handedness:Right   Mood and Affect  Mood:Anxious  Affect:Inappropriate; Constricted   Thought Process  Thought Processes:Disorganized  Descriptions of Associations:Circumstantial  Orientation:Full (Time, Place and Person)  Thought Content:Paranoid Ideation; Scattered; Illogical  History of Schizophrenia/Schizoaffective disorder:Yes  Duration of Psychotic Symptoms:Greater than six months  Hallucinations:No data recorded Ideas of Reference:Paranoia; Delusions  Suicidal Thoughts:No data recorded Homicidal Thoughts:No data recorded  Sensorium  Memory:Immediate Poor; Recent Poor; Remote Poor  Judgment:Poor  Insight:Poor   Executive Functions  Concentration:Fair  Attention Span:Poor  Recall:Poor  Fund of Knowledge:Poor  Language:Poor   Psychomotor Activity  Psychomotor Activity:No data recorded  Assets  Assets:Communication Skills; Desire for Improvement; Resilience; Social Support   Sleep  Sleep:No data recorded   Physical Exam: Physical Exam Vitals and nursing note reviewed.  Constitutional:      Appearance: Normal appearance.  HENT:     Head: Normocephalic and atraumatic.     Mouth/Throat:     Pharynx: Oropharynx is clear.  Eyes:     Pupils: Pupils are equal, round, and reactive to light.  Cardiovascular:     Rate and Rhythm: Normal rate and regular rhythm.  Pulmonary:     Effort: Pulmonary effort is normal.     Breath sounds: Normal breath sounds.  Abdominal:     General: Abdomen is flat.     Palpations: Abdomen is soft.  Musculoskeletal:        General: Normal range of motion.  Skin:    General: Skin is warm and dry.  Neurological:     General: No focal deficit present.     Mental Status: He is alert. Mental status is at baseline.  Psychiatric:        Attention and Perception: He is inattentive.        Mood and Affect: Mood normal. Affect is blunt.         Speech: Speech is delayed.        Behavior: Behavior is slowed.   Review of Systems  Constitutional: Negative.   HENT: Negative.    Eyes: Negative.   Respiratory: Negative.    Cardiovascular: Negative.   Gastrointestinal: Negative.   Musculoskeletal: Negative.   Skin: Negative.   Neurological: Negative.   Psychiatric/Behavioral: Negative.    Blood pressure (!) 127/91, pulse 96, temperature 98.1 F (36.7 C), temperature source Oral, resp. rate 18, height 6\' 1"  (  1.854 m), weight 103.4 kg, SpO2 99 %. Body mass index is 30.08 kg/m.   Treatment Plan Summary: Medication management and Plan informed patient that we were gradually titrating up Clozapine.  He was agreeable to that.  Encouraged him to be up out of bed and interacting more.  Mordecai RasmussenJohn Chayce Robbins, MD 12/12/2021, 3:11 PM

## 2021-12-12 NOTE — Group Note (Signed)
[facility] LCSW Group Therapy Note   Group Date: 12/12/2021 Start Time: 1300 End Time: 1400   Type of Therapy/Topic:  Group Therapy:  Emotion Regulation  Participation Level:  Did Not Attend   Mood:  Description of Group:    The purpose of this group is to assist patients in learning to regulate negative emotions and experience positive emotions. Patients will be guided to discuss ways in which they have been vulnerable to their negative emotions. These vulnerabilities will be juxtaposed with experiences of positive emotions or situations, and patients challenged to use positive emotions to combat negative ones. Special emphasis will be placed on coping with negative emotions in conflict situations, and patients will process healthy conflict resolution skills.  Therapeutic Goals: Patient will identify two positive emotions or experiences to reflect on in order to balance out negative emotions:  Patient will label two or more emotions that they find the most difficult to experience:  Patient will be able to demonstrate positive conflict resolution skills through discussion or role plays:   Summary of Patient Progress:   X    Therapeutic Modalities:   Cognitive Behavioral Therapy Feelings Identification Dialectical Behavioral Therapy   Keelee Yankey J Burhan Barham, LCSW 

## 2021-12-12 NOTE — Progress Notes (Signed)
Recreation Therapy Notes   Date: 12/12/2021  Time: 10:45am    Location: Courtyard    Behavioral response: N/A   Intervention Topic: Leisure    Discussion/Intervention: Patient refused to attend group.   Clinical Observations/Feedback:  Patient refused to attend group.    Danyia Borunda LRT/CTRS        Derrel Moore 12/12/2021 1:19 PM

## 2021-12-13 DIAGNOSIS — F203 Undifferentiated schizophrenia: Secondary | ICD-10-CM | POA: Diagnosis not present

## 2021-12-13 LAB — CBC WITH DIFFERENTIAL/PLATELET
Abs Immature Granulocytes: 0.04 10*3/uL (ref 0.00–0.07)
Basophils Absolute: 0 10*3/uL (ref 0.0–0.1)
Basophils Relative: 0 %
Eosinophils Absolute: 0 10*3/uL (ref 0.0–0.5)
Eosinophils Relative: 0 %
HCT: 41.6 % (ref 39.0–52.0)
Hemoglobin: 14.2 g/dL (ref 13.0–17.0)
Immature Granulocytes: 1 %
Lymphocytes Relative: 42 %
Lymphs Abs: 2.2 10*3/uL (ref 0.7–4.0)
MCH: 27.9 pg (ref 26.0–34.0)
MCHC: 34.1 g/dL (ref 30.0–36.0)
MCV: 81.7 fL (ref 80.0–100.0)
Monocytes Absolute: 0.4 10*3/uL (ref 0.1–1.0)
Monocytes Relative: 7 %
Neutro Abs: 2.6 10*3/uL (ref 1.7–7.7)
Neutrophils Relative %: 50 %
Platelets: 241 10*3/uL (ref 150–400)
RBC: 5.09 MIL/uL (ref 4.22–5.81)
RDW: 12.6 % (ref 11.5–15.5)
WBC: 5.2 10*3/uL (ref 4.0–10.5)
nRBC: 0 % (ref 0.0–0.2)

## 2021-12-13 MED ORDER — CLOZAPINE 100 MG PO TABS
200.0000 mg | ORAL_TABLET | Freq: Every day | ORAL | Status: DC
  Administered 2021-12-13 – 2021-12-18 (×6): 200 mg via ORAL
  Filled 2021-12-13 (×6): qty 2

## 2021-12-13 NOTE — Plan of Care (Signed)
D: Pt alert and oriented. Pt denies experiencing any anxiety/depression at this time. Pt denies experiencing any pain at this time. Pt denies experiencing any SI/HI, or AVH at this time.   A: Scheduled medications administered to pt, per MD orders. Support and encouragement provided. Frequent verbal contact made. Routine safety checks conducted q15 minutes.   R: No adverse drug reactions noted. Pt verbally contracts for safety at this time. Pt compliant with medications and treatment plan. Pt interacts minimally with others on the unit. Pt remains safe at this time. Will continue to monitor.   Problem: Self-Concept: Goal: Level of anxiety will decrease Outcome: Progressing   Problem: Health Behavior/Discharge Planning: Goal: Compliance with prescribed medication regimen will improve Outcome: Progressing

## 2021-12-13 NOTE — Progress Notes (Signed)
Recreation Therapy Notes   Date: 12/13/2021  Time: 10:30 am    Location: Courtyard    Behavioral response: N/A   Intervention Topic: Wellness    Discussion/Intervention: Patient refused to attend group.   Clinical Observations/Feedback:  Patient refused to attend group.    Marily Konczal LRT/CTRS           Tanee Henery 12/13/2021 12:09 PM

## 2021-12-13 NOTE — Progress Notes (Signed)
Pt calm and pleasant during assessment denies SI/HI/AVH. Pt compliant with medication administration per MD orders. Pt give education and encouragement to be active in his treatment plan. Pt observed interacting appropriately with staff and peers on the unit. Pt being monitored Q 15 minutes for safety per unit protocol. Pt remains safe on the unit.  

## 2021-12-13 NOTE — Group Note (Signed)
[facility] LCSW Group Therapy Note   Group Date: 12/13/2021 Start Time: 1300 End Time: 1400   Type of Therapy/Topic:  Group Therapy:  Balance in Life  Participation Level:  Did Not Attend   Description of Group:    This group will address the concept of balance and how it feels and looks when one is unbalanced. Patients will be encouraged to process areas in their lives that are out of balance, and identify reasons for remaining unbalanced. Facilitators will guide patients utilizing problem- solving interventions to address and correct the stressor making their life unbalanced. Understanding and applying boundaries will be explored and addressed for obtaining  and maintaining a balanced life. Patients will be encouraged to explore ways to assertively make their unbalanced needs known to significant others in their lives, using other group members and facilitator for support and feedback.  Therapeutic Goals: Patient will identify two or more emotions or situations they have that consume much of in their lives. Patient will identify signs/triggers that life has become out of balance:  Patient will identify two ways to set boundaries in order to achieve balance in their lives:  Patient will demonstrate ability to communicate their needs through discussion and/or role plays  Summary of Patient Progress: X  Therapeutic Modalities:   Cognitive Behavioral Therapy Solution-Focused Therapy Assertiveness Training   Nakiya Rallis R Kennard Fildes, LCSW 

## 2021-12-13 NOTE — Plan of Care (Signed)
  Problem: Group Participation Goal: STG - Patient will engage in groups without prompting or encouragement from LRT x3 group sessions within 5 recreation therapy group sessions Description: STG - Patient will engage in groups without prompting or encouragement from LRT x3 group sessions within 5 recreation therapy group sessions Outcome: Not Progressing   

## 2021-12-13 NOTE — Progress Notes (Signed)
[facility] MD Progress Note  12/13/2021 2:19 PM [name] [name]  MRN:  [id number] Subjective: "I am fine".  Patient continues to minimize symptoms.  Judging by his behavior however he is getting more comfortable and less paranoid as he is coming out of his room Principal Problem: Schizophrenia (HCC) Diagnosis: Principal Problem:   Schizophrenia (HCC) Active Problems:   Hypertension  Total Time spent with patient: 20 minutes  Past Psychiatric History: Past history of schizophrenia  Past Medical History:  Past Medical History:  Diagnosis Date   Asthma    Depression    Psychosis (HCC)    Schizoaffective disorder (HCC)     Past Surgical History:  Procedure Laterality Date   BACK SURGERY     I&D groin  2017   Family History: History reviewed. No pertinent family history. Family Psychiatric  History: See previous Social History:  Social History   Substance and Sexual Activity  Alcohol Use No     Social History   Substance and Sexual Activity  Drug Use Never    Social History   Socioeconomic History   Marital status: Single    Spouse name: Not on file   Number of children: Not on file   Years of education: Not on file   Highest education level: Not on file  Occupational History   Not on file  Tobacco Use   Smoking status: Never   Smokeless tobacco: Never  Vaping Use   Vaping Use: Some days  Substance and Sexual Activity   Alcohol use: No   Drug use: Never   Sexual activity: Not Currently  Other Topics Concern   Not on file  Social History Narrative   Not on file   Social Determinants of Health   Financial Resource Strain: Not on file  Food Insecurity: Not on file  Transportation Needs: Not on file  Physical Activity: Not on file  Stress: Not on file  Social Connections: Not on file   Additional Social History:                         Sleep: Fair  Appetite:  Fair  Current Medications: Current Facility-Administered Medications  Medication  Dose Route Frequency Provider Last Rate Last Admin   acetaminophen (TYLENOL) tablet 650 mg  650 mg Oral Q6H PRN Vanetta MuldersBarthold, Louise F, NP       alum & mag hydroxide-simeth (MAALOX/MYLANTA) 200-200-20 MG/5ML suspension 30 mL  30 mL Oral Q4H PRN Gabriel CirriBarthold, Louise F, NP       cloZAPine (CLOZARIL) tablet 150 mg  150 mg Oral QHS Alayshia Marini T, MD   150 mg at 12/12/21 2107   divalproex (DEPAKOTE) DR tablet 500 mg  500 mg Oral Q12H Samarion Ehle T, MD   500 mg at 12/13/21 0810   LORazepam (ATIVAN) tablet 2 mg  2 mg Oral Q6H PRN Arihaan Bellucci, [name], MD       Or   LORazepam (ATIVAN) injection 2 mg  2 mg Intramuscular Q6H PRN Khylan Sawyer T, MD       magnesium hydroxide (MILK OF MAGNESIA) suspension 30 mL  30 mL Oral Daily PRN Vanetta MuldersBarthold, Louise F, NP        Lab Results:  Results for orders placed or performed during the hospital encounter of 12/04/21 (from the past 48 hour(s))  CBC with Differential/Platelet     Status: None   Collection Time: 12/13/21  9:09 AM  Result Value Ref Range   WBC  5.2 4.0 - 10.5 K/uL   RBC 5.09 4.22 - 5.81 MIL/uL   Hemoglobin 14.2 13.0 - 17.0 g/dL   HCT 37.6 28.3 - 15.1 %   MCV 81.7 80.0 - 100.0 fL   MCH 27.9 26.0 - 34.0 pg   MCHC 34.1 30.0 - 36.0 g/dL   RDW 76.1 60.7 - 37.1 %   Platelets 241 150 - 400 K/uL   nRBC 0.0 0.0 - 0.2 %   Neutrophils Relative % 50 %   Neutro Abs 2.6 1.7 - 7.7 K/uL   Lymphocytes Relative 42 %   Lymphs Abs 2.2 0.7 - 4.0 K/uL   Monocytes Relative 7 %   Monocytes Absolute 0.4 0.1 - 1.0 K/uL   Eosinophils Relative 0 %   Eosinophils Absolute 0.0 0.0 - 0.5 K/uL   Basophils Relative 0 %   Basophils Absolute 0.0 0.0 - 0.1 K/uL   Immature Granulocytes 1 %   Abs Immature Granulocytes 0.04 0.00 - 0.07 K/uL    Comment: Performed at [facility], [address] [place], [place] [address]    Blood Alcohol level:  Lab Results  Component Value Date   [facility] <10 12/03/2021   ETH <10 07/18/2020    Metabolic Disorder Labs: Lab Results   Component Value Date   HGBA1C 5.1 12/03/2021   MPG 99.67 12/03/2021   MPG 91.06 07/19/2020   No results found for: PROLACTIN Lab Results  Component Value Date   CHOL 197 12/06/2021   TRIG 111 12/06/2021   HDL 46 12/06/2021   CHOLHDL 4.3 12/06/2021   VLDL 22 12/06/2021   LDLCALC 129 (H) 12/06/2021   LDLCALC 177 (H) 07/19/2020    Physical Findings: AIMS:  , ,  ,  ,    CIWA:    COWS:     Musculoskeletal: Strength & Muscle Tone: within normal limits Gait & Station: normal Patient leans: N/A  Psychiatric Specialty Exam:  Presentation  General Appearance: Casual; Well Groomed  Eye Contact:Good  Speech:Slow  Speech Volume:Decreased  Handedness:Right   Mood and Affect  Mood:Anxious  Affect:Inappropriate; Constricted   Thought Process  Thought Processes:Disorganized  Descriptions of Associations:Circumstantial  Orientation:Full (Time, Place and Person)  Thought Content:Paranoid Ideation; Scattered; Illogical  History of Schizophrenia/Schizoaffective disorder:Yes  Duration of Psychotic Symptoms:Greater than six months  Hallucinations:No data recorded Ideas of Reference:Paranoia; Delusions  Suicidal Thoughts:No data recorded Homicidal Thoughts:No data recorded  Sensorium  Memory:Immediate Poor; Recent Poor; Remote Poor  Judgment:Poor  Insight:Poor   Executive Functions  Concentration:Fair  Attention Span:Poor  Recall:Poor  Fund of Knowledge:Poor  Language:Poor   Psychomotor Activity  Psychomotor Activity:No data recorded  Assets  Assets:Communication Skills; Desire for Improvement; Resilience; Social Support   Sleep  Sleep:No data recorded   Physical Exam: Physical Exam Vitals and nursing note reviewed.  Constitutional:      Appearance: Normal appearance.  HENT:     Head: Normocephalic and atraumatic.     Mouth/Throat:     Pharynx: Oropharynx is clear.  Eyes:     Pupils: Pupils are equal, round, and reactive to light.   Cardiovascular:     Rate and Rhythm: Normal rate and regular rhythm.  Pulmonary:     Effort: Pulmonary effort is normal.     Breath sounds: Normal breath sounds.  Abdominal:     General: Abdomen is flat.     Palpations: Abdomen is soft.  Musculoskeletal:        General: Normal range of motion.  Skin:    General:  Skin is warm and dry.  Neurological:     General: No focal deficit present.     Mental Status: He is alert. Mental status is at baseline.  Psychiatric:        Attention and Perception: He is inattentive.        Mood and Affect: Mood normal. Affect is blunt.        Speech: Speech is delayed.        Behavior: Behavior is withdrawn.        Thought Content: Thought content is paranoid.   Review of Systems  Constitutional: Negative.   HENT: Negative.    Eyes: Negative.   Respiratory: Negative.    Cardiovascular: Negative.   Gastrointestinal: Negative.   Musculoskeletal: Negative.   Skin: Negative.   Neurological: Negative.   Psychiatric/Behavioral: Negative.    Blood pressure (!) 127/91, pulse 96, temperature 98.1 F (36.7 C), temperature source Oral, resp. rate 18, height 6\' 1"  (1.854 m), weight 103.4 kg, SpO2 99 %. Body mass index is 30.08 kg/m.   Treatment Plan Summary: Plan continue with increasing clozapine.  Ongoing daily monitoring of symptoms and behavior  , MD 12/13/2021, 2:19 PM

## 2021-12-13 NOTE — Progress Notes (Addendum)
CLOZAPINE MONITORING   Clozapine 100 > 150 mg at bedtime ordered.  Date ANC 5/25 2600    Check ANC at least weekly while inpatient: next 12/20/21 or sooner    For general population patients, i.e., those without benign ethnic neutropenia (BEN): --If ANC 1000-1499, increase ANC monitoring to 3x/wk --If ANC < 1000, HOLD CLOZAPINE and get psych consult     For patients with BEN: --If ANC 500-999, increase ANC monitoring to 3x/wk --If ANC < 500, HOLD CLOZAPINE and get psych consult   REMS-certified psychiatry provider can continue drug with ANC below cited thresholds if they document medical opinion that the neutropenia is not clozapine-induced (heme consult is recommended) or that risk of interrupting therapy is greater than the risk of developing severe neutropenia.  Sharen Hones, PharmD, BCPS Clinical Pharmacist

## 2021-12-14 DIAGNOSIS — F203 Undifferentiated schizophrenia: Secondary | ICD-10-CM | POA: Diagnosis not present

## 2021-12-14 NOTE — Plan of Care (Signed)
D- Patient alert and oriented. Patient presented in a pleasant mood on assessment stating that he slept good last night and had no complaints to voice to this Clinical research associate. Patient denied SI, HI, AVH, and pain at this time. Patient also denied any signs/symptoms of depression and anxiety. Patient was saying "no" basically before this Clinical research associate finished asking him questions. This Clinical research associate believes that patient is only saying what he feels will get him discharged quicker, or what he thinks staff wants to hear in order to say he's progressing. Patient continues to come out of his room for meals and medication, but other than that, he stays in bed and sleeps all day. Patient had no stated goals for today.  A- Scheduled medications administered to patient, per MD orders. Support and encouragement provided.  Routine safety checks conducted every 15 minutes.  Patient informed to notify staff with problems or concerns.  R- No adverse drug reactions noted. Patient contracts for safety at this time. Patient compliant with medications and treatment plan. Patient receptive, calm, and cooperative. Patient remains safe at this time.  Problem: Education: Goal: Knowledge of [facility] Education information/materials will improve Outcome: Progressing Goal: Emotional status will improve Outcome: Progressing Goal: Mental status will improve Outcome: Progressing Goal: Verbalization of understanding the information provided will improve Outcome: Progressing   Problem: Health Behavior/Discharge Planning: Goal: Compliance with treatment plan for underlying cause of condition will improve Outcome: Progressing   Problem: Safety: Goal: Periods of time without injury will increase Outcome: Progressing   Problem: Self-Concept: Goal: Ability to identify factors that promote anxiety will improve Outcome: Progressing Goal: Level of anxiety will decrease Outcome: Progressing   Problem: Education: Goal: Will be free of  psychotic symptoms Outcome: Progressing   Problem: Health Behavior/Discharge Planning: Goal: Compliance with prescribed medication regimen will improve Outcome: Progressing

## 2021-12-14 NOTE — [facility] Counselor (Signed)
CSW met with the patient to discuss if patient would allow for CSW team to contact ACTT team regarding the patient's care.  Pt declined at this time.   Assunta Curtis, MSW, LCSW 12/14/2021 12:29 PM

## 2021-12-14 NOTE — Progress Notes (Signed)
[facility] MD Progress Note  12/14/2021 2:17 PM [name] [name]  MRN:  [id number] Subjective: Patient seen and chart reviewed.  No complaints.  Continues to spend virtually all of his time in bed.  When I ask him why he did that he said that he just felt like it was "a good way to pass the time.  He denies that he is having hallucinations or is paranoid.  Still seems very withdrawn and does not engage much in conversation.  Hygiene is not that great.  Takes his medicine though and looks like he may be showing a little improvement. Principal Problem: Schizophrenia (HCC) Diagnosis: Principal Problem:   Schizophrenia (HCC) Active Problems:   Hypertension  Total Time spent with patient: 30 minutes  Past Psychiatric History: Past history of schizophrenia note doing well really when only on clozapine along with other medicine  Past Medical History:  Past Medical History:  Diagnosis Date   Asthma    Depression    Psychosis (HCC)    Schizoaffective disorder (HCC)     Past Surgical History:  Procedure Laterality Date   BACK SURGERY     I&D groin  2017   Family History: History reviewed. No pertinent family history. Family Psychiatric  History: See previous Social History:  Social History   Substance and Sexual Activity  Alcohol Use No     Social History   Substance and Sexual Activity  Drug Use Never    Social History   Socioeconomic History   Marital status: Single    Spouse name: Not on file   Number of children: Not on file   Years of education: Not on file   Highest education level: Not on file  Occupational History   Not on file  Tobacco Use   Smoking status: Never   Smokeless tobacco: Never  Vaping Use   Vaping Use: Some days  Substance and Sexual Activity   Alcohol use: No   Drug use: Never   Sexual activity: Not Currently  Other Topics Concern   Not on file  Social History Narrative   Not on file   Social Determinants of Health   Financial Resource Strain:  Not on file  Food Insecurity: Not on file  Transportation Needs: Not on file  Physical Activity: Not on file  Stress: Not on file  Social Connections: Not on file   Additional Social History:                         Sleep: Fair  Appetite:  Fair  Current Medications: Current Facility-Administered Medications  Medication Dose Route Frequency Provider Last Rate Last Admin   acetaminophen (TYLENOL) tablet 650 mg  650 mg Oral Q6H PRN Vanetta Mulders, NP       alum & mag hydroxide-simeth (MAALOX/MYLANTA) 200-200-20 MG/5ML suspension 30 mL  30 mL Oral Q4H PRN Gabriel Cirri F, NP       cloZAPine (CLOZARIL) tablet 200 mg  200 mg Oral QHS [facility] T, MD   200 mg at 12/13/21 2147   divalproex (DEPAKOTE) DR tablet 500 mg  500 mg Oral Q12H Dany Harten T, MD   500 mg at 12/14/21 0802   LORazepam (ATIVAN) tablet 2 mg  2 mg Oral Q6H PRN Nikeisha Klutz, [name], MD       Or   LORazepam (ATIVAN) injection 2 mg  2 mg Intramuscular Q6H PRN Leialoha Hanna, [name], MD       magnesium hydroxide (  MILK OF MAGNESIA) suspension 30 mL  30 mL Oral Daily PRN Vanetta MuldersBarthold, Louise F, NP        Lab Results:  Results for orders placed or performed during the hospital encounter of 12/04/21 (from the past 48 hour(s))  CBC with Differential/Platelet     Status: None   Collection Time: 12/13/21  9:09 AM  Result Value Ref Range   WBC 5.2 4.0 - 10.5 K/uL   RBC 5.09 4.22 - 5.81 MIL/uL   Hemoglobin 14.2 13.0 - 17.0 g/dL   HCT 16.141.6 09.639.0 - 04.552.0 %   MCV 81.7 80.0 - 100.0 fL   MCH 27.9 26.0 - 34.0 pg   MCHC 34.1 30.0 - 36.0 g/dL   RDW 40.912.6 81.111.5 - 91.415.5 %   Platelets 241 150 - 400 K/uL   nRBC 0.0 0.0 - 0.2 %   Neutrophils Relative % 50 %   Neutro Abs 2.6 1.7 - 7.7 K/uL   Lymphocytes Relative 42 %   Lymphs Abs 2.2 0.7 - 4.0 K/uL   Monocytes Relative 7 %   Monocytes Absolute 0.4 0.1 - 1.0 K/uL   Eosinophils Relative 0 %   Eosinophils Absolute 0.0 0.0 - 0.5 K/uL   Basophils Relative 0 %   Basophils Absolute 0.0  0.0 - 0.1 K/uL   Immature Granulocytes 1 %   Abs Immature Granulocytes 0.04 0.00 - 0.07 K/uL    Comment: Performed at [facility], [address] [place], [place] [address]    Blood Alcohol level:  Lab Results  Component Value Date   [facility]   ETH <10 07/18/2020    Metabolic Disorder Labs: Lab Results  Component Value Date   HGBA1C 5.1 12/03/2021   MPG 99.67 12/03/2021   MPG 91.06 07/19/2020   No results found for: PROLACTIN Lab Results  Component Value Date   CHOL 197 12/06/2021   TRIG 111 12/06/2021   HDL 46 12/06/2021   CHOLHDL 4.3 12/06/2021   VLDL 22 12/06/2021   LDLCALC 129 (H) 12/06/2021   LDLCALC 177 (H) 07/19/2020    Physical Findings: AIMS:  , ,  ,  ,    CIWA:    COWS:     Musculoskeletal: Strength & Muscle Tone: within normal limits Gait & Station: normal Patient leans: N/A  Psychiatric Specialty Exam:  Presentation  General Appearance: Casual; Well Groomed  Eye Contact:Good  Speech:Slow  Speech Volume:Decreased  Handedness:Right   Mood and Affect  Mood:Anxious  Affect:Inappropriate; Constricted   Thought Process  Thought Processes:Disorganized  Descriptions of Associations:Circumstantial  Orientation:Full (Time, Place and Person)  Thought Content:Paranoid Ideation; Scattered; Illogical  History of Schizophrenia/Schizoaffective disorder:Yes  Duration of Psychotic Symptoms:Greater than six months  Hallucinations:No data recorded Ideas of Reference:Paranoia; Delusions  Suicidal Thoughts:No data recorded Homicidal Thoughts:No data recorded  Sensorium  Memory:Immediate Poor; Recent Poor; Remote Poor  Judgment:Poor  Insight:Poor   Executive Functions  Concentration:Fair  Attention Span:Poor  Recall:Poor  Fund of Knowledge:Poor  Language:Poor   Psychomotor Activity  Psychomotor Activity:No data recorded  Assets  Assets:Communication Skills; Desire for Improvement; Resilience; Social  Support   Sleep  Sleep:No data recorded   Physical Exam: Physical Exam Vitals and nursing note reviewed.  Constitutional:      Appearance: Normal appearance.  HENT:     Head: Normocephalic and atraumatic.     Mouth/Throat:     Pharynx: Oropharynx is clear.  Eyes:     Pupils: Pupils are equal, round, and reactive to light.  Cardiovascular:  Rate and Rhythm: Normal rate and regular rhythm.  Pulmonary:     Effort: Pulmonary effort is normal.     Breath sounds: Normal breath sounds.  Abdominal:     General: Abdomen is flat.     Palpations: Abdomen is soft.  Musculoskeletal:        General: Normal range of motion.  Skin:    General: Skin is warm and dry.  Neurological:     General: No focal deficit present.     Mental Status: He is alert. Mental status is at baseline.  Psychiatric:        Attention and Perception: He is inattentive.        Mood and Affect: Mood normal. Affect is blunt.        Speech: Speech is delayed.        Behavior: Behavior is withdrawn.        Thought Content: Thought content normal.   Review of Systems  Constitutional: Negative.   HENT: Negative.    Eyes: Negative.   Respiratory: Negative.    Cardiovascular: Negative.   Gastrointestinal: Negative.   Musculoskeletal: Negative.   Skin: Negative.   Neurological: Negative.   Psychiatric/Behavioral: Negative.    Blood pressure (!) 127/91, pulse 96, temperature 98.1 F (36.7 C), temperature source Oral, resp. rate 18, height 6\' 1"  (1.854 m), weight 103.4 kg, SpO2 99 %. Body mass index is 30.08 kg/m.   Treatment Plan Summary: Medication management and Plan just increased clozapine dose.  No change to meds today.  As always encouraged him to be up and out of bed interacting with and also to be speaking to his family.  [place], MD 12/14/2021, 2:17 PM

## 2021-12-14 NOTE — [facility] Counselor (Signed)
CSW spoke with the patient. Patient declined for CSW to speak with [facility].  CSW updated [name] with [name] that at this time pt is declining for CSW to speak with [name].   [name] did disclose that patient is "facing felony charges for arson on his apartment".  She reports that legally patient will need to have some evidence of treatment and medications.  CSW reports she will follow up.  Penni Homans, MSW, LCSW 12/14/2021 4:26 PM

## 2021-12-14 NOTE — Group Note (Signed)
[facility] LCSW Group Therapy Note   Group Date: 12/14/2021 Start Time: 1300 End Time: 1400  Type of Therapy and Topic:  Group Therapy:  Feelings around Relapse and Recovery  Participation Level:  Did Not Attend   Mood:  Description of Group:    Patients in this group will discuss emotions they experience before and after a relapse. They will process how experiencing these feelings, or avoidance of experiencing them, relates to having a relapse. Facilitator will guide patients to explore emotions they have related to recovery. Patients will be encouraged to process which emotions are more powerful. They will be guided to discuss the emotional reaction significant others in their lives may have to patients' relapse or recovery. Patients will be assisted in exploring ways to respond to the emotions of others without this contributing to a relapse.  Therapeutic Goals: Patient will identify two or more emotions that lead to relapse for them:  Patient will identify two emotions that result when they relapse:  Patient will identify two emotions related to recovery:  Patient will demonstrate ability to communicate their needs through discussion and/or role plays.   Summary of Patient Progress:  X   Therapeutic Modalities:   Cognitive Behavioral Therapy Solution-Focused Therapy Assertiveness Training Relapse Prevention Therapy   Teondra [place]burg J Tonio Seider, LCSW 

## 2021-12-14 NOTE — Progress Notes (Signed)
Patient did not go outside with staff and the other members on the unit. He continues to lay in bed and sleep.

## 2021-12-14 NOTE — Progress Notes (Signed)
Patient has been in bed most of the morning, since breakfast. He is now up, down in the dayroom for lunch. Patient remains safe on the unit.

## 2021-12-15 DIAGNOSIS — F201 Disorganized schizophrenia: Secondary | ICD-10-CM | POA: Diagnosis not present

## 2021-12-15 NOTE — BH IP Treatment Plan (Addendum)
Interdisciplinary Treatment and Diagnostic Plan Update  12/15/2021 Time of Session: 8:45AM [name] [name] MRN: [id number]  Principal Diagnosis: Schizophrenia [facility](HCC)  Secondary Diagnoses: Principal Problem:   Schizophrenia (HCC) Active Problems:   Hypertension   Current Medications:  Current Facility-Administered Medications  Medication Dose Route Frequency Provider Last Rate Last Admin   acetaminophen (TYLENOL) tablet 650 mg  650 mg Oral Q6H PRN Vanetta MuldersBarthold, Louise F, NP       alum & mag hydroxide-simeth (MAALOX/MYLANTA) 200-200-20 MG/5ML suspension 30 mL  30 mL Oral Q4H PRN Vanetta MuldersBarthold, Louise F, NP       cloZAPine (CLOZARIL) tablet 200 mg  200 mg Oral QHS [name], [name], MD   200 mg at 12/14/21 2147   divalproex (DEPAKOTE) DR tablet 500 mg  500 mg Oral Q12H [name], [name], MD   500 mg at 12/15/21 [id number]   LORazepam (ATIVAN) tablet 2 mg  2 mg Oral Q6H PRN [name], [name], MD   2 mg at 12/14/21 2147   Or   LORazepam (ATIVAN) injection 2 mg  2 mg Intramuscular Q6H PRN [name], [name], MD       magnesium hydroxide (MILK OF MAGNESIA) suspension 30 mL  30 mL Oral Daily PRN Vanetta MuldersBarthold, Louise F, NP       PTA Medications: Medications Prior to Admission  Medication Sig Dispense Refill Last Dose   divalproex (DEPAKOTE) 500 MG DR tablet Take 1 tablet (500 mg total) by mouth every 12 (twelve) hours. 60 tablet 0 11/30/2021   amLODipine (NORVASC) 10 MG tablet Take 1 tablet (10 mg total) by mouth daily. (Patient not taking: Reported on 12/04/2021) 30 tablet 0 Not Taking   cloZAPine (CLOZARIL) 50 MG tablet Take 1 tablet (50 mg total) by mouth daily. (Patient not taking: Reported on 12/04/2021) 30 tablet 0 Not Taking   cloZAPine (CLOZARIL) 50 MG tablet Take 5 tablets (250 mg total) by mouth at bedtime. (Patient not taking: Reported on 12/04/2021) 30 tablet 0 Not Taking   glycopyrrolate (ROBINUL) 2 MG tablet Take by mouth. (Patient not taking: Reported on 12/04/2021)   Not Taking   paliperidone (INVEGA  SUSTENNA) 234 MG/1.5ML SUSY injection Inject 234 mg into the muscle every 28 (twenty-eight) days. (Patient not taking: Reported on 12/04/2021) 1.5 mL 3 Not Taking   paliperidone (INVEGA SUSTENNA) 234 MG/1.5ML SUSY injection INJECT AT FACILITY 1.5 mL 0    temazepam (RESTORIL) 15 MG capsule Take 1 capsule (15 mg total) by mouth at bedtime as needed for sleep. (Patient not taking: Reported on 12/04/2021) 30 capsule 0 Not Taking    Patient Stressors: Marital or family conflict   Medication change or noncompliance    Patient Strengths: General fund of knowledge  Supportive family/friends   Treatment Modalities: Medication Management, Group therapy, Case management,  1 to 1 session with clinician, Psychoeducation, Recreational therapy.   Physician Treatment Plan for Primary Diagnosis: Schizophrenia (HCC) Long Term Goal(s): Improvement in symptoms so as ready for discharge   Short Term Goals: Ability to maintain clinical measurements within normal limits will improve Compliance with prescribed medications will improve Ability to verbalize feelings will improve Ability to demonstrate self-control will improve Ability to identify and develop effective coping behaviors will improve  Medication Management: Evaluate patient's response, side effects, and tolerance of medication regimen.  Therapeutic Interventions: 1 to 1 sessions, Unit Group sessions and Medication administration.  Evaluation of Outcomes: Progressing  Physician Treatment Plan for Secondary Diagnosis: Principal Problem:   Schizophrenia (HCC) Active Problems:   Hypertension  Long Term Goal(s): Improvement in symptoms so as ready for discharge   Short Term Goals: Ability to maintain clinical measurements within normal limits will improve Compliance with prescribed medications will improve Ability to verbalize feelings will improve Ability to demonstrate self-control will improve Ability to identify and develop effective coping  behaviors will improve     Medication Management: Evaluate patient's response, side effects, and tolerance of medication regimen.  Therapeutic Interventions: 1 to 1 sessions, Unit Group sessions and Medication administration.  Evaluation of Outcomes: Progressing   RN Treatment Plan for Primary Diagnosis: Schizophrenia (HCC) Long Term Goal(s): Knowledge of disease and therapeutic regimen to maintain health will improve  Short Term Goals: Ability to demonstrate self-control, Ability to participate in decision making will improve, Ability to verbalize feelings will improve, Ability to disclose and discuss suicidal ideas, Ability to identify and develop effective coping behaviors will improve, and Compliance with prescribed medications will improve  Medication Management: RN will administer medications as ordered by provider, will assess and evaluate patient's response and provide education to patient for prescribed medication. RN will report any adverse and/or side effects to prescribing provider.  Therapeutic Interventions: 1 on 1 counseling sessions, Psychoeducation, Medication administration, Evaluate responses to treatment, Monitor vital signs and CBGs as ordered, Perform/monitor CIWA, COWS, AIMS and Fall Risk screenings as ordered, Perform wound care treatments as ordered.  Evaluation of Outcomes: Progressing   LCSW Treatment Plan for Primary Diagnosis: Schizophrenia (HCC) Long Term Goal(s): Safe transition to appropriate next level of care at discharge, Engage patient in therapeutic group addressing interpersonal concerns.  Short Term Goals: Engage patient in aftercare planning with referrals and resources, Increase social support, Increase ability to appropriately verbalize feelings, Increase emotional regulation, Facilitate acceptance of mental health diagnosis and concerns, and Increase skills for wellness and recovery  Therapeutic Interventions: Assess for all discharge needs, 1 to 1  time with Social worker, Explore available resources and support systems, Assess for adequacy in community support network, Educate family and significant other(s) on suicide prevention, Complete Psychosocial Assessment, Interpersonal group therapy.  Evaluation of Outcomes: Progressing   Progress in Treatment: Attending groups: No. Participating in groups: No. Taking medication as prescribed: Yes. Toleration medication: Yes. Family/Significant other contact made: No, will contact:  Patient declined consent for CSW to contact family/friends. SPE completed with patient. Patient understands diagnosis: Yes. Discussing patient identified problems/goals with staff: Yes. Medical problems stabilized or resolved: Yes. Denies suicidal/homicidal ideation: Yes. Issues/concerns per patient self-inventory: No. Other: None.  New problem(s) identified: No, Describe:  None.  New Short Term/Long Term Goal(s): elimination of symptoms of psychosis, medication management for mood stabilization; elimination of SI thoughts; development of comprehensive mental wellness plan. Update 12/10/21: No changes at this time. Update 12/15/2021: No changes at this time.   Patient Goals:  "learning to think about things clearly and focus, imagine things clearly" Update 12/10/21: No changes at this time. Update 12/15/2021: No changes at this time.   Discharge Plan or Barriers: CSW will assist patient with developing appropriate discharge plans. Update 12/10/21: No changes at this time.  Update 12/15/2021: No changes at this time.     Reason for Continuation of Hospitalization: Anxiety Depression Medication stabilization  Estimated Length of Stay: TBD  Last 3 [place] Suicide Severity Risk Score: Flowsheet Row Admission (Current) from 12/04/2021 in [facility]PATIENT BE[facility] ED from [facility] (Dis[facility]B  C-SSRS RISK CATEGORY No Risk No  Risk No Risk       Last PHQ 2/9 Scores:     View : No data to display.          Scribe for Treatment Team: Marletta Lor 12/15/2021 8:48 AM

## 2021-12-15 NOTE — Progress Notes (Signed)
Patient alert and oriented x 4, affect is flat but brightens upon approach, he appears less anxious, no paranoia noted , she is interacting appropriately with pees and staff. 15 minutes safety checks maintained will continue to monitor.

## 2021-12-15 NOTE — Progress Notes (Signed)
D: Patient alert and oriented. Patient denies pain. Patient denies anxiety and depression. Patient denies SI/HI/AVH. Patient isolative to room and self during shift.   A: Scheduled medications administered to patient, per MD orders. Support and encouragement provided to patient.  Q15 minute safety checks maintained.   R: Patient compliant with medication administration and treatment plan. No adverse drug reactions noted. Patient remains safe on the unit at this time.

## 2021-12-15 NOTE — Group Note (Signed)
LCSW Group Therapy Note  Group Date: 12/15/2021 Start Time: 1330 End Time: 1430   Type of Therapy and Topic:  Group Therapy - Healthy vs Unhealthy Coping Skills  Participation Level:  Did Not Attend   Description of Group The focus of this group was to determine what unhealthy coping techniques typically are used by group members and what healthy coping techniques would be helpful in coping with various problems. Patients were guided in becoming aware of the differences between healthy and unhealthy coping techniques. Patients were asked to identify 2-3 healthy coping skills they would like to learn to use more effectively.  Therapeutic Goals Patients learned that coping is what human beings do all day long to deal with various situations in their lives Patients defined and discussed healthy vs unhealthy coping techniques Patients identified their preferred coping techniques and identified whether these were healthy or unhealthy Patients determined 2-3 healthy coping skills they would like to become more familiar with and use more often. Patients provided support and ideas to each other   Summary of Patient Progress: Patient did not attend group despite encouraged participation.    Therapeutic Modalities Cognitive Behavioral Therapy Motivational Interviewing  Norberto Sorenson, Theresia Majors 12/15/2021  5:13 PM

## 2021-12-15 NOTE — Progress Notes (Signed)
[facility] MD Progress Note  12/15/2021 12:01 PM [name] [name]  MRN:  [id number] Subjective: Patient is seen on rounds and does not have any complaints.  He has been compliant with his Clozaril.  He denies any side effects.  No issues.  Principal Problem: Schizophrenia (HCC) Diagnosis: Principal Problem:   Schizophrenia (HCC) Active Problems:   Hypertension  Total Time spent with patient: 15 minutes  Past Psychiatric History:  Past history of schizophrenia note doing well really when only on clozapine along with other medicine  Past Medical History:  Past Medical History:  Diagnosis Date   Asthma    Depression    Psychosis (HCC)    Schizoaffective disorder (HCC)     Past Surgical History:  Procedure Laterality Date   BACK SURGERY     I&D groin  2017   Family History: History reviewed. No pertinent family history.  Social History:  Social History   Substance and Sexual Activity  Alcohol Use No     Social History   Substance and Sexual Activity  Drug Use Never    Social History   Socioeconomic History   Marital status: Single    Spouse name: Not on file   Number of children: Not on file   Years of education: Not on file   Highest education level: Not on file  Occupational History   Not on file  Tobacco Use   Smoking status: Never   Smokeless tobacco: Never  Vaping Use   Vaping Use: Some days  Substance and Sexual Activity   Alcohol use: No   Drug use: Never   Sexual activity: Not Currently  Other Topics Concern   Not on file  Social History Narrative   Not on file   Social Determinants of Health   Financial Resource Strain: Not on file  Food Insecurity: Not on file  Transportation Needs: Not on file  Physical Activity: Not on file  Stress: Not on file  Social Connections: Not on file   Additional Social History:                         Sleep: Good  Appetite:  Good  Current Medications: Current Facility-Administered Medications   Medication Dose Route Frequency Provider Last Rate Last Admin   acetaminophen (TYLENOL) tablet 650 mg  650 mg Oral Q6H PRN Vanetta Mulders, NP       alum & mag hydroxide-simeth (MAALOX/MYLANTA) 200-200-20 MG/5ML suspension 30 mL  30 mL Oral Q4H PRN Gabriel Cirri F, NP       cloZAPine (CLOZARIL) tablet 200 mg  200 mg Oral QHS [name], [name], MD   200 mg at 12/14/21 2147   divalproex (DEPAKOTE) DR tablet 500 mg  500 mg Oral Q12H [name], [name], MD   500 mg at 12/15/21 4431   LORazepam (ATIVAN) tablet 2 mg  2 mg Oral Q6H PRN [name], [name], MD   2 mg at 12/14/21 2147   Or   LORazepam (ATIVAN) injection 2 mg  2 mg Intramuscular Q6H PRN [name], [name], MD       magnesium hydroxide (MILK OF MAGNESIA) suspension 30 mL  30 mL Oral Daily PRN Gabriel Cirri F, NP        Lab Results: No results found for this or any previous visit (from the past 48 hour(s)).  Blood Alcohol level:  Lab Results  Component Value Date   ETH <10 12/03/2021   ETH <10 07/18/2020  Metabolic Disorder Labs: Lab Results  Component Value Date   HGBA1C 5.1 12/03/2021   MPG 99.67 12/03/2021   MPG 91.06 07/19/2020   No results found for: PROLACTIN Lab Results  Component Value Date   CHOL 197 12/06/2021   TRIG 111 12/06/2021   HDL 46 12/06/2021   CHOLHDL 4.3 12/06/2021   VLDL 22 12/06/2021   LDLCALC 129 (H) 12/06/2021   LDLCALC 177 (H) 07/19/2020    Physical Findings: AIMS:  , ,  ,  ,    CIWA:    COWS:     Musculoskeletal: Strength & Muscle Tone: within normal limits Gait & Station: normal Patient leans: N/A  Psychiatric Specialty Exam:  Presentation  General Appearance: Casual; Well Groomed  Eye Contact:Good  Speech:Slow  Speech Volume:Decreased  Handedness:Right   Mood and Affect  Mood:Anxious  Affect:Inappropriate; Constricted   Thought Process  Thought Processes:Disorganized  Descriptions of Associations:Circumstantial  Orientation:Full (Time, Place and  Person)  Thought Content:Paranoid Ideation; Scattered; Illogical  History of Schizophrenia/Schizoaffective disorder:Yes  Duration of Psychotic Symptoms:Greater than six months  Hallucinations:No data recorded Ideas of Reference:Paranoia; Delusions  Suicidal Thoughts:No data recorded Homicidal Thoughts:No data recorded  Sensorium  Memory:Immediate Poor; Recent Poor; Remote Poor  Judgment:Poor  Insight:Poor   Executive Functions  Concentration:Fair  Attention Span:Poor  Recall:Poor  Fund of Knowledge:Poor  Language:Poor   Psychomotor Activity  Psychomotor Activity:No data recorded  Assets  Assets:Communication Skills; Desire for Improvement; Resilience; Social Support   Sleep  Sleep:No data recorded   Physical Exam: Physical Exam Vitals and nursing note reviewed.  Constitutional:      Appearance: Normal appearance. He is normal weight.  Neurological:     General: No focal deficit present.     Mental Status: He is alert and oriented to person, place, and time.  Psychiatric:        Mood and Affect: Mood normal.        Behavior: Behavior normal.   Review of Systems  Constitutional: Negative.   HENT: Negative.    Eyes: Negative.   Respiratory: Negative.    Cardiovascular: Negative.   Gastrointestinal: Negative.   Genitourinary: Negative.   Musculoskeletal: Negative.   Skin: Negative.   Neurological: Negative.   Endo/Heme/Allergies: Negative.   Psychiatric/Behavioral: Negative.    Blood pressure (!) 127/91, pulse 96, temperature 98.1 F (36.7 C), temperature source Oral, resp. rate 18, height 6\' 1"  (1.854 m), weight 103.4 kg, SpO2 99 %. Body mass index is 30.08 kg/m.   Treatment Plan Summary: Daily contact with patient to assess and evaluate symptoms and progress in treatment, Medication management, and Plan continue current medications.  , DO 12/15/2021, 12:01 PM

## 2021-12-15 NOTE — Plan of Care (Signed)
  Problem: Education: Goal: Knowledge of [facility] Education information/materials will improve Outcome: Progressing Goal: Verbalization of understanding the information provided will improve Outcome: Progressing   Problem: Health Behavior/Discharge Planning: Goal: Compliance with treatment plan for underlying cause of condition will improve Outcome: Progressing   Problem: Safety: Goal: Periods of time without injury will increase Outcome: Progressing   Problem: Self-Concept: Goal: Level of anxiety will decrease Outcome: Progressing   Problem: Health Behavior/Discharge Planning: Goal: Compliance with prescribed medication regimen will improve Outcome: Progressing   

## 2021-12-16 DIAGNOSIS — F201 Disorganized schizophrenia: Secondary | ICD-10-CM | POA: Diagnosis not present

## 2021-12-16 NOTE — Progress Notes (Signed)
Patient alert and oriented x 4, affect is flat but brightens upon approach, he appears less anxious, no paranoia noted , she is interacting appropriately with pees and staff. 15 minutes safety checks maintained will continue to monitor.  

## 2021-12-16 NOTE — Progress Notes (Signed)
[facility] MD Progress Note  12/16/2021 11:55 AM [name] [name]  MRN:  [id number] Subjective:   Patient is seen on rounds and does not have any complaints.  He has been compliant with his Clozaril.  He denies any side effects.  No issues.  Principal Problem: Schizophrenia ([place]) Diagnosis: Principal Problem:   Schizophrenia ([place]) Active Problems:   Hypertension  Total Time spent with patient: 15 minutes  Past Psychiatric History:  Past history of schizophrenia note doing well really when only on clozapine along with other medicine    Past Medical History:  Past Medical History:  Diagnosis Date   Asthma    Depression    Psychosis ([place])    Schizoaffective disorder ([place])     Past Surgical History:  Procedure Laterality Date   BACK SURGERY     I&D groin  2017   Family History: History reviewed. No pertinent family history.  Social History:  Social History   Substance and Sexual Activity  Alcohol Use No     Social History   Substance and Sexual Activity  Drug Use Never    Social History   Socioeconomic History   Marital status: Single    Spouse name: Not on file   Number of children: Not on file   Years of education: Not on file   Highest education level: Not on file  Occupational History   Not on file  Tobacco Use   Smoking status: Never   Smokeless tobacco: Never  Vaping Use   Vaping Use: Some days  Substance and Sexual Activity   Alcohol use: No   Drug use: Never   Sexual activity: Not Currently  Other Topics Concern   Not on file  Social History Narrative   Not on file   Social Determinants of Health   Financial Resource Strain: Not on file  Food Insecurity: Not on file  Transportation Needs: Not on file  Physical Activity: Not on file  Stress: Not on file  Social Connections: Not on file   Additional Social History:                         Sleep: Good  Appetite:  Good  Current Medications: Current Facility-Administered Medications   Medication Dose Route Frequency Provider Last Rate Last Admin   acetaminophen (TYLENOL) tablet 650 mg  650 mg Oral Q6H PRN Sherlon Handing, NP       alum & mag hydroxide-simeth (MAALOX/MYLANTA) 200-200-20 MG/5ML suspension 30 mL  30 mL Oral Q4H PRN Waldon Merl F, NP       cloZAPine (CLOZARIL) tablet 200 mg  200 mg Oral QHS [name], [name], MD   200 mg at 12/15/21 2216   divalproex (DEPAKOTE) DR tablet 500 mg  500 mg Oral Q12H [name], [name], MD   500 mg at 12/16/21 0800   LORazepam (ATIVAN) tablet 2 mg  2 mg Oral Q6H PRN [name], [name], MD   2 mg at 12/14/21 2147   Or   LORazepam (ATIVAN) injection 2 mg  2 mg Intramuscular Q6H PRN [name], [name], MD       magnesium hydroxide (MILK OF MAGNESIA) suspension 30 mL  30 mL Oral Daily PRN Waldon Merl F, NP        Lab Results: No results found for this or any previous visit (from the past 48 hour(s)).  Blood Alcohol level:  Lab Results  Component Value Date   ETH <10 12/03/2021  ETH <10 [id number]    Metabolic Disorder Labs: Lab Results  Component Value Date   HGBA1C 5.1 12/03/2021   MPG 99.67 12/03/2021   MPG 91.06 07/19/2020   No results found for: PROLACTIN Lab Results  Component Value Date   CHOL 197 12/06/2021   TRIG 111 12/06/2021   HDL 46 12/06/2021   CHOLHDL 4.3 12/06/2021   VLDL 22 12/06/2021   LDLCALC 129 (H) 12/06/2021   LDLCALC 177 (H) 07/19/2020    Physical Findings: AIMS:  , ,  ,  ,    CIWA:    COWS:     Musculoskeletal: Strength & Muscle Tone: within normal limits Gait & Station: normal Patient leans: N/A  Psychiatric Specialty Exam:  Presentation  General Appearance: Casual; Well Groomed  Eye Contact:Good  Speech:Slow  Speech Volume:Decreased  Handedness:Right   Mood and Affect  Mood:Anxious  Affect:Inappropriate; Constricted   Thought Process  Thought Processes:Disorganized  Descriptions of Associations:Circumstantial  Orientation:Full (Time, Place and  Person)  Thought Content:Paranoid Ideation; Scattered; Illogical  History of Schizophrenia/Schizoaffective disorder:Yes  Duration of Psychotic Symptoms:Greater than six months  Hallucinations:No data recorded Ideas of Reference:Paranoia; Delusions  Suicidal Thoughts:No data recorded Homicidal Thoughts:No data recorded  Sensorium  Memory:Immediate Poor; Recent Poor; Remote Poor  Judgment:Poor  Insight:Poor   Executive Functions  Concentration:Fair  Attention Span:Poor  Recall:Poor  Fund of Knowledge:Poor  Language:Poor   Psychomotor Activity  Psychomotor Activity:No data recorded  Assets  Assets:Communication Skills; Desire for Improvement; Resilience; Social Support   Sleep  Sleep:No data recorded   Physical Exam: Physical Exam Vitals and nursing note reviewed.  Constitutional:      Appearance: Normal appearance. He is normal weight.  Neurological:     General: No focal deficit present.     Mental Status: He is alert and oriented to person, place, and time.  Psychiatric:        Mood and Affect: Mood normal.        Behavior: Behavior normal.   Review of Systems  Constitutional: Negative.   HENT: Negative.    Eyes: Negative.   Respiratory: Negative.    Cardiovascular: Negative.   Gastrointestinal: Negative.   Genitourinary: Negative.   Musculoskeletal: Negative.   Skin: Negative.   Neurological: Negative.   Endo/Heme/Allergies: Negative.   Psychiatric/Behavioral: Negative.    Blood pressure (!) 127/91, pulse 96, temperature 98.1 F (36.7 C), temperature source Oral, resp. rate 18, height 6\' 1"  (1.854 m), weight 103.4 kg, SpO2 99 %. Body mass index is 30.08 kg/m.   Treatment Plan Summary: Daily contact with patient to assess and evaluate symptoms and progress in treatment, Medication management, and Plan continue current medications.  Parks Ranger, DO 12/16/2021, 11:55 AM

## 2021-12-16 NOTE — Plan of Care (Signed)
Pt denies SI / Hi / AVH. No signs of distress or injury. Pt is interviewed in medication room. Pt is adherent with scheduled medications. Staff will continue to safety rounding.    Problem: Education: Goal: Knowledge of [facility] Education information/materials will improve Outcome: Progressing Goal: Emotional status will improve Outcome: Progressing Goal: Mental status will improve Outcome: Progressing

## 2021-12-17 DIAGNOSIS — F201 Disorganized schizophrenia: Secondary | ICD-10-CM | POA: Diagnosis not present

## 2021-12-17 NOTE — Group Note (Signed)
[facility] LCSW Group Therapy Note    Group Date: 12/17/2021 Start Time: 1250 End Time: 1350  Type of Therapy and Topic:  Group Therapy:  Overcoming Obstacles  Participation Level:  [facility] PARTICIPATION LEVEL: Did Not Attend  Mood:  Description of Group:   In this group patients will be encouraged to explore what they see as obstacles to their own wellness and recovery. They will be guided to discuss their thoughts, feelings, and behaviors related to these obstacles. The group will process together ways to cope with barriers, with attention given to specific choices patients can make. Each patient will be challenged to identify changes they are motivated to make in order to overcome their obstacles. This group will be process-oriented, with patients participating in exploration of their own experiences as well as giving and receiving support and challenge from other group members.  Therapeutic Goals: 1. Patient will identify personal and current obstacles as they relate to admission. 2. Patient will identify barriers that currently interfere with their wellness or overcoming obstacles.  3. Patient will identify feelings, thought process and behaviors related to these barriers. 4. Patient will identify two changes they are willing to make to overcome these obstacles:    Summary of Patient Progress   X   Therapeutic Modalities:   Cognitive Behavioral Therapy Solution Focused Therapy Motivational Interviewing Relapse Prevention Therapy   Niclas Markell J Miangel Flom, LCSW 

## 2021-12-17 NOTE — Plan of Care (Signed)
  Problem: Education: Goal: Knowledge of [place] [facility] Education information/materials will improve Outcome: Progressing Goal: Verbalization of understanding the information provided will improve Outcome: Progressing   Problem: Health Behavior/Discharge Planning: Goal: Compliance with treatment plan for underlying cause of condition will improve Outcome: Progressing   Problem: Safety: Goal: Periods of time without injury will increase Outcome: Progressing   Problem: Self-Concept: Goal: Level of anxiety will decrease Outcome: Progressing   Problem: Health Behavior/Discharge Planning: Goal: Compliance with prescribed medication regimen will improve Outcome: Progressing

## 2021-12-17 NOTE — Progress Notes (Signed)
[facility] MD Progress Note  12/17/2021 11:12 AM [name] [name]  MRN:  [id number] Subjective:  Patient is seen on rounds and does not have any complaints.  He has been compliant with his Clozaril.  He denies any side effects.  No issues.  Principal Problem: Schizophrenia (HCC) Diagnosis: Principal Problem:   Schizophrenia (HCC) Active Problems:   Hypertension  Total Time spent with patient: 15 minutes  Past Psychiatric History: Past history of schizophrenia note doing well really when only on clozapine along with other medicine  Past Medical History:  Past Medical History:  Diagnosis Date   Asthma    Depression    Psychosis (HCC)    Schizoaffective disorder (HCC)     Past Surgical History:  Procedure Laterality Date   BACK SURGERY     I&D groin  2017   Family History: History reviewed. No pertinent family history.  Social History:  Social History   Substance and Sexual Activity  Alcohol Use No     Social History   Substance and Sexual Activity  Drug Use Never    Social History   Socioeconomic History   Marital status: Single    Spouse name: Not on file   Number of children: Not on file   Years of education: Not on file   Highest education level: Not on file  Occupational History   Not on file  Tobacco Use   Smoking status: Never   Smokeless tobacco: Never  Vaping Use   Vaping Use: Some days  Substance and Sexual Activity   Alcohol use: No   Drug use: Never   Sexual activity: Not Currently  Other Topics Concern   Not on file  Social History Narrative   Not on file   Social Determinants of Health   Financial Resource Strain: Not on file  Food Insecurity: Not on file  Transportation Needs: Not on file  Physical Activity: Not on file  Stress: Not on file  Social Connections: Not on file   Additional Social History:                         Sleep: Good  Appetite:  Good  Current Medications: Current Facility-Administered Medications   Medication Dose Route Frequency Provider Last Rate Last Admin   acetaminophen (TYLENOL) tablet 650 mg  650 mg Oral Q6H PRN Vanetta Mulders, NP       alum & mag hydroxide-simeth (MAALOX/MYLANTA) 200-200-20 MG/5ML suspension 30 mL  30 mL Oral Q4H PRN Gabriel Cirri F, NP   30 mL at 12/17/21 0541   cloZAPine (CLOZARIL) tablet 200 mg  200 mg Oral QHS [name], [name], MD   200 mg at 12/16/21 2120   divalproex (DEPAKOTE) DR tablet 500 mg  500 mg Oral Q12H [name], [name], MD   500 mg at 12/17/21 0835   LORazepam (ATIVAN) tablet 2 mg  2 mg Oral Q6H PRN [name], [name], MD   2 mg at 12/14/21 2147   Or   LORazepam (ATIVAN) injection 2 mg  2 mg Intramuscular Q6H PRN [name], [name], MD       magnesium hydroxide (MILK OF MAGNESIA) suspension 30 mL  30 mL Oral Daily PRN Gabriel Cirri F, NP        Lab Results: No results found for this or any previous visit (from the past 48 hour(s)).  Blood Alcohol level:  Lab Results  Component Value Date   ETH <10 12/03/2021  ETH <10 07/18/2020    Metabolic Disorder Labs: Lab Results  Component Value Date   HGBA1C 5.1 12/03/2021   MPG 99.67 12/03/2021   MPG 91.06 07/19/2020   No results found for: PROLACTIN Lab Results  Component Value Date   CHOL 197 12/06/2021   TRIG 111 12/06/2021   HDL 46 12/06/2021   CHOLHDL 4.3 12/06/2021   VLDL 22 12/06/2021   LDLCALC 129 (H) 12/06/2021   LDLCALC 177 (H) 07/19/2020    Physical Findings: AIMS:  , ,  ,  ,    CIWA:    COWS:     Musculoskeletal: Strength & Muscle Tone: within normal limits Gait & Station: normal Patient leans: N/A  Psychiatric Specialty Exam:  Presentation  General Appearance: Casual; Well Groomed  Eye Contact:Good  Speech:Slow  Speech Volume:Decreased  Handedness:Right   Mood and Affect  Mood:Anxious  Affect:Inappropriate; Constricted   Thought Process  Thought Processes:Disorganized  Descriptions of Associations:Circumstantial  Orientation:Full (Time,  Place and Person)  Thought Content:Paranoid Ideation; Scattered; Illogical  History of Schizophrenia/Schizoaffective disorder:Yes  Duration of Psychotic Symptoms:Greater than six months  Hallucinations:No data recorded Ideas of Reference:Paranoia; Delusions  Suicidal Thoughts:No data recorded Homicidal Thoughts:No data recorded  Sensorium  Memory:Immediate Poor; Recent Poor; Remote Poor  Judgment:Poor  Insight:Poor   Executive Functions  Concentration:Fair  Attention Span:Poor  Recall:Poor  Fund of Knowledge:Poor  Language:Poor   Psychomotor Activity  Psychomotor Activity:No data recorded  Assets  Assets:Communication Skills; Desire for Improvement; Resilience; Social Support   Sleep  Sleep:No data recorded   Physical Exam: Physical Exam Vitals and nursing note reviewed.  Constitutional:      Appearance: Normal appearance. He is normal weight.  Neurological:     General: No focal deficit present.     Mental Status: He is alert and oriented to person, place, and time.  Psychiatric:        Mood and Affect: Mood normal.        Behavior: Behavior normal.   Review of Systems  Constitutional: Negative.   HENT: Negative.    Eyes: Negative.   Respiratory: Negative.    Cardiovascular: Negative.   Gastrointestinal: Negative.   Genitourinary: Negative.   Musculoskeletal: Negative.   Skin: Negative.   Neurological: Negative.   Endo/Heme/Allergies: Negative.   Psychiatric/Behavioral: Negative.    Blood pressure (!) 141/91, pulse (!) 103, temperature 98.7 F (37.1 C), temperature source Oral, resp. rate 18, height 6\' 1"  (1.854 m), weight 103.4 kg, SpO2 99 %. Body mass index is 30.08 kg/m.   Treatment Plan Summary: Daily contact with patient to assess and evaluate symptoms and progress in treatment, Medication management, and Plan continue current medications.  Hipolito Martinezlopez , DO 12/17/2021, 11:12 AM

## 2021-12-17 NOTE — Progress Notes (Signed)
Patient alert and oriented x 4, affect is flat but brightens upon approach, he appears less anxious, no paranoia or restless noted, he is interacting appropriately with pees and staff and compliant with medication regimen. 15 minutes safety checks maintained will continue to monitor closely.

## 2021-12-17 NOTE — Progress Notes (Signed)
D: Patient alert and oriented. Patient denies pain. Patient denies anxiety and depression. Patient denies SI/HI/AVH. Patient requested PRN maalox for indigestion. PRN medication was provided to patient. Patient isolative to room and self during shift.   A: Scheduled medications administered to patient, per MD orders. Support and encouragement provided to patient.  Q15 minute safety checks maintained.   R: Patient compliant with medication administration and treatment plan. No adverse drug reactions noted. Patient remains safe on the unit at this time.

## 2021-12-17 NOTE — Progress Notes (Signed)
Pt calm and pleasant during assessment denies SI/HI/AVH. Pt compliant with medication administration per MD orders. Pt give education and encouragement to be active in his treatment plan. Pt observed interacting appropriately with staff and peers on the unit. Pt being monitored Q 15 minutes for safety per unit protocol. Pt remains safe on the unit.

## 2021-12-18 DIAGNOSIS — F203 Undifferentiated schizophrenia: Secondary | ICD-10-CM | POA: Diagnosis not present

## 2021-12-18 NOTE — Progress Notes (Signed)
[facility] MD Progress Note  12/18/2021 10:31 AM [name] [name]  MRN:  [id number] Subjective: Follow-up patient with schizophrenia.  Patient continues to be in bed all day.  The room is starting to smell worse.  Rarely comes out or goes to groups.  Does seem to be taking medicine.  Social work at morning report pointed out that he had up till now been refusing to let them be in contact with his mother or outpatient team.  We went into his room together and I explained to him how important this contact would be to working on safe discharge planning and at that point he signed and gave agreement for contact. Principal Problem: Schizophrenia ([place]) Diagnosis: Principal Problem:   Schizophrenia ([place]) Active Problems:   Hypertension  Total Time spent with patient: 30 minutes  Past Psychiatric History: Past history of schizophrenia  Past Medical History:  Past Medical History:  Diagnosis Date   Asthma    Depression    Psychosis ([place])    Schizoaffective disorder ([place])     Past Surgical History:  Procedure Laterality Date   BACK SURGERY     I&D groin  2017   Family History: History reviewed. No pertinent family history. Family Psychiatric  History: See previous Social History:  Social History   Substance and Sexual Activity  Alcohol Use No     Social History   Substance and Sexual Activity  Drug Use Never    Social History   Socioeconomic History   Marital status: Single    Spouse name: Not on file   Number of children: Not on file   Years of education: Not on file   Highest education level: Not on file  Occupational History   Not on file  Tobacco Use   Smoking status: Never   Smokeless tobacco: Never  Vaping Use   Vaping Use: Some days  Substance and Sexual Activity   Alcohol use: No   Drug use: Never   Sexual activity: Not Currently  Other Topics Concern   Not on file  Social History Narrative   Not on file   Social Determinants of Health   Financial Resource  Strain: Not on file  Food Insecurity: Not on file  Transportation Needs: Not on file  Physical Activity: Not on file  Stress: Not on file  Social Connections: Not on file   Additional Social History:                         Sleep: Fair  Appetite:  Fair  Current Medications: Current Facility-Administered Medications  Medication Dose Route Frequency Provider Last Rate Last Admin   acetaminophen (TYLENOL) tablet 650 mg  650 mg Oral Q6H PRN Sherlon Handing, NP       alum & mag hydroxide-simeth (MAALOX/MYLANTA) 200-200-20 MG/5ML suspension 30 mL  30 mL Oral Q4H PRN Waldon Merl F, NP   30 mL at 12/17/21 1946   cloZAPine (CLOZARIL) tablet 200 mg  200 mg Oral QHS Nocole Zammit T, MD   200 mg at 12/17/21 2105   divalproex (DEPAKOTE) DR tablet 500 mg  500 mg Oral Q12H Genevie Elman T, MD   500 mg at 12/18/21 0805   LORazepam (ATIVAN) tablet 2 mg  2 mg Oral Q6H PRN Davonn Flanery, Madie Reno, MD   2 mg at 12/17/21 2201   Or   LORazepam (ATIVAN) injection 2 mg  2 mg Intramuscular Q6H PRN Haydee Jabbour, Madie Reno, MD  magnesium hydroxide (MILK OF MAGNESIA) suspension 30 mL  30 mL Oral Daily PRN Sherlon Handing, NP        Lab Results: No results found for this or any previous visit (from the past 48 hour(s)).  Blood Alcohol level:  Lab Results  Component Value Date   ETH <10 12/03/2021   ETH <10 [id number]    Metabolic Disorder Labs: Lab Results  Component Value Date   HGBA1C 5.1 12/03/2021   MPG 99.67 12/03/2021   MPG 91.06 07/19/2020   No results found for: PROLACTIN Lab Results  Component Value Date   CHOL 197 12/06/2021   TRIG 111 12/06/2021   HDL 46 12/06/2021   CHOLHDL 4.3 12/06/2021   VLDL 22 12/06/2021   LDLCALC 129 (H) 12/06/2021   LDLCALC 177 (H) 07/19/2020    Physical Findings: AIMS:  , ,  ,  ,    CIWA:    COWS:     Musculoskeletal: Strength & Muscle Tone: within normal limits Gait & Station: normal Patient leans: N/A  Psychiatric Specialty  Exam:  Presentation  General Appearance: Casual; Well Groomed  Eye Contact:Good  Speech:Slow  Speech Volume:Decreased  Handedness:Right   Mood and Affect  Mood:Anxious  Affect:Inappropriate; Constricted   Thought Process  Thought Processes:Disorganized  Descriptions of Associations:Circumstantial  Orientation:Full (Time, Place and Person)  Thought Content:Paranoid Ideation; Scattered; Illogical  History of Schizophrenia/Schizoaffective disorder:Yes  Duration of Psychotic Symptoms:Greater than six months  Hallucinations:No data recorded Ideas of Reference:Paranoia; Delusions  Suicidal Thoughts:No data recorded Homicidal Thoughts:No data recorded  Sensorium  Memory:Immediate Poor; Recent Poor; Remote Poor  Judgment:Poor  Insight:Poor   Executive Functions  Concentration:Fair  Attention Span:Poor  Recall:Poor  Fund of Knowledge:Poor  Language:Poor   Psychomotor Activity  Psychomotor Activity:No data recorded  Assets  Assets:Communication Skills; Desire for Improvement; Resilience; Social Support   Sleep  Sleep:No data recorded   Physical Exam: Physical Exam Vitals and nursing note reviewed.  Constitutional:      Appearance: Normal appearance.  HENT:     Head: Normocephalic and atraumatic.     Mouth/Throat:     Pharynx: Oropharynx is clear.  Eyes:     Pupils: Pupils are equal, round, and reactive to light.  Cardiovascular:     Rate and Rhythm: Normal rate and regular rhythm.  Pulmonary:     Effort: Pulmonary effort is normal.     Breath sounds: Normal breath sounds.  Abdominal:     General: Abdomen is flat.     Palpations: Abdomen is soft.  Musculoskeletal:        General: Normal range of motion.  Skin:    General: Skin is warm and dry.  Neurological:     General: No focal deficit present.     Mental Status: He is alert. Mental status is at baseline.  Psychiatric:        Attention and Perception: He is inattentive.         Mood and Affect: Mood normal. Affect is blunt.        Speech: Speech is delayed.        Behavior: Behavior is slowed.        Thought Content: Thought content normal.        Cognition and Memory: Cognition is impaired.   Review of Systems  Constitutional: Negative.   HENT: Negative.    Eyes: Negative.   Respiratory: Negative.    Cardiovascular: Negative.   Gastrointestinal: Negative.   Musculoskeletal: Negative.   Skin: Negative.  Neurological: Negative.   Psychiatric/Behavioral: Negative.    Blood pressure (!) 141/91, pulse (!) 103, temperature 98.7 F (37.1 C), temperature source Oral, resp. rate 18, height 6\' 1"  (1.854 m), weight 103.4 kg, SpO2 99 %. Body mass index is 30.08 kg/m.   Treatment Plan Summary: Medication management and Plan I am going to order a clozapine blood level today I think this is probably far enough into treatment that it would be meaningful and give [place] a guideline for further changes in dosage.  As always he is encouraged to get up out of bed to clean himself up and come to groups which she has not been doing.  Social work will follow up with family and ACT team.  Alethia Berthold, MD 12/18/2021, 10:31 AM

## 2021-12-18 NOTE — [facility] Counselor (Signed)
CSW spoke with [name] at [facility].  CSW updated on patient's medications as listed on medication list.  [name] asked about an injection and CSW informed that at this time CSW was unable to see an injection, however she can follow up and let [name] know.    [name] inquired about discharge date and CSW updated that at this time discharge date is TBD.  [name] asked about patient's disposition.  CSW updated that at admission patient was reluctant to eat meals in the milieu with other patients, choosing to eat near the nurses station on a tray table.  However, patient has begun eating with others.  CSW also noted that patient is isolative to his room, though this is reminiscent of other admissions.  Penni Homans, MSW, LCSW 12/18/2021 4:15 PM

## 2021-12-18 NOTE — Plan of Care (Signed)
  Problem: Education: Goal: Knowledge of [place] [facility] Education information/materials will improve Outcome: Progressing Goal: Mental status will improve Outcome: Progressing Goal: Verbalization of understanding the information provided will improve Outcome: Progressing   Problem: Health Behavior/Discharge Planning: Goal: Compliance with treatment plan for underlying cause of condition will improve Outcome: Progressing   Problem: Safety: Goal: Periods of time without injury will increase Outcome: Progressing   Problem: Self-Concept: Goal: Level of anxiety will decrease Outcome: Progressing   Problem: Health Behavior/Discharge Planning: Goal: Compliance with prescribed medication regimen will improve Outcome: Progressing

## 2021-12-18 NOTE — Progress Notes (Signed)
D: Patient alert and oriented. Patient denies pain. Patient denies anxiety and depression. Patient denies SI/HI/AVH. Patient remains isolative to room during shift with the exception to coming out for meals and medication.  A: Scheduled medications administered to patient, per MD orders.  Support and encouragement provided to patient.  Q15 minute safety checks maintained.   R: Patient compliant with medication administration and treatment plan. No adverse drug reactions noted. Patient remains safe on the unit at this time.

## 2021-12-18 NOTE — [facility] Suicide Risk Assessment (Signed)
[facility] INPATIENT:  Family/Significant Other Suicide Prevention Education  Suicide Prevention Education:  Contact Attempts: [name] [name], mother, [phone] has been identified by the patient as the family member/significant other with whom the patient will be residing, and identified as the person(s) who will aid the patient in the event of a mental health crisis.  With written consent from the patient, two attempts were made to provide suicide prevention education, prior to and/or following the patient's discharge.  We were unsuccessful in providing suicide prevention education.  A suicide education pamphlet was given to the patient to share with family/significant other.  Date and time of first attempt:12/18/2021 at 4:19 PM Date and time of second attempt: Second attempt is needed.   CSW contacted the above.  Phone was answered by someone, however, they did not identify themselves as [name] [name], stating "she is not available".  CSW did not provide amy identifying information.  CSW left HIPAA compliant message requesting return call.   Harden Mo 12/18/2021, 4:17 PM

## 2021-12-18 NOTE — Group Note (Signed)
[facility] LCSW Group Therapy Note   Group Date: 12/18/2021 Start Time: 1300 End Time: 1400  Type of Therapy/Topic:  Group Therapy:  Feelings about Diagnosis  Participation Level:  Did Not Attend    Description of Group:    This group will allow patients to explore their thoughts and feelings about diagnoses they have received. Patients will be guided to explore their level of understanding and acceptance of these diagnoses. Facilitator will encourage patients to process their thoughts and feelings about the reactions of others to their diagnosis, and will guide patients in identifying ways to discuss their diagnosis with significant others in their lives. This group will be process-oriented, with patients participating in exploration of their own experiences as well as giving and receiving support and challenge from other group members.   Therapeutic Goals: 1. Patient will demonstrate understanding of diagnosis as evidence by identifying two or more symptoms of the disorder:  2. Patient will be able to express two feelings regarding the diagnosis 3. Patient will demonstrate ability to communicate their needs through discussion and/or role plays  Summary of Patient Progress: X   Therapeutic Modalities:   Cognitive Behavioral Therapy Brief Therapy Feelings Identification    Shantice Menger R Octavious Zidek, LCSW 

## 2021-12-18 NOTE — Progress Notes (Signed)
Recreation Therapy Notes    Date: 12/18/2021   Time: 10:45am     Location: Craft room   Behavioral response: N/A   Intervention Topic: Values   Discussion/Intervention: Patient refused to attend group.    Clinical Observations/Feedback:  Patient refused to attend group.    Islah Eve LRT/CTRS        Idy Rawling 12/18/2021 11:06 AM

## 2021-12-19 ENCOUNTER — Other Ambulatory Visit: Payer: Self-pay

## 2021-12-19 DIAGNOSIS — F203 Undifferentiated schizophrenia: Secondary | ICD-10-CM | POA: Diagnosis not present

## 2021-12-19 LAB — CLOZAPINE (CLOZARIL)
Clozapine Lvl: 217 ng/mL — ABNORMAL LOW (ref 350–600)
NorClozapine: 102 ng/mL
Total(Cloz+Norcloz): 319 ng/mL

## 2021-12-19 MED ORDER — AMLODIPINE BESYLATE 10 MG PO TABS: 10.0000 mg | ORAL_TABLET | Freq: Every day | ORAL | 1 refills | Status: DC

## 2021-12-19 MED ORDER — AMLODIPINE BESYLATE 10 MG PO TABS
10.0000 mg | ORAL_TABLET | Freq: Every day | ORAL | 0 refills | Status: DC
  Filled 2021-12-19: qty 7, 7d supply, fill #0

## 2021-12-19 MED ORDER — DIVALPROEX SODIUM 500 MG PO DR TAB
500.0000 mg | DELAYED_RELEASE_TABLET | Freq: Two times a day (BID) | ORAL | 0 refills | Status: DC
Start: 2021-12-19 — End: 2022-04-22
  Filled 2021-12-19: qty 14, 7d supply, fill #0

## 2021-12-19 MED ORDER — PALIPERIDONE PALMITATE ER 234 MG/1.5ML IM SUSY: 234.0000 mg | PREFILLED_SYRINGE | INTRAMUSCULAR | 1 refills | Status: DC

## 2021-12-19 MED ORDER — CLOZAPINE 200 MG PO TABS: 200.0000 mg | ORAL_TABLET | Freq: Every day | ORAL | 1 refills | Status: DC

## 2021-12-19 MED ORDER — CLOZAPINE 200 MG PO TABS
200.0000 mg | ORAL_TABLET | Freq: Every day | ORAL | 0 refills | Status: DC
  Filled 2021-12-19: qty 7, 7d supply, fill #0

## 2021-12-19 MED ORDER — DIVALPROEX SODIUM 500 MG PO DR TAB: 500.0000 mg | DELAYED_RELEASE_TABLET | Freq: Two times a day (BID) | ORAL | 1 refills | Status: DC

## 2021-12-19 NOTE — Progress Notes (Signed)
  [facility] Adult Case Management Discharge Plan :  Will you be returning to the same living situation after discharge:  Yes,  pt reports that she is returning home. At discharge, do you have transportation home?: Yes,  CSW will assist patient in transportation needs. Do you have the ability to pay for your medications: Yes,  [employer] Medicaid  Release of information consent forms completed and in the chart;  Patient's signature needed at discharge.  Patient to Follow up at:  Follow-up Information     [address] [facility] [place] [employer]. Follow up.   Why: Your ACTT team will follow up with you at 11AM on Monday 12/24/2021. Contact information: [address] [place] [address] [phone]                 Next level of care provider has access to [facility] Link:no  Safety Planning and Suicide Prevention discussed: No.  SPE completed with the patient.  SPE attempts made with mother.      Has patient been referred to the Quitline?: Patient refused referral  Patient reports that he is not a smoker.   Patient has been referred for addiction treatment: Pt. refused referral  Harden Mo, LCSW 12/19/2021, 9:29 AM

## 2021-12-19 NOTE — Group Note (Signed)
[facility] LCSW Group Therapy Note   Group Date: 12/19/2021 Start Time: 1300 End Time: 1400   Type of Therapy/Topic:  Group Therapy:  Emotion Regulation  Participation Level:  Did Not Attend   Mood:  Description of Group:    The purpose of this group is to assist patients in learning to regulate negative emotions and experience positive emotions. Patients will be guided to discuss ways in which they have been vulnerable to their negative emotions. These vulnerabilities will be juxtaposed with experiences of positive emotions or situations, and patients challenged to use positive emotions to combat negative ones. Special emphasis will be placed on coping with negative emotions in conflict situations, and patients will process healthy conflict resolution skills.  Therapeutic Goals: Patient will identify two positive emotions or experiences to reflect on in order to balance out negative emotions:  Patient will label two or more emotions that they find the most difficult to experience:  Patient will be able to demonstrate positive conflict resolution skills through discussion or role plays:   Summary of Patient Progress: Group not held due to unit acuity.    Therapeutic Modalities:   Cognitive Behavioral Therapy Feelings Identification Dialectical Behavioral Therapy   Moishy Laday J Blessing Ozga, LCSW 

## 2021-12-19 NOTE — Progress Notes (Signed)
D- Patient alert and oriented. Patient presents in a pleasant mood on assessment, reporting that he slept good last night and had no complaints to voice to this Clinical research associate. Patient denies SI, HI, AVH, and pain at this time. Patient also denies any signs/symptoms of depression/anxiety. Patient had no stated goals for today.  A- Scheduled medications administered to patient, per MD orders. Support and encouragement provided. Routine safety checks conducted every 15 minutes. Patient informed to notify staff with problems or concerns.  R- No adverse drug reactions noted. Patient contracts for safety at this time. Patient compliant with medications. Patient receptive, calm, and cooperative. Patient interacts well with others on the unit. Patient remains safe at this time.

## 2021-12-19 NOTE — Progress Notes (Signed)
Patient approached nursing station requesting mediation to help with heartburn.  PRN medication given  to help with symptoms.     C Butler-Nicholson, LPN

## 2021-12-19 NOTE — BHH Counselor (Signed)
CSW faxed copy of discharge summary to [facility] [phone]). Receipt that it was sent received via fax machine.   [name] [name]. Algis Greenhouse, MSW, LCSW, LCAS 12/19/2021 4:26 PM

## 2021-12-19 NOTE — Progress Notes (Signed)
Patient denies SI,HI and AVH. Verbalized understanding discharge instructions,prescriptions and follow up care. 7 days medicines given to patient. All belongings returned from [employer]. Patient escorted out with staff and transported by cab.

## 2021-12-19 NOTE — Progress Notes (Signed)
Patient is awake. He has been pacing the halls for the past 10 minutes. He denies having any active symptoms. Declined any PRN medication to help aid in sleep.      C Butler-Nicholson, LPN

## 2021-12-19 NOTE — Progress Notes (Signed)
Recreation Therapy Notes   Date: 12/19/2021   Time: 10:35 A.M.   Location: Craft room    Behavioral response: N/A   Intervention Topic: Decision Making    Discussion/Intervention: Patient refused to attend group.    Clinical Observations/Feedback:  Patient refused to attend group.    Sixto Bowdish LRT/CTRS         Abby Tucholski 12/19/2021 11:12 AM

## 2021-12-19 NOTE — BHH Suicide Risk Assessment (Signed)
[facility] Discharge Suicide Risk Assessment   Principal Problem: Schizophrenia [facility]) Discharge Diagnoses: Principal Problem:   Schizophrenia (HCC) Active Problems:   Hypertension   Total Time spent with patient: 30 minutes  Musculoskeletal: Strength & Muscle Tone: within normal limits Gait & Station: normal Patient leans: N/A  Psychiatric Specialty Exam  Presentation  General Appearance: Casual; Well Groomed  Eye Contact:Good  Speech:Slow  Speech Volume:Decreased  Handedness:Right   Mood and Affect  Mood:Anxious  Duration of Depression Symptoms: No data recorded Affect:Inappropriate; Constricted   Thought Process  Thought Processes:Disorganized  Descriptions of Associations:Circumstantial  Orientation:Full (Time, Place and Person)  Thought Content:Paranoid Ideation; Scattered; Illogical  History of Schizophrenia/Schizoaffective disorder:Yes  Duration of Psychotic Symptoms:Greater than six months  Hallucinations:No data recorded Ideas of Reference:Paranoia; Delusions  Suicidal Thoughts:No data recorded Homicidal Thoughts:No data recorded  Sensorium  Memory:Immediate Poor; Recent Poor; Remote Poor  Judgment:Poor  Insight:Poor   Executive Functions  Concentration:Fair  Attention Span:Poor  Recall:Poor  Fund of Knowledge:Poor  Language:Poor   Psychomotor Activity  Psychomotor Activity:No data recorded  Assets  Assets:Communication Skills; Desire for Improvement; Resilience; Social Support   Sleep  Sleep:No data recorded  Physical Exam: Physical Exam Vitals and nursing note reviewed.  Constitutional:      Appearance: Normal appearance.  HENT:     Head: Normocephalic and atraumatic.     Mouth/Throat:     Pharynx: Oropharynx is clear.  Eyes:     Pupils: Pupils are equal, round, and reactive to light.  Cardiovascular:     Rate and Rhythm: Normal rate and regular rhythm.  Pulmonary:     Effort: Pulmonary effort is normal.      Breath sounds: Normal breath sounds.  Abdominal:     General: Abdomen is flat.     Palpations: Abdomen is soft.  Musculoskeletal:        General: Normal range of motion.  Skin:    General: Skin is warm and dry.  Neurological:     General: No focal deficit present.     Mental Status: He is alert. Mental status is at baseline.  Psychiatric:        Attention and Perception: Attention normal.        Mood and Affect: Mood normal. Affect is blunt.        Speech: Speech normal.        Behavior: Behavior is cooperative.        Thought Content: Thought content normal.        Cognition and Memory: Cognition normal.        Judgment: Judgment normal.   Review of Systems  Constitutional: Negative.   HENT: Negative.    Eyes: Negative.   Respiratory: Negative.    Cardiovascular: Negative.   Gastrointestinal: Negative.   Musculoskeletal: Negative.   Skin: Negative.   Neurological: Negative.   Psychiatric/Behavioral: Negative.    Blood pressure (!) 141/91, pulse (!) 103, temperature 98.7 F (37.1 C), temperature source Oral, resp. rate 18, height 6\' 1"  (1.854 m), weight 103.4 kg, SpO2 99 %. Body mass index is 30.08 kg/m.  Mental Status Per Nursing Assessment::   On Admission:  NA  Demographic Factors:  Male  Loss Factors: Financial problems/change in socioeconomic status  Historical Factors: Impulsivity  Risk Reduction Factors:   Living with another person, especially a relative, Positive social support, and Positive therapeutic relationship  Continued Clinical Symptoms:  Schizophrenia:   Less than 25 years old  Cognitive Features That Contribute To Risk:  Thought constriction (tunnel  vision)    Suicide Risk:  Minimal: No identifiable suicidal ideation.  Patients presenting with no risk factors but with morbid ruminations; may be classified as minimal risk based on the severity of the depressive symptoms   Follow-up [place]. Follow up.   Why: Your ACTT team will follow up with you at 11AM on Monday 12/24/2021. Contact information: [place] [address] [phone]                 Plan Of Care/Follow-up recommendations:  Patient denies suicidal ideation.  Affect euthymic although a little blunted.  No behavior problems in the hospital.  Agrees to plan for outpatient treatment.  Alethia Berthold, MD 12/19/2021, 9:40 AM

## 2021-12-19 NOTE — Plan of Care (Signed)
  Problem: Health Behavior/Discharge Planning: Goal: Compliance with prescribed medication regimen will improve Outcome: Progressing   Problem: Education: Goal: Will be free of psychotic symptoms Outcome: Progressing   Problem: Self-Concept: Goal: Ability to identify factors that promote anxiety will improve Outcome: Progressing Goal: Level of anxiety will decrease Outcome: Progressing   Problem: Safety: Goal: Periods of time without injury will increase Outcome: Progressing   Problem: Health Behavior/Discharge Planning: Goal: Compliance with treatment plan for underlying cause of condition will improve Outcome: Progressing   Problem: Education: Goal: Knowledge of [facility] Education information/materials will improve Outcome: Progressing Goal: Emotional status will improve Outcome: Progressing Goal: Mental status will improve Outcome: Progressing Goal: Verbalization of understanding the information provided will improve Outcome: Progressing

## 2021-12-19 NOTE — Progress Notes (Signed)
Recreation Therapy Notes  INPATIENT RECREATION TR PLAN  Patient Details Name: [name] [name] MRN: [id number] DOB: 1995/12/01 Today's Date: 12/19/2021  Rec Therapy Plan Is patient appropriate for Therapeutic Recreation?: Yes Treatment times per week: at least 3 Estimated Length of Stay: 5-7 days TR Treatment/Interventions: Group participation (Comment)  Discharge Criteria Pt will be discharged from therapy if:: Discharged Treatment plan/goals/alternatives discussed and agreed upon by:: Patient/family  Discharge Summary Short term goals set: Patient will engage in groups without prompting or encouragement from LRT x3 group sessions within 5 recreation therapy group sessions Short term goals met: Not met Reason goals not met: Patient did not attend any groups Therapeutic equipment acquired: N/A Reason patient discharged from therapy: Discharge from hospital Pt/family agrees with progress & goals achieved: Yes Date patient discharged from therapy: 12/19/21   Marlyn Rabine 12/19/2021, 1:59 PM

## 2021-12-19 NOTE — Progress Notes (Signed)
Patient at nurses station requesting medication to help with anxiety PRN medication given and received with no issues.    C Butler-Nicholson, LPN

## 2021-12-19 NOTE — Discharge Summary (Signed)
Physician Discharge Summary Note  Patient:  [name] [name] is an 26 y.o., male MRN:  [id number] DOB:  1996/01/21 Patient phone:  [phone] (home)  Patient address:   [address] [name] [id number],  Total Time spent with patient: 30 minutes  Date of Admission:  12/04/2021 Date of Discharge: 12/19/2021  Reason for Admission: Admitted through the emergency room where he presented under IVC with worsening psychosis confused behavior poor self-care  Principal Problem: Schizophrenia [facility](HCC) Discharge Diagnoses: Principal Problem:   Schizophrenia (HCC) Active Problems:   Hypertension   Past Psychiatric History: Past history of psychosis schizophrenia or recurrent hospitalizations.  History of noncompliance.  Past Medical History:  Past Medical History:  Diagnosis Date   Asthma    Depression    Psychosis (HCC)    Schizoaffective disorder (HCC)     Past Surgical History:  Procedure Laterality Date   BACK SURGERY     I&D groin  2017   Family History: History reviewed. No pertinent family history. Family Psychiatric  History: See previous.  None reported Social History:  Social History   Substance and Sexual Activity  Alcohol Use No     Social History   Substance and Sexual Activity  Drug Use Never    Social History   Socioeconomic History   Marital status: Single    Spouse name: Not on file   Number of children: Not on file   Years of education: Not on file   Highest education level: Not on file  Occupational History   Not on file  Tobacco Use   Smoking status: Never   Smokeless tobacco: Never  Vaping Use   Vaping Use: Some days  Substance and Sexual Activity   Alcohol use: No   Drug use: Never   Sexual activity: Not Currently  Other Topics Concern   Not on file  Social History Narrative   Not on file   Social Determinants of Health   Financial Resource Strain: Not on file  Food Insecurity: Not on file  Transportation Needs: Not  on file  Physical Activity: Not on file  Stress: Not on file  Social Connections: Not on file    Hospital Course: Admitted to psychiatric unit.  He was given his Invega shot while still in the emergency room.  In the hospital was started back on clozapine which had been uniquely effective for him in the past.  Gradually titrated dose up to his current dose of 200 mg at night.  Patient stayed mostly withdrawn and would often endorse still having hallucinations but has denied suicidal ideation and on the day of discharge that he is feeling better and the voices are not bothering him.  Agrees to continued outpatient treatment and agrees to follow-up with his ACT team.  Physical Findings: AIMS:  , ,  ,  ,    CIWA:    COWS:     Musculoskeletal: Strength & Muscle Tone: within normal limits Gait & Station: normal Patient leans: N/A   Psychiatric Specialty Exam:  Presentation  General Appearance: Casual; Well Groomed  Eye Contact:Good  Speech:Slow  Speech Volume:Decreased  Handedness:Right   Mood and Affect  Mood:Anxious  Affect:Inappropriate; Constricted   Thought Process  Thought Processes:Disorganized  Descriptions of Associations:Circumstantial  Orientation:Full (Time, Place and Person)  Thought Content:Paranoid Ideation; Scattered; Illogical  History of Schizophrenia/Schizoaffective disorder:Yes  Duration of Psychotic Symptoms:Greater than six months  Hallucinations:No data recorded Ideas of Reference:Paranoia; Delusions  Suicidal Thoughts:No data recorded Homicidal Thoughts:No  data recorded  Sensorium  Memory:Immediate Poor; Recent Poor; Remote Poor  Judgment:Poor  Insight:Poor   Executive Functions  Concentration:Fair  Attention Span:Poor  Recall:Poor  Fund of Knowledge:Poor  Language:Poor   Psychomotor Activity  Psychomotor Activity:No data recorded  Assets  Assets:Communication Skills; Desire for Improvement; Resilience; Social  Support   Sleep  Sleep:No data recorded   Physical Exam: Physical Exam Vitals and nursing note reviewed.  Constitutional:      Appearance: Normal appearance.  HENT:     Head: Normocephalic and atraumatic.     Mouth/Throat:     Pharynx: Oropharynx is clear.  Eyes:     Pupils: Pupils are equal, round, and reactive to light.  Cardiovascular:     Rate and Rhythm: Normal rate and regular rhythm.  Pulmonary:     Effort: Pulmonary effort is normal.     Breath sounds: Normal breath sounds.  Abdominal:     General: Abdomen is flat.     Palpations: Abdomen is soft.  Musculoskeletal:        General: Normal range of motion.  Skin:    General: Skin is warm and dry.  Neurological:     General: No focal deficit present.     Mental Status: He is alert. Mental status is at baseline.  Psychiatric:        Attention and Perception: Attention normal.        Mood and Affect: Mood normal. Affect is blunt.        Speech: Speech normal.        Behavior: Behavior is withdrawn.        Thought Content: Thought content normal.        Cognition and Memory: Cognition normal.        Judgment: Judgment normal.   Review of Systems  Constitutional: Negative.   HENT: Negative.    Eyes: Negative.   Respiratory: Negative.    Cardiovascular: Negative.   Gastrointestinal: Negative.   Musculoskeletal: Negative.   Skin: Negative.   Neurological: Negative.   Psychiatric/Behavioral: Negative.    Blood pressure (!) 141/91, pulse (!) 103, temperature 98.7 F (37.1 C), temperature source Oral, resp. rate 18, height 6\' 1"  (1.854 m), weight 103.4 kg, SpO2 99 %. Body mass index is 30.08 kg/m.   Social History   Tobacco Use  Smoking Status Never  Smokeless Tobacco Never   Tobacco Cessation:  N/A, patient does not currently use tobacco products   Blood Alcohol level:  Lab Results  Component Value Date   ETH <10 12/03/2021   ETH <10 07/18/2020    Metabolic Disorder Labs:  Lab Results   Component Value Date   HGBA1C 5.1 12/03/2021   MPG 99.67 12/03/2021   MPG 91.06 07/19/2020   No results found for: PROLACTIN Lab Results  Component Value Date   CHOL 197 12/06/2021   TRIG 111 12/06/2021   HDL 46 12/06/2021   CHOLHDL 4.3 12/06/2021   VLDL 22 12/06/2021   LDLCALC 129 (H) 12/06/2021   LDLCALC 177 (H) 07/19/2020    See Psychiatric Specialty Exam and Suicide Risk Assessment completed by Attending Physician prior to discharge.  Discharge destination:  Home  Is patient on multiple antipsychotic therapies at discharge:  Yes,   Do you recommend tapering to monotherapy for antipsychotics?  No   Has Patient had three or more failed trials of antipsychotic monotherapy by history:  Yes,   Antipsychotic medications that previously failed include:   1.  Risperdal., 2.  Haldol., and 3.  Olanzapine.  Recommended Plan for Multiple Antipsychotic Therapies: Second antipsychotic is Clozapine.  Reason for adding Clozapine uniquely responsive to clozapine but also requires a long-acting injectable to ensure some continuity of care and to get a better effect on psychosis  Discharge Instructions     Diet - low sodium heart healthy   Complete by: As directed    Increase activity slowly   Complete by: As directed       Allergies as of 12/19/2021   No Known Allergies      Medication List     STOP taking these medications    glycopyrrolate 2 MG tablet Commonly known as: ROBINUL   temazepam 15 MG capsule Commonly known as: RESTORIL       TAKE these medications      Indication  amLODipine 10 MG tablet Commonly known as: NORVASC Take 1 tablet (10 mg total) by mouth daily.  Indication: High Blood Pressure Disorder   clozapine 200 MG tablet Commonly known as: CLOZARIL Take 1 tablet (200 mg total) by mouth at bedtime. What changed:  medication strength how much to take when to take this Another medication with the same name was removed. Continue taking this  medication, and follow the directions you see here.  Indication: Schizophrenia   divalproex 500 MG DR tablet Commonly known as: DEPAKOTE Take 1 tablet (500 mg total) by mouth every 12 (twelve) hours.  Indication: Schizophrenia   paliperidone 234 MG/1.5ML injection Commonly known as: INVEGA SUSTENNA Inject 234 mg into the muscle every 28 (twenty-eight) days. Start taking on: January 01, 2022 What changed: Another medication with the same name was removed. Continue taking this medication, and follow the directions you see here.  Indication: Schizoaffective Disorder, Next dose due on 08/21/2020        Follow-up Information     [facility] [place] [employer]. Follow up.   Why: Your ACTT team will follow up with you at 11AM on Monday 12/24/2021. Contact information: [address] [place] [address] [phone]                 Follow-up recommendations: Follow up with his ACT team.  Continue current medicine.  Comments: 7-day supply will be requested and prescriptions provided.  Signed: Mordecai Rasmussen, MD 12/19/2021, 9:47 AM

## 2021-12-19 NOTE — Plan of Care (Signed)
  Problem: Group Participation Goal: STG - Patient will engage in groups without prompting or encouragement from LRT x3 group sessions within 5 recreation therapy group sessions Description: STG - Patient will engage in groups without prompting or encouragement from LRT x3 group sessions within 5 recreation therapy group sessions 12/19/2021 [id number] by Ernest Haber, LRT Outcome: Not Applicable [id number] by Ernest Haber, LRT Outcome: Not Met (add Reason)

## 2022-03-30 ENCOUNTER — Emergency Department
Admission: EM | Admit: 2022-03-30 | Discharge: 2022-04-01 | Disposition: A | Discharge status: Home / Self Care | Payer: No Typology Code available for payment source | Attending: Emergency Medicine | Admitting: Emergency Medicine

## 2022-03-30 DIAGNOSIS — J45909 Unspecified asthma, uncomplicated: Secondary | ICD-10-CM | POA: Diagnosis not present

## 2022-03-30 DIAGNOSIS — F250 Schizoaffective disorder, bipolar type: Secondary | ICD-10-CM | POA: Diagnosis not present

## 2022-03-30 DIAGNOSIS — F29 Unspecified psychosis not due to a substance or known physiological condition: Secondary | ICD-10-CM | POA: Insufficient documentation

## 2022-03-30 DIAGNOSIS — R259 Unspecified abnormal involuntary movements: Secondary | ICD-10-CM | POA: Insufficient documentation

## 2022-03-30 DIAGNOSIS — Z1330 Encounter for screening examination for mental health and behavioral disorders, unspecified: Secondary | ICD-10-CM | POA: Insufficient documentation

## 2022-03-30 DIAGNOSIS — Z046 Encounter for general psychiatric examination, requested by authority: Secondary | ICD-10-CM | POA: Diagnosis not present

## 2022-03-30 DIAGNOSIS — Z79899 Other long term (current) drug therapy: Secondary | ICD-10-CM | POA: Diagnosis not present

## 2022-03-30 LAB — COMPREHENSIVE METABOLIC PANEL
ALT: 70 U/L — ABNORMAL HIGH (ref 0–44)
AST: 50 U/L — ABNORMAL HIGH (ref 15–41)
Albumin: 4.3 g/dL (ref 3.5–5.0)
Alkaline Phosphatase: 87 U/L (ref 38–126)
Anion gap: 9 (ref 5–15)
BUN: 17 mg/dL (ref 6–20)
CO2: 23 mmol/L (ref 22–32)
Calcium: 9.4 mg/dL (ref 8.9–10.3)
Chloride: 107 mmol/L (ref 98–111)
Creatinine, Ser: 1.03 mg/dL (ref 0.61–1.24)
GFR, Estimated: >60 mL/min (ref >60)
Glucose, Bld: 96 mg/dL (ref 70–99)
Potassium: 3.6 mmol/L (ref 3.5–5.1)
Sodium: 139 mmol/L (ref 135–145)
Total Bilirubin: 0.4 mg/dL (ref 0.3–1.2)
Total Protein: 7.4 g/dL (ref 6.5–8.1)

## 2022-03-30 LAB — CBC WITH DIFFERENTIAL/PLATELET
Abs Immature Granulocytes: 0.03 10*3/uL (ref 0.00–0.07)
Basophils Absolute: 0 10*3/uL (ref 0.0–0.1)
Basophils Relative: 0 %
Eosinophils Absolute: 0 10*3/uL (ref 0.0–0.5)
Eosinophils Relative: 0 %
HCT: 42.3 % (ref 39.0–52.0)
Hemoglobin: 14.1 g/dL (ref 13.0–17.0)
Immature Granulocytes: 0 %
Lymphocytes Relative: 33 %
Lymphs Abs: 2.9 10*3/uL (ref 0.7–4.0)
MCH: 27.9 pg (ref 26.0–34.0)
MCHC: 33.3 g/dL (ref 30.0–36.0)
MCV: 83.8 fL (ref 80.0–100.0)
Monocytes Absolute: 0.8 10*3/uL (ref 0.1–1.0)
Monocytes Relative: 9 %
Neutro Abs: 5 10*3/uL (ref 1.7–7.7)
Neutrophils Relative %: 58 %
Platelets: 343 10*3/uL (ref 150–400)
RBC: 5.05 MIL/uL (ref 4.22–5.81)
RDW: 13.2 % (ref 11.5–15.5)
WBC: 8.7 10*3/uL (ref 4.0–10.5)
nRBC: 0 % (ref 0.0–0.2)

## 2022-03-30 LAB — VALPROIC ACID LEVEL: Valproic Acid Lvl: <10 ug/mL — ABNORMAL LOW (ref 50.0–100.0)

## 2022-03-30 LAB — CBC
HCT: 42.7 % (ref 39.0–52.0)
Hemoglobin: 14 g/dL (ref 13.0–17.0)
MCH: 27.4 pg (ref 26.0–34.0)
MCHC: 32.8 g/dL (ref 30.0–36.0)
MCV: 83.6 fL (ref 80.0–100.0)
Platelets: 353 10*3/uL (ref 150–400)
RBC: 5.11 MIL/uL (ref 4.22–5.81)
RDW: 13 % (ref 11.5–15.5)
WBC: 8.7 10*3/uL (ref 4.0–10.5)
nRBC: 0 % (ref 0.0–0.2)

## 2022-03-30 LAB — ETHANOL: Alcohol, Ethyl (B): <10 mg/dL (ref <10)

## 2022-03-30 LAB — ACETAMINOPHEN LEVEL: Acetaminophen (Tylenol), Serum: <10 ug/mL — ABNORMAL LOW (ref 10–30)

## 2022-03-30 LAB — SALICYLATE LEVEL: Salicylate Lvl: <7 mg/dL — ABNORMAL LOW (ref 7.0–30.0)

## 2022-03-30 MED ORDER — PALIPERIDONE ER 3 MG PO TB24
3.0000 mg | ORAL_TABLET | Freq: Every day | ORAL | Status: DC
  Administered 2022-03-30 – 2022-03-31 (×2): 3 mg via ORAL
  Filled 2022-03-30 (×3): qty 1

## 2022-03-30 MED ORDER — DIVALPROEX SODIUM 500 MG PO DR TAB
500.0000 mg | DELAYED_RELEASE_TABLET | Freq: Two times a day (BID) | ORAL | Status: DC
  Administered 2022-03-30 – 2022-04-01 (×5): 500 mg via ORAL
  Filled 2022-03-30 (×5): qty 1

## 2022-03-30 MED ORDER — CLOZAPINE 25 MG PO TABS
50.0000 mg | ORAL_TABLET | Freq: Every day | ORAL | Status: DC
  Administered 2022-03-30 – 2022-03-31 (×2): 50 mg via ORAL
  Filled 2022-03-30 (×2): qty 2

## 2022-03-30 MED ORDER — AMLODIPINE BESYLATE 5 MG PO TABS
10.0000 mg | ORAL_TABLET | Freq: Every day | ORAL | Status: DC
  Administered 2022-03-30 – 2022-04-01 (×3): 10 mg via ORAL
  Filled 2022-03-30 (×3): qty 2

## 2022-03-30 NOTE — ED Notes (Addendum)
Pt made aware need for urine specimen. Pt verbalized understanding.

## 2022-03-30 NOTE — ED Notes (Signed)
Pt dressed out into burgundy scrubs with this RN and tech, [employer]. Belongings placed in belongings bag: White vape Black crocs [employer] sweater Black underwear Wallace Cullens hat

## 2022-03-30 NOTE — ED Notes (Signed)
Ivc / consult pending

## 2022-03-30 NOTE — Consult Note (Signed)
Pharmacy - Clozapine     This patient's order has been reviewed for prescribing contraindications.   Clozapine REMS enrollment Verified: 03/30/2022, [id number] Current Outpatient Monitoring: weekly (patient noncompliant PTA)  Home Regimen: clozapine 200 mg QHS  Dose Adjustments This Admission: re-titration stating at 50 mg daily  Labs: weekly 9/9 ANC: 5000/uL (reported in REMS website)  Plan: Continue to monitor weekly ANC labs while inpatient  **The medication is being dispensed pursuant to the FDA REMS suspension order of 06/09/20 that allows for dispensing without a patient REMS dispense authorization (RDA).   Celene Squibb, PharmD PGY1 Pharmacy Resident 03/30/2022 10:41 AM

## 2022-03-30 NOTE — ED Provider Notes (Signed)
[facility] Provider Note    Event Date/Time   First MD Initiated Contact with Patient 03/30/22 [phone]     (approximate)   History   Psychiatric Evaluation   HPI  [name] [name] is a 26 y.o. male history of schizoaffective disorder, depression, asthma who presents to the emergency department with law enforcement under IVC.   Per IVC paperwork taken out by law enforcement "respondent is intoxicated in the [employer] PVA.  Respondent told law enforcement that he had walked there from multiple miles away.  Respondent told law enforcement that he did not know the time, the year, his whereabouts or the current president."  Patient denies SI, HI or hallucinations.  States he has not been drinking or doing drugs today.  He is not sure why he is here.  States he is not on any psychiatric medications.  History provided by patient and law enforcement.    Past Medical History:  Diagnosis Date   Asthma    Depression    Psychosis (HCC)    Schizoaffective disorder (HCC)     Past Surgical History:  Procedure Laterality Date   BACK SURGERY     I&D groin  2017    MEDICATIONS:  Prior to Admission medications   Medication Sig Start Date End Date Taking? Authorizing Provider  amLODipine (NORVASC) 10 MG tablet Take 1 tablet (10 mg total) by mouth daily. 12/19/21   [name], [name], MD  clozapine (CLOZARIL) 200 MG tablet Take 1 tablet (200 mg total) by mouth at bedtime. 12/19/21   [name], [name], MD  divalproex (DEPAKOTE) 500 MG DR tablet Take 1 tablet (500 mg total) by mouth every 12 (twelve) hours. 12/19/21   [name], [name], MD  paliperidone (INVEGA SUSTENNA) 234 MG/1.5ML injection Inject 234 mg into the muscle every 28 (twenty-eight) days. 01/01/22   [name], [name], MD    Physical Exam   Triage Vital Signs: ED Triage Vitals  Enc Vitals Group     BP 03/30/22 0515 117/80     Pulse Rate 03/30/22 0515 90     Resp 03/30/22 0515 20     Temp 03/30/22 0515 98.5 F  (36.9 C)     Temp Source 03/30/22 0515 Oral     SpO2 03/30/22 0515 96 %     Weight --      Height --      Head Circumference --      Peak Flow --      Pain Score 03/30/22 0516 0     Pain Loc --      Pain Edu? --      Excl. in GC? --     Most recent vital signs: Vitals:   03/30/22 0515  BP: 117/80  Pulse: 90  Resp: 20  Temp: 98.5 F (36.9 C)  SpO2: 96%    CONSTITUTIONAL: Alert and oriented and responds appropriately to questions. Well-appearing; well-nourished HEAD: Normocephalic, atraumatic EYES: Conjunctivae clear, pupils appear equal, sclera nonicteric ENT: normal nose; moist mucous membranes NECK: Supple, normal ROM CARD: RRR; S1 and S2 appreciated; no murmurs, no clicks, no rubs, no gallops RESP: Normal chest excursion without splinting or tachypnea; breath sounds clear and equal bilaterally; no wheezes, no rhonchi, no rales, no hypoxia or respiratory distress, speaking full sentences ABD/GI: Normal bowel sounds; non-distended; soft, non-tender, no rebound, no guarding, no peritoneal signs BACK: The back appears normal EXT: Normal ROM in all joints; no deformity noted, no edema; no cyanosis SKIN: Normal  color for age and race; warm; no rash on exposed skin NEURO: Moves all extremities equally, normal speech PSYCH: Flat and bizarre affect.  Denies SI, HI.  Not responding to internal stimuli.   ED Results / Procedures / Treatments   LABS: (all labs ordered are listed, but only abnormal results are displayed) Labs Reviewed  COMPREHENSIVE METABOLIC PANEL - Abnormal; Notable for the following components:      Result Value   AST 50 (*)    ALT 70 (*)    All other components within normal limits  SALICYLATE LEVEL - Abnormal; Notable for the following components:   Salicylate Lvl <7.0 (*)    All other components within normal limits  ACETAMINOPHEN LEVEL - Abnormal; Notable for the following components:   Acetaminophen (Tylenol), Serum <10 (*)    All other components  within normal limits  VALPROIC ACID LEVEL - Abnormal; Notable for the following components:   Valproic Acid Lvl <10 (*)    All other components within normal limits  ETHANOL  CBC  URINE DRUG SCREEN, QUALITATIVE ([facility] ONLY)  URINALYSIS, ROUTINE W REFLEX MICROSCOPIC     EKG:   RADIOLOGY: My personal review and interpretation of imaging:    I have personally reviewed all radiology reports.   No results found.   PROCEDURES:  Critical Care performed: No     Procedures    IMPRESSION / MDM / ASSESSMENT AND PLAN / ED COURSE  I reviewed the triage vital signs and the nursing notes.    Patient with schizoaffective disorder not on medications here with police under IVC for abnormal behavior, altered mental status.    DIFFERENTIAL DIAGNOSIS (includes but not limited to):   Decompensated schizoaffective disorder, intoxication, depression, anxiety, doubt CVA, intracranial hemorrhage, hepatic encephalopathy, infectious etiology, meningitis, encephalitis   Patient's presentation is most consistent with acute presentation with potential threat to life or bodily function.   PLAN: We will obtain CBC, CMP, ethanol level, Tylenol salicylate levels, urine drug screen.  He is under full IVC.  Will consult TTS and psychiatry.   MEDICATIONS GIVEN IN ED: Medications  amLODipine (NORVASC) tablet 10 mg (has no administration in time range)  divalproex (DEPAKOTE) DR tablet 500 mg (has no administration in time range)     ED COURSE: Patient's labs are unremarkable.  No leukocytosis.  Normal hemoglobin.  Normal electrolytes.  Minimally elevated AST and ALT but otherwise normal alkaline phosphatase and bilirubin.  Tylenol and salicylate levels negative.  Ethanol level negative.  Depakote level negative.   CONSULTS: TTS and psychiatry consulted for further evaluation and disposition.   OUTSIDE RECORDS REVIEWED: Reviewed patient's last psychiatric admission on  12/03/2021.       FINAL CLINICAL IMPRESSION(S) / ED DIAGNOSES   Final diagnoses:  Involuntary commitment     Rx / DC Orders   ED Discharge Orders     None        Note:  This document was prepared using Dragon voice recognition software and may include unintentional dictation errors.   Shianna Bally, Layla Maw, DO 03/30/22 [phone]

## 2022-03-30 NOTE — ED Notes (Signed)
Received report from Nurse int he quad, patient transferred to room 8, no signs of distress, He is cooperative.

## 2022-03-30 NOTE — Consult Note (Signed)
[facility] Face-to-Face Psychiatry Consult   Reason for Consult:  Confusion Referring Physician:  EDP Patient Identification: [name] [name] MRN:  [id number] Principal Diagnosis: Schizoaffective disorder, bipolar type (HCC) Diagnosis:  Principal Problem:   Schizoaffective disorder, bipolar type (HCC)  Total Time spent with patient: 15 minutes  Subjective:   [name] [name] is a 26 y.o. male patient stated, "I was just walking."  HPI: 26 y/o male admitted to ED after found by police walking in [place] parking lot attempting to open car doors, stating they were his vehicles. Patient with history of schizoaffective disorder, bipolar type. Previous psychiatric admissions with worsening psychosis, confused behavior, poor self-care. History of non-compliance.  Inpatient psychiatric admission needed.  He has [employer] as his primary provider/ACT team.  In ED, patient was oriented to person, place, time. Presented as calm, cooperative, verbally responsive to questions, but with little insight (patient stated, "I was just walking").   Patient denies everything, including suicidal/homicidal ideations, hallucinations/delusions. Awaiting collateral confirmation of patient report.  Awaiting collateral information from his mother.  Past Psychiatric History: Previously diagnosis of schizoaffective disorder, bipolar type. Patient has a long history of schizophrenia with multiple hospitalizations.  He has ACT team services.  He has done well in the past when he was on a combination of clozapine and Depakote, especially when long-acting injectables were added on top of that.  When he decompensates, he often gets nearly catatonic and very paranoid.  He does have a past history of some self-threatening behavior.  Risk to Self: not observed Risk to Others: not observed  Past Medical History:  Past Medical History:  Diagnosis Date   Asthma    Depression    Psychosis (HCC)    Schizoaffective disorder  (HCC)     Past Surgical History:  Procedure Laterality Date   BACK SURGERY     I&D groin  2017   Family History: No family history on file. Family Psychiatric  History: not known Social History:   Social History   Substance and Sexual Activity  Alcohol Use No     Social History   Substance and Sexual Activity  Drug Use Never    Social History   Socioeconomic History   Marital status: Single    Spouse name: Not on file   Number of children: Not on file   Years of education: Not on file   Highest education level: Not on file  Occupational History   Not on file  Tobacco Use   Smoking status: Never   Smokeless tobacco: Never  Vaping Use   Vaping Use: Some days  Substance and Sexual Activity   Alcohol use: No   Drug use: Never   Sexual activity: Not Currently  Other Topics Concern   Not on file  Social History Narrative   Not on file   Social Determinants of Health   Financial Resource Strain: Not on file  Food Insecurity: Not on file  Transportation Needs: Not on file  Physical Activity: Not on file  Stress: Not on file  Social Connections: Not on file   Additional Social History:    Allergies:  No Known Allergies  Labs:  Results for orders placed or performed during the hospital encounter of 03/30/22 (from the past 48 hour(s))  Comprehensive metabolic panel     Status: Abnormal   Collection Time: 03/30/22  5:19 AM  Result Value Ref Range   Sodium 139 135 - 145 mmol/L   Potassium 3.6 3.5 - 5.1 mmol/L  Chloride 107 98 - 111 mmol/L   CO2 23 22 - 32 mmol/L   Glucose, Bld 96 70 - 99 mg/dL    Comment: Glucose reference range applies only to samples taken after fasting for at least 8 hours.   BUN 17 6 - 20 mg/dL   Creatinine, Ser 6.14 0.61 - 1.24 mg/dL   Calcium 9.4 8.9 - 43.1 mg/dL   Total Protein 7.4 6.5 - 8.1 g/dL   Albumin 4.3 3.5 - 5.0 g/dL   AST 50 (H) 15 - 41 U/L   ALT 70 (H) 0 - 44 U/L   Alkaline Phosphatase 87 38 - 126 U/L   Total  Bilirubin 0.4 0.3 - 1.2 mg/dL   GFR, Estimated >54 >00 mL/min    Comment: (NOTE) Calculated using the CKD-EPI Creatinine Equation (2021)    Anion gap 9 5 - 15    Comment: Performed at [facility], [address] [place], [place] [address]  Ethanol     Status: None   Collection Time: 03/30/22  5:19 AM  Result Value Ref Range   Alcohol, Ethyl (B) <10 <10 mg/dL    Comment: (NOTE) Lowest detectable limit for serum alcohol is 10 mg/dL.  For medical purposes only. Performed at [facility], [address] [place], [place] [address]   Salicylate level     Status: Abnormal   Collection Time: 03/30/22  5:19 AM  Result Value Ref Range   Salicylate Lvl <7.0 (L) 7.0 - 30.0 mg/dL    Comment: Performed at [facility], [address] [place], [place] [address]  Acetaminophen level     Status: Abnormal   Collection Time: 03/30/22  5:19 AM  Result Value Ref Range   Acetaminophen (Tylenol), Serum <10 (L) 10 - 30 ug/mL    Comment: (NOTE) Therapeutic concentrations vary significantly. A range of 10-30 ug/mL  may be an effective concentration for many patients. However, some  are best treated at concentrations outside of this range. Acetaminophen concentrations >150 ug/mL at 4 hours after ingestion  and >50 ug/mL at 12 hours after ingestion are often associated with  toxic reactions.  Performed at [facility], [address] [place], [place] [address]   cbc     Status: None   Collection Time: 03/30/22  5:19 AM  Result Value Ref Range   WBC 8.7 4.0 - 10.5 K/uL   RBC 5.11 4.22 - 5.81 MIL/uL   Hemoglobin 14.0 13.0 - 17.0 g/dL   HCT 98.3 38.2 - 50.5 %   MCV 83.6 80.0 - 100.0 fL   MCH 27.4 26.0 - 34.0 pg   MCHC 32.8 30.0 - 36.0 g/dL   RDW 39.7 67.3 - 41.9 %   Platelets 353 150 - 400 K/uL   nRBC 0.0 0.0 - 0.2 %    Comment: Performed at [facility], [address] [place], [place] [address]  Valproic acid level     Status: Abnormal    Collection Time: 03/30/22  5:19 AM  Result Value Ref Range   Valproic Acid Lvl <10 (L) 50.0 - 100.0 ug/mL    Comment: Performed at [facility], [address] [place], [place] [address]    Current Facility-Administered Medications  Medication Dose Route Frequency Provider Last Rate Last Admin   amLODipine (NORVASC) tablet 10 mg  10 mg Oral Daily Ward, Kristen N, DO   10 mg at 03/30/22 0936   divalproex (DEPAKOTE) DR tablet 500 mg  500 mg Oral  Q12H Ward, Layla Maw, DO   500 mg at 03/30/22 4098   Current Outpatient Medications  Medication Sig Dispense Refill   amLODipine (NORVASC) 10 MG tablet Take 1 tablet (10 mg total) by mouth daily. (Patient not taking: Reported on 03/30/2022) 7 tablet 0   clozapine (CLOZARIL) 200 MG tablet Take 1 tablet (200 mg total) by mouth at bedtime. (Patient not taking: Reported on 03/30/2022) 7 tablet 0   divalproex (DEPAKOTE ER) 500 MG 24 hr tablet Take 500 mg by mouth 2 (two) times daily. (Patient not taking: Reported on 03/30/2022)     divalproex (DEPAKOTE) 500 MG DR tablet Take 1 tablet (500 mg total) by mouth every 12 (twelve) hours. (Patient not taking: Reported on 03/30/2022) 14 tablet 0   paliperidone (INVEGA SUSTENNA) 234 MG/1.5ML injection Inject 234 mg into the muscle every 28 (twenty-eight) days. (Patient not taking: Reported on 03/30/2022) 1.5 mL 1    Musculoskeletal: Strength & Muscle Tone: within normal limits Gait & Station: not assessed; in bed Patient leans: not assessed; in bed  Psychiatric Specialty Exam: Physical Exam Vitals and nursing note reviewed.  Constitutional:      General: He is not in acute distress.    Appearance: Normal appearance. He is not ill-appearing, toxic-appearing or diaphoretic.  HENT:     Head: Normocephalic.     Nose: Nose normal.  Pulmonary:     Effort: Pulmonary effort is normal.  Musculoskeletal:        General: Normal range of motion.     Cervical back: Normal range of motion.  Neurological:      General: No focal deficit present.     Mental Status: He is alert and oriented to person, place, and time.     Review of Systems  Psychiatric/Behavioral:  The patient is nervous/anxious.   All other systems reviewed and are negative.   Blood pressure 110/64, pulse 82, temperature 98.6 F (37 C), resp. rate 20, SpO2 97 %.There is no height or weight on file to calculate BMI.  General Appearance: Casual  Eye Contact:  Fair  Speech:  Clear and Coherent  Volume:  Decreased  Mood:  Euthymic  Affect:  Blunt  Thought Process:  Coherent  Orientation:  Full (Time, Place, and Person)  Thought Content:  Logical  Suicidal Thoughts:  No  Homicidal Thoughts:  No  Memory:  Immediate;   Good Recent;   Good  Judgement:  Fair  Insight:  Lacking  Psychomotor Activity:  Normal  Concentration:  Concentration: Good  Recall:  Poor  Fund of Knowledge:  Poor  Language:  Good  Akathisia:  No  Handed:    AIMS (if indicated):     Assets:  Housing Social Support  ADL's:  Intact  Cognition:    Sleep:        Physical Exam: Physical Exam Vitals and nursing note reviewed.  Constitutional:      General: He is not in acute distress.    Appearance: Normal appearance. He is not ill-appearing, toxic-appearing or diaphoretic.  HENT:     Head: Normocephalic.     Nose: Nose normal.  Pulmonary:     Effort: Pulmonary effort is normal.  Musculoskeletal:        General: Normal range of motion.     Cervical back: Normal range of motion.  Neurological:     General: No focal deficit present.     Mental Status: He is alert and oriented to person, place, and time.    Review  of Systems  Psychiatric/Behavioral:  The patient is nervous/anxious.   All other systems reviewed and are negative.  Blood pressure 110/64, pulse 82, temperature 98.6 F (37 C), resp. rate 20, SpO2 97 %. There is no height or weight on file to calculate BMI.  Treatment Plan Summary: Diagnosis: schizoaffective disorder;  bipolar: Depakote 500 mg BID Clozaril 50 mg started and will continue to titrate to his 200 mg  Invega 3 mg daily    Disposition: Patient does not meet criteria for psychiatric inpatient admission. Discussed crisis plan, support from social network, calling 911, coming to the Emergency Department, and calling Suicide Hotline.  Nanine Means, NP 03/30/2022 9:58 AM

## 2022-03-30 NOTE — ED Notes (Signed)
Snack and beverage given. 

## 2022-03-30 NOTE — ED Notes (Signed)
Report to include Situation, Background, Assessment, and Recommendations received from Antacia RN. Patient alert and oriented, warm and dry, in no acute distress. Patient denies SI, HI, AVH and pain. Patient made aware of Q15 minute rounds and security cameras for their safety. Patient instructed to come to me with needs or concerns.  

## 2022-03-30 NOTE — ED Notes (Signed)
Hospital meal provided, pt tolerated w/o complaints.  Waste discarded appropriately.  

## 2022-03-30 NOTE — BH Assessment (Signed)
Referral information for Psychiatric Hospitalization faxed to;   [facility] ([phone]-or- [phone]), no beds  [facility] ([phone]-or- [phone]),   [facility] ([phone]---[phone]),  [facility] ([phone], [phone], [phone] or [phone]),   [facility] ([phone]--- [phone]--- [phone]--- [phone])  [facility] ([phone]),   [facility] ([phone] -or- [phone]),    [facility] ([phone])  [facility] ([phone]).  [facility] ([phone]) 

## 2022-03-30 NOTE — BH Assessment (Signed)
Comprehensive Clinical Assessment (CCA) Note  03/30/2022 [name] [name] [id number]  Chief Complaint:  Chief Complaint  Patient presents with   Psychiatric Evaluation   Visit Diagnosis: Schizoaffective disorder, bipolar type    [name] [name] is a 26 year old male who presents to the ER via law enforcement, after he was found in a store parking lot, attempting to open different car doors. He told the officers the cars belonged to him. They notice he was confused and difficult to redirect and engaged. They thought he was impaired and brought him to the ER. Patient has history of mental illness and it was reported he was not taking his medications.  During the interview, the patient was calm, cooperative and pleasant. He was able to provide appropriate answers to majority of the questions. He denies the use of mind-altering substance. He also denies SI/HI.  CCA Screening, Triage and Referral (STR)  Patient Reported Information How did you hear about [place]? Legal System  What Is the Reason for Your Visit/Call Today? Patient was found in a parking lot, confused, trying to get into cars, while saying they belong to him.  How Long Has This Been Causing You Problems? 1 wk - 1 month  What Do You Feel Would Help You the Most Today? Treatment for Depression or other mood problem   Have You Recently Had Any Thoughts About Hurting Yourself? No  Are You Planning to Commit Suicide/Harm Yourself At This time? No   Have you Recently Had Thoughts About Hurting Someone [name] [name]? No  Are You Planning to Harm Someone at This Time? No  Explanation: No data recorded  Have You Used Any Alcohol or Drugs in the Past 24 Hours? No  How Long Ago Did You Use Drugs or Alcohol? No data recorded What Did You Use and How Much? No data recorded  Do You Currently Have a Therapist/Psychiatrist? Yes  Name of Therapist/Psychiatrist: [facility]   Have You Been Recently Discharged From Any Office Practice  or Programs? No  Explanation of Discharge From Practice/Program: No data recorded    CCA Screening Triage Referral Assessment Type of Contact: Face-to-Face  Telemedicine Service Delivery:   Is this Initial or Reassessment? No data recorded Date Telepsych consult ordered in [facility]:  No data recorded Time Telepsych consult ordered in [facility]:  No data recorded Location of Assessment: [facility] ED  Provider Location: [facility] ED   Collateral Involvement: [name] [name] ACT team Nurse   Does Patient Have a Automotive engineer Guardian? No data recorded Name and Contact of Legal Guardian: No data recorded If Minor and Not Living with Parent(s), Who has Custody? n/a  Is CPS involved or ever been involved? Never  Is APS involved or ever been involved? Never   Patient Determined To Be At Risk for Harm To Self or Others Based on Review of Patient Reported Information or Presenting Complaint? No  Method: No data recorded Availability of Means: No data recorded Intent: No data recorded Notification Required: No data recorded Additional Information for Danger to Others Potential: No data recorded Additional Comments for Danger to Others Potential: No data recorded Are There Guns or Other Weapons in Your Home? No data recorded Types of Guns/Weapons: No data recorded Are These Weapons Safely Secured?                            No data recorded Who Could Verify You Are Able To Have These Secured: No data  recorded Do You Have any Outstanding Charges, Pending Court Dates, Parole/Probation? No data recorded Contacted To Inform of Risk of Harm To Self or Others: Other: Comment    Does Patient Present under Involuntary Commitment? Yes  IVC Papers Initial File Date: 03/30/22   [place] of Residence: [place]   Patient Currently Receiving the Following Services: ACTT Psychologist, educational)   Determination of Need: Emergent (2 hours)   Options For Referral: ED Visit     CCA  Biopsychosocial Patient Reported Schizophrenia/Schizoaffective Diagnosis in Past: Yes   Strengths: Have a support system, stable housing and recieves outpaitnet treatment.   Mental Health Symptoms Depression:   Change in energy/activity   Duration of Depressive symptoms:  Duration of Depressive Symptoms: Less than two weeks   Mania:   Racing thoughts; Overconfidence   Anxiety:    Difficulty concentrating; Sleep   Psychosis:   None   Duration of Psychotic symptoms:    Trauma:   N/A   Obsessions:   N/A   Compulsions:   N/A   Inattention:   N/A   Hyperactivity/Impulsivity:   N/A   Oppositional/Defiant Behaviors:   N/A   Emotional Irregularity:   N/A   Other Mood/Personality Symptoms:  No data recorded   Mental Status Exam Appearance and self-care  Stature:   Average   Weight:   Average weight   Clothing:   Neat/clean   Grooming:   Normal   Cosmetic use:   None   Posture/gait:   Normal   Motor activity:   -- (within normal range)   Sensorium  Attention:   Normal   Concentration:   Normal   Orientation:   Time; Place; Person   Recall/memory:   Normal   Affect and Mood  Affect:   Anxious   Mood:   Anxious   Relating  Eye contact:   Normal   Facial expression:   Responsive; Anxious   Attitude toward examiner:   Cooperative   Thought and Language  Speech flow:  Clear and Coherent   Thought content:   Appropriate to Mood and Circumstances   Preoccupation:   None   Hallucinations:   None   Organization:  No data recorded  [employer] of Knowledge:   Fair   Intelligence:   Average   Abstraction:   Normal   Judgement:   Fair   Reality Testing:   Distorted   Insight:   Poor; Fair   Decision Making:   Impulsive   Social Functioning  Social Maturity:   Impulsive; Isolates   Social Judgement:   Heedless   Stress  Stressors:   Other (Comment)   Coping Ability:   Normal    Skill Deficits:   Decision making   Supports:   Family     Religion: Religion/Spirituality Are You A Religious Person?: No  Leisure/Recreation: Leisure / Recreation Do You Have Hobbies?: No  Exercise/Diet: Exercise/Diet Do You Exercise?: No Have You Gained or Lost A Significant Amount of Weight in the Past Six Months?: No Do You Follow a Special Diet?: No Do You Have Any Trouble Sleeping?: No   CCA Employment/Education Employment/Work Situation: Employment / Work Situation Employment Situation: On disability Why is Patient on Disability: Mental Health Patient's Job has Been Impacted by Current Illness: No  Education: Education Is Patient Currently Attending School?: No Did You Product manager?: Yes What Type of College Degree Do you Have?: Freshman Year Did You Have An Individualized Education Program (IIEP): No Did  You Have Any Difficulty At School?: No Patient's Education Has Been Impacted by Current Illness: No   CCA Family/Childhood History Family and Relationship History: Family history Marital status: Single Does patient have children?: No  Childhood History:  Childhood History By whom was/is the patient raised?: Mother Did patient suffer any verbal/emotional/physical/sexual abuse as a child?: No Did patient suffer from severe childhood neglect?: No Has patient ever been sexually abused/assaulted/raped as an adolescent or adult?: No Was the patient ever a victim of a crime or a disaster?: No Witnessed domestic violence?: No Has patient been affected by domestic violence as an adult?: No  Child/Adolescent Assessment:     CCA Substance Use Alcohol/Drug Use: Alcohol / Drug Use Pain Medications: See PTA Prescriptions: See PTA Over the Counter: See PTA History of alcohol / drug use?: No history of alcohol / drug abuse Longest period of sobriety (when/how long): n/a                         ASAM's:  Six Dimensions of Multidimensional  Assessment  Dimension 1:  Acute Intoxication and/or Withdrawal Potential:      Dimension 2:  Biomedical Conditions and Complications:      Dimension 3:  Emotional, Behavioral, or Cognitive Conditions and Complications:     Dimension 4:  Readiness to Change:     Dimension 5:  Relapse, Continued use, or Continued Problem Potential:     Dimension 6:  Recovery/Living Environment:     ASAM Severity Score:    ASAM Recommended Level of Treatment:     Substance use Disorder (SUD)    Recommendations for Services/Supports/Treatments:    Discharge Disposition:    DSM5 Diagnoses: Patient Active Problem List   Diagnosis Date Noted   Hypertension 03/06/2018   Schizoaffective disorder, bipolar type (HCC) 12/30/2017     Referrals to Alternative Service(s): Referred to Alternative Service(s):   Place:   Date:   Time:    Referred to Alternative Service(s):   Place:   Date:   Time:    Referred to Alternative Service(s):   Place:   Date:   Time:    Referred to Alternative Service(s):   Place:   Date:   Time:     Morley Kos, Counselor

## 2022-03-30 NOTE — ED Notes (Signed)
Patient refusing to answer questions at this time.  IVC paper work state that patient was walking through [employer] parking lot attempting to open car door and when approached by PD stated the cars were his.

## 2022-03-30 NOTE — ED Notes (Signed)
BREAKFAST TRAY GIVEN 

## 2022-03-30 NOTE — ED Triage Notes (Addendum)
Pt comes to ED IVC'd accompanied by [facility] PD. Officers state he was walking around [employer] pulling on car doors, stating they were his. Pt would have periods of calmness and then aggression when asked what he was doing. Pt does not know time, place. Denies SI, HI

## 2022-03-31 LAB — URINALYSIS, ROUTINE W REFLEX MICROSCOPIC
Bilirubin Urine: NEGATIVE
Glucose, UA: NEGATIVE mg/dL
Hgb urine dipstick: NEGATIVE
Ketones, ur: NEGATIVE mg/dL
Leukocytes,Ua: NEGATIVE
Nitrite: NEGATIVE
Protein, ur: NEGATIVE mg/dL
Specific Gravity, Urine: 1.024 (ref 1.005–1.030)
pH: 5 (ref 5.0–8.0)

## 2022-03-31 LAB — URINE DRUG SCREEN, QUALITATIVE (ARMC ONLY)
Amphetamines, Ur Screen: NOT DETECTED
Barbiturates, Ur Screen: NOT DETECTED
Benzodiazepine, Ur Scrn: POSITIVE — AB
Cannabinoid 50 Ng, Ur ~~LOC~~: NOT DETECTED
Cocaine Metabolite,Ur ~~LOC~~: NOT DETECTED
MDMA (Ecstasy)Ur Screen: NOT DETECTED
Methadone Scn, Ur: NOT DETECTED
Opiate, Ur Screen: NOT DETECTED
Phencyclidine (PCP) Ur S: NOT DETECTED
Tricyclic, Ur Screen: POSITIVE — AB

## 2022-03-31 MED ORDER — OLANZAPINE 10 MG IM SOLR
10.0000 mg | Freq: Once | INTRAMUSCULAR | Status: DC
  Filled 2022-03-31: qty 10

## 2022-03-31 MED ORDER — CLOZAPINE 25 MG PO TABS
50.0000 mg | ORAL_TABLET | Freq: Once | ORAL | Status: DC
  Filled 2022-03-31: qty 2

## 2022-03-31 MED ORDER — CLOZAPINE 25 MG PO TABS: 250.0000 mg | ORAL_TABLET | Freq: Every day | ORAL | Status: DC

## 2022-03-31 MED ORDER — PALIPERIDONE ER 3 MG PO TB24
6.0000 mg | ORAL_TABLET | Freq: Every day | ORAL | Status: DC
  Administered 2022-04-01: 6 mg via ORAL
  Filled 2022-03-31: qty 2

## 2022-03-31 MED ORDER — DIPHENHYDRAMINE HCL 50 MG/ML IJ SOLN
50.0000 mg | Freq: Once | INTRAMUSCULAR | Status: DC
  Filled 2022-03-31: qty 1

## 2022-03-31 MED ORDER — NICOTINE POLACRILEX 2 MG MT GUM: 2.0000 mg | CHEWING_GUM | OROMUCOSAL | Status: DC | PRN

## 2022-03-31 MED ORDER — CLOZAPINE 100 MG PO TABS: 200.0000 mg | ORAL_TABLET | Freq: Every day | ORAL | Status: DC

## 2022-03-31 MED ORDER — CLOZAPINE 25 MG PO TABS: 150.0000 mg | ORAL_TABLET | Freq: Every day | ORAL | Status: DC

## 2022-03-31 NOTE — BH Assessment (Signed)
Writer called [facility] [phone]) and informed them the patient can not come until tomorrow due to no transportation.

## 2022-03-31 NOTE — ED Provider Notes (Signed)
Emergency Medicine Observation Re-evaluation Note  Royale A Gonzales is a 26 y.o. male, seen on rounds today.  Pt initially presented to the ED for complaints of Psychiatric Evaluation Currently, the patient is sitting in a chair without any complaints, reportedly has been jumping and skipping about the room per nursing.  Physical Exam  BP 128/80 (BP Location: Left Arm)   Pulse 81   Temp 97.7 F (36.5 C) (Oral)   Resp 18   SpO2 99%  Physical Exam Constitutional: Resting comfortably. Eyes: Conjunctivae are normal. Head: Atraumatic. Nose: No congestion/rhinnorhea. Mouth/Throat: Mucous membranes are moist. Neck: Normal ROM Cardiovascular: No cyanosis noted. Respiratory: Normal respiratory effort. Gastrointestinal: Non-distended. Genitourinary: deferred Musculoskeletal: No lower extremity tenderness nor edema. Neurologic:  Normal speech and language. No gross focal neurologic deficits are appreciated. Skin:  Skin is warm, dry and intact. No rash noted.   ED Course / MDM  EKG:   I have reviewed the labs performed to date as well as medications administered while in observation.  Recent changes in the last 24 hours include none.  Plan  Current plan is for psychiatric admission pending bed availability.    Chesley Noon, MD 03/31/22 1048

## 2022-03-31 NOTE — Consult Note (Signed)
[facility] Face-to-Face Psychiatry Consult   Reason for Consult:  psychosis Referring Physician:  EDP Patient Identification: [name] [name] MRN:  [id number] Principal Diagnosis: Schizoaffective disorder, bipolar type (HCC) Diagnosis:  Principal Problem:   Schizoaffective disorder, bipolar type (HCC)  Total Time spent with patient: 30 minutes  Subjective:   [name] [name] is a 26 y.o. male patient admitted with psychosis.  HPI:  26 yo male with schizoaffective disorder, bipolar type presented to the ED confused and trying to get in other people's cars.  Pleasantly, hyper this morning, exercising on the unit.  Responding to internal stimuli, minimal responses received.  Psychiatric admission is needed from him to stabilize at this time.  He lives with his mother.  Past Psychiatric History: schizoaffective disorder  Risk to Self:  yes Risk to Others:  none Prior Inpatient Therapy:  multiple Prior Outpatient Therapy:  unknown  Past Medical History:  Past Medical History:  Diagnosis Date   Asthma    Depression    Psychosis (HCC)    Schizoaffective disorder (HCC)     Past Surgical History:  Procedure Laterality Date   BACK SURGERY     I&D groin  2017   Family History: No family history on file. Family Psychiatric  History: none Social History:  Social History   Substance and Sexual Activity  Alcohol Use No     Social History   Substance and Sexual Activity  Drug Use Never    Social History   Socioeconomic History   Marital status: Single    Spouse name: Not on file   Number of children: Not on file   Years of education: Not on file   Highest education level: Not on file  Occupational History   Not on file  Tobacco Use   Smoking status: Never   Smokeless tobacco: Never  Vaping Use   Vaping Use: Some days  Substance and Sexual Activity   Alcohol use: No   Drug use: Never   Sexual activity: Not Currently  Other Topics Concern   Not on file  Social History  Narrative   Not on file   Social Determinants of Health   Financial Resource Strain: Not on file  Food Insecurity: Not on file  Transportation Needs: Not on file  Physical Activity: Not on file  Stress: Not on file  Social Connections: Not on file   Additional Social History:    Allergies:  No Known Allergies  Labs:  Results for orders placed or performed during the hospital encounter of 03/30/22 (from the past 48 hour(s))  Comprehensive metabolic panel     Status: Abnormal   Collection Time: 03/30/22  5:19 AM  Result Value Ref Range   Sodium 139 135 - 145 mmol/L   Potassium 3.6 3.5 - 5.1 mmol/L   Chloride 107 98 - 111 mmol/L   CO2 23 22 - 32 mmol/L   Glucose, Bld 96 70 - 99 mg/dL    Comment: Glucose reference range applies only to samples taken after fasting for at least 8 hours.   BUN 17 6 - 20 mg/dL   Creatinine, Ser 3.61 0.61 - 1.24 mg/dL   Calcium 9.4 8.9 - 44.3 mg/dL   Total Protein 7.4 6.5 - 8.1 g/dL   Albumin 4.3 3.5 - 5.0 g/dL   AST 50 (H) 15 - 41 U/L   ALT 70 (H) 0 - 44 U/L   Alkaline Phosphatase 87 38 - 126 U/L   Total Bilirubin 0.4 0.3 - 1.2  mg/dL   GFR, Estimated >26 >94 mL/min    Comment: (NOTE) Calculated using the CKD-EPI Creatinine Equation (2021)    Anion gap 9 5 - 15    Comment: Performed at [facility], [address] [place], [place] [address]  Ethanol     Status: None   Collection Time: 03/30/22  5:19 AM  Result Value Ref Range   Alcohol, Ethyl (B) <10 <10 mg/dL    Comment: (NOTE) Lowest detectable limit for serum alcohol is 10 mg/dL.  For medical purposes only. Performed at [facility], [address] [place], [place] [address]   Salicylate level     Status: Abnormal   Collection Time: 03/30/22  5:19 AM  Result Value Ref Range   Salicylate Lvl <7.0 (L) 7.0 - 30.0 mg/dL    Comment: Performed at [facility], [address] [place], [place] [address]  Acetaminophen level     Status: Abnormal    Collection Time: 03/30/22  5:19 AM  Result Value Ref Range   Acetaminophen (Tylenol), Serum <10 (L) 10 - 30 ug/mL    Comment: (NOTE) Therapeutic concentrations vary significantly. A range of 10-30 ug/mL  may be an effective concentration for many patients. However, some  are best treated at concentrations outside of this range. Acetaminophen concentrations >150 ug/mL at 4 hours after ingestion  and >50 ug/mL at 12 hours after ingestion are often associated with  toxic reactions.  Performed at [facility], [address] [place], [place] [address]   cbc     Status: None   Collection Time: 03/30/22  5:19 AM  Result Value Ref Range   WBC 8.7 4.0 - 10.5 K/uL   RBC 5.11 4.22 - 5.81 MIL/uL   Hemoglobin 14.0 13.0 - 17.0 g/dL   HCT 71.6 96.7 - 89.3 %   MCV 83.6 80.0 - 100.0 fL   MCH 27.4 26.0 - 34.0 pg   MCHC 32.8 30.0 - 36.0 g/dL   RDW 81.0 17.5 - 10.2 %   Platelets 353 150 - 400 K/uL   nRBC 0.0 0.0 - 0.2 %    Comment: Performed at [facility], [address] [place], [place] [address]  Valproic acid level     Status: Abnormal   Collection Time: 03/30/22  5:19 AM  Result Value Ref Range   Valproic Acid Lvl <10 (L) 50.0 - 100.0 ug/mL    Comment: Performed at [facility], [address] [place], [place] [address]  CBC with Differential/Platelet     Status: None   Collection Time: 03/30/22  5:19 AM  Result Value Ref Range   WBC 8.7 4.0 - 10.5 K/uL   RBC 5.05 4.22 - 5.81 MIL/uL   Hemoglobin 14.1 13.0 - 17.0 g/dL   HCT 35.3 61.4 - 43.1 %   MCV 83.8 80.0 - 100.0 fL   MCH 27.9 26.0 - 34.0 pg   MCHC 33.3 30.0 - 36.0 g/dL   RDW 54.0 08.6 - 76.1 %   Platelets 343 150 - 400 K/uL   nRBC 0.0 0.0 - 0.2 %   Neutrophils Relative % 58 %   Neutro Abs 5.0 1.7 - 7.7 K/uL   Lymphocytes Relative 33 %   Lymphs Abs 2.9 0.7 - 4.0 K/uL   Monocytes Relative 9 %   Monocytes Absolute 0.8 0.1 - 1.0 K/uL   Eosinophils Relative 0 %   Eosinophils Absolute 0.0 0.0 -  0.5 K/uL   Basophils Relative 0 %  Basophils Absolute 0.0 0.0 - 0.1 K/uL   Immature Granulocytes 0 %   Abs Immature Granulocytes 0.03 0.00 - 0.07 K/uL    Comment: Performed at [facility], [address] [place], [place] [address]    Current Facility-Administered Medications  Medication Dose Route Frequency Provider Last Rate Last Admin   amLODipine (NORVASC) tablet 10 mg  10 mg Oral Daily Ward, Kristen N, DO   10 mg at 03/30/22 0936   cloZAPine (CLOZARIL) tablet 50 mg  50 mg Oral Daily Charm Rings, NP   50 mg at 03/30/22 1424   divalproex (DEPAKOTE) DR tablet 500 mg  500 mg Oral Q12H Ward, Kristen N, DO   500 mg at 03/30/22 2131   paliperidone (INVEGA) 24 hr tablet 3 mg  3 mg Oral Daily Charm Rings, NP   3 mg at 03/30/22 1424   Current Outpatient Medications  Medication Sig Dispense Refill   amLODipine (NORVASC) 10 MG tablet Take 1 tablet (10 mg total) by mouth daily. (Patient not taking: Reported on 03/30/2022) 7 tablet 0   clozapine (CLOZARIL) 200 MG tablet Take 1 tablet (200 mg total) by mouth at bedtime. (Patient not taking: Reported on 03/30/2022) 7 tablet 0   divalproex (DEPAKOTE ER) 500 MG 24 hr tablet Take 500 mg by mouth 2 (two) times daily. (Patient not taking: Reported on 03/30/2022)     divalproex (DEPAKOTE) 500 MG DR tablet Take 1 tablet (500 mg total) by mouth every 12 (twelve) hours. (Patient not taking: Reported on 03/30/2022) 14 tablet 0   paliperidone (INVEGA SUSTENNA) 234 MG/1.5ML injection Inject 234 mg into the muscle every 28 (twenty-eight) days. (Patient not taking: Reported on 03/30/2022) 1.5 mL 1    Musculoskeletal: Strength & Muscle Tone: within normal limits Gait & Station: normal Patient leans: N/A  Psychiatric Specialty Exam: Physical Exam Vitals and nursing note reviewed.  Constitutional:      Appearance: Normal appearance.  HENT:     Head: Normocephalic.     Nose: Nose normal.  Pulmonary:     Effort: Pulmonary effort is normal.   Musculoskeletal:        General: Normal range of motion.     Cervical back: Normal range of motion.  Neurological:     General: No focal deficit present.     Mental Status: He is alert and oriented to person, place, and time.  Psychiatric:        Attention and Perception: He is inattentive. He perceives auditory and visual hallucinations.        Mood and Affect: Mood is anxious.        Speech: Speech is tangential.        Behavior: Behavior is hyperactive.        Thought Content: Thought content normal.        Cognition and Memory: Cognition is impaired.        Judgment: Judgment is impulsive.     Review of Systems  Psychiatric/Behavioral:  Positive for hallucinations. The patient is nervous/anxious.   All other systems reviewed and are negative.   Blood pressure 96/61, pulse 82, temperature 98.5 F (36.9 C), temperature source Oral, resp. rate 20, SpO2 100 %.There is no height or weight on file to calculate BMI.  General Appearance: Casual  Eye Contact:  Fair  Speech:  Normal Rate  Volume:  Normal  Mood:  Anxious  Affect:  Appropriate  Thought Process:  Descriptions of Associations: Tangential  Orientation:  Full (Time, Place,  and Person)  Thought Content:  Delusions and Hallucinations: Auditory  Suicidal Thoughts:  No  Homicidal Thoughts:  No  Memory:  Immediate;   Fair Recent;   Fair Remote;   Fair  Judgement:  Poor  Insight:  Lacking  Psychomotor Activity:  Increased  Concentration:  Concentration: Fair and Attention Span: Fair  Recall:  [employer] of Knowledge:  Fair  Language:  Good  Akathisia:  No  Handed:  Right  AIMS (if indicated):     Assets:  Housing Leisure Time Physical Health Resilience Social Support  ADL's:  Intact  Cognition:  Impaired,  Mild  Sleep:        Physical Exam: Physical Exam Vitals and nursing note reviewed.  Constitutional:      Appearance: Normal appearance.  HENT:     Head: Normocephalic.     Nose: Nose normal.   Pulmonary:     Effort: Pulmonary effort is normal.  Musculoskeletal:        General: Normal range of motion.     Cervical back: Normal range of motion.  Neurological:     General: No focal deficit present.     Mental Status: He is alert and oriented to person, place, and time.  Psychiatric:        Attention and Perception: He is inattentive. He perceives auditory and visual hallucinations.        Mood and Affect: Mood is anxious.        Speech: Speech is tangential.        Behavior: Behavior is hyperactive.        Thought Content: Thought content normal.        Cognition and Memory: Cognition is impaired.        Judgment: Judgment is impulsive.    Review of Systems  Psychiatric/Behavioral:  Positive for hallucinations. The patient is nervous/anxious.   All other systems reviewed and are negative.  Blood pressure 96/61, pulse 82, temperature 98.5 F (36.9 C), temperature source Oral, resp. rate 20, SpO2 100 %. There is no height or weight on file to calculate BMI.  Treatment Plan Summary: Daily contact with patient to assess and evaluate symptoms and progress in treatment, Medication management, and Plan : Schizoaffective disorder, bipolar type: Depakote 500 mg BID Clozaril titrating from 50 to 100 mg today Invega 3 mg daily  Disposition: Recommend psychiatric Inpatient admission when medically cleared.  Nanine Means, NP 03/31/2022 9:50 AM

## 2022-03-31 NOTE — ED Notes (Signed)
Patient has bed at [facility] for Monday 04-01-22 after 8:30 am

## 2022-03-31 NOTE — ED Notes (Signed)
Pt seen running sideways with arms up/elbows bent, from his room across day room to bathroom. Twice. Each time, pt walked back calmly to room. Lights are still dimmed and other patients in their rooms at this time.

## 2022-03-31 NOTE — ED Notes (Signed)
Pt given lunch tray and drink at this time. 

## 2022-03-31 NOTE — BH Assessment (Signed)
Patient has been accepted to [facility].  Patient assigned to the adult unit Accepting physician is Dr. Avanell Shackleton.  Call report to [phone].  Representative was [employer].   ER Staff is aware of it:  Ronnie, ER Secretary  Lelon Mast, Patient's Nurse     Writer called and left a HIPPA Compliant message with Mother Jerre Simon [phone]), requesting a return phone call.   Address: [address],  [place], [place] [address]  Patient will need to arrive before 6:30pm, if not he will need to come tomorrow.

## 2022-03-31 NOTE — ED Notes (Signed)
Pt having random verbal outbursts, "draw that shit then!" Again and again with voice raised. Pt pacing in dayroom. PRN meds were ordered by NP.

## 2022-03-31 NOTE — ED Notes (Signed)
Pt given dinner tray and drink at this time. 

## 2022-03-31 NOTE — BH Assessment (Signed)
Writer called and left a HIPPA Compliant message with Mother Jerre Simon [phone]), requesting a return phone call.

## 2022-03-31 NOTE — ED Notes (Addendum)
Pt walking in dayroom.

## 2022-03-31 NOTE — ED Notes (Signed)
Report to include Situation, Background, Assessment, and Recommendations received from  [name]. Patient alert and oriented, warm and dry, in no acute distress. Patient denies SI, HI, AVH and pain. Patient made aware of Q15 minute rounds and security cameras for their safety. Patient instructed to come to me with needs or concerns.  

## 2022-03-31 NOTE — ED Notes (Signed)
Patient is IVC pending inpatient admit 

## 2022-03-31 NOTE — ED Notes (Signed)
Snack and beverage given. 

## 2022-03-31 NOTE — ED Notes (Signed)
Pt given shower supplies, in shower at this time. °

## 2022-03-31 NOTE — ED Notes (Signed)
Pt came up to door, staring through window, opened door to ask what pt needed, pt stated "nothing". Pt walking to bathroom.

## 2022-04-01 NOTE — ED Notes (Signed)
Patient accepted to [facility] on Monday assigned to the               adult unit /accepting physician Dr. Avanell Shackleton call report [phone] representative was Tresa Endo

## 2022-04-01 NOTE — ED Notes (Signed)
Breakfast tray and beverage provided 

## 2022-04-01 NOTE — ED Provider Notes (Signed)
Emergency Medicine Observation Re-evaluation Note  [name] [name] is a 26 y.o. male, seen on rounds today.  Pt initially presented to the ED for complaints of Psychiatric Evaluation Currently, the patient is resting.  Physical Exam  BP 134/85 (BP Location: Left Arm)   Pulse 89   Temp 97.6 F (36.4 C) (Oral)   Resp 17   SpO2 99%  Physical Exam General: resting no distress Lungs: unlabored breathing Psych: no agitation  ED Course / MDM  EKG:   I have reviewed the labs performed to date as well as medications administered while in observation.  Recent changes in the last 24 hours include no reported morning events.  Plan  Current plan is for sw dispo.    Pilar Jarvis, MD 04/01/22 [phone]

## 2022-04-12 ENCOUNTER — Other Ambulatory Visit: Payer: Self-pay

## 2022-04-12 ENCOUNTER — Emergency Department
Admission: EM | Admit: 2022-04-12 | Discharge: 2022-04-16 | Disposition: A | Discharge status: Home / Self Care | Payer: No Typology Code available for payment source | Attending: Emergency Medicine | Admitting: Emergency Medicine

## 2022-04-12 DIAGNOSIS — F29 Unspecified psychosis not due to a substance or known physiological condition: Secondary | ICD-10-CM | POA: Insufficient documentation

## 2022-04-12 DIAGNOSIS — Z046 Encounter for general psychiatric examination, requested by authority: Secondary | ICD-10-CM | POA: Diagnosis present

## 2022-04-12 DIAGNOSIS — F250 Schizoaffective disorder, bipolar type: Secondary | ICD-10-CM | POA: Insufficient documentation

## 2022-04-12 DIAGNOSIS — Z20822 Contact with and (suspected) exposure to covid-19: Secondary | ICD-10-CM | POA: Insufficient documentation

## 2022-04-12 LAB — COMPREHENSIVE METABOLIC PANEL
ALT: 19 U/L (ref 0–44)
AST: 19 U/L (ref 15–41)
Albumin: 4.2 g/dL (ref 3.5–5.0)
Alkaline Phosphatase: 67 U/L (ref 38–126)
Anion gap: 8 (ref 5–15)
BUN: 20 mg/dL (ref 6–20)
CO2: 23 mmol/L (ref 22–32)
Calcium: 9.1 mg/dL (ref 8.9–10.3)
Chloride: 108 mmol/L (ref 98–111)
Creatinine, Ser: 0.82 mg/dL (ref 0.61–1.24)
GFR, Estimated: >60 mL/min (ref >60)
Glucose, Bld: 105 mg/dL — ABNORMAL HIGH (ref 70–99)
Potassium: 3.8 mmol/L (ref 3.5–5.1)
Sodium: 139 mmol/L (ref 135–145)
Total Bilirubin: 0.8 mg/dL (ref 0.3–1.2)
Total Protein: 7.3 g/dL (ref 6.5–8.1)

## 2022-04-12 LAB — CBC WITH DIFFERENTIAL/PLATELET
Abs Immature Granulocytes: 0.03 10*3/uL (ref 0.00–0.07)
Basophils Absolute: 0 10*3/uL (ref 0.0–0.1)
Basophils Relative: 0 %
Eosinophils Absolute: 0 10*3/uL (ref 0.0–0.5)
Eosinophils Relative: 0 %
HCT: 43 % (ref 39.0–52.0)
Hemoglobin: 14.4 g/dL (ref 13.0–17.0)
Immature Granulocytes: 0 %
Lymphocytes Relative: 28 %
Lymphs Abs: 2.1 10*3/uL (ref 0.7–4.0)
MCH: 27.9 pg (ref 26.0–34.0)
MCHC: 33.5 g/dL (ref 30.0–36.0)
MCV: 83.3 fL (ref 80.0–100.0)
Monocytes Absolute: 0.6 10*3/uL (ref 0.1–1.0)
Monocytes Relative: 9 %
Neutro Abs: 4.6 10*3/uL (ref 1.7–7.7)
Neutrophils Relative %: 63 %
Platelets: 242 10*3/uL (ref 150–400)
RBC: 5.16 MIL/uL (ref 4.22–5.81)
RDW: 12.8 % (ref 11.5–15.5)
WBC: 7.3 10*3/uL (ref 4.0–10.5)
nRBC: 0 % (ref 0.0–0.2)

## 2022-04-12 LAB — RESP PANEL BY RT-PCR (FLU A&B, COVID) ARPGX2
Influenza A by PCR: NEGATIVE
Influenza B by PCR: NEGATIVE
SARS Coronavirus 2 by RT PCR: NEGATIVE

## 2022-04-12 LAB — ETHANOL: Alcohol, Ethyl (B): <10 mg/dL (ref <10)

## 2022-04-12 LAB — VALPROIC ACID LEVEL: Valproic Acid Lvl: 49 ug/mL — ABNORMAL LOW (ref 50.0–100.0)

## 2022-04-12 MED ORDER — DIVALPROEX SODIUM ER 500 MG PO TB24
500.0000 mg | ORAL_TABLET | Freq: Two times a day (BID) | ORAL | Status: DC
  Administered 2022-04-12 – 2022-04-16 (×9): 500 mg via ORAL
  Filled 2022-04-12 (×9): qty 1

## 2022-04-12 MED ORDER — DIVALPROEX SODIUM 500 MG PO DR TAB: 500.0000 mg | DELAYED_RELEASE_TABLET | Freq: Two times a day (BID) | ORAL | Status: DC

## 2022-04-12 MED ORDER — AMLODIPINE BESYLATE 5 MG PO TABS
10.0000 mg | ORAL_TABLET | Freq: Every day | ORAL | Status: DC
  Administered 2022-04-12 – 2022-04-16 (×5): 10 mg via ORAL
  Filled 2022-04-12 (×5): qty 2

## 2022-04-12 MED ORDER — CLOZAPINE 100 MG PO TABS
200.0000 mg | ORAL_TABLET | Freq: Every day | ORAL | Status: DC
  Administered 2022-04-12: 200 mg via ORAL
  Filled 2022-04-12: qty 2

## 2022-04-12 MED ORDER — LORAZEPAM 1 MG PO TABS
1.0000 mg | ORAL_TABLET | Freq: Once | ORAL | Status: AC
  Administered 2022-04-12: 1 mg via ORAL
  Filled 2022-04-12: qty 1

## 2022-04-12 MED ORDER — PALIPERIDONE PALMITATE ER 234 MG/1.5ML IM SUSY
234.0000 mg | PREFILLED_SYRINGE | INTRAMUSCULAR | Status: DC
  Filled 2022-04-12: qty 1.5

## 2022-04-12 NOTE — ED Notes (Signed)
Report to include Situation, Background, Assessment, and Recommendations received from [facility]. Patient alert and oriented, warm and dry, in no acute distress. Patient denies SI and HI.  He appears to be having AVH. Patient made aware of Q15 minute rounds and security cameras for their safety. Patient instructed to come to me with needs or concerns.

## 2022-04-12 NOTE — ED Notes (Signed)
Hospital meal given to pt.  

## 2022-04-12 NOTE — ED Triage Notes (Signed)
Pt arrives with [employer] after being IVC'd. Pt was found at gas station punching the wall. Pt is verbally aggressive with staff in triage. Pt will not answer questions when asked by RN. [employer] able to get pt dressed out. PT refuses VS at this time. Pt has hx of mental health issues.

## 2022-04-12 NOTE — ED Notes (Signed)
Pt moved to [facility] for patient's safety after trying to escape.

## 2022-04-12 NOTE — ED Notes (Signed)
IVC 

## 2022-04-12 NOTE — ED Provider Notes (Signed)
[facility] Provider Note    Event Date/Time   First MD Initiated Contact with Patient 04/12/22 1147     (approximate)   History   Chief Complaint IVC   HPI  [name] [name] is a 26 y.o. male with past medical history of schizoaffective disorder, asthma, and depression who presents to the ED for psychiatric evaluation.  Patient states he is not sure why he is here, currently denies any suicidal or homicidal ideation, also denies any auditory visual hallucinations.  Per IVC paperwork, patient found at a gas station punching the wall and acting erratically.  He was placed under IVC and brought to the ED for further evaluation, noted to be verbally aggressive with staff upon arrival.  He currently denies any medical complaints, denies any substance abuse.  He states he does not take any medications.     Physical Exam   Triage Vital Signs: ED Triage Vitals [04/12/22 1130]  Enc Vitals Group     BP      Pulse      Resp      Temp      Temp src      SpO2      Weight 228 lb (103.4 kg)     Height      Head Circumference      Peak Flow      Pain Score 0     Pain Loc      Pain Edu?      Excl. in GC?     Most recent vital signs: Vitals:   04/12/22 1226  BP: (!) 144/88  Pulse: 85  Resp: 20  Temp: 98.4 F (36.9 C)  SpO2: 98%    Constitutional: Alert and oriented. Eyes: Conjunctivae are normal. Head: Atraumatic. Nose: No congestion/rhinnorhea. Mouth/Throat: Mucous membranes are moist.  Cardiovascular: Normal rate, regular rhythm. Grossly normal heart sounds.  2+ radial pulses bilaterally. Respiratory: Normal respiratory effort.  No retractions. Lungs CTAB. Gastrointestinal: Soft and nontender. No distention. Musculoskeletal: No lower extremity tenderness nor edema.  Neurologic:  Normal speech and language. No gross focal neurologic deficits are appreciated.    ED Results / Procedures / Treatments   Labs (all labs ordered are listed, but  only abnormal results are displayed) Labs Reviewed  COMPREHENSIVE METABOLIC PANEL - Abnormal; Notable for the following components:      Result Value   Glucose, Bld 105 (*)    All other components within normal limits  VALPROIC ACID LEVEL - Abnormal; Notable for the following components:   Valproic Acid Lvl 49 (*)    All other components within normal limits  RESP PANEL BY RT-PCR (FLU A&B, COVID) ARPGX2  ETHANOL  CBC WITH DIFFERENTIAL/PLATELET  URINE DRUG SCREEN, QUALITATIVE ([facility] ONLY)    PROCEDURES:  Critical Care performed: No  Procedures   MEDICATIONS ORDERED IN ED: Medications  LORazepam (ATIVAN) tablet 1 mg (1 mg Oral Given 04/12/22 1344)     IMPRESSION / MDM / ASSESSMENT AND PLAN / ED COURSE  I reviewed the triage vital signs and the nursing notes.                              26 y.o. male with past medical history of schizoaffective disorder, asthma, and depression who presents to the ED for psychiatric evaluation after found punching the wall in a gas station and acting erratically.  Patient's presentation is most consistent with acute presentation  with potential threat to life or bodily function.  Differential diagnosis includes, but is not limited to, psychosis, anxiety, depression, medication noncompliance, electrolyte abnormality, substance abuse.  Patient nontoxic-appearing and in no acute distress, vital signs are unremarkable.  He is calm and cooperative at the time of my assessment, denies any medical complaints.  Screening labs are unremarkable with no significant anemia, leukocytosis, electrolyte abnormality, or AKI.  Alcohol level is also undetectable, patient may be medically cleared for psychiatric disposition.  The patient has been placed in psychiatric observation due to the need to provide a safe environment for the patient while obtaining psychiatric consultation and evaluation, as well as ongoing medical and medication management to treat the  patient's condition.  The patient has been placed under full IVC at this time.      FINAL CLINICAL IMPRESSION(S) / ED DIAGNOSES   Final diagnoses:  Psychosis, unspecified psychosis type ([place])     Rx / DC Orders   ED Discharge Orders     None        Note:  This document was prepared using Dragon voice recognition software and may include unintentional dictation errors.   Blake Divine, MD 04/12/22 1550

## 2022-04-12 NOTE — Consult Note (Signed)
Client is starring into space, attempted to run out of the ED earlier.  Will continue to try and assess.  [name], PMHNP

## 2022-04-12 NOTE — ED Notes (Signed)
PT is sleeping NT not able to take vitals

## 2022-04-12 NOTE — ED Notes (Signed)
Patient transferred to room 5, He went to the bathroom, stares at you when you talk to him, does not converse much, paranoid and guarded, will continue to monitor.

## 2022-04-12 NOTE — ED Notes (Signed)
Pt denies any SI or HI at this time.  

## 2022-04-12 NOTE — Progress Notes (Signed)
  PHARMACIST - PHYSICIAN ORDER COMMUNICATION  [name] [name] is a 26 y.o. year old male with a history of Schizoaffective disorder on Clozapine PTA (noncompliant). Continuing this medication order as an inpatient requires that monitoring parameters per REMS requirements must be met.   Clozapine REMS Dispense Authorization was obtained, and will dispense inpatient.  RDA code [id number].  Verified Clozapine dose: 200 mg po HS  Last ANC value and date reported on the Clozapine REMS website: 04/12/22: 4600/UL ANC monitoring frequency: once weekly Next ANC reporting is due on (date) 04/19/22.  Dallie Piles 04/12/2022, 7:37 PM

## 2022-04-12 NOTE — ED Notes (Signed)
Pt called 911 reporting he was cold in room. Per Charge Nurse pt has lost phone privileges for the day.

## 2022-04-12 NOTE — ED Notes (Signed)
When asked patient how is he doing, patient said " I'm horny. Can I have sex?" This Probation officer told patient that he can't have sex here and redirect patient to his room. EDP notified. Home medication reordered. Continue to monitor.

## 2022-04-12 NOTE — ED Notes (Signed)
Patient attempted to run down hallway of ER to outside door. Patient was stopped by security staff without incident.

## 2022-04-12 NOTE — ED Notes (Signed)
IVC/pending psych consult/ unable to evaluate

## 2022-04-13 DIAGNOSIS — F250 Schizoaffective disorder, bipolar type: Secondary | ICD-10-CM | POA: Diagnosis not present

## 2022-04-13 LAB — URINE DRUG SCREEN, QUALITATIVE (ARMC ONLY)
Amphetamines, Ur Screen: NOT DETECTED
Barbiturates, Ur Screen: NOT DETECTED
Benzodiazepine, Ur Scrn: NOT DETECTED
Cannabinoid 50 Ng, Ur ~~LOC~~: NOT DETECTED
Cocaine Metabolite,Ur ~~LOC~~: NOT DETECTED
MDMA (Ecstasy)Ur Screen: NOT DETECTED
Methadone Scn, Ur: NOT DETECTED
Opiate, Ur Screen: NOT DETECTED
Phencyclidine (PCP) Ur S: NOT DETECTED
Tricyclic, Ur Screen: POSITIVE — AB

## 2022-04-13 NOTE — BH Assessment (Signed)
Comprehensive Clinical Assessment (CCA) Note  04/13/2022 [place] [id number]  Chief Complaint:  Chief Complaint  Patient presents with   IVC   [name] [name] arrived to the ED by way of law enforcement.  He identified that he was picked up by the police for loitering.  He denied symptoms of anxiety. He denied having auditory or visual; hallucinations. He denied having suicidal or homicidal ideation or intent.  He denied additional stressors.  He denied the use of alcohol or drugs.  He reports that he is his own legal guardian.  TTS spoke with [employer] (Brother). He reports. That [name] [name] has been not been focused, talking to himself a lot, He could not able to follow a conversation.  He is currently on medication and he has been taking his medications.  He has appeared to be anxious lately.  He recently came back home after several months of incarceration, found not guilty, and he ended up in [place], then he came back for one day, and he ended up in [place] the next day. He was found there and taken to a hospital.  He was admitted to the hospital in [place].  He was discharged from the hospital 2 days ago. He was diagnosed with schizophrenia about 4-5 years ago.   Patient is an unreliable reporter  Visit Diagnosis: Schizophrenia   CCA Screening, Triage and Referral (STR)  Patient Reported Information How did you hear about [place]? Legal System  Referral name: No data recorded Referral phone number: No data recorded  Whom do you see for routine medical problems? No data recorded Practice/Facility Name: No data recorded Practice/Facility Phone Number: No data recorded Name of Contact: No data recorded Contact Number: No data recorded Contact Fax Number: No data recorded Prescriber Name: No data recorded Prescriber Address (if known): No data recorded  What Is the Reason for Your Visit/Call Today? loitering and confused at a gas station  How Long Has This Been Causing You  Problems? <Week  What Do You Feel Would Help You the Most Today? Treatment for Depression or other mood problem   Have You Recently Been in Any Inpatient Treatment (Hospital/Detox/Crisis Center/28-Day Program)? No data recorded Name/Location of Program/Hospital:No data recorded How Long Were You There? No data recorded When Were You Discharged? No data recorded  Have You Ever Received Services From [facility] Before? No data recorded Who Do You See at [facility]? No data recorded  Have You Recently Had Any Thoughts About Hurting Yourself? No  Are You Planning to Commit Suicide/Harm Yourself At This time? No   Have you Recently Had Thoughts About Hurting Someone [name]? No  Explanation: No data recorded  Have You Used Any Alcohol or Drugs in the Past 24 Hours? No  How Long Ago Did You Use Drugs or Alcohol? No data recorded What Did You Use and How Much? No data recorded  Do You Currently Have a Therapist/Psychiatrist? Yes  Name of Therapist/Psychiatrist: [facility]   Have You Been Recently Discharged From Any Public relations account executive or Programs? No  Explanation of Discharge From Practice/Program: No data recorded    CCA Screening Triage Referral Assessment Type of Contact: Face-to-Face  Is this Initial or Reassessment? No data recorded Date Telepsych consult ordered in [facility]:  No data recorded Time Telepsych consult ordered in [facility]:  No data recorded  Patient Reported Information Reviewed? No data recorded Patient Left Without Being Seen? No data recorded Reason for Not Completing Assessment: No data recorded  Collateral Involvement:  [name] ACT team Nurse   Does Patient Have a Automotive engineer Guardian? No data recorded Name and Contact of Legal Guardian: No data recorded If Minor and Not Living with Parent(s), Who has Custody? n/a  Is CPS involved or ever been involved? Never  Is APS involved or ever been involved? Never   Patient Determined To Be At Risk  for Harm To Self or Others Based on Review of Patient Reported Information or Presenting Complaint? No  Method: No data recorded Availability of Means: No data recorded Intent: No data recorded Notification Required: No data recorded Additional Information for Danger to Others Potential: No data recorded Additional Comments for Danger to Others Potential: No data recorded Are There Guns or Other Weapons in Your Home? No data recorded Types of Guns/Weapons: No data recorded Are These Weapons Safely Secured?                            No data recorded Who Could Verify You Are Able To Have These Secured: No data recorded Do You Have any Outstanding Charges, Pending Court Dates, Parole/Probation? No data recorded Contacted To Inform of Risk of Harm To Self or Others: Other: Comment   Location of Assessment: [facility]   Does Patient Present under Involuntary Commitment? Yes  IVC Papers Initial File Date: 05/12/22   [place] of Residence: [place]   Patient Currently Receiving the Following Services: ACTT Engineer, agricultural Treatment); Medication Management   Determination of Need: Emergent (2 hours)   Options For Referral: Medication Management; Intensive Outpatient Therapy     CCA Biopsychosocial Intake/Chief Complaint:  No data recorded Current Symptoms/Problems: No data recorded  Patient Reported Schizophrenia/Schizoaffective Diagnosis in Past: Yes   Strengths: Have a support system, stable housing and recieves outpaitnet treatment.  Preferences: No data recorded Abilities: No data recorded  Type of Services Patient Feels are Needed: No data recorded  Initial Clinical Notes/Concerns: No data recorded  Mental Health Symptoms Depression:   Change in energy/activity   Duration of Depressive symptoms:  Less than two weeks   Mania:   Racing thoughts; Overconfidence   Anxiety:    Difficulty concentrating; Sleep   Psychosis:   None   Duration of Psychotic  symptoms:  Greater than six months   Trauma:   N/A   Obsessions:   N/A   Compulsions:   N/A   Inattention:   N/A   Hyperactivity/Impulsivity:   N/A   Oppositional/Defiant Behaviors:   N/A   Emotional Irregularity:   N/A   Other Mood/Personality Symptoms:  No data recorded   Mental Status Exam Appearance and self-care  Stature:   Average   Weight:   Average weight   Clothing:   Neat/clean   Grooming:   Normal   Cosmetic use:   None   Posture/gait:   Normal   Motor activity:   -- (within normal range)   Sensorium  Attention:   Normal   Concentration:   Normal   Orientation:   Time; Place; Person   Recall/memory:   Normal   Affect and Mood  Affect:   Anxious   Mood:   Anxious; Irritable   Relating  Eye contact:   Normal   Facial expression:   Responsive; Anxious   Attitude toward examiner:   Guarded; Irritable   Thought and Language  Speech flow:  Clear and Coherent   Thought content:   Appropriate to Mood and Circumstances  Preoccupation:   None   Hallucinations:   None   Organization:  No data recorded  [employer] of Knowledge:   Fair   Intelligence:   Average   Abstraction:   Normal   Judgement:   Fair   Reality Testing:   Distorted   Insight:   Poor; Fair   Decision Making:   Impulsive   Social Functioning  Social Maturity:   Impulsive; Isolates   Social Judgement:   Heedless   Stress  Stressors:   Other (Comment)   Coping Ability:   Normal   Skill Deficits:   Decision making   Supports:   Family     Religion: Religion/Spirituality Are You A Religious Person?: No  Leisure/Recreation: Leisure / Recreation Do You Have Hobbies?: No  Exercise/Diet: Exercise/Diet Do You Exercise?: No Have You Gained or Lost A Significant Amount of Weight in the Past Six Months?: No Do You Follow a Special Diet?: No Do You Have Any Trouble Sleeping?: No   CCA  Employment/Education Employment/Work Situation: Employment / Work Systems developer: On disability Why is Patient on Disability: Mental Health Patient's Job has Been Impacted by Current Illness: No Has Patient ever Been in the [employer]?: No  Education: Education Last Grade Completed:  (UT[employer]) Did You Attend College?: Yes What Type of College Degree Do you Have?: Freshman Year Did You Have An Individualized Education Program (IIEP): No Did You Have Any Difficulty At School?: No   CCA Family/Childhood History Family and Relationship History: Family history Does patient have children?: No  Childhood History:  Childhood History By whom was/is the patient raised?: Mother Did patient suffer any verbal/emotional/physical/sexual abuse as a child?: No Has patient ever been sexually abused/assaulted/raped as an adolescent or adult?: No Witnessed domestic violence?: No Has patient been affected by domestic violence as an adult?: No  Child/Adolescent Assessment:     CCA Substance Use Alcohol/Drug Use: Alcohol / Drug Use Pain Medications: See PTA Prescriptions: See PTA Over the Counter: See PTA History of alcohol / drug use?: No history of alcohol / drug abuse Longest period of sobriety (when/how long): n/a Negative Consequences of Use: Personal relationships Withdrawal Symptoms: None                         ASAM's:  Six Dimensions of Multidimensional Assessment  Dimension 1:  Acute Intoxication and/or Withdrawal Potential:   Dimension 1:  Description of individual's past and current experiences of substance use and withdrawal: Pt has a hx of cannabis use  Dimension 2:  Biomedical Conditions and Complications:      Dimension 3:  Emotional, Behavioral, or Cognitive Conditions and Complications:  Dimension 3:  Description of emotional, behavioral, or cognitive conditions and complications: Pt has a hx of schizoaffective disorder  Dimension 4:  Readiness to  Change:     Dimension 5:  Relapse, Continued use, or Continued Problem Potential:     Dimension 6:  Recovery/Living Environment:     ASAM Severity Score: ASAM's Severity Rating Score: 4  ASAM Recommended Level of Treatment: ASAM Recommended Level of Treatment:  (n/a)   Substance use Disorder (SUD) Substance Use Disorder (SUD)  Checklist Symptoms of Substance Use:  (n/a)  Recommendations for Services/Supports/Treatments: Recommendations for Services/Supports/Treatments Recommendations For Services/Supports/Treatments:  (n/a)  DSM5 Diagnoses: Patient Active Problem List   Diagnosis Date Noted   Hypertension 03/06/2018   Schizoaffective disorder, bipolar type (HCC) 12/30/2017      @BHCOLLABOFCARE @   Pincus Large, Social worker

## 2022-04-13 NOTE — ED Notes (Signed)
Lunch tray given. 

## 2022-04-13 NOTE — ED Notes (Signed)
IVC/recommends psych inpt admit when medically clear.[name] [name]

## 2022-04-13 NOTE — Consult Note (Signed)
[facility] Face-to-Face Psychiatry Consult   Reason for Consult:  agitated at a gas station Referring Physician:  EDP Patient Identification: [name] [name] MRN:  [id number] Principal Diagnosis: Schizoaffective disorder, bipolar type ([place]) Diagnosis:  Principal Problem:   Schizoaffective disorder, bipolar type ([place])   Total Time spent with patient: 45 minutes  Subjective:   [name] [name] is a 26 y.o. male patient admitted with agitation.  HPI:  26 yo male with schizoaffective disorder found at a gas station punching walls.  He was brought to the ED and given agitation medications along with his Invega injection.  Today, he is calm and cooperative with denials of everything.  His ACT team, Armen Pickup, was notified and they reported he has been traveling out of state and different places with admissions and some jail time.  Recently discharged from inpatient last week and evidently stopped taking his medications and decompensated.  His mother is his guardian, awaiting contact for more information.  Calm and cooperative, will seek inpatient hospitalization.  Past Psychiatric History: schizoaffective d/o  Risk to Self:  none Risk to Others:  none Prior Inpatient Therapy:  yes Prior Outpatient Therapy:  Armen Pickup ACT team  Past Medical History:  Past Medical History:  Diagnosis Date   Asthma    Depression    Psychosis ([place])    Schizoaffective disorder [facility])     Past Surgical History:  Procedure Laterality Date   BACK SURGERY     I&D groin  2017   Family History: No family history on file. Family Psychiatric  History: none Social History:  Social History   Substance and Sexual Activity  Alcohol Use No     Social History   Substance and Sexual Activity  Drug Use Never    Social History   Socioeconomic History   Marital status: Single    Spouse name: Not on file   Number of children: Not on file   Years of education: Not on file   Highest education level: Not on  file  Occupational History   Not on file  Tobacco Use   Smoking status: Never   Smokeless tobacco: Never  Vaping Use   Vaping Use: Some days  Substance and Sexual Activity   Alcohol use: No   Drug use: Never   Sexual activity: Not Currently  Other Topics Concern   Not on file  Social History Narrative   Not on file   Social Determinants of Health   Financial Resource Strain: Not on file  Food Insecurity: Not on file  Transportation Needs: Not on file  Physical Activity: Not on file  Stress: Not on file  Social Connections: Not on file   Additional Social History:    Allergies:  No Known Allergies  Labs:  Results for orders placed or performed during the hospital encounter of 04/12/22 (from the past 48 hour(s))  Valproic acid level     Status: Abnormal   Collection Time: 04/12/22 12:17 PM  Result Value Ref Range   Valproic Acid Lvl 49 (L) 50.0 - 100.0 ug/mL    Comment: Performed at [facility], [address], [place], [place] [address]  Resp Panel by RT-PCR (Flu A&B, Covid) Anterior Nasal Swab     Status: None   Collection Time: 04/12/22 12:19 PM   Specimen: Anterior Nasal Swab  Result Value Ref Range   SARS Coronavirus 2 by RT PCR NEGATIVE NEGATIVE    Comment: (NOTE) SARS-CoV-2 target nucleic acids are NOT DETECTED.  The SARS-CoV-2 RNA is generally detectable in upper respiratory specimens during the acute phase of infection. The lowest concentration of SARS-CoV-2 viral copies this assay can detect is 138 copies/mL. A negative result does not preclude SARS-Cov-2 infection and should not be used as the sole basis for treatment or other patient management decisions. A negative result may occur with  improper specimen collection/handling, submission of specimen other than nasopharyngeal swab, presence of viral mutation(s) within the areas targeted by this assay, and inadequate number of viral copies(<138 copies/mL). A negative result must be combined  with clinical observations, patient history, and epidemiological information. The expected result is Negative.  Fact Sheet for Patients:  EntrepreneurPulse.com.au  Fact Sheet for Healthcare Providers:  IncredibleEmployment.be  This test is no t yet approved or cleared by the [place] FDA and  has been authorized for detection and/or diagnosis of SARS-CoV-2 by FDA under an Emergency Use Authorization (EUA). This EUA will remain  in effect (meaning this test can be used) for the duration of the COVID-19 declaration under Section 564(b)(1) of the Act, 21 U.S.C.section 360bbb-3(b)(1), unless the authorization is terminated  or revoked sooner.       Influenza A by PCR NEGATIVE NEGATIVE   Influenza B by PCR NEGATIVE NEGATIVE    Comment: (NOTE) The Xpert Xpress SARS-CoV-2/FLU/RSV plus assay is intended as an aid in the diagnosis of influenza from Nasopharyngeal swab specimens and should not be used as a sole basis for treatment. Nasal washings and aspirates are unacceptable for Xpert Xpress SARS-CoV-2/FLU/RSV testing.  Fact Sheet for Patients: EntrepreneurPulse.com.au  Fact Sheet for Healthcare Providers: IncredibleEmployment.be  This test is not yet approved or cleared by the [place] FDA and has been authorized for detection and/or diagnosis of SARS-CoV-2 by FDA under an Emergency Use Authorization (EUA). This EUA will remain in effect (meaning this test can be used) for the duration of the COVID-19 declaration under Section 564(b)(1) of the Act, 21 U.S.C. section 360bbb-3(b)(1), unless the authorization is terminated or revoked.  Performed at [facility], [address] [place], [place] [address]   Comprehensive metabolic panel     Status: Abnormal   Collection Time: 04/12/22 12:19 PM  Result Value Ref Range   Sodium 139 135 - 145 mmol/L   Potassium 3.8 3.5 - 5.1 mmol/L    Chloride 108 98 - 111 mmol/L   CO2 23 22 - 32 mmol/L   Glucose, Bld 105 (H) 70 - 99 mg/dL    Comment: Glucose reference range applies only to samples taken after fasting for at least 8 hours.   BUN 20 6 - 20 mg/dL   Creatinine, Ser 0.82 0.61 - 1.24 mg/dL   Calcium 9.1 8.9 - 10.3 mg/dL   Total Protein 7.3 6.5 - 8.1 g/dL   Albumin 4.2 3.5 - 5.0 g/dL   AST 19 15 - 41 U/L   ALT 19 0 - 44 U/L   Alkaline Phosphatase 67 38 - 126 U/L   Total Bilirubin 0.8 0.3 - 1.2 mg/dL   GFR, Estimated >60 >60 mL/min    Comment: (NOTE) Calculated using the CKD-EPI Creatinine Equation (2021)    Anion gap 8 5 - 15    Comment: Performed at [facility], [address] [place],  [id number]  Ethanol     Status: None   Collection Time: 04/12/22 12:19 PM  Result Value Ref Range   Alcohol, Ethyl (B) <10 <10 mg/dL    Comment: (NOTE) Lowest detectable limit for serum  alcohol is 10 mg/dL.  For medical purposes only. Performed at [facility], [address] [place], [place] [address]   CBC with Diff     Status: None   Collection Time: 04/12/22 12:19 PM  Result Value Ref Range   WBC 7.3 4.0 - 10.5 K/uL   RBC 5.16 4.22 - 5.81 MIL/uL   Hemoglobin 14.4 13.0 - 17.0 g/dL   HCT 43.0 39.0 - 52.0 %   MCV 83.3 80.0 - 100.0 fL   MCH 27.9 26.0 - 34.0 pg   MCHC 33.5 30.0 - 36.0 g/dL   RDW 12.8 11.5 - 15.5 %   Platelets 242 150 - 400 K/uL   nRBC 0.0 0.0 - 0.2 %   Neutrophils Relative % 63 %   Neutro Abs 4.6 1.7 - 7.7 K/uL   Lymphocytes Relative 28 %   Lymphs Abs 2.1 0.7 - 4.0 K/uL   Monocytes Relative 9 %   Monocytes Absolute 0.6 0.1 - 1.0 K/uL   Eosinophils Relative 0 %   Eosinophils Absolute 0.0 0.0 - 0.5 K/uL   Basophils Relative 0 %   Basophils Absolute 0.0 0.0 - 0.1 K/uL   Immature Granulocytes 0 %   Abs Immature Granulocytes 0.03 0.00 - 0.07 K/uL    Comment: Performed at [facility], [address] [place], [place] [address]    Current Facility-Administered  Medications  Medication Dose Route Frequency Provider Last Rate Last Admin   amLODipine (NORVASC) tablet 10 mg  10 mg Oral Daily Naaman Plummer, MD   10 mg at 04/13/22 0923   divalproex (DEPAKOTE ER) 24 hr tablet 500 mg  500 mg Oral BID Naaman Plummer, MD   500 mg at 04/13/22 [id number]   paliperidone (INVEGA SUSTENNA) injection 234 mg  234 mg Intramuscular Q28 days Naaman Plummer, MD       Current Outpatient Medications  Medication Sig Dispense Refill   amLODipine (NORVASC) 10 MG tablet Take 1 tablet (10 mg total) by mouth daily. (Patient not taking: Reported on 03/30/2022) 7 tablet 0   clozapine (CLOZARIL) 200 MG tablet Take 1 tablet (200 mg total) by mouth at bedtime. (Patient not taking: Reported on 03/30/2022) 7 tablet 0   divalproex (DEPAKOTE ER) 500 MG 24 hr tablet Take 500 mg by mouth 2 (two) times daily. (Patient not taking: Reported on 03/30/2022)     divalproex (DEPAKOTE) 500 MG DR tablet Take 1 tablet (500 mg total) by mouth every 12 (twelve) hours. (Patient not taking: Reported on 03/30/2022) 14 tablet 0   paliperidone (INVEGA SUSTENNA) 234 MG/1.5ML injection Inject 234 mg into the muscle every 28 (twenty-eight) days. (Patient not taking: Reported on 03/30/2022) 1.5 mL 1    Musculoskeletal: Strength & Muscle Tone: within normal limits Gait & Station: normal Patient leans: N/A  Psychiatric Specialty Exam: Physical Exam Vitals and nursing note reviewed.  Constitutional:      Appearance: Normal appearance.  HENT:     Head: Normocephalic.     Nose: Nose normal.  Pulmonary:     Effort: Pulmonary effort is normal.  Musculoskeletal:        General: Normal range of motion.     Cervical back: Normal range of motion.  Neurological:     General: No focal deficit present.     Mental Status: He is alert and oriented to person, place, and time.  Psychiatric:        Attention and Perception: Attention and perception normal.  Mood and Affect: Affect is blunt.        Speech: Speech  normal.        Behavior: Behavior normal. Behavior is cooperative.        Thought Content: Thought content normal.        Cognition and Memory: Cognition and memory normal.        Judgment: Judgment normal.     Review of Systems  All other systems reviewed and are negative.   Blood pressure 108/75, pulse 78, temperature 97.9 F (36.6 C), resp. rate 16, weight 103.4 kg, SpO2 97 %.Body mass index is 30.08 kg/m.  General Appearance: Casual  Eye Contact:  Fair  Speech:  Normal Rate  Volume:  Normal  Mood:  Euphoric  Affect:  Congruent  Thought Process:  Coherent and Descriptions of Associations: Intact  Orientation:  Full (Time, Place, and Person)  Thought Content:  Delusions  Suicidal Thoughts:  No  Homicidal Thoughts:  No  Memory:  Immediate;   Fair Recent;   Fair Remote;   Fair  Judgement:  Poor  Insight:  Lacking  Psychomotor Activity:  Decreased  Concentration:  Concentration: Fair and Attention Span: Fair  Recall:  [employer] of Knowledge:  Fair  Language:  Good  Akathisia:  No  Handed:  Right  AIMS (if indicated):     Assets:  Leisure Time Physical Health Resilience Social Support  ADL's:  Intact  Cognition:  WNL  Sleep:        Physical Exam: Physical Exam Vitals and nursing note reviewed.  Constitutional:      Appearance: Normal appearance.  HENT:     Head: Normocephalic.     Nose: Nose normal.  Pulmonary:     Effort: Pulmonary effort is normal.  Musculoskeletal:        General: Normal range of motion.     Cervical back: Normal range of motion.  Neurological:     General: No focal deficit present.     Mental Status: He is alert and oriented to person, place, and time.  Psychiatric:        Attention and Perception: Attention and perception normal.        Mood and Affect: Affect is blunt.        Speech: Speech normal.        Behavior: Behavior normal. Behavior is cooperative.        Thought Content: Thought content normal.        Cognition and  Memory: Cognition and memory normal.        Judgment: Judgment normal.    Review of Systems  All other systems reviewed and are negative.  Blood pressure 108/75, pulse 78, temperature 97.9 F (36.6 C), resp. rate 16, weight 103.4 kg, SpO2 97 %. Body mass index is 30.08 kg/m.  Treatment Plan Summary: Daily contact with patient to assess and evaluate symptoms and progress in treatment, Medication management, and Plan : Schizoaffective disorder, bipolar type: Depakote 500 mg BID  Invega injection given yesterday  Disposition: Recommend psychiatric Inpatient admission when medically cleared.  [name], NP 04/13/2022 11:21 AM

## 2022-04-13 NOTE — ED Notes (Signed)
Report to include Situation, Background, Assessment, and Recommendations received from Crystal RN. Patient alert and oriented, warm and dry, in no acute distress. Patient denies SI, HI, AVH and pain. Patient made aware of Q15 minute rounds and security cameras for their safety. Patient instructed to come to me with needs or concerns.  

## 2022-04-13 NOTE — ED Notes (Signed)
Hospital meal provided, pt tolerated w/o complaints.  Waste discarded appropriately.  

## 2022-04-13 NOTE — ED Notes (Signed)
IVC/pending psych consult 

## 2022-04-13 NOTE — ED Provider Notes (Signed)
Emergency Medicine Observation Re-evaluation Note  [name] [name] is a 26 y.o. male, seen on rounds today.  Pt initially presented to the ED for complaints of IVC   Physical Exam  BP 115/71 (BP Location: Right Arm)   Pulse 81   Temp 97.8 F (36.6 C) (Oral)   Resp 18   Wt 103.4 kg   SpO2 96%   BMI 30.08 kg/m  Physical Exam General: no distress Lungs: no increased wob Psych: no agitation  ED Course / MDM  EKG:   I have reviewed the labs performed to date as well as medications administered while in observation.  Recent changes in the last 24 hours include has not been able to be evaluated by psych yet.  Plan  Current plan is for psychiatry consult.  Patient is under full IVC at this time.    Rada Hay, MD 04/13/22 [id number]

## 2022-04-14 MED ORDER — PALIPERIDONE ER 3 MG PO TB24
3.0000 mg | ORAL_TABLET | Freq: Every day | ORAL | Status: DC
  Administered 2022-04-14 – 2022-04-16 (×3): 3 mg via ORAL
  Filled 2022-04-14 (×3): qty 1

## 2022-04-14 MED ORDER — HALOPERIDOL LACTATE 5 MG/ML IJ SOLN: 5.0000 mg | Freq: Once | INTRAMUSCULAR | Status: DC

## 2022-04-14 MED ORDER — LORAZEPAM 2 MG/ML IJ SOLN
2.0000 mg | Freq: Once | INTRAMUSCULAR | Status: AC
  Administered 2022-04-14: 2 mg via INTRAMUSCULAR
  Filled 2022-04-14: qty 1

## 2022-04-14 MED ORDER — LORAZEPAM 2 MG/ML IJ SOLN: 2.0000 mg | Freq: Once | INTRAMUSCULAR | Status: DC

## 2022-04-14 MED ORDER — TRAZODONE HCL 100 MG PO TABS
100.0000 mg | ORAL_TABLET | Freq: Every day | ORAL | Status: DC
  Administered 2022-04-14 – 2022-04-16 (×3): 100 mg via ORAL
  Filled 2022-04-14 (×3): qty 1

## 2022-04-14 MED ORDER — HALOPERIDOL LACTATE 5 MG/ML IJ SOLN
5.0000 mg | Freq: Once | INTRAMUSCULAR | Status: AC
  Administered 2022-04-14: 5 mg via INTRAMUSCULAR
  Filled 2022-04-14: qty 1

## 2022-04-14 MED ORDER — DIAZEPAM 5 MG PO TABS
5.0000 mg | ORAL_TABLET | Freq: Once | ORAL | Status: AC
  Administered 2022-04-14: 5 mg via ORAL
  Filled 2022-04-14: qty 1

## 2022-04-14 MED ORDER — DIPHENHYDRAMINE HCL 50 MG/ML IJ SOLN: 50.0000 mg | Freq: Once | INTRAMUSCULAR | Status: DC

## 2022-04-14 MED ORDER — DIPHENHYDRAMINE HCL 50 MG/ML IJ SOLN
50.0000 mg | Freq: Once | INTRAMUSCULAR | Status: AC
  Administered 2022-04-14: 50 mg via INTRAMUSCULAR
  Filled 2022-04-14: qty 1

## 2022-04-14 MED ORDER — ZIPRASIDONE MESYLATE 20 MG IM SOLR: 20.0000 mg | Freq: Once | INTRAMUSCULAR | Status: DC

## 2022-04-14 NOTE — ED Notes (Addendum)
This RN took report from [place]. RN reported pt was aggressive and had to be held under a manual hold to be medicated. Chart shows patient in active restraints. Pt is not currently in any type of physical restraints. This RN will make MD Isaacs aware that patient is not in restraints, and will DC restraint order.

## 2022-04-14 NOTE — Consult Note (Signed)
[facility] Face-to-Face Psychiatry Consult   Reason for Consult:  agitated at a gas station Referring Physician:  EDP Patient Identification: [name] [name] MRN:  [id number] Principal Diagnosis: Schizoaffective disorder, bipolar type ([place]) Diagnosis:  Principal Problem:   Schizoaffective disorder, bipolar type ([place])   Total Time spent with patient: 45 minutes  Subjective:   [name] [name] is a 26 y.o. male patient admitted with agitation.  He was calm yesterday but became agitated in the evening.  He paced all night and made inappropriate sexual comments.  When he started throwing chairs and banging walls, agitation medications were given.  He is currently sleeping and continues to need inpatient hospitalization to stabilize.  HPI:  26 yo male with schizoaffective disorder found at a gas station punching walls.  He was brought to the ED and given agitation medications along with his Invega injection.  Today, he is calm and cooperative with denials of everything.  His ACT team, Armen Pickup, was notified and they reported he has been traveling out of state and different places with admissions and some jail time.  Recently discharged from inpatient last week and evidently stopped taking his medications and decompensated.  His mother is his guardian, awaiting contact for more information.  Calm and cooperative, will seek inpatient hospitalization.  Past Psychiatric History: schizoaffective d/o  Risk to Self:  none Risk to Others:  none Prior Inpatient Therapy:  yes Prior Outpatient Therapy:  Armen Pickup ACT team  Past Medical History:  Past Medical History:  Diagnosis Date   Asthma    Depression    Psychosis ([place])    Schizoaffective disorder [facility])     Past Surgical History:  Procedure Laterality Date   BACK SURGERY     I&D groin  2017   Family History: No family history on file. Family Psychiatric  History: none Social History:  Social History   Substance and Sexual Activity   Alcohol Use No     Social History   Substance and Sexual Activity  Drug Use Never    Social History   Socioeconomic History   Marital status: Single    Spouse name: Not on file   Number of children: Not on file   Years of education: Not on file   Highest education level: Not on file  Occupational History   Not on file  Tobacco Use   Smoking status: Never   Smokeless tobacco: Never  Vaping Use   Vaping Use: Some days  Substance and Sexual Activity   Alcohol use: No   Drug use: Never   Sexual activity: Not Currently  Other Topics Concern   Not on file  Social History Narrative   Not on file   Social Determinants of Health   Financial Resource Strain: Not on file  Food Insecurity: Not on file  Transportation Needs: Not on file  Physical Activity: Not on file  Stress: Not on file  Social Connections: Not on file   Additional Social History:    Allergies:  No Known Allergies  Labs:  Results for orders placed or performed during the hospital encounter of 04/12/22 (from the past 48 hour(s))  Valproic acid level     Status: Abnormal   Collection Time: 04/12/22 12:17 PM  Result Value Ref Range   Valproic Acid Lvl 49 (L) 50.0 - 100.0 ug/mL    Comment: Performed at [facility], [address] [place], [place] [address]  Resp Panel by RT-PCR (Flu A&B, Covid) Anterior Nasal Swab  Status: None   Collection Time: 04/12/22 12:19 PM   Specimen: Anterior Nasal Swab  Result Value Ref Range   SARS Coronavirus 2 by RT PCR NEGATIVE NEGATIVE    Comment: (NOTE) SARS-CoV-2 target nucleic acids are NOT DETECTED.  The SARS-CoV-2 RNA is generally detectable in upper respiratory specimens during the acute phase of infection. The lowest concentration of SARS-CoV-2 viral copies this assay can detect is 138 copies/mL. A negative result does not preclude SARS-Cov-2 infection and should not be used as the sole basis for treatment or other patient management decisions.  A negative result may occur with  improper specimen collection/handling, submission of specimen other than nasopharyngeal swab, presence of viral mutation(s) within the areas targeted by this assay, and inadequate number of viral copies(<138 copies/mL). A negative result must be combined with clinical observations, patient history, and epidemiological information. The expected result is Negative.  Fact Sheet for Patients:  EntrepreneurPulse.com.au  Fact Sheet for Healthcare Providers:  IncredibleEmployment.be  This test is no t yet approved or cleared by the [place] FDA and  has been authorized for detection and/or diagnosis of SARS-CoV-2 by FDA under an Emergency Use Authorization (EUA). This EUA will remain  in effect (meaning this test can be used) for the duration of the COVID-19 declaration under Section 564(b)(1) of the Act, 21 U.S.C.section 360bbb-3(b)(1), unless the authorization is terminated  or revoked sooner.       Influenza A by PCR NEGATIVE NEGATIVE   Influenza B by PCR NEGATIVE NEGATIVE    Comment: (NOTE) The Xpert Xpress SARS-CoV-2/FLU/RSV plus assay is intended as an aid in the diagnosis of influenza from Nasopharyngeal swab specimens and should not be used as a sole basis for treatment. Nasal washings and aspirates are unacceptable for Xpert Xpress SARS-CoV-2/FLU/RSV testing.  Fact Sheet for Patients: EntrepreneurPulse.com.au  Fact Sheet for Healthcare Providers: IncredibleEmployment.be  This test is not yet approved or cleared by the [place] FDA and has been authorized for detection and/or diagnosis of SARS-CoV-2 by FDA under an Emergency Use Authorization (EUA). This EUA will remain in effect (meaning this test can be used) for the duration of the COVID-19 declaration under Section 564(b)(1) of the Act, 21 U.S.C. section 360bbb-3(b)(1), unless the authorization is  terminated or revoked.  Performed at [facility], [address] [place], [place] [address]   Comprehensive metabolic panel     Status: Abnormal   Collection Time: 04/12/22 12:19 PM  Result Value Ref Range   Sodium 139 135 - 145 mmol/L   Potassium 3.8 3.5 - 5.1 mmol/L   Chloride 108 98 - 111 mmol/L   CO2 23 22 - 32 mmol/L   Glucose, Bld 105 (H) 70 - 99 mg/dL    Comment: Glucose reference range applies only to samples taken after fasting for at least 8 hours.   BUN 20 6 - 20 mg/dL   Creatinine, Ser 0.82 0.61 - 1.24 mg/dL   Calcium 9.1 8.9 - 10.3 mg/dL   Total Protein 7.3 6.5 - 8.1 g/dL   Albumin 4.2 3.5 - 5.0 g/dL   AST 19 15 - 41 U/L   ALT 19 0 - 44 U/L   Alkaline Phosphatase 67 38 - 126 U/L   Total Bilirubin 0.8 0.3 - 1.2 mg/dL   GFR, Estimated >60 >60 mL/min    Comment: (NOTE) Calculated using the CKD-EPI Creatinine Equation (2021)    Anion gap 8 5 - 15    Comment: Performed at [facility], [place]  [address], [place], [place] [address]  Ethanol     Status: None   Collection Time: 04/12/22 12:19 PM  Result Value Ref Range   Alcohol, Ethyl (B) <10 <10 mg/dL    Comment: (NOTE) Lowest detectable limit for serum alcohol is 10 mg/dL.  For medical purposes only. Performed at [facility] [address] [place], [place] [address]   CBC with Diff     Status: None   Collection Time: 04/12/22 12:19 PM  Result Value Ref Range   WBC 7.3 4.0 - 10.5 K/uL   RBC 5.16 4.22 - 5.81 MIL/uL   Hemoglobin 14.4 13.0 - 17.0 g/dL   HCT 12.8 78.6 - 76.7 %   MCV 83.3 80.0 - 100.0 fL   MCH 27.9 26.0 - 34.0 pg   MCHC 33.5 30.0 - 36.0 g/dL   RDW 20.9 47.0 - 96.2 %   Platelets 242 150 - 400 K/uL   nRBC 0.0 0.0 - 0.2 %   Neutrophils Relative % 63 %   Neutro Abs 4.6 1.7 - 7.7 K/uL   Lymphocytes Relative 28 %   Lymphs Abs 2.1 0.7 - 4.0 K/uL   Monocytes Relative 9 %   Monocytes Absolute 0.6 0.1 - 1.0 K/uL   Eosinophils Relative 0 %   Eosinophils Absolute 0.0  0.0 - 0.5 K/uL   Basophils Relative 0 %   Basophils Absolute 0.0 0.0 - 0.1 K/uL   Immature Granulocytes 0 %   Abs Immature Granulocytes 0.03 0.00 - 0.07 K/uL    Comment: Performed at [facility], [address] [place], [place] [address]  Urine Drug Screen, Qualitative     Status: Abnormal   Collection Time: 04/12/22  4:20 PM  Result Value Ref Range   Tricyclic, Ur Screen POSITIVE (A) NONE DETECTED   Amphetamines, Ur Screen NONE DETECTED NONE DETECTED   MDMA (Ecstasy)Ur Screen NONE DETECTED NONE DETECTED   Cocaine Metabolite,Ur [place]ros NONE DETECTED NONE DETECTED   Opiate, Ur Screen NONE DETECTED NONE DETECTED   Phencyclidine (PCP) Ur S NONE DETECTED NONE DETECTED   Cannabinoid 50 Ng, Ur [place] NONE DETECTED NONE DETECTED   Barbiturates, Ur Screen NONE DETECTED NONE DETECTED   Benzodiazepine, Ur Scrn NONE DETECTED NONE DETECTED   Methadone Scn, Ur NONE DETECTED NONE DETECTED    Comment: (NOTE) Tricyclics + metabolites, urine    Cutoff 1000 ng/mL Amphetamines + metabolites, urine  Cutoff 1000 ng/mL MDMA (Ecstasy), urine              Cutoff 500 ng/mL Cocaine Metabolite, urine          Cutoff 300 ng/mL Opiate + metabolites, urine        Cutoff 300 ng/mL Phencyclidine (PCP), urine         Cutoff 25 ng/mL Cannabinoid, urine                 Cutoff 50 ng/mL Barbiturates + metabolites, urine  Cutoff 200 ng/mL Benzodiazepine, urine              Cutoff 200 ng/mL Methadone, urine                   Cutoff 300 ng/mL  The urine drug screen provides only a preliminary, unconfirmed analytical test result and should not be used for non-medical purposes. Clinical consideration and professional judgment should be applied to any positive drug screen result due to possible interfering substances. A more specific alternate chemical method must be used in order to  obtain a confirmed analytical result. Gas chromatography / mass spectrometry (GC/MS) is the preferred confirm atory method. Performed  at [facility], [address] [place], [place] [address]     Current Facility-Administered Medications  Medication Dose Route Frequency Provider Last Rate Last Admin   amLODipine (NORVASC) tablet 10 mg  10 mg Oral Daily Naaman Plummer, MD   10 mg at 04/13/22 0539   divalproex (DEPAKOTE ER) 24 hr tablet 500 mg  500 mg Oral BID Naaman Plummer, MD   500 mg at 04/13/22 2120   paliperidone (INVEGA SUSTENNA) injection 234 mg  234 mg Intramuscular Q28 days Naaman Plummer, MD       Current Outpatient Medications  Medication Sig Dispense Refill   amLODipine (NORVASC) 10 MG tablet Take 1 tablet (10 mg total) by mouth daily. (Patient not taking: Reported on 03/30/2022) 7 tablet 0   clozapine (CLOZARIL) 200 MG tablet Take 1 tablet (200 mg total) by mouth at bedtime. (Patient not taking: Reported on 03/30/2022) 7 tablet 0   divalproex (DEPAKOTE ER) 500 MG 24 hr tablet Take 500 mg by mouth 2 (two) times daily. (Patient not taking: Reported on 03/30/2022)     divalproex (DEPAKOTE) 500 MG DR tablet Take 1 tablet (500 mg total) by mouth every 12 (twelve) hours. (Patient not taking: Reported on 03/30/2022) 14 tablet 0   paliperidone (INVEGA SUSTENNA) 234 MG/1.5ML injection Inject 234 mg into the muscle every 28 (twenty-eight) days. (Patient not taking: Reported on 03/30/2022) 1.5 mL 1    Musculoskeletal: Strength & Muscle Tone: within normal limits Gait & Station: normal Patient leans: N/A  Psychiatric Specialty Exam: Physical Exam Vitals and nursing note reviewed.  Constitutional:      Appearance: Normal appearance.  HENT:     Head: Normocephalic.     Nose: Nose normal.  Pulmonary:     Effort: Pulmonary effort is normal.  Musculoskeletal:        General: Normal range of motion.     Cervical back: Normal range of motion.  Neurological:     General: No focal deficit present.     Mental Status: He is alert and oriented to person, place, and time.  Psychiatric:        Attention and  Perception: Attention normal. He perceives auditory and visual hallucinations.        Mood and Affect: Affect is blunt.        Speech: Speech normal.        Behavior: Behavior is agitated. Behavior is cooperative.        Thought Content: Thought content normal.        Cognition and Memory: Cognition and memory normal.        Judgment: Judgment is inappropriate.     Review of Systems  Psychiatric/Behavioral:  Positive for hallucinations. The patient is nervous/anxious.   All other systems reviewed and are negative.   Blood pressure (!) 129/90, pulse 100, temperature 98 F (36.7 C), temperature source Oral, resp. rate 20, weight 103.4 kg, SpO2 98 %.Body mass index is 30.08 kg/m.  General Appearance: Casual  Eye Contact:  Fair  Speech:  Normal Rate  Volume:  Normal  Mood:  Agitated  Affect:  Congruent  Thought Process:  Coherent and Descriptions of Associations: Intact  Orientation:  Full (Time, Place, and Person)  Thought Content:  hallucinations  Suicidal Thoughts:  No  Homicidal Thoughts:  No  Memory:  Immediate;   Fair Recent;   Fair Remote;  Fair  Judgement:  Poor  Insight:  Lacking  Psychomotor Activity:  Increased  Concentration:  Concentration: Fair and Attention Span: Fair  Recall:  [employer] of Knowledge:  Fair  Language:  Good  Akathisia:  No  Handed:  Right  AIMS (if indicated):     Assets:  Leisure Time Physical Health Resilience Social Support  ADL's:  Intact  Cognition:  WNL  Sleep:        Physical Exam: Physical Exam Vitals and nursing note reviewed.  Constitutional:      Appearance: Normal appearance.  HENT:     Head: Normocephalic.     Nose: Nose normal.  Pulmonary:     Effort: Pulmonary effort is normal.  Musculoskeletal:        General: Normal range of motion.     Cervical back: Normal range of motion.  Neurological:     General: No focal deficit present.     Mental Status: He is alert and oriented to person, place, and time.   Psychiatric:        Attention and Perception: Attention normal. He perceives auditory and visual hallucinations.        Mood and Affect: Affect is blunt.        Speech: Speech normal.        Behavior: Behavior is agitated. Behavior is cooperative.        Thought Content: Thought content normal.        Cognition and Memory: Cognition and memory normal.        Judgment: Judgment is inappropriate.    Review of Systems  Psychiatric/Behavioral:  Positive for hallucinations. The patient is nervous/anxious.   All other systems reviewed and are negative.  Blood pressure (!) 129/90, pulse 100, temperature 98 F (36.7 C), temperature source Oral, resp. rate 20, weight 103.4 kg, SpO2 98 %. Body mass index is 30.08 kg/m.  Treatment Plan Summary: Daily contact with patient to assess and evaluate symptoms and progress in treatment, Medication management, and Plan : Schizoaffective disorder, bipolar type: Depakote 500 mg BID  Invega injection given yesterday Invega 3 mg daily started  Insomnia Trazodone 50 mg daily at bedtime  Disposition: Recommend psychiatric Inpatient admission when medically cleared.  [name], NP 04/14/2022 9:49 AM

## 2022-04-14 NOTE — ED Notes (Signed)
Mother called to check on son.

## 2022-04-14 NOTE — ED Notes (Signed)
Patient continues to pace back and forth from his room to the day room. Will continue to monitor.

## 2022-04-14 NOTE — ED Notes (Signed)
Pt is now resting comfortably right lateral side with his pillow and blanket on chairs in [place].

## 2022-04-14 NOTE — ED Notes (Signed)
Patient came out of his room and told this writer his ear is hurting. This Probation officer told patient to meet her in his room. This Probation officer assess patient ear and noticed a small cut, the area cleaned. Patient was calm and cooperative at this time.

## 2022-04-14 NOTE — ED Notes (Signed)
Pt is picking up chairs and slamming them down. Pt made several statements about fighting security. MD messaged and made aware. RN requested PRN meds for agitations.

## 2022-04-14 NOTE — ED Provider Notes (Addendum)
-----------------------------------------   5:08 AM on 04/14/2022 ----------------------------------------- Has become acutely agitated, banging on the walls threatening staff members.  We will dose IM sedation for patient and staff protection/safety.   Patient did require a brief manual hold for medication administration.   Harvest Dark, MD 04/14/22 [phone]

## 2022-04-14 NOTE — ED Notes (Signed)
Patient came out from his room asking for paper and pen. This Probation officer gave patient the paper and pen. Patient staying by the bathroom door when staff asked him if he wants to use the bathroom, he said "no I'm playing board game with another patient". Staff told patient to please go back to his room. Other patients are still in bed. Patient replied "Bitch I am grown man don't tell me what to do." When security told him to go back to his room, he said "You don't tell me what to do. I will stab you".  Patient then proceed to bang the door and the widow of the nursing station. Staff and security unable to redirect the patient. EDP notified. Patient proceed to pick up the chair in the day room and hit the unit door. Security intervened and control the situation by guiding the patient to the floor and this writer administered medication per protocol. Patient then escorted to his room. VS taken and with normal units.

## 2022-04-14 NOTE — ED Notes (Addendum)
Patient came to the door and said he is bored and wants staff to come to his room. Patient is redirected to his room. He then came back and screamed " I am not letting her win". Patient walked back to his room.

## 2022-04-14 NOTE — ED Notes (Signed)
Pt taking shower. Pt was given hygiene items and the following, 1 clean top, 1 clean bottom, with 1 pair of disposable underwear.  Pt changed out into clean clothing.  Staff disposed of all shower supplies.   

## 2022-04-14 NOTE — ED Notes (Signed)
Pt is pacing the dayroom. He appear to be singing and/or dancing.

## 2022-04-14 NOTE — ED Notes (Signed)
IVC/pending inpatient psych admit  

## 2022-04-14 NOTE — ED Notes (Signed)
Snack and drink given 

## 2022-04-15 DIAGNOSIS — F250 Schizoaffective disorder, bipolar type: Secondary | ICD-10-CM

## 2022-04-15 MED ORDER — DIAZEPAM 5 MG PO TABS
5.0000 mg | ORAL_TABLET | Freq: Once | ORAL | Status: AC
  Administered 2022-04-15: 5 mg via ORAL
  Filled 2022-04-15: qty 1

## 2022-04-15 NOTE — ED Notes (Signed)
Pt given snack. 

## 2022-04-15 NOTE — Consult Note (Signed)
[facility] Psych ED Progress Note  04/15/2022 4:08 PM [name] [name]  MRN:  [id number]   Method of visit?: Face to Face   Subjective:   Principal Problem: Schizoaffective disorder, bipolar type ([place]) Diagnosis:  Principal Problem:   Schizoaffective disorder, bipolar type ([place])  Total Time spent with patient: 15 minutes  Past Psychiatric History:   Past Medical History:  Past Medical History:  Diagnosis Date   Asthma    Depression    Psychosis ([place])    Schizoaffective disorder ([place])     Past Surgical History:  Procedure Laterality Date   BACK SURGERY     I&D groin  2017   Family History: No family history on file. Family Psychiatric  History:  Social History:  Social History   Substance and Sexual Activity  Alcohol Use No     Social History   Substance and Sexual Activity  Drug Use Never    Social History   Socioeconomic History   Marital status: Single    Spouse name: Not on file   Number of children: Not on file   Years of education: Not on file   Highest education level: Not on file  Occupational History   Not on file  Tobacco Use   Smoking status: Never   Smokeless tobacco: Never  Vaping Use   Vaping Use: Some days  Substance and Sexual Activity   Alcohol use: No   Drug use: Never   Sexual activity: Not Currently  Other Topics Concern   Not on file  Social History Narrative   Not on file   Social Determinants of Health   Financial Resource Strain: Not on file  Food Insecurity: Not on file  Transportation Needs: Not on file  Physical Activity: Not on file  Stress: Not on file  Social Connections: Not on file    Sleep: Good  Appetite:  Good  Current Medications: Current Facility-Administered Medications  Medication Dose Route Frequency Provider Last Rate Last Admin   amLODipine (NORVASC) tablet 10 mg  10 mg Oral Daily Naaman Plummer, MD   10 mg at 04/15/22 0925   divalproex (DEPAKOTE ER) 24 hr tablet 500 mg  500 mg Oral BID Naaman Plummer, MD   500 mg at 04/15/22 0925   paliperidone (INVEGA SUSTENNA) injection 234 mg  234 mg Intramuscular Q28 days Naaman Plummer, MD       paliperidone (INVEGA) 24 hr tablet 3 mg  3 mg Oral Daily Patrecia Pour, NP   3 mg at 04/15/22 7654   traZODone (DESYREL) tablet 100 mg  100 mg Oral QHS Patrecia Pour, NP   100 mg at 04/14/22 2127   Current Outpatient Medications  Medication Sig Dispense Refill   amLODipine (NORVASC) 10 MG tablet Take 1 tablet (10 mg total) by mouth daily. (Patient not taking: Reported on 03/30/2022) 7 tablet 0   clozapine (CLOZARIL) 200 MG tablet Take 1 tablet (200 mg total) by mouth at bedtime. (Patient not taking: Reported on 03/30/2022) 7 tablet 0   divalproex (DEPAKOTE ER) 500 MG 24 hr tablet Take 500 mg by mouth 2 (two) times daily. (Patient not taking: Reported on 03/30/2022)     divalproex (DEPAKOTE) 500 MG DR tablet Take 1 tablet (500 mg total) by mouth every 12 (twelve) hours. (Patient not taking: Reported on 03/30/2022) 14 tablet 0   paliperidone (INVEGA SUSTENNA) 234 MG/1.5ML injection Inject 234 mg into the muscle every 28 (twenty-eight) days. (Patient not taking: Reported on  03/30/2022) 1.5 mL 1    Lab Results: No results found for this or any previous visit (from the past 48 hour(s)).  Blood Alcohol level:  Lab Results  Component Value Date   ETH <10 04/12/2022   ETH <10 03/30/2022    Physical Findings: AIMS:  , ,  ,  ,    CIWA:    COWS:     Musculoskeletal: Strength & Muscle Tone: within normal limits Gait & Station: normal Patient leans: N/A  Psychiatric Specialty Exam:  Presentation  General Appearance: Casual; Well Groomed  Eye Contact:Good  Speech:Slow  Speech Volume:Decreased  Handedness:Right   Mood and Affect  Mood:Anxious  Affect:Inappropriate; Constricted   Thought Process  Thought Processes:Disorganized  Descriptions of Associations:Circumstantial  Orientation:Full (Time, Place and Person)  Thought  Content:Paranoid Ideation; Scattered; Illogical  History of Schizophrenia/Schizoaffective disorder:Yes  Duration of Psychotic Symptoms:Greater than six months  Hallucinations:No data recorded Ideas of Reference:Paranoia; Delusions  Suicidal Thoughts:No data recorded Homicidal Thoughts:No data recorded  Sensorium  Memory:Immediate Poor; Recent Poor; Remote Poor  Judgment:Poor  Insight:Poor   Executive Functions  Concentration:Fair  Attention Span:Poor  Recall:Poor  Fund of Knowledge:Poor  Language:Poor   Psychomotor Activity  Psychomotor Activity:No data recorded  Assets  Assets:Communication Skills; Desire for Improvement; Resilience; Social Support   Sleep  Sleep:No data recorded   Physical Exam: Physical Exam Vitals and nursing note reviewed.  HENT:     Head: Normocephalic.     Nose: No congestion or rhinorrhea.  Eyes:     General:        Right eye: No discharge.        Left eye: No discharge.  Cardiovascular:     Rate and Rhythm: Normal rate.  Pulmonary:     Effort: Pulmonary effort is normal.  Musculoskeletal:        General: Normal range of motion.  Skin:    General: Skin is dry.  Neurological:     Mental Status: He is oriented to person, place, and time.    ROS Blood pressure 124/83, pulse 88, temperature 97.8 F (36.6 C), temperature source Oral, resp. rate 17, weight 103.4 kg, SpO2 99 %. Body mass index is 30.08 kg/m.  Treatment Plan Summary: Daily contact with patient to assess and evaluate symptoms and progress in treatment, Medication management, and Plan Patient continues to need inpatient psychiatric hospitalization.   Sherlon Handing, NP 04/15/2022, 4:08 PM

## 2022-04-15 NOTE — BH Assessment (Signed)
[facility] BMU-There are no adult beds available this evening.  Per Levander Campion of [facility] [phone]), There are no adult beds available this evening.

## 2022-04-15 NOTE — ED Provider Notes (Signed)
Emergency Medicine Observation Re-evaluation Note  [name] [name] is a 26 y.o. male, seen on rounds today.   Physical Exam  BP 126/71 (BP Location: Right Arm)   Pulse 91   Temp 98 F (36.7 C) (Oral)   Resp 17   Wt 103.4 kg   SpO2 99%   BMI 30.08 kg/m  Physical Exam General: Patient is pacing in the common area.  Yesterday in the early morning t he became violent and had to be restrained apparently.Wilburn Mylar he got or late last night I should say he got some p.o. Valium which seem to keep him calm.  Since he is pacing again we will try some more p.o. Valium.  Perhaps we can head off another violent episode Lungs: Patient in no respiratory distress Psych: Patient not currently combative please see general above  ED Course / MDM  EKG:     Plan  Current plan is for psychiatry evaluation he still under IVC.Nena Polio, MD 04/15/22 [phone]

## 2022-04-15 NOTE — BH Assessment (Signed)
Adult MH  Referral information for Psychiatric Hospitalization faxed to:   [facility] ([phone]-or- [phone]),   [facility] ([phone]),   [facility] ([phone] -or- [phone]),   [facility] ([phone]---[phone]---[phone]),   [facility] ([phone] or [phone])   [facility] ([phone] or [phone]),   [facility] ([phone]) 

## 2022-04-15 NOTE — ED Notes (Signed)
IVC inpatient psych admit

## 2022-04-15 NOTE — ED Notes (Signed)
Pt given dinner tray and drink at this time. 

## 2022-04-16 ENCOUNTER — Inpatient Hospital Stay
Admission: AD | Admit: 2022-04-16 | Discharge: 2022-04-22 | DRG: 885 | Disposition: A | Discharge status: Home / Self Care | Payer: No Typology Code available for payment source | Source: Intra-hospital | Attending: Psychiatry | Admitting: Psychiatry

## 2022-04-16 ENCOUNTER — Inpatient Hospital Stay: Admission: AD | Admit: 2022-04-16 | Payer: Medicaid Other | Source: Intra-hospital | Admitting: Psychiatry

## 2022-04-16 DIAGNOSIS — Z20822 Contact with and (suspected) exposure to covid-19: Secondary | ICD-10-CM | POA: Diagnosis present

## 2022-04-16 DIAGNOSIS — F250 Schizoaffective disorder, bipolar type: Principal | ICD-10-CM | POA: Diagnosis present

## 2022-04-16 DIAGNOSIS — I10 Essential (primary) hypertension: Secondary | ICD-10-CM | POA: Diagnosis present

## 2022-04-16 LAB — SARS CORONAVIRUS 2 BY RT PCR: SARS Coronavirus 2 by RT PCR: NEGATIVE

## 2022-04-16 MED ORDER — PALIPERIDONE ER 3 MG PO TB24
6.0000 mg | ORAL_TABLET | Freq: Every day | ORAL | Status: DC
  Administered 2022-04-17 – 2022-04-22 (×6): 6 mg via ORAL
  Filled 2022-04-16 (×6): qty 2

## 2022-04-16 MED ORDER — TRAZODONE HCL 100 MG PO TABS
100.0000 mg | ORAL_TABLET | Freq: Every day | ORAL | Status: DC
  Administered 2022-04-17 – 2022-04-21 (×5): 100 mg via ORAL
  Filled 2022-04-16 (×5): qty 1

## 2022-04-16 MED ORDER — PALIPERIDONE PALMITATE ER 234 MG/1.5ML IM SUSY: 234.0000 mg | PREFILLED_SYRINGE | INTRAMUSCULAR | Status: DC

## 2022-04-16 MED ORDER — PALIPERIDONE ER 3 MG PO TB24: 6.0000 mg | ORAL_TABLET | Freq: Every day | ORAL | Status: DC

## 2022-04-16 MED ORDER — MAGNESIUM HYDROXIDE 400 MG/5ML PO SUSP: 30.0000 mL | Freq: Every day | ORAL | Status: DC | PRN

## 2022-04-16 MED ORDER — LORAZEPAM 2 MG PO TABS
2.0000 mg | ORAL_TABLET | Freq: Once | ORAL | Status: AC
  Administered 2022-04-16: 2 mg via ORAL
  Filled 2022-04-16: qty 1

## 2022-04-16 MED ORDER — DIVALPROEX SODIUM ER 500 MG PO TB24
500.0000 mg | ORAL_TABLET | Freq: Two times a day (BID) | ORAL | Status: DC
  Administered 2022-04-17 – 2022-04-22 (×11): 500 mg via ORAL
  Filled 2022-04-16 (×11): qty 1

## 2022-04-16 MED ORDER — AMLODIPINE BESYLATE 5 MG PO TABS
10.0000 mg | ORAL_TABLET | Freq: Every day | ORAL | Status: DC
  Administered 2022-04-17 – 2022-04-22 (×6): 10 mg via ORAL
  Filled 2022-04-16 (×6): qty 2

## 2022-04-16 MED ORDER — ACETAMINOPHEN 325 MG PO TABS: 650.0000 mg | ORAL_TABLET | Freq: Four times a day (QID) | ORAL | Status: DC | PRN

## 2022-04-16 MED ORDER — ALUM & MAG HYDROXIDE-SIMETH 200-200-20 MG/5ML PO SUSP: 30.0000 mL | ORAL | Status: DC | PRN

## 2022-04-16 NOTE — Consult Note (Signed)
Follow-up 26 year old man with schizophrenia or schizoaffective disorder.  Patient came in very agitated.  Has calm down a little bit but remains psychotic.  Continues to meet criteria for inpatient hospitalization.  Vitals stable.  Labs reviewed.  Increase dose of oral Invega to 6 mg.  Continue to recommend inpatient psychiatric hospitalization

## 2022-04-16 NOTE — ED Notes (Signed)
IVC/Pending Placement 

## 2022-04-16 NOTE — ED Notes (Signed)
Pt provided with dinner tray and beverage

## 2022-04-16 NOTE — ED Notes (Signed)
Patient redirected to staying room that day room is closed until morning.  Patient verbally getting upset and questioning RN why is there rules.

## 2022-04-16 NOTE — ED Notes (Signed)
IVC/needs inpatient psychiatric hostipalization
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
## 2022-04-16 NOTE — ED Notes (Signed)
Patient currently in restroom.  

## 2022-04-16 NOTE — ED Provider Notes (Signed)
Emergency Medicine Observation Re-evaluation Note  [name] [name] is a 26 y.o. male, seen on rounds today.  Pt initially presented to the ED for complaints of IVC Currently, the patient is ***.  Physical Exam  BP (!) 132/92 (BP Location: Left Arm)   Pulse 88   Temp 97.7 F (36.5 C) (Oral)   Resp 18   Wt 103.4 kg   SpO2 99%   BMI 30.08 kg/m  Physical Exam General: *** Cardiac: *** Lungs: *** Psych: ***  ED Course / MDM  EKG:   I have reviewed the labs performed to date as well as medications administered while in observation.  Recent changes in the last 24 hours include ***.  Plan  Current plan is for ***.

## 2022-04-16 NOTE — ED Notes (Signed)
Pt given breakfast tray and drink at this time. 

## 2022-04-16 NOTE — ED Notes (Signed)
Pt incontinent of stool while walking in to restroom with security. Pt given clean clothing and shower supplies to get cleaned up after emerging from restroom.

## 2022-04-17 ENCOUNTER — Other Ambulatory Visit: Payer: Self-pay

## 2022-04-17 DIAGNOSIS — F250 Schizoaffective disorder, bipolar type: Secondary | ICD-10-CM

## 2022-04-17 MED ORDER — CLOZAPINE 25 MG PO TABS
50.0000 mg | ORAL_TABLET | Freq: Every day | ORAL | Status: DC
  Administered 2022-04-17 – 2022-04-18 (×2): 50 mg via ORAL
  Filled 2022-04-17 (×2): qty 2

## 2022-04-17 NOTE — Consult Note (Signed)
PHARMACIST - PHYSICIAN ORDER COMMUNICATION  [name] [name] is [name] 26 y.o. year old male with [name] history of schizoaffective disorder, and bipolar on Clozapine PTA. Continuing this medication order as an inpatient requires that monitoring parameters per REMS requirements must be met.   Clozapine REMS Dispense Authorization was obtained, and will dispense inpatient.  RDA code [id number].  Verified Clozapine dose: Clozapine 60m PO HS  Last ANC value and date reported on the Clozapine REMS website: 04/12/22: 4600/uL ANC monitoring frequency: weekly Next ANC reporting is due on (date) 04/19/22.  [name] [name] [name] [name] 04/17/2022, 11:26 AM
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
## 2022-04-17 NOTE — Progress Notes (Signed)
Pt denies SI/HI/AVH and verbally agrees to approach staff if these become apparent or before harming themselves/others. Rates depression 0/10. Rates anxiety 0/10. Rates pain 0/10. Pt sat down next to a male pt and stated "will you sleep with me?" MHT heard and redirected pt that he cannot make comments like that. Pt has been animated and had a fixed smile around females in particular. Pt has not made anymore comments. Pt has been out of his room for most of the day. Pt stated to writer this morning that he wanted to run away with someone and go cross country. He then started to talk about a cartoon and a character he liked.Scheduled medications administered to pt, per MD orders. RN provided support and encouragement to pt. Q15 min safety checks implemented and continued. Pt safe on the unit. RN will continue to monitor and intervene as needed.  Problem: Education: Goal: Will be free of psychotic symptoms Outcome: Not Progressing Goal: Knowledge of the prescribed therapeutic regimen will improve Outcome: Not Progressing   04/17/22 0816  Psych Admission Type (Psych Patients Only)  Admission Status Involuntary  Psychosocial Assessment  Patient Complaints None  Eye Contact Fair  Facial Expression Animated;Fixed smile  Affect Appropriate to circumstance  Speech Tangential  Interaction Sexually inappropriate  Motor Activity Tremors  Appearance/Hygiene In scrubs  Behavior Characteristics Cooperative;Appropriate to situation;Calm  Mood Pleasant  Thought Process  Coherency Tangential  Content WDL  Delusions None reported or observed  Perception WDL  Hallucination None reported or observed  Judgment Poor  Confusion None  Danger to Self  Current suicidal ideation? Denies  Danger to Others  Danger to Others None reported or observed

## 2022-04-17 NOTE — Progress Notes (Signed)
Patient presents with a bizarre affect, doing karate down the hallway. Pt denies SI/HI/AVH but presents as responding to internal stimuli. Pt observed interacting appropriately with staff and peers on the unit. Pt compliant with medication administration per MD orders. Pt given education, support, and encouragement to be active in his treatment plan. Pt being monitored Q 15 minutes for safety per unit protocol, remains safe on the unit
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
## 2022-04-17 NOTE — Group Note (Signed)
[facility] LCSW Group Therapy Note   Group Date: 04/17/2022 Start Time: 1300 End Time: 1400   Type of Therapy and Topic: Group Therapy: Avoiding Self-Sabotaging and Enabling Behaviors  Participation Level: Did Not Attend  Mood:  Description of Group:  In this group, patients will learn how to identify obstacles, self-sabotaging and enabling behaviors, as well as: what are they, why do we do them and what needs these behaviors meet. Discuss unhealthy relationships and how to have positive healthy boundaries with those that sabotage and enable. Explore aspects of self-sabotage and enabling in yourself and how to limit these self-destructive behaviors in everyday life.   Therapeutic Goals: 1. Patient will identify one obstacle that relates to self-sabotage and enabling behaviors 2. Patient will identify one personal self-sabotaging or enabling behavior they did prior to admission 3. Patient will state a plan to change the above identified behavior 4. Patient will demonstrate ability to communicate their needs through discussion and/or role play.    Summary of Patient Progress:  Patient did not attend group despite encouraged participation.    Therapeutic Modalities:  Cognitive Behavioral Therapy Person-Centered Therapy Motivational Interviewing    Tammey Deeg W Kirandeep Fariss, LCSWA 

## 2022-04-17 NOTE — BH IP Treatment Plan (Signed)
Interdisciplinary Treatment and Diagnostic Plan Update  04/17/2022 Time of Session: 09:34 [name] [name] MRN: [id number]  Principal Diagnosis: Schizoaffective disorder, bipolar type ([place])  Secondary Diagnoses: Principal Problem:   Schizoaffective disorder, bipolar type ([place])   Current Medications:  Current Facility-Administered Medications  Medication Dose Route Frequency Provider Last Rate Last Admin   acetaminophen (TYLENOL) tablet 650 mg  650 mg Oral Q6H PRN Caroline Sauger, NP       alum & mag hydroxide-simeth (MAALOX/MYLANTA) 200-200-20 MG/5ML suspension 30 mL  30 mL Oral Q4H PRN Caroline Sauger, NP       amLODipine (NORVASC) tablet 10 mg  10 mg Oral Daily Caroline Sauger, NP   10 mg at 04/17/22 0816   divalproex (DEPAKOTE ER) 24 hr tablet 500 mg  500 mg Oral BID Caroline Sauger, NP   500 mg at 04/17/22 0816   magnesium hydroxide (MILK OF MAGNESIA) suspension 30 mL  30 mL Oral Daily PRN Caroline Sauger, NP       Derrill Memo ON 05/10/2022] paliperidone (INVEGA SUSTENNA) injection 234 mg  234 mg Intramuscular Q28 days Caroline Sauger, NP       paliperidone (INVEGA) 24 hr tablet 6 mg  6 mg Oral Daily Caroline Sauger, NP   6 mg at 04/17/22 0816   traZODone (DESYREL) tablet 100 mg  100 mg Oral QHS Caroline Sauger, NP       PTA Medications: Medications Prior to Admission  Medication Sig Dispense Refill Last Dose   amLODipine (NORVASC) 10 MG tablet Take 1 tablet (10 mg total) by mouth daily. (Patient not taking: Reported on 03/30/2022) 7 tablet 0    clozapine (CLOZARIL) 200 MG tablet Take 1 tablet (200 mg total) by mouth at bedtime. (Patient not taking: Reported on 03/30/2022) 7 tablet 0    divalproex (DEPAKOTE ER) 500 MG 24 hr tablet Take 500 mg by mouth 2 (two) times daily. (Patient not taking: Reported on 03/30/2022)      divalproex (DEPAKOTE) 500 MG DR tablet Take 1 tablet (500 mg total) by mouth every 12 (twelve) hours. (Patient not taking: Reported on  03/30/2022) 14 tablet 0    paliperidone (INVEGA SUSTENNA) 234 MG/1.5ML injection Inject 234 mg into the muscle every 28 (twenty-eight) days. (Patient not taking: Reported on 03/30/2022) 1.5 mL 1     Patient Stressors:    Patient Strengths:    Treatment Modalities: Medication Management, Group therapy, Case management,  1 to 1 session with clinician, Psychoeducation, Recreational therapy.   Physician Treatment Plan for Primary Diagnosis: Schizoaffective disorder, bipolar type ([place]) Long Term Goal(s):     Short Term Goals:    Medication Management: Evaluate patient's response, side effects, and tolerance of medication regimen.  Therapeutic Interventions: 1 to 1 sessions, Unit Group sessions and Medication administration.  Evaluation of Outcomes: Not Met  Physician Treatment Plan for Secondary Diagnosis: Principal Problem:   Schizoaffective disorder, bipolar type ([place])  Long Term Goal(s):     Short Term Goals:       Medication Management: Evaluate patient's response, side effects, and tolerance of medication regimen.  Therapeutic Interventions: 1 to 1 sessions, Unit Group sessions and Medication administration.  Evaluation of Outcomes: Not Met   RN Treatment Plan for Primary Diagnosis: Schizoaffective disorder, bipolar type ([place]) Long Term Goal(s): Knowledge of disease and therapeutic regimen to maintain health will improve  Short Term Goals: Ability to remain free from injury will improve, Ability to verbalize frustration and anger appropriately will improve, Ability to demonstrate self-control, Ability to participate in  decision making will improve, Ability to verbalize feelings will improve, Ability to disclose and discuss suicidal ideas, Ability to identify and develop effective coping behaviors will improve, and Compliance with prescribed medications will improve  Medication Management: RN will administer medications as ordered by provider, will assess and evaluate patient's  response and provide education to patient for prescribed medication. RN will report any adverse and/or side effects to prescribing provider.  Therapeutic Interventions: 1 on 1 counseling sessions, Psychoeducation, Medication administration, Evaluate responses to treatment, Monitor vital signs and CBGs as ordered, Perform/monitor CIWA, COWS, AIMS and Fall Risk screenings as ordered, Perform wound care treatments as ordered.  Evaluation of Outcomes: Not Met   LCSW Treatment Plan for Primary Diagnosis: Schizoaffective disorder, bipolar type ([place]) Long Term Goal(s): Safe transition to appropriate next level of care at discharge, Engage patient in therapeutic group addressing interpersonal concerns.  Short Term Goals: Engage patient in aftercare planning with referrals and resources, Increase social support, Increase ability to appropriately verbalize feelings, Increase emotional regulation, Facilitate acceptance of mental health diagnosis and concerns, Facilitate patient progression through stages of change regarding substance use diagnoses and concerns, Identify triggers associated with mental health/substance abuse issues, and Increase skills for wellness and recovery  Therapeutic Interventions: Assess for all discharge needs, 1 to 1 time with Social worker, Explore available resources and support systems, Assess for adequacy in community support network, Educate family and significant other(s) on suicide prevention, Complete Psychosocial Assessment, Interpersonal group therapy.  Evaluation of Outcomes: Not Met   Progress in Treatment: Attending groups: No. Participating in groups: No. Taking medication as prescribed: Yes. Toleration medication: Yes. Family/Significant other contact made: No, will contact:  if given permission. Patient understands diagnosis: Yes. Discussing patient identified problems/goals with staff: Yes. Medical problems stabilized or resolved: Yes. Denies  suicidal/homicidal ideation: Yes. Issues/concerns per patient self-inventory: No. Other: none.  New problem(s) identified: No, Describe:  none identified.  New Short Term/Long Term Goal(s): detox, elimination of symptoms of psychosis, medication management for mood stabilization; elimination of SI thoughts; development of comprehensive mental wellness/sobriety plan.  Patient Goals:  "My goals are to get out of here as soon as possible."  Discharge Plan or Barriers: CSW will assist pt with development of an appropriate aftercare/discharge plan.   Reason for Continuation of Hospitalization: Medication stabilization Other; describe psychosis  Estimated Length of Stay: 1-7 days  Last 3 [place] Suicide Severity Risk Score: [place] Admission (Current) from 04/16/2022 in [facility] ED from 03/30/2022 in [place] Admission (Discharged) from 12/04/2021 in [place] No Risk No Risk No Risk       Last PHQ 2/9 Scores:     No data to display          Scribe for Treatment Team: Shirl Harris, LCSW 04/17/2022 10:21 AM

## 2022-04-17 NOTE — [facility] Suicide Risk Assessment (Signed)
[facility] INPATIENT:  Family/Significant Other Suicide Prevention Education  Suicide Prevention Education:  Patient Refusal for Family/Significant Other Suicide Prevention Education: The patient [name] [name] has refused to provide written consent for family/significant other to be provided Family/Significant Other Suicide Prevention Education during admission and/or prior to discharge.  Physician notified.  SPE completed with pt, as pt refused to consent to family contact. SPI pamphlet provided to pt and pt was encouraged to share information with support network, ask questions, and talk about any concerns relating to SPE. Pt denies access to guns/firearms and verbalized understanding of information provided. Mobile Crisis information also provided to pt.  Shirl Harris 04/17/2022, 2:45 PM

## 2022-04-17 NOTE — [facility] Counselor (Signed)
Adult Comprehensive Assessment  Patient ID: [name] [name], male   DOB: [birth year], 26 y.o.   MRN: [id number]  Information Source: Information source: Patient (Previous PSA from 12/04/21 encounter)  Current Stressors:  Patient states their primary concerns and needs for treatment are:: "I was loitering outsie a gas station and I was getting angry because I felt like someone injected my with the meningitis shot." Patient states their goals for this hospitilization and ongoing recovery are:: "My goals are to get out of here as soon as possible." Educational / Learning stressors: None reported Employment / Job issues: None reported Family Relationships: None reported Museum/gallery curator / Lack of resources (include bankruptcy): None reported Housing / Lack of housing: None reported Physical health (include injuries & life threatening diseases): None reported Social relationships: Previous noted that pt endorsed feeling of isolation which bother him slightly. Substance abuse: Pt denies any use but previously endorsed cannabis use. Bereavement / Loss: None reported  Living/Environment/Situation:  Living Arrangements: Parent, Other relatives Living conditions (as described by patient or guardian): "It's healthy." Who else lives in the home?: Pt lives with his mother and younger brother. How long has patient lived in current situation?: "Two weeks." Pt shares that prior to this he was in [place] with friends. What is atmosphere in current home: Comfortable, Supportive  Family History:  Marital status: Single Are you sexually active?: Yes What is your sexual orientation?: Heterosexual Has your sexual activity been affected by drugs, alcohol, medication, or emotional stress?: No Does patient have children?: No  Childhood History:  By whom was/is the patient raised?: Mother Additional childhood history information: All of pt's family lives in [place].  It has just been him, his mother, and  younger brother living in the [place]; reports father left at age 33. Description of patient's relationship with caregiver when they were a child: Describes his relationship with his mother as a child as "safe and fun . . . she gave me everything I needed . . . too much coddling and I was spoiled." Patient's description of current relationship with people who raised him/her: "Good" How were you disciplined when you got in trouble as a child/adolescent?: Patient reports he was not disciplined as a child. Does patient have siblings?: Yes Number of Siblings: 56 (Three older brothers and one younger brother) Description of patient's current relationship with siblings: "They're good." Did patient suffer any verbal/emotional/physical/sexual abuse as a child?: No Did patient suffer from severe childhood neglect?: No Has patient ever been sexually abused/assaulted/raped as an adolescent or adult?: No Was the patient ever a victim of a crime or a disaster?: No Witnessed domestic violence?: No Has patient been affected by domestic violence as an adult?: No  Education:  Highest grade of school patient has completed: Tenth grade Currently a student?: No Learning disability?: No  Employment/Work Situation:   Employment Situation: Unemployed (Per TTS, pt noted as being on disability for mental health reasons of an unknown length of time.) Patient's Job has Been Impacted by Current Illness: No What is the Longest Time Patient has Held a Job?: Three years per pt. Previously noted to have reported only one year Where was the Patient Employed at that Time?: [employer] per pt. However, per previous PSA he reported [employer] Has Patient ever Been in the [employer]?: No  Financial Resources:   Financial resources: Medicaid Does patient have a Programmer, applications or guardian?: No (Pt denies)  Alcohol/Substance Abuse:   What has been your use of drugs/alcohol  within the last 12 months?: During interaction,  pt denies any substance use in the past year. However, per previous PSA pt endorsed active cannabis use of approximately a month but based on availability. If attempted suicide, did drugs/alcohol play a role in this?:  (N/A) Alcohol/Substance Abuse Treatment Hx: Denies past history If yes, describe treatment: N/A Has alcohol/substance abuse ever caused legal problems?: No  Social Support System:   Patient's Community Support System: Good Describe Community Support System: "My ACTT team and little brother. Mom is there too." Type of faith/religion: Pt denies How does patient's faith help to cope with current illness?: N/A  Leisure/Recreation:   Do You Have Hobbies?: Yes Leisure and Hobbies: Reports he enjoys listening to music  Strengths/Needs:   What is the patient's perception of their strengths?: "I can't think of any off the rip." Patient states they can use these personal strengths during their treatment to contribute to their recovery: N/A Patient states these barriers may affect/interfere with their treatment: None reported Patient states these barriers may affect their return to the community: None reported  Discharge Plan:   Currently receiving community mental health services: Yes (From Whom) [name]) Patient states concerns and preferences for aftercare planning are: Pt states that he would like to continue with [facility]. Patient states they will know when they are safe and ready for discharge when: Pt believes that he is ready for discharge at this time. Does patient have access to transportation?: Yes Does patient have financial barriers related to discharge medications?: No Patient description of barriers related to discharge medications: N/A Will patient be returning to same living situation after discharge?: Yes  Summary/Recommendations:   Summary and Recommendations (to be completed by the evaluator): Patient is a 27 year old, single, male from [place]  [place] [facility] He shared that he is in the hospital because he was loitering outside a gas station and began to think that someone had injected him with the meningitis shot. Pt stated that his goal is discharge home as soon as possible. He appears to realize that his thoughts at the gas station were just that. Pt denied any history of abuse, trauma, or substance use. Some discrepancies present between information given in previous assessment and during this interaction around family members here in the [place], work history, and substance use. He lives with his mother and younger brother, to which he plans to return upon discharge. Pt receives outpatient mental health services through [facility]. Upon discharge, pt plans to continue services through [facility]. Recommendations include: crisis stabilization, therapeutic milieu, encourage group attendance and participation, medication management for mood stabilization and development of a comprehensive mental wellness plan.  Glenis Smoker. 04/17/2022

## 2022-04-18 DIAGNOSIS — F250 Schizoaffective disorder, bipolar type: Secondary | ICD-10-CM | POA: Diagnosis not present

## 2022-04-18 NOTE — Progress Notes (Signed)
[facility] MD Progress Note  04/18/2022 1:31 PM [name] [name]  MRN:  [id number] Subjective: Follow-up patient with schizophrenia or schizoaffective disorder.  Today he was laying in bed covered with a blanket which is his usual presentation to my memory but reportedly last night he was jumping around vocalizing being kind of agitated.  He is not threatening violence to anyone and not self-harming.  Still disorganized and paranoid in his thinking.  Tolerating medication Principal Problem: Schizoaffective disorder, bipolar type (HCC) Diagnosis: Principal Problem:   Schizoaffective disorder, bipolar type (HCC) Active Problems:   Hypertension  Total Time spent with patient: 30 minutes  Past Psychiatric History: Past history of schizoaffective disorder  Past Medical History:  Past Medical History:  Diagnosis Date   Asthma    Depression    Psychosis (HCC)    Schizoaffective disorder (HCC)     Past Surgical History:  Procedure Laterality Date   BACK SURGERY     I&D groin  2017   Family History: History reviewed. No pertinent family history. Family Psychiatric  History: See previous Social History:  Social History   Substance and Sexual Activity  Alcohol Use No     Social History   Substance and Sexual Activity  Drug Use Never    Social History   Socioeconomic History   Marital status: Single    Spouse name: Not on file   Number of children: Not on file   Years of education: Not on file   Highest education level: Not on file  Occupational History   Not on file  Tobacco Use   Smoking status: Never   Smokeless tobacco: Never  Vaping Use   Vaping Use: Some days  Substance and Sexual Activity   Alcohol use: No   Drug use: Never   Sexual activity: Not Currently  Other Topics Concern   Not on file  Social History Narrative   Not on file   Social Determinants of Health   Financial Resource Strain: Not on file  Food Insecurity: No Food Insecurity (04/17/2022)   Hunger  Vital Sign    Worried About Running Out of Food in the Last Year: Never true    Ran Out of Food in the Last Year: Never true  Transportation Needs: No Transportation Needs (04/17/2022)   PRAPARE - Administrator, Civil Service (Medical): No    Lack of Transportation (Non-Medical): No  Physical Activity: Not on file  Stress: Not on file  Social Connections: Not on file   Additional Social History:                         Sleep: Fair  Appetite:  Good  Current Medications: Current Facility-Administered Medications  Medication Dose Route Frequency Provider Last Rate Last Admin   acetaminophen (TYLENOL) tablet 650 mg  650 mg Oral Q6H PRN Gillermo Murdoch, NP       alum & mag hydroxide-simeth (MAALOX/MYLANTA) 200-200-20 MG/5ML suspension 30 mL  30 mL Oral Q4H PRN Gillermo Murdoch, NP       amLODipine (NORVASC) tablet 10 mg  10 mg Oral Daily Gillermo Murdoch, NP   10 mg at 04/18/22 0805   cloZAPine (CLOZARIL) tablet 50 mg  50 mg Oral QHS Macrae Wiegman T, MD   50 mg at 04/17/22 2132   divalproex (DEPAKOTE ER) 24 hr tablet 500 mg  500 mg Oral BID Gillermo Murdoch, NP   500 mg at 04/18/22 0805   magnesium hydroxide (  MILK OF MAGNESIA) suspension 30 mL  30 mL Oral Daily PRN Gillermo Murdoch, NP       Melene Muller ON 05/10/2022] paliperidone (INVEGA SUSTENNA) injection 234 mg  234 mg Intramuscular Q28 days Gillermo Murdoch, NP       paliperidone (INVEGA) 24 hr tablet 6 mg  6 mg Oral Daily Gillermo Murdoch, NP   6 mg at 04/18/22 0805   traZODone (DESYREL) tablet 100 mg  100 mg Oral QHS Gillermo Murdoch, NP   100 mg at 04/17/22 2132    Lab Results:  Results for orders placed or performed during the hospital encounter of 04/12/22 (from the past 48 hour(s))  SARS Coronavirus 2 by RT PCR (hospital order, performed in [facility] hospital lab) *cepheid single result test* Anterior Nasal Swab     Status: None   Collection Time: 04/16/22  8:09 PM    Specimen: Anterior Nasal Swab  Result Value Ref Range   SARS Coronavirus 2 by RT PCR NEGATIVE NEGATIVE    Comment: Performed at [facility], [address] [place], [place] [address]    Blood Alcohol level:  Lab Results  Component Value Date   [facility] <10 04/12/2022   ETH <10 03/30/2022    Metabolic Disorder Labs: Lab Results  Component Value Date   HGBA1C 5.1 12/03/2021   MPG 99.67 12/03/2021   MPG 91.06 07/19/2020   No results found for: "PROLACTIN" Lab Results  Component Value Date   CHOL 197 12/06/2021   TRIG 111 12/06/2021   HDL 46 12/06/2021   CHOLHDL 4.3 12/06/2021   VLDL 22 12/06/2021   LDLCALC 129 (H) 12/06/2021   LDLCALC 177 (H) 07/19/2020    Physical Findings: AIMS: Facial and Oral Movements Muscles of Facial Expression: None, normal Lips and Perioral Area: None, normal Jaw: None, normal Tongue: None, normal,Extremity Movements Upper (arms, wrists, hands, fingers): None, normal Lower (legs, knees, ankles, toes): None, normal, Trunk Movements Neck, shoulders, hips: None, normal, Overall Severity Severity of abnormal movements (highest score from questions above): None, normal Incapacitation due to abnormal movements: None, normal Patient's awareness of abnormal movements (rate only patient's report): No Awareness, Dental Status Current problems with teeth and/or dentures?: No Does patient usually wear dentures?: No  CIWA:    COWS:     Musculoskeletal: Strength & Muscle Tone: within normal limits Gait & Station: normal Patient leans: N/A  Psychiatric Specialty Exam:  Presentation  General Appearance: Casual; Well Groomed  Eye Contact:Good  Speech:Slow  Speech Volume:Decreased  Handedness:Right   Mood and Affect  Mood:Anxious  Affect:Inappropriate; Constricted   Thought Process  Thought Processes:Disorganized  Descriptions of Associations:Circumstantial  Orientation:Full (Time, Place and Person)  Thought Content:Paranoid  Ideation; Scattered; Illogical  History of Schizophrenia/Schizoaffective disorder:Yes  Duration of Psychotic Symptoms:Greater than six months  Hallucinations:No data recorded Ideas of Reference:Paranoia; Delusions  Suicidal Thoughts:No data recorded Homicidal Thoughts:No data recorded  Sensorium  Memory:Immediate Poor; Recent Poor; Remote Poor  Judgment:Poor  Insight:Poor   Executive Functions  Concentration:Fair  Attention Span:Poor  Recall:Poor  Fund of Knowledge:Poor  Language:Poor   Psychomotor Activity  Psychomotor Activity:No data recorded  Assets  Assets:Communication Skills; Desire for Improvement; Resilience; Social Support   Sleep  Sleep:No data recorded   Physical Exam: Physical Exam Vitals reviewed.  Constitutional:      Appearance: Normal appearance.  HENT:     Head: Normocephalic and atraumatic.     Mouth/Throat:     Pharynx: Oropharynx is clear.  Eyes:     Pupils: Pupils are  equal, round, and reactive to light.  Cardiovascular:     Rate and Rhythm: Normal rate and regular rhythm.  Pulmonary:     Effort: Pulmonary effort is normal.     Breath sounds: Normal breath sounds.  Abdominal:     General: Abdomen is flat.     Palpations: Abdomen is soft.  Musculoskeletal:        General: Normal range of motion.  Skin:    General: Skin is warm and dry.  Neurological:     General: No focal deficit present.     Mental Status: He is alert. Mental status is at baseline.  Psychiatric:        Attention and Perception: He is inattentive.        Mood and Affect: Mood normal. Affect is labile.        Speech: He is noncommunicative.        Behavior: Behavior is withdrawn.        Thought Content: Thought content normal.        Judgment: Judgment is impulsive.    Review of Systems  Constitutional: Negative.   HENT: Negative.    Eyes: Negative.   Respiratory: Negative.    Cardiovascular: Negative.   Gastrointestinal: Negative.    Musculoskeletal: Negative.   Skin: Negative.   Neurological: Negative.   Psychiatric/Behavioral: Negative.     Blood pressure 133/87, pulse 92, temperature 98.1 F (36.7 C), temperature source Oral, resp. rate 18, height 6\' 3"  (1.905 m), weight 103.3 kg, SpO2 99 %. Body mass index is 28.46 kg/m.   Treatment Plan Summary: Medication management and Plan blood pressure stable.  Psychosis still improving.  Just started Clozapine yesterday will not increase the dose today.  Probably in the next day or 2 continue titrating up.  Alethia Berthold, MD 04/18/2022, 1:31 PM

## 2022-04-18 NOTE — Progress Notes (Signed)
Patient dropped one pill on the floor, during morning med pass, so he only received 3 mg of his scheduled 6 mg. Patient was in a hurry to get back to bed, so that he can rest. MD will be notified.

## 2022-04-18 NOTE — Progress Notes (Signed)
Recreation Therapy Notes  Date: 04/18/2022   Time: 10:50 am   Location: Craft room    Behavioral response: N/A   Intervention Topic: Decision Making    Discussion/Intervention: Patient refused to attend group.   Clinical Observations/Feedback:  Patient refused to attend group.   Kenlea Woodell LRT/CTRS          Shaelyn Decarli 04/18/2022 11:03 AM

## 2022-04-18 NOTE — Plan of Care (Signed)
D- Patient alert and oriented. Patient presented in a pleasant mood on assessment stating that he slept "well" last night and had no complaints to voice to this Probation officer. Patient denies SI, HI, AVH, and pain at this time. Patient also denied any signs/symptoms of depression/anxiety, stating that overall, he is feeling "good". Patient had no stated goals for today.  A- Scheduled medications administered to patient, per MD orders. Support and encouragement provided.  Routine safety checks conducted every 15 minutes.  Patient informed to notify staff with problems or concerns.  R- No adverse drug reactions noted. Patient contracts for safety at this time. Patient compliant with medications. Patient receptive, calm, and cooperative. Patient isolates to room, sleeping majority of the day, except for meals and medication. Patient remains safe at this time.  Problem: Education: Goal: Knowledge of General Education information will improve Description: Including pain rating scale, medication(s)/side effects and non-pharmacologic comfort measures Outcome: Progressing   Problem: Health Behavior/Discharge Planning: Goal: Ability to manage health-related needs will improve Outcome: Progressing   Problem: Clinical Measurements: Goal: Ability to maintain clinical measurements within normal limits will improve Outcome: Progressing Goal: Will remain free from infection Outcome: Progressing Goal: Diagnostic test results will improve Outcome: Progressing Goal: Respiratory complications will improve Outcome: Progressing Goal: Cardiovascular complication will be avoided Outcome: Progressing   Problem: Activity: Goal: Risk for activity intolerance will decrease Outcome: Progressing   Problem: Nutrition: Goal: Adequate nutrition will be maintained Outcome: Progressing   Problem: Coping: Goal: Level of anxiety will decrease Outcome: Progressing   Problem: Elimination: Goal: Will not experience  complications related to bowel motility Outcome: Progressing Goal: Will not experience complications related to urinary retention Outcome: Progressing   Problem: Pain Managment: Goal: General experience of comfort will improve Outcome: Progressing   Problem: Safety: Goal: Ability to remain free from injury will improve Outcome: Progressing   Problem: Skin Integrity: Goal: Risk for impaired skin integrity will decrease Outcome: Progressing   Problem: Activity: Goal: Will verbalize the importance of balancing activity with adequate rest periods Outcome: Progressing   Problem: Education: Goal: Will be free of psychotic symptoms Outcome: Progressing Goal: Knowledge of the prescribed therapeutic regimen will improve Outcome: Progressing   Problem: Coping: Goal: Coping ability will improve Outcome: Progressing Goal: Will verbalize feelings Outcome: Progressing   Problem: Health Behavior/Discharge Planning: Goal: Compliance with prescribed medication regimen will improve Outcome: Progressing   Problem: Nutritional: Goal: Ability to achieve adequate nutritional intake will improve Outcome: Progressing   Problem: Role Relationship: Goal: Ability to communicate needs accurately will improve Outcome: Progressing Goal: Ability to interact with others will improve Outcome: Progressing   Problem: Safety: Goal: Ability to redirect hostility and anger into socially appropriate behaviors will improve Outcome: Progressing Goal: Ability to remain free from injury will improve Outcome: Progressing   Problem: Self-Care: Goal: Ability to participate in self-care as condition permits will improve Outcome: Progressing   Problem: Self-Concept: Goal: Will verbalize positive feelings about self Outcome: Progressing

## 2022-04-18 NOTE — Progress Notes (Signed)
Patient has been present in the dayroom, since dinner time, listening to music. Patient remains safe on the unit and staff will continue to monitor.

## 2022-04-18 NOTE — Progress Notes (Signed)
Patient presents with a bizarre affect, doing karate down the hallway. Pt denies SI/HI/AVH but presents as responding to internal stimuli. Pt observed interacting appropriately with staff and peers on the unit. Pt compliant with medication administration per MD orders. Pt given education, support, and encouragement to be active in his treatment plan. Pt being monitored Q 15 minutes for safety per unit protocol, remains safe on the unit 

## 2022-04-18 NOTE — Group Note (Signed)
[facility] LCSW Group Therapy Note   Group Date: 04/18/2022 Start Time: 1300 End Time: 1400   Type of Therapy/Topic:  Group Therapy:  Balance in Life  Participation Level:  Did Not Attend   Description of Group:    This group will address the concept of balance and how it feels and looks when one is unbalanced. Patients will be encouraged to process areas in their lives that are out of balance, and identify reasons for remaining unbalanced. Facilitators will guide patients utilizing problem- solving interventions to address and correct the stressor making their life unbalanced. Understanding and applying boundaries will be explored and addressed for obtaining  and maintaining a balanced life. Patients will be encouraged to explore ways to assertively make their unbalanced needs known to significant others in their lives, using other group members and facilitator for support and feedback.  Therapeutic Goals: Patient will identify two or more emotions or situations they have that consume much of in their lives. Patient will identify signs/triggers that life has become out of balance:  Patient will identify two ways to set boundaries in order to achieve balance in their lives:  Patient will demonstrate ability to communicate their needs through discussion and/or role plays  Summary of Patient Progress: X   Therapeutic Modalities:   Cognitive Behavioral Therapy Solution-Focused Therapy Assertiveness Training   Griffin Gerrard R Reesa Gotschall, LCSW 

## 2022-04-18 NOTE — [facility] Group Notes (Signed)
[facility] Group Notes:  (Nursing/MHT/Case Management/Adjunct)  Date:  04/18/2022  Time:  11:17 AM  Type of Therapy:   community meeting  Participation Level:  Did Not Attend    [name] [name] 04/18/2022, 11:17 AM 

## 2022-04-19 DIAGNOSIS — F250 Schizoaffective disorder, bipolar type: Secondary | ICD-10-CM | POA: Diagnosis not present

## 2022-04-19 LAB — CBC WITH DIFFERENTIAL/PLATELET
Abs Immature Granulocytes: 0.1 10*3/uL — ABNORMAL HIGH (ref 0.00–0.07)
Basophils Absolute: 0 10*3/uL (ref 0.0–0.1)
Basophils Relative: 0 %
Eosinophils Absolute: 0 10*3/uL (ref 0.0–0.5)
Eosinophils Relative: 0 %
HCT: 41.9 % (ref 39.0–52.0)
Hemoglobin: 14.3 g/dL (ref 13.0–17.0)
Immature Granulocytes: 2 %
Lymphocytes Relative: 26 %
Lymphs Abs: 1.7 10*3/uL (ref 0.7–4.0)
MCH: 27.8 pg (ref 26.0–34.0)
MCHC: 34.1 g/dL (ref 30.0–36.0)
MCV: 81.4 fL (ref 80.0–100.0)
Monocytes Absolute: 0.7 10*3/uL (ref 0.1–1.0)
Monocytes Relative: 11 %
Neutro Abs: 4 10*3/uL (ref 1.7–7.7)
Neutrophils Relative %: 61 %
Platelets: 257 10*3/uL (ref 150–400)
RBC: 5.15 MIL/uL (ref 4.22–5.81)
RDW: 12.7 % (ref 11.5–15.5)
WBC: 6.5 10*3/uL (ref 4.0–10.5)
nRBC: 0 % (ref 0.0–0.2)

## 2022-04-19 MED ORDER — LORAZEPAM 2 MG/ML IJ SOLN
2.0000 mg | Freq: Four times a day (QID) | INTRAMUSCULAR | Status: DC | PRN
  Administered 2022-04-19: 2 mg via INTRAMUSCULAR
  Filled 2022-04-19: qty 1

## 2022-04-19 MED ORDER — CLOZAPINE 25 MG PO TABS
75.0000 mg | ORAL_TABLET | Freq: Every day | ORAL | Status: DC
  Administered 2022-04-19 – 2022-04-21 (×3): 75 mg via ORAL
  Filled 2022-04-19 (×3): qty 3

## 2022-04-19 MED ORDER — LORAZEPAM 2 MG PO TABS
2.0000 mg | ORAL_TABLET | Freq: Four times a day (QID) | ORAL | Status: DC | PRN
  Administered 2022-04-20: 2 mg via ORAL
  Filled 2022-04-19: qty 1

## 2022-04-19 NOTE — Progress Notes (Signed)
Recreation Therapy Notes  INPATIENT RECREATION TR PLAN  Patient Details Name: [name] [name] MRN: [id number] DOB: May 03, 1996 Today's Date: 04/19/2022  Rec Therapy Plan Is patient appropriate for Therapeutic Recreation?: Yes Treatment times per week: at least 3 Estimated Length of Stay: 5-7 days TR Treatment/Interventions: Group participation (Comment)  Discharge Criteria Pt will be discharged from therapy if:: Discharged Treatment plan/goals/alternatives discussed and agreed upon by:: Patient/family  Discharge Summary     [name] 04/19/2022, 1:02 PM

## 2022-04-19 NOTE — Group Note (Signed)
[facility] LCSW Group Therapy Note   Group Date: 04/19/2022 Start Time: 1400 End Time: 1500  Type of Therapy and Topic:  Group Therapy:  Feelings around Relapse and Recovery  Participation Level:  Did Not Attend    Description of Group:    Patients in this group will discuss emotions they experience before and after a relapse. They will process how experiencing these feelings, or avoidance of experiencing them, relates to having a relapse. Facilitator will guide patients to explore emotions they have related to recovery. Patients will be encouraged to process which emotions are more powerful. They will be guided to discuss the emotional reaction significant others in their lives may have to patients' relapse or recovery. Patients will be assisted in exploring ways to respond to the emotions of others without this contributing to a relapse.  Therapeutic Goals: Patient will identify two or more emotions that lead to relapse for them:  Patient will identify two emotions that result when they relapse:  Patient will identify two emotions related to recovery:  Patient will demonstrate ability to communicate their needs through discussion and/or role plays.   Summary of Patient Progress: X   Therapeutic Modalities:   Cognitive Behavioral Therapy Solution-Focused Therapy Assertiveness Training Relapse Prevention Therapy   Isak Sotomayor R Keeven Matty, LCSW 

## 2022-04-19 NOTE — Consult Note (Signed)
PHARMACIST - PHYSICIAN ORDER COMMUNICATION  [name] [name] is a 26 y.o. year old male with a history of schizoaffective disorder, and bipolar on Clozapine PTA. Continuing this medication order as an inpatient requires that monitoring parameters per REMS requirements must be met.   Clozapine REMS Dispense Authorization was obtained, and will dispense inpatient.  RDA code [id number].  Verified Initial Clozapine dose: Clozapine 66m PO HS 04/12/22:  581mQHS (ANC 4600/uL) 04/19/22: increased 5056mHS > 42m34mS  (ANC 4000) Last ANC value and date reported on the Clozapine REMS website:  4000 - 9/29 ANC [place] frequency: weekly Next ANC reporting is due on (date) 04/26/22.  [name] [name] 04/19/2022, 12:01 PM

## 2022-04-19 NOTE — Progress Notes (Signed)
Patient in the dayroom watching a movie throughout the afternoon after lunch. Patient continues to denies SI, HI, and AVH.

## 2022-04-19 NOTE — Progress Notes (Signed)
[facility] MD Progress Note  04/19/2022 10:45 AM [name] [name]  MRN:  [id number] Subjective: Patient seen and chart reviewed.  Patient's behavior has not been reasonably good this time.  Compared to other admissions he is not spending as much time laying in bed looking confused.  He was up today walking around the ward smile on his face but reasonably appropriate interaction.  A couple nights ago he had some agitation but last night apparently he was just up in the evening dancing a little bit but not disruptive.  No sign of any side effects to medicine. Principal Problem: Schizoaffective disorder, bipolar type ([place]) Diagnosis: Principal Problem:   Schizoaffective disorder, bipolar type ([place]) Active Problems:   Hypertension  Total Time spent with patient: 30 minutes  Past Psychiatric History: Past history of schizophrenia or schizoaffective disorder  Past Medical History:  Past Medical History:  Diagnosis Date   Asthma    Depression    Psychosis ([place])    Schizoaffective disorder ([place])     Past Surgical History:  Procedure Laterality Date   BACK SURGERY     I&D groin  2017   Family History: History reviewed. No pertinent family history. Family Psychiatric  History: See previous Social History:  Social History   Substance and Sexual Activity  Alcohol Use No     Social History   Substance and Sexual Activity  Drug Use Never    Social History   Socioeconomic History   Marital status: Single    Spouse name: Not on file   Number of children: Not on file   Years of education: Not on file   Highest education level: Not on file  Occupational History   Not on file  Tobacco Use   Smoking status: Never   Smokeless tobacco: Never  Vaping Use   Vaping Use: Some days  Substance and Sexual Activity   Alcohol use: No   Drug use: Never   Sexual activity: Not Currently  Other Topics Concern   Not on file  Social History Narrative   Not on file   Social Determinants of Health    Financial Resource Strain: Not on file  Food Insecurity: No Food Insecurity (04/17/2022)   Hunger Vital Sign    Worried About Running Out of Food in the Last Year: Never true    Ran Out of Food in the Last Year: Never true  Transportation Needs: No Transportation Needs (04/17/2022)   PRAPARE - Hydrologist (Medical): No    Lack of Transportation (Non-Medical): No  Physical Activity: Not on file  Stress: Not on file  Social Connections: Not on file   Additional Social History:                         Sleep: Fair  Appetite:  Fair  Current Medications: Current Facility-Administered Medications  Medication Dose Route Frequency Provider Last Rate Last Admin   acetaminophen (TYLENOL) tablet 650 mg  650 mg Oral Q6H PRN Caroline Sauger, NP       alum & mag hydroxide-simeth (MAALOX/MYLANTA) 200-200-20 MG/5ML suspension 30 mL  30 mL Oral Q4H PRN Caroline Sauger, NP       amLODipine (NORVASC) tablet 10 mg  10 mg Oral Daily Caroline Sauger, NP   10 mg at 04/19/22 0819   cloZAPine (CLOZARIL) tablet 50 mg  50 mg Oral QHS Roth Ress, Madie Reno, MD   50 mg at 04/18/22 2101   divalproex (  DEPAKOTE ER) 24 hr tablet 500 mg  500 mg Oral BID Gillermo Murdoch, NP   500 mg at 04/19/22 0819   magnesium hydroxide (MILK OF MAGNESIA) suspension 30 mL  30 mL Oral Daily PRN Gillermo Murdoch, NP       Melene Muller ON 05/10/2022] paliperidone (INVEGA SUSTENNA) injection 234 mg  234 mg Intramuscular Q28 days Gillermo Murdoch, NP       paliperidone (INVEGA) 24 hr tablet 6 mg  6 mg Oral Daily Gillermo Murdoch, NP   6 mg at 04/19/22 0819   traZODone (DESYREL) tablet 100 mg  100 mg Oral QHS Gillermo Murdoch, NP   100 mg at 04/18/22 2101    Lab Results: No results found for this or any previous visit (from the past 48 hour(s)).  Blood Alcohol level:  Lab Results  Component Value Date   ETH <10 04/12/2022   ETH <10 03/30/2022    Metabolic Disorder  Labs: Lab Results  Component Value Date   HGBA1C 5.1 12/03/2021   MPG 99.67 12/03/2021   MPG 91.06 07/19/2020   No results found for: "PROLACTIN" Lab Results  Component Value Date   CHOL 197 12/06/2021   TRIG 111 12/06/2021   HDL 46 12/06/2021   CHOLHDL 4.3 12/06/2021   VLDL 22 12/06/2021   LDLCALC 129 (H) 12/06/2021   LDLCALC 177 (H) 07/19/2020    Physical Findings: AIMS: Facial and Oral Movements Muscles of Facial Expression: None, normal Lips and Perioral Area: None, normal Jaw: None, normal Tongue: None, normal,Extremity Movements Upper (arms, wrists, hands, fingers): None, normal Lower (legs, knees, ankles, toes): None, normal, Trunk Movements Neck, shoulders, hips: None, normal, Overall Severity Severity of abnormal movements (highest score from questions above): None, normal Incapacitation due to abnormal movements: None, normal Patient's awareness of abnormal movements (rate only patient's report): No Awareness, Dental Status Current problems with teeth and/or dentures?: No Does patient usually wear dentures?: No  CIWA:    COWS:     Musculoskeletal: Strength & Muscle Tone: within normal limits Gait & Station: normal Patient leans: N/A  Psychiatric Specialty Exam:  Presentation  General Appearance:  Casual; Well Groomed  Eye Contact: Good  Speech: Slow  Speech Volume: Decreased  Handedness: Right   Mood and Affect  Mood: Anxious  Affect: Inappropriate; Constricted   Thought Process  Thought Processes: Disorganized  Descriptions of Associations:Circumstantial  Orientation:Full (Time, Place and Person)  Thought Content:Paranoid Ideation; Scattered; Illogical  History of Schizophrenia/Schizoaffective disorder:Yes  Duration of Psychotic Symptoms:Greater than six months  Hallucinations:No data recorded Ideas of Reference:Paranoia; Delusions  Suicidal Thoughts:No data recorded Homicidal Thoughts:No data recorded  Sensorium   Memory: Immediate Poor; Recent Poor; Remote Poor  Judgment: Poor  Insight: Poor   Executive Functions  Concentration: Fair  Attention Span: Poor  Recall: Poor  Fund of Knowledge: Poor  Language: Poor   Psychomotor Activity  Psychomotor Activity:No data recorded  Assets  Assets: Communication Skills; Desire for Improvement; Resilience; Social Support   Sleep  Sleep:No data recorded   Physical Exam: Physical Exam Vitals and nursing note reviewed.  Constitutional:      Appearance: Normal appearance.  HENT:     Head: Normocephalic and atraumatic.     Mouth/Throat:     Pharynx: Oropharynx is clear.  Eyes:     Pupils: Pupils are equal, round, and reactive to light.  Cardiovascular:     Rate and Rhythm: Normal rate and regular rhythm.  Pulmonary:     Effort: Pulmonary effort is normal.  Breath sounds: Normal breath sounds.  Abdominal:     General: Abdomen is flat.     Palpations: Abdomen is soft.  Musculoskeletal:        General: Normal range of motion.  Skin:    General: Skin is warm and dry.  Neurological:     General: No focal deficit present.     Mental Status: He is alert. Mental status is at baseline.  Psychiatric:        Attention and Perception: Attention normal.        Mood and Affect: Mood normal.        Speech: Speech normal.        Behavior: Behavior is cooperative.        Thought Content: Thought content normal.    Review of Systems  Constitutional: Negative.   HENT: Negative.    Eyes: Negative.   Respiratory: Negative.    Cardiovascular: Negative.   Gastrointestinal: Negative.   Musculoskeletal: Negative.   Skin: Negative.   Neurological: Negative.   Psychiatric/Behavioral: Negative.     Blood pressure (!) 141/86, pulse 90, temperature 97.6 F (36.4 C), temperature source Oral, resp. rate 18, height 6\' 3"  (1.905 m), weight 103.3 kg, SpO2 100 %. Body mass index is 28.46 kg/m.   Treatment Plan Summary: Medication  management and Plan no change to Invega.  Increase Clozapine to 75 mg tonight as he is tolerating it without any problem.  Encourage group attendance.  Talk with him about staying on a stable sleep schedule.  Could possibly be ready for discharge as early as Monday.  Friday, MD 04/19/2022, 10:45 AM

## 2022-04-19 NOTE — Progress Notes (Signed)
Patient is pacing and getting fixated on a male patient on the unit. He has been caught by staff looking into that patient's room. Patient has been notified by staff that this behavior is not acceptable and that he needs to stay only on his hall and away from patients, of the opposite sex, room. MD was notified via secure chat for new PRN orders, just in case it's needed.

## 2022-04-19 NOTE — Plan of Care (Signed)
D- Patient alert and oriented. Patient mood and affect is flat. Patient denies SI, HI, AVH, and pain. Patient not very verbal with Probation officer. Patient pacing and walking the hall.  A- Scheduled medications administered to patient, per MD orders. Support and encouragement provided.  Routine safety checks conducted every 15 minutes.  Patient informed to notify staff with problems or concerns.  R- No adverse drug reactions noted. Patient contracts for safety at this time. Patient compliant with medications and treatment plan. Patient receptive, calm, and cooperative. Patient interacts well with others on the unit. Patient remains safe at this time.   Problem: Education: Goal: Will be free of psychotic symptoms Outcome: Progressing Goal: Knowledge of the prescribed therapeutic regimen will improve Outcome: Progressing   Problem: Coping: Goal: Will verbalize feelings Outcome: Progressing   Problem: Nutritional: Goal: Ability to achieve adequate nutritional intake will improve Outcome: Progressing   Problem: Self-Care: Goal: Ability to participate in self-care as condition permits will improve Outcome: Not Progressing

## 2022-04-19 NOTE — Plan of Care (Signed)
  Problem: Nutrition: Goal: Adequate nutrition will be maintained Outcome: Progressing   Problem: Coping: Goal: Level of anxiety will decrease Outcome: Not Progressing   

## 2022-04-19 NOTE — Progress Notes (Signed)
Recreation Therapy Notes  INPATIENT RECREATION THERAPY ASSESSMENT  Patient Details Name: [name] [name] MRN: [id number] DOB: 1995/10/18 Today's Date: 04/19/2022       Information Obtained From: Patient  Able to Participate in Assessment/Interview: Yes  Patient Presentation: Responsive  Reason for Admission (Per Patient): Active Symptoms  Patient Stressors:    Coping Skills:   Music  Leisure Interests (2+):  Exercise - Walking, Social - Friends, Music - Listen  Frequency of Recreation/Participation: Weekly  Awareness of Community Resources:  Yes  Community Resources:  Park  Current Use: Yes  If no, Barriers?:    Expressed Interest in [place]: Yes  County of Residence:  [place]  Patient Main Form of Transportation: Musician  Patient Strengths:  Treat everone with respect  Patient Identified Areas of Improvement:  N/A  Patient Goal for Hospitalization:  To get out  Current SI (including self-harm):  No  Current HI:  No  Current AVH: No  Staff Intervention Plan: Group Attendance, Collaborate with Interdisciplinary Treatment Team  Consent to Intern Participation: N/A  Bogdan Vivona 04/19/2022, 1:01 PM

## 2022-04-19 NOTE — Progress Notes (Signed)
Recreation Therapy Notes   Date: 04/19/2022    Time: 10:30 am    Location: Craft room    Behavioral response: Appropriate   Intervention Topic:  Time Management    Discussion/Intervention:  Group content today was focused on time management. The group defined time management and identified healthy ways to manage time. Individuals expressed how much of the 24 hours they use in a day. Patients expressed how much time they use just for themselves personally. The group expressed how they have managed their time in the past. Individuals participated in the intervention "Managing Life" where they had a chance to see how much of the 24 hours they use and where it goes.   Clinical Observations/Feedback: Patient came to group late due to unknown reasons. Individual was social with peers and staff while participating in the intervention.   Baudelia Schroepfer LRT/CTRS             Skippy Marhefka 04/19/2022 12:43 PM
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
## 2022-04-20 NOTE — Progress Notes (Signed)
[facility] MD Progress Note  04/20/2022 5:01 PM [name] [name]  MRN:  [id number] Subjective: [name] [name] is seen on rounds today.  He remains in bed and states that he is doing fine.  He has been compliant with his medications and denies any side effects.  He has no complaints and no issues.  Principal Problem: Schizoaffective disorder, bipolar type ([facility]) Diagnosis: Principal Problem:   Schizoaffective disorder, bipolar type ([facility]) Active Problems:   Hypertension  Total Time spent with patient: 15 minutes  Past Psychiatric History: History of schizophrenia.  Past Medical History:  Past Medical History:  Diagnosis Date   Asthma    Depression    Psychosis ([facility])    Schizoaffective disorder ([facility])     Past Surgical History:  Procedure Laterality Date   BACK SURGERY     I&D groin  2017   Family History: History reviewed. No pertinent family history.  Social History:  Social History   Substance and Sexual Activity  Alcohol Use No     Social History   Substance and Sexual Activity  Drug Use Never    Social History   Socioeconomic History   Marital status: Single    Spouse name: Not on file   Number of children: Not on file   Years of education: Not on file   Highest education level: Not on file  Occupational History   Not on file  Tobacco Use   Smoking status: Never   Smokeless tobacco: Never  Vaping Use   Vaping Use: Some days  Substance and Sexual Activity   Alcohol use: No   Drug use: Never   Sexual activity: Not Currently  Other Topics Concern   Not on file  Social History Narrative   Not on file   Social Determinants of Health   Financial Resource Strain: Not on file  Food Insecurity: No Food Insecurity (04/17/2022)   Hunger Vital Sign    Worried About Running Out of Food in the Last Year: Never true    Ran Out of Food in the Last Year: Never true  Transportation Needs: No Transportation Needs (04/17/2022)   PRAPARE - Scientist, research (physical sciences) (Medical): No    Lack of Transportation (Non-Medical): No  Physical Activity: Not on file  Stress: Not on file  Social Connections: Not on file   Additional Social History:                         Sleep: Good  Appetite:  Good  Current Medications: Current Facility-Administered Medications  Medication Dose Route Frequency Provider Last Rate Last Admin   acetaminophen (TYLENOL) tablet 650 mg  650 mg Oral Q6H PRN Gillermo Murdoch, NP       alum & mag hydroxide-simeth (MAALOX/MYLANTA) 200-200-20 MG/5ML suspension 30 mL  30 mL Oral Q4H PRN Gillermo Murdoch, NP       amLODipine (NORVASC) tablet 10 mg  10 mg Oral Daily Gillermo Murdoch, NP   10 mg at 04/20/22 1050   cloZAPine (CLOZARIL) tablet 75 mg  75 mg Oral QHS [name], [name], MD   75 mg at 04/19/22 2110   divalproex (DEPAKOTE ER) 24 hr tablet 500 mg  500 mg Oral BID Gillermo Murdoch, NP   500 mg at 04/20/22 1041   LORazepam (ATIVAN) tablet 2 mg  2 mg Oral Q6H PRN [name], [name], MD       Or   LORazepam (ATIVAN) injection 2 mg  2 mg Intramuscular Q6H PRN [name], Madie Reno, MD   2 mg at 04/19/22 2124   magnesium hydroxide (MILK OF MAGNESIA) suspension 30 mL  30 mL Oral Daily PRN Caroline Sauger, NP       Derrill Memo ON 05/10/2022] paliperidone (INVEGA SUSTENNA) injection 234 mg  234 mg Intramuscular Q28 days Caroline Sauger, NP       paliperidone (INVEGA) 24 hr tablet 6 mg  6 mg Oral Daily Caroline Sauger, NP   6 mg at 04/20/22 1041   traZODone (DESYREL) tablet 100 mg  100 mg Oral QHS Caroline Sauger, NP   100 mg at 04/19/22 2110    Lab Results:  Results for orders placed or performed during the hospital encounter of 04/16/22 (from the past 48 hour(s))  CBC with Differential/Platelet     Status: Abnormal   Collection Time: 04/19/22 10:25 AM  Result Value Ref Range   WBC 6.5 4.0 - 10.5 K/uL   RBC 5.15 4.22 - 5.81 MIL/uL   Hemoglobin 14.3 13.0 - 17.0 g/dL   HCT 41.9 39.0 - 52.0 %    MCV 81.4 80.0 - 100.0 fL   MCH 27.8 26.0 - 34.0 pg   MCHC 34.1 30.0 - 36.0 g/dL   RDW 12.7 11.5 - 15.5 %   Platelets 257 150 - 400 K/uL   nRBC 0.0 0.0 - 0.2 %   Neutrophils Relative % 61 %   Neutro Abs 4.0 1.7 - 7.7 K/uL   Lymphocytes Relative 26 %   Lymphs Abs 1.7 0.7 - 4.0 K/uL   Monocytes Relative 11 %   Monocytes Absolute 0.7 0.1 - 1.0 K/uL   Eosinophils Relative 0 %   Eosinophils Absolute 0.0 0.0 - 0.5 K/uL   Basophils Relative 0 %   Basophils Absolute 0.0 0.0 - 0.1 K/uL   Immature Granulocytes 2 %   Abs Immature Granulocytes 0.10 (H) 0.00 - 0.07 K/uL    Comment: Performed at [facility], [address] [place], [place] [address]    Blood Alcohol level:  Lab Results  Component Value Date   [facility] <10 04/12/2022   ETH <10 88/41/6606    Metabolic Disorder Labs: Lab Results  Component Value Date   HGBA1C 5.1 12/03/2021   MPG 99.67 12/03/2021   MPG 91.06 07/19/2020   No results found for: "PROLACTIN" Lab Results  Component Value Date   CHOL 197 12/06/2021   TRIG 111 12/06/2021   HDL 46 12/06/2021   CHOLHDL 4.3 12/06/2021   VLDL 22 12/06/2021   LDLCALC 129 (H) 12/06/2021   LDLCALC 177 (H) 07/19/2020    Physical Findings: AIMS: Facial and Oral Movements Muscles of Facial Expression: None, normal Lips and Perioral Area: None, normal Jaw: None, normal Tongue: None, normal,Extremity Movements Upper (arms, wrists, hands, fingers): None, normal Lower (legs, knees, ankles, toes): None, normal, Trunk Movements Neck, shoulders, hips: None, normal, Overall Severity Severity of abnormal movements (highest score from questions above): None, normal Incapacitation due to abnormal movements: None, normal Patient's awareness of abnormal movements (rate only patient's report): No Awareness, Dental Status Current problems with teeth and/or dentures?: No Does patient usually wear dentures?: No  CIWA:    COWS:     Musculoskeletal: Strength & Muscle Tone:  within normal limits Gait & Station: normal Patient leans: N/A  Psychiatric Specialty Exam:  Presentation  General Appearance:  Casual; Well Groomed  Eye Contact: Good  Speech: Slow  Speech Volume: Decreased  Handedness: Right   Mood and Affect  Mood:  Anxious  Affect: Inappropriate; Constricted   Thought Process  Thought Processes: Disorganized  Descriptions of Associations:Circumstantial  Orientation:Full (Time, Place and Person)  Thought Content:Paranoid Ideation; Scattered; Illogical  History of Schizophrenia/Schizoaffective disorder:Yes  Duration of Psychotic Symptoms:Greater than six months  Hallucinations:No data recorded Ideas of Reference:Paranoia; Delusions  Suicidal Thoughts:No data recorded Homicidal Thoughts:No data recorded  Sensorium  Memory: Immediate Poor; Recent Poor; Remote Poor  Judgment: Poor  Insight: Poor   Executive Functions  Concentration: Fair  Attention Span: Poor  Recall: Poor  Fund of Knowledge: Poor  Language: Poor   Psychomotor Activity  Psychomotor Activity:No data recorded  Assets  Assets: Communication Skills; Desire for Improvement; Resilience; Social Support   Sleep  Sleep:No data recorded    Body mass index is 28.46 kg/m.   Treatment Plan Summary: Daily contact with patient to assess and evaluate symptoms and progress in treatment, Medication management, and Plan continue current medications.  Sarina Ill, DO 04/20/2022, 5:01 PM

## 2022-04-20 NOTE — Progress Notes (Signed)
Patient did not attend outdoor recreation time.  

## 2022-04-20 NOTE — Plan of Care (Signed)
Pt slept until late in the morning and was awakened to take his medications and get vital signs.  Patient denies SI HI AVH and has been calm and cooperative.

## 2022-04-21 NOTE — Plan of Care (Addendum)
D- Patient alert and oriented. Patient presented in a pleasant mood on assessment stating that he slept ok last night and had no complaints to voice to this Probation officer. Patient denied SI, HI, AVH, and pain at this time. Patient also denied any signs/symptoms of depression and anxiety, stating that he is feeling "good" overall. Patient had no stated goals for today.  A- Scheduled medications administered to patient, per MD orders. Support and encouragement provided.  Routine safety checks conducted every 15 minutes.  Patient informed to notify staff with problems or concerns.  R- No adverse drug reactions noted. Patient contracts for safety at this time. Patient compliant with medications and treatment plan. Patient receptive, calm, and cooperative. Patient interacts well with others on the unit. Patient isolates to room majority of the day, except for meals and medication. However, patient did come outside to the courtyard for a little while with staff and other members on the unit. Patient was shooting the basketball around and asked to hear a few songs played. Patient remains safe at this time.  Problem: Education: Goal: Knowledge of General Education information will improve Description: Including pain rating scale, medication(s)/side effects and non-pharmacologic comfort measures Outcome: Progressing   Problem: Health Behavior/Discharge Planning: Goal: Ability to manage health-related needs will improve Outcome: Progressing   Problem: Clinical Measurements: Goal: Ability to maintain clinical measurements within normal limits will improve Outcome: Progressing Goal: Will remain free from infection Outcome: Progressing Goal: Diagnostic test results will improve Outcome: Progressing Goal: Respiratory complications will improve Outcome: Progressing Goal: Cardiovascular complication will be avoided Outcome: Progressing   Problem: Activity: Goal: Risk for activity intolerance will  decrease Outcome: Progressing   Problem: Nutrition: Goal: Adequate nutrition will be maintained Outcome: Progressing   Problem: Coping: Goal: Level of anxiety will decrease Outcome: Progressing   Problem: Elimination: Goal: Will not experience complications related to bowel motility Outcome: Progressing Goal: Will not experience complications related to urinary retention Outcome: Progressing   Problem: Pain Managment: Goal: General experience of comfort will improve Outcome: Progressing   Problem: Safety: Goal: Ability to remain free from injury will improve Outcome: Progressing   Problem: Skin Integrity: Goal: Risk for impaired skin integrity will decrease Outcome: Progressing   Problem: Activity: Goal: Will verbalize the importance of balancing activity with adequate rest periods Outcome: Progressing   Problem: Education: Goal: Will be free of psychotic symptoms Outcome: Progressing Goal: Knowledge of the prescribed therapeutic regimen will improve Outcome: Progressing   Problem: Coping: Goal: Coping ability will improve Outcome: Progressing Goal: Will verbalize feelings Outcome: Progressing   Problem: Health Behavior/Discharge Planning: Goal: Compliance with prescribed medication regimen will improve Outcome: Progressing   Problem: Nutritional: Goal: Ability to achieve adequate nutritional intake will improve Outcome: Progressing   Problem: Role Relationship: Goal: Ability to communicate needs accurately will improve Outcome: Progressing Goal: Ability to interact with others will improve Outcome: Progressing   Problem: Safety: Goal: Ability to redirect hostility and anger into socially appropriate behaviors will improve Outcome: Progressing Goal: Ability to remain free from injury will improve Outcome: Progressing   Problem: Self-Care: Goal: Ability to participate in self-care as condition permits will improve Outcome: Progressing   Problem:  Self-Concept: Goal: Will verbalize positive feelings about self Outcome: Progressing

## 2022-04-21 NOTE — Progress Notes (Signed)
Patient alert and oriented x 4 no distress noted , interacting appropriately with peers and staff, he denies SI/HI/AVH 15 minutes safety checks maintained . Marland Kitchen

## 2022-04-21 NOTE — Progress Notes (Signed)
Patient's mother just called asking for an update on patient. Patient's mother is concerned that [place] makes patient's hallucinations worse and she is also concerned that he is back on Clozaril. Per mother, she feels as if this medication affects patient's heart and is wondering why he was put back on it.

## 2022-04-21 NOTE — Progress Notes (Signed)
[facility] MD Progress Note  04/21/2022 3:20 PM [place]  MRN:  [id number] Subjective: [name] [name] is seen on rounds.  He remains in bed a lot.  He has no complaints.  No issues.  No side effects.  Mood and affect are stable.  Principal Problem: Schizoaffective disorder, bipolar type ([place]) Diagnosis: Principal Problem:   Schizoaffective disorder, bipolar type ([place]) Active Problems:   Hypertension  Total Time spent with patient: 15 minutes  Past Psychiatric History: History of schizophrenia  Past Medical History:  Past Medical History:  Diagnosis Date   Asthma    Depression    Psychosis ([place])    Schizoaffective disorder ([place])     Past Surgical History:  Procedure Laterality Date   BACK SURGERY     I&D groin  2017   Family History: History reviewed. No pertinent family history. Social History:  Social History   Substance and Sexual Activity  Alcohol Use No     Social History   Substance and Sexual Activity  Drug Use Never    Social History   Socioeconomic History   Marital status: Single    Spouse name: Not on file   Number of children: Not on file   Years of education: Not on file   Highest education level: Not on file  Occupational History   Not on file  Tobacco Use   Smoking status: Never   Smokeless tobacco: Never  Vaping Use   Vaping Use: Some days  Substance and Sexual Activity   Alcohol use: No   Drug use: Never   Sexual activity: Not Currently  Other Topics Concern   Not on file  Social History Narrative   Not on file   Social Determinants of Health   Financial Resource Strain: Not on file  Food Insecurity: No Food Insecurity (04/17/2022)   Hunger Vital Sign    Worried About Running Out of Food in the Last Year: Never true    Ran Out of Food in the Last Year: Never true  Transportation Needs: No Transportation Needs (04/17/2022)   PRAPARE - Hydrologist (Medical): No    Lack of Transportation (Non-Medical): No   Physical Activity: Not on file  Stress: Not on file  Social Connections: Not on file   Additional Social History:                         Sleep: Good  Appetite:  Good  Current Medications: Current Facility-Administered Medications  Medication Dose Route Frequency Provider Last Rate Last Admin   acetaminophen (TYLENOL) tablet 650 mg  650 mg Oral Q6H PRN Caroline Sauger, NP       alum & mag hydroxide-simeth (MAALOX/MYLANTA) 200-200-20 MG/5ML suspension 30 mL  30 mL Oral Q4H PRN Caroline Sauger, NP       amLODipine (NORVASC) tablet 10 mg  10 mg Oral Daily Caroline Sauger, NP   10 mg at 04/21/22 0805   cloZAPine (CLOZARIL) tablet 75 mg  75 mg Oral QHS [name], [name], MD   75 mg at 04/20/22 2124   divalproex (DEPAKOTE ER) 24 hr tablet 500 mg  500 mg Oral BID Caroline Sauger, NP   500 mg at 04/21/22 0805   LORazepam (ATIVAN) tablet 2 mg  2 mg Oral Q6H PRN [name], Madie Reno, MD   2 mg at 04/20/22 2124   Or   LORazepam (ATIVAN) injection 2 mg  2 mg Intramuscular Q6H PRN [name], [name]  T, MD   2 mg at 04/19/22 2124   magnesium hydroxide (MILK OF MAGNESIA) suspension 30 mL  30 mL Oral Daily PRN Gillermo Murdoch, NP       Melene Muller ON 05/10/2022] paliperidone (INVEGA SUSTENNA) injection 234 mg  234 mg Intramuscular Q28 days Gillermo Murdoch, NP       paliperidone (INVEGA) 24 hr tablet 6 mg  6 mg Oral Daily Gillermo Murdoch, NP   6 mg at 04/21/22 0806   traZODone (DESYREL) tablet 100 mg  100 mg Oral QHS Gillermo Murdoch, NP   100 mg at 04/20/22 2124    Lab Results: No results found for this or any previous visit (from the past 48 hour(s)).  Blood Alcohol level:  Lab Results  Component Value Date   ETH <10 04/12/2022   ETH <10 03/30/2022    Metabolic Disorder Labs: Lab Results  Component Value Date   HGBA1C 5.1 12/03/2021   MPG 99.67 12/03/2021   MPG 91.06 07/19/2020   No results found for: "PROLACTIN" Lab Results  Component Value Date    CHOL 197 12/06/2021   TRIG 111 12/06/2021   HDL 46 12/06/2021   CHOLHDL 4.3 12/06/2021   VLDL 22 12/06/2021   LDLCALC 129 (H) 12/06/2021   LDLCALC 177 (H) 07/19/2020    Physical Findings: AIMS: Facial and Oral Movements Muscles of Facial Expression: None, normal Lips and Perioral Area: None, normal Jaw: None, normal Tongue: None, normal,Extremity Movements Upper (arms, wrists, hands, fingers): None, normal Lower (legs, knees, ankles, toes): None, normal, Trunk Movements Neck, shoulders, hips: None, normal, Overall Severity Severity of abnormal movements (highest score from questions above): None, normal Incapacitation due to abnormal movements: None, normal Patient's awareness of abnormal movements (rate only patient's report): No Awareness, Dental Status Current problems with teeth and/or dentures?: No Does patient usually wear dentures?: No  CIWA:    COWS:     Musculoskeletal: Strength & Muscle Tone: within normal limits Gait & Station: normal Patient leans: N/A  Psychiatric Specialty Exam:  Presentation  General Appearance:  Casual; Well Groomed  Eye Contact: Good  Speech: Slow  Speech Volume: Decreased  Handedness: Right   Mood and Affect  Mood: Anxious  Affect: Inappropriate; Constricted   Thought Process  Thought Processes: Disorganized  Descriptions of Associations:Circumstantial  Orientation:Full (Time, Place and Person)  Thought Content:Paranoid Ideation; Scattered; Illogical  History of Schizophrenia/Schizoaffective disorder:Yes  Duration of Psychotic Symptoms:Greater than six months  Hallucinations:No data recorded Ideas of Reference:Paranoia; Delusions  Suicidal Thoughts:No data recorded Homicidal Thoughts:No data recorded  Sensorium  Memory: Immediate Poor; Recent Poor; Remote Poor  Judgment: Poor  Insight: Poor   Executive Functions  Concentration: Fair  Attention Span: Poor  Recall: Poor  Fund of  Knowledge: Poor  Language: Poor   Psychomotor Activity  Psychomotor Activity:No data recorded  Assets  Assets: Communication Skills; Desire for Improvement; Resilience; Social Support   Sleep  Sleep:No data recorded    Blood pressure 123/60, pulse (!) 104, temperature 98.1 F (36.7 C), temperature source Oral, resp. rate 20, height 6\' 3"  (1.905 m), weight 103.3 kg, SpO2 100 %. Body mass index is 28.46 kg/m.   Treatment Plan Summary: Daily contact with patient to assess and evaluate symptoms and progress in treatment, Medication management, and Plan continue current medications.  [name] [name] , DO 04/21/2022, 3:20 PM

## 2022-04-21 NOTE — BHH Group Notes (Signed)
LCSW Wellness Group Note   04/21/2022 1:00pm  Type of Group and Topic: Psychoeducational Group:  Wellness  Participation Level:  minimal  Description of Group  Wellness group introduces the topic and its focus on developing healthy habits across the spectrum and its relationship to a decrease in hospital admissions.  Six areas of wellness are discussed: physical, social spiritual, intellectual, occupational, and emotional.  Patients are asked to consider their current wellness habits and to identify areas of wellness where they are interested and able to focus on improvements.    Therapeutic Goals Patients will understand components of wellness and how they can positively impact overall health.  Patients will identify areas of wellness where they have developed good habits. Patients will identify areas of wellness where they would like to make improvements.    Summary of Patient Progress: pt came to group about halfway through, pt was attentive but did not participate in discussion.  Pt did respond to CSW questions.  Pt identified social and intellectual as positive wellness areas  (could not really verbalize examples)  Pt identified emotional as wellness area that needed improvement.      Therapeutic Modalities: Cognitive Behavioral Therapy Psychoeducation    Joanne Chars, LCSW

## 2022-04-22 MED ORDER — AMLODIPINE BESYLATE 10 MG PO TABS: 10.0000 mg | ORAL_TABLET | Freq: Every day | ORAL | 1 refills | Status: DC

## 2022-04-22 MED ORDER — CLOZAPINE 100 MG PO TABS: 100.0000 mg | ORAL_TABLET | Freq: Every day | ORAL | Status: DC

## 2022-04-22 MED ORDER — CLOZAPINE 100 MG PO TABS: 100.0000 mg | ORAL_TABLET | Freq: Every day | ORAL | 1 refills | Status: DC

## 2022-04-22 MED ORDER — PALIPERIDONE PALMITATE ER 234 MG/1.5ML IM SUSY: 234.0000 mg | PREFILLED_SYRINGE | INTRAMUSCULAR | 1 refills | Status: DC

## 2022-04-22 MED ORDER — PALIPERIDONE ER 6 MG PO TB24: 6.0000 mg | ORAL_TABLET | Freq: Every day | ORAL | 1 refills | Status: DC

## 2022-04-22 MED ORDER — TRAZODONE HCL 100 MG PO TABS: 100.0000 mg | ORAL_TABLET | Freq: Every day | ORAL | 1 refills | Status: DC

## 2022-04-22 MED ORDER — DIVALPROEX SODIUM ER 500 MG PO TB24: 500.0000 mg | ORAL_TABLET | Freq: Two times a day (BID) | ORAL | 1 refills | Status: DC

## 2022-04-22 NOTE — Progress Notes (Signed)
Patient ID: [name] [name], male   DOB: 1995-12-10, 26 y.o.   MRN: [id number]  Discharge Note:  Patient denies SI/HI/AVH at this time. Discharge instructions, AVS, prescriptions, and transition record gone over with patient. Patient agrees to comply with medication management, follow-up visit, and outpatient therapy. Patient belongings returned to patient. Patient questions and concerns addressed and answered. Patient ambulatory off unit. Patient discharged to home via [place] services.

## 2022-04-22 NOTE — Progress Notes (Signed)
  [facility] Adult Case Management Discharge Plan :  Will you be returning to the same living situation after discharge:  Yes,  pt plans to return home. At discharge, do you have transportation home?: Yes,  CSW to make transportation arrangements. Do you have the ability to pay for your medications: Yes,  [employer].  Release of information consent forms completed and in the chart;  Patient's signature needed at discharge.  Patient to Follow up at:  Follow-up [facility]. Follow up.   Why: Your appointment is Tuesday, 04/23/22 at 11am. Thanks! Contact information: [address] [place] [address] [phone]                 Next level of care provider has access to [facility] and Suicide Prevention discussed: Yes,  SPE completed with pt.     Has patient been referred to the Quitline?: N/A patient is not a smoker  Patient has been referred for addiction treatment: N/A  Shirl Harris, LCSW 04/22/2022, 10:55 AM

## 2022-04-22 NOTE — Progress Notes (Signed)
Patient alert and oriented x 4 no distress noted he appears less anxious, not pacing or restless. Patient was noted in the milieu interacting appropriately with peers and staff, he denies SI/HI/AVH. Patient is complaint with medication regimen. 15 minutes safety checks maintained .

## 2022-04-22 NOTE — Progress Notes (Signed)
Recreation Therapy Notes  INPATIENT RECREATION TR PLAN  Patient Details Name: [name] [name] MRN: [id number] DOB: 1995/12/30 Today's Date: 04/22/2022  Rec Therapy Plan Is patient appropriate for Therapeutic Recreation?: Yes Treatment times per week: at least 3 Estimated Length of Stay: 5-7 days TR Treatment/Interventions: Group participation (Comment)  Discharge Criteria Pt will be discharged from therapy if:: Discharged Treatment plan/goals/alternatives discussed and agreed upon by:: Patient/family  Discharge Summary Short term goals set: Patient will engage in groups without prompting or encouragement from LRT x3 group sessions within 5 recreation therapy group sessions Short term goals met: Adequate for discharge Progress toward goals comments: Groups attended Which groups?: Other (Comment) (Creative Expressions, Time Management) Reason goals not met: N/A Therapeutic equipment acquired: N/A Reason patient discharged from therapy: Discharge from hospital Pt/family agrees with progress & goals achieved: Yes Date patient discharged from therapy: 04/22/22   Dewitte Vannice 04/22/2022, 2:50 PM

## 2022-04-22 NOTE — BHH Suicide Risk Assessment (Signed)
[facility] Discharge Suicide Risk Assessment   Principal Problem: Schizoaffective disorder, bipolar type [facility]) Discharge Diagnoses: Principal Problem:   Schizoaffective disorder, bipolar type ([place]) Active Problems:   Hypertension   Total Time spent with patient: 30 minutes  Musculoskeletal: Strength & Muscle Tone: within normal limits Gait & Station: normal Patient leans: N/A  Psychiatric Specialty Exam  Presentation  General Appearance:  Casual; Well Groomed  Eye Contact: Good  Speech: Slow  Speech Volume: Decreased  Handedness: Right   Mood and Affect  Mood: Anxious  Duration of Depression Symptoms: Less than two weeks  Affect: Inappropriate; Constricted   Thought Process  Thought Processes: Disorganized  Descriptions of Associations:Circumstantial  Orientation:Full (Time, Place and Person)  Thought Content:Paranoid Ideation; Scattered; Illogical  History of Schizophrenia/Schizoaffective disorder:Yes  Duration of Psychotic Symptoms:Greater than six months  Hallucinations:No data recorded Ideas of Reference:Paranoia; Delusions  Suicidal Thoughts:No data recorded Homicidal Thoughts:No data recorded  Sensorium  Memory: Immediate Poor; Recent Poor; Remote Poor  Judgment: Poor  Insight: Poor   Executive Functions  Concentration: Fair  Attention Span: Poor  Recall: Poor  Fund of Knowledge: Poor  Language: Poor   Psychomotor Activity  Psychomotor Activity:No data recorded  Assets  Assets: Communication Skills; Desire for Improvement; Resilience; Social Support   Sleep  Sleep:No data recorded  Physical Exam: Physical Exam Vitals and nursing note reviewed.  Constitutional:      Appearance: Normal appearance.  HENT:     Head: Normocephalic and atraumatic.     Mouth/Throat:     Pharynx: Oropharynx is clear.  Eyes:     Pupils: Pupils are equal, round, and reactive to light.  Cardiovascular:     Rate and Rhythm: Normal  rate and regular rhythm.  Pulmonary:     Effort: Pulmonary effort is normal.     Breath sounds: Normal breath sounds.  Abdominal:     General: Abdomen is flat.     Palpations: Abdomen is soft.  Musculoskeletal:        General: Normal range of motion.  Skin:    General: Skin is warm and dry.  Neurological:     General: No focal deficit present.     Mental Status: He is alert. Mental status is at baseline.  Psychiatric:        Mood and Affect: Mood normal.        Thought Content: Thought content normal.    Review of Systems  Constitutional: Negative.   HENT: Negative.    Eyes: Negative.   Respiratory: Negative.    Cardiovascular: Negative.   Gastrointestinal: Negative.   Musculoskeletal: Negative.   Skin: Negative.   Neurological: Negative.   Psychiatric/Behavioral: Negative.     Blood pressure (!) 120/90, pulse 83, temperature 98 F (36.7 C), temperature source Oral, resp. rate 18, height 6\' 3"  (1.905 m), weight 103.3 kg, SpO2 100 %. Body mass index is 28.46 kg/m.  Mental Status Per Nursing Assessment::   On Admission:  NA  Demographic Factors:  Male and Adolescent or young adult  Loss Factors: Legal issues  Historical Factors: Impulsivity  Risk Reduction Factors:   Sense of responsibility to family and Positive social support  Continued Clinical Symptoms:  Schizophrenia:   Less than 28 years old  Cognitive Features That Contribute To Risk:  None    Suicide Risk:  Minimal: No identifiable suicidal ideation.  Patients presenting with no risk factors but with morbid ruminations; may be classified as minimal risk based on the severity of the depressive symptoms  Plan Of Care/Follow-up recommendations:  Other:  Patient will be discharged today to home.  Appointments made for outpatient treatment.  Prescriptions provided.  Patient has been counseled repeatedly again about the importance of staying compliant with all of his medications.  Strongly encouraged to  continue following up with [facility].  Prescriptions will be sent to the pharmacy at their request.  Patient states agreement with the plan.  No longer meets commitment criteria and not currently hostile agitated or obviously psychotic  Alethia Berthold, MD 04/22/2022, 10:48 AM

## 2022-04-22 NOTE — Progress Notes (Signed)
D- Patient alert and oriented. Patient presents in an anxious, but pleasant mood on assessment stating that he slept "well" last night and had no complaints to voice to this Probation officer. Patient  denies SI, HI, AVH, and pain at this time. Patient also denies any signs/symptoms of depression and anxiety stating that he feels like he is ready to discharge. Patient had no stated goals for today.  A- Scheduled medications administered to patient, per MD orders. Support and encouragement provided.  Routine safety checks conducted every 15 minutes. Patient informed to notify staff with problems or concerns.  R- No adverse drug reactions noted. Patient contracts for safety at this time. Patient compliant with medications and treatment plan. Patient receptive, calm, and cooperative. Patient interacts well with others on the unit. Patient remains safe at this time.

## 2022-04-22 NOTE — Progress Notes (Signed)
Recreation Therapy Notes  Date: 04/22/2022   Time: 10:30 am    Location: Craft room    Behavioral response: Appropriate   Intervention Topic: Creative Expressions     Discussion/Intervention:  Group content on today was focused on creative expressions. The group defined creative expressions and ways they use creative expressions. Individual identified other positive ways creative expressions can be used and why it is important to express yourself. Patients participated in the intervention "expressive painting", where they had a chance to creatively express themselves.  Clinical Observations/Feedback: Patient came to group was focused on what peers and staff had to say about creative expressions. Participant was pulled from group by doctor and later returned. Individual was social with peers and staff while participating in the intervention.   Brodyn Depuy LRT/CTRS             Jaydon Soroka 04/22/2022 12:59 PM

## 2022-04-22 NOTE — BH IP Treatment Plan (Signed)
Interdisciplinary Treatment and Diagnostic Plan Update  04/22/2022 Time of Session: 08:30 [name] [name] MRN: [id number]  Principal Diagnosis: Schizoaffective disorder, bipolar type ([place])  Secondary Diagnoses: Principal Problem:   Schizoaffective disorder, bipolar type ([place]) Active Problems:   Hypertension   Current Medications:  Current Facility-Administered Medications  Medication Dose Route Frequency Provider Last Rate Last Admin   acetaminophen (TYLENOL) tablet 650 mg  650 mg Oral Q6H PRN Caroline Sauger, NP       alum & mag hydroxide-simeth (MAALOX/MYLANTA) 200-200-20 MG/5ML suspension 30 mL  30 mL Oral Q4H PRN Caroline Sauger, NP       amLODipine (NORVASC) tablet 10 mg  10 mg Oral Daily Caroline Sauger, NP   10 mg at 04/22/22 0818   cloZAPine (CLOZARIL) tablet 75 mg  75 mg Oral QHS [name], [name], MD   75 mg at 04/21/22 2108   divalproex (DEPAKOTE ER) 24 hr tablet 500 mg  500 mg Oral BID Caroline Sauger, NP   500 mg at 04/22/22 0818   LORazepam (ATIVAN) tablet 2 mg  2 mg Oral Q6H PRN [name], Madie Reno, MD   2 mg at 04/20/22 2124   Or   LORazepam (ATIVAN) injection 2 mg  2 mg Intramuscular Q6H PRN [name], [name], MD   2 mg at 04/19/22 2124   magnesium hydroxide (MILK OF MAGNESIA) suspension 30 mL  30 mL Oral Daily PRN Caroline Sauger, NP       Derrill Memo ON 05/10/2022] paliperidone (INVEGA SUSTENNA) injection 234 mg  234 mg Intramuscular Q28 days Caroline Sauger, NP       paliperidone (INVEGA) 24 hr tablet 6 mg  6 mg Oral Daily Caroline Sauger, NP   6 mg at 04/22/22 0818   traZODone (DESYREL) tablet 100 mg  100 mg Oral QHS Caroline Sauger, NP   100 mg at 04/21/22 2108   PTA Medications: Medications Prior to Admission  Medication Sig Dispense Refill Last Dose   amLODipine (NORVASC) 10 MG tablet Take 1 tablet (10 mg total) by mouth daily. (Patient not taking: Reported on 03/30/2022) 7 tablet 0    clozapine (CLOZARIL) 200 MG tablet Take 1 tablet  (200 mg total) by mouth at bedtime. (Patient not taking: Reported on 03/30/2022) 7 tablet 0    divalproex (DEPAKOTE ER) 500 MG 24 hr tablet Take 500 mg by mouth 2 (two) times daily. (Patient not taking: Reported on 03/30/2022)      divalproex (DEPAKOTE) 500 MG DR tablet Take 1 tablet (500 mg total) by mouth every 12 (twelve) hours. (Patient not taking: Reported on 03/30/2022) 14 tablet 0    paliperidone (INVEGA SUSTENNA) 234 MG/1.5ML injection Inject 234 mg into the muscle every 28 (twenty-eight) days. (Patient not taking: Reported on 03/30/2022) 1.5 mL 1     Patient Stressors:    Patient Strengths:    Treatment Modalities: Medication Management, Group therapy, Case management,  1 to 1 session with clinician, Psychoeducation, Recreational therapy.   Physician Treatment Plan for Primary Diagnosis: Schizoaffective disorder, bipolar type ([place]) Long Term Goal(s): Improvement in symptoms so as ready for discharge   Short Term Goals: Compliance with prescribed medications will improve Ability to verbalize feelings will improve Ability to demonstrate self-control will improve Ability to identify and develop effective coping behaviors will improve Ability to maintain clinical measurements within normal limits will improve  Medication Management: Evaluate patient's response, side effects, and tolerance of medication regimen.  Therapeutic Interventions: 1 to 1 sessions, Unit Group sessions and Medication administration.  Evaluation  of Outcomes: Adequate for Discharge  Physician Treatment Plan for Secondary Diagnosis: Principal Problem:   Schizoaffective disorder, bipolar type ([place]) Active Problems:   Hypertension  Long Term Goal(s): Improvement in symptoms so as ready for discharge   Short Term Goals: Compliance with prescribed medications will improve Ability to verbalize feelings will improve Ability to demonstrate self-control will improve Ability to identify and develop effective coping  behaviors will improve Ability to maintain clinical measurements within normal limits will improve     Medication Management: Evaluate patient's response, side effects, and tolerance of medication regimen.  Therapeutic Interventions: 1 to 1 sessions, Unit Group sessions and Medication administration.  Evaluation of Outcomes: Adequate for Discharge   RN Treatment Plan for Primary Diagnosis: Schizoaffective disorder, bipolar type ([place]) Long Term Goal(s): Knowledge of disease and therapeutic regimen to maintain health will improve  Short Term Goals: Ability to remain free from injury will improve, Ability to verbalize frustration and anger appropriately will improve, Ability to demonstrate self-control, Ability to participate in decision making will improve, Ability to verbalize feelings will improve, Ability to disclose and discuss suicidal ideas, Ability to identify and develop effective coping behaviors will improve, and Compliance with prescribed medications will improve  Medication Management: RN will administer medications as ordered by provider, will assess and evaluate patient's response and provide education to patient for prescribed medication. RN will report any adverse and/or side effects to prescribing provider.  Therapeutic Interventions: 1 on 1 counseling sessions, Psychoeducation, Medication administration, Evaluate responses to treatment, Monitor vital signs and CBGs as ordered, Perform/monitor CIWA, COWS, AIMS and Fall Risk screenings as ordered, Perform wound care treatments as ordered.  Evaluation of Outcomes: Adequate for Discharge   LCSW Treatment Plan for Primary Diagnosis: Schizoaffective disorder, bipolar type ([place]) Long Term Goal(s): Safe transition to appropriate next level of care at discharge, Engage patient in therapeutic group addressing interpersonal concerns.  Short Term Goals: Engage patient in aftercare planning with referrals and resources, Increase social  support, Increase ability to appropriately verbalize feelings, Increase emotional regulation, Facilitate acceptance of mental health diagnosis and concerns, Facilitate patient progression through stages of change regarding substance use diagnoses and concerns, Identify triggers associated with mental health/substance abuse issues, and Increase skills for wellness and recovery  Therapeutic Interventions: Assess for all discharge needs, 1 to 1 time with Social worker, Explore available resources and support systems, Assess for adequacy in community support network, Educate family and significant other(s) on suicide prevention, Complete Psychosocial Assessment, Interpersonal group therapy.  Evaluation of Outcomes: Adequate for Discharge   Progress in Treatment: Attending groups: No. Participating in groups: No. Taking medication as prescribed: Yes. Toleration medication: Yes. Family/Significant other contact made: No, will contact:  if given permission. Patient understands diagnosis: Yes. Discussing patient identified problems/goals with staff: Yes. Medical problems stabilized or resolved: Yes. Denies suicidal/homicidal ideation: Yes. Issues/concerns per patient self-inventory: No. Other: none.  New problem(s) identified: No, Describe:  none identified. Update 04/22/22: No changes at this time.   New Short Term/Long Term Goal(s): detox, elimination of symptoms of psychosis, medication management for mood stabilization; elimination of SI thoughts; development of comprehensive mental wellness/sobriety plan. Update 04/22/22: No changes at this time.   Patient Goals:  "My goals are to get out of here as soon as possible." Update 04/22/22: No changes at this time.   Discharge Plan or Barriers: CSW will assist pt with development of an appropriate aftercare/discharge plan. Update 04/22/22: Pt plans to discharge back home with follow up through his [facility] ACTT  services.    Reason for Continuation  of Hospitalization: Medication stabilization Other; describe psychosis   Estimated Length of Stay: 1-7 days Update 04/22/22: Pt discharge set for today.  Last 3 [place] Suicide Severity Risk Score: Flowsheet Row Admission (Current) from 04/16/2022 in [facility] ED from 03/30/2022 in [facility] Admission (Discharged) from 12/04/2021 in [facility] No Risk No Risk No Risk       Last PHQ 2/9 Scores:     No data to display          Scribe for Treatment Team: Shirl Harris, LCSW 04/22/2022 10:14 AM

## 2022-04-22 NOTE — Plan of Care (Signed)
  Problem: Group Participation Goal: STG - Patient will engage in groups without prompting or encouragement from LRT x3 group sessions within 5 recreation therapy group sessions Description: STG - Patient will engage in groups without prompting or encouragement from LRT x3 group sessions within 5 recreation therapy group sessions 04/22/2022 1448 by Ernest Haber, LRT Outcome: Adequate for Discharge 04/22/2022 1448 by Ernest Haber, LRT Outcome: Adequate for Discharge

## 2022-04-22 NOTE — Discharge Summary (Signed)
Physician Discharge Summary Note  Patient:  [name] [name] is an 26 y.o., male MRN:  [id number] DOB:  February 14, 1996 Patient phone:  [phone] (home)  Patient address:   [address] [place] [address],  Total Time spent with patient: 30 minutes  Date of Admission:  04/16/2022 Date of Discharge: 04/22/2022  Reason for Admission: Patient was admitted for paranoid agitated behavior disruptive behavior at home thought to be hyperactive family concerned about return of psychotic symptoms  Principal Problem: Schizoaffective disorder, bipolar type [facility]) Discharge Diagnoses: Principal Problem:   Schizoaffective disorder, bipolar type ([facility]) Active Problems:   Hypertension   Past Psychiatric History: Long history of psychotic disorder diagnosis either schizophrenia or schizoaffective disorder.  Has been shown to improve on medication but has frequently been compromised by noncompliance.  Follow-up is arranged through [facility].  Past Medical History:  Past Medical History:  Diagnosis Date   Asthma    Depression    Psychosis ([facility])    Schizoaffective disorder ([facility])     Past Surgical History:  Procedure Laterality Date   BACK SURGERY     I&D groin  2017   Family History: History reviewed. No pertinent family history. Family Psychiatric  History: See previous Social History:  Social History   Substance and Sexual Activity  Alcohol Use No     Social History   Substance and Sexual Activity  Drug Use Never    Social History   Socioeconomic History   Marital status: Single    Spouse name: Not on file   Number of children: Not on file   Years of education: Not on file   Highest education level: Not on file  Occupational History   Not on file  Tobacco Use   Smoking status: Never   Smokeless tobacco: Never  Vaping Use   Vaping Use: Some days  Substance and Sexual Activity   Alcohol use: No   Drug use: Never   Sexual activity: Not Currently  Other  Topics Concern   Not on file  Social History Narrative   Not on file   Social Determinants of Health   Financial Resource Strain: Not on file  Food Insecurity: No Food Insecurity (04/17/2022)   Hunger Vital Sign    Worried About Running Out of Food in the Last Year: Never true    Ran Out of Food in the Last Year: Never true  Transportation Needs: No Transportation Needs (04/17/2022)   PRAPARE - Administrator, Civil Service (Medical): No    Lack of Transportation (Non-Medical): No  Physical Activity: Not on file  Stress: Not on file  Social Connections: Not on file    Hospital Course: Patient admitted to psychiatric unit.  15-minute checks continued.  Patient did not display any dangerous aggressive violent behavior during his time in the hospital.  Initially somewhat hyperactive noted on many occasions to be dancing in the day room but was not at all aggressive or disruptive to anyone else.  Was compliant with medicine.  Patient was continued on his Invega injection from the emergency room and oral Invega and also restarted on clozapine which had been particularly helpful.  Titrated up to 100 mg at night at this point.  No longer showing signs of acute dangerousness and agrees to stay on medication.  Patient will be discharged home to his family follow-up with [name] team who are aware of the plan.  Physical Findings: AIMS: Facial and Oral Movements Muscles  of Facial Expression: None, normal Lips and Perioral Area: None, normal Jaw: None, normal Tongue: None, normal,Extremity Movements Upper (arms, wrists, hands, fingers): None, normal Lower (legs, knees, ankles, toes): None, normal, Trunk Movements Neck, shoulders, hips: None, normal, Overall Severity Severity of abnormal movements (highest score from questions above): None, normal Incapacitation due to abnormal movements: None, normal Patient's awareness of abnormal movements (rate only patient's report): No  Awareness, Dental Status Current problems with teeth and/or dentures?: No Does patient usually wear dentures?: No  CIWA:    COWS:     Musculoskeletal: Strength & Muscle Tone: within normal limits Gait & Station: normal Patient leans: N/A   Psychiatric Specialty Exam:  Presentation  General Appearance:  Casual; Well Groomed  Eye Contact: Good  Speech: Slow  Speech Volume: Decreased  Handedness: Right   Mood and Affect  Mood: Anxious  Affect: Inappropriate; Constricted   Thought Process  Thought Processes: Disorganized  Descriptions of Associations:Circumstantial  Orientation:Full (Time, Place and Person)  Thought Content:Paranoid Ideation; Scattered; Illogical  History of Schizophrenia/Schizoaffective disorder:Yes  Duration of Psychotic Symptoms:Greater than six months  Hallucinations:No data recorded Ideas of Reference:Paranoia; Delusions  Suicidal Thoughts:No data recorded Homicidal Thoughts:No data recorded  Sensorium  Memory: Immediate Poor; Recent Poor; Remote Poor  Judgment: Poor  Insight: Poor   Executive Functions  Concentration: Fair  Attention Span: Poor  Recall: Poor  Fund of Knowledge: Poor  Language: Poor   Psychomotor Activity  Psychomotor Activity:No data recorded  Assets  Assets: Communication Skills; Desire for Improvement; Resilience; Social Support   Sleep  Sleep:No data recorded   Physical Exam: Physical Exam Vitals and nursing note reviewed.  Constitutional:      Appearance: Normal appearance.  HENT:     Head: Normocephalic and atraumatic.     Mouth/Throat:     Pharynx: Oropharynx is clear.  Eyes:     Pupils: Pupils are equal, round, and reactive to light.  Cardiovascular:     Rate and Rhythm: Normal rate and regular rhythm.  Pulmonary:     Effort: Pulmonary effort is normal.     Breath sounds: Normal breath sounds.  Abdominal:     General: Abdomen is flat.     Palpations: Abdomen  is soft.  Musculoskeletal:        General: Normal range of motion.  Skin:    General: Skin is warm and dry.  Neurological:     General: No focal deficit present.     Mental Status: He is alert. Mental status is at baseline.  Psychiatric:        Mood and Affect: Mood normal.        Speech: Speech normal.        Behavior: Behavior normal.        Thought Content: Thought content normal.        Cognition and Memory: Cognition normal.        Judgment: Judgment normal.    Review of Systems  Constitutional: Negative.   HENT: Negative.    Eyes: Negative.   Respiratory: Negative.    Cardiovascular: Negative.   Gastrointestinal: Negative.   Musculoskeletal: Negative.   Skin: Negative.   Neurological: Negative.   Psychiatric/Behavioral: Negative.     Blood pressure (!) 120/90, pulse 83, temperature 98 F (36.7 C), temperature source Oral, resp. rate 18, height 6\' 3"  (1.905 m), weight 103.3 kg, SpO2 100 %. Body mass index is 28.46 kg/m.   Social History   Tobacco Use  Smoking Status Never  Smokeless  Tobacco Never   Tobacco Cessation:  N/A, patient does not currently use tobacco products   Blood Alcohol level:  Lab Results  Component Value Date   ETH <10 04/12/2022   ETH <10 03/30/2022    Metabolic Disorder Labs:  Lab Results  Component Value Date   HGBA1C 5.1 12/03/2021   MPG 99.67 12/03/2021   MPG 91.06 07/19/2020   No results found for: "PROLACTIN" Lab Results  Component Value Date   CHOL 197 12/06/2021   TRIG 111 12/06/2021   HDL 46 12/06/2021   CHOLHDL 4.3 12/06/2021   VLDL 22 12/06/2021   LDLCALC 129 (H) 12/06/2021   LDLCALC 177 (H) 07/19/2020    See Psychiatric Specialty Exam and Suicide Risk Assessment completed by Attending Physician prior to discharge.  Discharge destination:  Home  Is patient on multiple antipsychotic therapies at discharge:  Yes,   Do you recommend tapering to monotherapy for antipsychotics?  No   Has Patient had three or more  failed trials of antipsychotic monotherapy by history:  Yes,   Antipsychotic medications that previously failed include:   1.  Haldol., 2.  Invega., and 3.  Clozapine.  Recommended Plan for Multiple Antipsychotic Therapies: Second antipsychotic is Clozapine.  Reason for adding Clozapine patient has done best on combination therapy including clozapine and long-acting injectables  Discharge Instructions     Diet - low sodium heart healthy   Complete by: As directed    Increase activity slowly   Complete by: As directed       Allergies as of 04/22/2022   No Known Allergies      Medication List     TAKE these medications      Indication  amLODipine 10 MG tablet Commonly known as: NORVASC Take 1 tablet (10 mg total) by mouth daily.  Indication: High Blood Pressure Disorder   cloZAPine 100 MG tablet Commonly known as: CLOZARIL Take 1 tablet (100 mg total) by mouth at bedtime. What changed:  medication strength how much to take  Indication: Schizophrenia   divalproex 500 MG 24 hr tablet Commonly known as: DEPAKOTE ER Take 1 tablet (500 mg total) by mouth 2 (two) times daily. What changed: Another medication with the same name was removed. Continue taking this medication, and follow the directions you see here.  Indication: Manic Phase of Manic-Depression   paliperidone 234 MG/1.5ML injection Commonly known as: INVEGA SUSTENNA Inject 234 mg into the muscle every 28 (twenty-eight) days. Start taking on: May 06, 2022  Indication: Schizoaffective Disorder, Next dose due on 08/21/2020   paliperidone 6 MG 24 hr tablet Commonly known as: INVEGA Take 1 tablet (6 mg total) by mouth daily. Start taking on: April 23, 2022  Indication: Schizophrenia   traZODone 100 MG tablet Commonly known as: DESYREL Take 1 tablet (100 mg total) by mouth at bedtime.  Indication: Trouble Sleeping         Follow-up recommendations:  Other:  Follow-up arranged with [name]  Comments: Prescriptions provided  Signed: Mordecai Rasmussen, MD 04/22/2022, 10:53 AM

## 2022-04-22 NOTE — [facility] Counselor (Signed)
CSW met with pt briefly to discuss discharge. Pt stated that he is returning home upon discharge but will need a ride there. Address given is [address], [place], [place]. Pt agreeable to CSW contacting [facility]. He is a cigarette smoker but declined interest in cessation. Pt denied any substance use or interest in treatment. No other concerns expressed. Contact ended without incident.  CSW reached out to ACTT lead, Thayer Headings to notify of discharge.   CSW will continue to follow.   Chalmers Guest. Guerry Bruin, MSW, [place], [place] 04/22/2022 10:07 AM

## 2022-04-26 ENCOUNTER — Emergency Department (EMERGENCY_DEPARTMENT_HOSPITAL)
Admission: EM | Admit: 2022-04-26 | Discharge: 2022-04-27 | Disposition: A | Discharge status: Other Acute Inpatient Hospital | Payer: No Typology Code available for payment source | Source: Home / Self Care | Attending: Emergency Medicine | Admitting: Emergency Medicine

## 2022-04-26 ENCOUNTER — Other Ambulatory Visit: Payer: Self-pay

## 2022-04-26 DIAGNOSIS — Z79899 Other long term (current) drug therapy: Secondary | ICD-10-CM | POA: Insufficient documentation

## 2022-04-26 DIAGNOSIS — I10 Essential (primary) hypertension: Secondary | ICD-10-CM | POA: Insufficient documentation

## 2022-04-26 DIAGNOSIS — F29 Unspecified psychosis not due to a substance or known physiological condition: Secondary | ICD-10-CM | POA: Insufficient documentation

## 2022-04-26 DIAGNOSIS — F250 Schizoaffective disorder, bipolar type: Secondary | ICD-10-CM | POA: Insufficient documentation

## 2022-04-26 DIAGNOSIS — Z20822 Contact with and (suspected) exposure to covid-19: Secondary | ICD-10-CM | POA: Insufficient documentation

## 2022-04-26 LAB — COMPREHENSIVE METABOLIC PANEL WITH GFR
ALT: 23 U/L (ref 0–44)
AST: 20 U/L (ref 15–41)
Albumin: 4.2 g/dL (ref 3.5–5.0)
Alkaline Phosphatase: 76 U/L (ref 38–126)
Anion gap: 5 (ref 5–15)
BUN: 18 mg/dL (ref 6–20)
CO2: 25 mmol/L (ref 22–32)
Calcium: 8.9 mg/dL (ref 8.9–10.3)
Chloride: 110 mmol/L (ref 98–111)
Creatinine, Ser: 0.76 mg/dL (ref 0.61–1.24)
GFR, Estimated: >60 mL/min
Glucose, Bld: 97 mg/dL (ref 70–99)
Potassium: 3.9 mmol/L (ref 3.5–5.1)
Sodium: 140 mmol/L (ref 135–145)
Total Bilirubin: 0.5 mg/dL (ref 0.3–1.2)
Total Protein: 7.5 g/dL (ref 6.5–8.1)

## 2022-04-26 LAB — CBC WITH DIFFERENTIAL/PLATELET
Abs Immature Granulocytes: 0.03 K/uL (ref 0.00–0.07)
Basophils Absolute: 0 K/uL (ref 0.0–0.1)
Basophils Relative: 0 %
Eosinophils Absolute: 0 K/uL (ref 0.0–0.5)
Eosinophils Relative: 0 %
HCT: 44.6 % (ref 39.0–52.0)
Hemoglobin: 14.7 g/dL (ref 13.0–17.0)
Immature Granulocytes: 1 %
Lymphocytes Relative: 23 %
Lymphs Abs: 1.3 K/uL (ref 0.7–4.0)
MCH: 27.4 pg (ref 26.0–34.0)
MCHC: 33 g/dL (ref 30.0–36.0)
MCV: 83.2 fL (ref 80.0–100.0)
Monocytes Absolute: 0.6 K/uL (ref 0.1–1.0)
Monocytes Relative: 11 %
Neutro Abs: 3.9 K/uL (ref 1.7–7.7)
Neutrophils Relative %: 65 %
Platelets: 240 K/uL (ref 150–400)
RBC: 5.36 MIL/uL (ref 4.22–5.81)
RDW: 13.1 % (ref 11.5–15.5)
WBC: 5.9 K/uL (ref 4.0–10.5)
nRBC: 0 % (ref 0.0–0.2)

## 2022-04-26 LAB — ACETAMINOPHEN LEVEL: Acetaminophen (Tylenol), Serum: <10 ug/mL — ABNORMAL LOW (ref 10–30)

## 2022-04-26 LAB — RESP PANEL BY RT-PCR (FLU A&B, COVID) ARPGX2
Influenza A by PCR: NEGATIVE
Influenza B by PCR: NEGATIVE
SARS Coronavirus 2 by RT PCR: NEGATIVE

## 2022-04-26 LAB — URINALYSIS, ROUTINE W REFLEX MICROSCOPIC
Bilirubin Urine: NEGATIVE
Glucose, UA: NEGATIVE mg/dL
Hgb urine dipstick: NEGATIVE
Ketones, ur: NEGATIVE mg/dL
Leukocytes,Ua: NEGATIVE
Nitrite: NEGATIVE
Protein, ur: NEGATIVE mg/dL
Specific Gravity, Urine: 1.024 (ref 1.005–1.030)
pH: 7 (ref 5.0–8.0)

## 2022-04-26 LAB — ETHANOL: Alcohol, Ethyl (B): <10 mg/dL

## 2022-04-26 LAB — URINE DRUG SCREEN, QUALITATIVE (ARMC ONLY)
Amphetamines, Ur Screen: NOT DETECTED
Barbiturates, Ur Screen: NOT DETECTED
Benzodiazepine, Ur Scrn: NOT DETECTED
Cannabinoid 50 Ng, Ur ~~LOC~~: NOT DETECTED
Cocaine Metabolite,Ur ~~LOC~~: NOT DETECTED
MDMA (Ecstasy)Ur Screen: NOT DETECTED
Methadone Scn, Ur: NOT DETECTED
Opiate, Ur Screen: NOT DETECTED
Phencyclidine (PCP) Ur S: NOT DETECTED
Tricyclic, Ur Screen: NOT DETECTED

## 2022-04-26 LAB — SALICYLATE LEVEL: Salicylate Lvl: <7 mg/dL — ABNORMAL LOW (ref 7.0–30.0)

## 2022-04-26 MED ORDER — MELATONIN 5 MG PO TABS
5.0000 mg | ORAL_TABLET | Freq: Every day | ORAL | Status: DC
  Administered 2022-04-26: 5 mg via ORAL
  Filled 2022-04-26: qty 1

## 2022-04-26 NOTE — ED Notes (Signed)
Report to include Situation, Background, Assessment, and Recommendations received from Dawn RN. Patient alert and oriented, warm and dry, in no acute distress. Patient denies SI, HI, AVH and pain. Patient made aware of Q15 minute rounds and security cameras for their safety. Patient instructed to come to me with needs or concerns.  

## 2022-04-26 NOTE — ED Notes (Signed)
Snack and beverage given. 

## 2022-04-26 NOTE — ED Provider Notes (Signed)
[facility] Provider Note    None    (approximate)   History   Psychiatric Evaluation   HPI  [name] [name] is a 26 y.o. male with a history of schizophrenia who presents under involuntary commitment due to increasing hallucinations and erratic behavior.  Per the IVC paperwork, there is concern that the patient has not been compliant with his medication.  When asking why he is here he states it is because of the legal problem and denies any acute medical complaints.  He is unable to give any meaningful history.     Physical Exam   Triage Vital Signs: ED Triage Vitals  Enc Vitals Group     BP 04/26/22 1210 129/85     Pulse Rate 04/26/22 1210 97     Resp 04/26/22 1210 16     Temp 04/26/22 1210 97.9 F (36.6 C)     Temp Source 04/26/22 1210 Oral     SpO2 04/26/22 1210 94 %     Weight 04/26/22 1217 170 lb (77.1 kg)     Height --      Head Circumference --      Peak Flow --      Pain Score 04/26/22 1217 0     Pain Loc --      Pain Edu? --      Excl. in GC? --     Most recent vital signs: Vitals:   04/26/22 1210 04/26/22 1217  BP: 129/85 129/85  Pulse: 97 100  Resp: 16 18  Temp: 97.9 F (36.6 C) 97.9 F (36.6 C)  SpO2: 94% 93%     General: Awake, no distress.  CV:  Good peripheral perfusion.  Resp:  Normal effort.  Abd:  No distention.  Other:  Somewhat flat affect, disorganized thought.   ED Results / Procedures / Treatments   Labs (all labs ordered are listed, but only abnormal results are displayed) Labs Reviewed  SALICYLATE LEVEL - Abnormal; Notable for the following components:      Result Value   Salicylate Lvl <7.0 (*)    All other components within normal limits  ACETAMINOPHEN LEVEL - Abnormal; Notable for the following components:   Acetaminophen (Tylenol), Serum <10 (*)    All other components within normal limits  URINALYSIS, ROUTINE W REFLEX MICROSCOPIC - Abnormal; Notable for the following components:   Color,  Urine YELLOW (*)    APPearance CLEAR (*)    All other components within normal limits  RESP PANEL BY RT-PCR (FLU A&B, COVID) ARPGX2  COMPREHENSIVE METABOLIC PANEL  CBC WITH DIFFERENTIAL/PLATELET  ETHANOL  URINE DRUG SCREEN, QUALITATIVE ([facility] ONLY)     EKG     RADIOLOGY    PROCEDURES:  Critical Care performed: No  Procedures   MEDICATIONS ORDERED IN ED: Medications - No data to display   IMPRESSION / MDM / ASSESSMENT AND PLAN / ED COURSE  I reviewed the triage vital signs and the nursing notes.  26 year old male with a history of schizophrenia presents under involuntary commitment for increased erratic behavior and hallucinations.  The patient denies any acute complaints.  I reviewed the past medical records.  The patient was just admitted to inpatient psychiatry and discharged on 10/2.  He presented for paranoid agitated behavior and acute psychosis.  Differential diagnosis includes, but is not limited to, acute psychosis, schizoaffective disorder, substance-induced symptoms.  I have ordered psychiatry and TTS consults as well as lab work-up for medical screening.  Patient's presentation  is most consistent with acute presentation with potential threat to life or bodily function.  The patient has been placed in psychiatric observation due to the need to provide a safe environment for the patient while obtaining psychiatric consultation and evaluation, as well as ongoing medical and medication management to treat the patient's condition.  The patient has been placed under full IVC at this time.   ----------------------------------------- 4:31 PM on 04/26/2022 -----------------------------------------  Lab work-up was unremarkable, with normal electrolytes, normal CBC, and negative drug screen.  Psychiatry evaluation is pending.  Have signed the patient out to the oncoming ED physician Dr. Archie Balboa.   FINAL CLINICAL IMPRESSION(S) / ED DIAGNOSES   Final diagnoses:   Psychosis, unspecified psychosis type ([place])     Rx / DC Orders   ED Discharge Orders     None        Note:  This document was prepared using Dragon voice recognition software and may include unintentional dictation errors.    Arta Silence, MD 04/26/22 [phone]

## 2022-04-26 NOTE — ED Notes (Signed)
IVC/pending inpatient psych admit  

## 2022-04-26 NOTE — BH Assessment (Signed)
Comprehensive Clinical Assessment (CCA) Note  04/26/2022 [name] [name] [id number]  Chief Complaint:  Chief Complaint  Patient presents with   Psychiatric Evaluation   Visit Diagnosis: Schizoaffective disorder, bipolar type ([place])   [name] [name] is 26 year old who presents to the ER after being placed under IVC by his ACT Team. During their conversation, the patient was responding to internal stimuli and saying delusional things. Patient admitted to not taking his medications as prescribed. He also told the nurse he was going to kiss her and leaned in to do so. However, he was stopped by others. Per the patient, he does not know why he was brought to the ER. When asked about what was reported, he said he didn't no. During the interview, the patient was calm, cooperative and pleasant.   CCA Screening, Triage and Referral (STR)  Patient Reported Information How did you hear about [place]? Legal System  What Is the Reason for Your Visit/Call Today? Patient brought to the ER by his ACT Team  How Long Has This Been Causing You Problems? <Week  What Do You Feel Would Help You the Most Today? Treatment for Depression or other mood problem   Have You Recently Had Any Thoughts About Hurting Yourself? No  Are You Planning to Commit Suicide/Harm Yourself At This time? No   Have you Recently Had Thoughts About [place]? No  Are You Planning to Harm Someone at This Time? No  Explanation: No data recorded  Have You Used Any Alcohol or Drugs in the Past 24 Hours? No  How Long Ago Did You Use Drugs or Alcohol? No data recorded What Did You Use and How Much? No data recorded  Do You Currently Have a Therapist/Psychiatrist? No  Name of Therapist/Psychiatrist: [place] Recently Discharged From Any Mudlogger or Programs? No  Explanation of Discharge From Practice/Program: No data recorded    CCA Screening Triage Referral Assessment Type of  Contact: Face-to-Face  Telemedicine Service Delivery:   Is this Initial or Reassessment? No data recorded Date Telepsych consult ordered in [facility]:  No data recorded Time Telepsych consult ordered in [facility]:  No data recorded Location of Assessment: [facility] ED  Provider Location: [facility] ED   Collateral Involvement: [place] team Nurse   Does Patient Have a [place]? No  Legal Guardian Contact Information: No data recorded Copy of Legal Guardianship Form: No data recorded Legal Guardian Notified of Arrival: No data recorded Legal Guardian Notified of Pending Discharge: No data recorded If Minor and Not Living with Parent(s), Who has Custody? n/a  Is CPS involved or ever been involved? Never  Is APS involved or ever been involved? Never   Patient Determined To Be At Risk for Harm To Self or Others Based on Review of Patient Reported Information or Presenting Complaint? No  Method: No data recorded Availability of Means: No data recorded Intent: No data recorded Notification Required: No data recorded Additional Information for Danger to Others Potential: No data recorded Additional Comments for Danger to Others Potential: No data recorded Are There Guns or Other Weapons in Your Home? No data recorded Types of Guns/Weapons: No data recorded Are These Weapons Safely Secured?                            No data recorded Who Could Verify You Are Able To Have These Secured: No data recorded Do You  Have any Outstanding Charges, Pending Court Dates, Parole/Probation? No data recorded Contacted To Inform of Risk of Harm To Self or Others: Other: Comment    Does Patient Present under Involuntary Commitment? Yes  IVC Papers Initial File Date: 04/26/22   [place] of Residence: [place]   Patient Currently Receiving the Following Services: ACTT Psychologist, educational)   Determination of Need: Emergent (2 hours)   Options For Referral: ED  Visit   CCA Biopsychosocial Patient Reported Schizophrenia/Schizoaffective Diagnosis in Past: Yes   Strengths: Able to talk, in treatment and have stable housing.   Mental Health Symptoms Depression:   None   Duration of Depressive symptoms:  Duration of Depressive Symptoms: N/A   Mania:   N/A   Anxiety:    N/A   Psychosis:   None   Duration of Psychotic symptoms:    Trauma:   N/A   Obsessions:   N/A   Compulsions:   N/A   Inattention:   N/A   Hyperactivity/Impulsivity:   N/A   Oppositional/Defiant Behaviors:   N/A   Emotional Irregularity:   N/A   Other Mood/Personality Symptoms:  No data recorded   Mental Status Exam Appearance and self-care  Stature:   Average   Weight:   Average weight   Clothing:   Neat/clean; Age-appropriate   Grooming:   Normal   Cosmetic use:   None   Posture/gait:   Normal   Motor activity:   -- (Within normal range)   Sensorium  Attention:   Normal   Concentration:   Normal   Orientation:   X5   Recall/memory:   Normal   Affect and Mood  Affect:   Appropriate   Mood:   Other (Comment)   Relating  Eye contact:   Normal   Facial expression:   Responsive   Attitude toward examiner:   Cooperative   Thought and Language  Speech flow:  Clear and Coherent   Thought content:   Appropriate to Mood and Circumstances   Preoccupation:   None   Hallucinations:   None   Organization:  No data recorded  [employer] of Knowledge:   Fair   Intelligence:   Average   Abstraction:   Normal   Judgement:   Fair; Normal   Reality Testing:   Adequate   Insight:   Fair   Decision Making:   Normal   Social Functioning  Social Maturity:   Responsible   Social Judgement:   Normal; Heedless   Stress  Stressors:   Other (Comment)   Coping Ability:   Normal   Skill Deficits:   None   Supports:   Family; Friends/Service system      Religion: Religion/Spirituality Are You A Religious Person?: No  Leisure/Recreation: Leisure / Recreation Do You Have Hobbies?: No  Exercise/Diet: Exercise/Diet Do You Exercise?: No Have You Gained or Lost A Significant Amount of Weight in the Past Six Months?: No Do You Follow a Special Diet?: No Do You Have Any Trouble Sleeping?: No   CCA Employment/Education Employment/Work Situation: Employment / Work Situation Employment Situation: On disability Why is Patient on Disability: Mental Health Patient's Job has Been Impacted by Current Illness: No Has Patient ever Been in the [employer]?: No  Education: Education Is Patient Currently Attending School?: No Did You Product manager?: No Did You Have An Individualized Education Program (IIEP): No Did You Have Any Difficulty At School?: No Patient's Education Has Been Impacted by Current Illness: No  CCA Family/Childhood History Family and Relationship History: Family history Marital status: Single Does patient have children?: No  Childhood History:  Childhood History By whom was/is the patient raised?: Mother Did patient suffer any verbal/emotional/physical/sexual abuse as a child?: No Did patient suffer from severe childhood neglect?: No Has patient ever been sexually abused/assaulted/raped as an adolescent or adult?: No Was the patient ever a victim of a crime or a disaster?: No Witnessed domestic violence?: No Has patient been affected by domestic violence as an adult?: No  Child/Adolescent Assessment:     CCA Substance Use Alcohol/Drug Use: Alcohol / Drug Use Pain Medications: See PTA Prescriptions: See PTA Over the Counter: See PTA History of alcohol / drug use?: No history of alcohol / drug abuse Longest period of sobriety (when/how long): n/a    ASAM's:  Six Dimensions of Multidimensional Assessment  Dimension 1:  Acute Intoxication and/or Withdrawal Potential:      Dimension 2:  Biomedical  Conditions and Complications:      Dimension 3:  Emotional, Behavioral, or Cognitive Conditions and Complications:     Dimension 4:  Readiness to Change:     Dimension 5:  Relapse, Continued use, or Continued Problem Potential:     Dimension 6:  Recovery/Living Environment:     ASAM Severity Score:    ASAM Recommended Level of Treatment:     Substance use Disorder (SUD)    Recommendations for Services/Supports/Treatments:    Discharge Disposition:    DSM5 Diagnoses: Patient Active Problem List   Diagnosis Date Noted   Hypertension 03/06/2018   Schizoaffective disorder, bipolar type (HCC) 12/30/2017     Referrals to Alternative Service(s): Referred to Alternative Service(s):   Place:   Date:   Time:    Referred to Alternative Service(s):   Place:   Date:   Time:    Referred to Alternative Service(s):   Place:   Date:   Time:    Referred to Alternative Service(s):   Place:   Date:   Time:      Lilyan Gilford MS, LCAS, [facility], [facility] Therapeutic Triage Specialist 04/26/2022 3:30 PM

## 2022-04-26 NOTE — ED Triage Notes (Addendum)
PMH schizophrenic non complaint with meds- [place] RN was with patient for assessment and patient was speaking aggressively and confused and incomprehensible speech.   Pt was IVC'd by [employer] staff.   Black shoes Black and orange socks Grey pants  Green shirt  Blue lighter

## 2022-04-26 NOTE — Consult Note (Signed)
[facility] Consult   Reason for Consult: Consult for 26 year old man with a history of schizophrenia brought in under IVC by his ACT team Referring Physician: Archie Balboa Patient Identification: [name] [name] MRN:  [id number] Principal Diagnosis: Schizoaffective disorder, bipolar type ([place]) Diagnosis:  Principal Problem:   Schizoaffective disorder, bipolar type ([place]) Active Problems:   Hypertension   Total Time spent with patient: 45 minutes  Subjective:   [name] [name] is a 26 y.o. male patient admitted with "I tried to kiss my psychiatrist".  HPI: Patient seen and chart reviewed.  Patient well known from previous encounters.  26 year old man with schizophrenia or schizoaffective disorder was just discharged from the inpatient unit a couple days ago.  ACT team came to visit him and reported that he was showing clear signs of hallucinating that he was agitated that he was making no sense and that at 1 point he attempted to kiss one of the team members and had to be restrained from doing so.  Patient admits to having done this.  As usual he has little insight.  He denies that he is having hallucinations.  Says that he has been compliant with medicine.  Denies substance use.  Drug screen negative.  Past Psychiatric History: Past history of schizophrenia or schizoaffective disorder.  Although he is usually very withdrawn in the hospital his behavior outside the hospital seems to be getting worse  Risk to Self:   Risk to Others:   Prior Inpatient Therapy:   Prior Outpatient Therapy:    Past Medical History:  Past Medical History:  Diagnosis Date   Asthma    Depression    Psychosis ([place])    Schizoaffective disorder ([place])     Past Surgical History:  Procedure Laterality Date   BACK SURGERY     I&D groin  2017   Family History: No family history on file. Family Psychiatric  History: See previous Social History:  Social History   Substance and Sexual Activity   Alcohol Use No     Social History   Substance and Sexual Activity  Drug Use Never    Social History   Socioeconomic History   Marital status: Single    Spouse name: Not on file   Number of children: Not on file   Years of education: Not on file   Highest education level: Not on file  Occupational History   Not on file  Tobacco Use   Smoking status: Every Day    Packs/day: 0.50    Types: Cigarettes   Smokeless tobacco: Never  Vaping Use   Vaping Use: Some days  Substance and Sexual Activity   Alcohol use: No   Drug use: Never   Sexual activity: Not Currently  Other Topics Concern   Not on file  Social History Narrative   Not on file   Social Determinants of Health   Financial Resource Strain: Not on file  Food Insecurity: No Food Insecurity (04/17/2022)   Hunger Vital Sign    Worried About Running Out of Food in the Last Year: Never true    Ran Out of Food in the Last Year: Never true  Transportation Needs: No Transportation Needs (04/17/2022)   PRAPARE - Hydrologist (Medical): No    Lack of Transportation (Non-Medical): No  Physical Activity: Not on file  Stress: Not on file  Social Connections: Not on file   Additional Social History:    Allergies:  No Known Allergies  Labs:  Results for orders placed or performed during the hospital encounter of 04/26/22 (from the past 48 hour(s))  Comprehensive metabolic panel     Status: None   Collection Time: 04/26/22 12:21 PM  Result Value Ref Range   Sodium 140 135 - 145 mmol/L   Potassium 3.9 3.5 - 5.1 mmol/L   Chloride 110 98 - 111 mmol/L   CO2 25 22 - 32 mmol/L   Glucose, Bld 97 70 - 99 mg/dL    Comment: Glucose reference range applies only to samples taken after fasting for at least 8 hours.   BUN 18 6 - 20 mg/dL   Creatinine, Ser 0.93 0.61 - 1.24 mg/dL   Calcium 8.9 8.9 - 26.7 mg/dL   Total Protein 7.5 6.5 - 8.1 g/dL   Albumin 4.2 3.5 - 5.0 g/dL   AST 20 15 - 41 U/L   ALT  23 0 - 44 U/L   Alkaline Phosphatase 76 38 - 126 U/L   Total Bilirubin 0.5 0.3 - 1.2 mg/dL   GFR, Estimated >12 >45 mL/min    Comment: (NOTE) Calculated using the CKD-EPI Creatinine Equation (2021)    Anion gap 5 5 - 15    Comment: Performed at [facility], [address] [place], [place] [address]  CBC with Differential     Status: None   Collection Time: 04/26/22 12:21 PM  Result Value Ref Range   WBC 5.9 4.0 - 10.5 K/uL   RBC 5.36 4.22 - 5.81 MIL/uL   Hemoglobin 14.7 13.0 - 17.0 g/dL   HCT 33.8 25.0 - 53.9 %   MCV 83.2 80.0 - 100.0 fL   MCH 27.4 26.0 - 34.0 pg   MCHC 33.0 30.0 - 36.0 g/dL   RDW 76.7 34.1 - 93.7 %   Platelets 240 150 - 400 K/uL   nRBC 0.0 0.0 - 0.2 %   Neutrophils Relative % 65 %   Neutro Abs 3.9 1.7 - 7.7 K/uL   Lymphocytes Relative 23 %   Lymphs Abs 1.3 0.7 - 4.0 K/uL   Monocytes Relative 11 %   Monocytes Absolute 0.6 0.1 - 1.0 K/uL   Eosinophils Relative 0 %   Eosinophils Absolute 0.0 0.0 - 0.5 K/uL   Basophils Relative 0 %   Basophils Absolute 0.0 0.0 - 0.1 K/uL   Immature Granulocytes 1 %   Abs Immature Granulocytes 0.03 0.00 - 0.07 K/uL    Comment: Performed at [facility], [address] [place], [place] [address]  Ethanol     Status: None   Collection Time: 04/26/22 12:21 PM  Result Value Ref Range   Alcohol, Ethyl (B) <10 <10 mg/dL    Comment: (NOTE) Lowest detectable limit for serum alcohol is 10 mg/dL.  For medical purposes only. Performed at [facility], [address] [place], [place] [address]   Salicylate level     Status: Abnormal   Collection Time: 04/26/22 12:21 PM  Result Value Ref Range   Salicylate Lvl <7.0 (L) 7.0 - 30.0 mg/dL    Comment: Performed at [facility] [place], [address] [place], [place] [address]  Acetaminophen level     Status: Abnormal   Collection Time: 04/26/22 12:21 PM  Result Value Ref Range   Acetaminophen (Tylenol), Serum <10 (L) 10 - 30 ug/mL    Comment:  (NOTE) Therapeutic concentrations vary significantly. A range of 10-30 ug/mL  may be an effective concentration for many patients. However, some  are best  treated at concentrations outside of this range. Acetaminophen concentrations >150 ug/mL at 4 hours after ingestion  and >50 ug/mL at 12 hours after ingestion are often associated with  toxic reactions.  Performed at [facility], [address] [place], [place] [address]   Urinalysis, Routine w reflex microscopic     Status: Abnormal   Collection Time: 04/26/22 12:23 PM  Result Value Ref Range   Color, Urine YELLOW (A) YELLOW   APPearance CLEAR (A) CLEAR   Specific Gravity, Urine 1.024 1.005 - 1.030   pH 7.0 5.0 - 8.0   Glucose, UA NEGATIVE NEGATIVE mg/dL   Hgb urine dipstick NEGATIVE NEGATIVE   Bilirubin Urine NEGATIVE NEGATIVE   Ketones, ur NEGATIVE NEGATIVE mg/dL   Protein, ur NEGATIVE NEGATIVE mg/dL   Nitrite NEGATIVE NEGATIVE   Leukocytes,Ua NEGATIVE NEGATIVE    Comment: Performed at [facility], [address], [place], [place] [address]  Urine Drug Screen, Qualitative ([facility] only)     Status: None   Collection Time: 04/26/22 12:56 PM  Result Value Ref Range   Tricyclic, Ur Screen NONE DETECTED NONE DETECTED   Amphetamines, Ur Screen NONE DETECTED NONE DETECTED   MDMA (Ecstasy)Ur Screen NONE DETECTED NONE DETECTED   Cocaine Metabolite,Ur [place] NONE DETECTED NONE DETECTED   Opiate, Ur Screen NONE DETECTED NONE DETECTED   Phencyclidine (PCP) Ur S NONE DETECTED NONE DETECTED   Cannabinoid 50 Ng, Ur [place] NONE DETECTED NONE DETECTED   Barbiturates, Ur Screen NONE DETECTED NONE DETECTED   Benzodiazepine, Ur Scrn NONE DETECTED NONE DETECTED   Methadone Scn, Ur NONE DETECTED NONE DETECTED    Comment: (NOTE) Tricyclics + metabolites, urine    Cutoff 1000 ng/mL Amphetamines + metabolites, urine  Cutoff 1000 ng/mL MDMA (Ecstasy), urine              Cutoff 500 ng/mL Cocaine Metabolite, urine          Cutoff 300  ng/mL Opiate + metabolites, urine        Cutoff 300 ng/mL Phencyclidine (PCP), urine         Cutoff 25 ng/mL Cannabinoid, urine                 Cutoff 50 ng/mL Barbiturates + metabolites, urine  Cutoff 200 ng/mL Benzodiazepine, urine              Cutoff 200 ng/mL Methadone, urine                   Cutoff 300 ng/mL  The urine drug screen provides only a preliminary, unconfirmed analytical test result and should not be used for non-medical purposes. Clinical consideration and professional judgment should be applied to any positive drug screen result due to possible interfering substances. A more specific alternate chemical method must be used in order to obtain a confirmed analytical result. Gas chromatography / mass spectrometry (GC/MS) is the preferred confirm atory method. Performed at [facility], [address] [place], [place] [address]   Resp Panel by RT-PCR (Flu A&B, Covid) Anterior Nasal Swab     Status: None   Collection Time: 04/26/22  1:08 PM   Specimen: Anterior Nasal Swab  Result Value Ref Range   SARS Coronavirus 2 by RT PCR NEGATIVE NEGATIVE    Comment: (NOTE) SARS-CoV-2 target nucleic acids are NOT DETECTED.  The SARS-CoV-2 RNA is generally detectable in upper respiratory specimens during the acute phase of infection. The lowest concentration of SARS-CoV-2 viral copies this assay can  detect is 138 copies/mL. A negative result does not preclude SARS-Cov-2 infection and should not be used as the sole basis for treatment or other patient management decisions. A negative result may occur with  improper specimen collection/handling, submission of specimen other than nasopharyngeal swab, presence of viral mutation(s) within the areas targeted by this assay, and inadequate number of viral copies(<138 copies/mL). A negative result must be combined with clinical observations, patient history, and epidemiological information. The expected result is  Negative.  Fact Sheet for Patients:  BloggerCourse.com  Fact Sheet for Healthcare Providers:  SeriousBroker.it  This test is no t yet approved or cleared by the [place] FDA and  has been authorized for detection and/or diagnosis of SARS-CoV-2 by FDA under an Emergency Use Authorization (EUA). This EUA will remain  in effect (meaning this test can be used) for the duration of the COVID-19 declaration under Section 564(b)(1) of the Act, 21 U.S.C.section 360bbb-3(b)(1), unless the authorization is terminated  or revoked sooner.       Influenza A by PCR NEGATIVE NEGATIVE   Influenza B by PCR NEGATIVE NEGATIVE    Comment: (NOTE) The Xpert Xpress SARS-CoV-2/FLU/RSV plus assay is intended as an aid in the diagnosis of influenza from Nasopharyngeal swab specimens and should not be used as a sole basis for treatment. Nasal washings and aspirates are unacceptable for Xpert Xpress SARS-CoV-2/FLU/RSV testing.  Fact Sheet for Patients: BloggerCourse.com  Fact Sheet for Healthcare Providers: SeriousBroker.it  This test is not yet approved or cleared by the [place] FDA and has been authorized for detection and/or diagnosis of SARS-CoV-2 by FDA under an Emergency Use Authorization (EUA). This EUA will remain in effect (meaning this test can be used) for the duration of the COVID-19 declaration under Section 564(b)(1) of the Act, 21 U.S.C. section 360bbb-3(b)(1), unless the authorization is terminated or revoked.  Performed at [facility], [address] [place], [place] [address]     No current facility-administered medications for this encounter.   Current Outpatient Medications  Medication Sig Dispense Refill   amLODipine (NORVASC) 10 MG tablet Take 1 tablet (10 mg total) by mouth daily. 30 tablet 1   cloZAPine (CLOZARIL) 100 MG tablet Take 1 tablet (100 mg  total) by mouth at bedtime. 30 tablet 1   divalproex (DEPAKOTE ER) 500 MG 24 hr tablet Take 1 tablet (500 mg total) by mouth 2 (two) times daily. 60 tablet 1   [START ON 05/06/2022] paliperidone (INVEGA SUSTENNA) 234 MG/1.5ML injection Inject 234 mg into the muscle every 28 (twenty-eight) days. 1.5 mL 1   paliperidone (INVEGA) 6 MG 24 hr tablet Take 1 tablet (6 mg total) by mouth daily. 30 tablet 1   traZODone (DESYREL) 100 MG tablet Take 1 tablet (100 mg total) by mouth at bedtime. 30 tablet 1    Musculoskeletal: Strength & Muscle Tone: within normal limits Gait & Station: normal Patient leans: N/A            Psychiatric Specialty Exam:  Presentation  General Appearance:  Casual; Well Groomed  Eye Contact: Good  Speech: Slow  Speech Volume: Decreased  Handedness: Right   Mood and Affect  Mood: Anxious  Affect: Inappropriate; Constricted   Thought Process  Thought Processes: Disorganized  Descriptions of Associations:Circumstantial  Orientation:Full (Time, Place and Person)  Thought Content:Paranoid Ideation; Scattered; Illogical  History of Schizophrenia/Schizoaffective disorder:Yes  Duration of Psychotic Symptoms:Greater than six months  Hallucinations:No data recorded Ideas of Reference:Paranoia; Delusions  Suicidal Thoughts:No data recorded Homicidal  Thoughts:No data recorded  Sensorium  Memory: Immediate Poor; Recent Poor; Remote Poor  Judgment: Poor  Insight: Poor   Executive Functions  Concentration: Fair  Attention Span: Poor  Recall: Poor  Fund of Knowledge: Poor  Language: Poor   Psychomotor Activity  Psychomotor Activity:No data recorded  Assets  Assets: Communication Skills; Desire for Improvement; Resilience; Social Support   Sleep  Sleep:No data recorded  Physical Exam: Physical Exam Vitals and nursing note reviewed.  Constitutional:      Appearance: Normal appearance.  HENT:     Head:  Normocephalic and atraumatic.     Mouth/Throat:     Pharynx: Oropharynx is clear.  Eyes:     Pupils: Pupils are equal, round, and reactive to light.  Cardiovascular:     Rate and Rhythm: Normal rate and regular rhythm.  Pulmonary:     Effort: Pulmonary effort is normal.     Breath sounds: Normal breath sounds.  Abdominal:     General: Abdomen is flat.     Palpations: Abdomen is soft.  Musculoskeletal:        General: Normal range of motion.  Skin:    General: Skin is warm and dry.  Neurological:     General: No focal deficit present.     Mental Status: He is alert. Mental status is at baseline.  Psychiatric:        Attention and Perception: He is inattentive.        Mood and Affect: Mood normal.        Speech: Speech is delayed.        Behavior: Behavior is slowed.        Thought Content: Thought content normal.        Cognition and Memory: Memory is impaired.    Review of Systems  Constitutional: Negative.   HENT: Negative.    Eyes: Negative.   Respiratory: Negative.    Cardiovascular: Negative.   Gastrointestinal: Negative.   Musculoskeletal: Negative.   Skin: Negative.   Neurological: Negative.   Psychiatric/Behavioral: Negative.     Blood pressure 129/85, pulse 100, temperature 97.9 F (36.6 C), temperature source Oral, resp. rate 18, weight 77.1 kg, SpO2 93 %. Body mass index is 21.25 kg/m.  Treatment Plan Summary: Medication management and Plan after reviewing commitment and the details and that it seems like it is clear that outside the hospital he continues to suffer from psychotic symptoms with accompanying agitation and behavior problems.  Agree that it would do well to admit him to the hospital again if only for a few days for stabilization.  [place] IVC and orders will be placed as he is reviewed with nursing for admission to the psych unit  Disposition: Recommend psychiatric Inpatient admission when medically cleared.  Mordecai RasmussenJohn Lillionna Nabi, MD 04/26/2022 4:40  PM

## 2022-04-27 ENCOUNTER — Encounter: Payer: Self-pay | Admitting: Psychiatry

## 2022-04-27 ENCOUNTER — Inpatient Hospital Stay
Admission: AD | Admit: 2022-04-27 | Discharge: 2022-05-03 | DRG: 885 | Disposition: A | Discharge status: Home / Self Care | Payer: No Typology Code available for payment source | Source: Intra-hospital | Attending: Psychiatry | Admitting: Psychiatry

## 2022-04-27 ENCOUNTER — Other Ambulatory Visit: Payer: Self-pay

## 2022-04-27 DIAGNOSIS — I10 Essential (primary) hypertension: Secondary | ICD-10-CM | POA: Diagnosis present

## 2022-04-27 DIAGNOSIS — F419 Anxiety disorder, unspecified: Secondary | ICD-10-CM | POA: Diagnosis present

## 2022-04-27 DIAGNOSIS — Z653 Problems related to other legal circumstances: Secondary | ICD-10-CM

## 2022-04-27 DIAGNOSIS — R4587 Impulsiveness: Secondary | ICD-10-CM | POA: Diagnosis present

## 2022-04-27 DIAGNOSIS — J45909 Unspecified asthma, uncomplicated: Secondary | ICD-10-CM | POA: Diagnosis present

## 2022-04-27 DIAGNOSIS — Z91199 Patient's noncompliance with other medical treatment and regimen due to unspecified reason: Secondary | ICD-10-CM

## 2022-04-27 DIAGNOSIS — Z1152 Encounter for screening for COVID-19: Secondary | ICD-10-CM

## 2022-04-27 DIAGNOSIS — F203 Undifferentiated schizophrenia: Secondary | ICD-10-CM | POA: Diagnosis not present

## 2022-04-27 DIAGNOSIS — Z79899 Other long term (current) drug therapy: Secondary | ICD-10-CM

## 2022-04-27 DIAGNOSIS — F17210 Nicotine dependence, cigarettes, uncomplicated: Secondary | ICD-10-CM | POA: Diagnosis present

## 2022-04-27 MED ORDER — PALIPERIDONE PALMITATE ER 234 MG/1.5ML IM SUSY: 234.0000 mg | PREFILLED_SYRINGE | INTRAMUSCULAR | Status: DC

## 2022-04-27 MED ORDER — AMLODIPINE BESYLATE 5 MG PO TABS
10.0000 mg | ORAL_TABLET | Freq: Every day | ORAL | Status: DC
  Administered 2022-04-27 – 2022-05-03 (×7): 10 mg via ORAL
  Filled 2022-04-27 (×7): qty 2

## 2022-04-27 MED ORDER — DIVALPROEX SODIUM ER 500 MG PO TB24
500.0000 mg | ORAL_TABLET | Freq: Two times a day (BID) | ORAL | Status: DC
  Administered 2022-04-27 – 2022-05-03 (×12): 500 mg via ORAL
  Filled 2022-04-27 (×12): qty 1

## 2022-04-27 MED ORDER — CLOZAPINE 100 MG PO TABS
100.0000 mg | ORAL_TABLET | Freq: Every day | ORAL | Status: DC
  Administered 2022-04-27 – 2022-04-28 (×2): 100 mg via ORAL
  Filled 2022-04-27 (×2): qty 1

## 2022-04-27 MED ORDER — ALUM & MAG HYDROXIDE-SIMETH 200-200-20 MG/5ML PO SUSP: 30.0000 mL | ORAL | Status: DC | PRN

## 2022-04-27 MED ORDER — MAGNESIUM HYDROXIDE 400 MG/5ML PO SUSP: 30.0000 mL | Freq: Every day | ORAL | Status: DC | PRN

## 2022-04-27 MED ORDER — LORAZEPAM 1 MG PO TABS
1.0000 mg | ORAL_TABLET | Freq: Four times a day (QID) | ORAL | Status: DC | PRN
  Administered 2022-04-27: 1 mg via ORAL
  Filled 2022-04-27: qty 1

## 2022-04-27 MED ORDER — TRAZODONE HCL 100 MG PO TABS
100.0000 mg | ORAL_TABLET | Freq: Every day | ORAL | Status: DC
  Administered 2022-04-27 – 2022-05-02 (×6): 100 mg via ORAL
  Filled 2022-04-27 (×6): qty 1

## 2022-04-27 MED ORDER — HYDROXYZINE HCL 50 MG PO TABS
50.0000 mg | ORAL_TABLET | Freq: Three times a day (TID) | ORAL | Status: DC | PRN
  Administered 2022-04-30 – 2022-05-01 (×2): 50 mg via ORAL
  Filled 2022-04-27 (×2): qty 1

## 2022-04-27 MED ORDER — PALIPERIDONE ER 3 MG PO TB24
6.0000 mg | ORAL_TABLET | Freq: Every day | ORAL | Status: DC
  Administered 2022-04-27 – 2022-05-03 (×7): 6 mg via ORAL
  Filled 2022-04-27 (×7): qty 2

## 2022-04-27 MED ORDER — ACETAMINOPHEN 325 MG PO TABS: 650.0000 mg | ORAL_TABLET | Freq: Four times a day (QID) | ORAL | Status: DC | PRN

## 2022-04-27 NOTE — Plan of Care (Signed)
Patient was admitted to [facility] today via ED.  On admission pt was calm and cooperative, soft spoken, no evidence of agitation.  Pt participated in admission process with no resistance to questions or procedures.  Pt. Went outside with unit and then returned to his room.  Pt has been calm, cooperative and compliant with medications during the shift.  Pt. Denies SI, HI, AVH.  Pt has poor insight into his need for admission.  When asked about triggers and goals Pt said "I don't know."  Pt says he has a good appetite and sleeps well.  Pt denies recent falls.  Pt says his mom is a source of support and says that he lives with his mom.  Pt denies pain or any physical concerns.

## 2022-04-27 NOTE — ED Notes (Signed)
Pt is up pacing the unit and trying to open doors to the sally port and slamming doors shut. Will make MD aware to order PRN meds for agitation.

## 2022-04-27 NOTE — ED Notes (Signed)
IVC/pending inpatient psych admission when medically cleared 

## 2022-04-27 NOTE — Progress Notes (Signed)
Pt is calm and cooperative. Denies SI/HI. Compliant to medications. Denies pain. Patient has taken his night medication. Patient went to his bed after took his night medication.No other issues. Patient is now sleeping on his bed with eyes closed. Q15 checks are maintained.

## 2022-04-27 NOTE — ED Notes (Addendum)
Pt is under IVC being transferred to BMU with security. No signature pad available. Verbal consent for transfer.

## 2022-04-27 NOTE — ED Notes (Addendum)
Lunch tray given,.

## 2022-04-27 NOTE — Progress Notes (Signed)
Pharmacy Consult - Clozapine     26 yo male ordered clozapine 100 mg PO HS  This patient's order has been reviewed for prescribing contraindications.    Clozapine REMS enrollment Verified: Yes DATE 04/27/22 REMS patient ID: [id number] REMS Dispense Authorization (RDA): [id number] Current Outpatient Monitoring: Weekly    Home Regimen: 100 mg PO HS Last dose: Unknown exact last dose. Last charted dose in our system was 04/21/22 and patient received 75 mg. Patient returned to ED 10/6   Dose Adjustments This Admission: N/A   Labs: Date    [place]    Submitted? 10/6 3900 Yes      Plan: Continue clozapine 75 mg HS Monitor ANC at least weekly while inpatient

## 2022-04-27 NOTE — ED Notes (Signed)
Report called to BMU.

## 2022-04-27 NOTE — Progress Notes (Signed)
Pt approached nurses station and stated "I want to see the doctor, I want to go home."  Pt was informed that doctor would visit the unit tomorrow.  Pt had no further complaints.

## 2022-04-27 NOTE — ED Provider Notes (Signed)
-----------------------------------------   1:55 PM on 04/27/2022 -----------------------------------------   Blood pressure 97/77, pulse 91, temperature 97.9 F (36.6 C), temperature source Oral, resp. rate 16, weight 77.1 kg, SpO2 96 %.  The patient is calm and cooperative at this time.  Earlier the patient was somewhat agitated and pacing around the room but responded well to p.o. Ativan.  He is planned for admission to the behavioral unit and will be moved there imminently.   Arta Silence, MD 04/27/22 1356

## 2022-04-28 ENCOUNTER — Encounter: Payer: Self-pay | Admitting: Psychiatry

## 2022-04-28 DIAGNOSIS — F203 Undifferentiated schizophrenia: Secondary | ICD-10-CM | POA: Diagnosis not present

## 2022-04-28 NOTE — H&P (Signed)
Psychiatric Admission Assessment Adult  Patient Identification: [name] [name] MRN:  [id number] Date of Evaluation:  04/28/2022 Chief Complaint:  Schizophrenia, undifferentiated (HCC) [F20.3] Principal Diagnosis: Schizophrenia, undifferentiated (HCC) Diagnosis:  Principal Problem:   Schizophrenia, undifferentiated (HCC) Active Problems:   Hypertension  History of Present Illness: Patient seen and chart reviewed.  26 year old man with a history of schizophrenia brought back to the hospital after inappropriate interaction with his ACT team.  Patient is staying in bed today not interacting much.  He was awake and told me he had no complaints.  Denied hallucinations denied suicidal or homicidal thought.  Patient had just been discharged from the hospital and seems to have had multiple recurrent hospital stays lately Associated Signs/Symptoms: Depression Symptoms:  psychomotor retardation, anxiety, Duration of Depression Symptoms: N/A  (Hypo) Manic Symptoms:  Irritable Mood, Labiality of Mood, Anxiety Symptoms:  Excessive Worry, Psychotic Symptoms:  Paranoia, PTSD Symptoms: Negative Total Time spent with patient: 1 hour  Past Psychiatric History: Past history of schizophrenia responded well previously to Clozapine  Is the patient at risk to self? Yes.    Has the patient been a risk to self in the past 6 months? Yes.    Has the patient been a risk to self within the distant past? Yes.    Is the patient a risk to others? Yes.    Has the patient been a risk to others in the past 6 months? Yes.    Has the patient been a risk to others within the distant past? Yes.     [place] Scale:  Flowsheet Row Admission (Current) from 04/27/2022 in [facility]IENT [facility] MEDICINE ED from 04/26/2022 in [facility] Admission (Discharged) from 04/16/2022 in [facility] INPATIENT BEHAV[facility]AL MEDICINE  C-SSRS RISK CATEGORY No Risk No Risk No Risk        Prior  Inpatient Therapy:   Prior Outpatient Therapy:    Alcohol Screening: 1. How often do you have a drink containing alcohol?: Never 2. How many drinks containing alcohol do you have on a typical day when you are drinking?: 1 or 2 3. How often do you have six or more drinks on one occasion?: Never AUDIT-C Score: 0 4. How often during the last year have you found that you were not able to stop drinking once you had started?: Never 5. How often during the last year have you failed to do what was normally expected from you because of drinking?: Never 6. How often during the last year have you needed a first drink in the morning to get yourself going after a heavy drinking session?: Never 7. How often during the last year have you had a feeling of guilt of remorse after drinking?: Never 8. How often during the last year have you been unable to remember what happened the night before because you had been drinking?: Never 9. Have you or someone else been injured as a result of your drinking?: No 10. Has a relative or friend or a doctor or another health worker been concerned about your drinking or suggested you cut down?: No Alcohol Use Disorder Identification Test Final Score (AUDIT): 0 Alcohol Brief Interventions/Follow-up: Alcohol education/Brief advice Substance Abuse History in the last 12 months:  No. Consequences of Substance Abuse: Negative Previous Psychotropic Medications: Yes  Psychological Evaluations: Yes  Past Medical History:  Past Medical History:  Diagnosis Date   Asthma    Depression    Psychosis (HCC)    Schizoaffective disorder (HCC)  Past Surgical History:  Procedure Laterality Date   BACK SURGERY     I&D groin  2017   Family History: History reviewed. No pertinent family history. Family Psychiatric  History: None reported Tobacco Screening:   Social History:  Social History   Substance and Sexual Activity  Alcohol Use No     Social History   Substance and  Sexual Activity  Drug Use Never    Additional Social History:                           Allergies:  No Known Allergies Lab Results:  Results for orders placed or performed during the hospital encounter of 04/26/22 (from the past 48 hour(s))  Comprehensive metabolic panel     Status: None   Collection Time: 04/26/22 12:21 PM  Result Value Ref Range   Sodium 140 135 - 145 mmol/L   Potassium 3.9 3.5 - 5.1 mmol/L   Chloride 110 98 - 111 mmol/L   CO2 25 22 - 32 mmol/L   Glucose, Bld 97 70 - 99 mg/dL    Comment: Glucose reference range applies only to samples taken after fasting for at least 8 hours.   BUN 18 6 - 20 mg/dL   Creatinine, Ser 7.82 0.61 - 1.24 mg/dL   Calcium 8.9 8.9 - 42.3 mg/dL   Total Protein 7.5 6.5 - 8.1 g/dL   Albumin 4.2 3.5 - 5.0 g/dL   AST 20 15 - 41 U/L   ALT 23 0 - 44 U/L   Alkaline Phosphatase 76 38 - 126 U/L   Total Bilirubin 0.5 0.3 - 1.2 mg/dL   GFR, Estimated >53 >61 mL/min    Comment: (NOTE) Calculated using the CKD-EPI Creatinine Equation (2021)    Anion gap 5 5 - 15    Comment: Performed at [facility], [address] [place], [place] [address]  CBC with Differential     Status: None   Collection Time: 04/26/22 12:21 PM  Result Value Ref Range   WBC 5.9 4.0 - 10.5 K/uL   RBC 5.36 4.22 - 5.81 MIL/uL   Hemoglobin 14.7 13.0 - 17.0 g/dL   HCT 40.0 86.7 - 61.9 %   MCV 83.2 80.0 - 100.0 fL   MCH 27.4 26.0 - 34.0 pg   MCHC 33.0 30.0 - 36.0 g/dL   RDW 50.9 32.6 - 71.2 %   Platelets 240 150 - 400 K/uL   nRBC 0.0 0.0 - 0.2 %   Neutrophils Relative % 65 %   Neutro Abs 3.9 1.7 - 7.7 K/uL   Lymphocytes Relative 23 %   Lymphs Abs 1.3 0.7 - 4.0 K/uL   Monocytes Relative 11 %   Monocytes Absolute 0.6 0.1 - 1.0 K/uL   Eosinophils Relative 0 %   Eosinophils Absolute 0.0 0.0 - 0.5 K/uL   Basophils Relative 0 %   Basophils Absolute 0.0 0.0 - 0.1 K/uL   Immature Granulocytes 1 %   Abs Immature Granulocytes 0.03 0.00 - 0.07 K/uL     Comment: Performed at [facility], [address] [place], [place] [address]  Ethanol     Status: None   Collection Time: 04/26/22 12:21 PM  Result Value Ref Range   Alcohol, Ethyl (B) <10 <10 mg/dL    Comment: (NOTE) Lowest detectable limit for serum alcohol is 10 mg/dL.  For medical purposes only. Performed at [facility], [address], [place], [place] [address]  Salicylate level     Status: Abnormal   Collection Time: 04/26/22 12:21 PM  Result Value Ref Range   Salicylate Lvl <7.9 (L) 7.0 - 30.0 mg/dL    Comment: Performed at [facility], [address] [place], [place] [address]  Acetaminophen level     Status: Abnormal   Collection Time: 04/26/22 12:21 PM  Result Value Ref Range   Acetaminophen (Tylenol), Serum <10 (L) 10 - 30 ug/mL    Comment: (NOTE) Therapeutic concentrations vary significantly. A range of 10-30 ug/mL  may be an effective concentration for many patients. However, some  are best treated at concentrations outside of this range. Acetaminophen concentrations >150 ug/mL at 4 hours after ingestion  and >50 ug/mL at 12 hours after ingestion are often associated with  toxic reactions.  Performed at [facility], [address] [place], [place] [address]   Urinalysis, Routine w reflex microscopic     Status: Abnormal   Collection Time: 04/26/22 12:23 PM  Result Value Ref Range   Color, Urine YELLOW (A) YELLOW   APPearance CLEAR (A) CLEAR   Specific Gravity, Urine 1.024 1.005 - 1.030   pH 7.0 5.0 - 8.0   Glucose, UA NEGATIVE NEGATIVE mg/dL   Hgb urine dipstick NEGATIVE NEGATIVE   Bilirubin Urine NEGATIVE NEGATIVE   Ketones, ur NEGATIVE NEGATIVE mg/dL   Protein, ur NEGATIVE NEGATIVE mg/dL   Nitrite NEGATIVE NEGATIVE   Leukocytes,Ua NEGATIVE NEGATIVE    Comment: Performed at [facility], [address] [place], [place] [address]  Urine Drug Screen, Qualitative ([facility] only)     Status: None    Collection Time: 04/26/22 12:56 PM  Result Value Ref Range   Tricyclic, Ur Screen NONE DETECTED NONE DETECTED   Amphetamines, Ur Screen NONE DETECTED NONE DETECTED   MDMA (Ecstasy)Ur Screen NONE DETECTED NONE DETECTED   Cocaine Metabolite,Ur [place] NONE DETECTED NONE DETECTED   Opiate, Ur Screen NONE DETECTED NONE DETECTED   Phencyclidine (PCP) Ur S NONE DETECTED NONE DETECTED   Cannabinoid 50 Ng, Ur [place] NONE DETECTED NONE DETECTED   Barbiturates, Ur Screen NONE DETECTED NONE DETECTED   Benzodiazepine, Ur Scrn NONE DETECTED NONE DETECTED   Methadone Scn, Ur NONE DETECTED NONE DETECTED    Comment: (NOTE) Tricyclics + metabolites, urine    Cutoff 1000 ng/mL Amphetamines + metabolites, urine  Cutoff 1000 ng/mL MDMA (Ecstasy), urine              Cutoff 500 ng/mL Cocaine Metabolite, urine          Cutoff 300 ng/mL Opiate + metabolites, urine        Cutoff 300 ng/mL Phencyclidine (PCP), urine         Cutoff 25 ng/mL Cannabinoid, urine                 Cutoff 50 ng/mL Barbiturates + metabolites, urine  Cutoff 200 ng/mL Benzodiazepine, urine              Cutoff 200 ng/mL Methadone, urine                   Cutoff 300 ng/mL  The urine drug screen provides only a preliminary, unconfirmed analytical test result and should not be used for non-medical purposes. Clinical consideration and professional judgment should be applied to any positive drug screen result due to possible interfering substances. A more specific alternate chemical method must be used in order to obtain a confirmed analytical result. Gas chromatography / mass  spectrometry (GC/MS) is the preferred confirm atory method. Performed at [facility], [address] [place], [place] [address]   Resp Panel by RT-PCR (Flu A&B, Covid) Anterior Nasal Swab     Status: None   Collection Time: 04/26/22  1:08 PM   Specimen: Anterior Nasal Swab  Result Value Ref Range   SARS Coronavirus 2 by RT PCR NEGATIVE NEGATIVE    Comment:  (NOTE) SARS-CoV-2 target nucleic acids are NOT DETECTED.  The SARS-CoV-2 RNA is generally detectable in upper respiratory specimens during the acute phase of infection. The lowest concentration of SARS-CoV-2 viral copies this assay can detect is 138 copies/mL. A negative result does not preclude SARS-Cov-2 infection and should not be used as the sole basis for treatment or other patient management decisions. A negative result may occur with  improper specimen collection/handling, submission of specimen other than nasopharyngeal swab, presence of viral mutation(s) within the areas targeted by this assay, and inadequate number of viral copies(<138 copies/mL). A negative result must be combined with clinical observations, patient history, and epidemiological information. The expected result is Negative.  Fact Sheet for Patients:  BloggerCourse.com  Fact Sheet for Healthcare Providers:  SeriousBroker.it  This test is no t yet approved or cleared by the [place] FDA and  has been authorized for detection and/or diagnosis of SARS-CoV-2 by FDA under an Emergency Use Authorization (EUA). This EUA will remain  in effect (meaning this test can be used) for the duration of the COVID-19 declaration under Section 564(b)(1) of the Act, 21 U.S.C.section 360bbb-3(b)(1), unless the authorization is terminated  or revoked sooner.       Influenza A by PCR NEGATIVE NEGATIVE   Influenza B by PCR NEGATIVE NEGATIVE    Comment: (NOTE) The Xpert Xpress SARS-CoV-2/FLU/RSV plus assay is intended as an aid in the diagnosis of influenza from Nasopharyngeal swab specimens and should not be used as a sole basis for treatment. Nasal washings and aspirates are unacceptable for Xpert Xpress SARS-CoV-2/FLU/RSV testing.  Fact Sheet for Patients: BloggerCourse.com  Fact Sheet for Healthcare  Providers: SeriousBroker.it  This test is not yet approved or cleared by the [place] FDA and has been authorized for detection and/or diagnosis of SARS-CoV-2 by FDA under an Emergency Use Authorization (EUA). This EUA will remain in effect (meaning this test can be used) for the duration of the COVID-19 declaration under Section 564(b)(1) of the Act, 21 U.S.C. section 360bbb-3(b)(1), unless the authorization is terminated or revoked.  Performed at [facility], [address] [place], [place] [address]     Blood Alcohol level:  Lab Results  Component Value Date   [facility] <10 04/26/2022   ETH <10 04/12/2022    Metabolic Disorder Labs:  Lab Results  Component Value Date   HGBA1C 5.1 12/03/2021   MPG 99.67 12/03/2021   MPG 91.06 07/19/2020   No results found for: "PROLACTIN" Lab Results  Component Value Date   CHOL 197 12/06/2021   TRIG 111 12/06/2021   HDL 46 12/06/2021   CHOLHDL 4.3 12/06/2021   VLDL 22 12/06/2021   LDLCALC 129 (H) 12/06/2021   LDLCALC 177 (H) 07/19/2020    Current Medications: Current Facility-Administered Medications  Medication Dose Route Frequency Provider Last Rate Last Admin   acetaminophen (TYLENOL) tablet 650 mg  650 mg Oral Q6H PRN Libra Gatz, [name], MD       alum & mag hydroxide-simeth (MAALOX/MYLANTA) 200-200-20 MG/5ML suspension 30 mL  30 mL Oral Q4H PRN Kiante Petrovich T,  MD       amLODipine (NORVASC) tablet 10 mg  10 mg Oral Daily [name] Coven, [name], MD   10 mg at 04/28/22 0930   cloZAPine (CLOZARIL) tablet 100 mg  100 mg Oral QHS Careli Luzader T, MD   100 mg at 04/27/22 2117   divalproex (DEPAKOTE ER) 24 hr tablet 500 mg  500 mg Oral BID Emmeline Winebarger T, MD   500 mg at 04/28/22 0930   hydrOXYzine (ATARAX) tablet 50 mg  50 mg Oral TID PRN Braelen Sproule, [name], MD       magnesium hydroxide (MILK OF MAGNESIA) suspension 30 mL  30 mL Oral Daily PRN Billyjack Trompeter, [name], MD       Melene Muller[START ON 05/06/2022] paliperidone  (INVEGA SUSTENNA) injection 234 mg  234 mg Intramuscular Q28 days Hyman Crossan T, MD       paliperidone (INVEGA) 24 hr tablet 6 mg  6 mg Oral Daily Rivaldo Hineman T, MD   6 mg at 04/28/22 0930   traZODone (DESYREL) tablet 100 mg  100 mg Oral QHS Eldridge Marcott T, MD   100 mg at 04/27/22 2117   PTA Medications: Medications Prior to Admission  Medication Sig Dispense Refill Last Dose   cloZAPine (CLOZARIL) 100 MG tablet Take 1 tablet (100 mg total) by mouth at bedtime. 30 tablet 1 Past Week   [START ON 05/06/2022] paliperidone (INVEGA SUSTENNA) 234 MG/1.5ML injection Inject 234 mg into the muscle every 28 (twenty-eight) days. 1.5 mL 1 Past Week   amLODipine (NORVASC) 10 MG tablet Take 1 tablet (10 mg total) by mouth daily. 30 tablet 1 unk   divalproex (DEPAKOTE ER) 500 MG 24 hr tablet Take 1 tablet (500 mg total) by mouth 2 (two) times daily. 60 tablet 1 unk   paliperidone (INVEGA) 6 MG 24 hr tablet Take 1 tablet (6 mg total) by mouth daily. 30 tablet 1    traZODone (DESYREL) 100 MG tablet Take 1 tablet (100 mg total) by mouth at bedtime. 30 tablet 1 unk    Musculoskeletal: Strength & Muscle Tone: within normal limits Gait & Station: normal Patient leans: N/A            Psychiatric Specialty Exam:  Presentation  General Appearance:  Casual; Well Groomed  Eye Contact: Good  Speech: Slow  Speech Volume: Decreased  Handedness: Right   Mood and Affect  Mood: Anxious  Affect: Inappropriate; Constricted   Thought Process  Thought Processes: Disorganized  Duration of Psychotic Symptoms: Greater than six months  Past Diagnosis of Schizophrenia or Psychoactive disorder: Yes  Descriptions of Associations:Circumstantial  Orientation:Full (Time, Place and Person)  Thought Content:Paranoid Ideation; Scattered; Illogical  Hallucinations:No data recorded Ideas of Reference:Paranoia; Delusions  Suicidal Thoughts:No data recorded Homicidal Thoughts:No data  recorded  Sensorium  Memory: Immediate Poor; Recent Poor; Remote Poor  Judgment: Poor  Insight: Poor   Executive Functions  Concentration: Fair  Attention Span: Poor  Recall: Poor  Fund of Knowledge: Poor  Language: Poor   Psychomotor Activity  Psychomotor Activity:No data recorded  Assets  Assets: Communication Skills; Desire for Improvement; Resilience; Social Support   Sleep  Sleep:No data recorded   Physical Exam: Physical Exam Vitals and nursing note reviewed.  Constitutional:      Appearance: Normal appearance.  HENT:     Head: Normocephalic and atraumatic.     Mouth/Throat:     Pharynx: Oropharynx is clear.  Eyes:     Pupils: Pupils are equal, round, and reactive to light.  Cardiovascular:     Rate and Rhythm: Normal rate and regular rhythm.  Pulmonary:     Effort: Pulmonary effort is normal.     Breath sounds: Normal breath sounds.  Abdominal:     General: Abdomen is flat.     Palpations: Abdomen is soft.  Musculoskeletal:        General: Normal range of motion.  Skin:    General: Skin is warm and dry.  Neurological:     General: No focal deficit present.     Mental Status: He is alert. Mental status is at baseline.  Psychiatric:        Attention and Perception: He is inattentive.        Mood and Affect: Mood normal. Affect is blunt.        Speech: Speech is delayed.        Behavior: Behavior is withdrawn.        Thought Content: Thought content is paranoid.        Cognition and Memory: Memory is impaired.    Review of Systems  Constitutional: Negative.   HENT: Negative.    Eyes: Negative.   Respiratory: Negative.    Cardiovascular: Negative.   Gastrointestinal: Negative.   Musculoskeletal: Negative.   Skin: Negative.   Neurological: Negative.   Psychiatric/Behavioral: Negative.     Blood pressure 123/88, pulse 91, temperature 98.2 F (36.8 C), temperature source Oral, resp. rate 18, height 6\' 2"  (1.88 m), weight 100  kg, SpO2 100 %. Body mass index is 28.31 kg/m.  Treatment Plan Summary: Medication management and Plan continue current medications especially clozapine with a goal of trying to gradually increase the dose.  Encourage group attendance.  Monitor behavior.  Try to stay in touch with the ACT team.  Observation Level/Precautions:  15 minute checks  Laboratory:  Chemistry Profile  Psychotherapy:    Medications:    Consultations:    Discharge Concerns:    Estimated LOS:  Other:     Physician Treatment Plan for Primary Diagnosis: Schizophrenia, undifferentiated (HCC) Long Term Goal(s): Improvement in symptoms so as ready for discharge  Short Term Goals: Ability to verbalize feelings will improve, Ability to demonstrate self-control will improve, Ability to maintain clinical measurements within normal limits will improve, and Compliance with prescribed medications will improve  Physician Treatment Plan for Secondary Diagnosis: Principal Problem:   Schizophrenia, undifferentiated (HCC) Active Problems:   Hypertension  Long Term Goal(s): Improvement in symptoms so as ready for discharge  Short Term Goals: Ability to verbalize feelings will improve, Ability to demonstrate self-control will improve, Ability to maintain clinical measurements within normal limits will improve, and Compliance with prescribed medications will improve  I certify that inpatient services furnished can reasonably be expected to improve the patient's condition.    , MD 10/8/202311:45 AM

## 2022-04-28 NOTE — BHH Counselor (Signed)
Adult Comprehensive Assessment  Patient ID: [name] [name], male   DOB: June 01, 1996, 26 y.o.   MRN: [id number]  Information Source: Information source: Patient  Current Stressors:  Patient states their primary concerns and needs for treatment are:: During assessment, patient states he "is tired ot everyone getting away with things and not me." When asked what he was hoping to "get away with," he states that he "got to close to the nurse" while trying to kiss her. Per IVC paperwork, patient was halucinating and delusional prior to ACTT being called out. Patient states their goals for this hospitilization and ongoing recovery are:: States he has no goals for hospitalization. Educational / Learning stressors: None reported Employment / Job issues: Patient is unemployed Family Relationships: None reported Museum/gallery curator / Lack of resources (include bankruptcy): States he is running out of funds due to [employer] / Lack of housing: None reported Physical health (include injuries & life threatening diseases): None reported Social relationships: Reports self isolations leading to feelings of loneliness Substance abuse: Reports active cannabis use as money permits Bereavement / Loss: None reported  Living/Environment/Situation:  Living Arrangements: Parent Living conditions (as described by patient or guardian): States living conditions are WNL Who else lives in the home?: Patient lives with mother and younger brother How long has patient lived in current situation?: Reports living with mother periodically for some unknown period of time. What is atmosphere in current home: Comfortable  Family History:  Marital status: Single Are you sexually active?: Yes ("not as often as I would like") What is your sexual orientation?: Heterosexual Has your sexual activity been affected by drugs, alcohol, medication, or emotional stress?: No Does patient have children?: No  Childhood History:  By  whom was/is the patient raised?: Mother Additional childhood history information: All of pt's family lives in [place].  It has just been him, his mother, and younger brother living in the [place]; reports father left at age 16. Description of patient's relationship with caregiver when they were a child: Describes his relationship with his mother as a child as "safe and fun . . . she gave me everything I needed . . . too much coddling and I was spoiled." Patient's description of current relationship with people who raised him/her: States his current relationship with his mother remains "good" How were you disciplined when you got in trouble as a child/adolescent?: Patient reports he was not disciplined as a child. Does patient have siblings?: Yes Number of Siblings: 4 Description of patient's current relationship with siblings: "They're good." Did patient suffer any verbal/emotional/physical/sexual abuse as a child?: No Did patient suffer from severe childhood neglect?: No Has patient ever been sexually abused/assaulted/raped as an adolescent or adult?: No Was the patient ever a victim of a crime or a disaster?: No Witnessed domestic violence?: No Has patient been affected by domestic violence as an adult?: No  Education:  Highest grade of school patient has completed: 10th grade Currently a student?: No Learning disability?: No  Employment/Work Situation:   Employment Situation: Unemployed (Reports are inconsistent whether patient is currently on disablity, prior reports state patient is on disability. Currently patient state he is not on disability due to "pride." States he has been unemployed for the past 1 year.) Why is Patient on Disability: Mental Health Patient's Job has Been Impacted by Current Illness: No What is the Longest Time Patient has Held a Job?: Three years per pt. Previously noted to have reported only one year Where was the Patient  Employed at that Time?: [employer] per  pt. However, per previous PSA he reported [employer] Has Patient ever Been in the [employer]?: No  Financial Resources:   Financial resources: No income, Medicaid Does patient have a representative payee or guardian?: No (Patient denies)  Alcohol/Substance Abuse:   If attempted suicide, did drugs/alcohol play a role in this?: No Alcohol/Substance Abuse Treatment Hx: Denies past history Has alcohol/substance abuse ever caused legal problems?: No  Social Support System:   Patient's Community Support System: Good Describe Community Support System: States his ACTT and brother are supportive of his mental health and general wellbeing. Type of faith/religion: patient denies How does patient's faith help to cope with current illness?: n/a  Leisure/Recreation:   Do You Have Hobbies?: Yes Leisure and Hobbies: Reports he enjoys listening to music  Strengths/Needs:   Patient states these barriers may affect/interfere with their treatment: None reported Patient states these barriers may affect their return to the community: None reported Other important information patient would like considered in planning for their treatment: None reported  Discharge Plan:   Currently receiving community mental health services: Yes (From Whom) [name] [name] [facility]) Does patient have access to transportation?: No (states he has a car, though it is currently inoperable.) Does patient have financial barriers related to discharge medications?: No Plan for no access to transportation at discharge: CSW to assist with transportation from hospital. Patient has ACTT services, thus transportation should not be a barrier to mental health outpatient services.  Summary/Recommendations:   Summary and Recommendations (to be completed by the evaluator): 26 y/o male w/ dx of Schizophrenia, undifferentiated from [place] w/ [facility] medicaid admitted due to acute psychosis symptoms including auditory hallucinations, delusional  thought, and bizzare behaviors; symptoms have continued since the 1-2 weeks since being discharged from previous psychiatric hospitalization.  During assessment, patient states he "is tired ot everyone getting away with things and not me." When asked what he was hoping to "get away with," he states that he "got to close to the nurse" while trying to kiss her. Per IVC paperwork, patient was halucinating and delusional prior to ACTT being called out. States he has no goals for hospitalization.  Patient presents as calm and cooperative. Affect is blunted, congruent with mood and context. Appearance is WNL. Speech volume, speed, and content is WNL. No evidence of memory or concentration impairment. Patient oriented to person, place, time, and situation. Currently denies SI, HI, AVH. No evidence of psychotic features present during this encounter.     Patient is seen by [facility] for outpatient mental health services, wishes to continue with this agency. Therapeutic recommendations include further crisis stabilization, medication management, group therapy, and case management.   Corky Crafts. 04/28/2022

## 2022-04-28 NOTE — BHH Suicide Risk Assessment (Signed)
[facility] Admission Suicide Risk Assessment   Nursing information obtained from:  Patient Demographic factors:  Male, Low socioeconomic status, Unemployed Current Mental Status:  NA Loss Factors:  Decrease in vocational status Historical Factors:  NA Risk Reduction Factors:  Living with another person, especially a relative, Positive social support, Positive therapeutic relationship  Total Time spent with patient: 45 minutes Principal Problem: Schizophrenia, undifferentiated (HCC) Diagnosis:  Principal Problem:   Schizophrenia, undifferentiated (HCC) Active Problems:   Hypertension  Subjective Data: Patient seen and chart reviewed.  26 year old man with schizophrenia.  Patient denies suicidal ideation.  Denies homicidal ideation.  Current behavior is calm.  No active complaints.  Continued Clinical Symptoms:  Alcohol Use Disorder Identification Test Final Score (AUDIT): 0 The "Alcohol Use Disorders Identification Test", Guidelines for Use in Primary Care, Second Edition.  World Science writer [facility]). Score between 0-7:  no or low risk or alcohol related problems. Score between 8-15:  moderate risk of alcohol related problems. Score between 16-19:  high risk of alcohol related problems. Score 20 or above:  warrants further diagnostic evaluation for alcohol dependence and treatment.   CLINICAL FACTORS:   Schizophrenia:   Less than 81 years old   Musculoskeletal: Strength & Muscle Tone: within normal limits Gait & Station: normal Patient leans: N/A  Psychiatric Specialty Exam:  Presentation  General Appearance:  Casual; Well Groomed  Eye Contact: Good  Speech: Slow  Speech Volume: Decreased  Handedness: Right   Mood and Affect  Mood: Anxious  Affect: Inappropriate; Constricted   Thought Process  Thought Processes: Disorganized  Descriptions of Associations:Circumstantial  Orientation:Full (Time, Place and Person)  Thought Content:Paranoid Ideation;  Scattered; Illogical  History of Schizophrenia/Schizoaffective disorder:Yes  Duration of Psychotic Symptoms:Greater than six months  Hallucinations:No data recorded Ideas of Reference:Paranoia; Delusions  Suicidal Thoughts:No data recorded Homicidal Thoughts:No data recorded  Sensorium  Memory: Immediate Poor; Recent Poor; Remote Poor  Judgment: Poor  Insight: Poor   Executive Functions  Concentration: Fair  Attention Span: Poor  Recall: Poor  Fund of Knowledge: Poor  Language: Poor   Psychomotor Activity  Psychomotor Activity:No data recorded  Assets  Assets: Communication Skills; Desire for Improvement; Resilience; Social Support   Sleep  Sleep:No data recorded   Physical Exam: Physical Exam Vitals and nursing note reviewed.  Constitutional:      Appearance: Normal appearance.  HENT:     Head: Normocephalic and atraumatic.     Mouth/Throat:     Pharynx: Oropharynx is clear.  Eyes:     Pupils: Pupils are equal, round, and reactive to light.  Cardiovascular:     Rate and Rhythm: Normal rate and regular rhythm.  Pulmonary:     Effort: Pulmonary effort is normal.     Breath sounds: Normal breath sounds.  Abdominal:     General: Abdomen is flat.     Palpations: Abdomen is soft.  Musculoskeletal:        General: Normal range of motion.  Skin:    General: Skin is warm and dry.  Neurological:     General: No focal deficit present.     Mental Status: He is alert. Mental status is at baseline.  Psychiatric:        Attention and Perception: He is inattentive.        Mood and Affect: Mood normal. Affect is blunt.        Speech: Speech is delayed.        Behavior: Behavior is withdrawn.  Thought Content: Thought content normal.    Review of Systems  Constitutional: Negative.   HENT: Negative.    Eyes: Negative.   Respiratory: Negative.    Cardiovascular: Negative.   Gastrointestinal: Negative.   Musculoskeletal: Negative.    Skin: Negative.   Neurological: Negative.   Psychiatric/Behavioral: Negative.     Blood pressure 123/88, pulse 91, temperature 98.2 F (36.8 C), temperature source Oral, resp. rate 18, height 6\' 2"  (1.88 m), weight 100 kg, SpO2 100 %. Body mass index is 28.31 kg/m.   COGNITIVE FEATURES THAT CONTRIBUTE TO RISK:  Loss of executive function    SUICIDE RISK:   Minimal: No identifiable suicidal ideation.  Patients presenting with no risk factors but with morbid ruminations; may be classified as minimal risk based on the severity of the depressive symptoms  PLAN OF CARE: Continue current medicine.  15-minute checks.  Engage in individual and group therapy and assessment.  Ongoing assessment of dangerousness prior to discharge planning  I certify that inpatient services furnished can reasonably be expected to improve the patient's condition.   Alethia Berthold, MD 04/28/2022, 11:33 AM

## 2022-04-28 NOTE — BHH Suicide Risk Assessment (Signed)
[place] INPATIENT:  Family/Significant Other Suicide Prevention Education  Suicide Prevention Education:  Patient Refusal for Family/Significant Other Suicide Prevention Education: The patient [name] [name] has refused to provide written consent for family/significant other to be provided Family/Significant Other Suicide Prevention Education during admission and/or prior to discharge.  Physician notified.  Durenda Hurt 04/28/2022, 2:29 PM

## 2022-04-28 NOTE — BHH Group Notes (Signed)
[facility] Group Notes:  (Nursing/MHT/Case Management/Adjunct)  Date:  04/28/2022  Time:  8:35 PM  Type of Therapy:   Wrap up  Participation Level:  Active  Participation Quality:  Redirectable  Affect:  Resistant  Cognitive:  Alert  Insight:  Good  Engagement in Group:  Distracting, Engaged, Off Topic, and goal is to stir coffee  Modes of Intervention:  Limit-setting  Summary of Progress/Problems:  [name] [name] 04/28/2022, 8:35 PM

## 2022-04-28 NOTE — Plan of Care (Signed)
Pt is calm and cooperative, denies SI HI AVH.  Pt is compliant with medications and isolative to his room.  Pt denies physical symptoms.  Pt needed to be redirected for knocking at the door of a peer's room.  Continued monitoring via q 15 minute checks and unit presence.

## 2022-04-29 DIAGNOSIS — F203 Undifferentiated schizophrenia: Secondary | ICD-10-CM | POA: Diagnosis not present

## 2022-04-29 MED ORDER — HALOPERIDOL 5 MG PO TABS
5.0000 mg | ORAL_TABLET | Freq: Every day | ORAL | Status: DC
  Administered 2022-04-29 – 2022-05-02 (×4): 5 mg via ORAL
  Filled 2022-04-29 (×4): qty 1

## 2022-04-29 NOTE — Group Note (Signed)
B[facility] LCSW Group Therapy Note    Group Date: 04/29/2022 Start Time: 1300 End Time: 1400  Type of Therapy and Topic:  Group Therapy:  Overcoming Obstacles  Participation Level:  [facility] PARTICIPATION LEVEL: Did Not Attend   Description of Group:   In this group patients will be encouraged to explore what they see as obstacles to their own wellness and recovery. They will be guided to discuss their thoughts, feelings, and behaviors related to these obstacles. The group will process together ways to cope with barriers, with attention given to specific choices patients can make. Each patient will be challenged to identify changes they are motivated to make in order to overcome their obstacles. This group will be process-oriented, with patients participating in exploration of their own experiences as well as giving and receiving support and challenge from other group members.  Therapeutic Goals: 1. Patient will identify personal and current obstacles as they relate to admission. 2. Patient will identify barriers that currently interfere with their wellness or overcoming obstacles.  3. Patient will identify feelings, thought process and behaviors related to these barriers. 4. Patient will identify two changes they are willing to make to overcome these obstacles:    Summary of Patient Progress X   Therapeutic Modalities:   Cognitive Behavioral Therapy Solution Focused Therapy Motivational Interviewing Relapse Prevention Therapy   Roni Scow R Stephenie Navejas, LCSW 

## 2022-04-29 NOTE — BH IP Treatment Plan (Signed)
Interdisciplinary Treatment and Diagnostic Plan Update  04/29/2022 Time of Session: 09:39 [name] [name] MRN: [id number]  Principal Diagnosis: Schizophrenia, undifferentiated ([place])  Secondary Diagnoses: Principal Problem:   Schizophrenia, undifferentiated ([name] [name]) Active Problems:   Hypertension   Current Medications:  Current Facility-Administered Medications  Medication Dose Route Frequency Provider Last Rate Last Admin   acetaminophen (TYLENOL) tablet 650 mg  650 mg Oral Q6H PRN [name], [name], MD       alum & mag hydroxide-simeth (MAALOX/MYLANTA) 200-200-20 MG/5ML suspension 30 mL  30 mL Oral Q4H PRN [name], [name], MD       amLODipine (NORVASC) tablet 10 mg  10 mg Oral Daily [name], [name], MD   10 mg at 04/29/22 0856   cloZAPine (CLOZARIL) tablet 100 mg  100 mg Oral QHS [name], [name], MD   100 mg at 04/28/22 2148   divalproex (DEPAKOTE ER) 24 hr tablet 500 mg  500 mg Oral BID [name], [name], MD   500 mg at 04/29/22 0856   hydrOXYzine (ATARAX) tablet 50 mg  50 mg Oral TID PRN [name], Madie Reno, MD       magnesium hydroxide (MILK OF MAGNESIA) suspension 30 mL  30 mL Oral Daily PRN [name], Madie Reno, MD       Derrill Memo ON 05/06/2022] paliperidone (INVEGA SUSTENNA) injection 234 mg  234 mg Intramuscular Q28 days [name], [name], MD       paliperidone (INVEGA) 24 hr tablet 6 mg  6 mg Oral Daily [name], [name], MD   6 mg at 04/29/22 0856   traZODone (DESYREL) tablet 100 mg  100 mg Oral QHS [name], [name], MD   100 mg at 04/28/22 2148   PTA Medications: Medications Prior to Admission  Medication Sig Dispense Refill Last Dose   cloZAPine (CLOZARIL) 100 MG tablet Take 1 tablet (100 mg total) by mouth at bedtime. 30 tablet 1 Past Week   [START ON 05/06/2022] paliperidone (INVEGA SUSTENNA) 234 MG/1.5ML injection Inject 234 mg into the muscle every 28 (twenty-eight) days. 1.5 mL 1 Past Week   amLODipine (NORVASC) 10 MG tablet Take 1 tablet (10 mg total) by mouth daily. 30 tablet 1 unk    divalproex (DEPAKOTE ER) 500 MG 24 hr tablet Take 1 tablet (500 mg total) by mouth 2 (two) times daily. 60 tablet 1 unk   paliperidone (INVEGA) 6 MG 24 hr tablet Take 1 tablet (6 mg total) by mouth daily. 30 tablet 1    traZODone (DESYREL) 100 MG tablet Take 1 tablet (100 mg total) by mouth at bedtime. 30 tablet 1 unk    Patient Stressors:    Patient Strengths:    Treatment Modalities: Medication Management, Group therapy, Case management,  1 to 1 session with clinician, Psychoeducation, Recreational therapy.   Physician Treatment Plan for Primary Diagnosis: Schizophrenia, undifferentiated ([name] [name]) Long Term Goal(s): Improvement in symptoms so as ready for discharge   Short Term Goals: Ability to verbalize feelings will improve Ability to demonstrate self-control will improve Ability to maintain clinical measurements within normal limits will improve Compliance with prescribed medications will improve  Medication Management: Evaluate patient's response, side effects, and tolerance of medication regimen.  Therapeutic Interventions: 1 to 1 sessions, Unit Group sessions and Medication administration.  Evaluation of Outcomes: Not Met  Physician Treatment Plan for Secondary Diagnosis: Principal Problem:   Schizophrenia, undifferentiated ([name] [name]) Active Problems:   Hypertension  Long Term Goal(s): Improvement in symptoms so as ready for discharge   Short Term Goals:  Ability to verbalize feelings will improve Ability to demonstrate self-control will improve Ability to maintain clinical measurements within normal limits will improve Compliance with prescribed medications will improve     Medication Management: Evaluate patient's response, side effects, and tolerance of medication regimen.  Therapeutic Interventions: 1 to 1 sessions, Unit Group sessions and Medication administration.  Evaluation of Outcomes: Not Met   RN Treatment Plan for Primary Diagnosis: Schizophrenia,  undifferentiated ([name] [name]) Long Term Goal(s): Knowledge of disease and therapeutic regimen to maintain health will improve  Short Term Goals: Ability to remain free from injury will improve, Ability to verbalize frustration and anger appropriately will improve, Ability to demonstrate self-control, Ability to participate in decision making will improve, Ability to verbalize feelings will improve, Ability to disclose and discuss suicidal ideas, Ability to identify and develop effective coping behaviors will improve, and Compliance with prescribed medications will improve  Medication Management: RN will administer medications as ordered by provider, will assess and evaluate patient's response and provide education to patient for prescribed medication. RN will report any adverse and/or side effects to prescribing provider.  Therapeutic Interventions: 1 on 1 counseling sessions, Psychoeducation, Medication administration, Evaluate responses to treatment, Monitor vital signs and CBGs as ordered, Perform/monitor CIWA, COWS, AIMS and Fall Risk screenings as ordered, Perform wound care treatments as ordered.  Evaluation of Outcomes: Not Met   LCSW Treatment Plan for Primary Diagnosis: Schizophrenia, undifferentiated ([name] [name]) Long Term Goal(s): Safe transition to appropriate next level of care at discharge, Engage patient in therapeutic group addressing interpersonal concerns.  Short Term Goals: Engage patient in aftercare planning with referrals and resources, Increase social support, Increase ability to appropriately verbalize feelings, Increase emotional regulation, Facilitate acceptance of mental health diagnosis and concerns, and Increase skills for wellness and recovery  Therapeutic Interventions: Assess for all discharge needs, 1 to 1 time with Social worker, Explore available resources and support systems, Assess for adequacy in community support network, Educate family and significant other(s) on suicide  prevention, Complete Psychosocial Assessment, Interpersonal group therapy.  Evaluation of Outcomes: Not Met   Progress in Treatment: Attending groups: Yes. and No. Participating in groups: Yes. and No. Taking medication as prescribed: Yes. Toleration medication: Yes. Family/Significant other contact made: No, will contact:  if given permission. Patient understands diagnosis: Yes. Discussing patient identified problems/goals with staff: Yes. Medical problems stabilized or resolved: Yes. Denies suicidal/homicidal ideation: Yes. Issues/concerns per patient self-inventory: No. Other: none.  New problem(s) identified: No, Describe:  none identified.  New Short Term/Long Term Goal(s): elimination of symptoms of psychosis, medication management for mood stabilization; elimination of SI thoughts; development of comprehensive mental wellness plan.  Patient Goals:  "I don't have too many goals."  Discharge Plan or Barriers: CSW will assist pt with development of an appropriate aftercare/discharge plan.   Reason for Continuation of Hospitalization: Medication stabilization Other; describe psychosis  Estimated Length of Stay: 1-7 days  Last 3 [place] Suicide Severity Risk Score: [place] Admission (Current) from 04/27/2022 in [place] ED from 04/26/2022 in [place] Admission (Discharged) from 04/16/2022 in [place] No Risk No Risk No Risk       Last PHQ 2/9 Scores:     No data to display          Scribe for Treatment Team: Shirl Harris, LCSW 04/29/2022 10:34 AM

## 2022-04-29 NOTE — Progress Notes (Signed)
Pt is calm and cooperative. Alerted and oriented X4. Denies SI/HI. Compliant to medications. Vital signs are stable. Denies pain. Patient was exercising and watching TV  in the day room After night medications. Pleasant. No other issues. Q 15 Minutes checks are maintained.

## 2022-04-29 NOTE — Progress Notes (Signed)
Recreation Therapy Notes  Date: 04/29/2022  Time: 10:50 am   Location: Craft room       Behavioral response: N/A   Intervention Topic: Time Management   Discussion/Intervention: Patient refused to attend group.   Clinical Observations/Feedback:  Patient refused to attend group.    Borna Wessinger LRT/CTRS        Darrel Baroni 04/29/2022 11:47 AM

## 2022-04-29 NOTE — Progress Notes (Signed)
[facility] MD Progress Note  04/29/2022 3:32 PM [name] [name]  MRN:  [id number] Subjective: Patient seen for follow-up today and also came to treatment team.  Not really able to show much inside to articulate much in the way of a positive plan.  I communicated with his ACT team today.  They are sorry to hear that he cannot go to longer term care as they point out that he is resistant to both long-acting medications and poorly compliant with a medicine like clozapine.  Patient as usual stays pretty withdrawn in the hospital although he still has his moments of getting agitated and disorganized Principal Problem: Schizophrenia, undifferentiated (HCC) Diagnosis: Principal Problem:   Schizophrenia, undifferentiated (HCC) Active Problems:   Hypertension  Total Time spent with patient: 30 minutes  Past Psychiatric History: Past history of schizophrenia  Past Medical History:  Past Medical History:  Diagnosis Date   Asthma    Depression    Psychosis (HCC)    Schizoaffective disorder (HCC)     Past Surgical History:  Procedure Laterality Date   BACK SURGERY     I&D groin  2017   Family History: History reviewed. No pertinent family history. Family Psychiatric  History: See previous Social History:  Social History   Substance and Sexual Activity  Alcohol Use No     Social History   Substance and Sexual Activity  Drug Use Yes   Types: Marijuana   Comment: reports cannabis use as funds permit    Social History   Socioeconomic History   Marital status: Single    Spouse name: Not on file   Number of children: Not on file   Years of education: Not on file   Highest education level: Not on file  Occupational History   Not on file  Tobacco Use   Smoking status: Every Day    Packs/day: 0.25    Types: Cigarettes   Smokeless tobacco: Never  Vaping Use   Vaping Use: Some days  Substance and Sexual Activity   Alcohol use: No   Drug use: Yes    Types: Marijuana    Comment:  reports cannabis use as funds permit   Sexual activity: Not Currently    Partners: Female  Other Topics Concern   Not on file  Social History Narrative   Not on file   Social Determinants of Health   Financial Resource Strain: Not on file  Food Insecurity: No Food Insecurity (04/27/2022)   Hunger Vital Sign    Worried About Running Out of Food in the Last Year: Never true    Ran Out of Food in the Last Year: Never true  Transportation Needs: No Transportation Needs (04/27/2022)   PRAPARE - Administrator, Civil Service (Medical): No    Lack of Transportation (Non-Medical): No  Physical Activity: Not on file  Stress: Not on file  Social Connections: Not on file   Additional Social History:                         Sleep: Fair  Appetite:  Fair  Current Medications: Current Facility-Administered Medications  Medication Dose Route Frequency Provider Last Rate Last Admin   acetaminophen (TYLENOL) tablet 650 mg  650 mg Oral Q6H PRN Forever Arechiga T, MD       alum & mag hydroxide-simeth (MAALOX/MYLANTA) 200-200-20 MG/5ML suspension 30 mL  30 mL Oral Q4H PRN Phyillis Dascoli, [name], MD  amLODipine (NORVASC) tablet 10 mg  10 mg Oral Daily Solan Vosler T, MD   10 mg at 04/29/22 0856   cloZAPine (CLOZARIL) tablet 100 mg  100 mg Oral QHS Jonni Oelkers T, MD   100 mg at 04/28/22 2148   divalproex (DEPAKOTE ER) 24 hr tablet 500 mg  500 mg Oral BID Amaziah Raisanen T, MD   500 mg at 04/29/22 0856   hydrOXYzine (ATARAX) tablet 50 mg  50 mg Oral TID PRN Jamian Andujo, [name], MD       magnesium hydroxide (MILK OF MAGNESIA) suspension 30 mL  30 mL Oral Daily PRN Melessa Cowell, [name], MD       Melene Muller ON 05/06/2022] paliperidone (INVEGA SUSTENNA) injection 234 mg  234 mg Intramuscular Q28 days Rad Gramling T, MD       paliperidone (INVEGA) 24 hr tablet 6 mg  6 mg Oral Daily Keni Elison, [name], MD   6 mg at 04/29/22 0856   traZODone (DESYREL) tablet 100 mg  100 mg Oral QHS Jazzman Loughmiller, [name], MD    100 mg at 04/28/22 2148    Lab Results: No results found for this or any previous visit (from the past 48 hour(s)).  Blood Alcohol level:  Lab Results  Component Value Date   ETH <10 04/26/2022   ETH <10 04/12/2022    Metabolic Disorder Labs: Lab Results  Component Value Date   HGBA1C 5.1 12/03/2021   MPG 99.67 12/03/2021   MPG 91.06 07/19/2020   No results found for: "PROLACTIN" Lab Results  Component Value Date   CHOL 197 12/06/2021   TRIG 111 12/06/2021   HDL 46 12/06/2021   CHOLHDL 4.3 12/06/2021   VLDL 22 12/06/2021   LDLCALC 129 (H) 12/06/2021   LDLCALC 177 (H) 07/19/2020    Physical Findings: AIMS:  , ,  ,  ,    CIWA:    COWS:     Musculoskeletal: Strength & Muscle Tone: within normal limits Gait & Station: normal Patient leans: N/A  Psychiatric Specialty Exam:  Presentation  General Appearance:  Casual; Well Groomed  Eye Contact: Good  Speech: Slow  Speech Volume: Decreased  Handedness: Right   Mood and Affect  Mood: Anxious  Affect: Inappropriate; Constricted   Thought Process  Thought Processes: Disorganized  Descriptions of Associations:Circumstantial  Orientation:Full (Time, Place and Person)  Thought Content:Paranoid Ideation; Scattered; Illogical  History of Schizophrenia/Schizoaffective disorder:Yes  Duration of Psychotic Symptoms:Greater than six months  Hallucinations:No data recorded Ideas of Reference:Paranoia; Delusions  Suicidal Thoughts:No data recorded Homicidal Thoughts:No data recorded  Sensorium  Memory: Immediate Poor; Recent Poor; Remote Poor  Judgment: Poor  Insight: Poor   Executive Functions  Concentration: Fair  Attention Span: Poor  Recall: Poor  Fund of Knowledge: Poor  Language: Poor   Psychomotor Activity  Psychomotor Activity:No data recorded  Assets  Assets: Communication Skills; Desire for Improvement; Resilience; Social Support   Sleep  Sleep:No data  recorded   Physical Exam: Physical Exam Nursing note reviewed.  Constitutional:      Appearance: Normal appearance.  HENT:     Head: Normocephalic and atraumatic.     Mouth/Throat:     Pharynx: Oropharynx is clear.  Eyes:     Pupils: Pupils are equal, round, and reactive to light.  Cardiovascular:     Rate and Rhythm: Normal rate and regular rhythm.  Pulmonary:     Effort: Pulmonary effort is normal.     Breath sounds: Normal breath sounds.  Abdominal:  General: Abdomen is flat.     Palpations: Abdomen is soft.  Musculoskeletal:        General: Normal range of motion.  Skin:    General: Skin is warm and dry.  Neurological:     General: No focal deficit present.     Mental Status: He is alert. Mental status is at baseline.  Psychiatric:        Attention and Perception: He is inattentive.        Mood and Affect: Mood normal. Affect is blunt.        Speech: Speech is delayed.        Behavior: Behavior is slowed.        Thought Content: Thought content normal.    Review of Systems  Constitutional: Negative.   HENT: Negative.    Eyes: Negative.   Respiratory: Negative.    Cardiovascular: Negative.   Gastrointestinal: Negative.   Musculoskeletal: Negative.   Skin: Negative.   Neurological: Negative.   Psychiatric/Behavioral: Negative.     Blood pressure 128/85, pulse 90, temperature 98.6 F (37 C), temperature source Oral, resp. rate 18, height 6\' 2"  (1.88 m), weight 100 kg, SpO2 100 %. Body mass index is 28.31 kg/m.   Treatment Plan Summary: Medication management and Plan reviewing his medication.  He is supposed to be on the long-acting Invega which should be due in another few days.  Here he has been taking the clozapine but if he is not going to be compliant with it outside the hospital it is a little hard to know how valuable it is to reestablish him on it.  He might do better if he were on 2 long-acting injectable medications.  We can investigate perhaps  restarting Haldol in place of the Clozapine to see if by doing that we could put him on long-acting Haldol shots.  Alethia Berthold, MD 04/29/2022, 3:32 PM

## 2022-04-29 NOTE — Progress Notes (Signed)
Patient was cooperative with treatment and medications. He was observed pacing hallways during the evening at sometimes he needed redirection but he was redirectable. He spent most of the evening in the room most isolative. He denies SI & AVH. He seemed to rest well through out the night.

## 2022-04-29 NOTE — Plan of Care (Signed)
D- Patient alert and oriented. Patient mood is flat; however, patient is in engaging in treatment. Patient talked with Probation officer during med administration. Patient denies SI, HI, AVH, and pain. Patient states his goal is to obtain a job so that he can better self- manage when he is discharged.  A- Scheduled medications administered to patient, per MD orders. Support and encouragement provided.  Routine safety checks conducted every 15 minutes.  Patient informed to notify staff with problems or concerns.  R- No adverse drug reactions noted. Patient contracts for safety at this time. Patient compliant with medications and treatment plan. Patient receptive, calm, and cooperative. Patient interacts well with others on the unit.  Patient remains safe at this time.   Problem: Health Behavior/Discharge Planning: Goal: Ability to manage health-related needs will improve Outcome: Progressing   Problem: Pain Managment: Goal: General experience of comfort will improve Outcome: Progressing   Problem: Safety: Goal: Ability to remain free from injury will improve Outcome: Progressing

## 2022-04-30 DIAGNOSIS — F203 Undifferentiated schizophrenia: Secondary | ICD-10-CM | POA: Diagnosis not present

## 2022-04-30 MED ORDER — TRAZODONE HCL 100 MG PO TABS: 100.0000 mg | ORAL_TABLET | Freq: Once | ORAL | Status: DC

## 2022-04-30 NOTE — Progress Notes (Signed)
Recreation Therapy Notes    Date: 04/30/2022  Time: 10:40 am   Location: Craft room    Behavioral response: Appropriate  Intervention Topic:  Goals   Discussion/Intervention:  Group content on today was focused on goals. Patients described what goals are and how they define goals. Individuals expressed how they go about setting goals and reaching them. The group identified how important goals are and if they make short term goals to reach long term goals. Patients described how many goals they work on at a time and what affects them not reaching their goal. Individuals described how much time they put into planning and obtaining their goals. The group participated in the intervention "My Goal Board" and made personal goal boards to help them achieve their goal. Clinical Observations/Feedback: Patient came to group smiling awkwardly, laughing and talking to himself. Individual was social with peers and staff while participating in the intervention.    Amahd Morino LRT/CTRS         Xia Stohr 04/30/2022 12:13 PM

## 2022-04-30 NOTE — Plan of Care (Signed)
  Problem: Education: Goal: Knowledge of General Education information will improve Description: Including pain rating scale, medication(s)/side effects and non-pharmacologic comfort measures Outcome: Progressing   Problem: Health Behavior/Discharge Planning: Goal: Ability to manage health-related needs will improve Outcome: Progressing   Problem: Clinical Measurements: Goal: Ability to maintain clinical measurements within normal limits will improve Outcome: Progressing Goal: Will remain free from infection Outcome: Progressing   Problem: Nutrition: Goal: Adequate nutrition will be maintained Outcome: Progressing   Problem: Safety: Goal: Ability to remain free from injury will improve Outcome: Progressing

## 2022-04-30 NOTE — Progress Notes (Signed)
[facility] MD Progress Note  04/30/2022 3:35 PM [name] [name]  MRN:  [id number] Subjective: Follow-up 26 year old man with schizophrenia.  No new complaints.  He is up during the day and interacting with others and is pleasant.  Not aggressive.  Denies suicidal or homicidal thoughts.  Denies hallucinations.  No sign of side effects Principal Problem: Schizophrenia, undifferentiated (HCC) Diagnosis: Principal Problem:   Schizophrenia, undifferentiated (HCC) Active Problems:   Hypertension  Total Time spent with patient: 30 minutes  Past Psychiatric History: Schizophrenia which has been gradually getting worse for several years.  Multiple hospitalizations  Past Medical History:  Past Medical History:  Diagnosis Date   Asthma    Depression    Psychosis ([place])    Schizoaffective disorder ([place])     Past Surgical History:  Procedure Laterality Date   BACK SURGERY     I&D groin  2017   Family History: History reviewed. No pertinent family history. Family Psychiatric  History: See previous Social History:  Social History   Substance and Sexual Activity  Alcohol Use No     Social History   Substance and Sexual Activity  Drug Use Yes   Types: Marijuana   Comment: reports cannabis use as funds permit    Social History   Socioeconomic History   Marital status: Single    Spouse name: Not on file   Number of children: Not on file   Years of education: Not on file   Highest education level: Not on file  Occupational History   Not on file  Tobacco Use   Smoking status: Every Day    Packs/day: 0.25    Types: Cigarettes   Smokeless tobacco: Never  Vaping Use   Vaping Use: Some days  Substance and Sexual Activity   Alcohol use: No   Drug use: Yes    Types: Marijuana    Comment: reports cannabis use as funds permit   Sexual activity: Not Currently    Partners: Female  Other Topics Concern   Not on file  Social History Narrative   Not on file   Social Determinants of  Health   Financial Resource Strain: Not on file  Food Insecurity: No Food Insecurity (04/27/2022)   Hunger Vital Sign    Worried About Running Out of Food in the Last Year: Never true    Ran Out of Food in the Last Year: Never true  Transportation Needs: No Transportation Needs (04/27/2022)   PRAPARE - Hydrologist (Medical): No    Lack of Transportation (Non-Medical): No  Physical Activity: Not on file  Stress: Not on file  Social Connections: Not on file   Additional Social History:                         Sleep: Fair  Appetite:  Fair  Current Medications: Current Facility-Administered Medications  Medication Dose Route Frequency Provider Last Rate Last Admin   acetaminophen (TYLENOL) tablet 650 mg  650 mg Oral Q6H PRN Journe Hallmark T, MD       alum & mag hydroxide-simeth (MAALOX/MYLANTA) 200-200-20 MG/5ML suspension 30 mL  30 mL Oral Q4H PRN Shirley Decamp T, MD       amLODipine (NORVASC) tablet 10 mg  10 mg Oral Daily Danyale Ridinger T, MD   10 mg at 04/30/22 0160   divalproex (DEPAKOTE ER) 24 hr tablet 500 mg  500 mg Oral BID Dorraine Ellender, Madie Reno, MD  500 mg at 04/30/22 9562   haloperidol (HALDOL) tablet 5 mg  5 mg Oral QHS Ashwini Jago T, MD   5 mg at 04/29/22 2101   hydrOXYzine (ATARAX) tablet 50 mg  50 mg Oral TID PRN Micheal Sheen, [name], MD       magnesium hydroxide (MILK OF MAGNESIA) suspension 30 mL  30 mL Oral Daily PRN Lorilei Horan, [name], MD       Melene Muller ON 05/06/2022] paliperidone (INVEGA SUSTENNA) injection 234 mg  234 mg Intramuscular Q28 days Pressley Tadesse T, MD       paliperidone (INVEGA) 24 hr tablet 6 mg  6 mg Oral Daily Evalynn Hankins, [name], MD   6 mg at 04/30/22 1308   traZODone (DESYREL) tablet 100 mg  100 mg Oral QHS Tavis Kring, [name], MD   100 mg at 04/29/22 2101    Lab Results: No results found for this or any previous visit (from the past 48 hour(s)).  Blood Alcohol level:  Lab Results  Component Value Date   ETH <10 04/26/2022    ETH <10 04/12/2022    Metabolic Disorder Labs: Lab Results  Component Value Date   HGBA1C 5.1 12/03/2021   MPG 99.67 12/03/2021   MPG 91.06 07/19/2020   No results found for: "PROLACTIN" Lab Results  Component Value Date   CHOL 197 12/06/2021   TRIG 111 12/06/2021   HDL 46 12/06/2021   CHOLHDL 4.3 12/06/2021   VLDL 22 12/06/2021   LDLCALC 129 (H) 12/06/2021   LDLCALC 177 (H) 07/19/2020    Physical Findings: AIMS:  , ,  ,  ,    CIWA:    COWS:     Musculoskeletal: Strength & Muscle Tone: within normal limits Gait & Station: normal Patient leans: N/A  Psychiatric Specialty Exam:  Presentation  General Appearance:  Casual; Well Groomed  Eye Contact: Good  Speech: Slow  Speech Volume: Decreased  Handedness: Right   Mood and Affect  Mood: Anxious  Affect: Inappropriate; Constricted   Thought Process  Thought Processes: Disorganized  Descriptions of Associations:Circumstantial  Orientation:Full (Time, Place and Person)  Thought Content:Paranoid Ideation; Scattered; Illogical  History of Schizophrenia/Schizoaffective disorder:Yes  Duration of Psychotic Symptoms:Greater than six months  Hallucinations:No data recorded Ideas of Reference:Paranoia; Delusions  Suicidal Thoughts:No data recorded Homicidal Thoughts:No data recorded  Sensorium  Memory: Immediate Poor; Recent Poor; Remote Poor  Judgment: Poor  Insight: Poor   Executive Functions  Concentration: Fair  Attention Span: Poor  Recall: Poor  Fund of Knowledge: Poor  Language: Poor   Psychomotor Activity  Psychomotor Activity:No data recorded  Assets  Assets: Communication Skills; Desire for Improvement; Resilience; Social Support   Sleep  Sleep:No data recorded   Physical Exam: Physical Exam Vitals and nursing note reviewed.  Constitutional:      Appearance: Normal appearance.  HENT:     Head: Normocephalic and atraumatic.     Mouth/Throat:      Pharynx: Oropharynx is clear.  Eyes:     Pupils: Pupils are equal, round, and reactive to light.  Cardiovascular:     Rate and Rhythm: Normal rate and regular rhythm.  Pulmonary:     Effort: Pulmonary effort is normal.     Breath sounds: Normal breath sounds.  Abdominal:     General: Abdomen is flat.     Palpations: Abdomen is soft.  Musculoskeletal:        General: Normal range of motion.  Skin:    General: Skin is warm and dry.  Neurological:     General: No focal deficit present.     Mental Status: He is alert. Mental status is at baseline.  Psychiatric:        Attention and Perception: Attention normal.        Mood and Affect: Mood normal. Affect is blunt.        Speech: Speech normal.        Behavior: Behavior is cooperative.        Thought Content: Thought content normal.        Cognition and Memory: Cognition normal.    Review of Systems  Constitutional: Negative.   HENT: Negative.    Eyes: Negative.   Respiratory: Negative.    Cardiovascular: Negative.   Gastrointestinal: Negative.   Musculoskeletal: Negative.   Skin: Negative.   Neurological: Negative.   Psychiatric/Behavioral: Negative.     Blood pressure 137/84, pulse 88, temperature 98 F (36.7 C), temperature source Oral, resp. rate 18, height 6\' 2"  (1.88 m), weight 100 kg, SpO2 99 %. Body mass index is 28.31 kg/m.   Treatment Plan Summary: Medication management and Plan no change to medicine for today.  Spoke with patient about the difficulties we faced with discharging given that he seems to have such different behavior at home.  Continue to monitor daily and encourage group attendance  , MD 04/30/2022, 3:35 PM

## 2022-04-30 NOTE — Group Note (Signed)

## 2022-04-30 NOTE — [facility] Counselor (Signed)
CSW spoke with Larkin Ina of [facility] APS. He inquired if pt was here and if he could come visit him on the unit. CSW stated that he would ask the pt if this was ok rather than have APS worker come over and pt potentially decline to speak. Larkin Ina agreed. CSW confirmed that pt was ok with meeting with [place] worker. No other concerns expressed. Contact ended without incident.   CSW sat with pt during interview with Larkin Ina. Meeting completed without incident. Pt does not appear to be in any distress regarding this contact.   CSW will continue to follow.   Chalmers Guest. Guerry Bruin, MSW, LCSW, [place] 04/30/2022 1:27 PM

## 2022-04-30 NOTE — Progress Notes (Signed)
Patient calm and pleasant during assessment denying SI/HI/AVH. Pt observed interacting appropriately with staff and peers on the unit. Pt compliant with medication administration per MD orders. Pt given education, support, and encouragement to be active in his treatment plan. Pt being monitored Q 15 minutes for safety per unit protocol, remains safe on the unit  

## 2022-04-30 NOTE — Progress Notes (Signed)
Patient is A+O x 4. He denies SI/HI/AVH, depression and anxiety. Patient presents with a flat affect but pleasant mood. Appetite good. Patient is medication compliant. No distress noted.   No PRN's adm.  Q15 minute unit checks in place.

## 2022-05-01 DIAGNOSIS — F203 Undifferentiated schizophrenia: Secondary | ICD-10-CM | POA: Diagnosis not present

## 2022-05-01 MED ORDER — PALIPERIDONE PALMITATE ER 234 MG/1.5ML IM SUSY
234.0000 mg | PREFILLED_SYRINGE | INTRAMUSCULAR | Status: DC
  Administered 2022-05-02: 234 mg via INTRAMUSCULAR
  Filled 2022-05-01: qty 1.5

## 2022-05-01 NOTE — Progress Notes (Signed)
[facility] MD Progress Note  05/01/2022 3:56 PM [name] [name]  MRN:  [id number] Subjective: Patient seen and chart reviewed.  Patient with schizophrenia no new behavior problems.  Quite pleasant in interaction denies hallucinations not acting bizarrely.  Taking care of his health and ADLs well.  Taking his medicine. Principal Problem: Schizophrenia, undifferentiated (HCC) Diagnosis: Principal Problem:   Schizophrenia, undifferentiated (HCC) Active Problems:   Hypertension  Total Time spent with patient: 30 minutes  Past Psychiatric History: Past history of schizophrenia  Past Medical History:  Past Medical History:  Diagnosis Date   Asthma    Depression    Psychosis (HCC)    Schizoaffective disorder (HCC)     Past Surgical History:  Procedure Laterality Date   BACK SURGERY     I&D groin  2017   Family History: History reviewed. No pertinent family history. Family Psychiatric  History: See previous Social History:  Social History   Substance and Sexual Activity  Alcohol Use No     Social History   Substance and Sexual Activity  Drug Use Yes   Types: Marijuana   Comment: reports cannabis use as funds permit    Social History   Socioeconomic History   Marital status: Single    Spouse name: Not on file   Number of children: Not on file   Years of education: Not on file   Highest education level: Not on file  Occupational History   Not on file  Tobacco Use   Smoking status: Every Day    Packs/day: 0.25    Types: Cigarettes   Smokeless tobacco: Never  Vaping Use   Vaping Use: Some days  Substance and Sexual Activity   Alcohol use: No   Drug use: Yes    Types: Marijuana    Comment: reports cannabis use as funds permit   Sexual activity: Not Currently    Partners: Female  Other Topics Concern   Not on file  Social History Narrative   Not on file   Social Determinants of Health   Financial Resource Strain: Not on file  Food Insecurity: No Food Insecurity  (04/27/2022)   Hunger Vital Sign    Worried About Running Out of Food in the Last Year: Never true    Ran Out of Food in the Last Year: Never true  Transportation Needs: No Transportation Needs (04/27/2022)   PRAPARE - Administrator, Civil Service (Medical): No    Lack of Transportation (Non-Medical): No  Physical Activity: Not on file  Stress: Not on file  Social Connections: Not on file   Additional Social History:                         Sleep: Fair  Appetite:  Fair  Current Medications: Current Facility-Administered Medications  Medication Dose Route Frequency Provider Last Rate Last Admin   acetaminophen (TYLENOL) tablet 650 mg  650 mg Oral Q6H PRN Cornelious Diven T, MD       alum & mag hydroxide-simeth (MAALOX/MYLANTA) 200-200-20 MG/5ML suspension 30 mL  30 mL Oral Q4H PRN Stepanie Graver T, MD       amLODipine (NORVASC) tablet 10 mg  10 mg Oral Daily Ronnald Shedden T, MD   10 mg at 05/01/22 0802   divalproex (DEPAKOTE ER) 24 hr tablet 500 mg  500 mg Oral BID Lyndel Dancel T, MD   500 mg at 05/01/22 0802   haloperidol (HALDOL) tablet 5 mg  5  mg Oral QHS Morse Brueggemann T, MD   5 mg at 04/30/22 2102   hydrOXYzine (ATARAX) tablet 50 mg  50 mg Oral TID PRN Joushua Dugar, Madie Reno, MD   50 mg at 04/30/22 2102   magnesium hydroxide (MILK OF MAGNESIA) suspension 30 mL  30 mL Oral Daily PRN Jastin Fore, Madie Reno, MD       Derrill Memo ON 05/02/2022] paliperidone (INVEGA SUSTENNA) injection 234 mg  234 mg Intramuscular Q28 days Freddi Forster T, MD       paliperidone (INVEGA) 24 hr tablet 6 mg  6 mg Oral Daily Nishaan Stanke T, MD   6 mg at 05/01/22 0801   traZODone (DESYREL) tablet 100 mg  100 mg Oral QHS Jordyan Hardiman T, MD   100 mg at 04/30/22 2102    Lab Results: No results found for this or any previous visit (from the past 48 hour(s)).  Blood Alcohol level:  Lab Results  Component Value Date   ETH <10 04/26/2022   ETH <10 93/23/5573    Metabolic Disorder Labs: Lab Results   Component Value Date   HGBA1C 5.1 12/03/2021   MPG 99.67 12/03/2021   MPG 91.06 07/19/2020   No results found for: "PROLACTIN" Lab Results  Component Value Date   CHOL 197 12/06/2021   TRIG 111 12/06/2021   HDL 46 12/06/2021   CHOLHDL 4.3 12/06/2021   VLDL 22 12/06/2021   LDLCALC 129 (H) 12/06/2021   LDLCALC 177 (H) 07/19/2020    Physical Findings: AIMS:  , ,  ,  ,    CIWA:    COWS:     Musculoskeletal: Strength & Muscle Tone: within normal limits Gait & Station: normal Patient leans: N/A  Psychiatric Specialty Exam:  Presentation  General Appearance:  Casual; Well Groomed  Eye Contact: Good  Speech: Slow  Speech Volume: Decreased  Handedness: Right   Mood and Affect  Mood: Anxious  Affect: Inappropriate; Constricted   Thought Process  Thought Processes: Disorganized  Descriptions of Associations:Circumstantial  Orientation:Full (Time, Place and Person)  Thought Content:Paranoid Ideation; Scattered; Illogical  History of Schizophrenia/Schizoaffective disorder:Yes  Duration of Psychotic Symptoms:Greater than six months  Hallucinations:No data recorded Ideas of Reference:Paranoia; Delusions  Suicidal Thoughts:No data recorded Homicidal Thoughts:No data recorded  Sensorium  Memory: Immediate Poor; Recent Poor; Remote Poor  Judgment: Poor  Insight: Poor   Executive Functions  Concentration: Fair  Attention Span: Poor  Recall: Poor  Fund of Knowledge: Poor  Language: Poor   Psychomotor Activity  Psychomotor Activity:No data recorded  Assets  Assets: Communication Skills; Desire for Improvement; Resilience; Social Support   Sleep  Sleep:No data recorded   Physical Exam: Physical Exam Vitals and nursing note reviewed.  Constitutional:      Appearance: Normal appearance.  HENT:     Head: Normocephalic and atraumatic.     Mouth/Throat:     Pharynx: Oropharynx is clear.  Eyes:     Pupils: Pupils are  equal, round, and reactive to light.  Cardiovascular:     Rate and Rhythm: Normal rate and regular rhythm.  Pulmonary:     Effort: Pulmonary effort is normal.     Breath sounds: Normal breath sounds.  Abdominal:     General: Abdomen is flat.     Palpations: Abdomen is soft.  Musculoskeletal:        General: Normal range of motion.  Skin:    General: Skin is warm and dry.  Neurological:     General: No focal deficit present.  Mental Status: He is alert. Mental status is at baseline.  Psychiatric:        Attention and Perception: Attention normal.        Mood and Affect: Mood normal.        Speech: Speech normal.        Behavior: Behavior is cooperative.        Thought Content: Thought content normal.        Cognition and Memory: Cognition normal.    Review of Systems  Constitutional: Negative.   HENT: Negative.    Eyes: Negative.   Respiratory: Negative.    Cardiovascular: Negative.   Gastrointestinal: Negative.   Musculoskeletal: Negative.   Skin: Negative.   Neurological: Negative.   Psychiatric/Behavioral: Negative.     Blood pressure 128/87, pulse 91, temperature 98.3 F (36.8 C), temperature source Oral, resp. rate 20, height 6\' 2"  (1.88 m), weight 100 kg, SpO2 100 %. Body mass index is 28.31 kg/m.   Treatment Plan Summary: Medication management and Plan plan to administer long-acting injectable of Invega tomorrow a few days early.  Possible length of stay within 1 to 2 days  , MD 05/01/2022, 3:56 PM

## 2022-05-01 NOTE — Progress Notes (Signed)
Patient calm and pleasant during assessment denying SI/HI/AVH. Pt observed interacting appropriately with staff and peers on the unit. Pt compliant with medication administration per MD orders. Pt given education, support, and encouragement to be active in his treatment plan. Pt being monitored Q 15 minutes for safety per unit protocol, remains safe on the unit  

## 2022-05-01 NOTE — Progress Notes (Signed)
Recreation Therapy Notes   Date: 05/01/2022  Time: 10:45 am   Location: Craft room       Behavioral response: N/A   Intervention Topic: Relaxation   Discussion/Intervention: Patient refused to attend group.   Clinical Observations/Feedback:  Patient refused to attend group.    Titianna Loomis LRT/CTRS         Layza Summa 05/01/2022 12:06 PM

## 2022-05-01 NOTE — Group Note (Signed)
[facility] LCSW Group Therapy Note   Group Date: 05/01/2022 Start Time: 6226 End Time: 1415   Type of Therapy/Topic:  Group Therapy:  Emotion Regulation  Participation Level:  Minimal    Description of Group:    The purpose of this group is to assist patients in learning to regulate negative emotions and experience positive emotions. Patients will be guided to discuss ways in which they have been vulnerable to their negative emotions. These vulnerabilities will be juxtaposed with experiences of positive emotions or situations, and patients challenged to use positive emotions to combat negative ones. Special emphasis will be placed on coping with negative emotions in conflict situations, and patients will process healthy conflict resolution skills.  Therapeutic Goals: Patient will identify two positive emotions or experiences to reflect on in order to balance out negative emotions:  Patient will label two or more emotions that they find the most difficult to experience:  Patient will be able to demonstrate positive conflict resolution skills through discussion or role plays:   Summary of Patient Progress: Patient was present in group intermittently. He participated in the icebreaker but not in the bigger group discussion. Pt would laugh at inappropriate times and spent much of the class walking from the room only to return a bit later. He appeared to be responding to internal stimulation.    Therapeutic Modalities:   Cognitive Behavioral Therapy Feelings Identification Dialectical Behavioral Therapy   Shirl Harris, LCSW

## 2022-05-01 NOTE — Plan of Care (Signed)
Patient visible in the milieu.Pacing in the hallway and talking to himself at times. Denies SI,HI and AVH. Patient rated his depression and anxiety 0/10. Appetite and energy level good. ADLs maintained. Compliant with medications. Support and encouragement given.

## 2022-05-01 NOTE — [facility] Group Notes (Signed)
[facility] Group Notes:  (Nursing/MHT/Case Management/Adjunct)  Date:  05/01/2022  Time:  9:39 AM  Type of Therapy:   community meeting  Participation Level:  Did Not Attend    Ardene Remley P Fate Caster 05/01/2022, 9:39 AM 

## 2022-05-02 ENCOUNTER — Other Ambulatory Visit: Payer: Self-pay

## 2022-05-02 MED ORDER — HALOPERIDOL 5 MG PO TABS
5.0000 mg | ORAL_TABLET | Freq: Every day | ORAL | 0 refills | Status: DC
  Filled 2022-05-02: qty 10, 10d supply, fill #0

## 2022-05-02 MED ORDER — HALOPERIDOL 5 MG PO TABS: 5.0000 mg | ORAL_TABLET | Freq: Every day | ORAL | 1 refills | Status: DC

## 2022-05-02 MED ORDER — PALIPERIDONE ER 3 MG PO TB24
6.0000 mg | ORAL_TABLET | Freq: Every day | ORAL | 0 refills | Status: DC
  Filled 2022-05-02: qty 20, 10d supply, fill #0

## 2022-05-02 MED ORDER — HYDROXYZINE HCL 50 MG PO TABS: 50.0000 mg | ORAL_TABLET | Freq: Three times a day (TID) | ORAL | 1 refills | Status: DC | PRN

## 2022-05-02 MED ORDER — TRAZODONE HCL 100 MG PO TABS: 100.0000 mg | ORAL_TABLET | Freq: Every day | ORAL | 1 refills | Status: DC

## 2022-05-02 MED ORDER — TRAZODONE HCL 100 MG PO TABS
100.0000 mg | ORAL_TABLET | Freq: Every day | ORAL | 0 refills | Status: DC
  Filled 2022-05-02: qty 10, 10d supply, fill #0

## 2022-05-02 MED ORDER — DIVALPROEX SODIUM ER 500 MG PO TB24
500.0000 mg | ORAL_TABLET | Freq: Two times a day (BID) | ORAL | 0 refills | Status: DC
  Filled 2022-05-02: qty 20, 10d supply, fill #0

## 2022-05-02 MED ORDER — AMLODIPINE BESYLATE 10 MG PO TABS: 10.0000 mg | ORAL_TABLET | Freq: Every day | ORAL | 1 refills | Status: DC

## 2022-05-02 MED ORDER — PALIPERIDONE PALMITATE ER 234 MG/1.5ML IM SUSY: 234.0000 mg | PREFILLED_SYRINGE | INTRAMUSCULAR | 1 refills | Status: DC

## 2022-05-02 MED ORDER — PALIPERIDONE ER 6 MG PO TB24: 6.0000 mg | ORAL_TABLET | Freq: Every day | ORAL | 1 refills | Status: DC

## 2022-05-02 MED ORDER — DIVALPROEX SODIUM ER 500 MG PO TB24: 500.0000 mg | ORAL_TABLET | Freq: Two times a day (BID) | ORAL | 1 refills | Status: DC

## 2022-05-02 NOTE — Progress Notes (Signed)
Patient finally came up to take his scheduled medication. This Probation officer asked patient if we could re-check his BP, being that it was 98/57. Patient stated "they already checked it twice".

## 2022-05-02 NOTE — Plan of Care (Signed)
D- Patient alert and oriented. Patient presented in a pleasant mood on assessment reporting that he slept "good" last night and had no complaints to voice to this Probation officer. Patient denied SI, HI, AVH, and pain at this time. Patient also denied any signs/symptoms of depression/anxiety stating that overall, he is feeling "good". Patient had no stated goals for today.  A- Scheduled medications administered to patient, per MD orders. Support and encouragement provided.  Routine safety checks conducted every 15 minutes.  Patient informed to notify staff with problems or concerns.  R- No adverse drug reactions noted. Patient contracts for safety at this time. Patient compliant with medications. Patient receptive, calm, and cooperative. Patient isolates to room, except for meals and medication. Patient also paces the unit at times, but has no displayed any behavioral issues. Patient remains safe at this time.  Problem: Education: Goal: Knowledge of General Education information will improve Description: Including pain rating scale, medication(s)/side effects and non-pharmacologic comfort measures Outcome: Progressing   Problem: Health Behavior/Discharge Planning: Goal: Ability to manage health-related needs will improve Outcome: Progressing   Problem: Clinical Measurements: Goal: Ability to maintain clinical measurements within normal limits will improve Outcome: Progressing Goal: Will remain free from infection Outcome: Progressing Goal: Diagnostic test results will improve Outcome: Progressing Goal: Respiratory complications will improve Outcome: Progressing Goal: Cardiovascular complication will be avoided Outcome: Progressing   Problem: Activity: Goal: Risk for activity intolerance will decrease Outcome: Progressing   Problem: Nutrition: Goal: Adequate nutrition will be maintained Outcome: Progressing   Problem: Coping: Goal: Level of anxiety will decrease Outcome: Progressing    Problem: Elimination: Goal: Will not experience complications related to bowel motility Outcome: Progressing Goal: Will not experience complications related to urinary retention Outcome: Progressing   Problem: Pain Managment: Goal: General experience of comfort will improve Outcome: Progressing   Problem: Safety: Goal: Ability to remain free from injury will improve Outcome: Progressing   Problem: Skin Integrity: Goal: Risk for impaired skin integrity will decrease Outcome: Progressing

## 2022-05-02 NOTE — Group Note (Signed)

## 2022-05-02 NOTE — Progress Notes (Signed)
Patient's BP is slightly low at 98/57. MD was notified during progression rounds and still requested that this writer administer patient's scheduled BP medication due to a history of HTN. Staff will continue to monitor for safety. Patient remains safe on the unit.

## 2022-05-02 NOTE — Plan of Care (Signed)
Pt denies anxiety/depression at this time. Pt denies SI/HI/AVH or pain at this time. Pt is calm and cooperative. Pt is medication compliant. Pt provided with support and encouragement. Pt monitored q15 minutes for safety per unit policy. Plan of care ongoing.    Pt share he had a good day. He shares that he went outside today and attended group.   Problem: Education: Goal: Knowledge of General Education information will improve Description: Including pain rating scale, medication(s)/side effects and non-pharmacologic comfort measures Outcome: Progressing   Problem: Activity: Goal: Risk for activity intolerance will decrease Outcome: Progressing

## 2022-05-02 NOTE — Progress Notes (Signed)
This writer went to retrieve patient for morning medication. Patient stated "there's so much to do, I gotta get my vitas done, I gotta get medicine. Can I just have five minutes to rest"? This Probation officer will administer scheduled medications once he comes up to get them. MD will be notified.

## 2022-05-02 NOTE — Progress Notes (Signed)
[facility] MD Progress Note  05/02/2022 2:12 PM [name] [name]  MRN:  [id number] Subjective: Follow-up for this patient with schizophrenia.  Patient has no new complaints.  Mood is reported as good.  Not acting out no aggressive behavior good self-care Principal Problem: Schizophrenia, undifferentiated ([place]) Diagnosis: Principal Problem:   Schizophrenia, undifferentiated (HCC) Active Problems:   Hypertension  Total Time spent with patient: 30 minutes  Past Psychiatric History: Past history of schizophrenia and multiple recent hospitalizations  Past Medical History:  Past Medical History:  Diagnosis Date   Asthma    Depression    Psychosis ([place])    Schizoaffective disorder ([place])     Past Surgical History:  Procedure Laterality Date   BACK SURGERY     I&D groin  2017   Family History: History reviewed. No pertinent family history. Family Psychiatric  History: See previous Social History:  Social History   Substance and Sexual Activity  Alcohol Use No     Social History   Substance and Sexual Activity  Drug Use Yes   Types: Marijuana   Comment: reports cannabis use as funds permit    Social History   Socioeconomic History   Marital status: Single    Spouse name: Not on file   Number of children: Not on file   Years of education: Not on file   Highest education level: Not on file  Occupational History   Not on file  Tobacco Use   Smoking status: Every Day    Packs/day: 0.25    Types: Cigarettes   Smokeless tobacco: Never  Vaping Use   Vaping Use: Some days  Substance and Sexual Activity   Alcohol use: No   Drug use: Yes    Types: Marijuana    Comment: reports cannabis use as funds permit   Sexual activity: Not Currently    Partners: Female  Other Topics Concern   Not on file  Social History Narrative   Not on file   Social Determinants of Health   Financial Resource Strain: Not on file  Food Insecurity: No Food Insecurity (04/27/2022)   Hunger Vital  Sign    Worried About Running Out of Food in the Last Year: Never true    Ran Out of Food in the Last Year: Never true  Transportation Needs: No Transportation Needs (04/27/2022)   PRAPARE - Hydrologist (Medical): No    Lack of Transportation (Non-Medical): No  Physical Activity: Not on file  Stress: Not on file  Social Connections: Not on file   Additional Social History:                         Sleep: Fair  Appetite:  Fair  Current Medications: Current Facility-Administered Medications  Medication Dose Route Frequency Provider Last Rate Last Admin   acetaminophen (TYLENOL) tablet 650 mg  650 mg Oral Q6H PRN Athalene Kolle T, MD       alum & mag hydroxide-simeth (MAALOX/MYLANTA) 200-200-20 MG/5ML suspension 30 mL  30 mL Oral Q4H PRN Maicee Ullman T, MD       amLODipine (NORVASC) tablet 10 mg  10 mg Oral Daily Eldred Lievanos T, MD   10 mg at 05/02/22 0927   divalproex (DEPAKOTE ER) 24 hr tablet 500 mg  500 mg Oral BID Arthor Gorter T, MD   500 mg at 05/02/22 [id number]   haloperidol (HALDOL) tablet 5 mg  5 mg Oral QHS Bronx Brogden  T, MD   5 mg at 05/01/22 2107   hydrOXYzine (ATARAX) tablet 50 mg  50 mg Oral TID PRN Emera Bussie, Madie Reno, MD   50 mg at 05/01/22 2107   magnesium hydroxide (MILK OF MAGNESIA) suspension 30 mL  30 mL Oral Daily PRN Valbona Slabach, Madie Reno, MD       paliperidone (INVEGA SUSTENNA) injection 234 mg  234 mg Intramuscular Q28 days Zaelynn Fuchs T, MD   234 mg at 05/02/22 1210   paliperidone (INVEGA) 24 hr tablet 6 mg  6 mg Oral Daily Yosgar Demirjian, Madie Reno, MD   6 mg at 05/02/22 7035   traZODone (DESYREL) tablet 100 mg  100 mg Oral QHS Romyn Boswell, Madie Reno, MD   100 mg at 05/01/22 2107    Lab Results: No results found for this or any previous visit (from the past 48 hour(s)).  Blood Alcohol level:  Lab Results  Component Value Date   ETH <10 04/26/2022   ETH <10 00/93/8182    Metabolic Disorder Labs: Lab Results  Component Value Date   HGBA1C  5.1 12/03/2021   MPG 99.67 12/03/2021   MPG 91.06 07/19/2020   No results found for: "PROLACTIN" Lab Results  Component Value Date   CHOL 197 12/06/2021   TRIG 111 12/06/2021   HDL 46 12/06/2021   CHOLHDL 4.3 12/06/2021   VLDL 22 12/06/2021   LDLCALC 129 (H) 12/06/2021   LDLCALC 177 (H) 07/19/2020    Physical Findings: AIMS:  , ,  ,  ,    CIWA:    COWS:     Musculoskeletal: Strength & Muscle Tone: within normal limits Gait & Station: normal Patient leans: N/A  Psychiatric Specialty Exam:  Presentation  General Appearance:  Casual; Well Groomed  Eye Contact: Good  Speech: Slow  Speech Volume: Decreased  Handedness: Right   Mood and Affect  Mood: Anxious  Affect: Inappropriate; Constricted   Thought Process  Thought Processes: Disorganized  Descriptions of Associations:Circumstantial  Orientation:Full (Time, Place and Person)  Thought Content:Paranoid Ideation; Scattered; Illogical  History of Schizophrenia/Schizoaffective disorder:Yes  Duration of Psychotic Symptoms:Greater than six months  Hallucinations:No data recorded Ideas of Reference:Paranoia; Delusions  Suicidal Thoughts:No data recorded Homicidal Thoughts:No data recorded  Sensorium  Memory: Immediate Poor; Recent Poor; Remote Poor  Judgment: Poor  Insight: Poor   Executive Functions  Concentration: Fair  Attention Span: Poor  Recall: Poor  Fund of Knowledge: Poor  Language: Poor   Psychomotor Activity  Psychomotor Activity:No data recorded  Assets  Assets: Communication Skills; Desire for Improvement; Resilience; Social Support   Sleep  Sleep:No data recorded   Physical Exam: Physical Exam Vitals reviewed.  Constitutional:      Appearance: Normal appearance.  HENT:     Head: Normocephalic and atraumatic.     Mouth/Throat:     Pharynx: Oropharynx is clear.  Eyes:     Pupils: Pupils are equal, round, and reactive to light.   Cardiovascular:     Rate and Rhythm: Normal rate and regular rhythm.  Pulmonary:     Effort: Pulmonary effort is normal.     Breath sounds: Normal breath sounds.  Abdominal:     General: Abdomen is flat.     Palpations: Abdomen is soft.  Musculoskeletal:        General: Normal range of motion.  Skin:    General: Skin is warm and dry.  Neurological:     General: No focal deficit present.     Mental Status: He is  alert. Mental status is at baseline.  Psychiatric:        Attention and Perception: Attention normal.        Mood and Affect: Mood normal.        Speech: Speech normal.        Behavior: Behavior normal.        Thought Content: Thought content normal.        Cognition and Memory: Cognition normal.        Judgment: Judgment normal.    Review of Systems  Constitutional: Negative.   HENT: Negative.    Eyes: Negative.   Respiratory: Negative.    Cardiovascular: Negative.   Gastrointestinal: Negative.   Musculoskeletal: Negative.   Skin: Negative.   Neurological: Negative.   Psychiatric/Behavioral: Negative.     Blood pressure (!) 98/57, pulse 81, temperature 98.5 F (36.9 C), resp. rate 20, height 6\' 2"  (1.88 m), weight 100 kg, SpO2 99 %. Body mass index is 28.31 kg/m.   Treatment Plan Summary: Plan begin plans for discharge tomorrow by requesting a 10-day supply and starting to print prescriptions.  Once again spent time counseling patient on the importance of controlling behavior after discharge and allowing ACT team to work with him.  Alethia Berthold, MD 05/02/2022, 2:12 PM

## 2022-05-03 NOTE — Progress Notes (Signed)
Recreation Therapy Notes     Date: 05/03/2022   Time: 1:30pm    Location: Court yard       Behavioral response: N/A   Intervention Topic: Decision Making    Discussion/Intervention: Patient refused to attend group.    Clinical Observations/Feedback:  Patient refused to attend group.    Kyanna Mahrt LRT/CTRS        Chanda Laperle 05/03/2022 2:03 PM

## 2022-05-03 NOTE — Discharge Summary (Signed)
Physician Discharge Summary Note  Patient:  [name] [name] is an 26 y.o., male MRN:  [id number] DOB:  1995-08-22 Patient phone:  [phone] (home)  Patient address:   [address] [place] [address],  Total Time spent with patient: 30 minutes  Date of Admission:  04/27/2022 Date of Discharge: 05/03/2022  Reason for Admission: Admitted after petition by his ACT team because of aggressive behavior towards one of the ACT team staff coupled with what appeared to be ongoing psychotic symptoms  Principal Problem: Schizophrenia, undifferentiated ([place]) Discharge Diagnoses: Principal Problem:   Schizophrenia, undifferentiated ([place]) Active Problems:   Hypertension   Past Psychiatric History: Past history of schizophrenia with impulsivity and poor self-care some dangerous behavior chronic noncompliance with treatment  Past Medical History:  Past Medical History:  Diagnosis Date   Asthma    Depression    Psychosis ([place])    Schizoaffective disorder ([place])     Past Surgical History:  Procedure Laterality Date   BACK SURGERY     I&D groin  2017   Family History: History reviewed. No pertinent family history. Family Psychiatric  History: See previous Social History:  Social History   Substance and Sexual Activity  Alcohol Use No     Social History   Substance and Sexual Activity  Drug Use Yes   Types: Marijuana   Comment: reports cannabis use as funds permit    Social History   Socioeconomic History   Marital status: Single    Spouse name: Not on file   Number of children: Not on file   Years of education: Not on file   Highest education level: Not on file  Occupational History   Not on file  Tobacco Use   Smoking status: Every Day    Packs/day: 0.25    Types: Cigarettes   Smokeless tobacco: Never  Vaping Use   Vaping Use: Some days  Substance and Sexual Activity   Alcohol use: No   Drug use: Yes    Types: Marijuana    Comment: reports  cannabis use as funds permit   Sexual activity: Not Currently    Partners: Female  Other Topics Concern   Not on file  Social History Narrative   Not on file   Social Determinants of Health   Financial Resource Strain: Not on file  Food Insecurity: No Food Insecurity (04/27/2022)   Hunger Vital Sign    Worried About Running Out of Food in the Last Year: Never true    Ran Out of Food in the Last Year: Never true  Transportation Needs: No Transportation Needs (04/27/2022)   PRAPARE - Hydrologist (Medical): No    Lack of Transportation (Non-Medical): No  Physical Activity: Not on file  Stress: Not on file  Social Connections: Not on file    Hospital Course: Patient was admitted to the psychiatric hospital.  Continue 15-minute checks.  Engaged in individual and group therapy.  Patient was cooperative with medicine and is on both Invega and haloperidol and has accepted the [place] injection.  He has received counseling repeatedly about the importance of being compliant with his ACT team for his own wellbeing and continued stability.  At the time of discharge does not appear to be overtly psychotic and is not showing any signs of current dangerousness  Physical Findings: AIMS:  , ,  ,  ,    CIWA:    COWS:     Musculoskeletal: Strength &  Muscle Tone: within normal limits Gait & Station: normal Patient leans: N/A   Psychiatric Specialty Exam:  Presentation  General Appearance:  Casual; Well Groomed  Eye Contact: Good  Speech: Slow  Speech Volume: Decreased  Handedness: Right   Mood and Affect  Mood: Anxious  Affect: Inappropriate; Constricted   Thought Process  Thought Processes: Disorganized  Descriptions of Associations:Circumstantial  Orientation:Full (Time, Place and Person)  Thought Content:Paranoid Ideation; Scattered; Illogical  History of Schizophrenia/Schizoaffective disorder:Yes  Duration of Psychotic  Symptoms:Greater than six months  Hallucinations:No data recorded Ideas of Reference:Paranoia; Delusions  Suicidal Thoughts:No data recorded Homicidal Thoughts:No data recorded  Sensorium  Memory: Immediate Poor; Recent Poor; Remote Poor  Judgment: Poor  Insight: Poor   Executive Functions  Concentration: Fair  Attention Span: Poor  Recall: Poor  Fund of Knowledge: Poor  Language: Poor   Psychomotor Activity  Psychomotor Activity:No data recorded  Assets  Assets: Communication Skills; Desire for Improvement; Resilience; Social Support   Sleep  Sleep:No data recorded   Physical Exam: Physical Exam Vitals and nursing note reviewed.  Constitutional:      Appearance: Normal appearance.  HENT:     Head: Normocephalic and atraumatic.     Mouth/Throat:     Pharynx: Oropharynx is clear.  Eyes:     Pupils: Pupils are equal, round, and reactive to light.  Cardiovascular:     Rate and Rhythm: Normal rate and regular rhythm.  Pulmonary:     Effort: Pulmonary effort is normal.     Breath sounds: Normal breath sounds.  Abdominal:     General: Abdomen is flat.     Palpations: Abdomen is soft.  Musculoskeletal:        General: Normal range of motion.  Skin:    General: Skin is warm and dry.  Neurological:     General: No focal deficit present.     Mental Status: He is alert. Mental status is at baseline.  Psychiatric:        Attention and Perception: Attention normal.        Mood and Affect: Mood normal.        Speech: Speech normal.        Behavior: Behavior is cooperative.        Thought Content: Thought content normal.        Cognition and Memory: Cognition normal.    Review of Systems  Constitutional: Negative.   HENT: Negative.    Eyes: Negative.   Respiratory: Negative.    Cardiovascular: Negative.   Gastrointestinal: Negative.   Musculoskeletal: Negative.   Skin: Negative.   Neurological: Negative.   Psychiatric/Behavioral: Negative.      Blood pressure 114/80, pulse 84, temperature 97.7 F (36.5 C), temperature source Oral, resp. rate 20, height 6\' 2"  (1.88 m), weight 100 kg, SpO2 100 %. Body mass index is 28.31 kg/m.   Social History   Tobacco Use  Smoking Status Every Day   Packs/day: 0.25   Types: Cigarettes  Smokeless Tobacco Never   Tobacco Cessation:  A prescription for an FDA-approved tobacco cessation medication was offered at discharge and the patient refused   Blood Alcohol level:  Lab Results  Component Value Date   [facility] <10 04/26/2022   ETH <10 04/12/2022    Metabolic Disorder Labs:  Lab Results  Component Value Date   HGBA1C 5.1 12/03/2021   MPG 99.67 12/03/2021   MPG 91.06 07/19/2020   No results found for: "PROLACTIN" Lab Results  Component Value Date  CHOL 197 12/06/2021   TRIG 111 12/06/2021   HDL 46 12/06/2021   CHOLHDL 4.3 12/06/2021   VLDL 22 12/06/2021   LDLCALC 129 (H) 12/06/2021   LDLCALC 177 (H) 07/19/2020    See Psychiatric Specialty Exam and Suicide Risk Assessment completed by Attending Physician prior to discharge.  Discharge destination:  Home  Is patient on multiple antipsychotic therapies at discharge:  Yes,   Do you recommend tapering to monotherapy for antipsychotics?  No   Has Patient had three or more failed trials of antipsychotic monotherapy by history:  Yes,   Antipsychotic medications that previously failed include:   1.  Clozapine., 2.  Hinda Glatter., and 3.  Haloperidol.  Recommended Plan for Multiple Antipsychotic Therapies: Additional reason(s) for multiple antispychotic treatment:  Patient is only doing well on his mixture of medications and is tolerating them well under which circumstance I see no reason to be risking his stability by decreasing one of the other of the medicines  Discharge Instructions     Diet - low sodium heart healthy   Complete by: As directed    Increase activity slowly   Complete by: As directed       Allergies as of  05/03/2022   No Known Allergies      Medication List     STOP taking these medications    cloZAPine 100 MG tablet Commonly known as: CLOZARIL       TAKE these medications      Indication  amLODipine 10 MG tablet Commonly known as: NORVASC Take 1 tablet (10 mg total) by mouth daily.  Indication: High Blood Pressure Disorder   divalproex 500 MG 24 hr tablet Commonly known as: DEPAKOTE ER Take 1 tablet (500 mg total) by mouth 2 (two) times daily.  Indication: Manic Phase of Manic-Depression   haloperidol 5 MG tablet Commonly known as: HALDOL Take 1 tablet (5 mg total) by mouth at bedtime.  Indication: Psychosis   hydrOXYzine 50 MG tablet Commonly known as: ATARAX Take 1 tablet (50 mg total) by mouth 3 (three) times daily as needed for anxiety.  Indication: Feeling Tense   paliperidone 234 MG/1.5ML injection Commonly known as: INVEGA SUSTENNA Inject 234 mg into the muscle every 28 (twenty-eight) days. Start taking on: May 30, 2022  Indication: Schizoaffective Disorder, Next dose due on 08/21/2020   paliperidone 3 MG 24 hr tablet Commonly known as: INVEGA Take 2 tablets (6 mg total) by mouth daily. What changed: medication strength  Indication: Schizophrenia   traZODone 100 MG tablet Commonly known as: DESYREL Take 1 tablet (100 mg total) by mouth at bedtime.  Indication: Trouble Sleeping         Follow-up recommendations:  Other:  Follow-up with his ACT team.  Stay on medicine.  Avoid all intoxicating drugs  Comments: See previous  Signed: Mordecai Rasmussen, MD 05/03/2022, 10:42 AM

## 2022-05-03 NOTE — Progress Notes (Signed)
Patient ID: [name] [name], male   DOB: 09-10-1995, 26 y.o.   MRN: [id number]  Discharge Note:  Patient denies SI/HI/AVH at this time. Discharge instructions, AVS, prescriptions, medication supply and transition record gone over with patient. Patient agrees to comply with medication management, follow-up visit, and outpatient therapy. Patient belongings returned to patient. Patient questions and concerns addressed and answered. Patient ambulatory off unit. Patient discharged to home via [facility] services.

## 2022-05-03 NOTE — BHH Suicide Risk Assessment (Signed)
[facility] Discharge Suicide Risk Assessment   Principal Problem: Schizophrenia, undifferentiated ([place]) Discharge Diagnoses: Principal Problem:   Schizophrenia, undifferentiated ([place]) Active Problems:   Hypertension   Total Time spent with patient: 30 minutes  Musculoskeletal: Strength & Muscle Tone: within normal limits Gait & Station: normal Patient leans: N/A  Psychiatric Specialty Exam  Presentation  General Appearance:  Casual; Well Groomed  Eye Contact: Good  Speech: Slow  Speech Volume: Decreased  Handedness: Right   Mood and Affect  Mood: Anxious  Duration of Depression Symptoms: N/A  Affect: Inappropriate; Constricted   Thought Process  Thought Processes: Disorganized  Descriptions of Associations:Circumstantial  Orientation:Full (Time, Place and Person)  Thought Content:Paranoid Ideation; Scattered; Illogical  History of Schizophrenia/Schizoaffective disorder:Yes  Duration of Psychotic Symptoms:Greater than six months  Hallucinations:No data recorded Ideas of Reference:Paranoia; Delusions  Suicidal Thoughts:No data recorded Homicidal Thoughts:No data recorded  Sensorium  Memory: Immediate Poor; Recent Poor; Remote Poor  Judgment: Poor  Insight: Poor   Executive Functions  Concentration: Fair  Attention Span: Poor  Recall: Poor  Fund of Knowledge: Poor  Language: Poor   Psychomotor Activity  Psychomotor Activity:No data recorded  Assets  Assets: Communication Skills; Desire for Improvement; Resilience; Social Support   Sleep  Sleep:No data recorded  Physical Exam: Physical Exam Vitals reviewed.  Constitutional:      Appearance: Normal appearance.  HENT:     Head: Normocephalic and atraumatic.     Mouth/Throat:     Pharynx: Oropharynx is clear.  Eyes:     Pupils: Pupils are equal, round, and reactive to light.  Cardiovascular:     Rate and Rhythm: Normal rate and regular rhythm.  Pulmonary:      Effort: Pulmonary effort is normal.     Breath sounds: Normal breath sounds.  Abdominal:     General: Abdomen is flat.     Palpations: Abdomen is soft.  Musculoskeletal:        General: Normal range of motion.  Skin:    General: Skin is warm and dry.  Neurological:     General: No focal deficit present.     Mental Status: He is alert. Mental status is at baseline.  Psychiatric:        Attention and Perception: Attention normal.        Mood and Affect: Mood normal.        Speech: Speech normal.        Behavior: Behavior normal.        Thought Content: Thought content normal.        Cognition and Memory: Cognition normal.    Review of Systems  Constitutional: Negative.   HENT: Negative.    Eyes: Negative.   Respiratory: Negative.    Cardiovascular: Negative.   Gastrointestinal: Negative.   Musculoskeletal: Negative.   Skin: Negative.   Neurological: Negative.   Psychiatric/Behavioral: Negative.     Blood pressure 114/80, pulse 84, temperature 97.7 F (36.5 C), temperature source Oral, resp. rate 20, height 6\' 2"  (1.88 m), weight 100 kg, SpO2 100 %. Body mass index is 28.31 kg/m.  Mental Status Per Nursing Assessment::   On Admission:  NA  Demographic Factors:  Male and Unemployed  Loss Factors: Financial problems/change in socioeconomic status  Historical Factors: Impulsivity  Risk Reduction Factors:   Living with another person, especially a relative, Positive social support, and Positive therapeutic relationship  Continued Clinical Symptoms:  Schizophrenia:   Paranoid or undifferentiated type  Cognitive Features That Contribute To Risk:  None  Suicide Risk:  Minimal: No identifiable suicidal ideation.  Patients presenting with no risk factors but with morbid ruminations; may be classified as minimal risk based on the severity of the depressive symptoms    Plan Of Care/Follow-up recommendations:  Other:  Patient has shown no dangerous behaviors in the  hospital.  Denies suicidal ideation.  Appropriate interaction with staff here on the unit.  Taking medicine well.  Strongly recommend that he cooperate with his ACT team continue medication management.  Mordecai Rasmussen, MD 05/03/2022, 10:39 AM

## 2022-05-03 NOTE — BHH Counselor (Signed)
CSW met with pt briefly to discuss discharge. Pt endorsed plans to return home and stated that he will need transportation there. He plans to continue outpatient treatment through his [facility] team. Pt is a smoker but declined interest in cessation assistance through [facility]. He denied interest in substance use treatment as well. No other concerns expressed. Contact ended without incident.   CSW contacted ACTT team lead, Thayer Headings [phone]) to notify of discharge.   Chalmers Guest. Guerry Bruin, MSW, LCSW, [place] 05/03/2022 2:01 PM

## 2022-05-03 NOTE — Progress Notes (Signed)
D- Patient alert and oriented. Patient presented in a pleasant mood on assessment reporting that he slept good last night and had no complaints to voice to this Probation officer. Patient denied SI, HI, AVH, and pain at this time. Patient also denied any signs/symptoms of depression and anxiety, stating that "I'm ready to go". Patient had no stated goals for today.  A- Scheduled medications administered to patient, per MD orders. Support and encouragement provided.  Routine safety checks conducted every 15 minutes.  Patient informed to notify staff with problems or concerns.  R- No adverse drug reactions noted. Patient contracts for safety at this time. Patient compliant with medications and treatment plan. Patient receptive, calm, and cooperative. Patient remains safe at this time.

## 2022-05-03 NOTE — Progress Notes (Signed)
  [facility] Adult Case Management Discharge Plan :  Will you be returning to the same living situation after discharge:  Yes,  pt plans to return home upon discharge. At discharge, do you have transportation home?: Yes,  CSW to assist with transportation arrangements. Do you have the ability to pay for your medications: Yes,  [employer].  Release of information consent forms completed and in the chart;  Patient's signature needed at discharge.  Patient to Follow up at:  Follow-up [facility].. Call.   Why: ACTT team made aware of discharge. They will follow up with you within the following week. Contact information: [place] [address] [phone]                 Next level of care provider has access to Man and Suicide Prevention discussed: Yes,  SPE completed with pt.     Has patient been referred to the Quitline?: Patient refused referral  Patient has been referred for addiction treatment: Pt. refused referral  Shirl Harris, LCSW 05/03/2022, 2:02 PM

## 2022-05-06 NOTE — BHH Counselor (Signed)
CSW received text from [facility] team lead, Thayer Headings that they had not received pt discharge paperwork. CSW faxed copy of discharge summary to [phone] per request.   Chalmers Guest. Guerry Bruin, MSW, [place], [place] 05/06/2022 10:55 AM

## 2022-05-07 ENCOUNTER — Emergency Department
Admission: EM | Admit: 2022-05-07 | Discharge: 2022-05-07 | Discharge status: Against Medical Advice | Payer: Medicaid Other

## 2022-05-07 NOTE — ED Notes (Signed)
Pt and officers seen leaving ED lobby.  Per security, pt left in police car with officers.

## 2022-05-07 NOTE — ED Notes (Signed)
Pt seen being escorted by [employer] out of [place] and into police vehicle and officer driving pt away. Security called Probation officer to inform. No one at [facility] front desk reports being notified by officer that pt was being taken.

## 2022-05-21 ENCOUNTER — Emergency Department
Admission: EM | Admit: 2022-05-21 | Discharge: 2022-05-22 | Disposition: A | Discharge status: Other Acute Inpatient Hospital | Payer: No Typology Code available for payment source | Attending: Emergency Medicine | Admitting: Emergency Medicine

## 2022-05-21 DIAGNOSIS — I10 Essential (primary) hypertension: Secondary | ICD-10-CM | POA: Insufficient documentation

## 2022-05-21 DIAGNOSIS — F250 Schizoaffective disorder, bipolar type: Secondary | ICD-10-CM | POA: Diagnosis not present

## 2022-05-21 DIAGNOSIS — Z1152 Encounter for screening for COVID-19: Secondary | ICD-10-CM | POA: Insufficient documentation

## 2022-05-21 DIAGNOSIS — F29 Unspecified psychosis not due to a substance or known physiological condition: Secondary | ICD-10-CM | POA: Insufficient documentation

## 2022-05-21 DIAGNOSIS — Z046 Encounter for general psychiatric examination, requested by authority: Secondary | ICD-10-CM | POA: Diagnosis present

## 2022-05-21 LAB — COMPREHENSIVE METABOLIC PANEL
ALT: 22 U/L (ref 0–44)
AST: 21 U/L (ref 15–41)
Albumin: 4.5 g/dL (ref 3.5–5.0)
Alkaline Phosphatase: 81 U/L (ref 38–126)
Anion gap: 8 (ref 5–15)
BUN: 16 mg/dL (ref 6–20)
CO2: 28 mmol/L (ref 22–32)
Calcium: 9 mg/dL (ref 8.9–10.3)
Chloride: 103 mmol/L (ref 98–111)
Creatinine, Ser: 1.22 mg/dL (ref 0.61–1.24)
GFR, Estimated: >60 mL/min (ref >60)
Glucose, Bld: 105 mg/dL — ABNORMAL HIGH (ref 70–99)
Potassium: 4 mmol/L (ref 3.5–5.1)
Sodium: 139 mmol/L (ref 135–145)
Total Bilirubin: 0.6 mg/dL (ref 0.3–1.2)
Total Protein: 7.9 g/dL (ref 6.5–8.1)

## 2022-05-21 LAB — SALICYLATE LEVEL: Salicylate Lvl: <7 mg/dL — ABNORMAL LOW (ref 7.0–30.0)

## 2022-05-21 LAB — CBC
HCT: 47 % (ref 39.0–52.0)
Hemoglobin: 15.8 g/dL (ref 13.0–17.0)
MCH: 27.5 pg (ref 26.0–34.0)
MCHC: 33.6 g/dL (ref 30.0–36.0)
MCV: 81.7 fL (ref 80.0–100.0)
Platelets: 252 10*3/uL (ref 150–400)
RBC: 5.75 MIL/uL (ref 4.22–5.81)
RDW: 12.4 % (ref 11.5–15.5)
WBC: 6.3 10*3/uL (ref 4.0–10.5)
nRBC: 0 % (ref 0.0–0.2)

## 2022-05-21 LAB — RESP PANEL BY RT-PCR (FLU A&B, COVID) ARPGX2
Influenza A by PCR: NEGATIVE
Influenza B by PCR: NEGATIVE
SARS Coronavirus 2 by RT PCR: NEGATIVE

## 2022-05-21 LAB — ETHANOL: Alcohol, Ethyl (B): <10 mg/dL (ref <10)

## 2022-05-21 LAB — ACETAMINOPHEN LEVEL: Acetaminophen (Tylenol), Serum: <10 ug/mL — ABNORMAL LOW (ref 10–30)

## 2022-05-21 LAB — VALPROIC ACID LEVEL: Valproic Acid Lvl: 80 ug/mL (ref 50.0–100.0)

## 2022-05-21 MED ORDER — HYDROXYZINE HCL 25 MG PO TABS: 25.0000 mg | ORAL_TABLET | Freq: Three times a day (TID) | ORAL | Status: DC | PRN

## 2022-05-21 MED ORDER — LORAZEPAM 2 MG/ML IJ SOLN
2.0000 mg | Freq: Once | INTRAMUSCULAR | Status: AC
  Administered 2022-05-21: 2 mg via INTRAMUSCULAR
  Filled 2022-05-21: qty 1

## 2022-05-21 MED ORDER — HALOPERIDOL LACTATE 5 MG/ML IJ SOLN
5.0000 mg | Freq: Once | INTRAMUSCULAR | Status: AC
  Administered 2022-05-21: 5 mg via INTRAMUSCULAR
  Filled 2022-05-21: qty 1

## 2022-05-21 NOTE — ED Notes (Signed)
IVC  CONSULT  DONE  PENDING  PLACEMENT 

## 2022-05-21 NOTE — ED Notes (Signed)
Pt belongings:  1 pair of black shorts 1 black sweater 1 green and black sweater 1 pair of black boots

## 2022-05-21 NOTE — BH Assessment (Signed)
Adult MH  Referral information for Psychiatric Hospitalization faxed to:   [facility] ([phone]-or- [phone]),   [facility] ([phone]),   [facility] ([phone] -or- [phone]),   [facility] ([phone]---[phone]---[phone]),   [facility] ([phone] or [phone])   [facility] ([phone] or [phone]),   [facility] ([phone]) 
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
## 2022-05-21 NOTE — ED Notes (Signed)
Patient took IM medications with no issues.  Security standing by for safety for staff and patient.
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
## 2022-05-21 NOTE — BH Assessment (Signed)
PATIENT BED AVAILABLE AFTER 7AM ON 05/22/22  Patient has been accepted to [facility].  Patient assigned to [place] Accepting physician is Dr. Reece Levy.  Call report to [phone].  Representative was Clara.   ER Staff is aware of it:  [facility] ER Secretary  Dr. Cinda Quest, ER MD  Joelene Millin Patient's Nurse

## 2022-05-21 NOTE — ED Notes (Signed)
Dr Cinda Quest here to see pt now
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
## 2022-05-21 NOTE — ED Notes (Signed)
IVC pending consult   
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
## 2022-05-22 NOTE — ED Notes (Signed)
Patient left calmly in police custody, all belongings sent with him. Report was called.

## 2022-05-22 NOTE — ED Notes (Signed)
Called [facility] for [facility] to transport to [place]

## 2022-05-22 NOTE — ED Provider Notes (Signed)
Emergency Medicine Observation Re-evaluation Note  [name] [name] is a 26 y.o. male, seen on rounds today.  Pt initially presented to the ED for complaints of IVC  Currently, the patient is calm, no acute complaints.  Physical Exam  Blood pressure (!) 144/110, pulse 94, temperature 98.6 F (37 C), temperature source Oral, resp. rate 19, SpO2 100 %. Physical Exam General: NAD Lungs: CTAB Psych: not agitated  ED Course / MDM  EKG:    I have reviewed the labs performed to date as well as medications administered while in observation.  Recent changes in the last 24 hours include no acute events overnight.  Remains medically stable for transport and psych admission.  Plan  Current plan is for transfer to [facility] today. Patient is under full IVC at this time.   Carrie Mew, MD 05/22/22 [phone]

## 2022-05-22 NOTE — BH Assessment (Incomplete)
Received call

## 2022-05-22 NOTE — ED Notes (Signed)
EMTALA reviewed by this RN at this time.  

## 2022-05-22 NOTE — ED Notes (Signed)
IVC/Accepted to [facility] [place]  

## 2022-11-02 DIAGNOSIS — I10 Essential (primary) hypertension: Secondary | ICD-10-CM | POA: Insufficient documentation

## 2022-11-02 DIAGNOSIS — J45909 Unspecified asthma, uncomplicated: Secondary | ICD-10-CM | POA: Insufficient documentation

## 2022-11-02 DIAGNOSIS — Z79899 Other long term (current) drug therapy: Secondary | ICD-10-CM | POA: Insufficient documentation

## 2022-11-02 DIAGNOSIS — Z1152 Encounter for screening for COVID-19: Secondary | ICD-10-CM | POA: Insufficient documentation

## 2022-11-02 DIAGNOSIS — Z7951 Long term (current) use of inhaled steroids: Secondary | ICD-10-CM | POA: Insufficient documentation

## 2022-11-02 DIAGNOSIS — F250 Schizoaffective disorder, bipolar type: Secondary | ICD-10-CM | POA: Insufficient documentation

## 2022-11-02 DIAGNOSIS — R259 Unspecified abnormal involuntary movements: Secondary | ICD-10-CM | POA: Insufficient documentation

## 2022-11-03 ENCOUNTER — Emergency Department (EMERGENCY_DEPARTMENT_HOSPITAL)
Admission: EM | Admit: 2022-11-03 | Discharge: 2022-11-04 | Disposition: A | Discharge status: Other Acute Inpatient Hospital | Payer: No Typology Code available for payment source | Source: Home / Self Care | Attending: Emergency Medicine | Admitting: Emergency Medicine

## 2022-11-03 ENCOUNTER — Other Ambulatory Visit: Payer: Self-pay

## 2022-11-03 DIAGNOSIS — F203 Undifferentiated schizophrenia: Secondary | ICD-10-CM | POA: Diagnosis present

## 2022-11-03 DIAGNOSIS — F250 Schizoaffective disorder, bipolar type: Secondary | ICD-10-CM | POA: Diagnosis present

## 2022-11-03 DIAGNOSIS — I10 Essential (primary) hypertension: Secondary | ICD-10-CM | POA: Diagnosis present

## 2022-11-03 DIAGNOSIS — Z046 Encounter for general psychiatric examination, requested by authority: Secondary | ICD-10-CM

## 2022-11-03 LAB — CBC
HCT: 43.9 % (ref 39.0–52.0)
Hemoglobin: 15 g/dL (ref 13.0–17.0)
MCH: 28 pg (ref 26.0–34.0)
MCHC: 34.2 g/dL (ref 30.0–36.0)
MCV: 81.9 fL (ref 80.0–100.0)
Platelets: 239 10*3/uL (ref 150–400)
RBC: 5.36 MIL/uL (ref 4.22–5.81)
RDW: 13.3 % (ref 11.5–15.5)
WBC: 8.5 10*3/uL (ref 4.0–10.5)
nRBC: 0 % (ref 0.0–0.2)

## 2022-11-03 LAB — COMPREHENSIVE METABOLIC PANEL
ALT: 34 U/L (ref 0–44)
AST: 24 U/L (ref 15–41)
Albumin: 4.6 g/dL (ref 3.5–5.0)
Alkaline Phosphatase: 82 U/L (ref 38–126)
Anion gap: 9 (ref 5–15)
BUN: 15 mg/dL (ref 6–20)
CO2: 23 mmol/L (ref 22–32)
Calcium: 9.3 mg/dL (ref 8.9–10.3)
Chloride: 105 mmol/L (ref 98–111)
Creatinine, Ser: 0.98 mg/dL (ref 0.61–1.24)
GFR, Estimated: >60 mL/min (ref >60)
Glucose, Bld: 87 mg/dL (ref 70–99)
Potassium: 3.6 mmol/L (ref 3.5–5.1)
Sodium: 137 mmol/L (ref 135–145)
Total Bilirubin: 1 mg/dL (ref 0.3–1.2)
Total Protein: 8 g/dL (ref 6.5–8.1)

## 2022-11-03 LAB — ACETAMINOPHEN LEVEL: Acetaminophen (Tylenol), Serum: <10 ug/mL — ABNORMAL LOW (ref 10–30)

## 2022-11-03 LAB — RESP PANEL BY RT-PCR (RSV, FLU A&B, COVID)  RVPGX2
Influenza A by PCR: NEGATIVE
Influenza B by PCR: NEGATIVE
Resp Syncytial Virus by PCR: NEGATIVE
SARS Coronavirus 2 by RT PCR: NEGATIVE

## 2022-11-03 LAB — ETHANOL: Alcohol, Ethyl (B): <10 mg/dL (ref <10)

## 2022-11-03 LAB — SALICYLATE LEVEL: Salicylate Lvl: <7 mg/dL — ABNORMAL LOW (ref 7.0–30.0)

## 2022-11-03 LAB — VALPROIC ACID LEVEL: Valproic Acid Lvl: 54 ug/mL (ref 50.0–100.0)

## 2022-11-03 MED ORDER — PALIPERIDONE ER 3 MG PO TB24
6.0000 mg | ORAL_TABLET | Freq: Every day | ORAL | Status: DC
  Administered 2022-11-03 – 2022-11-04 (×2): 6 mg via ORAL
  Filled 2022-11-03 (×2): qty 2

## 2022-11-03 MED ORDER — HYDROXYZINE HCL 25 MG PO TABS
50.0000 mg | ORAL_TABLET | Freq: Three times a day (TID) | ORAL | Status: DC | PRN
  Administered 2022-11-03 – 2022-11-04 (×3): 50 mg via ORAL
  Filled 2022-11-03 (×3): qty 2

## 2022-11-03 MED ORDER — AMLODIPINE BESYLATE 5 MG PO TABS
10.0000 mg | ORAL_TABLET | Freq: Every day | ORAL | Status: DC
  Administered 2022-11-03 – 2022-11-04 (×2): 10 mg via ORAL
  Filled 2022-11-03 (×2): qty 2

## 2022-11-03 MED ORDER — DIVALPROEX SODIUM ER 500 MG PO TB24
500.0000 mg | ORAL_TABLET | Freq: Two times a day (BID) | ORAL | Status: DC
  Administered 2022-11-03 – 2022-11-04 (×3): 500 mg via ORAL
  Filled 2022-11-03: qty 1
  Filled 2022-11-03: qty 2
  Filled 2022-11-03: qty 1

## 2022-11-03 MED ORDER — TRAZODONE HCL 100 MG PO TABS
100.0000 mg | ORAL_TABLET | Freq: Every day | ORAL | Status: DC
  Administered 2022-11-03 (×2): 100 mg via ORAL
  Filled 2022-11-03 (×2): qty 1

## 2022-11-03 MED ORDER — CALCIUM CARBONATE ANTACID 500 MG PO CHEW
400.0000 mg | CHEWABLE_TABLET | ORAL | Status: DC | PRN
  Administered 2022-11-03: 400 mg via ORAL
  Filled 2022-11-03: qty 2

## 2022-11-03 MED ORDER — HALOPERIDOL 5 MG PO TABS
5.0000 mg | ORAL_TABLET | Freq: Every day | ORAL | Status: DC
  Administered 2022-11-03 (×2): 5 mg via ORAL
  Filled 2022-11-03 (×2): qty 1

## 2022-11-03 NOTE — ED Notes (Signed)
IVC/pending psych inpatient admission when medically cleared. 
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
## 2022-11-03 NOTE — Consult Note (Signed)
Client continues to warrant admission based on his psychosis, sleeping soundly on rounding, will continue to seek inpatient hospitalization.  Nanine Means, PMHNP

## 2022-11-03 NOTE — ED Notes (Signed)
Hospital meal provided, pt tolerated w/o complaints.  Waste discarded appropriately.  

## 2022-11-03 NOTE — ED Notes (Signed)
Ice cream given as snack per patient request.
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
## 2022-11-03 NOTE — ED Notes (Addendum)
Pt had went to the bathroom and turn on the shower. Water was turned off by this Clinical research associate

## 2022-11-03 NOTE — ED Notes (Signed)
Pt dressed out by this Chartered certified accountant. Pt belongings placed in belongings bag  Blue hat [employer] Black sweatpants Black pair of crocs Black jacket

## 2022-11-03 NOTE — ED Notes (Signed)
Pt given dinner tray and beverage  

## 2022-11-03 NOTE — BH Assessment (Addendum)
Per [facility] AC Alcario Drought), patient to be referred out of system.  Referral information for Psychiatric Hospitalization faxed to;   [name] [phone]) No beds available  [name] ([phone]),   [name] [phone]), No beds available  [facility] [phone]),   [name] [phone]- [phone]),   [facility] [phone])  [facility] [phone])

## 2022-11-03 NOTE — ED Notes (Signed)
Lunch tray and beverage given to pt.

## 2022-11-03 NOTE — ED Notes (Signed)
Pt still pacing around dayroom, pt is calm and seems relaxed.
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
## 2022-11-03 NOTE — ED Notes (Signed)
TTS and Psych NP speaking with patient. 

## 2022-11-03 NOTE — Consult Note (Signed)
[facility] Face-to-Face Psychiatry Consult   Reason for Consult:IVC    Referring Physician: Dr. Elesa Massed Patient Identification: [name] [name] MRN:  [id number] Principal Diagnosis: <principal problem not specified> Diagnosis:  Active Problems:   Schizoaffective disorder, bipolar type   Hypertension   Schizophrenia, undifferentiated   Total Time spent with patient: 1 hour  Subjective: "Can I go downstairs?"  [name] [name] is a 27 y.o. male patient presented to [facility] ED via law enforcement under involuntary commitment status (IVC). Per the IVC paperwork taken out by the [employer], "Law enforcement was notified of a man laying in the middle of the road on [address].  Law enforcement located the respondent lying 2 feet from the road on the grassy shoulder.  When questioned, he simply stated "death."  He jumped up and ran across [address] )a five-lane road).  Officers followed him at which time he ran back across the roadway.  Once to the other side, he told law enforcement several times, "Kill me, kill me."  Respondent has a history of IVC."  The patient was seen face-to-face by this provider; the chart was reviewed and consulted with Dr. Elesa Massed on 11/03/2022 due to the patient's care. It was discussed with the EDP that the patient does meet the criteria to be admitted to the psychiatric inpatient unit.  On evaluation the patient is alert and oriented x4, calm and cooperative, and mood-congruent with affect. The patient does not appear to be responding to internal or external stimuli. Neither is the patient presenting with any delusional thinking. The patient denies auditory or visual hallucinations. The patient denies any suicidal, homicidal, or self-harm ideations. The patient is not presenting with any psychotic or paranoid behaviors.   Disposition: Patient does meet the criteria for psychiatric inpatient admission  HPI: Per Dr. Elesa Massed, [name] [name] is a 27 y.o. male  history of schizoaffective disorder who presents to the emergency department under IVC.   Per IVC paperwork taken out by [employer] "law enforcement was notified of a man laying in the middle of the road on [address].  Law enforcement located the respondent lying 2 feet from the road in the grassy shoulder.  When questioned he simply stated "death".  He jumped up and ran across [address] )a five lane road).  Officers followed him at which time he ran back across the roadway.  Once to the other side he told law enforcement several times "kill me, kill me".  Respondent has a history of IVC."   Patient is asked what happened and why he is here he states that he has not slept in 4 to 5 days because he has been playing video games.  He denies any drug or alcohol use.  Denies SI or HI.  Not responding to internal stimuli.  When asked why he was running across the street he states "I do not run".  When asked why he told the police to kill him he states "killing me softly".   History provided by patient, police.  Past Psychiatric History:  Depression Psychosis Schizoaffective disorder   Risk to Self:   Risk to Others:   Prior Inpatient Therapy:   Prior Outpatient Therapy:    Past Medical History:  Past Medical History:  Diagnosis Date   Asthma    Depression    Psychosis    Schizoaffective disorder     Past Surgical History:  Procedure Laterality Date   BACK SURGERY  I&D groin  2017   Family History: History reviewed. No pertinent family history. Family Psychiatric  History: History reviewed. No pertinent family psychiatric history Social History:  Social History   Substance and Sexual Activity  Alcohol Use No     Social History   Substance and Sexual Activity  Drug Use Yes   Types: Marijuana   Comment: reports cannabis use as funds permit    Social History   Socioeconomic History   Marital status: Single    Spouse name: Not on file   Number  of children: Not on file   Years of education: Not on file   Highest education level: Not on file  Occupational History   Not on file  Tobacco Use   Smoking status: Every Day    Packs/day: .25    Types: Cigarettes   Smokeless tobacco: Never  Vaping Use   Vaping Use: Some days  Substance and Sexual Activity   Alcohol use: No   Drug use: Yes    Types: Marijuana    Comment: reports cannabis use as funds permit   Sexual activity: Not Currently    Partners: Female  Other Topics Concern   Not on file  Social History Narrative   Not on file   Social Determinants of Health   Financial Resource Strain: Not on file  Food Insecurity: No Food Insecurity (04/27/2022)   Hunger Vital Sign    Worried About Running Out of Food in the Last Year: Never true    Ran Out of Food in the Last Year: Never true  Transportation Needs: No Transportation Needs (04/27/2022)   PRAPARE - Administrator, Civil Service (Medical): No    Lack of Transportation (Non-Medical): No  Physical Activity: Not on file  Stress: Not on file  Social Connections: Not on file   Additional Social History:    Allergies:  No Known Allergies  Labs:  Results for orders placed or performed during the hospital encounter of 11/03/22 (from the past 48 hour(s))  Comprehensive metabolic panel     Status: None   Collection Time: 11/03/22 12:15 AM  Result Value Ref Range   Sodium 137 135 - 145 mmol/L   Potassium 3.6 3.5 - 5.1 mmol/L   Chloride 105 98 - 111 mmol/L   CO2 23 22 - 32 mmol/L   Glucose, Bld 87 70 - 99 mg/dL    Comment: Glucose reference range applies only to samples taken after fasting for at least 8 hours.   BUN 15 6 - 20 mg/dL   Creatinine, Ser 1.61 0.61 - 1.24 mg/dL   Calcium 9.3 8.9 - 09.6 mg/dL   Total Protein 8.0 6.5 - 8.1 g/dL   Albumin 4.6 3.5 - 5.0 g/dL   AST 24 15 - 41 U/L   ALT 34 0 - 44 U/L   Alkaline Phosphatase 82 38 - 126 U/L   Total Bilirubin 1.0 0.3 - 1.2 mg/dL   GFR, Estimated  >04 >54 mL/min    Comment: (NOTE) Calculated using the CKD-EPI Creatinine Equation (2021)    Anion gap 9 5 - 15    Comment: Performed at [facility], [address] [place], [place] [address]  Ethanol     Status: None   Collection Time: 11/03/22 12:15 AM  Result Value Ref Range   Alcohol, Ethyl (B) <10 <10 mg/dL    Comment: (NOTE) Lowest detectable limit for serum alcohol is 10 mg/dL.  For medical purposes only. Performed at  [facility], [address] [place], [place] [address]   Salicylate level     Status: Abnormal   Collection Time: 11/03/22 12:15 AM  Result Value Ref Range   Salicylate Lvl <7.0 (L) 7.0 - 30.0 mg/dL    Comment: Performed at [facility], [address] [place], [place] [address]  Acetaminophen level     Status: Abnormal   Collection Time: 11/03/22 12:15 AM  Result Value Ref Range   Acetaminophen (Tylenol), Serum <10 (L) 10 - 30 ug/mL    Comment: (NOTE) Therapeutic concentrations vary significantly. A range of 10-30 ug/mL  may be an effective concentration for many patients. However, some  are best treated at concentrations outside of this range. Acetaminophen concentrations >150 ug/mL at 4 hours after ingestion  and >50 ug/mL at 12 hours after ingestion are often associated with  toxic reactions.  Performed at [facility], [address] [place], [place] [address]   cbc     Status: None   Collection Time: 11/03/22 12:15 AM  Result Value Ref Range   WBC 8.5 4.0 - 10.5 K/uL   RBC 5.36 4.22 - 5.81 MIL/uL   Hemoglobin 15.0 13.0 - 17.0 g/dL   HCT 91.4 78.2 - 95.6 %   MCV 81.9 80.0 - 100.0 fL   MCH 28.0 26.0 - 34.0 pg   MCHC 34.2 30.0 - 36.0 g/dL   RDW 21.3 08.6 - 57.8 %   Platelets 239 150 - 400 K/uL   nRBC 0.0 0.0 - 0.2 %    Comment: Performed at [facility], [address] [place], [place] [address]  Resp panel by RT-PCR (RSV, Flu A&B, Covid) Anterior Nasal Swab     Status: None    Collection Time: 11/03/22 12:15 AM   Specimen: Anterior Nasal Swab  Result Value Ref Range   SARS Coronavirus 2 by RT PCR NEGATIVE NEGATIVE    Comment: (NOTE) SARS-CoV-2 target nucleic acids are NOT DETECTED.  The SARS-CoV-2 RNA is generally detectable in upper respiratory specimens during the acute phase of infection. The lowest concentration of SARS-CoV-2 viral copies this assay can detect is 138 copies/mL. A negative result does not preclude SARS-Cov-2 infection and should not be used as the sole basis for treatment or other patient management decisions. A negative result may occur with  improper specimen collection/handling, submission of specimen other than nasopharyngeal swab, presence of viral mutation(s) within the areas targeted by this assay, and inadequate number of viral copies(<138 copies/mL). A negative result must be combined with clinical observations, patient history, and epidemiological information. The expected result is Negative.  Fact Sheet for Patients:  BloggerCourse.com  Fact Sheet for Healthcare Providers:  SeriousBroker.it  This test is no t yet approved or cleared by the [place] FDA and  has been authorized for detection and/or diagnosis of SARS-CoV-2 by FDA under an Emergency Use Authorization (EUA). This EUA will remain  in effect (meaning this test can be used) for the duration of the COVID-19 declaration under Section 564(b)(1) of the Act, 21 U.S.C.section 360bbb-3(b)(1), unless the authorization is terminated  or revoked sooner.       Influenza A by PCR NEGATIVE NEGATIVE   Influenza B by PCR NEGATIVE NEGATIVE    Comment: (NOTE) The Xpert Xpress SARS-CoV-2/FLU/RSV plus assay is intended as an aid in the diagnosis of influenza from Nasopharyngeal swab specimens and should not be used as a sole basis for treatment. Nasal washings and aspirates are unacceptable for Xpert Xpress  SARS-CoV-2/FLU/RSV testing.  Fact Sheet for Patients: BloggerCourse.com  Fact Sheet for Healthcare Providers: SeriousBroker.it  This test is not yet approved or cleared by the [place] FDA and has been authorized for detection and/or diagnosis of SARS-CoV-2 by FDA under an Emergency Use Authorization (EUA). This EUA will remain in effect (meaning this test can be used) for the duration of the COVID-19 declaration under Section 564(b)(1) of the Act, 21 U.S.C. section 360bbb-3(b)(1), unless the authorization is terminated or revoked.     Resp Syncytial Virus by PCR NEGATIVE NEGATIVE    Comment: (NOTE) Fact Sheet for Patients: BloggerCourse.com  Fact Sheet for Healthcare Providers: SeriousBroker.it  This test is not yet approved or cleared by the [place] FDA and has been authorized for detection and/or diagnosis of SARS-CoV-2 by FDA under an Emergency Use Authorization (EUA). This EUA will remain in effect (meaning this test can be used) for the duration of the COVID-19 declaration under Section 564(b)(1) of the Act, 21 U.S.C. section 360bbb-3(b)(1), unless the authorization is terminated or revoked.  Performed at [facility], [address] [place], [place] [address]   Valproic acid level     Status: None   Collection Time: 11/03/22 12:15 AM  Result Value Ref Range   Valproic Acid Lvl 54 50.0 - 100.0 ug/mL    Comment: Performed at [facility], [address] [place], [place] [address]    Current Facility-Administered Medications  Medication Dose Route Frequency Provider Last Rate Last Admin   amLODipine (NORVASC) tablet 10 mg  10 mg Oral Daily Ward, Kristen N, DO       divalproex (DEPAKOTE ER) 24 hr tablet 500 mg  500 mg Oral BID Ward, Kristen N, DO       haloperidol (HALDOL) tablet 5 mg  5 mg Oral QHS Ward, Kristen N, DO   5 mg at 11/03/22  0054   hydrOXYzine (ATARAX) tablet 50 mg  50 mg Oral TID PRN Ward, Layla Maw, DO       paliperidone (INVEGA) 24 hr tablet 6 mg  6 mg Oral Daily Ward, Kristen N, DO       traZODone (DESYREL) tablet 100 mg  100 mg Oral QHS Ward, Kristen N, DO   100 mg at 11/03/22 0054   Current Outpatient Medications  Medication Sig Dispense Refill   amLODipine (NORVASC) 10 MG tablet Take 1 tablet (10 mg total) by mouth daily. (Patient not taking: Reported on 05/22/2022) 30 tablet 1   divalproex (DEPAKOTE ER) 500 MG 24 hr tablet Take 1 tablet (500 mg total) by mouth 2 (two) times daily. 20 tablet 0   haloperidol (HALDOL) 5 MG tablet Take 1 tablet (5 mg total) by mouth at bedtime. 10 tablet 0   hydrOXYzine (ATARAX) 50 MG tablet Take 1 tablet (50 mg total) by mouth 3 (three) times daily as needed for anxiety. (Patient not taking: Reported on 05/22/2022) 30 tablet 1   paliperidone (INVEGA SUSTENNA) 234 MG/1.5ML injection Inject 234 mg into the muscle every 28 (twenty-eight) days. 1.8 mL 1   paliperidone (INVEGA) 3 MG 24 hr tablet Take 2 tablets (6 mg total) by mouth daily. 20 tablet 0   traZODone (DESYREL) 100 MG tablet Take 1 tablet (100 mg total) by mouth at bedtime. 10 tablet 0    Musculoskeletal: Strength & Muscle Tone: within normal limits Gait & Station: normal Patient leans: N/A  Psychiatric Specialty Exam:  Presentation  General Appearance:  Bizarre  Eye Contact: Good  Speech: Slow  Speech Volume: Normal  Handedness: Right   Mood and Affect  Mood: Euthymic  Affect: Congruent   Thought Process  Thought Processes: Irrevelant  Descriptions of Associations:Circumstantial  Orientation:Full (Time, Place and Person)  Thought Content:Obsessions  History of Schizophrenia/Schizoaffective disorder:Yes  Duration of Psychotic Symptoms:Greater than six months  Hallucinations:Hallucinations: None  Ideas of Reference:None  Suicidal Thoughts:Suicidal Thoughts: No  Homicidal  Thoughts:Homicidal Thoughts: No   Sensorium  Memory: Immediate Fair; Recent Fair; Remote Fair  Judgment: Poor  Insight: Poor   Executive Functions  Concentration: Poor  Attention Span: Poor  Recall: Poor  Fund of Knowledge: Poor  Language: Poor   Psychomotor Activity  Psychomotor Activity: Psychomotor Activity: Normal   Assets  Assets: Manufacturing systems engineer; Financial Resources/Insurance; Resilience; Social Support   Sleep  Sleep: Sleep: Good Number of Hours of Sleep: 8   Physical Exam: Physical Exam Vitals and nursing note reviewed.  Constitutional:      Appearance: Normal appearance. He is normal weight.  HENT:     Head: Normocephalic and atraumatic.     Right Ear: External ear normal.     Left Ear: External ear normal.     Nose: Nose normal.  Cardiovascular:     Rate and Rhythm: Tachycardia present.  Pulmonary:     Effort: Pulmonary effort is normal.  Musculoskeletal:        General: Normal range of motion.     Cervical back: Normal range of motion.  Neurological:     General: No focal deficit present.     Mental Status: He is alert and oriented to person, place, and time.  Psychiatric:        Attention and Perception: Attention and perception normal.        Mood and Affect: Mood normal. Affect is blunt and flat.        Speech: Speech normal.        Behavior: Behavior normal. Behavior is cooperative.        Thought Content: Thought content normal.        Cognition and Memory: Cognition and memory normal.        Judgment: Judgment normal.    ROS Blood pressure (!) 153/107, pulse (!) 110, temperature 98.4 F (36.9 C), temperature source Oral, resp. rate 20, weight 100 kg, SpO2 97 %. Body mass index is 28.31 kg/m.  Treatment Plan Summary: Plan   Patient does meet the criteria for psychiatric inpatient admission  Disposition: Recommend psychiatric Inpatient admission when medically cleared. Supportive therapy provided about ongoing  stressors.  Gillermo Murdoch, NP 11/03/2022 3:47 AM

## 2022-11-03 NOTE — ED Notes (Signed)
Patient is IVC pending inpatient admit 

## 2022-11-03 NOTE — BH Assessment (Signed)
Per [facility] (Antoinette C.), patient to be referred out of system.  Referral information for Psychiatric Hospitalization re-faxed to;   [facility] ([phone]- [phone]), no appropriate bed.  [name] [phone]),   [address] [phone]),  [place] [phone], [phone], [phone] or [phone]),   [place] [phone]--- [phone]--- [phone]--- [phone])  [address] [phone]),   [name] [phone] -or- [phone]),   [facility] [phone] phone-- [phone])  [facility] [phone]),  [place] [phone])  [facility] [phone]).  [facility] [phone]).

## 2022-11-03 NOTE — ED Notes (Signed)
Pt is pacing around dayroom, this EDT asked if pt needed something. Pt stated he would like to take a shower. This tech told pt that showers are offered in the morning. Pt understood and still continued to pace around dayroom

## 2022-11-03 NOTE — ED Notes (Signed)
ED provider in to see patient 

## 2022-11-03 NOTE — ED Notes (Signed)
Pt was given [place] tray, ginger ale, and a blanket

## 2022-11-04 ENCOUNTER — Inpatient Hospital Stay
Admission: AD | Admit: 2022-11-04 | Discharge: 2022-11-08 | DRG: 885 | Disposition: A | Discharge status: Home / Self Care | Payer: No Typology Code available for payment source | Source: Intra-hospital | Attending: Psychiatry | Admitting: Psychiatry

## 2022-11-04 ENCOUNTER — Encounter: Payer: Self-pay | Admitting: Psychiatry

## 2022-11-04 ENCOUNTER — Other Ambulatory Visit: Payer: Self-pay

## 2022-11-04 DIAGNOSIS — F32A Depression, unspecified: Secondary | ICD-10-CM | POA: Diagnosis present

## 2022-11-04 DIAGNOSIS — Z1152 Encounter for screening for COVID-19: Secondary | ICD-10-CM

## 2022-11-04 DIAGNOSIS — F17210 Nicotine dependence, cigarettes, uncomplicated: Secondary | ICD-10-CM | POA: Diagnosis present

## 2022-11-04 DIAGNOSIS — I10 Essential (primary) hypertension: Secondary | ICD-10-CM | POA: Diagnosis present

## 2022-11-04 DIAGNOSIS — F250 Schizoaffective disorder, bipolar type: Principal | ICD-10-CM | POA: Diagnosis present

## 2022-11-04 DIAGNOSIS — Z79899 Other long term (current) drug therapy: Secondary | ICD-10-CM | POA: Diagnosis not present

## 2022-11-04 LAB — URINE DRUG SCREEN, QUALITATIVE (ARMC ONLY)
Amphetamines, Ur Screen: NOT DETECTED
Barbiturates, Ur Screen: NOT DETECTED
Benzodiazepine, Ur Scrn: NOT DETECTED
Cannabinoid 50 Ng, Ur ~~LOC~~: NOT DETECTED
Cocaine Metabolite,Ur ~~LOC~~: NOT DETECTED
MDMA (Ecstasy)Ur Screen: NOT DETECTED
Methadone Scn, Ur: NOT DETECTED
Opiate, Ur Screen: NOT DETECTED
Phencyclidine (PCP) Ur S: NOT DETECTED
Tricyclic, Ur Screen: POSITIVE — AB

## 2022-11-04 MED ORDER — DIPHENHYDRAMINE HCL 50 MG/ML IJ SOLN: 50.0000 mg | Freq: Three times a day (TID) | INTRAMUSCULAR | Status: DC | PRN

## 2022-11-04 MED ORDER — TRAZODONE HCL 100 MG PO TABS
100.0000 mg | ORAL_TABLET | Freq: Every day | ORAL | Status: DC
  Administered 2022-11-04 – 2022-11-07 (×4): 100 mg via ORAL
  Filled 2022-11-04 (×3): qty 1

## 2022-11-04 MED ORDER — HALOPERIDOL 5 MG PO TABS
5.0000 mg | ORAL_TABLET | Freq: Every day | ORAL | Status: DC
  Administered 2022-11-04 – 2022-11-07 (×4): 5 mg via ORAL
  Filled 2022-11-04 (×5): qty 1

## 2022-11-04 MED ORDER — CALCIUM CARBONATE ANTACID 500 MG PO CHEW: 400.0000 mg | CHEWABLE_TABLET | ORAL | Status: DC | PRN

## 2022-11-04 MED ORDER — HALOPERIDOL LACTATE 5 MG/ML IJ SOLN: 5.0000 mg | Freq: Three times a day (TID) | INTRAMUSCULAR | Status: DC | PRN

## 2022-11-04 MED ORDER — AMLODIPINE BESYLATE 5 MG PO TABS
10.0000 mg | ORAL_TABLET | Freq: Every day | ORAL | Status: DC
  Administered 2022-11-05 – 2022-11-08 (×4): 10 mg via ORAL
  Filled 2022-11-04 (×4): qty 2

## 2022-11-04 MED ORDER — DIPHENHYDRAMINE HCL 25 MG PO CAPS
50.0000 mg | ORAL_CAPSULE | Freq: Three times a day (TID) | ORAL | Status: DC | PRN
  Administered 2022-11-04 – 2022-11-07 (×4): 50 mg via ORAL
  Filled 2022-11-04 (×4): qty 2

## 2022-11-04 MED ORDER — DIVALPROEX SODIUM ER 500 MG PO TB24
500.0000 mg | ORAL_TABLET | Freq: Two times a day (BID) | ORAL | Status: DC
  Administered 2022-11-04 – 2022-11-08 (×8): 500 mg via ORAL
  Filled 2022-11-04 (×8): qty 1

## 2022-11-04 MED ORDER — PALIPERIDONE ER 3 MG PO TB24
6.0000 mg | ORAL_TABLET | Freq: Every day | ORAL | Status: DC
  Administered 2022-11-05 – 2022-11-08 (×4): 6 mg via ORAL
  Filled 2022-11-04 (×4): qty 2

## 2022-11-04 MED ORDER — HYDROXYZINE HCL 50 MG PO TABS
50.0000 mg | ORAL_TABLET | Freq: Three times a day (TID) | ORAL | Status: DC | PRN
  Administered 2022-11-04 – 2022-11-05 (×2): 50 mg via ORAL
  Filled 2022-11-04 (×2): qty 1

## 2022-11-04 MED ORDER — HALOPERIDOL 5 MG PO TABS
5.0000 mg | ORAL_TABLET | Freq: Three times a day (TID) | ORAL | Status: DC | PRN
  Administered 2022-11-04 – 2022-11-06 (×3): 5 mg via ORAL
  Filled 2022-11-04 (×2): qty 1

## 2022-11-04 MED ORDER — LORAZEPAM 2 MG PO TABS
2.0000 mg | ORAL_TABLET | Freq: Three times a day (TID) | ORAL | Status: DC | PRN
  Administered 2022-11-04 – 2022-11-07 (×4): 2 mg via ORAL
  Filled 2022-11-04 (×4): qty 1

## 2022-11-04 MED ORDER — NICOTINE POLACRILEX 2 MG MT GUM
2.0000 mg | CHEWING_GUM | OROMUCOSAL | Status: DC | PRN
  Administered 2022-11-04 – 2022-11-08 (×2): 2 mg via ORAL
  Filled 2022-11-04 (×3): qty 1

## 2022-11-04 MED ORDER — ACETAMINOPHEN 325 MG PO TABS
650.0000 mg | ORAL_TABLET | Freq: Four times a day (QID) | ORAL | Status: DC | PRN
  Administered 2022-11-06 – 2022-11-07 (×2): 650 mg via ORAL
  Filled 2022-11-04 (×3): qty 2

## 2022-11-04 MED ORDER — LORAZEPAM 2 MG/ML IJ SOLN: 2.0000 mg | Freq: Three times a day (TID) | INTRAMUSCULAR | Status: DC | PRN

## 2022-11-04 MED ORDER — IBUPROFEN 600 MG PO TABS
600.0000 mg | ORAL_TABLET | Freq: Once | ORAL | Status: AC
  Administered 2022-11-04: 600 mg via ORAL

## 2022-11-04 NOTE — ED Notes (Signed)
Patient observed going into another patient's room.  This RN, Medical laboratory scientific officer EDT on to unit.  Patient told to come out of patient room and that he was not allowed to be in another patient's room.  Patient responded "ok", then looked at this RN and states "you are God".  Patient requesting to leave hospital, instructed patient that is was still night time and he would have to speak with provider later in the morning.

## 2022-11-04 NOTE — Progress Notes (Signed)
   11/04/22 2200  Psych Admission Type (Psych Patients Only)  Admission Status Involuntary  Psychosocial Assessment  Patient Complaints None  Eye Contact Brief;Watchful  Facial Expression Wide-eyed  Affect Preoccupied  Speech Soft  Interaction Assertive  Motor Activity Slow  Appearance/Hygiene Body odor;Disheveled  Behavior Characteristics Appropriate to situation  Mood Suspicious  Thought Process  Coherency Circumstantial;Blocking  Content WDL  Delusions None reported or observed  Perception WDL  Hallucination None reported or observed  Judgment Limited  Confusion None  Danger to Self  Current suicidal ideation? Denies (Denies)  Agreement Not to Harm Self Yes  Description of Agreement verbal  Danger to Others  Danger to Others None reported or observed   Patient compliant with medications. Denies SI/HI/A/VH at present and verbally contracts for safety. Support and encouragement provided.

## 2022-11-04 NOTE — ED Notes (Signed)
Patient moved to BHU 8, this RN concerned with patients increased pacing, steps becoming more forceful and he was walking and stopping in front of other patient rooms, patient saying he needs to leave the hospital.

## 2022-11-04 NOTE — ED Notes (Signed)
Patient continues to be pacing around room.

## 2022-11-04 NOTE — ED Notes (Signed)
Breakfast provided and vs obtained. No distress noted at this time.

## 2022-11-04 NOTE — ED Notes (Signed)
Patient observed pacing around room. 

## 2022-11-04 NOTE — ED Notes (Signed)
Lunch tray provided with water.

## 2022-11-04 NOTE — ED Notes (Signed)
Patient pacing in day room.  

## 2022-11-04 NOTE — Progress Notes (Signed)
Admission Note:   Report was received from Sue Lush, [place] on a 27 year-old male who presents IVC in no acute distress for the treatment of SI and medication noncompliance. Patient appears flat, but was calm and mostly cooperative with admission process. Patient stated that he is here because "I needed to go, leave to a facility that allows me to smoke". Patient denied any signs/symptoms of depression and anxiety. Patient also denied SI/HI/AVH and pain at this time. Patient's goal for treatment is to "work on everything". Patient has a past medical history of Asthma, Schizophrenia, and HTN. Skin was assessed with Wylene Men, RN and found to be clear of any abnormal marks apart from a bruise to the right arm. Patient searched and no contraband found and unit policies explained and understanding verbalized. Consents obtained. Food and fluids offered, and both accepted. Patient had no additional questions or concerns to voice to this Clinical research associate. Patient remains safe on the unit at this time.

## 2022-11-04 NOTE — ED Notes (Signed)
Patient observed pacing around room.

## 2022-11-04 NOTE — Plan of Care (Signed)
New admission.  Problem: Education: Goal: Knowledge of General Education information will improve Description: Including pain rating scale, medication(s)/side effects and non-pharmacologic comfort measures Outcome: Not Progressing   Problem: Health Behavior/Discharge Planning: Goal: Ability to manage health-related needs will improve Outcome: Not Progressing   Problem: Clinical Measurements: Goal: Ability to maintain clinical measurements within normal limits will improve Outcome: Not Progressing Goal: Will remain free from infection Outcome: Not Progressing Goal: Diagnostic test results will improve Outcome: Not Progressing Goal: Respiratory complications will improve Outcome: Not Progressing Goal: Cardiovascular complication will be avoided Outcome: Not Progressing   Problem: Activity: Goal: Risk for activity intolerance will decrease Outcome: Not Progressing   Problem: Nutrition: Goal: Adequate nutrition will be maintained Outcome: Not Progressing   Problem: Coping: Goal: Level of anxiety will decrease Outcome: Not Progressing   Problem: Elimination: Goal: Will not experience complications related to bowel motility Outcome: Not Progressing Goal: Will not experience complications related to urinary retention Outcome: Not Progressing   Problem: Pain Managment: Goal: General experience of comfort will improve Outcome: Not Progressing   Problem: Safety: Goal: Ability to remain free from injury will improve Outcome: Not Progressing   Problem: Skin Integrity: Goal: Risk for impaired skin integrity will decrease Outcome: Not Progressing   Problem: Education: Goal: Knowledge of [place] [facility] Education information/materials will improve Outcome: Not Progressing Goal: Emotional status will improve Outcome: Not Progressing Goal: Mental status will improve Outcome: Not Progressing Goal: Verbalization of understanding the information provided will  improve Outcome: Not Progressing   Problem: Safety: Goal: Periods of time without injury will increase Outcome: Not Progressing   Problem: Self-Concept: Goal: Ability to disclose and discuss suicidal ideas will improve Outcome: Not Progressing Goal: Will verbalize positive feelings about self Outcome: Not Progressing   Problem: Education: Goal: Will be free of psychotic symptoms Outcome: Not Progressing Goal: Knowledge of the prescribed therapeutic regimen will improve Outcome: Not Progressing   Problem: Coping: Goal: Coping ability will improve Outcome: Not Progressing Goal: Will verbalize feelings Outcome: Not Progressing

## 2022-11-04 NOTE — Tx Team (Signed)
Initial Treatment Plan 11/04/2022 7:00 PM [name] [name] KPT:[id number]    PATIENT STRESSORS: Medication change or noncompliance     PATIENT STRENGTHS: Motivation for treatment/growth  Supportive family/friends    PATIENT IDENTIFIED PROBLEMS: Suicide Risk, prior to admission  Medication noncompliance                   DISCHARGE CRITERIA:  Ability to meet basic life and health needs Improved stabilization in mood, thinking, and/or behavior Need for constant or close observation no longer present Reduction of life-threatening or endangering symptoms to within safe limits  PRELIMINARY DISCHARGE PLAN: Outpatient therapy Return to previous living arrangement  PATIENT/FAMILY INVOLVEMENT: This treatment plan has been presented to and reviewed with the patient, [name] [name]. The patient has been given the opportunity to ask questions and make suggestions.  Merritt Kibby, RN 11/04/2022, 7:00 PM

## 2022-11-04 NOTE — BH Assessment (Signed)
Referral check:   [facility] ([phone]), no appropriate bed.   [name] [phone]), No answer/ability to leave a voicemail.    [name] [phone]), Per Seward Grater, pt declined due to having no beds.   [facility] [phone], [phone], [phone] or [phone]), Voicemail left requesting a call back.   [facility] [phone]--- [phone]--- [phone]--- [phone]) Voicemail left.   [address] [phone]), Busy signal  [name] [phone] or [phone]) Unable to reach anyone; busy signal x 4.   [facility] [phone] phone-- [phone]) No answer/ability to leave a voicemail.    [facility] [phone]), Left a voicemail requesting a call back   [facility] [phone]) Per Willaim Sheng no beds available until discharges on Tuesday.   [facility] [phone]). Number no longer in service.   [facility] [phone]) Intake staff requested a refax. Task completed at 1:05 AM.

## 2022-11-04 NOTE — Group Note (Signed)
Date:  11/04/2022 Time:  6:29 PM  Group Topic/Focus:  Activity Group    Participation Level:  Minimal  Participation Quality:  Appropriate  Affect:  Appropriate  Cognitive:  Appropriate  Insight: Appropriate  Engagement in Group:  Limited  Modes of Intervention:  Activity  Additional Comments:    Wilford Corner 11/04/2022, 6:29 PM

## 2022-11-04 NOTE — ED Notes (Signed)
Patient continues pacing around dayroom.

## 2022-11-04 NOTE — Group Note (Signed)
Date:  11/04/2022 Time:  10:08 PM  Group Topic/Focus:  Wrap-Up Group:   The focus of this group is to help patients review their daily goal of treatment and discuss progress on daily workbooks.    Participation Level:  None  Participation Quality:  Resistant  Affect:  Flat  Cognitive:  Disorganized  Insight: Lacking  Engagement in Group:  Lacking and Limited  Modes of Intervention:  Limit-setting  Additional Comments:     Maglione,Hetty Linhart E 11/04/2022, 10:08 PM

## 2022-11-04 NOTE — ED Notes (Signed)
Patient reporting a toothache, MD notified, awaiting orders.

## 2022-11-04 NOTE — ED Provider Notes (Signed)
Emergency Medicine Observation Re-evaluation Note  [name] [name] is a 27 y.o. male, seen on rounds today.  Pt initially presented to the ED for complaints of IVC   Physical Exam  BP (!) 143/100 (BP Location: Right Arm)   Pulse (!) 109   Temp 98.2 F (36.8 C) (Oral)   Resp 17   Wt 100 kg   SpO2 95%   BMI 28.31 kg/m  Physical Exam General: NAD  ED Course / MDM  EKG:   I have reviewed the labs performed to date as well as medications administered while in observation.    Plan  Current plan is for Psych/toc.    Willy Eddy, MD 11/04/22 [phone]

## 2022-11-04 NOTE — ED Notes (Signed)
Patient pacing around day room.

## 2022-11-05 DIAGNOSIS — F250 Schizoaffective disorder, bipolar type: Secondary | ICD-10-CM | POA: Diagnosis not present

## 2022-11-05 MED ORDER — NICOTINE 21 MG/24HR TD PT24
21.0000 mg | MEDICATED_PATCH | Freq: Every day | TRANSDERMAL | Status: DC
  Administered 2022-11-05 – 2022-11-07 (×3): 21 mg via TRANSDERMAL
  Filled 2022-11-05 (×4): qty 1

## 2022-11-05 NOTE — Group Note (Signed)
Date:  11/05/2022 Time:  6:30 PM  Group Topic/Focus:  Activity Group    Participation Level:  Did Not Attend   Lynelle [place] [name] 11/05/2022, 6:30 PM

## 2022-11-05 NOTE — [facility] Suicide Risk Assessment (Signed)
[facility] Admission Suicide Risk Assessment   Nursing information obtained from:  Patient Demographic factors:  Male, Adolescent or young adult, Living alone, Low socioeconomic status Current Mental Status:  NA Loss Factors:  NA Historical Factors:  NA Risk Reduction Factors:  NA  Total Time spent with patient: 45 minutes Principal Problem: Schizoaffective disorder, bipolar type Diagnosis:  Principal Problem:   Schizoaffective disorder, bipolar type  Subjective Data: Patient seen and chart reviewed.  Patient known from previous encounters.  27 year old man with a history of schizophrenia or schizoaffective disorder brought in by police after being found behaving erratically in public and then asking that someone should kill him.  Patient tells me that he was having "an episode" in which he believed that if he killed himself he would go to heaven.  Admits that he was thinking about killing himself by buying a bottle of helium.  Currently denies any suicidal thought.  Denies hallucinations.  Denies feeling depressed.  Continued Clinical Symptoms:  Alcohol Use Disorder Identification Test Final Score (AUDIT): 0 The "Alcohol Use Disorders Identification Test", Guidelines for Use in Primary Care, Second Edition.  World Science writer [facility]). Score between 0-7:  no or low risk or alcohol related problems. Score between 8-15:  moderate risk of alcohol related problems. Score between 16-19:  high risk of alcohol related problems. Score 20 or above:  warrants further diagnostic evaluation for alcohol dependence and treatment.   CLINICAL FACTORS:   Schizophrenia:   Command hallucinatons   Musculoskeletal: Strength & Muscle Tone: within normal limits Gait & Station: normal Patient leans: N/A  Psychiatric Specialty Exam:  Presentation  General Appearance:  Bizarre  Eye Contact: Good  Speech: Slow  Speech Volume: Normal  Handedness: Right   Mood and Affect   Mood: Euthymic  Affect: Congruent   Thought Process  Thought Processes: Irrevelant  Descriptions of Associations:Circumstantial  Orientation:Full (Time, Place and Person)  Thought Content:Obsessions  History of Schizophrenia/Schizoaffective disorder:Yes  Duration of Psychotic Symptoms:Greater than six months  Hallucinations:No data recorded Ideas of Reference:None  Suicidal Thoughts:No data recorded Homicidal Thoughts:No data recorded  Sensorium  Memory: Immediate Fair; Recent Fair; Remote Fair  Judgment: Poor  Insight: Poor   Executive Functions  Concentration: Poor  Attention Span: Poor  Recall: Poor  Fund of Knowledge: Poor  Language: Poor   Psychomotor Activity  Psychomotor Activity:No data recorded  Assets  Assets: Communication Skills; Financial Resources/Insurance; Resilience; Social Support   Sleep  Sleep:No data recorded   Physical Exam: Physical Exam Vitals and nursing note reviewed.  Constitutional:      Appearance: Normal appearance.  HENT:     Head: Normocephalic and atraumatic.     Mouth/Throat:     Pharynx: Oropharynx is clear.  Eyes:     Pupils: Pupils are equal, round, and reactive to light.  Cardiovascular:     Rate and Rhythm: Normal rate and regular rhythm.  Pulmonary:     Effort: Pulmonary effort is normal.     Breath sounds: Normal breath sounds.  Abdominal:     General: Abdomen is flat.     Palpations: Abdomen is soft.  Musculoskeletal:        General: Normal range of motion.  Skin:    General: Skin is warm and dry.  Neurological:     General: No focal deficit present.     Mental Status: He is alert. Mental status is at baseline.  Psychiatric:        Attention and Perception: Attention normal.  Mood and Affect: Mood normal. Affect is blunt.        Speech: Speech is delayed.        Behavior: Behavior is cooperative.        Thought Content: Thought content normal.        Cognition and  Memory: Memory is impaired.    Review of Systems  Constitutional: Negative.   HENT: Negative.    Eyes: Negative.   Respiratory: Negative.    Cardiovascular: Negative.   Gastrointestinal: Negative.   Musculoskeletal: Negative.   Skin: Negative.   Neurological: Negative.   Psychiatric/Behavioral:  Positive for memory loss. Negative for depression, hallucinations, substance abuse and suicidal ideas.    Blood pressure (!) 141/91, pulse (!) 117, temperature 98.4 F (36.9 C), temperature source Oral, resp. rate 18, height 6' (1.829 m), weight 109.8 kg, SpO2 100 %. Body mass index is 32.82 kg/m.   COGNITIVE FEATURES THAT CONTRIBUTE TO RISK:  Thought constriction (tunnel vision)    SUICIDE RISK:   Mild:  Suicidal ideation of limited frequency, intensity, duration, and specificity.  There are no identifiable plans, no associated intent, mild dysphoria and related symptoms, good self-control (both objective and subjective assessment), few other risk factors, and identifiable protective factors, including available and accessible social support.  PLAN OF CARE: He tells me at that time he was hearing voices telling him to kill himself because he would go to heaven.  He says he is no longer hearing those voices and does not have any wish to kill himself.  Continue 15-minute checks.  Continue medication.  Making contact with ACT team.  Ongoing assessment of dangerousness prior to discharge  I certify that inpatient services furnished can reasonably be expected to improve the patient's condition.   Mordecai Rasmussen, MD 11/05/2022, 3:34 PM

## 2022-11-05 NOTE — Group Note (Signed)
Date:  11/05/2022 Time:  9:53 PM  Group Topic/Focus:  Wrap-Up Group:   The focus of this group is to help patients review their daily goal of treatment and discuss progress on daily workbooks.    Participation Level:  Minimal  Participation Quality:  Appropriate and Resistant  Affect:  Appropriate and Flat  Cognitive:  Disorganized  Insight: Lacking  Engagement in Group:  Limited  Modes of Intervention:  Activity  Additional Comments:     Maglione,Makenley Shimp E 11/05/2022, 9:53 PM

## 2022-11-05 NOTE — Progress Notes (Signed)
Patient pacing in and out of his room endorsing anxiety and agitation. Prn medications given see Mar and effective Patient able to calm and sleep.   Q 15 minutes safety checks ongoing Pt remains safe.

## 2022-11-05 NOTE — Group Note (Signed)
Recreation Therapy Group Note   Group Topic:Healthy Support Systems  Group Date: 11/05/2022 Start Time: 1010 End Time: 1100 Facilitators: Rosina Lowenstein, LRT, CTRS Location:  Craft Room  Group Description: Straw Bridge. Patients were given 10 plastic drinking straws and an equal length of masking tape. Using the materials provided, patients were instructed to build a free-standing bridge-like structure to suspend an everyday item (ex: deck of cards) off the floor or table surface. All materials were required to be used in Secondary school teacher. LRT facilitated post-activity discussion reviewing the importance of having strong and healthy support systems in our lives. LRT discussed how the people in our lives serve as the tape and the book we placed on top of our straw structure are the stressors we face in daily life. LRT and pts discussed what happens in our life when things get too heavy for [place], and we don't have strong supports outside of the hospital. Pt shared 2 of their healthy supports aloud in the group.   Goal Area(s) Addressed:  Patient will identify 2 healthy supports in their life. Patient will identify skills to successfully complete activity. Patient will identify correlation of this activity to life post-discharge.    Affect/Mood: N/A   Participation Level: Did not attend    Clinical Observations/Individualized Feedback: [name] [name] did not attend group due to resting in his room.  Plan: Continue to engage patient in RT group sessions 2-3x/week.   Rosina Lowenstein, LRT, CTRS 11/05/2022 11:32 AM

## 2022-11-05 NOTE — Group Note (Signed)
[facility] LCSW Group Therapy Note   Group Date: 11/05/2022 Start Time: 1315 End Time: 1420   Type of Therapy/Topic:  Group Therapy:  Emotion Regulation  Participation Level:  Did Not Attend    Description of Group:    The purpose of this group is to assist patients in learning to regulate negative emotions and experience positive emotions. Patients will be guided to discuss ways in which they have been vulnerable to their negative emotions. These vulnerabilities will be juxtaposed with experiences of positive emotions or situations, and patients challenged to use positive emotions to combat negative ones. Special emphasis will be placed on coping with negative emotions in conflict situations, and patients will process healthy conflict resolution skills.  Therapeutic Goals: Patient will identify two positive emotions or experiences to reflect on in order to balance out negative emotions:  Patient will label two or more emotions that they find the most difficult to experience:  Patient will be able to demonstrate positive conflict resolution skills through discussion or role plays:   Summary of Patient Progress: X   Therapeutic Modalities:   Cognitive Behavioral Therapy Feelings Identification Dialectical Behavioral Therapy   Maribelle Hopple R Ashaya Raftery, LCSW 

## 2022-11-05 NOTE — Progress Notes (Signed)
D- Patient alert and oriented x 3-4. Affect flat/mood preoccupied. Denies SI/ HI /AVH and anxiety nor depression. He denies pain. He states he "feels better now back taking my medications". A- Scheduled medications administered to patient, per MD orders. Support and encouragement provided.  Routine safety checks conducted every 15 minutes without incident. Patient informed to notify staff with problems or concerns and verbalizes understanding. R- No adverse drug reactions noted. Patient compliant with medications and treatment plan. Patient receptive, calm, and cooperative. He is isolative to his room except for meals. He contracts for safety and remains safe on the unit at this time.

## 2022-11-05 NOTE — H&P (Signed)
Psychiatric Admission Assessment Adult  Patient Identification: [name] [name] MRN:  [id number] Date of Evaluation:  11/05/2022 Chief Complaint:  Schizoaffective disorder, bipolar type [F25.0] Principal Diagnosis: Schizoaffective disorder, bipolar type Diagnosis:  Principal Problem:   Schizoaffective disorder, bipolar type  History of Present Illness: Patient seen and chart reviewed.  Patient known from previous encounters.  27 year old man with schizophrenia or schizoaffective disorder.  Brought to the emergency room by police after being found behaving erratically in public.  Running out into the middle of the street, saying that he wanted somebody to kill him.  On interview today the patient is calm and forthcoming.  He tells me that he had "an episode" that just lasted a few hours but that during that time he was hearing voices telling him that he should kill himself because then he would go to heaven.  He said he was trying to go to the store to buy some helium to kill himself with.  Patient says he has been fully compliant with his medicines.  Denied that he had been drinking or using any other drugs.  Collateral history from his ACT team is that he has actually been pretty compliant and from his family that he has been taking his medicine.  Patient is currently denying any wish to die and denies depression denies hallucinations.  Denies physical symptoms. Associated Signs/Symptoms: Depression Symptoms:  suicidal thoughts with specific plan, (Hypo) Manic Symptoms:  Impulsivity, Anxiety Symptoms:   None reported Psychotic Symptoms:  Delusions, Hallucinations: Auditory PTSD Symptoms: Negative Total Time spent with patient: 45 minutes  Past Psychiatric History: Patient has a past history of schizophrenia or schizoaffective disorder with multiple prior hospitalizations usually related to noncompliance sometimes cannabis use sometimes both.  [facility]ed by [name].  Does not have a  past history of actual suicide attempts.  Is the patient at risk to self? Yes.    Has the patient been a risk to self in the past 6 months? Yes.    Has the patient been a risk to self within the distant past? Yes.    Is the patient a risk to others? No.  Has the patient been a risk to others in the past 6 months? No.  Has the patient been a risk to others within the distant past? No.   [place] Scale:  Flowsheet Row Admission (Current) from 11/04/2022 in [facility] INPATIENT [facility]AL [facility]ICINE ED from 11/03/2022 in [facility] Emergency Department at [facility] ED from 05/21/2022 in [facility] Emergency Department at [facility]  C-SSRS RISK CATEGORY No Risk No Risk No Risk        Prior Inpatient Therapy: Yes.   If yes, describe multiple prior admissions Prior Outpatient Therapy: Yes.   If yes, describe [name]  Alcohol Screening: 1. How often do you have a drink containing alcohol?: Never 2. How many drinks containing alcohol do you have on a typical day when you are drinking?: 1 or 2 3. How often do you have six or more drinks on one occasion?: Never AUDIT-C Score: 0 4. How often during the last year have you found that you were not able to stop drinking once you had started?: Never 5. How often during the last year have you failed to do what was normally expected from you because of drinking?: Never 6. How often during the last year have you needed a first drink in the morning to get yourself going after a heavy drinking session?: Never 7.  How often during the last year have you had a feeling of guilt of remorse after drinking?: Never 8. How often during the last year have you been unable to remember what happened the night before because you had been drinking?: Never 9. Have you or someone else been injured as a result of your drinking?: No 10. Has a relative or friend or a doctor or another health worker been concerned about your drinking or suggested you cut  down?: No Alcohol Use Disorder Identification Test Final Score (AUDIT): 0 Substance Abuse History in the last 12 months:  Yes.   Consequences of Substance Abuse: Intermittent cannabis worsens the symptoms and behavior Previous Psychotropic Medications: Yes  Psychological Evaluations: Yes  Past Medical History:  Past Medical History:  Diagnosis Date   Asthma    Depression    Psychosis    Schizoaffective disorder     Past Surgical History:  Procedure Laterality Date   BACK SURGERY     I&D groin  2017   Family History: History reviewed. No pertinent family history. Family Psychiatric  History: None Tobacco Screening:  Social History   Tobacco Use  Smoking Status Every Day   Packs/day: .25   Types: Cigarettes  Smokeless Tobacco Never    BH Tobacco Counseling     Are you interested in Tobacco Cessation Medications?  Yes, implement Nicotene Replacement Protocol Counseled patient on smoking cessation:  Refused/Declined practical counseling Reason Tobacco Screening Not Completed: Patient Refused Screening       Social History:  Social History   Substance and Sexual Activity  Alcohol Use No     Social History   Substance and Sexual Activity  Drug Use Yes   Types: Marijuana   Comment: reports cannabis use as funds permit    Additional Social History:                           Allergies:  No Known Allergies Lab Results: No results found for this or any previous visit (from the past 48 hour(s)).  Blood Alcohol level:  Lab Results  Component Value Date   ETH <10 11/03/2022   ETH <10 05/21/2022    Metabolic Disorder Labs:  Lab Results  Component Value Date   HGBA1C 5.1 12/03/2021   MPG 99.67 12/03/2021   MPG 91.06 07/19/2020   No results found for: "PROLACTIN" Lab Results  Component Value Date   CHOL 197 12/06/2021   TRIG 111 12/06/2021   HDL 46 12/06/2021   CHOLHDL 4.3 12/06/2021   VLDL 22 12/06/2021   LDLCALC 129 (H) 12/06/2021    LDLCALC 177 (H) 07/19/2020    Current Medications: Current Facility-Administered Medications  Medication Dose Route Frequency Provider Last Rate Last Admin   acetaminophen (TYLENOL) tablet 650 mg  650 mg Oral Q6H PRN Bennett, Christal H, NP       amLODipine (NORVASC) tablet 10 mg  10 mg Oral Daily Bennett, Christal H, NP   10 mg at 11/05/22 1234   calcium carbonate (TUMS - dosed in mg elemental calcium) chewable tablet 400 mg of elemental calcium  400 mg of elemental calcium Oral Q4H PRN Bennett, Christal H, NP       diphenhydrAMINE (BENADRYL) capsule 50 mg  50 mg Oral TID PRN Bennett, Christal H, NP   50 mg at 11/04/22 2257   Or   diphenhydrAMINE (BENADRYL) injection 50 mg  50 mg Intramuscular TID PRN Hampton Abbot, NP  divalproex (DEPAKOTE ER) 24 hr tablet 500 mg  500 mg Oral BID Bennett, Christal H, NP   500 mg at 11/05/22 1234   haloperidol (HALDOL) tablet 5 mg  5 mg Oral QHS Bennett, Christal H, NP   5 mg at 11/04/22 2122   haloperidol (HALDOL) tablet 5 mg  5 mg Oral TID PRN Thurston Hole H, NP   5 mg at 11/04/22 2256   Or   haloperidol lactate (HALDOL) injection 5 mg  5 mg Intramuscular TID PRN Bennett, Christal H, NP       hydrOXYzine (ATARAX) tablet 50 mg  50 mg Oral TID PRN Willeen Cass, Christal H, NP   50 mg at 11/04/22 2256   LORazepam (ATIVAN) tablet 2 mg  2 mg Oral TID PRN Thurston Hole H, NP   2 mg at 11/04/22 2256   Or   LORazepam (ATIVAN) injection 2 mg  2 mg Intramuscular TID PRN Bennett, Christal H, NP       nicotine (NICODERM CQ - dosed in mg/24 hours) patch 21 mg  21 mg Transdermal Daily Alisabeth Selkirk T, MD       nicotine polacrilex (NICORETTE) gum 2 mg  2 mg Oral PRN Keliah Harned T, MD   2 mg at 11/04/22 1709   paliperidone (INVEGA) 24 hr tablet 6 mg  6 mg Oral Daily Bennett, Christal H, NP   6 mg at 11/05/22 1233   traZODone (DESYREL) tablet 100 mg  100 mg Oral QHS Bennett, Christal H, NP   100 mg at 11/04/22 2122   PTA Medications: Medications Prior  to Admission  Medication Sig Dispense Refill Last Dose   amLODipine (NORVASC) 10 MG tablet Take 1 tablet (10 mg total) by mouth daily. (Patient not taking: Reported on 05/22/2022) 30 tablet 1    chlorproMAZINE (THORAZINE) 25 MG tablet Take 25 mg by mouth 3 (three) times daily.      cloZAPine (CLOZARIL) 100 MG tablet Take 300 mg by mouth at bedtime.      divalproex (DEPAKOTE ER) 500 MG 24 hr tablet Take 1 tablet (500 mg total) by mouth 2 (two) times daily. 20 tablet 0    glycopyrrolate (ROBINUL) 2 MG tablet Take 2 mg by mouth at bedtime.      haloperidol (HALDOL) 5 MG tablet Take 1 tablet (5 mg total) by mouth at bedtime. (Patient not taking: Reported on 11/04/2022) 10 tablet 0    hydrOXYzine (ATARAX) 50 MG tablet Take 1 tablet (50 mg total) by mouth 3 (three) times daily as needed for anxiety. (Patient not taking: Reported on 05/22/2022) 30 tablet 1    loxapine (LOXITANE) 10 MG capsule Take 10 mg by mouth 2 (two) times daily. (Patient not taking: Reported on 11/04/2022)      paliperidone (INVEGA SUSTENNA) 234 MG/1.5ML injection Inject 234 mg into the muscle every 28 (twenty-eight) days. 1.8 mL 1    paliperidone (INVEGA) 3 MG 24 hr tablet Take 2 tablets (6 mg total) by mouth daily. (Patient not taking: Reported on 11/04/2022) 20 tablet 0    traZODone (DESYREL) 100 MG tablet Take 1 tablet (100 mg total) by mouth at bedtime. 10 tablet 0     Musculoskeletal: Strength & Muscle Tone: within normal limits Gait & Station: normal Patient leans: N/A            Psychiatric Specialty Exam:  Presentation  General Appearance:  Bizarre  Eye Contact: Good  Speech: Slow  Speech Volume: Normal  Handedness: Right   Mood and  Affect  Mood: Euthymic  Affect: Congruent   Thought Process  Thought Processes: Irrevelant  Duration of Psychotic Symptoms: Chronic intermittent some improvement on medicine Past Diagnosis of Schizophrenia or Psychoactive disorder: Yes  Descriptions of  Associations:Circumstantial  Orientation:Full (Time, Place and Person)  Thought Content:Obsessions  Hallucinations:No data recorded Ideas of Reference:None  Suicidal Thoughts:No data recorded Homicidal Thoughts:No data recorded  Sensorium  Memory: Immediate Fair; Recent Fair; Remote Fair  Judgment: Poor  Insight: Poor   Executive Functions  Concentration: Poor  Attention Span: Poor  Recall: Poor  Fund of Knowledge: Poor  Language: Poor   Psychomotor Activity  Psychomotor Activity:No data recorded  Assets  Assets: Communication Skills; Financial Resources/Insurance; Resilience; Social Support   Sleep  Sleep:No data recorded   Physical Exam: Physical Exam Vitals and nursing note reviewed.  Constitutional:      Appearance: Normal appearance.  HENT:     Head: Normocephalic and atraumatic.     Mouth/Throat:     Pharynx: Oropharynx is clear.  Eyes:     Pupils: Pupils are equal, round, and reactive to light.  Cardiovascular:     Rate and Rhythm: Normal rate and regular rhythm.  Pulmonary:     Effort: Pulmonary effort is normal.     Breath sounds: Normal breath sounds.  Abdominal:     General: Abdomen is flat.     Palpations: Abdomen is soft.  Musculoskeletal:        General: Normal range of motion.  Skin:    General: Skin is warm and dry.  Neurological:     General: No focal deficit present.     Mental Status: He is alert. Mental status is at baseline.  Psychiatric:        Attention and Perception: He is inattentive.        Mood and Affect: Mood normal. Affect is blunt.        Speech: Speech is delayed.        Behavior: Behavior is slowed.        Thought Content: Thought content normal.        Cognition and Memory: Memory is impaired.    Review of Systems  Constitutional: Negative.   HENT: Negative.    Eyes: Negative.   Respiratory: Negative.    Cardiovascular: Negative.   Gastrointestinal: Negative.   Musculoskeletal: Negative.    Skin: Negative.   Neurological: Negative.   Psychiatric/Behavioral: Negative.     Blood pressure (!) 141/91, pulse (!) 117, temperature 98.4 F (36.9 C), temperature source Oral, resp. rate 18, height 6' (1.829 m), weight 109.8 kg, SpO2 100 %. Body mass index is 32.82 kg/m.  Treatment Plan Summary: Daily contact with patient to assess and evaluate symptoms and progress in treatment, Medication management, and Plan reviewed medication and make sure he is back on what he was on previously.  We are still waiting to find out when his last long-acting injection was.  Continue 15-minute checks.  Ongoing assessment of dangerousness prior to discharge.  Informing ACT team of collateral information.  Observation Level/Precautions:  15 minute checks  Laboratory:  Chemistry Profile  Psychotherapy:    Medications:    Consultations:    Discharge Concerns:    Estimated LOS:  Other:     Physician Treatment Plan for Primary Diagnosis: Schizoaffective disorder, bipolar type Long Term Goal(s): Improvement in symptoms so as ready for discharge  Short Term Goals: Ability to verbalize feelings will improve, Ability to disclose and discuss suicidal ideas, and Ability to  demonstrate self-control will improve  Physician Treatment Plan for Secondary Diagnosis: Principal Problem:   Schizoaffective disorder, bipolar type  Long Term Goal(s): Improvement in symptoms so as ready for discharge  Short Term Goals: Compliance with prescribed medications will improve  I certify that inpatient services furnished can reasonably be expected to improve the patient's condition.    Mordecai Rasmussen, MD 4/16/20243:36 PM

## 2022-11-05 NOTE — Group Note (Signed)
Date:  11/05/2022 Time:  6:50 PM  Group Topic/Focus:  Goals Group:   The focus of this group is to help patients establish daily goals to achieve during treatment and discuss how the patient can incorporate goal setting into their daily lives to aide in recovery.  Community Group    Participation Level:  Did Not Attend   Doug Sou 11/05/2022, 6:50 PM

## 2022-11-06 DIAGNOSIS — F250 Schizoaffective disorder, bipolar type: Secondary | ICD-10-CM | POA: Diagnosis not present

## 2022-11-06 LAB — LIPID PANEL
Cholesterol: 181 mg/dL (ref 0–200)
HDL: 49 mg/dL (ref >40)
LDL Cholesterol: 109 mg/dL — ABNORMAL HIGH (ref 0–99)
Total CHOL/HDL Ratio: 3.7 RATIO
Triglycerides: 117 mg/dL (ref <150)
VLDL: 23 mg/dL (ref 0–40)

## 2022-11-06 LAB — HEMOGLOBIN A1C
Hgb A1c MFr Bld: 4.8 % (ref 4.8–5.6)
Mean Plasma Glucose: 91.06 mg/dL

## 2022-11-06 MED ORDER — PALIPERIDONE PALMITATE ER 234 MG/1.5ML IM SUSY
234.0000 mg | PREFILLED_SYRINGE | INTRAMUSCULAR | Status: DC
  Administered 2022-11-06: 234 mg via INTRAMUSCULAR
  Filled 2022-11-06: qty 1.5

## 2022-11-06 MED ORDER — LOPERAMIDE HCL 2 MG PO CAPS: 2.0000 mg | ORAL_CAPSULE | ORAL | Status: DC | PRN

## 2022-11-06 NOTE — Progress Notes (Signed)
Patient finally took his scheduled morning medication and asked if he could take his Invega injection at dinner time. This Clinical research associate moved the administration time to 1700.

## 2022-11-06 NOTE — Progress Notes (Signed)
This writter was informed that patient had a dizzy spell in the dayroom, during breakfast. His vitals were taken and are as follows. Patient stated that he wanted to rest and take his medication later, possibly around lunch time. MD was notified during progression rounds.    11/06/22 0826  Vital Signs  Pulse Rate 95  Pulse Rate Source Monitor  BP (!) 133/95  BP Location Left Arm  BP Method Automatic  Patient Position (if appropriate) Sitting  Oxygen Therapy  SpO2 97 %  O2 Device Room [facility]

## 2022-11-06 NOTE — [facility] Counselor (Signed)
Adult Comprehensive Assessment  Patient ID: [name] [name], male   DOB: [birth year], 27 y.o.   MRN: [id number]  Information Source: Information source: Patient (Previous assessment from 04/26/22 encounter.)  Current Stressors:  Patient states their primary concerns and needs for treatment are:: "I had an episode." Patient states their goals for this hospitilization and ongoing recovery are:: "Getting better and leaving as soon as possible." Educational / Learning stressors: None reported Employment / Job issues: Pt is unemployed. Family Relationships: None reported Financial / Lack of resources (include bankruptcy): None reported Housing / Lack of housing: None reported Physical health (include injuries & life threatening diseases): None reported Social relationships: Previously noted that pt shared that his self isolations lead to him feeling lonely. Substance abuse: Pt reports once weekly use of alcohol. Bereavement / Loss: None reported  Living/Environment/Situation:  Living Arrangements: Parent Living conditions (as described by patient or guardian): Previously noted as within normal limits. Who else lives in the home?: Pt's mother and brother. How long has patient lived in current situation?: "Maybe two years. Nah, I been staying with them my entire life." What is atmosphere in current home: Comfortable  Family History:  Marital status: Single Are you sexually active?: No What is your sexual orientation?: Heterosexual Has your sexual activity been affected by drugs, alcohol, medication, or emotional stress?: No Does patient have children?: No  Childhood History:  By whom was/is the patient raised?: Mother Additional childhood history information: All of pt's family lives in [place], [place].  It has just been him, his mother, and younger brother living in the [place]; reports mother left his father when he was [age] years of age. Description of patient's relationship with caregiver  when they were a child: Describes his relationship with his mother as a child as "safe and fun . . . she gave me everything I needed . . . too much coddling and I was spoiled." Patient's description of current relationship with people who raised him/her: "Good" How were you disciplined when you got in trouble as a child/adolescent?: Patient reports he was not disciplined as a child. Does patient have siblings?: Yes Number of Siblings: 1 (Previously noted that pt has four siblings however, he reported one during this interaction) Description of patient's current relationship with siblings: "Good" Did patient suffer any verbal/emotional/physical/sexual abuse as a child?: No Did patient suffer from severe childhood neglect?: No Has patient ever been sexually abused/assaulted/raped as an adolescent or adult?: No Was the patient ever a victim of a crime or a disaster?: No Witnessed domestic violence?: No Has patient been affected by domestic violence as an adult?: No  Education:  Highest grade of school patient has completed: 10th grade Currently a student?: No Learning disability?: No  Employment/Work Situation:   Employment Situation: Unemployed (Reports are inconsistent whether pt is currently on disability, prior reprts state pt is on disability. He also stated that he was not on disability due to "pride". Noted to have been unemployed for the past year per previous assessment.) Patient's Job has Been Impacted by Current Illness: No What is the Longest Time Patient has Held a Job?: Two or three years per pt. Previously noted to have reported only one year and at other points three years. Where was the Patient Employed at that Time?: [employer] per pt. However, per previous PSA he reported [facility] [employer]. Has Patient ever Been in the [employer]?: No  Financial Resources:   Financial resources: Medicaid Does patient have a representative payee or guardian?:  No  Alcohol/Substance Abuse:    What has been your use of drugs/alcohol within the last 12 months?: He shares that he has been drinking a "couple of shots" once weekly. If attempted suicide, did drugs/alcohol play a role in this?: No Alcohol/Substance Abuse Treatment Hx: Denies past history If yes, describe treatment: N/A Has alcohol/substance abuse ever caused legal problems?: No  Social Support System:   Patient's Community Support System: Good Describe Community Support System: "My mother and my brother." Type of faith/religion: Pt describes himself as spiritual. How does patient's faith help to cope with current illness?: "No, not really."  Leisure/Recreation:   Do You Have Hobbies?: Yes Leisure and Hobbies: Reports he enjoys listening to music  Strengths/Needs:   Patient states these barriers may affect/interfere with their treatment: None reported Patient states these barriers may affect their return to the community: None reported Other important information patient would like considered in planning for their treatment: None reported  Discharge Plan:   Currently receiving community mental health services: Yes (From Whom) [name]) Patient states concerns and preferences for aftercare planning are: Pt plans to continue [facility] services with [name] Does patient have access to transportation?: No Does patient have financial barriers related to discharge medications?: No Plan for no access to transportation at discharge: CSW will assist pt with transportation arrangements as necessary. Will patient be returning to same living situation after discharge?: Yes  Summary/Recommendations:   Summary and Recommendations (to be completed by the evaluator): Pt is a 27 year old, single, male from [place], [place] [facility] [place]). He shared that he is in the hospital because he had an episode. Pt stated a goal of "getting better and leaving as soon as possible." was loitering outside a gas station and began to  think that someone had injected him with the meningitis shot. Pt stated that his goal is discharge home as soon as possible. He appears to realize that his thoughts at the gas station were just that. Pt denied any history of abuse, trauma, or substance use. Some discrepancies present between information given in previous assessments and during this interaction around family members here in the [place], work history, and substance use. He lives with his mother and younger brother, to which he plans to return upon discharge. Pt receives outpatient mental health services through [facility]. Upon discharge, pt plans to continue services through [facility]. Recommendations include: crisis stabilization, therapeutic milieu, encourage group attendance and participation, medication management for mood stabilization and development of a comprehensive mental wellness plan.  Glenis Smoker. 11/06/2022

## 2022-11-06 NOTE — BH IP Treatment Plan (Signed)
Interdisciplinary Treatment and Diagnostic Plan Update  11/06/2022 Time of Session: 08:55 [name] [name] MRN: [id number]  Principal Diagnosis: Schizoaffective disorder, bipolar type  Secondary Diagnoses: Principal Problem:   Schizoaffective disorder, bipolar type   Current Medications:  Current Facility-Administered Medications  Medication Dose Route Frequency Provider Last Rate Last Admin   acetaminophen (TYLENOL) tablet 650 mg  650 mg Oral Q6H PRN Bennett, Christal H, NP   650 mg at 11/06/22 0009   amLODipine (NORVASC) tablet 10 mg  10 mg Oral Daily Bennett, Christal H, NP   10 mg at 11/05/22 1234   calcium carbonate (TUMS - dosed in mg elemental calcium) chewable tablet 400 mg of elemental calcium  400 mg of elemental calcium Oral Q4H PRN Bennett, Christal H, NP       diphenhydrAMINE (BENADRYL) capsule 50 mg  50 mg Oral TID PRN Bennett, Christal H, NP   50 mg at 11/05/22 2251   Or   diphenhydrAMINE (BENADRYL) injection 50 mg  50 mg Intramuscular TID PRN Bennett, Christal H, NP       divalproex (DEPAKOTE ER) 24 hr tablet 500 mg  500 mg Oral BID Bennett, Christal H, NP   500 mg at 11/05/22 1728   haloperidol (HALDOL) tablet 5 mg  5 mg Oral QHS Bennett, Christal H, NP   5 mg at 11/05/22 2020   haloperidol (HALDOL) tablet 5 mg  5 mg Oral TID PRN Thurston Hole H, NP   5 mg at 11/05/22 2251   Or   haloperidol lactate (HALDOL) injection 5 mg  5 mg Intramuscular TID PRN Bennett, Christal H, NP       hydrOXYzine (ATARAX) tablet 50 mg  50 mg Oral TID PRN Bennett, Christal H, NP   50 mg at 11/05/22 2020   LORazepam (ATIVAN) tablet 2 mg  2 mg Oral TID PRN Thurston Hole H, NP   2 mg at 11/05/22 2251   Or   LORazepam (ATIVAN) injection 2 mg  2 mg Intramuscular TID PRN Bennett, Christal H, NP       nicotine (NICODERM CQ - dosed in mg/24 hours) patch 21 mg  21 mg Transdermal Daily [name], [name], MD   21 mg at 11/05/22 1728   nicotine polacrilex (NICORETTE) gum 2 mg  2 mg Oral PRN  [name], [name], MD   2 mg at 11/04/22 1709   paliperidone (INVEGA) 24 hr tablet 6 mg  6 mg Oral Daily Bennett, Christal H, NP   6 mg at 11/05/22 1233   traZODone (DESYREL) tablet 100 mg  100 mg Oral QHS Bennett, Christal H, NP   100 mg at 11/05/22 2020   PTA Medications: Medications Prior to Admission  Medication Sig Dispense Refill Last Dose   amLODipine (NORVASC) 10 MG tablet Take 1 tablet (10 mg total) by mouth daily. (Patient not taking: Reported on 05/22/2022) 30 tablet 1    chlorproMAZINE (THORAZINE) 25 MG tablet Take 25 mg by mouth 3 (three) times daily.      cloZAPine (CLOZARIL) 100 MG tablet Take 300 mg by mouth at bedtime.      divalproex (DEPAKOTE ER) 500 MG 24 hr tablet Take 1 tablet (500 mg total) by mouth 2 (two) times daily. 20 tablet 0    glycopyrrolate (ROBINUL) 2 MG tablet Take 2 mg by mouth at bedtime.      haloperidol (HALDOL) 5 MG tablet Take 1 tablet (5 mg total) by mouth at bedtime. (Patient not taking: Reported on 11/04/2022) 10 tablet  0    hydrOXYzine (ATARAX) 50 MG tablet Take 1 tablet (50 mg total) by mouth 3 (three) times daily as needed for anxiety. (Patient not taking: Reported on 05/22/2022) 30 tablet 1    loxapine (LOXITANE) 10 MG capsule Take 10 mg by mouth 2 (two) times daily. (Patient not taking: Reported on 11/04/2022)      paliperidone (INVEGA SUSTENNA) 234 MG/1.5ML injection Inject 234 mg into the muscle every 28 (twenty-eight) days. 1.8 mL 1    paliperidone (INVEGA) 3 MG 24 hr tablet Take 2 tablets (6 mg total) by mouth daily. (Patient not taking: Reported on 11/04/2022) 20 tablet 0    traZODone (DESYREL) 100 MG tablet Take 1 tablet (100 mg total) by mouth at bedtime. 10 tablet 0     Patient Stressors: Medication change or noncompliance    Patient Strengths: Motivation for treatment/growth  Supportive family/friends   Treatment Modalities: Medication Management, Group therapy, Case management,  1 to 1 session with clinician, Psychoeducation, Recreational  therapy.   Physician Treatment Plan for Primary Diagnosis: Schizoaffective disorder, bipolar type Long Term Goal(s): Improvement in symptoms so as ready for discharge   Short Term Goals: Compliance with prescribed medications will improve Ability to verbalize feelings will improve Ability to disclose and discuss suicidal ideas Ability to demonstrate self-control will improve  Medication Management: Evaluate patient's response, side effects, and tolerance of medication regimen.  Therapeutic Interventions: 1 to 1 sessions, Unit Group sessions and Medication administration.  Evaluation of Outcomes: Not Met  Physician Treatment Plan for Secondary Diagnosis: Principal Problem:   Schizoaffective disorder, bipolar type  Long Term Goal(s): Improvement in symptoms so as ready for discharge   Short Term Goals: Compliance with prescribed medications will improve Ability to verbalize feelings will improve Ability to disclose and discuss suicidal ideas Ability to demonstrate self-control will improve     Medication Management: Evaluate patient's response, side effects, and tolerance of medication regimen.  Therapeutic Interventions: 1 to 1 sessions, Unit Group sessions and Medication administration.  Evaluation of Outcomes: Not Met   RN Treatment Plan for Primary Diagnosis: Schizoaffective disorder, bipolar type Long Term Goal(s): Knowledge of disease and therapeutic regimen to maintain health will improve  Short Term Goals: Ability to remain free from injury will improve, Ability to verbalize frustration and anger appropriately will improve, Ability to demonstrate self-control, Ability to participate in decision making will improve, Ability to verbalize feelings will improve, Ability to disclose and discuss suicidal ideas, Ability to identify and develop effective coping behaviors will improve, and Compliance with prescribed medications will improve  Medication Management: RN will  administer medications as ordered by provider, will assess and evaluate patient's response and provide education to patient for prescribed medication. RN will report any adverse and/or side effects to prescribing provider.  Therapeutic Interventions: 1 on 1 counseling sessions, Psychoeducation, Medication administration, Evaluate responses to treatment, Monitor vital signs and CBGs as ordered, Perform/monitor CIWA, COWS, AIMS and Fall Risk screenings as ordered, Perform wound care treatments as ordered.  Evaluation of Outcomes: Not Met   LCSW Treatment Plan for Primary Diagnosis: Schizoaffective disorder, bipolar type Long Term Goal(s): Safe transition to appropriate next level of care at discharge, Engage patient in therapeutic group addressing interpersonal concerns.  Short Term Goals: Engage patient in aftercare planning with referrals and resources, Increase social support, Increase ability to appropriately verbalize feelings, Increase emotional regulation, Facilitate acceptance of mental health diagnosis and concerns, Facilitate patient progression through stages of change regarding substance use diagnoses and concerns, Identify triggers  associated with mental health/substance abuse issues, and Increase skills for wellness and recovery  Therapeutic Interventions: Assess for all discharge needs, 1 to 1 time with Social worker, Explore available resources and support systems, Assess for adequacy in community support network, Educate family and significant other(s) on suicide prevention, Complete Psychosocial Assessment, Interpersonal group therapy.  Evaluation of Outcomes: Not Met   Progress in Treatment: Attending groups: No. Participating in groups: No. Taking medication as prescribed: Yes. Toleration medication: Yes. Family/Significant other contact made: No, will contact:  mother, [name]. Patient understands diagnosis: Yes. Discussing patient identified problems/goals with staff:  Yes. Medical problems stabilized or resolved: Yes. Denies suicidal/homicidal ideation: Yes. Issues/concerns per patient self-inventory: No. Other: none.  New problem(s) identified: No, Describe:  none identified.  New Short Term/Long Term Goal(s): elimination of symptoms of psychosis, medication management for mood stabilization; elimination of SI thoughts; development of comprehensive mental wellness/sobriety plan.  Patient Goals:  Pt declined to attend treatment team meeting despite personal invitation from staff nurse. He has been complaining about lightheadedness.   Discharge Plan or Barriers: CSW will assist pt with development of an appropriate aftercare/discharge plan.   Reason for Continuation of Hospitalization: Delusions  Hallucinations Medication stabilization Suicidal ideation  Estimated Length of Stay: 1-7 days  Last 3 [place] Suicide Severity Risk Score: Flowsheet Row Admission (Current) from 11/04/2022 in [facility] INPATIENT BEHAV[facility]AL MEDICINE ED from 11/03/2022 in [facility] Emergency Department at [facility] ED from 05/21/2022 in [facility] Emergency Department at [facility]  C-SSRS RISK CATEGORY No Risk No Risk No Risk       Last PHQ 2/9 Scores:     No data to display          Scribe for Treatment Team: Glenis Smoker, LCSW 11/06/2022 9:34 AM

## 2022-11-06 NOTE — Progress Notes (Signed)
Patient tolerated [name] [name] Sustenna injection, to his right deltoid, well without any issues. Patient remains safe at this time.

## 2022-11-06 NOTE — Progress Notes (Signed)
Patient calm and pleasant during assessment denying SI/HI/AVH. Pt observed interacting appropriately with staff and peers on the unit. Pt compliant with medication administration per MD orders. Pt given education, support, and encouragement to be active in his treatment plan. Pt being monitored Q 15 minutes for safety per unit protocol, remains safe on the unit  

## 2022-11-06 NOTE — [facility] Suicide Risk Assessment (Signed)
[facility] INPATIENT:  Family/Significant Other Suicide Prevention Education  Suicide Prevention Education:  Contact Attempts: [name] Babikir/mother [phone]), has been identified by the patient as the family member/significant other with whom the patient will be residing, and identified as the person(s) who will aid the patient in the event of a mental health crisis.  With written consent from the patient, two attempts were made to provide suicide prevention education, prior to and/or following the patient's discharge.  We were unsuccessful in providing suicide prevention education.  A suicide education pamphlet was given to the patient to share with family/significant other.  Date and time of first attempt: 11/06/22 at 16:16 Date and time of second attempt: Second attempt needed.   CSW was able to leave HIPAA compliant voicemail with contact information for follow through.  Glenis Smoker 11/06/2022, 4:24 PM

## 2022-11-06 NOTE — Progress Notes (Signed)
Patient was walking down the hall, going towards the dayroom. This Clinical research associate asked patient if he was going to come get his medication, and he said "yes".

## 2022-11-06 NOTE — Progress Notes (Signed)
After speaking with MD, this writer moved patient's 1700 Depakote to 2000, being that patient didn't receive his first dose until 1312.

## 2022-11-06 NOTE — Progress Notes (Addendum)
This writer went to get patient, he was lying in bed, and when asked if he was going to come up to the medication room, he said "in thirty minutes". This Clinical research associate will attempt to check with patient later on. MD will be notified.

## 2022-11-06 NOTE — Plan of Care (Signed)
D- Patient alert and oriented. Patient presented in a pleasant mood on assessment reporting that he slept "good" last night and had complaints of dizziness first thing this morning, in which he stated "I was still sleep walking". Patient denied SI, HI, AVH, and pain at this time. Patient also denied signs/symptoms of depression and anxiety. Patient's stated goal for today is "to have a good day".  A- Scheduled medications administered to patient, per MD orders. Support and encouragement provided.  Routine safety checks conducted every 15 minutes.  Patient informed to notify staff with problems or concerns.  R- No adverse drug reactions noted. Patient contracts for safety at this time. Patient compliant with medications. Patient receptive, calm, and cooperative. Patient isolates to room, except for meals although he does come out and pace at times. Patient remains safe at this time.  Problem: Education: Goal: Knowledge of General Education information will improve Description: Including pain rating scale, medication(s)/side effects and non-pharmacologic comfort measures Outcome: Progressing   Problem: Health Behavior/Discharge Planning: Goal: Ability to manage health-related needs will improve Outcome: Progressing   Problem: Clinical Measurements: Goal: Ability to maintain clinical measurements within normal limits will improve Outcome: Progressing Goal: Will remain free from infection Outcome: Progressing Goal: Diagnostic test results will improve Outcome: Progressing Goal: Respiratory complications will improve Outcome: Progressing Goal: Cardiovascular complication will be avoided Outcome: Progressing   Problem: Activity: Goal: Risk for activity intolerance will decrease Outcome: Progressing   Problem: Nutrition: Goal: Adequate nutrition will be maintained Outcome: Progressing   Problem: Coping: Goal: Level of anxiety will decrease Outcome: Progressing   Problem:  Elimination: Goal: Will not experience complications related to bowel motility Outcome: Progressing Goal: Will not experience complications related to urinary retention Outcome: Progressing   Problem: Pain Managment: Goal: General experience of comfort will improve Outcome: Progressing   Problem: Safety: Goal: Ability to remain free from injury will improve Outcome: Progressing   Problem: Skin Integrity: Goal: Risk for impaired skin integrity will decrease Outcome: Progressing   Problem: Education: Goal: Knowledge of [place] [facility] Education information/materials will improve Outcome: Progressing Goal: Emotional status will improve Outcome: Progressing Goal: Mental status will improve Outcome: Progressing Goal: Verbalization of understanding the information provided will improve Outcome: Progressing   Problem: Safety: Goal: Periods of time without injury will increase Outcome: Progressing   Problem: Self-Concept: Goal: Ability to disclose and discuss suicidal ideas will improve Outcome: Progressing Goal: Will verbalize positive feelings about self Outcome: Progressing   Problem: Education: Goal: Will be free of psychotic symptoms Outcome: Progressing Goal: Knowledge of the prescribed therapeutic regimen will improve Outcome: Progressing   Problem: Coping: Goal: Coping ability will improve Outcome: Progressing Goal: Will verbalize feelings Outcome: Progressing

## 2022-11-06 NOTE — Progress Notes (Signed)
   11/06/22 0000  Psych Admission Type (Psych Patients Only)  Admission Status Involuntary  Psychosocial Assessment  Patient Complaints Anxiety;Irritability  Eye Contact Brief;Watchful  Facial Expression Wide-eyed  Affect Preoccupied  Speech Soft  Interaction Assertive  Motor Activity Slow  Appearance/Hygiene Body odor;Disheveled  Behavior Characteristics Agitated  Mood Suspicious  Thought Process  Coherency Circumstantial;Blocking  Content WDL  Delusions None reported or observed  Perception WDL  Hallucination None reported or observed  Judgment Limited  Confusion None  Danger to Self  Current suicidal ideation? Denies (Denies)  Agreement Not to Harm Self Yes  Description of Agreement verbal  Danger to Others  Danger to Others None reported or observed   Patient continues to be watchful has been feeling anxious this shift requested for prn vistaril at 2020 medication given but reported ineffective. Patient stated feelings of anxiety and agitation he appears blunted and wide eyed prn ativan, haldol and benadryl PO given at 2251 Patient able to relax and went to bed.  Support and encouragement provided. Q 15 minutes safety checks ongoing without self harm gestures.

## 2022-11-06 NOTE — Progress Notes (Signed)
[facility] MD Progress Note  11/06/2022 11:07 AM [name] [name]  MRN:  [id number] Subjective: Follow-up to this 27 year old man with schizoaffective disorder.  Patient seen and chart reviewed.  Still in bed this morning.  Woke up and was able to answer questions.  Denied hallucinations denied suicidal thought.  I did get in touch with his ACT team yesterday and confirmed that his last Invega injection was on 21 March. Principal Problem: Schizoaffective disorder, bipolar type Diagnosis: Principal Problem:   Schizoaffective disorder, bipolar type  Total Time spent with patient: 30 minutes  Past Psychiatric History: Past history of longstanding psychotic disorder  Past Medical History:  Past Medical History:  Diagnosis Date   Asthma    Depression    Psychosis    Schizoaffective disorder     Past Surgical History:  Procedure Laterality Date   BACK SURGERY     I&D groin  2017   Family History: History reviewed. No pertinent family history. Family Psychiatric  History: See previous Social History:  Social History   Substance and Sexual Activity  Alcohol Use No     Social History   Substance and Sexual Activity  Drug Use Yes   Types: Marijuana   Comment: reports cannabis use as funds permit    Social History   Socioeconomic History   Marital status: Single    Spouse name: Not on file   Number of children: Not on file   Years of education: Not on file   Highest education level: Not on file  Occupational History   Not on file  Tobacco Use   Smoking status: Every Day    Packs/day: .25    Types: Cigarettes   Smokeless tobacco: Never  Vaping Use   Vaping Use: Some days  Substance and Sexual Activity   Alcohol use: No   Drug use: Yes    Types: Marijuana    Comment: reports cannabis use as funds permit   Sexual activity: Not Currently    Partners: Female  Other Topics Concern   Not on file  Social History Narrative   Not on file   Social Determinants of Health    Financial Resource Strain: Not on file  Food Insecurity: No Food Insecurity (11/04/2022)   Hunger Vital Sign    Worried About Running Out of Food in the Last Year: Never true    Ran Out of Food in the Last Year: Never true  Transportation Needs: No Transportation Needs (11/04/2022)   PRAPARE - Administrator, Civil Service (Medical): No    Lack of Transportation (Non-Medical): No  Physical Activity: Not on file  Stress: Not on file  Social Connections: Not on file   Additional Social History:                         Sleep: Fair  Appetite:  Fair  Current Medications: Current Facility-Administered Medications  Medication Dose Route Frequency Provider Last Rate Last Admin   acetaminophen (TYLENOL) tablet 650 mg  650 mg Oral Q6H PRN Bennett, Christal H, NP   650 mg at 11/06/22 0009   amLODipine (NORVASC) tablet 10 mg  10 mg Oral Daily Bennett, Christal H, NP   10 mg at 11/05/22 1234   calcium carbonate (TUMS - dosed in mg elemental calcium) chewable tablet 400 mg of elemental calcium  400 mg of elemental calcium Oral Q4H PRN Bennett, Christal H, NP       diphenhydrAMINE (BENADRYL) capsule  50 mg  50 mg Oral TID PRN Willeen Cass, Christal H, NP   50 mg at 11/05/22 2251   Or   diphenhydrAMINE (BENADRYL) injection 50 mg  50 mg Intramuscular TID PRN Bennett, Christal H, NP       divalproex (DEPAKOTE ER) 24 hr tablet 500 mg  500 mg Oral BID Bennett, Christal H, NP   500 mg at 11/05/22 1728   haloperidol (HALDOL) tablet 5 mg  5 mg Oral QHS Bennett, Christal H, NP   5 mg at 11/05/22 2020   haloperidol (HALDOL) tablet 5 mg  5 mg Oral TID PRN Thurston Hole H, NP   5 mg at 11/05/22 2251   Or   haloperidol lactate (HALDOL) injection 5 mg  5 mg Intramuscular TID PRN Bennett, Christal H, NP       hydrOXYzine (ATARAX) tablet 50 mg  50 mg Oral TID PRN Willeen Cass, Christal H, NP   50 mg at 11/05/22 2020   LORazepam (ATIVAN) tablet 2 mg  2 mg Oral TID PRN Thurston Hole H, NP   2  mg at 11/05/22 2251   Or   LORazepam (ATIVAN) injection 2 mg  2 mg Intramuscular TID PRN Bennett, Christal H, NP       nicotine (NICODERM CQ - dosed in mg/24 hours) patch 21 mg  21 mg Transdermal Daily Lani Mendiola T, MD   21 mg at 11/05/22 1728   nicotine polacrilex (NICORETTE) gum 2 mg  2 mg Oral PRN Mashawn Brazil T, MD   2 mg at 11/04/22 1709   paliperidone (INVEGA SUSTENNA) injection 234 mg  234 mg Intramuscular Q28 days Prudie Guthridge T, MD       paliperidone (INVEGA) 24 hr tablet 6 mg  6 mg Oral Daily Bennett, Christal H, NP   6 mg at 11/05/22 1233   traZODone (DESYREL) tablet 100 mg  100 mg Oral QHS Bennett, Christal H, NP   100 mg at 11/05/22 2020    Lab Results: No results found for this or any previous visit (from the past 48 hour(s)).  Blood Alcohol level:  Lab Results  Component Value Date   ETH <10 11/03/2022   ETH <10 05/21/2022    Metabolic Disorder Labs: Lab Results  Component Value Date   HGBA1C 5.1 12/03/2021   MPG 99.67 12/03/2021   MPG 91.06 07/19/2020   No results found for: "PROLACTIN" Lab Results  Component Value Date   CHOL 197 12/06/2021   TRIG 111 12/06/2021   HDL 46 12/06/2021   CHOLHDL 4.3 12/06/2021   VLDL 22 12/06/2021   LDLCALC 129 (H) 12/06/2021   LDLCALC 177 (H) 07/19/2020    Physical Findings: AIMS:  , ,  ,  ,    CIWA:    COWS:     Musculoskeletal: Strength & Muscle Tone: within normal limits Gait & Station: normal Patient leans: N/A  Psychiatric Specialty Exam:  Presentation  General Appearance:  Bizarre  Eye Contact: Good  Speech: Slow  Speech Volume: Normal  Handedness: Right   Mood and Affect  Mood: Euthymic  Affect: Congruent   Thought Process  Thought Processes: Irrevelant  Descriptions of Associations:Circumstantial  Orientation:Full (Time, Place and Person)  Thought Content:Obsessions  History of Schizophrenia/Schizoaffective disorder:Yes  Duration of Psychotic Symptoms:Greater than six  months  Hallucinations:No data recorded Ideas of Reference:None  Suicidal Thoughts:No data recorded Homicidal Thoughts:No data recorded  Sensorium  Memory: Immediate Fair; Recent Fair; Remote Fair  Judgment: Poor  Insight: Poor   Executive  Functions  Concentration: Poor  Attention Span: Poor  Recall: Poor  Fund of Knowledge: Poor  Language: Poor   Psychomotor Activity  Psychomotor Activity:No data recorded  Assets  Assets: Communication Skills; Financial Resources/Insurance; Resilience; Social Support   Sleep  Sleep:No data recorded   Physical Exam: Physical Exam Vitals and nursing note reviewed.  Constitutional:      Appearance: Normal appearance.  HENT:     Head: Normocephalic and atraumatic.     Mouth/Throat:     Pharynx: Oropharynx is clear.  Eyes:     Pupils: Pupils are equal, round, and reactive to light.  Cardiovascular:     Rate and Rhythm: Normal rate and regular rhythm.  Pulmonary:     Effort: Pulmonary effort is normal.     Breath sounds: Normal breath sounds.  Abdominal:     General: Abdomen is flat.     Palpations: Abdomen is soft.  Musculoskeletal:        General: Normal range of motion.  Skin:    General: Skin is warm and dry.  Neurological:     General: No focal deficit present.     Mental Status: He is alert. Mental status is at baseline.  Psychiatric:        Attention and Perception: He is inattentive.        Mood and Affect: Mood normal. Affect is blunt.        Speech: Speech is delayed.        Behavior: Behavior is slowed.        Thought Content: Thought content normal.        Cognition and Memory: Cognition is impaired.    Review of Systems  Constitutional: Negative.   HENT: Negative.    Eyes: Negative.   Respiratory: Negative.    Cardiovascular: Negative.   Gastrointestinal: Negative.   Musculoskeletal: Negative.   Skin: Negative.   Neurological: Negative.   Psychiatric/Behavioral: Negative.     Blood  pressure (!) 133/95, pulse 95, temperature 98.3 F (36.8 C), resp. rate 18, height 6' (1.829 m), weight 109.8 kg, SpO2 97 %. Body mass index is 32.82 kg/m.   Treatment Plan Summary: Medication management and Plan I see no reason waiting around to get him his Invega shot since we know how psychotic he had been.  Ordered the [place] shot today.  He is on the larger dose on a q. 28-day cycle.  No other change to medicine.  Encourage group attendance and being out of bed.  Ongoing assessment of dangerousness possible discharge in the next couple days if he stays stable.  Mordecai Rasmussen, MD 11/06/2022, 11:07 AM

## 2022-11-06 NOTE — Group Note (Signed)
[facility] LCSW Group Therapy Note   Group Date: 11/06/2022 Start Time: 1315 End Time: 1415   Type of Therapy/Topic:  Group Therapy:  Balance in Life  Participation Level:  None   Description of Group:    This group will address the concept of balance and how it feels and looks when one is unbalanced. Patients will be encouraged to process areas in their lives that are out of balance, and identify reasons for remaining unbalanced. Facilitators will guide patients utilizing problem- solving interventions to address and correct the stressor making their life unbalanced. Understanding and applying boundaries will be explored and addressed for obtaining  and maintaining a balanced life. Patients will be encouraged to explore ways to assertively make their unbalanced needs known to significant others in their lives, using other group members and facilitator for support and feedback.  Therapeutic Goals: Patient will identify two or more emotions or situations they have that consume much of in their lives. Patient will identify signs/triggers that life has become out of balance:  Patient will identify two ways to set boundaries in order to achieve balance in their lives:  Patient will demonstrate ability to communicate their needs through discussion and/or role plays  Summary of Patient Progress: Patient came into the group room for about five minutes. During his time in the room he did not participate in the discussion.    Therapeutic Modalities:   Cognitive Behavioral Therapy Solution-Focused Therapy Assertiveness Training   Glenis Smoker, LCSW

## 2022-11-06 NOTE — Group Note (Signed)
Recreation Therapy Group Note   Group Topic:Coping Skills  Group Date: 11/06/2022 Start Time: 1000 End Time: 1100 Facilitators: Rosina Lowenstein, LRT, CTRS Location:  Craft Room  Group Description: Mind Map.  Patient was provided a blank template of a diagram with 32 blank boxes in a tiered system, branching from the center (similar to a bubble chart). LRT directed patients to label the middle of the diagram "Coping Skills". LRT and patients then came up with 8 different coping skills as examples. Pt were directed to record their coping skills in the 2nd tier boxes closest to the center.  Patients would then share their coping skills with the group as LRT wrote them out. LRT gave a handout of 100 different coping skills at the end of group.   Goal Area(s) Addressed: Patients will be able to define "coping skills". Patient will identify new coping skills.  Patient will identify new possible leisure interests.    Affect/Mood: N/A   Participation Level: Did not attend    Clinical Observations/Individualized Feedback: [name] [name] did not attend group due to resting in his room.  Plan: Continue to engage patient in RT group sessions 2-3x/week.   Rosina Lowenstein, LRT, CTRS 11/06/2022 12:34 PM

## 2022-11-07 DIAGNOSIS — F250 Schizoaffective disorder, bipolar type: Secondary | ICD-10-CM | POA: Diagnosis not present

## 2022-11-07 MED ORDER — PROPRANOLOL HCL 20 MG PO TABS
10.0000 mg | ORAL_TABLET | Freq: Three times a day (TID) | ORAL | Status: DC
  Administered 2022-11-07 – 2022-11-08 (×3): 10 mg via ORAL
  Filled 2022-11-07 (×3): qty 1

## 2022-11-07 MED ORDER — BENZOCAINE 10 % MT GEL
Freq: Three times a day (TID) | OROMUCOSAL | Status: DC | PRN
  Filled 2022-11-07: qty 9

## 2022-11-07 MED ORDER — CLONAZEPAM 0.5 MG PO TABS
0.5000 mg | ORAL_TABLET | Freq: Two times a day (BID) | ORAL | Status: DC
  Administered 2022-11-07 – 2022-11-08 (×2): 0.5 mg via ORAL
  Filled 2022-11-07 (×2): qty 1

## 2022-11-07 NOTE — [facility] Suicide Risk Assessment (Signed)
[facility] INPATIENT:  Family/Significant Other Suicide Prevention Education  Suicide Prevention Education:  Education Completed; International aid/development worker [phone]), has been identified by the patient as the family member/significant other with whom the patient will be residing, and identified as the person(s) who will aid the patient in the event of a mental health crisis (suicidal ideations/suicide attempt).  With written consent from the patient, the family member/significant other has been provided the following suicide prevention education, prior to the and/or following the discharge of the patient.  The suicide prevention education provided includes the following: Suicide risk factors Suicide prevention and interventions National Suicide Hotline telephone number [facility] assessment telephone number [facility] Emergency Assistance 911 [facility] and/or Residential Mobile Crisis Unit telephone number  Request made of family/significant other to: Remove weapons (e.g., guns, rifles, knives), all items previously/currently identified as safety concern.   Remove drugs/medications (over-the-counter, prescriptions, illicit drugs), all items previously/currently identified as a safety concern.  The family member/significant other verbalizes understanding of the suicide prevention education information provided.  The family member/significant other agrees to remove the items of safety concern listed above.  Babikir shared that she does not really know what brought pt into the hospital. She stated that pt had been taking his medication. However, she acknowledged that due to pt just being at home, he had begun to pick up drinking alcohol. She shared that pt had asked for keys to the car but had been informed that the battery was dead. Babikir inquired about what was written about his admission. She was updated regarding the initial triage note in which pt was brought in due to running  in the road and kept repeating "kill me." Mother was surprised to hear this. She inquired how he was being discharged when he was making suicidal statements. CSW explained that pt came in closer to baseline than he typically is when he is admitted. CSW shared that pt has gotten a lot better during his time here and is back at baseline. She agreed. She asked if he could have transportation to get home as their car is not currently operational. CSW informed her that he would be assisted with transportation. Babikir denied feeling that pt is a danger to himself or anyone else. She also denied him having access to any weapons. CSW advised that alcohol/substances in general impact the effectiveness of medications. She also reported that pt has been using marijuana. Babikir requested that pt be set up with follow up through [facility]. CSW informed her that he would have an appointment set up with them prior to discharge. No other concerns expressed. Contact ended without incident.    Glenis Smoker 11/07/2022, 2:48 PM

## 2022-11-07 NOTE — Group Note (Signed)
[facility] LCSW Group Therapy Note   Group Date: 11/07/2022 Start Time: 1310 End Time: 1410  Type of Therapy and Topic:  Group Therapy:  Feelings around Relapse and Recovery  Participation Level:  Active    Description of Group:    Patients in this group will discuss emotions they experience before and after a relapse. They will process how experiencing these feelings, or avoidance of experiencing them, relates to having a relapse. Facilitator will guide patients to explore emotions they have related to recovery. Patients will be encouraged to process which emotions are more powerful. They will be guided to discuss the emotional reaction significant others in their lives may have to patients' relapse or recovery. Patients will be assisted in exploring ways to respond to the emotions of others without this contributing to a relapse.  Therapeutic Goals: Patient will identify two or more emotions that lead to relapse for them:  Patient will identify two emotions that result when they relapse:  Patient will identify two emotions related to recovery:  Patient will demonstrate ability to communicate their needs through discussion and/or role plays.   Summary of Patient Progress: Patient was present for the majority of the group process. He shared that he is trying to stop coming back to the hospital and stated that this is his last admission. Pt stated that delusional thinking is a warning sign that he is headed towards relapse. He shared that after discharge he needs to focus on not getting caught up in his own head because this often leaves him focused on negative thoughts. Pt has little insight into the topic. He presents as open and receptive to feedback/comments from both peers and facilitator.    Therapeutic Modalities:   Cognitive Behavioral Therapy Solution-Focused Therapy Assertiveness Training Relapse Prevention Therapy   Glenis Smoker, LCSW

## 2022-11-07 NOTE — Progress Notes (Signed)
[facility] MD Progress Note  11/07/2022 11:07 AM [name] [name]  MRN:  [id number] Subjective: Follow-up 27 year old man with schizophrenia.  Sleeping late today but when I woke him up had no complaints.  Denied suicidal thoughts and denied hallucinations.  Got his Invega injection yesterday and has tolerated it fine. Principal Problem: Schizoaffective disorder, bipolar type Diagnosis: Principal Problem:   Schizoaffective disorder, bipolar type  Total Time spent with patient: 30 minutes  Past Psychiatric History: Past history of schizoaffective disorder or schizophrenia.  Past Medical History:  Past Medical History:  Diagnosis Date   Asthma    Depression    Psychosis    Schizoaffective disorder     Past Surgical History:  Procedure Laterality Date   BACK SURGERY     I&D groin  2017   Family History: History reviewed. No pertinent family history. Family Psychiatric  History: See previous Social History:  Social History   Substance and Sexual Activity  Alcohol Use No     Social History   Substance and Sexual Activity  Drug Use Yes   Types: Marijuana   Comment: reports cannabis use as funds permit    Social History   Socioeconomic History   Marital status: Single    Spouse name: Not on file   Number of children: Not on file   Years of education: Not on file   Highest education level: Not on file  Occupational History   Not on file  Tobacco Use   Smoking status: Every Day    Packs/day: .25    Types: Cigarettes   Smokeless tobacco: Never  Vaping Use   Vaping Use: Some days  Substance and Sexual Activity   Alcohol use: No   Drug use: Yes    Types: Marijuana    Comment: reports cannabis use as funds permit   Sexual activity: Not Currently    Partners: Female  Other Topics Concern   Not on file  Social History Narrative   Not on file   Social Determinants of Health   Financial Resource Strain: Not on file  Food Insecurity: No Food Insecurity (11/04/2022)    Hunger Vital Sign    Worried About Running Out of Food in the Last Year: Never true    Ran Out of Food in the Last Year: Never true  Transportation Needs: No Transportation Needs (11/04/2022)   PRAPARE - Administrator, Civil Service (Medical): No    Lack of Transportation (Non-Medical): No  Physical Activity: Not on file  Stress: Not on file  Social Connections: Not on file   Additional Social History:                         Sleep: Fair  Appetite:  Fair  Current Medications: Current Facility-Administered Medications  Medication Dose Route Frequency Provider Last Rate Last Admin   acetaminophen (TYLENOL) tablet 650 mg  650 mg Oral Q6H PRN Bennett, Christal H, NP   650 mg at 11/06/22 0009   amLODipine (NORVASC) tablet 10 mg  10 mg Oral Daily Bennett, Christal H, NP   10 mg at 11/07/22 1037   calcium carbonate (TUMS - dosed in mg elemental calcium) chewable tablet 400 mg of elemental calcium  400 mg of elemental calcium Oral Q4H PRN Bennett, Christal H, NP       diphenhydrAMINE (BENADRYL) capsule 50 mg  50 mg Oral TID PRN Bennett, Christal H, NP   50 mg at 11/06/22 2341  Or   diphenhydrAMINE (BENADRYL) injection 50 mg  50 mg Intramuscular TID PRN Bennett, Christal H, NP       divalproex (DEPAKOTE ER) 24 hr tablet 500 mg  500 mg Oral BID Bennett, Christal H, NP   500 mg at 11/07/22 1027   haloperidol (HALDOL) tablet 5 mg  5 mg Oral QHS Bennett, Christal H, NP   5 mg at 11/06/22 2043   haloperidol (HALDOL) tablet 5 mg  5 mg Oral TID PRN Thurston Hole H, NP   5 mg at 11/06/22 2341   Or   haloperidol lactate (HALDOL) injection 5 mg  5 mg Intramuscular TID PRN Bennett, Christal H, NP       hydrOXYzine (ATARAX) tablet 50 mg  50 mg Oral TID PRN Willeen Cass, Christal H, NP   50 mg at 11/05/22 2020   loperamide (IMODIUM) capsule 2 mg  2 mg Oral PRN Laurianne Floresca, [name], MD       LORazepam (ATIVAN) tablet 2 mg  2 mg Oral TID PRN Willeen Cass, Christal H, NP   2 mg at 11/06/22 2341    Or   LORazepam (ATIVAN) injection 2 mg  2 mg Intramuscular TID PRN Bennett, Christal H, NP       nicotine (NICODERM CQ - dosed in mg/24 hours) patch 21 mg  21 mg Transdermal Daily Jung Yurchak, [name], MD   21 mg at 11/07/22 1038   nicotine polacrilex (NICORETTE) gum 2 mg  2 mg Oral PRN Yomayra Tate, [name], MD   2 mg at 11/04/22 1709   paliperidone (INVEGA SUSTENNA) injection 234 mg  234 mg Intramuscular Q28 days Burr Soffer, [name], MD   234 mg at 11/06/22 1805   paliperidone (INVEGA) 24 hr tablet 6 mg  6 mg Oral Daily Bennett, Christal H, NP   6 mg at 11/07/22 1027   traZODone (DESYREL) tablet 100 mg  100 mg Oral QHS Bennett, Christal H, NP   100 mg at 11/06/22 2043    Lab Results:  Results for orders placed or performed during the hospital encounter of 11/04/22 (from the past 48 hour(s))  Lipid panel     Status: Abnormal   Collection Time: 11/06/22 11:03 AM  Result Value Ref Range   Cholesterol 181 0 - 200 mg/dL   Triglycerides 161 <096 mg/dL   HDL 49 >04 mg/dL   Total CHOL/HDL Ratio 3.7 RATIO   VLDL 23 0 - 40 mg/dL   LDL Cholesterol 540 (H) 0 - 99 mg/dL    Comment:        Total Cholesterol/HDL:CHD Risk Coronary Heart Disease Risk Table                     Men   Women  1/2 Average Risk   3.4   3.3  Average Risk       5.0   4.4  2 X Average Risk   9.6   7.1  3 X Average Risk  23.4   11.0        Use the calculated Patient Ratio above and the CHD Risk Table to determine the patient's CHD Risk.        ATP III CLASSIFICATION (LDL):  <100     mg/dL   Optimal  981-191  mg/dL   Near or Above                    Optimal  130-159  mg/dL   Borderline  478-295  mg/dL  High  >190     mg/dL   Very High Performed at [facility], [address] [place], [place] [address]   Hemoglobin A1c     Status: None   Collection Time: 11/06/22 11:03 AM  Result Value Ref Range   Hgb A1c MFr Bld 4.8 4.8 - 5.6 %    Comment: (NOTE) Pre diabetes:          5.7%-6.4%  Diabetes:               >6.4%  Glycemic control for   <7.0% adults with diabetes    Mean Plasma Glucose 91.06 mg/dL    Comment: Performed at [facility], [address] [place], [place] [address]    Blood Alcohol level:  Lab Results  Component Value Date   ETH <10 11/03/2022   ETH <10 05/21/2022    Metabolic Disorder Labs: Lab Results  Component Value Date   HGBA1C 4.8 11/06/2022   MPG 91.06 11/06/2022   MPG 99.67 12/03/2021   No results found for: "PROLACTIN" Lab Results  Component Value Date   CHOL 181 11/06/2022   TRIG 117 11/06/2022   HDL 49 11/06/2022   CHOLHDL 3.7 11/06/2022   VLDL 23 11/06/2022   LDLCALC 109 (H) 11/06/2022   LDLCALC 129 (H) 12/06/2021    Physical Findings: AIMS:  , ,  ,  ,    CIWA:    COWS:     Musculoskeletal: Strength & Muscle Tone: within normal limits Gait & Station: normal Patient leans: N/A  Psychiatric Specialty Exam:  Presentation  General Appearance:  Bizarre  Eye Contact: Good  Speech: Slow  Speech Volume: Normal  Handedness: Right   Mood and Affect  Mood: Euthymic  Affect: Congruent   Thought Process  Thought Processes: Irrevelant  Descriptions of Associations:Circumstantial  Orientation:Full (Time, Place and Person)  Thought Content:Obsessions  History of Schizophrenia/Schizoaffective disorder:Yes  Duration of Psychotic Symptoms:Greater than six months  Hallucinations:No data recorded Ideas of Reference:None  Suicidal Thoughts:No data recorded Homicidal Thoughts:No data recorded  Sensorium  Memory: Immediate Fair; Recent Fair; Remote Fair  Judgment: Poor  Insight: Poor   Executive Functions  Concentration: Poor  Attention Span: Poor  Recall: Poor  Fund of Knowledge: Poor  Language: Poor   Psychomotor Activity  Psychomotor Activity:No data recorded  Assets  Assets: Communication Skills; Financial Resources/Insurance; Resilience; Social Support   Sleep  Sleep:No data  recorded   Physical Exam: Physical Exam Vitals and nursing note reviewed.  Constitutional:      Appearance: Normal appearance.  HENT:     Head: Normocephalic and atraumatic.     Mouth/Throat:     Pharynx: Oropharynx is clear.  Eyes:     Pupils: Pupils are equal, round, and reactive to light.  Cardiovascular:     Rate and Rhythm: Normal rate and regular rhythm.  Pulmonary:     Effort: Pulmonary effort is normal.     Breath sounds: Normal breath sounds.  Abdominal:     General: Abdomen is flat.     Palpations: Abdomen is soft.  Musculoskeletal:        General: Normal range of motion.  Skin:    General: Skin is warm and dry.  Neurological:     General: No focal deficit present.     Mental Status: He is alert. Mental status is at baseline.  Psychiatric:        Attention and Perception: He is inattentive.  Mood and Affect: Mood normal. Affect is blunt.        Speech: Speech is delayed.        Behavior: Behavior is slowed.        Thought Content: Thought content normal.    Review of Systems  Constitutional: Negative.   HENT: Negative.    Eyes: Negative.   Respiratory: Negative.    Cardiovascular: Negative.   Gastrointestinal: Negative.   Musculoskeletal: Negative.   Skin: Negative.   Neurological: Negative.   Psychiatric/Behavioral: Negative.     Blood pressure (!) 140/89, pulse 99, temperature 98 F (36.7 C), temperature source Oral, resp. rate 18, height 6' (1.829 m), weight 109.8 kg, SpO2 99 %. Body mass index is 32.82 kg/m.   Treatment Plan Summary: Medication management and Plan seems to be improved today pretty much back to his baseline.  Still not clear why he had this bad episode but we have gotten him back on his antipsychotics and he is denying any suicidal ideation now.  Possible discharge as early as tomorrow.  Mordecai Rasmussen, MD 11/07/2022, 11:07 AM

## 2022-11-07 NOTE — Progress Notes (Signed)
Patient calm and pleasant during assessment denying SI/HI/AVH. Pt observed interacting appropriately with staff and peers on the unit. Pt compliant with medication administration per MD orders. Pt given education, support, and encouragement to be active in his treatment plan. Pt being monitored Q 15 minutes for safety per unit protocol, remains safe on the unit  

## 2022-11-07 NOTE — Group Note (Signed)
Recreation Therapy Group Note   Group Topic:Relaxation  Group Date: 11/07/2022 Start Time: 1000 End Time: 1045 Facilitators: Rosina Lowenstein, LRT, CTRS Location:  Craft Room  Group Description: Meditation. LRT asks patients their current level of stress/anxiety from 1-10, with 10 being the highest. LRT educated on the benefits of mindfulness and how it can apply to everyday life post-discharge. LRT and pt's followed along to an audio script of a "guided meditation" video. LRT asked pt their level of stress and anxiety once the prompt was finished. LRT facilitated post-activity processing to gain feedback on session.  Goal Area(s) Addressed:  Patient will practice using relaxation technique. Patient will identify a new coping skill.  Patient will follow multistep directions to reduce anxiety and stress.   Affect/Mood: N/A   Participation Level: Did not attend    Clinical Observations/Individualized Feedback: [name] [name] did not attend group due to resting in his room.  Plan: Continue to engage patient in RT group sessions 2-3x/week.   Rosina Lowenstein, LRT, CTRS 11/07/2022 11:02 AM

## 2022-11-07 NOTE — Group Note (Signed)
Date:  11/07/2022 Time:  2:14 AM  Group Topic/Focus:  Wrap-Up Group:   The focus of this group is to help patients review their daily goal of treatment and discuss progress on daily workbooks.    Participation Level:  Active  Participation Quality:  Appropriate and Attentive  Affect:  Appropriate  Cognitive:  Alert and Appropriate  Insight: Good  Engagement in Group:  Limited  Modes of Intervention:  Limit-setting  Additional Comments:     Kennley Schwandt 11/07/2022, 2:14 AM

## 2022-11-07 NOTE — Progress Notes (Signed)
Pt is calm, cooperative, and compliant with medications.  Pt complains of anxiety and restlessness and received new med orders to control.  Pt denies SI Hi AVH.  Continued monitoring for safety.    11/07/22 1400  Psych Admission Type (Psych Patients Only)  Admission Status Involuntary  Psychosocial Assessment  Patient Complaints Anxiety;Restlessness  Eye Contact Fair  Facial Expression Wide-eyed  Affect Appropriate to circumstance  Speech Soft  Interaction Assertive  Motor Activity Pacing  Appearance/Hygiene Poor hygiene  Behavior Characteristics Cooperative  Mood Pleasant  Aggressive Behavior  Effect No apparent injury  Thought Process  Coherency Disorganized  Content WDL  Delusions None reported or observed  Perception WDL  Hallucination None reported or observed  Judgment WDL  Confusion None  Danger to Self  Current suicidal ideation? Denies  Agreement Not to Harm Self Yes  Description of Agreement verbal  Danger to Others  Danger to Others None reported or observed

## 2022-11-07 NOTE — Group Note (Signed)
Date:  11/07/2022 Time:  11:12 PM  Group Topic/Focus:  Wrap-Up Group:   The focus of this group is to help patients review their daily goal of treatment and discuss progress on daily workbooks.    Participation Level:  Active  Participation Quality:  Appropriate and Attentive  Affect:  Appropriate and Excited  Cognitive:  Appropriate  Insight: Appropriate and Good  Engagement in Group:  Engaged and Improving  Modes of Intervention:  Discussion, Education, Socialization, and Support  Additional Comments:     [name] 11/07/2022, 11:12 PM

## 2022-11-08 DIAGNOSIS — F250 Schizoaffective disorder, bipolar type: Secondary | ICD-10-CM | POA: Diagnosis not present

## 2022-11-08 MED ORDER — PALIPERIDONE ER 3 MG PO TB24: 6.0000 mg | ORAL_TABLET | Freq: Every day | ORAL | 1 refills | Status: DC

## 2022-11-08 MED ORDER — NICOTINE 21 MG/24HR TD PT24: 21.0000 mg | MEDICATED_PATCH | Freq: Every day | TRANSDERMAL | 1 refills | Status: DC

## 2022-11-08 MED ORDER — HALOPERIDOL 5 MG PO TABS: 5.0000 mg | ORAL_TABLET | Freq: Every day | ORAL | 1 refills | Status: DC

## 2022-11-08 MED ORDER — CLONAZEPAM 0.5 MG PO TABS: 0.5000 mg | ORAL_TABLET | Freq: Two times a day (BID) | ORAL | 0 refills | Status: DC

## 2022-11-08 MED ORDER — AMLODIPINE BESYLATE 10 MG PO TABS: 10.0000 mg | ORAL_TABLET | Freq: Every day | ORAL | 1 refills | Status: DC

## 2022-11-08 MED ORDER — TRAZODONE HCL 100 MG PO TABS: 100.0000 mg | ORAL_TABLET | Freq: Every day | ORAL | 1 refills | Status: DC

## 2022-11-08 MED ORDER — NICOTINE POLACRILEX 2 MG MT GUM: 2.0000 mg | CHEWING_GUM | OROMUCOSAL | 1 refills | Status: DC | PRN

## 2022-11-08 MED ORDER — PALIPERIDONE PALMITATE ER 234 MG/1.5ML IM SUSY: 234.0000 mg | PREFILLED_SYRINGE | INTRAMUSCULAR | 1 refills | Status: DC

## 2022-11-08 MED ORDER — DIVALPROEX SODIUM ER 500 MG PO TB24: 500.0000 mg | ORAL_TABLET | Freq: Two times a day (BID) | ORAL | 1 refills | Status: DC

## 2022-11-08 MED ORDER — PROPRANOLOL HCL 10 MG PO TABS: 10.0000 mg | ORAL_TABLET | Freq: Three times a day (TID) | ORAL | 1 refills | Status: DC

## 2022-11-08 NOTE — Progress Notes (Signed)
D- Patient alert and oriented. Patient presented in a pleasant mood on assessment reporting that he slept "good" last night and had no complaints to voice to this Clinical research associate. Patient denied SI, HI, AVH, and pain at this time. Patient also denies any signs/symptoms of depression and anxiety at this time. Patient had no stated goals at this time.  A- Scheduled medications administered to patient, per MD orders. Support and encouragement provided.  Routine safety checks conducted every 15 minutes.  Patient informed to notify staff with problems or concerns.  R- No adverse drug reactions noted. Patient contracts for safety at this time. Patient compliant with medications and treatment plan. Patient receptive, calm, and cooperative. Patient interacts well with others on the unit.  Patient remains safe at this time.

## 2022-11-08 NOTE — BHH Suicide Risk Assessment (Signed)
[facility] Discharge Suicide Risk Assessment   Principal Problem: Schizoaffective disorder, bipolar type Discharge Diagnoses: Principal Problem:   Schizoaffective disorder, bipolar type   Total Time spent with patient: 30 minutes  Musculoskeletal: Strength & Muscle Tone: within normal limits Gait & Station: normal Patient leans: N/A  Psychiatric Specialty Exam  Presentation  General Appearance:  Bizarre  Eye Contact: Good  Speech: Slow  Speech Volume: Normal  Handedness: Right   Mood and Affect  Mood: Euthymic  Duration of Depression Symptoms: N/A  Affect: Congruent   Thought Process  Thought Processes: Irrevelant  Descriptions of Associations:Circumstantial  Orientation:Full (Time, Place and Person)  Thought Content:Obsessions  History of Schizophrenia/Schizoaffective disorder:Yes  Duration of Psychotic Symptoms:Greater than six months  Hallucinations:No data recorded Ideas of Reference:None  Suicidal Thoughts:No data recorded Homicidal Thoughts:No data recorded  Sensorium  Memory: Immediate Fair; Recent Fair; Remote Fair  Judgment: Poor  Insight: Poor   Executive Functions  Concentration: Poor  Attention Span: Poor  Recall: Poor  Fund of Knowledge: Poor  Language: Poor   Psychomotor Activity  Psychomotor Activity:No data recorded  Assets  Assets: Communication Skills; Financial Resources/Insurance; Resilience; Social Support   Sleep  Sleep:No data recorded  Physical Exam: Physical Exam Vitals and nursing note reviewed.  Constitutional:      Appearance: Normal appearance.  HENT:     Head: Normocephalic and atraumatic.     Mouth/Throat:     Pharynx: Oropharynx is clear.  Eyes:     Pupils: Pupils are equal, round, and reactive to light.  Cardiovascular:     Rate and Rhythm: Normal rate and regular rhythm.  Pulmonary:     Effort: Pulmonary effort is normal.     Breath sounds: Normal breath sounds.   Abdominal:     General: Abdomen is flat.     Palpations: Abdomen is soft.  Musculoskeletal:        General: Normal range of motion.  Skin:    General: Skin is warm and dry.  Neurological:     General: No focal deficit present.     Mental Status: He is alert. Mental status is at baseline.  Psychiatric:        Attention and Perception: Attention normal.        Mood and Affect: Mood normal. Affect is blunt.        Speech: Speech is delayed.        Behavior: Behavior is cooperative.        Thought Content: Thought content normal.        Cognition and Memory: Cognition normal.        Judgment: Judgment normal.    Review of Systems  Constitutional: Negative.   HENT: Negative.    Eyes: Negative.   Respiratory: Negative.    Cardiovascular: Negative.   Gastrointestinal: Negative.   Musculoskeletal: Negative.   Skin: Negative.   Neurological: Negative.   Psychiatric/Behavioral: Negative.     Blood pressure 112/80, pulse 85, temperature 97.7 F (36.5 C), temperature source Oral, resp. rate 18, height 6' (1.829 m), weight 109.8 kg, SpO2 94 %. Body mass index is 32.82 kg/m.  Mental Status Per Nursing Assessment::   On Admission:  NA  Demographic Factors:  Male and Unemployed  Loss Factors: NA  Historical Factors: NA  Risk Reduction Factors:   Sense of responsibility to family, Living with another person, especially a relative, Positive social support, and Positive therapeutic relationship  Continued Clinical Symptoms:  Schizophrenia:   Less than 46 years old  Cognitive Features That Contribute To Risk:  None    Suicide Risk:  Minimal: No identifiable suicidal ideation.  Patients presenting with no risk factors but with morbid ruminations; may be classified as minimal risk based on the severity of the depressive symptoms   Follow-up Information     [address] [facility] & [place], [employer]. Follow up.   Why: The ACT team physician will follow up with you on  11/08/2022. Contact information: [address] [place] [address] [phone]                 Plan Of Care/Follow-up recommendations:  Other:  Patient doing much better.  Stable.  Denies hallucinations.  Able to hold a lucid conversation.  Denies any suicidal ideation.  Shows good insight.  Agrees to continue current medication and has appropriate follow-up in a safe place to live.  Mordecai Rasmussen, MD 11/08/2022, 9:28 AM

## 2022-11-08 NOTE — Progress Notes (Signed)
  [facility] Adult Case Management Discharge Plan :  Will you be returning to the same living situation after discharge:  Yes,  pt plans to return home upon discharge. At discharge, do you have transportation home?: Yes,  CSW will assist with transportation arrangements.  Do you have the ability to pay for your medications: Yes,  [employer].  Release of information consent forms completed and in the chart;  Patient's signature needed at discharge.  Patient to Follow up at:  Follow-up Information     [address] [facility] & [place], [employer]. Follow up.   Why: The ACT team physician will follow up with you on 11/08/2022. Contact information: [address] [place] [address] [place] [address] [phone]                 Next level of care provider has access to [facility] Link:no  Safety Planning and Suicide Prevention discussed: Yes,  SPE completed with mother, [name].     Has patient been referred to the Quitline?: Patient refused referral  Patient has been referred for addiction treatment: Pt. refused referral  Glenis Smoker, LCSW 11/08/2022, 10:12 AM

## 2022-11-08 NOTE — Plan of Care (Signed)
  Problem: Education: Goal: Knowledge of General Education information will improve Description: Including pain rating scale, medication(s)/side effects and non-pharmacologic comfort measures Outcome: Adequate for Discharge   Problem: Health Behavior/Discharge Planning: Goal: Ability to manage health-related needs will improve Outcome: Adequate for Discharge   Problem: Clinical Measurements: Goal: Ability to maintain clinical measurements within normal limits will improve Outcome: Adequate for Discharge Goal: Will remain free from infection Outcome: Adequate for Discharge Goal: Diagnostic test results will improve Outcome: Adequate for Discharge Goal: Respiratory complications will improve Outcome: Adequate for Discharge Goal: Cardiovascular complication will be avoided Outcome: Adequate for Discharge   Problem: Activity: Goal: Risk for activity intolerance will decrease Outcome: Adequate for Discharge   Problem: Nutrition: Goal: Adequate nutrition will be maintained Outcome: Adequate for Discharge   Problem: Coping: Goal: Level of anxiety will decrease Outcome: Adequate for Discharge   Problem: Elimination: Goal: Will not experience complications related to bowel motility Outcome: Adequate for Discharge Goal: Will not experience complications related to urinary retention Outcome: Adequate for Discharge   Problem: Pain Managment: Goal: General experience of comfort will improve Outcome: Adequate for Discharge   Problem: Safety: Goal: Ability to remain free from injury will improve Outcome: Adequate for Discharge   Problem: Skin Integrity: Goal: Risk for impaired skin integrity will decrease Outcome: Adequate for Discharge   Problem: Education: Goal: Knowledge of [place] [facility] Education information/materials will improve Outcome: Adequate for Discharge Goal: Emotional status will improve Outcome: Adequate for Discharge Goal: Mental status will  improve Outcome: Adequate for Discharge Goal: Verbalization of understanding the information provided will improve Outcome: Adequate for Discharge   Problem: Safety: Goal: Periods of time without injury will increase Outcome: Adequate for Discharge   Problem: Self-Concept: Goal: Ability to disclose and discuss suicidal ideas will improve Outcome: Adequate for Discharge Goal: Will verbalize positive feelings about self Outcome: Adequate for Discharge   Problem: Education: Goal: Will be free of psychotic symptoms Outcome: Adequate for Discharge Goal: Knowledge of the prescribed therapeutic regimen will improve Outcome: Adequate for Discharge   Problem: Coping: Goal: Coping ability will improve Outcome: Adequate for Discharge Goal: Will verbalize feelings Outcome: Adequate for Discharge

## 2022-11-08 NOTE — Progress Notes (Signed)
Patient ID: [name] [name], male   DOB: 08/10/1995, 27 y.o.   MRN: [id number]  Discharge Note:  Patient denies SI/HI/AVH at this time. Discharge instructions, AVS, prescriptions, and transition record gone over with patient. Patient given a copy of his Suicide Safety Plan. Patient agrees to comply with medication management, follow-up visit, and outpatient therapy. Patient belongings returned to patient. Patient questions and concerns addressed and answered. Patient ambulatory off unit. Patient discharged to home via [employer] services.

## 2022-11-08 NOTE — Discharge Summary (Signed)
Physician Discharge Summary Note  Patient:  [name] [name] is an 27 y.o., male MRN:  [id number] DOB:  Apr 06, 1996 Patient phone:  [phone] (home)  Patient address:   [address] [place] [address],  Total Time spent with patient: 30 minutes  Date of Admission:  11/04/2022 Date of Discharge: 11/08/2022  Reason for Admission: Brought to the emergency room after being found by law enforcement acting bizarrely in public running out into traffic asking people to kill him.  Patient was stating that he was having hallucinations telling him he should kill himself to go to heaven.  Principal Problem: Schizoaffective disorder, bipolar type Discharge Diagnoses: Principal Problem:   Schizoaffective disorder, bipolar type   Past Psychiatric History: Past history of schizophrenia or schizoaffective disorder.  Multiple prior hospitalizations.  Struggles in the past with medicine compliance.  Currently followed by the ACT team from [facility]  Past Medical History:  Past Medical History:  Diagnosis Date   Asthma    Depression    Psychosis    Schizoaffective disorder     Past Surgical History:  Procedure Laterality Date   BACK SURGERY     I&D groin  2017   Family History: History reviewed. No pertinent family history. Family Psychiatric  History: See previous Social History:  Social History   Substance and Sexual Activity  Alcohol Use No     Social History   Substance and Sexual Activity  Drug Use Yes   Types: Marijuana   Comment: reports cannabis use as funds permit    Social History   Socioeconomic History   Marital status: Single    Spouse name: Not on file   Number of children: Not on file   Years of education: Not on file   Highest education level: Not on file  Occupational History   Not on file  Tobacco Use   Smoking status: Every Day    Packs/day: .25    Types: Cigarettes   Smokeless tobacco: Never  Vaping Use   Vaping Use: Some days   Substance and Sexual Activity   Alcohol use: No   Drug use: Yes    Types: Marijuana    Comment: reports cannabis use as funds permit   Sexual activity: Not Currently    Partners: Female  Other Topics Concern   Not on file  Social History Narrative   Not on file   Social Determinants of Health   Financial Resource Strain: Not on file  Food Insecurity: No Food Insecurity (11/04/2022)   Hunger Vital Sign    Worried About Running Out of Food in the Last Year: Never true    Ran Out of Food in the Last Year: Never true  Transportation Needs: No Transportation Needs (11/04/2022)   PRAPARE - Administrator, Civil Service (Medical): No    Lack of Transportation (Non-Medical): No  Physical Activity: Not on file  Stress: Not on file  Social Connections: Not on file    Hospital Course: Patient admitted to the hospital.  Did not have any dangerous behavior in the hospital.  Denied suicidal thought.  Denied hallucinations.  Patient was given his long-acting injection of Invega and continued on antipsychotic medication.  He complained of some akathisia and has been given some Klonopin and propranolol to help with that.  At the time of discharge patient denies any suicidal thoughts and states he agrees to be compliant with medicine and treatment.  He has a safe place to stay  and will follow-up with his ACT team.  Physical Findings: AIMS:  , ,  ,  ,    CIWA:    COWS:     Musculoskeletal: Strength & Muscle Tone: within normal limits Gait & Station: normal Patient leans: N/A   Psychiatric Specialty Exam:  Presentation  General Appearance:  Bizarre  Eye Contact: Good  Speech: Slow  Speech Volume: Normal  Handedness: Right   Mood and Affect  Mood: Euthymic  Affect: Congruent   Thought Process  Thought Processes: Irrevelant  Descriptions of Associations:Circumstantial  Orientation:Full (Time, Place and Person)  Thought Content:Obsessions  History of  Schizophrenia/Schizoaffective disorder:Yes  Duration of Psychotic Symptoms:Greater than six months  Hallucinations:No data recorded Ideas of Reference:None  Suicidal Thoughts:No data recorded Homicidal Thoughts:No data recorded  Sensorium  Memory: Immediate Fair; Recent Fair; Remote Fair  Judgment: Poor  Insight: Poor   Executive Functions  Concentration: Poor  Attention Span: Poor  Recall: Poor  Fund of Knowledge: Poor  Language: Poor   Psychomotor Activity  Psychomotor Activity:No data recorded  Assets  Assets: Communication Skills; Financial Resources/Insurance; Resilience; Social Support   Sleep  Sleep:No data recorded   Physical Exam: Physical Exam Vitals reviewed.  Constitutional:      Appearance: Normal appearance.  HENT:     Head: Normocephalic and atraumatic.     Mouth/Throat:     Pharynx: Oropharynx is clear.  Eyes:     Pupils: Pupils are equal, round, and reactive to light.  Cardiovascular:     Rate and Rhythm: Normal rate and regular rhythm.  Pulmonary:     Effort: Pulmonary effort is normal.     Breath sounds: Normal breath sounds.  Abdominal:     General: Abdomen is flat.     Palpations: Abdomen is soft.  Musculoskeletal:        General: Normal range of motion.  Skin:    General: Skin is warm and dry.  Neurological:     General: No focal deficit present.     Mental Status: He is alert. Mental status is at baseline.  Psychiatric:        Attention and Perception: Attention normal.        Mood and Affect: Mood normal. Affect is blunt.        Speech: Speech is delayed.        Behavior: Behavior normal.        Thought Content: Thought content normal.        Cognition and Memory: Cognition normal.        Judgment: Judgment normal.    Review of Systems  Constitutional: Negative.   HENT: Negative.    Eyes: Negative.   Respiratory: Negative.    Cardiovascular: Negative.   Gastrointestinal: Negative.   Musculoskeletal:  Negative.   Skin: Negative.   Neurological: Negative.   Psychiatric/Behavioral:  Negative for depression, hallucinations and suicidal ideas.    Blood pressure 112/80, pulse 85, temperature 97.7 F (36.5 C), temperature source Oral, resp. rate 18, height 6' (1.829 m), weight 109.8 kg, SpO2 94 %. Body mass index is 32.82 kg/m.   Social History   Tobacco Use  Smoking Status Every Day   Packs/day: .25   Types: Cigarettes  Smokeless Tobacco Never   Tobacco Cessation:  A prescription for an FDA-approved tobacco cessation medication provided at discharge   Blood Alcohol level:  Lab Results  Component Value Date   [facility] <10 11/03/2022   ETH <10 05/21/2022    Metabolic Disorder Labs:  Lab Results  Component Value Date   HGBA1C 4.8 11/06/2022   MPG 91.06 11/06/2022   MPG 99.67 12/03/2021   No results found for: "PROLACTIN" Lab Results  Component Value Date   CHOL 181 11/06/2022   TRIG 117 11/06/2022   HDL 49 11/06/2022   CHOLHDL 3.7 11/06/2022   VLDL 23 11/06/2022   LDLCALC 109 (H) 11/06/2022   LDLCALC 129 (H) 12/06/2021    See Psychiatric Specialty Exam and Suicide Risk Assessment completed by Attending Physician prior to discharge.  Discharge destination:  Home  Is patient on multiple antipsychotic therapies at discharge:  Yes,   Do you recommend tapering to monotherapy for antipsychotics?  No   Has Patient had three or more failed trials of antipsychotic monotherapy by history:  Yes,   Antipsychotic medications that previously failed include:   1.  Clozapine., 2.  Hinda Glatter., and 3.  Haldol.  Recommended Plan for Multiple Antipsychotic Therapies: Additional reason(s) for multiple antispychotic treatment:  Patient is stable on current treatment.  I would not presume to tell the ACT team what to do in the future.  Discharge Instructions     Diet - low sodium heart healthy   Complete by: As directed    Increase activity slowly   Complete by: As directed        Allergies as of 11/08/2022   No Known Allergies      Medication List     STOP taking these medications    chlorproMAZINE 25 MG tablet Commonly known as: THORAZINE   cloZAPine 100 MG tablet Commonly known as: CLOZARIL   glycopyrrolate 2 MG tablet Commonly known as: ROBINUL   hydrOXYzine 50 MG tablet Commonly known as: ATARAX   loxapine 10 MG capsule Commonly known as: LOXITANE       TAKE these medications      Indication  amLODipine 10 MG tablet Commonly known as: NORVASC Take 1 tablet (10 mg total) by mouth daily.  Indication: High Blood Pressure Disorder   clonazePAM 0.5 MG tablet Commonly known as: KLONOPIN Take 1 tablet (0.5 mg total) by mouth 2 (two) times daily.  Indication: Akathisia   divalproex 500 MG 24 hr tablet Commonly known as: DEPAKOTE ER Take 1 tablet (500 mg total) by mouth 2 (two) times daily.  Indication: Manic Phase of Manic-Depression   haloperidol 5 MG tablet Commonly known as: HALDOL Take 1 tablet (5 mg total) by mouth at bedtime.  Indication: Psychosis   nicotine 21 mg/24hr patch Commonly known as: NICODERM CQ - dosed in mg/24 hours Place 1 patch (21 mg total) onto the skin daily.  Indication: Nicotine Addiction   nicotine polacrilex 2 MG gum Commonly known as: NICORETTE Take 1 each (2 mg total) by mouth as needed for smoking cessation.  Indication: Nicotine Addiction   paliperidone 234 MG/1.5ML injection Commonly known as: INVEGA SUSTENNA Inject 234 mg into the muscle every 28 (twenty-eight) days. Start taking on: Nov 27, 2022  Indication: Schizoaffective Disorder, 11/27/2022   paliperidone 3 MG 24 hr tablet Commonly known as: INVEGA Take 2 tablets (6 mg total) by mouth daily.  Indication: Schizophrenia   propranolol 10 MG tablet Commonly known as: INDERAL Take 1 tablet (10 mg total) by mouth 3 (three) times daily.  Indication: Neuroleptic-Induced Akathisia   traZODone 100 MG tablet Commonly known as:  DESYREL Take 1 tablet (100 mg total) by mouth at bedtime.  Indication: Trouble Sleeping        Follow-up Information     [place]  [facility] [employer] [place] [employer]. Follow up.   Why: The ACT team physician will follow up with you on 11/08/2022. Contact information: [address] [place] [address] [phone]                 Follow-up recommendations:  Other:  Follow-up with [name] ACT team continue current medicine.  Avoid all intoxicating drugs.  Comments: Prescriptions provided  Signed: Mordecai Rasmussen, MD 11/08/2022, 9:36 AM

## 2022-11-08 NOTE — Group Note (Signed)
Recreation Therapy Group Note   Group Topic:Leisure Education  Group Date: 11/08/2022 Start Time: 1000 End Time: 1115 Facilitators: Rosina Lowenstein LRT, CTRS Location:  Craft Room  Group Description: Leisure. Patients were given the option to choose from singing karaoke, make origami, playing with a deck of cards, or coloring mandalas. LRT and pts discussed the importance of participating in leisure during their free time and when they're outside of the hospital. Pt identified two leisure interests and shared with the group.   Goal Area(s) Addressed:  Patient will identify a current leisure interest.  Patient will practice making a positive decision. Patient will have the opportunity to try a new leisure activity.  Affect/Mood: Appropriate   Participation Level: Active and Engaged   Participation Quality: Independent   Behavior: Appropriate, Calm, Cooperative, and Eager   Speech/Thought Process: Coherent   Insight: Good   Judgement: Good   Modes of Intervention: Activity   Patient Response to Interventions:  Attentive, Engaged, Interested , and Receptive   Education Outcome:  Acknowledges education   Clinical Observations/Individualized Feedback: [name] [name] was active in their participation of session activities and group discussion. Pt identified "sing music or go for a ride in the car." Pt chose to sing multiple karaoke songs while in group, both alone and with a peer in a duet. Pt interacted well with LRT and peers while in group.   Plan: Continue to engage patient in RT group sessions 2-3x/week.   Rosina Lowenstein, LRT, CTRS 11/08/2022 11:30 AM

## 2023-02-14 ENCOUNTER — Other Ambulatory Visit: Payer: Self-pay

## 2023-02-14 ENCOUNTER — Emergency Department
Admission: EM | Admit: 2023-02-14 | Discharge: 2023-02-14 | Disposition: A | Discharge status: Home / Self Care | Payer: MEDICAID | Attending: Emergency Medicine | Admitting: Emergency Medicine

## 2023-02-14 DIAGNOSIS — F17210 Nicotine dependence, cigarettes, uncomplicated: Secondary | ICD-10-CM | POA: Insufficient documentation

## 2023-02-14 DIAGNOSIS — J45909 Unspecified asthma, uncomplicated: Secondary | ICD-10-CM | POA: Diagnosis not present

## 2023-02-14 DIAGNOSIS — F22 Delusional disorders: Secondary | ICD-10-CM | POA: Insufficient documentation

## 2023-02-14 DIAGNOSIS — F250 Schizoaffective disorder, bipolar type: Secondary | ICD-10-CM | POA: Insufficient documentation

## 2023-02-14 LAB — COMPREHENSIVE METABOLIC PANEL
ALT: 30 U/L (ref 0–44)
AST: 22 U/L (ref 15–41)
Albumin: 4.4 g/dL (ref 3.5–5.0)
Alkaline Phosphatase: 88 U/L (ref 38–126)
Anion gap: 10 (ref 5–15)
BUN: 14 mg/dL (ref 6–20)
CO2: 24 mmol/L (ref 22–32)
Calcium: 9.4 mg/dL (ref 8.9–10.3)
Chloride: 105 mmol/L (ref 98–111)
Creatinine, Ser: 1 mg/dL (ref 0.61–1.24)
GFR, Estimated: >60 mL/min (ref >60)
Glucose, Bld: 137 mg/dL — ABNORMAL HIGH (ref 70–99)
Potassium: 3.3 mmol/L — ABNORMAL LOW (ref 3.5–5.1)
Sodium: 139 mmol/L (ref 135–145)
Total Bilirubin: 0.7 mg/dL (ref 0.3–1.2)
Total Protein: 8.4 g/dL — ABNORMAL HIGH (ref 6.5–8.1)

## 2023-02-14 LAB — CBC
HCT: 44 % (ref 39.0–52.0)
Hemoglobin: 16 g/dL (ref 13.0–17.0)
MCH: 28.7 pg (ref 26.0–34.0)
MCHC: 36.4 g/dL — ABNORMAL HIGH (ref 30.0–36.0)
MCV: 79 fL — ABNORMAL LOW (ref 80.0–100.0)
Platelets: 281 10*3/uL (ref 150–400)
RBC: 5.57 MIL/uL (ref 4.22–5.81)
RDW: 12.4 % (ref 11.5–15.5)
WBC: 7.8 10*3/uL (ref 4.0–10.5)
nRBC: 0 % (ref 0.0–0.2)

## 2023-02-14 LAB — URINE DRUG SCREEN, QUALITATIVE (ARMC ONLY)
Amphetamines, Ur Screen: NOT DETECTED
Barbiturates, Ur Screen: NOT DETECTED
Benzodiazepine, Ur Scrn: NOT DETECTED
Cannabinoid 50 Ng, Ur ~~LOC~~: POSITIVE — AB
Cocaine Metabolite,Ur ~~LOC~~: NOT DETECTED
MDMA (Ecstasy)Ur Screen: NOT DETECTED
Methadone Scn, Ur: NOT DETECTED
Opiate, Ur Screen: NOT DETECTED
Phencyclidine (PCP) Ur S: NOT DETECTED
Tricyclic, Ur Screen: POSITIVE — AB

## 2023-02-14 LAB — ETHANOL: Alcohol, Ethyl (B): <10 mg/dL (ref <10)

## 2023-02-14 LAB — SALICYLATE LEVEL: Salicylate Lvl: <7 mg/dL — ABNORMAL LOW (ref 7.0–30.0)

## 2023-02-14 LAB — ACETAMINOPHEN LEVEL: Acetaminophen (Tylenol), Serum: <10 ug/mL — ABNORMAL LOW (ref 10–30)

## 2023-02-14 MED ORDER — AMLODIPINE BESYLATE 5 MG PO TABS
10.0000 mg | ORAL_TABLET | Freq: Every day | ORAL | Status: DC
  Administered 2023-02-14: 10 mg via ORAL
  Filled 2023-02-14: qty 2

## 2023-02-14 MED ORDER — GLYCOPYRROLATE 1 MG PO TABS
1.5000 mg | ORAL_TABLET | Freq: Two times a day (BID) | ORAL | Status: DC
  Administered 2023-02-14: 1.5 mg via ORAL
  Filled 2023-02-14: qty 2

## 2023-02-14 MED ORDER — DIVALPROEX SODIUM ER 250 MG PO TB24
500.0000 mg | ORAL_TABLET | Freq: Two times a day (BID) | ORAL | Status: DC
  Administered 2023-02-14: 500 mg via ORAL
  Filled 2023-02-14: qty 2

## 2023-02-14 MED ORDER — PROPRANOLOL HCL 20 MG PO TABS
10.0000 mg | ORAL_TABLET | Freq: Three times a day (TID) | ORAL | Status: DC
  Administered 2023-02-14: 10 mg via ORAL
  Filled 2023-02-14: qty 1

## 2023-02-14 MED ORDER — HALOPERIDOL 5 MG PO TABS: 5.0000 mg | ORAL_TABLET | Freq: Every day | ORAL | Status: DC

## 2023-02-14 MED ORDER — OLANZAPINE 5 MG PO TBDP: 10.0000 mg | ORAL_TABLET | Freq: Three times a day (TID) | ORAL | Status: DC | PRN

## 2023-02-14 MED ORDER — CLONAZEPAM 0.5 MG PO TABS
0.5000 mg | ORAL_TABLET | Freq: Two times a day (BID) | ORAL | Status: DC
  Administered 2023-02-14: 0.5 mg via ORAL
  Filled 2023-02-14: qty 1

## 2023-02-14 MED ORDER — TRAZODONE HCL 100 MG PO TABS: 100.0000 mg | ORAL_TABLET | Freq: Every day | ORAL | Status: DC

## 2023-02-14 MED ORDER — NICOTINE 21 MG/24HR TD PT24
21.0000 mg | MEDICATED_PATCH | Freq: Every day | TRANSDERMAL | Status: DC
  Administered 2023-02-14: 21 mg via TRANSDERMAL
  Filled 2023-02-14: qty 1

## 2023-02-14 MED ORDER — ONDANSETRON HCL 4 MG PO TABS: 4.0000 mg | ORAL_TABLET | Freq: Three times a day (TID) | ORAL | Status: DC | PRN

## 2023-02-14 MED ORDER — PALIPERIDONE ER 3 MG PO TB24
6.0000 mg | ORAL_TABLET | Freq: Every day | ORAL | Status: DC
  Administered 2023-02-14: 6 mg via ORAL
  Filled 2023-02-14: qty 2

## 2023-02-14 MED ORDER — CHLORPROMAZINE HCL 25 MG PO TABS
25.0000 mg | ORAL_TABLET | Freq: Three times a day (TID) | ORAL | Status: DC
  Administered 2023-02-14: 25 mg via ORAL
  Filled 2023-02-14 (×2): qty 1

## 2023-02-14 MED ORDER — ZIPRASIDONE MESYLATE 20 MG IM SOLR: 20.0000 mg | INTRAMUSCULAR | Status: DC | PRN

## 2023-02-14 MED ORDER — CLOZAPINE 100 MG PO TABS
150.0000 mg | ORAL_TABLET | Freq: Two times a day (BID) | ORAL | Status: DC
  Administered 2023-02-14: 150 mg via ORAL
  Filled 2023-02-14: qty 2

## 2023-02-14 MED ORDER — ALUM & MAG HYDROXIDE-SIMETH 200-200-20 MG/5ML PO SUSP: 30.0000 mL | Freq: Four times a day (QID) | ORAL | Status: DC | PRN

## 2023-02-14 MED ORDER — ACETAMINOPHEN 325 MG PO TABS: 650.0000 mg | ORAL_TABLET | ORAL | Status: DC | PRN

## 2023-02-14 NOTE — BH Assessment (Signed)
Writer contacted pt's [name] (Mother) [phone] for collateral however there was no answer. A HIPAA compliant voicemail was left requesting a call back.

## 2023-02-14 NOTE — ED Notes (Signed)
TTS has completed assessments

## 2023-02-14 NOTE — ED Notes (Signed)
Attempted to call emergency contact and that number is no longer available.

## 2023-02-14 NOTE — ED Notes (Signed)
Patient is still waiting for transport.

## 2023-02-14 NOTE — ED Notes (Addendum)
Patient continues to have smell present of marijuana when entering his room but refuses search of person. No marijuana noted when shirt lifted for EKG.

## 2023-02-14 NOTE — ED Provider Notes (Signed)
[facility] Provider Note    Event Date/Time   First MD Initiated Contact with Patient 02/14/23 [phone]     (approximate)   History   Psychiatric Evaluation   HPI  [name] [name] is a 27 y.o. male  here with feeling like he needs to speak to a psychiatrist. Pt says that he has had "a lot of thoughts" all day and felt "traumatized." He says this typically happens before he acts out on impulse and is hospitalized, but he wanted to "catch it" earlier. Denies any other complaints. No overt SI, HI. He has been taking his meds as prescribed. He is voluntary at this time.       Physical Exam   Triage Vital Signs: ED Triage Vitals  Encounter Vitals Group     BP 02/14/23 0413 (!) 147/97     Systolic BP Percentile --      Diastolic BP Percentile --      Pulse Rate 02/14/23 0413 (!) 127     Resp 02/14/23 0413 18     Temp 02/14/23 0413 98.3 F (36.8 C)     Temp src --      SpO2 02/14/23 0413 99 %     Weight 02/14/23 0414 220 lb (99.8 kg)     Height 02/14/23 0414 6\' 1"  (1.854 m)     Head Circumference --      Peak Flow --      Pain Score 02/14/23 0413 0     Pain Loc --      Pain Education --      Exclude from Growth Chart --     Most recent vital signs: Vitals:   02/14/23 0413  BP: (!) 147/97  Pulse: (!) 127  Resp: 18  Temp: 98.3 F (36.8 C)  SpO2: 99%     General: Awake, no distress.  CV:  Good peripheral perfusion.  Resp:  Normal work of breathing.  Abd:  No distention.  Other:  Calm, cooperative.   ED Results / Procedures / Treatments   Labs (all labs ordered are listed, but only abnormal results are displayed) Labs Reviewed  COMPREHENSIVE METABOLIC PANEL - Abnormal; Notable for the following components:      Result Value   Potassium 3.3 (*)    Glucose, Bld 137 (*)    Total Protein 8.4 (*)    All other components within normal limits  SALICYLATE LEVEL - Abnormal; Notable for the following components:   Salicylate Lvl <7.0 (*)     All other components within normal limits  ACETAMINOPHEN LEVEL - Abnormal; Notable for the following components:   Acetaminophen (Tylenol), Serum <10 (*)    All other components within normal limits  CBC - Abnormal; Notable for the following components:   MCV 79.0 (*)    MCHC 36.4 (*)    All other components within normal limits  ETHANOL  URINE DRUG SCREEN, QUALITATIVE ([facility] ONLY)     EKG    RADIOLOGY    I also independently reviewed and agree with radiologist interpretations.   PROCEDURES:  Critical Care performed: No   MEDICATIONS ORDERED IN ED: Medications  acetaminophen (TYLENOL) tablet 650 mg (has no administration in time range)  ondansetron (ZOFRAN) tablet 4 mg (has no administration in time range)  alum & mag hydroxide-simeth (MAALOX/MYLANTA) 200-200-20 MG/5ML suspension 30 mL (has no administration in time range)  amLODipine (NORVASC) tablet 10 mg (has no administration in time range)  clonazePAM (KLONOPIN) tablet 0.5 mg (  has no administration in time range)  divalproex (DEPAKOTE ER) 24 hr tablet 500 mg (has no administration in time range)  haloperidol (HALDOL) tablet 5 mg (has no administration in time range)  nicotine (NICODERM CQ - dosed in mg/24 hours) patch 21 mg (has no administration in time range)  paliperidone (INVEGA) 24 hr tablet 6 mg (has no administration in time range)  propranolol (INDERAL) tablet 10 mg (has no administration in time range)  traZODone (DESYREL) tablet 100 mg (has no administration in time range)     IMPRESSION / MDM / ASSESSMENT AND PLAN / ED COURSE  I reviewed the triage vital signs and the nursing notes.                              Differential diagnosis includes, but is not limited to, anxiety, depression, paranoia w/ psychosis, situational stressors, adjustment d/o  Patient's presentation is most consistent with acute presentation with potential threat to life or bodily function.  The patient is on the cardiac  monitor to evaluate for evidence of arrhythmia and/or significant heart rate changes   27 yo M here with worsening paranoia, feeling of being "traumatized." H/o similar presentation prior to psych hospitalizations in the past. He is voluntary at this time. Will c/s Psych given his h/o requiring hospitalizations in this setting, though he denies overt SI, Hi at this time so would not meet IVC criteria. He remains voluntary however.    FINAL CLINICAL IMPRESSION(S) / ED DIAGNOSES   Final diagnoses:  Paranoia (HCC)     Rx / DC Orders   ED Discharge Orders     None        Note:  This document was prepared using Dragon voice recognition software and may include unintentional dictation errors.   Shaune Pollack, MD 02/14/23 [phone]

## 2023-02-14 NOTE — Consult Note (Signed)
Telepsych Consultation   Reason for Consult:  Consult for medication management Referring Physician:  Dr. Shaune Pollack Location of Patient: [facility] Location of Provider: Other: Remote  Patient Identification: [name] [name] MRN:  [id number] Principal Diagnosis: Schizoaffective disorder, bipolar type (HCC) Diagnosis:  Principal Problem:   Schizoaffective disorder, bipolar type (HCC)  Total Time spent with patient: 45 minutes  Subjective:   [name] [name] is a 27 y.o. male patient admitted with complaints of having "a lot of thoughts" all day and feeling "traumatized".  HPI:   27 y/o male with history of schizophrenia vs schizoaffective disorder presenting to [facility] on 02/14/23 with concerns of having "a lot of thoughts" all day and feeling "traumatized". Reports he felt this way because he could not fall asleep. States since being at the ED, he has been able to get sleep and is now feeling better. Endorses euthymic mood. Denies suicidal, homicidal ideations. Denies auditory visual hallucinations or paranoia. States he lives in [place] with his mother and brother. He is followed by ACT team. Denies recent substance use although notes suggest patient may have been smoking marijuana in the bathroom. UDS from 02/14/23 is +cannabinoid and +tricyclic. He states ACT visited yesterday and visit went well. He denies history of non suicidal self injurious behavior, suicide attempt. Endorses history of inpatient psychiatric hospitalization. He says mother will be the one to pick him up when discharged. He gives verbal consent to speak with his mother and ACT team.  Attempted to call patient's mother at [phone], as per chart, without success.  Able to contact pt's ACT team at [phone]. Per peer support specialist, Morrie Sheldon, saw patient yesterday. Pt was at baseline. No safety concerns from yesterday's presentation. She notes patient does use marijuana and alcohol. Last week pt admitted  to using marijuana and was irritable, agitated. Medications reconciled with ACT - home medications include: -Chlorpromazine 25mg  TID -Clozaril 150mg  BID -Depakote ER 500mg  BID -Gean Birchwood - last received this past Tuesday -Glycopyrrolate 1.5mg  BID ACT and this writer have attempted to call patient's mother several times without success. ACT states patient's mother's phone number in chart is incorrect. Correct number for her is [phone]. They state that they will do a welfare check on patient's home today as it is uncommon for her not to answer her phone. They state if pt is discharged, they will follow up with pt on Monday.  Update: Able to get in contact with pt's mother at [phone]. She states she was at work and unable to pick up phone. She feels comfortable with pt coming back home today. She can pick him up. She is aware of involuntary commitment process or need to call 911/EMS if needed. She denies there is a firearm or other weapons in the home. ACT team will see him on Monday. Spoke with ACT team at [phone], discussed pt will be discharged and they confirmed they will see him on Monday.  Past Psychiatric History: Schizophrenia vs schizoaffective disorder  Risk to Self: No Risk to Others: No Prior Inpatient Therapy: Yes Prior Outpatient Therapy: Yes. Patient is currently followed by the [facility] ACT team.  Past Medical History:  Past Medical History:  Diagnosis Date   Asthma    Depression    Psychosis (HCC)    Schizoaffective disorder (HCC)     Past Surgical History:  Procedure Laterality Date   BACK SURGERY     I&D groin  2017   Family History: History reviewed. No pertinent family history. Family Psychiatric  History: None reported Social History:  Social History   Substance and Sexual Activity  Alcohol Use No     Social History   Substance and Sexual Activity  Drug Use Yes   Types: Marijuana   Comment: reports cannabis use as funds permit     Social History   Socioeconomic History   Marital status: Single    Spouse name: Not on file   Number of children: Not on file   Years of education: Not on file   Highest education level: Not on file  Occupational History   Not on file  Tobacco Use   Smoking status: Every Day    Current packs/day: 0.25    Types: Cigarettes   Smokeless tobacco: Never  Vaping Use   Vaping status: Some Days  Substance and Sexual Activity   Alcohol use: No   Drug use: Yes    Types: Marijuana    Comment: reports cannabis use as funds permit   Sexual activity: Not Currently    Partners: Female  Other Topics Concern   Not on file  Social History Narrative   Not on file   Social Determinants of Health   Financial Resource Strain: Not on file  Food Insecurity: No Food Insecurity (11/04/2022)   Hunger Vital Sign    Worried About Running Out of Food in the Last Year: Never true    Ran Out of Food in the Last Year: Never true  Transportation Needs: No Transportation Needs (11/04/2022)   PRAPARE - Administrator, Civil Service (Medical): No    Lack of Transportation (Non-Medical): No  Physical Activity: Not on file  Stress: Not on file  Social Connections: Not on file   Additional Social History:    Allergies:  No Known Allergies  Labs:  No results found for this or any previous visit (from the past 48 hour(s)).   Medications:  No current facility-administered medications for this encounter.   Current Outpatient Medications  Medication Sig Dispense Refill   amLODipine (NORVASC) 10 MG tablet Take 1 tablet (10 mg total) by mouth daily. 30 tablet 1   chlorproMAZINE (THORAZINE) 25 MG tablet Take 25 mg by mouth 3 (three) times daily.     clonazePAM (KLONOPIN) 0.5 MG tablet Take 1 tablet (0.5 mg total) by mouth 2 (two) times daily. 60 tablet 0   cloZAPine (CLOZARIL) 100 MG tablet Take 300 mg by mouth at bedtime.     divalproex (DEPAKOTE ER) 500 MG 24 hr tablet Take 1 tablet (500  mg total) by mouth 2 (two) times daily. 60 tablet 1   glycopyrrolate (ROBINUL) 1 MG tablet Take 1 mg by mouth 2 (two) times daily.     glycopyrrolate (ROBINUL) 2 MG tablet Take 2 mg by mouth at bedtime.     haloperidol (HALDOL) 5 MG tablet Take 1 tablet (5 mg total) by mouth at bedtime. 30 tablet 1   Multiple Vitamin (MULTIVITAMIN ADULT PO) Take 1 tablet by mouth daily.     nicotine (NICODERM CQ - DOSED IN MG/24 HOURS) 21 mg/24hr patch Place 1 patch (21 mg total) onto the skin daily. 28 patch 1   nicotine polacrilex (NICORETTE) 2 MG gum Take 1 each (2 mg total) by mouth as needed for smoking cessation. 100 tablet 1   paliperidone (INVEGA SUSTENNA) 234 MG/1.5ML injection Inject 234 mg into the muscle every 28 (twenty-eight) days. 1.8 mL 1   propranolol (INDERAL) 10 MG tablet Take 1 tablet (10 mg total) by  mouth 3 (three) times daily. 90 tablet 1   traZODone (DESYREL) 100 MG tablet Take 1 tablet (100 mg total) by mouth at bedtime. 30 tablet 1   Glycopyrrolate 1.5 MG TABS Take 1 tablet by mouth 2 (two) times daily. (Patient not taking: Reported on 02/14/2023)     paliperidone (INVEGA) 3 MG 24 hr tablet Take 2 tablets (6 mg total) by mouth daily. (Patient not taking: Reported on 02/14/2023) 60 tablet 1   Musculoskeletal: Strength & Muscle Tone:  remote visit Gait & Station: normal Patient leans: N/A  Psychiatric Specialty Exam:  Presentation  General Appearance:  Appropriate for Environment  Eye Contact: Fair  Speech: Clear and Coherent; Slow  Speech Volume: Normal  Handedness: Right   Mood and Affect  Mood: Euthymic  Affect: Flat   Thought Process  Thought Processes: Coherent; Goal Directed; Linear  Descriptions of Associations:Intact  Orientation:Full (Time, Place and Person)  Thought Content:Logical  History of Schizophrenia/Schizoaffective disorder:Yes  Duration of Psychotic Symptoms:Greater than six months  Hallucinations:No data recorded  Ideas of  Reference:None  Suicidal Thoughts:No data recorded  Homicidal Thoughts:No data recorded   Sensorium  Memory: Immediate Fair  Judgment: Intact  Insight: Shallow   Executive Functions  Concentration: Fair  Attention Span: Fair  Recall: Fair  Fund of Knowledge: Fair  Language: Fair   Psychomotor Activity  Psychomotor Activity:No data recorded   Assets  Assets: Communication Skills; Desire for Improvement; Financial Resources/Insurance; Housing; Resilience; Social Support   Sleep  Sleep:No data recorded    Physical Exam: Physical Exam Constitutional:      General: He is not in acute distress.    Appearance: He is not ill-appearing, toxic-appearing or diaphoretic.  Eyes:     General: No scleral icterus. Cardiovascular:     Rate and Rhythm: Normal rate.  Pulmonary:     Effort: Pulmonary effort is normal. No respiratory distress.  Neurological:     Mental Status: He is alert and oriented to person, place, and time.  Psychiatric:        Attention and Perception: Attention and perception normal.        Mood and Affect: Mood normal. Affect is flat.        Speech: Speech normal.        Behavior: Behavior normal.        Thought Content: Thought content does not include homicidal or suicidal ideation.        Cognition and Memory: Cognition and memory normal.    Review of Systems  Constitutional:  Negative for chills and fever.  Respiratory:  Negative for shortness of breath.   Cardiovascular:  Negative for chest pain and palpitations.  Gastrointestinal:  Negative for abdominal pain.  Neurological:  Negative for headaches.  Psychiatric/Behavioral:  Negative for hallucinations and suicidal ideas.    Blood pressure (P) 113/81, pulse (P) 92, temperature (P) 97.9 F (36.6 C), temperature source (P) Oral, resp. rate (P) 18, height 6\' 1"  (1.854 m), weight 99.8 kg, SpO2 100%. Body mass index is 29.03 kg/m.  Treatment Plan Summary: Medication management  and Plan   -Restart patient's home medications -Chlorpromazine 25mg  TID -Clozaril 150mg  BID (per note from pharmacist, last ANC >1.5 01/16/23, next ANC due on 02/21/23) -Depakote ER 500mg  BID -Hinda Glatter Sustenna - last received this past Tuesday -Glycopyrrolate 1.5mg  BID  -PRN agitation medications ordered -zyprexa 10mg  oral every 8 hours prn agitation -geodon 20mg  IM prn agitation for 1 dose  -Ordered EKG - pt on Qtc prolonging medications --> Qtc =  418 -Ordered depakote level - pt on depakote  Disposition:    Case discussed and staffed with attending psychiatrist, Dr. Marval Regal. Patient does not meet IVC criteria at this time. Attempting to reach patient's mother for further collateral and to confirm safe discharge plan. If patient chooses to leave before then, he will be leaving AMA.  Update: Pt is now psychiatrically cleared for discharged. Pt's mother feels comfortable with pt coming home today. ACT team to see pt on Monday.   This service was provided via telemedicine using a 2-way, interactive audio and video technology.  Names of all persons participating in this telemedicine service and their role in this encounter. Name: [name] Role: Patient  Name: Darrick Grinder Role: Nurse Practitioner  Name: Dr. Roxan Hockey Role: ED Physician  Name: Alroy Dust Role: ED Nurse  Name: Dr. Marval Regal Role: Psychiatrist   Lauree Chandler, NP 02/17/2023 4:07 PM

## 2023-02-14 NOTE — ED Notes (Signed)
Patient asked about potentially having marijuana in his possession. Patient denies having it. States he does smoke, but hasn't today. Patient unwilling to be searched and is not IVC. Room searched with no marijuana found.

## 2023-02-14 NOTE — ED Provider Notes (Signed)
The patient has been evaluated at bedside by psychiatry.  Patient is clinically stable.  Not felt to be a danger to self or others.  No SI or Hi.  No indication for inpatient psychiatric admission at this time.  Appropriate for continued outpatient therapy.    Willy Eddy, MD 02/14/23 [phone]

## 2023-02-14 NOTE — Progress Notes (Signed)
PHARMACIST - PHYSICIAN ORDER COMMUNICATION  [name] [name] is a 27 y.o. year old male with a history of schizoaffective disorder on Clozapine PTA. Continuing this medication order as an inpatient requires that monitoring parameters per REMS requirements must be met.  Clozapine REMS Dispense Authorization was obtained, and will dispense inpatient.  RDA code [id number].  Verified Clozapine dose: 300 po at bedtime. Last ANC value and date reported on the Clozapine REMS website: 01/16/23 >1.5 ANC monitoring frequency: weekly Next ANC reporting is due on (date) 02/21/23. [name] [facility] 02/14/2023, 10:31 AM

## 2023-02-14 NOTE — ED Notes (Signed)
Pt reports he is here due to "trauma" that he is going through, later calls it emotional and mental trauma. Pt has no further description of this trauma but states that when he feels this way he believes he is better off at the hospital so he came here tonight. Pt is calm and cooperative at this time. Denies SI, HI, AVH; reports compliance with home meds. Pt provide with blanket, tv on, food and drink. Pt was unable to provide urine sample in triage and is aware that sample is needed, states he will let staff know when he can use restroom.

## 2023-02-14 NOTE — ED Triage Notes (Signed)
Pt to ED voluntarily with mom for psych eval. Pt says he is really paranoid and anxious but wont say about what, just says he "feels traumatized". Denies hallucinations, SI, HI. Pt compliant with medication.

## 2023-02-14 NOTE — ED Notes (Signed)
Patient waiting on mother to arrive for pickup.

## 2023-02-14 NOTE — ED Notes (Signed)
Patient is currently showering at this time.

## 2023-02-14 NOTE — BH Assessment (Addendum)
Comprehensive Clinical Assessment (CCA) Screening, Triage and Referral Note  02/14/2023 [name] [name] [id number] Recommendations for Services/Supports/Treatments: Psych consult/disposition pending.  [name] [name] is a 27 y.o., Black race, Not Hispanic or Latino ethnicity, ENGLISH speaking male who presented ED voluntarily for an evaluation. Per triage note:  Pt to ED voluntarily with mom for psych eval. Pt says he is really paranoid and anxious but won't say about what, just says he "feels traumatized".     Since arrival to the ED, the patient has been calm, cooperative, and polite. The patient has not had any behavioral or aggressive episodes while in the psych ED. Pt has not required any emergency interventions. Upon assessment pt. was drowsy, but actively participated. When asked what'd brought him to the ED, the pt. stated, "feeling traumatized". Pt could not expound what feeling traumatized meant, and could not identify a specific trigger. Pt denied having any recent life events. Pt was not responding to internal stimuli. Pt had clear and coherent speech and thoughts were linear. Pt was oriented x4. Pt presented with relevant thought processes and normal psychomotor activity. Pt presented with a euthymic mood; affect was congruent.  The patient denied symptoms of depression, sleep/appetite disturbance/paranoia, or current SI, HI or AV/H. Pt's BAL is unremarkable; UDS is pending.   Chief Complaint:  Chief Complaint  Patient presents with   Psychiatric Evaluation   Visit Diagnosis: Schizoaffective disorder, bipolar type   Patient Reported Information How did you hear about [place]? Legal System  What Is the Reason for Your Visit/Call Today? Pt to ED voluntarily with mom for psych eval. Pt says he is really paranoid and anxious but wont say about what, just says he "feels traumatized". Denies hallucinations, SI, HI. Pt compliant with medication.   How Long Has This Been Causing You  Problems? > than 6 months  What Do You Feel Would Help You the Most Today? Treatment for Depression or other mood problem; Medication(s)   Have You Recently Had Any Thoughts About Hurting Yourself? No  Are You Planning to Commit Suicide/Harm Yourself At This time? No   Have you Recently Had Thoughts About Hurting Someone [name] [name]? No  Are You Planning to Harm Someone at This Time? No  Explanation: No data recorded  Have You Used Any Alcohol or Drugs in the Past 24 Hours? No  How Long Ago Did You Use Drugs or Alcohol? No data recorded What Did You Use and How Much? No data recorded  Do You Currently Have a Therapist/Psychiatrist? -- (Unknown)  Name of Therapist/Psychiatrist: Patient supossedly has a ACT team   Have You Been Recently Discharged From Any Office Practice or Programs? No  Explanation of Discharge From Practice/Program: No data recorded   CCA Screening Triage Referral Assessment Type of Contact: Face-to-Face  Telemedicine Service Delivery:   Is this Initial or Reassessment?   Date Telepsych consult ordered in [facility]:    Time Telepsych consult ordered in [facility]:    Location of Assessment: [facility] ED  Provider Location: [facility] ED    Collateral Involvement: [name] ACT team Nurse   Does Patient Have a Automotive engineer Guardian? No data recorded Name and Contact of Legal Guardian: No data recorded If Minor and Not Living with Parent(s), Who has Custody? No data recorded Is CPS involved or ever been involved? Never  Is APS involved or ever been involved? Never   Patient Determined To Be At Risk for Harm To Self or Others Based on Review of Patient Reported  Information or Presenting Complaint? No  Method: No data recorded Availability of Means: No data recorded Intent: No data recorded Notification Required: No data recorded Additional Information for Danger to Others Potential: No data recorded Additional Comments for Danger to Others Potential: No data  recorded Are There Guns or Other Weapons in Your Home? No  Types of Guns/Weapons: No data recorded Are These Weapons Safely Secured?                            No data recorded Who Could Verify You Are Able To Have These Secured: No data recorded Do You Have any Outstanding Charges, Pending Court Dates, Parole/Probation? No data recorded Contacted To Inform of Risk of Harm To Self or Others: No data recorded  Does Patient Present under Involuntary Commitment? Yes    [place] of Residence: [place]   Patient Currently Receiving the Following Services: ACTT Psychologist, educational)   Determination of Need: Emergent (2 hours)   Options For Referral: ED Visit; Inpatient Hospitalization; Medication Management   Discharge Disposition:     Chrsitopher Wik R Torin Modica, LCAS

## 2023-02-14 NOTE — ED Notes (Signed)
Security officer notice Marijuana scent while patient was coming out the bathroom. Security officer told EDT Grainger Mccarley. EDT told RN Victorino Dike that it was ashes on the bathroom toilet and that patient was smoking in the bathroom. Patient denies smoking in the bathroom to the security officer Sheria Lang. Patient was told to dress out again and patient refused that he is voluntary and he is ready to go home. Patient did give a urine sample before taking a shower. Patient did not take a shower, nothing was wet but the blankets. Patient is currently laying down at this time.

## 2023-03-26 ENCOUNTER — Encounter: Payer: Self-pay | Admitting: Emergency Medicine

## 2023-03-26 ENCOUNTER — Emergency Department
Admission: EM | Admit: 2023-03-26 | Discharge: 2023-03-27 | Disposition: A | Discharge status: Other Acute Inpatient Hospital | Payer: MEDICAID | Attending: Emergency Medicine | Admitting: Emergency Medicine

## 2023-03-26 DIAGNOSIS — Z91148 Patient's other noncompliance with medication regimen for other reason: Secondary | ICD-10-CM

## 2023-03-26 DIAGNOSIS — F258 Other schizoaffective disorders: Secondary | ICD-10-CM

## 2023-03-26 DIAGNOSIS — F203 Undifferentiated schizophrenia: Secondary | ICD-10-CM | POA: Diagnosis present

## 2023-03-26 DIAGNOSIS — F23 Brief psychotic disorder: Secondary | ICD-10-CM

## 2023-03-26 DIAGNOSIS — Z1152 Encounter for screening for COVID-19: Secondary | ICD-10-CM | POA: Insufficient documentation

## 2023-03-26 HISTORY — DX: Other symptoms and signs involving appearance and behavior: R46.89

## 2023-03-26 LAB — CBC
HCT: 41.4 % (ref 39.0–52.0)
Hemoglobin: 14.5 g/dL (ref 13.0–17.0)
MCH: 28.8 pg (ref 26.0–34.0)
MCHC: 35 g/dL (ref 30.0–36.0)
MCV: 82.1 fL (ref 80.0–100.0)
Platelets: 265 10*3/uL (ref 150–400)
RBC: 5.04 MIL/uL (ref 4.22–5.81)
RDW: 12.9 % (ref 11.5–15.5)
WBC: 8.8 10*3/uL (ref 4.0–10.5)
nRBC: 0 % (ref 0.0–0.2)

## 2023-03-26 LAB — COMPREHENSIVE METABOLIC PANEL
ALT: 54 U/L — ABNORMAL HIGH (ref 0–44)
AST: 31 U/L (ref 15–41)
Albumin: 4.4 g/dL (ref 3.5–5.0)
Alkaline Phosphatase: 75 U/L (ref 38–126)
Anion gap: 10 (ref 5–15)
BUN: 22 mg/dL — ABNORMAL HIGH (ref 6–20)
CO2: 23 mmol/L (ref 22–32)
Calcium: 9 mg/dL (ref 8.9–10.3)
Chloride: 104 mmol/L (ref 98–111)
Creatinine, Ser: 1.01 mg/dL (ref 0.61–1.24)
GFR, Estimated: >60 mL/min (ref >60)
Glucose, Bld: 95 mg/dL (ref 70–99)
Potassium: 3.4 mmol/L — ABNORMAL LOW (ref 3.5–5.1)
Sodium: 137 mmol/L (ref 135–145)
Total Bilirubin: 1 mg/dL (ref 0.3–1.2)
Total Protein: 7.7 g/dL (ref 6.5–8.1)

## 2023-03-26 LAB — SALICYLATE LEVEL: Salicylate Lvl: <7 mg/dL — ABNORMAL LOW (ref 7.0–30.0)

## 2023-03-26 LAB — ACETAMINOPHEN LEVEL: Acetaminophen (Tylenol), Serum: <10 ug/mL — ABNORMAL LOW (ref 10–30)

## 2023-03-26 LAB — ETHANOL: Alcohol, Ethyl (B): <10 mg/dL (ref <10)

## 2023-03-26 MED ORDER — NICOTINE 21 MG/24HR TD PT24
21.0000 mg | MEDICATED_PATCH | Freq: Every day | TRANSDERMAL | Status: DC
  Filled 2023-03-26: qty 1

## 2023-03-26 MED ORDER — NICOTINE POLACRILEX 2 MG MT GUM: 2.0000 mg | CHEWING_GUM | OROMUCOSAL | Status: DC | PRN

## 2023-03-26 MED ORDER — IBUPROFEN 600 MG PO TABS: 600.0000 mg | ORAL_TABLET | Freq: Three times a day (TID) | ORAL | Status: DC | PRN

## 2023-03-26 MED ORDER — ONDANSETRON HCL 4 MG PO TABS: 4.0000 mg | ORAL_TABLET | Freq: Three times a day (TID) | ORAL | Status: DC | PRN

## 2023-03-26 MED ORDER — DIVALPROEX SODIUM ER 250 MG PO TB24
500.0000 mg | ORAL_TABLET | Freq: Two times a day (BID) | ORAL | Status: DC
  Administered 2023-03-26 – 2023-03-27 (×2): 500 mg via ORAL
  Filled 2023-03-26 (×3): qty 2

## 2023-03-26 MED ORDER — PROPRANOLOL HCL 10 MG PO TABS
10.0000 mg | ORAL_TABLET | Freq: Three times a day (TID) | ORAL | Status: DC
  Administered 2023-03-26: 10 mg via ORAL
  Filled 2023-03-26 (×3): qty 1

## 2023-03-26 MED ORDER — CLONAZEPAM 0.5 MG PO TABS
0.5000 mg | ORAL_TABLET | Freq: Two times a day (BID) | ORAL | Status: DC
  Administered 2023-03-26 – 2023-03-27 (×2): 0.5 mg via ORAL
  Filled 2023-03-26 (×2): qty 1

## 2023-03-26 MED ORDER — AMLODIPINE BESYLATE 5 MG PO TABS
10.0000 mg | ORAL_TABLET | Freq: Every day | ORAL | Status: DC
  Filled 2023-03-26: qty 2

## 2023-03-26 MED ORDER — CHLORPROMAZINE HCL 25 MG PO TABS
25.0000 mg | ORAL_TABLET | Freq: Three times a day (TID) | ORAL | Status: DC
  Administered 2023-03-26 – 2023-03-27 (×2): 25 mg via ORAL
  Filled 2023-03-26 (×4): qty 1

## 2023-03-26 MED ORDER — CLOZAPINE 100 MG PO TABS
300.0000 mg | ORAL_TABLET | Freq: Every day | ORAL | Status: DC
  Administered 2023-03-26: 300 mg via ORAL
  Filled 2023-03-26: qty 3

## 2023-03-26 MED ORDER — GLYCOPYRROLATE 1 MG PO TABS
2.0000 mg | ORAL_TABLET | Freq: Every day | ORAL | Status: DC
  Administered 2023-03-26: 2 mg via ORAL
  Filled 2023-03-26: qty 2

## 2023-03-26 MED ORDER — TRAZODONE HCL 100 MG PO TABS
100.0000 mg | ORAL_TABLET | Freq: Every day | ORAL | Status: DC
  Administered 2023-03-26: 100 mg via ORAL
  Filled 2023-03-26: qty 1

## 2023-03-26 MED ORDER — ALUM & MAG HYDROXIDE-SIMETH 200-200-20 MG/5ML PO SUSP: 30.0000 mL | Freq: Four times a day (QID) | ORAL | Status: DC | PRN

## 2023-03-26 MED ORDER — GLYCOPYRROLATE 1 MG PO TABS
1.0000 mg | ORAL_TABLET | Freq: Two times a day (BID) | ORAL | Status: DC
  Administered 2023-03-27: 1 mg via ORAL
  Filled 2023-03-26 (×2): qty 1

## 2023-03-26 NOTE — ED Notes (Signed)
Patient took HS PO medications with no issues.  Patient was given a sandwich tray and yogurt.
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
## 2023-03-26 NOTE — ED Notes (Signed)
Writer with [employer] officer and male security guard witness as pt changed out of own clothing and into hospital required scrubs, non-slip socks and mesh brief. Pts belongings placed into labeled bag to be taken to ED B[facility] storage are to be placed into assigned container.

## 2023-03-26 NOTE — ED Notes (Signed)
ivc prior to arrival/psych consult ordered/pending... 
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
## 2023-03-26 NOTE — BH Assessment (Signed)
Writer attempted to contact [name] (Mother) [phone] however there was no answer. A HIPAA compliant voicemail was left requesting a call back.

## 2023-03-26 NOTE — ED Provider Notes (Signed)
[facility] Provider Note    Event Date/Time   First MD Initiated Contact with Patient 03/26/23 2222     (approximate)   History   Chief Complaint: Aggressive Behavior   HPI  [name] [name] is a 27 y.o. male with a past history of schizoaffective disorder followed by [name] who is brought to the ED under involuntary commitment due to aggressive behavior and violent outburst.  Not taking his medications.  Also was having confusion earlier today, not recognizing his ACT team liaison.  Patient denies any acute complaints currently and states that he feels fine.     Physical Exam   Triage Vital Signs: ED Triage Vitals  Encounter Vitals Group     BP 03/26/23 2148 (!) 135/94     Systolic BP Percentile --      Diastolic BP Percentile --      Pulse Rate 03/26/23 2148 90     Resp 03/26/23 2148 16     Temp 03/26/23 2148 98.3 F (36.8 C)     Temp Source 03/26/23 2148 Oral     SpO2 03/26/23 2148 100 %     Weight 03/26/23 2149 220 lb (99.8 kg)     Height 03/26/23 2149 6\' 1"  (1.854 m)     Head Circumference --      Peak Flow --      Pain Score 03/26/23 2148 0     Pain Loc --      Pain Education --      Exclude from Growth Chart --     Most recent vital signs: Vitals:   03/26/23 2148  BP: (!) 135/94  Pulse: 90  Resp: 16  Temp: 98.3 F (36.8 C)  SpO2: 100%    General: Awake, no distress. CV:  Good peripheral perfusion.  Resp:  Normal effort.  Abd:  No distention.  Other:  No wounds   ED Results / Procedures / Treatments   Labs (all labs ordered are listed, but only abnormal results are displayed) Labs Reviewed  COMPREHENSIVE METABOLIC PANEL - Abnormal; Notable for the following components:      Result Value   Potassium 3.4 (*)    BUN 22 (*)    ALT 54 (*)    All other components within normal limits  SALICYLATE LEVEL - Abnormal; Notable for the following components:   Salicylate Lvl <7.0 (*)    All other components within  normal limits  ACETAMINOPHEN LEVEL - Abnormal; Notable for the following components:   Acetaminophen (Tylenol), Serum <10 (*)    All other components within normal limits  ETHANOL  CBC  URINE DRUG SCREEN, QUALITATIVE ([facility] ONLY)     EKG    RADIOLOGY    PROCEDURES:  Procedures   MEDICATIONS ORDERED IN ED: Medications  ibuprofen (ADVIL) tablet 600 mg (has no administration in time range)  ondansetron (ZOFRAN) tablet 4 mg (has no administration in time range)  alum & mag hydroxide-simeth (MAALOX/MYLANTA) 200-200-20 MG/5ML suspension 30 mL (has no administration in time range)     IMPRESSION / MDM / ASSESSMENT AND PLAN / ED COURSE  I reviewed the triage vital signs and the nursing notes.  Patient's presentation is most consistent with severe exacerbation of chronic illness.  Patient presents under IVC due to medication noncompliance for schizoaffective disorder.  He is prone to violence.  Will consult psychiatry and restart his medication.  The patient has been placed in psychiatric observation due to the need to provide  a safe environment for the patient while obtaining psychiatric consultation and evaluation, as well as ongoing medical and medication management to treat the patient's condition.  The patient has been placed under full IVC at this time.      FINAL CLINICAL IMPRESSION(S) / ED DIAGNOSES   Final diagnoses:  Other schizoaffective disorders (HCC)     Rx / DC Orders   ED Discharge Orders     None        Note:  This document was prepared using Dragon voice recognition software and may include unintentional dictation errors.   Sharman Cheek, MD 03/26/23 2232
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
## 2023-03-26 NOTE — ED Notes (Signed)
ENVIRONMENTAL ASSESSMENT Potentially harmful objects out of patient reach: Yes.   Personal belongings secured: Yes.   Patient dressed in hospital provided attire only: Yes.   Plastic bags out of patient reach: Yes.   Patient care equipment (cords, cables, call bells, lines, and drains) shortened, removed, or accounted for: Yes.   Equipment and supplies removed from bottom of stretcher: Yes.   Potentially toxic materials out of patient reach: Yes.   Sharps container removed or out of patient reach: Yes.    
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
## 2023-03-27 DIAGNOSIS — F23 Brief psychotic disorder: Secondary | ICD-10-CM

## 2023-03-27 LAB — CBC WITH DIFFERENTIAL/PLATELET
Abs Immature Granulocytes: 0.04 10*3/uL (ref 0.00–0.07)
Basophils Absolute: 0 10*3/uL (ref 0.0–0.1)
Basophils Relative: 0 %
Eosinophils Absolute: 0 10*3/uL (ref 0.0–0.5)
Eosinophils Relative: 0 %
HCT: 41.2 % (ref 39.0–52.0)
Hemoglobin: 14.4 g/dL (ref 13.0–17.0)
Immature Granulocytes: 1 %
Lymphocytes Relative: 35 %
Lymphs Abs: 2.7 10*3/uL (ref 0.7–4.0)
MCH: 28.6 pg (ref 26.0–34.0)
MCHC: 35 g/dL (ref 30.0–36.0)
MCV: 81.9 fL (ref 80.0–100.0)
Monocytes Absolute: 1 10*3/uL (ref 0.1–1.0)
Monocytes Relative: 13 %
Neutro Abs: 4 10*3/uL (ref 1.7–7.7)
Neutrophils Relative %: 51 %
Platelets: 237 10*3/uL (ref 150–400)
RBC: 5.03 MIL/uL (ref 4.22–5.81)
RDW: 12.9 % (ref 11.5–15.5)
WBC: 7.7 10*3/uL (ref 4.0–10.5)
nRBC: 0 % (ref 0.0–0.2)

## 2023-03-27 LAB — URINE DRUG SCREEN, QUALITATIVE (ARMC ONLY)
Amphetamines, Ur Screen: NOT DETECTED
Barbiturates, Ur Screen: NOT DETECTED
Benzodiazepine, Ur Scrn: NOT DETECTED
Cannabinoid 50 Ng, Ur ~~LOC~~: POSITIVE — AB
Cocaine Metabolite,Ur ~~LOC~~: NOT DETECTED
MDMA (Ecstasy)Ur Screen: NOT DETECTED
Methadone Scn, Ur: NOT DETECTED
Opiate, Ur Screen: NOT DETECTED
Phencyclidine (PCP) Ur S: NOT DETECTED
Tricyclic, Ur Screen: POSITIVE — AB

## 2023-03-27 LAB — SARS CORONAVIRUS 2 BY RT PCR: SARS Coronavirus 2 by RT PCR: NEGATIVE

## 2023-03-27 MED ORDER — PALIPERIDONE PALMITATE ER 234 MG/1.5ML IM SUSY
234.0000 mg | PREFILLED_SYRINGE | Freq: Once | INTRAMUSCULAR | Status: DC
  Filled 2023-03-27: qty 1.5

## 2023-03-27 MED ORDER — GLYCOPYRROLATE 1 MG PO TABS
1.5000 mg | ORAL_TABLET | Freq: Two times a day (BID) | ORAL | Status: DC
  Filled 2023-03-27: qty 2

## 2023-03-27 MED ORDER — CLOZAPINE 100 MG PO TABS: 150.0000 mg | ORAL_TABLET | Freq: Two times a day (BID) | ORAL | Status: DC

## 2023-03-27 MED ORDER — TEMAZEPAM 15 MG PO CAPS: 15.0000 mg | ORAL_CAPSULE | Freq: Every day | ORAL | Status: DC

## 2023-03-27 NOTE — BH Assessment (Signed)
Per [facility] Everardo Pacific), patient to be referred out of system.  Referral information for Psychiatric Hospitalization faxed to;   [facility] [phone]- [phone]) No beds available.   [name] [phone]),  [place] [phone]--- [phone]--- [phone]--- [phone])  [address] [phone]),   [name] [phone] -or- [phone]),   [facility] [phone]),  [place] [phone])  [facility] [phone])

## 2023-03-27 NOTE — ED Provider Notes (Addendum)
Emergency Medicine Observation Re-evaluation Note  [name] [name] is a 27 y.o. male, seen on rounds today.  Pt initially presented to the ED for complaints of Aggressive Behavior Currently, the patient is calm and cooperative.  Physical Exam  BP (!) 135/94 (BP Location: Right Arm)   Pulse 90   Temp 98.3 F (36.8 C) (Oral)   Resp 16   Ht 6\' 1"  (1.854 m)   Wt 99.8 kg   SpO2 100%   BMI 29.03 kg/m  Physical Exam General: No acute distress Cardiac: No cyanosis Lungs: Normal work of breathing Psych: Calm  ED Course / MDM  EKG:   I have reviewed the labs performed to date as well as medications administered while in observation.  Recent changes in the last 24 hours include no acute events.  Patient accepted to [facility] for ongoing psychiatric management    Janith Lima, MD 03/27/23 [id number]    Janith Lima, MD 03/27/23 1350

## 2023-03-27 NOTE — ED Notes (Signed)
During nursing assessment [name] was A/Ox 4 .  He  stated that he does not currently have thoughts or feelings of SI/HI.  [name] reported that he is not currently having auditory or visual hallucinations.  Pt affect is flat , eye contact is minimal , speech is clear/normal rate with appropriate verbiage noted.  Staff addressed any feelings or concerns that have been brought up.  Medications were administered as ordered. Continue to monitor patient as ordered for any changes in behaviors and for continued safety.

## 2023-03-27 NOTE — ED Notes (Signed)
ivc/consult done/recommend psychiatric inpatient admission when medically cleared.. 

## 2023-03-27 NOTE — ED Notes (Signed)
EMTALA reviewed by charge RN 

## 2023-03-27 NOTE — Consult Note (Signed)
  Patient previously recommended for inpatient psychiatry. Awaiting placement.
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
## 2023-03-27 NOTE — BH Assessment (Signed)
Patient has been accepted to [facility].  Patient assigned to the Adult Unit Accepting physician is Dr. Verta Ellen.  Pager number for report [phone].  Representative was [place].   ER Staff is aware of it:  Misty Stanley, ER Secretary  Morrie Sheldon  Patient's Nurse     Patient's [facility] [name]) have been updated as well.  Address: [address]  [place], [place] [address]

## 2023-03-27 NOTE — Progress Notes (Addendum)
Pharmacy Consult - Clozapine     27 yo males ordered clozapine 150 mg BID This patient's order has been reviewed for prescribing contraindications.    Clozapine REMS enrollment Verified:  YES DATE: 03/27/23  REMS patient ID: [id number]  Current Outpatient Monitoring: Monthly    Home Regimen:  150 mg PO BID Last dose: Past Week    Dose Adjustments This Admission: N/A   Labs: Date    ANC    Submitted 9/5 4000 Yes     Plan: 9/5 RDA: [id number] Monitor ANC at least weekly while inpatient  Gardner Candle, PharmD, BCPS Clinical Pharmacist 03/27/2023 1:04 PM

## 2023-03-27 NOTE — BH Assessment (Signed)
Negative COVID results faxed to [facility].

## 2023-03-27 NOTE — BH Assessment (Signed)
Patient has a pending bed with psych inpatient treatment facility but will need a COVID test before providing the acceptance information. ER MD (Dr. Anner Crete) and patient's nurse Morrie Sheldon) have been notified.

## 2023-03-27 NOTE — ED Notes (Signed)
[place]  [facility]  [employer]  called  for  transport  to  [facility] 

## 2023-03-31 NOTE — Group Note (Deleted)

## 2023-05-02 ENCOUNTER — Emergency Department
Admission: EM | Admit: 2023-05-02 | Discharge: 2023-05-28 | Disposition: A | Discharge status: Home / Self Care | Payer: MEDICAID | Attending: Emergency Medicine | Admitting: Emergency Medicine

## 2023-05-02 ENCOUNTER — Other Ambulatory Visit: Payer: Self-pay

## 2023-05-02 DIAGNOSIS — F203 Undifferentiated schizophrenia: Secondary | ICD-10-CM | POA: Diagnosis present

## 2023-05-02 DIAGNOSIS — R4589 Other symptoms and signs involving emotional state: Secondary | ICD-10-CM | POA: Diagnosis present

## 2023-05-02 DIAGNOSIS — F209 Schizophrenia, unspecified: Secondary | ICD-10-CM

## 2023-05-02 LAB — COMPREHENSIVE METABOLIC PANEL WITH GFR
ALT: 40 U/L (ref 0–44)
AST: 22 U/L (ref 15–41)
Albumin: 4.5 g/dL (ref 3.5–5.0)
Alkaline Phosphatase: 100 U/L (ref 38–126)
Anion gap: 13 (ref 5–15)
BUN: 12 mg/dL (ref 6–20)
CO2: 21 mmol/L — ABNORMAL LOW (ref 22–32)
Calcium: 9.2 mg/dL (ref 8.9–10.3)
Chloride: 102 mmol/L (ref 98–111)
Creatinine, Ser: 0.99 mg/dL (ref 0.61–1.24)
GFR, Estimated: >60 mL/min
Glucose, Bld: 94 mg/dL (ref 70–99)
Potassium: 3.8 mmol/L (ref 3.5–5.1)
Sodium: 136 mmol/L (ref 135–145)
Total Bilirubin: 1.4 mg/dL — ABNORMAL HIGH (ref 0.3–1.2)
Total Protein: 7.7 g/dL (ref 6.5–8.1)

## 2023-05-02 LAB — SALICYLATE LEVEL: Salicylate Lvl: <7 mg/dL — ABNORMAL LOW (ref 7.0–30.0)

## 2023-05-02 LAB — CBC
HCT: 44.7 % (ref 39.0–52.0)
Hemoglobin: 15.6 g/dL (ref 13.0–17.0)
MCH: 28.4 pg (ref 26.0–34.0)
MCHC: 34.9 g/dL (ref 30.0–36.0)
MCV: 81.3 fL (ref 80.0–100.0)
Platelets: 270 10*3/uL (ref 150–400)
RBC: 5.5 MIL/uL (ref 4.22–5.81)
RDW: 12.7 % (ref 11.5–15.5)
WBC: 4.5 10*3/uL (ref 4.0–10.5)
nRBC: 0 % (ref 0.0–0.2)

## 2023-05-02 LAB — ETHANOL: Alcohol, Ethyl (B): <10 mg/dL

## 2023-05-02 LAB — ACETAMINOPHEN LEVEL: Acetaminophen (Tylenol), Serum: <10 ug/mL — ABNORMAL LOW (ref 10–30)

## 2023-05-02 MED ORDER — DIPHENHYDRAMINE HCL 50 MG/ML IJ SOLN
25.0000 mg | Freq: Once | INTRAMUSCULAR | Status: AC
  Administered 2023-05-02: 25 mg via INTRAMUSCULAR
  Filled 2023-05-02: qty 1

## 2023-05-02 MED ORDER — TRAZODONE HCL 100 MG PO TABS
100.0000 mg | ORAL_TABLET | Freq: Every day | ORAL | Status: DC
  Administered 2023-05-02 – 2023-05-27 (×26): 100 mg via ORAL
  Filled 2023-05-02 (×25): qty 1

## 2023-05-02 MED ORDER — NICOTINE 21 MG/24HR TD PT24
21.0000 mg | MEDICATED_PATCH | Freq: Every day | TRANSDERMAL | Status: DC
  Administered 2023-05-03 – 2023-05-13 (×7): 21 mg via TRANSDERMAL
  Filled 2023-05-02 (×13): qty 1

## 2023-05-02 MED ORDER — MIDAZOLAM HCL 2 MG/2ML IJ SOLN
2.0000 mg | Freq: Once | INTRAMUSCULAR | Status: AC
  Administered 2023-05-02: 2 mg via INTRAMUSCULAR
  Filled 2023-05-02: qty 2

## 2023-05-02 MED ORDER — DIVALPROEX SODIUM ER 500 MG PO TB24
500.0000 mg | ORAL_TABLET | Freq: Two times a day (BID) | ORAL | Status: DC
  Administered 2023-05-02 – 2023-05-08 (×13): 500 mg via ORAL
  Filled 2023-05-02: qty 1
  Filled 2023-05-02: qty 2
  Filled 2023-05-02 (×2): qty 1
  Filled 2023-05-02: qty 2
  Filled 2023-05-02 (×2): qty 1
  Filled 2023-05-02 (×3): qty 2
  Filled 2023-05-02 (×5): qty 1

## 2023-05-02 MED ORDER — HALOPERIDOL LACTATE 5 MG/ML IJ SOLN
5.0000 mg | Freq: Once | INTRAMUSCULAR | Status: AC
  Administered 2023-05-02: 5 mg via INTRAMUSCULAR
  Filled 2023-05-02: qty 1
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
## 2023-05-02 NOTE — ED Notes (Signed)
At this time pt is sitting on bed eating his supper, calm, cooperative
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
## 2023-05-02 NOTE — BH Assessment (Signed)
TTS unable to complete consult. Patient unable to participate in interview at this time.

## 2023-05-02 NOTE — ED Notes (Signed)
IVC PENDING  CONSULT ?

## 2023-05-02 NOTE — ED Notes (Signed)
[employer] in BHU per security request so report of incident can be given

## 2023-05-02 NOTE — ED Notes (Signed)
Pt sleeping, snoring, NAD

## 2023-05-02 NOTE — ED Notes (Addendum)
Pt dressed out with this RN, Engineering geologist, male officer. Belongings include: Black shoes Blue hat Burgandy pants Black shirt

## 2023-05-02 NOTE — ED Triage Notes (Signed)
Pt to ED with Sheriff from jail IVC for beh med eval. Pt recently diagnosed with schizophrenia. Has not been making sense when speaking and is not making eye contact. When greeted by this RN for triage, pt began singing, and asked "why you fronting, we can slide right now, I aint even cry" Pt does not answer orientation questions appropriately.  Pt laughing inappropriately.

## 2023-05-02 NOTE — ED Notes (Addendum)
Pt arrived to BHU from subwait with deputy. Pt alert and smiling. Appears pleasant. Seen putting his arm around another pt and leaning close to other patients in the dayroom. Pt told by security that he needs to keep more distance between them and respect each others personal space. Verbalized understanding.

## 2023-05-02 NOTE — ED Notes (Signed)
Pt to restroom to attempt urine sample. Pt filled cup up with water. Given new cup for future use when able to provide sample.
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
## 2023-05-02 NOTE — ED Provider Notes (Signed)
[facility] Provider Note    None    (approximate)   History   No chief complaint on file.   HPI  [name] [name] is a 27 y.o. male with schizoaffective disorder who comes in with concern for abnormal behaviors.  Patient was arrested and comes in from jail under IVC due to acting erratically.  I reviewed his prior ER visits he does have a history of similar presentations.  Patient himself denies any SI, HI.  He reportedly has not been taking his medications for 30 days per IVC paperwork although patient himself states that he has been.  He states that he would like to kiss my hand or hold my hand.    Physical Exam   Triage Vital Signs: ED Triage Vitals [05/02/23 1257]  Encounter Vitals Group     BP (!) 138/109     Systolic BP Percentile      Diastolic BP Percentile      Pulse Rate 100     Resp 20     Temp 98.1 F (36.7 C)     Temp src      SpO2 98 %     Weight 280 lb (127 kg)     Height 6\' 1"  (1.854 m)     Head Circumference      Peak Flow      Pain Score 0     Pain Loc      Pain Education      Exclude from Growth Chart     Most recent vital signs: Vitals:   05/02/23 1257  BP: (!) 138/109  Pulse: 100  Resp: 20  Temp: 98.1 F (36.7 C)  SpO2: 98%     General: Awake, no distress.  CV:  Good peripheral perfusion.  Resp:  Normal effort.  Abd:  No distention.  Other:  Patient is pleasantly singing.  Denies SI HI   ED Results / Procedures / Treatments   Labs (all labs ordered are listed, but only abnormal results are displayed) Labs Reviewed  CBC  COMPREHENSIVE METABOLIC PANEL  ETHANOL  SALICYLATE LEVEL  ACETAMINOPHEN LEVEL  URINE DRUG SCREEN, QUALITATIVE ([facility] ONLY)      PROCEDURES:  Critical Care performed: No  Procedures   MEDICATIONS ORDERED IN ED: Medications - No data to display   IMPRESSION / MDM / ASSESSMENT AND PLAN / ED COURSE  I reviewed the triage vital signs and the nursing notes.   Patient's  presentation is most consistent with severe exacerbation of chronic illness.   Pt is without any acute medical complaints. No exam findings to suggest medical cause of current presentation. Will order psychiatric screening labs and discuss further w/ psychiatric service.  D/d includes but is not limited to psychiatric disease, behavioral/personality disorder, inadequate socioeconomic support, medical.  Based on HPI, exam, unremarkable labs, no concern for acute medical problem at this time. No rigidity, clonus, hyperthermia, focal neurologic deficit, diaphoresis, tachycardia, meningismus, ataxia, gait abnormality or other finding to suggest this visit represents a non-psychiatric problem. Screening labs reviewed.    Given this, pt medically cleared, to be dispositioned per Psych.    The patient has been placed in psychiatric observation due to the need to provide a safe environment for the patient while obtaining psychiatric consultation and evaluation, as well as ongoing medical and medication management to treat the patient's condition.  The patient has been placed under full IVC at this time.       FINAL CLINICAL  IMPRESSION(S) / ED DIAGNOSES   Final diagnoses:  Schizophrenia, unspecified type (HCC)     Rx / DC Orders   ED Discharge Orders     None        Note:  This document was prepared using Dragon voice recognition software and may include unintentional dictation errors.   Concha Se, MD 05/02/23 1341

## 2023-05-02 NOTE — ED Notes (Signed)
Pt handcuffed by Scientific laboratory technician.

## 2023-05-02 NOTE — ED Notes (Signed)
Psych NP in to see patient.

## 2023-05-02 NOTE — Consult Note (Signed)
Reason for Consult:Psychiatric evaluation Referring Physician: Kara Pacer, MD  [name] [name] is an 27 y.o. male.  HPI:  "I am ready to go home"  [name] [name] is a 27 year old male who presented to ED involuntarily from jail, secondary to altered mental status characterized by bizarre behaviors and disorganized thinking. Per chart review, patient has a hx of Schizophrenia with  multiple inpatient admissions. Last encounter was sept  4, 2024 when he was evaluated at ED and sent to [facility] for treatment.   Patient reports that he has been in jail for a month for stealing cigarettes at the gas station.  The jail staff reported that patient was experiencing bizarre behaviors and was sent in for evaluation. Patient is currently denying  SI/HI/AVH and states "I am ready to go home, I am released from jail". He minimizes his mental illness and reports that "I take my medication, my Thorazine".   Assessment:27 year old male who appears  healthy and well nourished. He is disheveled with poor hygiene. Ge is guarded with poor eye contact. He is polite and responds by "yes miss, no miss..."  He avoids long/detailed conversations and responds with short statements.  He is alert and oriented x 4.  He reports that he has been in jail for 1 month secondary to stealing cigarettes. He reports that he is now released and wants to be discharged to his mother's house.  He is knowledgeable of his mental illness but minimizes the severity. He reports that he has been taking his Thorazine as prescribed.  Patient does not appear to be responding to internal stimuli  but appears to be paranoid. He  frequently requests to be discharged to his mother's house.   Per chart/nursing note review, patient became physically aggressive toward  ED staff members, was handcuffed for safety and was placed in safety room. Patient was medicated and calmed down later. During this assessment, patient is calm and cooperative and  apologizing for previous behaviors.  We will continue overnight monitoring at ED and will reassess in AM.  Agitation protocol ordered.   Past Medical History:  Diagnosis Date   Aggressive behavior    Asthma    Depression    Psychosis (HCC)    Schizoaffective disorder (HCC)     Past Surgical History:  Procedure Laterality Date   BACK SURGERY     I&D groin  2017    History reviewed. No pertinent family history.  Social History:  reports that he has been smoking cigarettes. He has never used smokeless tobacco. He reports current drug use. Drugs: Marijuana, Methamphetamines, and Cocaine. He reports that he does not drink alcohol.  Allergies: No Known Allergies  Medications: I have reviewed the patient's current medications.  Results for orders placed or performed during the hospital encounter of 05/02/23 (from the past 48 hour(s))  Comprehensive metabolic panel     Status: Abnormal   Collection Time: 05/02/23 12:59 PM  Result Value Ref Range   Sodium 136 135 - 145 mmol/L   Potassium 3.8 3.5 - 5.1 mmol/L   Chloride 102 98 - 111 mmol/L   CO2 21 (L) 22 - 32 mmol/L   Glucose, Bld 94 70 - 99 mg/dL    Comment: Glucose reference range applies only to samples taken after fasting for at least 8 hours.   BUN 12 6 - 20 mg/dL   Creatinine, Ser 1.61 0.61 - 1.24 mg/dL   Calcium 9.2 8.9 - 09.6 mg/dL  Total Protein 7.7 6.5 - 8.1 g/dL   Albumin 4.5 3.5 - 5.0 g/dL   AST 22 15 - 41 U/L   ALT 40 0 - 44 U/L   Alkaline Phosphatase 100 38 - 126 U/L   Total Bilirubin 1.4 (H) 0.3 - 1.2 mg/dL   GFR, Estimated >03 >47 mL/min    Comment: (NOTE) Calculated using the CKD-EPI Creatinine Equation (2021)    Anion gap 13 5 - 15    Comment: Performed at [facility], [address] [place], [place] [address]  Ethanol     Status: None   Collection Time: 05/02/23 12:59 PM  Result Value Ref Range   Alcohol, Ethyl (B) <10 <10 mg/dL    Comment: (NOTE) Lowest detectable limit for serum alcohol  is 10 mg/dL.  For medical purposes only. Performed at [facility], [address] [place], [place] [address]   Salicylate level     Status: Abnormal   Collection Time: 05/02/23 12:59 PM  Result Value Ref Range   Salicylate Lvl <7.0 (L) 7.0 - 30.0 mg/dL    Comment: Performed at [facility], [address] [place], [place] [address]  Acetaminophen level     Status: Abnormal   Collection Time: 05/02/23 12:59 PM  Result Value Ref Range   Acetaminophen (Tylenol), Serum <10 (L) 10 - 30 ug/mL    Comment: (NOTE) Therapeutic concentrations vary significantly. A range of 10-30 ug/mL  may be an effective concentration for many patients. However, some  are best treated at concentrations outside of this range. Acetaminophen concentrations >150 ug/mL at 4 hours after ingestion  and >50 ug/mL at 12 hours after ingestion are often associated with  toxic reactions.  Performed at [facility], [address] [place], [place] [address]   cbc     Status: None   Collection Time: 05/02/23 12:59 PM  Result Value Ref Range   WBC 4.5 4.0 - 10.5 K/uL   RBC 5.50 4.22 - 5.81 MIL/uL   Hemoglobin 15.6 13.0 - 17.0 g/dL   HCT 41.6 60.6 - 30.1 %   MCV 81.3 80.0 - 100.0 fL   MCH 28.4 26.0 - 34.0 pg   MCHC 34.9 30.0 - 36.0 g/dL   RDW 60.1 09.3 - 23.5 %   Platelets 270 150 - 400 K/uL   nRBC 0.0 0.0 - 0.2 %    Comment: Performed at [facility], [address] [place], [place] [address]    No results found.  Review of Systems  Constitutional: Negative.   HENT: Negative.    Eyes: Negative.   Respiratory: Negative.    Cardiovascular: Negative.   Gastrointestinal: Negative.   Endocrine: Negative.   Genitourinary: Negative.   Musculoskeletal: Negative.   Allergic/Immunologic: Negative.   Neurological: Negative.   Hematological: Negative.   Psychiatric/Behavioral:  Positive for agitation and behavioral problems.    Blood pressure 124/88, pulse 87,  temperature 98.6 F (37 C), temperature source Oral, resp. rate 18, height 6\' 1"  (1.854 m), weight 127 kg, SpO2 99%. Physical Exam Vitals and nursing note reviewed.  Constitutional:      Appearance: Normal appearance.  HENT:     Head: Normocephalic and atraumatic.     Right Ear: Tympanic membrane normal.     Left Ear: Tympanic membrane normal.     Nose: Nose normal.     Mouth/Throat:     Mouth: Mucous membranes are moist.  Eyes:     Extraocular Movements: Extraocular movements intact.  Pupils: Pupils are equal, round, and reactive to light.  Cardiovascular:     Rate and Rhythm: Normal rate.     Pulses: Normal pulses.  Pulmonary:     Effort: Pulmonary effort is normal.  Musculoskeletal:        General: Normal range of motion.     Cervical back: Normal range of motion and neck supple.  Neurological:     General: No focal deficit present.     Mental Status: He is alert and oriented to person, place, and time.     Assessment/Plan:  Overnight monitoring at ED Continue agitation protocol  Reassess in AM to determine disposition.   Olin Pia 05/02/2023, 10:16 PM

## 2023-05-02 NOTE — ED Notes (Signed)
Pt moved to BHU 8 via wheelchair with security and nursing staff. Pt remained in handcuffs until safety in room and then handcuffs removed per policy. Pt appears more cooperative and apologized to staff for being violent. Will remain in locked 3 bed unit for safety of patient population in [place].

## 2023-05-03 DIAGNOSIS — F203 Undifferentiated schizophrenia: Secondary | ICD-10-CM | POA: Diagnosis not present

## 2023-05-03 MED ORDER — DROPERIDOL 2.5 MG/ML IJ SOLN: 5.0000 mg | Freq: Once | INTRAMUSCULAR | Status: DC

## 2023-05-03 MED ORDER — LORAZEPAM 2 MG/ML IJ SOLN
2.0000 mg | Freq: Once | INTRAMUSCULAR | Status: AC
  Administered 2023-05-03: 2 mg via INTRAMUSCULAR

## 2023-05-03 MED ORDER — DIPHENHYDRAMINE HCL 50 MG/ML IJ SOLN
50.0000 mg | Freq: Once | INTRAMUSCULAR | Status: AC
  Administered 2023-05-03: 50 mg via INTRAMUSCULAR

## 2023-05-03 MED ORDER — DROPERIDOL 2.5 MG/ML IJ SOLN
5.0000 mg | Freq: Once | INTRAMUSCULAR | Status: AC
  Administered 2023-05-03: 5 mg via INTRAMUSCULAR
  Filled 2023-05-03: qty 2

## 2023-05-03 NOTE — ED Notes (Signed)
This RN heard pt yelling "Don't look at me, you can't see me. Don't look at me lady! If you can't see me then don't look at me." This RN came out of room 22 to ask pt what's going on. Pt states I can stand in my door and talk and they say I can't. This RN informed pt we have had a lot going on today with pt's and behavior so today we are trying to keep everyone safe by asking them to stay in there rooms. Pt was able to calm down and politely ask what time it is and then if we could change the channel on his TV. Pt very polite as this RN changed the channel in his room.

## 2023-05-03 NOTE — ED Provider Notes (Addendum)
Emergency Medicine Observation Re-evaluation Note  [name] [name] is a 27 y.o. male, seen on rounds today.  Pt initially presented to the ED for complaints of No chief complaint on file. Currently, the patient is resting comfortably.  Physical Exam  BP 124/88 (BP Location: Right Arm)   Pulse 87   Temp 98.6 F (37 C) (Oral)   Resp 18   Ht 6\' 1"  (1.854 m)   Wt 127 kg   SpO2 99%   BMI 36.94 kg/m  Physical Exam General: no acute distress Cardiac: no cyanosis Lungs: normal work of breathing Psych: calm, cooperative  ED Course / MDM  EKG:   I have reviewed the labs performed to date as well as medications administered while in observation.  Recent changes in the last 24 hours include no acute events.  Plan  Current plan is for psychiatric placement.  Psych update @ 12:45PM: "Patient restarted on psychiatric medications <24 hours ago; Considering this, recommend for overnight observation where he can monitored for mood stability and safety.  Recommend AM psychiatry reassessment and possible discharge to his ACTT is he continues to do well."    Janith Lima, MD 05/03/23 0730    Janith Lima, MD 05/03/23 1245

## 2023-05-03 NOTE — ED Notes (Signed)
Hospital snack tray provided to pt by RN at this time.

## 2023-05-03 NOTE — ED Notes (Signed)
Pt is in restraint chair, environment secured

## 2023-05-03 NOTE — ED Notes (Signed)
Pt is in restraint, environment secured.

## 2023-05-03 NOTE — BH Assessment (Signed)
Comprehensive Clinical Assessment (CCA) Note  05/03/2023 [name] [name] [id number]  Chief Complaint: No chief complaint on file.  Visit Diagnosis: Schizoaffective Disorder   Robertson A. Maxim is a 27 year old male who presents to the ER due to the jail staff having concerns about his mental health. Per the patient he was arrested for drinking some beer and placed it back on the shelf, without paying for it. It was reported he's been in jail for thirty days and during that time he hasn't had his medications. It was also reported, he wasn't taking his medication prior to him going to jail. During the interview, the patient was calm, cooperative and pleasant. He provided appropriate answers to the questions. He had insight about his behaviors and understands it wasn't the best decision. As well as shared, what he would do differently if he wanted something from the store.  CCA Screening, Triage and Referral (STR)  Patient Reported Information How did you hear about [place]? Self  What Is the Reason for Your Visit/Call Today? Patient brought to the ER from jail due to having concern about his mental health.  How Long Has This Been Causing You Problems? 1 wk - 1 month  What Do You Feel Would Help You the Most Today? Treatment for Depression or other mood problem   Have You Recently Had Any Thoughts About Hurting Yourself? No  Are You Planning to Commit Suicide/Harm Yourself At This time? No   Flowsheet Row ED from 05/02/2023 in [facility] Emergency Department at [facility] ED from 03/26/2023 in [facility] Emergency Department at [facility] ED from 02/14/2023 in [facility] Emergency Department at [facility]  C-SSRS RISK CATEGORY No Risk No Risk No Risk       Have you Recently Had Thoughts About Hurting Someone [name]? No  Are You Planning to Harm Someone at This Time? No  Explanation: n/a  Have You Used Any Alcohol or Drugs in the Past 24 Hours? No  What Did You  Use and How Much? n/a  Do You Currently Have a Therapist/Psychiatrist? Yes  Name of Therapist/Psychiatrist: Name of Therapist/Psychiatrist: ACT Team  Have You Been Recently Discharged From Any Office Practice or Programs? No  Explanation of Discharge From Practice/Program: n/a   CCA Screening Triage Referral Assessment Type of Contact: Face-to-Face  Telemedicine Service Delivery:   Is this Initial or Reassessment?   Date Telepsych consult ordered in [facility]:    Time Telepsych consult ordered in [facility]L:    Location of Assessment: [facility] ED  Provider Location: [facility]  Collateral Involvement: [name] (Mother)  [phone]  Does Patient Have a Court Appointed Legal Guardian? No  Legal Guardian Contact Information: n/a  Copy of Legal Guardianship Form: -- (n/a)  Legal Guardian Notified of Arrival: Attempted notification unsuccessful  Legal Guardian Notified of Pending Discharge: -- (n/a)  If Minor and Not Living with Parent(s), Who has Custody? n/a  Is CPS involved or ever been involved? Never  Is APS involved or ever been involved? Never   Patient Determined To Be At Risk for Harm To Self or Others Based on Review of Patient Reported Information or Presenting Complaint? No  Method: No Plan  Availability of Means: No access or NA  Intent: Vague intent or NA  Notification Required: No need or identified person  Additional Information for Danger to Others Potential: Previous attempts  Additional Comments for Danger to Others Potential: Pt was physically aggressive towards mother prior to arrival.  Are There Guns or  Other Weapons in Your Home? No  Types of Guns/Weapons: n/a  Are These Weapons Safely Secured?                            No  Who Could Verify You Are Able To Have These Secured: n/a  Do You Have any Outstanding Charges, Pending Court Dates, Parole/Probation? None reported  Contacted To Inform of Risk of Harm To Self or Others: Other: Comment  Does  Patient Present under Involuntary Commitment? Yes  [place] of Residence: [place]  Patient Currently Receiving the Following Services: ACTT Psychologist, educational)  Determination of Need: Emergent (2 hours)  Options For Referral: Inpatient Hospitalization  CCA Biopsychosocial Patient Reported Schizophrenia/Schizoaffective Diagnosis in Past: Yes   Strengths: Have an ACT Team, have a support system, and have stable housing.   Mental Health Symptoms Depression:   Change in energy/activity; Difficulty Concentrating; Irritability   Duration of Depressive symptoms:  Duration of Depressive Symptoms: Greater than two weeks   Mania:   Change in energy/activity; Increased Energy; Racing thoughts; Recklessness   Anxiety:    Restlessness; Tension; Difficulty concentrating   Psychosis:   Other negative symptoms   Duration of Psychotic symptoms:  Duration of Psychotic Symptoms: Greater than six months   Trauma:   N/A   Obsessions:   N/A   Compulsions:   N/A   Inattention:   N/A   Hyperactivity/Impulsivity:   N/A   Oppositional/Defiant Behaviors:   N/A   Emotional Irregularity:   N/A   Other Mood/Personality Symptoms:  No data recorded   Mental Status Exam Appearance and self-care  Stature:   Average   Weight:   Average weight   Clothing:   Neat/clean; Age-appropriate   Grooming:   Normal   Cosmetic use:   None   Posture/gait:   Normal   Motor activity:   -- (Within normal range)   Sensorium  Attention:   Normal   Concentration:   Scattered   Orientation:   X5   Recall/memory:   Normal   Affect and Mood  Affect:   Appropriate; Full Range   Mood:   Euthymic   Relating  Eye contact:   Normal   Facial expression:   Responsive   Attitude toward examiner:   Cooperative   Thought and Language  Speech flow:  Clear and Coherent   Thought content:   Appropriate to Mood and Circumstances   Preoccupation:   None    Hallucinations:   None   Organization:   Intact   [employer] of Knowledge:   Fair   Intelligence:   Average   Abstraction:   Concrete   Judgement:   Impaired   Reality Testing:   Adequate   Insight:   Flashes of insight   Decision Making:   Impulsive   Social Functioning  Social Maturity:   Impulsive; Responsible   Social Judgement:   Heedless; Naive   Stress  Stressors:   Other (Comment)   Coping Ability:   Exhausted; Overwhelmed   Skill Deficits:   None   Supports:   Family; Friends/Service system    Religion: Religion/Spirituality Are You A Religious Person?: Yes What is Your Religious Affiliation?: Muslim  Leisure/Recreation: Leisure / Recreation Do You Have Hobbies?: No  Exercise/Diet: Exercise/Diet Do You Exercise?: No Have You Gained or Lost A Significant Amount of Weight in the Past Six Months?: No Do You Follow a Special Diet?: No  Do You Have Any Trouble Sleeping?: No  CCA Employment/Education Employment/Work Situation:   Education:   CCA Family/Childhood History Family and Relationship History: Family history Marital status: Single Does patient have children?: No  Childhood History:  Childhood History By whom was/is the patient raised?: Mother Did patient suffer any verbal/emotional/physical/sexual abuse as a child?: No Did patient suffer from severe childhood neglect?: No Has patient ever been sexually abused/assaulted/raped as an adolescent or adult?: No Was the patient ever a victim of a crime or a disaster?: No Witnessed domestic violence?: No Has patient been affected by domestic violence as an adult?: No       CCA Substance Use Alcohol/Drug Use: Alcohol / Drug Use Pain Medications: See PTA Prescriptions: See PTA Over the Counter: See PTA History of alcohol / drug use?: Yes Longest period of sobriety (when/how long): Unable to quantify Substance #1 Name of Substance 1:  Alcohol   ASAM's:  Six Dimensions of Multidimensional Assessment  Dimension 1:  Acute Intoxication and/or Withdrawal Potential:      Dimension 2:  Biomedical Conditions and Complications:      Dimension 3:  Emotional, Behavioral, or Cognitive Conditions and Complications:     Dimension 4:  Readiness to Change:     Dimension 5:  Relapse, Continued use, or Continued Problem Potential:     Dimension 6:  Recovery/Living Environment:     ASAM Severity Score:    ASAM Recommended Level of Treatment:     Substance use Disorder (SUD)    Recommendations for Services/Supports/Treatments:    Discharge Disposition:    DSM5 Diagnoses: Patient Active Problem List   Diagnosis Date Noted   Acute psychosis (HCC) 03/27/2023   Schizophrenia, undifferentiated (HCC) 04/27/2022   Hypertension 03/06/2018   Schizoaffective disorder, bipolar type (HCC) 12/30/2017   Non compliance w medication regimen 12/30/2017    Referrals to Alternative Service(s): Referred to Alternative Service(s):   Place:   Date:   Time:    Referred to Alternative Service(s):   Place:   Date:   Time:    Referred to Alternative Service(s):   Place:   Date:   Time:    Referred to Alternative Service(s):   Place:   Date:   Time:     Lilyan Gilford MS, LCAS, [facility], [facility] Therapeutic Triage Specialist 05/03/2023 12:37 PM

## 2023-05-03 NOTE — ED Notes (Addendum)
Pt to bathroom again. Pt has escorted himself to and from the bathroom 5 times in the past 20 minutes. EDT searched and cleaned bathroom with bleach wipes.

## 2023-05-03 NOTE — ED Notes (Signed)
Patient observed via camera pacing around room.

## 2023-05-03 NOTE — ED Notes (Signed)
Pt in restraint chair, environment secured, yelled "Get me out of this chair, I will behave. "
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
## 2023-05-03 NOTE — ED Notes (Signed)
Pt asked to lay down on bed in [facility] after exiting bathroom. Pt informed by Security to return to his assigned room to lay down. Pt ambulated back to his room with a steady gait and is currently standing in his doorway.

## 2023-05-03 NOTE — ED Notes (Signed)
Pt was removed from restraint chair, provided evening snack.

## 2023-05-03 NOTE — ED Notes (Signed)
Pt in restraint, environment secured. RN Feliz Beam obtained pts vitals and this nurse tech was in the room while obtaining.l
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
## 2023-05-03 NOTE — ED Notes (Signed)
Lunch trash removed from pts room.

## 2023-05-03 NOTE — ED Notes (Signed)
PT IVC/ PENDING REASSESSMENT

## 2023-05-03 NOTE — ED Notes (Signed)
Pt provided phone at this time. Pt aware EDT will be back to recollect the phone in 10 minutes.
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
## 2023-05-03 NOTE — ED Notes (Signed)
Pt in restraint chair, environment secured.

## 2023-05-03 NOTE — ED Notes (Signed)
Pt standing in doorway to room talking in complete, coherent sentences to Security and other pts. Pt singings and verbally interacting politely at this time.

## 2023-05-03 NOTE — ED Notes (Signed)
Pt escalating and tyelling at pt across the hall in room 23. Pt refused to consume the provided hospital dinner tray so EDT brought trash can and pt willingly threw away his dinner.  Pt redirected back into his room by RN at this time.

## 2023-05-03 NOTE — ED Notes (Signed)
Pt meal tray and juice provided

## 2023-05-03 NOTE — ED Notes (Signed)
Pt in hall speaking to security asking to use phone. EDT Luther Parody provided pt phone.

## 2023-05-03 NOTE — ED Notes (Signed)
Plastics removed from pts hospital dinner tray and dinner tray provided to pt along with a fresh soda of pts choice. Pt made aware no other soda would be provided tonight and dinner trash would be collected after meal is consumed. EDT informed pt no other food would be provided until snack time at 20:00.

## 2023-05-04 MED ORDER — ZIPRASIDONE MESYLATE 20 MG IM SOLR: 20.0000 mg | INTRAMUSCULAR | Status: DC | PRN

## 2023-05-04 MED ORDER — NICOTINE POLACRILEX 2 MG MT GUM: 2.0000 mg | CHEWING_GUM | OROMUCOSAL | Status: DC | PRN

## 2023-05-04 MED ORDER — OLANZAPINE 5 MG PO TBDP
10.0000 mg | ORAL_TABLET | Freq: Three times a day (TID) | ORAL | Status: DC | PRN
  Administered 2023-05-04 – 2023-05-23 (×14): 10 mg via ORAL
  Filled 2023-05-04 (×6): qty 1
  Filled 2023-05-04: qty 2
  Filled 2023-05-04 (×6): qty 1
  Filled 2023-05-04: qty 2

## 2023-05-04 MED ORDER — LORAZEPAM 1 MG PO TABS
1.0000 mg | ORAL_TABLET | ORAL | Status: AC | PRN
  Administered 2023-05-05: 1 mg via ORAL
  Filled 2023-05-04 (×2): qty 1

## 2023-05-04 MED ORDER — ADULT MULTIVITAMIN W/MINERALS CH
1.0000 | ORAL_TABLET | Freq: Every day | ORAL | Status: DC
  Administered 2023-05-04 – 2023-05-28 (×25): 1 via ORAL
  Filled 2023-05-04 (×25): qty 1

## 2023-05-04 MED ORDER — AMLODIPINE BESYLATE 5 MG PO TABS
10.0000 mg | ORAL_TABLET | Freq: Every day | ORAL | Status: DC
  Administered 2023-05-04 – 2023-05-28 (×25): 10 mg via ORAL
  Filled 2023-05-04 (×25): qty 2

## 2023-05-04 MED ORDER — CHLORPROMAZINE HCL 25 MG PO TABS
25.0000 mg | ORAL_TABLET | Freq: Three times a day (TID) | ORAL | Status: DC
  Administered 2023-05-04 – 2023-05-11 (×20): 25 mg via ORAL
  Filled 2023-05-04 (×23): qty 1

## 2023-05-04 MED ORDER — MULTIVITAMIN ADULT PO TABS: ORAL_TABLET | Freq: Every day | ORAL | Status: DC

## 2023-05-04 MED ORDER — PROPRANOLOL HCL 20 MG PO TABS
10.0000 mg | ORAL_TABLET | Freq: Three times a day (TID) | ORAL | Status: DC
  Administered 2023-05-04 – 2023-05-28 (×69): 10 mg via ORAL
  Filled 2023-05-04 (×77): qty 1

## 2023-05-04 NOTE — ED Notes (Signed)
Pt taking shower. Pt was given hygiene items and the following, 1 clean top, 1 clean bottom, with 1 pair of disposable underwear.  Pt changed out into clean clothing.  Staff disposed of all shower supplies.   

## 2023-05-04 NOTE — BH Assessment (Signed)
Per [facility] Tresa Endo S.,), patient to be referred out of system.  Referral information for Psychiatric Hospitalization faxed to;   [facility] [phone]- [phone]), no available bed  [name] [phone]- [phone]),   [name] [phone]),  [facility] [phone], [phone], [phone] or [phone]),   [place] [phone]--- [phone]--- [phone]--- [phone])  [address] [phone]),   [facility] [phone]),   [name] [phone] -or- [phone]),   [facility] [phone]),  [place] [phone])  [facility] [phone]).  [facility] [phone])

## 2023-05-04 NOTE — ED Notes (Signed)
Patient in bathroom and shower noted to be on.  Patient instructed to stop shower and back to room.  Patient refused.  Water to shower cut off and patient instructed to dress and return to room, patient complied.

## 2023-05-04 NOTE — ED Notes (Signed)
Patient given breakfast tray and beverage

## 2023-05-04 NOTE — BH Assessment (Signed)
Received phone call from [facility] ([phone]), patient placed on the "Priority Wait List."

## 2023-05-04 NOTE — BH Assessment (Signed)
Patient referred to [facility], pending review.   State Referral Form and supporting documentation completed and faxed to [facility].  Verbal screening completed with [facility]([phone]), information faxed and confirmed it was received.
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
## 2023-05-04 NOTE — ED Provider Notes (Signed)
Emergency Medicine Observation Re-evaluation Note  Nicco A Whitham is a 27 y.o. male, seen on rounds today.  Pt initially presented to the ED for complaints of No chief complaint on file. Currently, the patient is resting, voices no medical complaints.  Physical Exam  BP 127/84   Pulse 81   Temp 98.3 F (36.8 C) (Oral)   Resp 16   Ht 6\' 1"  (1.854 m)   Wt 127 kg   SpO2 99%   BMI 36.94 kg/m  Physical Exam General: Resting in no acute distress Cardiac: No cyanosis Lungs: Equal rise and fall Psych: Not agitated  ED Course / MDM  EKG:   I have reviewed the labs performed to date as well as medications administered while in observation.  Recent changes in the last 24 hours include no events overnight.  Plan  Current plan is for psychiatric disposition.    Irean Hong, MD 05/04/23 [phone]

## 2023-05-04 NOTE — ED Notes (Signed)
PT IVC/ PENDING PLACEMENT

## 2023-05-04 NOTE — ED Notes (Signed)
[employer] told pt to step back in his room.  Pt started calling her inappropriate names" you stupid bitch, she is telling to step back in my room. She doesn't tell me what to do. She [place] ugly. she looks like diarrhea." [employer] officer talked to pt and made him aware to not use inappropriate language. Pt is back in his room. This tech was sitting in room 24 as sitter when he said these inappropriate words

## 2023-05-04 NOTE — ED Notes (Signed)
Patient restless and agitated. Asked multiple times by staff to return to room and refused. PRN zyprexa given for agitation. Patient stood close to Clinical research associate and said repeated writers name. Accepted medication without issue. Medication effective. Patient laid down in bed.

## 2023-05-05 DIAGNOSIS — F203 Undifferentiated schizophrenia: Secondary | ICD-10-CM | POA: Diagnosis not present

## 2023-05-05 MED ORDER — DOXYLAMINE SUCCINATE (SLEEP) 25 MG PO TABS: 50.0000 mg | ORAL_TABLET | Freq: Once | ORAL | Status: DC

## 2023-05-05 MED ORDER — DOXYLAMINE SUCCINATE (SLEEP) 25 MG PO TABS
25.0000 mg | ORAL_TABLET | Freq: Once | ORAL | Status: AC
  Administered 2023-05-05: 25 mg via ORAL
  Filled 2023-05-05: qty 1

## 2023-05-05 NOTE — Consult Note (Signed)
Patient reassessed by psychiatry, continues to meet criteria for inpatient admission.  Reassessment note is pending.

## 2023-05-05 NOTE — ED Notes (Signed)
Pt updated on need for urine sample, voices understanding.

## 2023-05-05 NOTE — ED Notes (Addendum)
To pts room. Pt alert, calm, requesting to go into dayroom. Pt told that due to his behavior, he could not be in dayroom at this time. Pt requested a sprite, given.

## 2023-05-05 NOTE — ED Notes (Signed)
Writer obtained vitals from patient with security x 2 with Clinical research associate. Patient asking to be moved to another room so he can listen to music.  Writer informed patient we currently don't have any other rooms available at this time and I am sorry.

## 2023-05-05 NOTE — ED Notes (Addendum)
Pt completed telepsych assessment. RN noticed pt became increasingly anxious after assessment, restlessness and unable to relax. PRN given.

## 2023-05-05 NOTE — ED Notes (Signed)
Pt requested sandwich tray, pt reminded of unit rules

## 2023-05-05 NOTE — ED Notes (Signed)
Pt awake, calm, cooperative. Took meds, eating dinner. no acute issues at this time.

## 2023-05-05 NOTE — ED Notes (Signed)
Pt requested shower; provided clean hospital clothing and linens.  Shower setup provided with soap, shampoo, toothbrush/toothpaste, and deodorant.  Pt able to preform own ADL's with no assistance.    

## 2023-05-05 NOTE — ED Notes (Addendum)
RN noted that pt is becoming increasingly agitated with other pt's in the immediate area regarding math problems. Pt expresses anger regarding how he is correct about the math problems. RN encouraged patient to return to room and stay in his immediate area. PRN offered.

## 2023-05-05 NOTE — BH Assessment (Signed)
Confirmed with [facility] ([phone]), patient is on their Waitlist.

## 2023-05-05 NOTE — ED Notes (Signed)
Patient pacing in locked unit.

## 2023-05-05 NOTE — ED Provider Notes (Signed)
Emergency Medicine Observation Re-evaluation Note  [name] [name] is a 27 y.o. male, seen on rounds today.  Pt initially presented to the ED for complaints of No chief complaint on file.  Currently, the patient is is no acute distress. Denies any concerns at this time.  Physical Exam  Blood pressure 134/77, pulse 88, temperature 97.6 F (36.4 C), temperature source Oral, resp. rate 19, height 6\' 1"  (1.854 m), weight 127 kg, SpO2 99%.  Physical Exam: General: No apparent distress Pulm: Normal WOB Neuro: Moving all extremities Psych: Resting comfortably     ED Course / MDM     I have reviewed the labs performed to date as well as medications administered while in observation.  Recent changes in the last 24 hours include: No acute events overnight.  Plan   Current plan: Patient awaiting psychiatric disposition. Patient is under full IVC at this time.    Matea Stanard, Layla Maw, DO 05/05/23 [phone]

## 2023-05-05 NOTE — ED Notes (Signed)
Breakfast placed at side.
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
## 2023-05-05 NOTE — ED Notes (Signed)
Attempted to wake pt to administer 1600 meds. Called pts name and tapped pt on leg but pt continued to snore. Will hold meds until pt is awake. Dinner box set on floor next to bed.

## 2023-05-05 NOTE — ED Notes (Signed)
ivc/NP Arvilla Market refers pt for psychiatric inpatient admission.

## 2023-05-05 NOTE — ED Notes (Signed)
Reinforced appropriate boundaries of pt treatment area.  Hospital meal provided.  100% consumed, pt tolerated w/o complaints.  Waste discarded appropriately.

## 2023-05-05 NOTE — ED Notes (Signed)
IVC MOVED  TO  [facility]  ON  [facility]  WAITLIST

## 2023-05-06 NOTE — ED Notes (Signed)
This NT provided pt with pm snack.

## 2023-05-06 NOTE — ED Provider Notes (Signed)
Emergency Medicine Observation Re-evaluation Note  [name] [name] is a 27 y.o. male, [facility]  Physical Exam  BP 121/84 (BP Location: Right Arm)   Pulse (!) 103   Temp 98.2 F (36.8 C) (Oral)   Resp 20   Ht 6\' 1"  (1.854 m)   Wt 127 kg   SpO2 99%   BMI 36.94 kg/m  Physical Exam General: resting comfortably   ED Course / MDM   No new labs in past 24 hours  Plan  Current plan [facility].    Phineas Semen, MD 05/06/23 1228
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
## 2023-05-06 NOTE — ED Notes (Signed)
ivc/pending placement.Marland Kitchen
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
## 2023-05-06 NOTE — ED Notes (Signed)
IVC  [facility]  WAITLIST

## 2023-05-07 DIAGNOSIS — F203 Undifferentiated schizophrenia: Secondary | ICD-10-CM | POA: Diagnosis not present

## 2023-05-07 NOTE — ED Notes (Signed)
Pt taking shower. Pt was given hygiene items and the following, 1 clean top, 1 clean bottom, with 1 pair of disposable underwear.  Pt changed out into clean clothing.  Staff disposed of all shower supplies.   

## 2023-05-07 NOTE — ED Notes (Signed)
IVC/ PT [facility] WAITING LIST
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
## 2023-05-07 NOTE — ED Notes (Signed)
Patient provided snack at appropriate snack time.  Pt consumed 100% of snack provided, tolerated well w/o complaints

## 2023-05-07 NOTE — ED Notes (Signed)
Pt. Alert and oriented, warm and dry, in no distress during shift.    ENVIRONMENTAL ASSESSMENT Potentially harmful objects out of patient reach: Yes.   Personal belongings secured: Yes.   Patient dressed in hospital provided attire only: Yes.   Plastic bags out of patient reach: Yes.   Patient care equipment (cords, cables, call bells, lines, and drains) shortened, removed, or accounted for: Yes.   Equipment and supplies removed from bottom of stretcher: Yes.   Potentially toxic materials out of patient reach: Yes.   Sharps container removed or out of patient reach: Yes.

## 2023-05-07 NOTE — Progress Notes (Addendum)
[facility] MD Progress Note  05/07/2023 10:49 AM [name] [name]  MRN:  [id number]  Subjective:  Pt chart reviewed and seen at bedside. Spoke w/ RN who denies any significant events. Two security officers present for evaluation. Pt is calm, cooperative on assessment. He tells me he is "doing well". States his sleep has been "fine" and appetite has been "good". Reports he was recently in jail after trying to steal a beer. When asked about other substance use, pt denies substance use. No UDS available from this admission. Denies suicidal, homicidal ideations. Denies auditory visual hallucinations or paranoia. States he lives with his mother and brother but they are not picking up the phone. Discussed he is currently on [facility] priority waitlist for inpatient psychiatric hospitalization. Verbalized understanding.  Spoke w/ pt's ACT team, [facility], Dr. Mervyn Skeeters States ACT team was not informed pt was discharged from jail. Pt is not currently on a LAI because he has been refusing. Had previously been on Invega LAI with good response. States with each psychotic episode pt appears to be getting more aggressive. Requests update when pt is being discharged so they can follow up. Believes pt should be able to go back to his mother's home if discharged. We discussed that pt is currently on [facility] priority waitlist, although appears to be improving while taking his medications, and may be discharged once stabilized. She verbalized understanding.  Principal Problem: Schizophrenia, undifferentiated (HCC)  Diagnosis: Principal Problem:   Schizophrenia, undifferentiated (HCC)  Total Time spent with patient: 45 minutes  Past Psychiatric History: Schizophrenia vs schizoaffective disorder, bipolar type  Past Medical History:  Past Medical History:  Diagnosis Date   Aggressive behavior    Asthma    Depression    Psychosis (HCC)    Schizoaffective disorder (HCC)     Past Surgical History:  Procedure Laterality Date   BACK  SURGERY     I&D groin  2017   Family History: History reviewed. No pertinent family history. Family Psychiatric  History: None reported Social History:  Social History   Substance and Sexual Activity  Alcohol Use No     Social History   Substance and Sexual Activity  Drug Use Yes   Types: Marijuana, Methamphetamines, Cocaine   Comment: reports cannabis use as funds permit and use of crack cocaine and meth when able    Social History   Socioeconomic History   Marital status: Single    Spouse name: Not on file   Number of children: Not on file   Years of education: Not on file   Highest education level: Not on file  Occupational History   Not on file  Tobacco Use   Smoking status: Every Day    Current packs/day: 0.25    Types: Cigarettes   Smokeless tobacco: Never  Vaping Use   Vaping status: Some Days  Substance and Sexual Activity   Alcohol use: No   Drug use: Yes    Types: Marijuana, Methamphetamines, Cocaine    Comment: reports cannabis use as funds permit and use of crack cocaine and meth when able   Sexual activity: Not Currently    Partners: Female  Other Topics Concern   Not on file  Social History Narrative   Not on file   Social Determinants of Health   Financial Resource Strain: Not on file  Food Insecurity: No Food Insecurity (11/04/2022)   Hunger Vital Sign    Worried About Running Out of Food in the Last Year: Never true  Ran Out of Food in the Last Year: Never true  Transportation Needs: No Transportation Needs (11/04/2022)   PRAPARE - Administrator, Civil Service (Medical): No    Lack of Transportation (Non-Medical): No  Physical Activity: Not on file  Stress: Not on file  Social Connections: Not on file   Additional Social History:    Pain Medications: See PTA Prescriptions: See PTA Over the Counter: See PTA History of alcohol / drug use?: Yes Longest period of sobriety (when/how long): Unable to quantify Name of  Substance 1: Alcohol                  Sleep: Fair  Appetite:  Good  Current Medications: Current Facility-Administered Medications  Medication Dose Route Frequency Provider Last Rate Last Admin   amLODipine (NORVASC) tablet 10 mg  10 mg Oral Daily [name] E, NP   10 mg at 05/07/23 0946   chlorproMAZINE (THORAZINE) tablet 25 mg  25 mg Oral TID Chales Abrahams, NP   25 mg at 05/07/23 0946   divalproex (DEPAKOTE ER) 24 hr tablet 500 mg  500 mg Oral BID Phineas Semen, MD   500 mg at 05/07/23 0946   droperidol (INAPSINE) 2.5 MG/ML injection 5 mg  5 mg Intramuscular Once Chesley Noon, MD       multivitamin with minerals tablet 1 tablet  1 tablet Oral Daily Barrie Folk, [facility]   1 tablet at 05/07/23 5284   nicotine (NICODERM CQ - dosed in mg/24 hours) patch 21 mg  21 mg Transdermal Daily Phineas Semen, MD   21 mg at 05/07/23 0946   nicotine polacrilex (NICORETTE) gum 2 mg  2 mg Oral PRN Chales Abrahams, NP       OLANZapine zydis (ZYPREXA) disintegrating tablet 10 mg  10 mg Oral Q8H PRN [name] E, NP   10 mg at 05/07/23 0118   And   ziprasidone (GEODON) injection 20 mg  20 mg Intramuscular PRN Chales Abrahams, NP       propranolol (INDERAL) tablet 10 mg  10 mg Oral TID [name] E, NP   10 mg at 05/07/23 0946   traZODone (DESYREL) tablet 100 mg  100 mg Oral QHS Phineas Semen, MD   100 mg at 05/06/23 2101   Current Outpatient Medications  Medication Sig Dispense Refill   chlorproMAZINE (THORAZINE) 25 MG tablet Take 25 mg by mouth 3 (three) times daily.     cloZAPine (CLOZARIL) 100 MG tablet Take 150 mg by mouth 2 (two) times daily.     divalproex (DEPAKOTE ER) 500 MG 24 hr tablet Take 1 tablet (500 mg total) by mouth 2 (two) times daily. 60 tablet 1   glycopyrrolate (ROBINUL) 2 MG tablet Take 2 mg by mouth 2 (two) times daily.     Multiple Vitamin (MULTIVITAMIN ADULT PO) Take 1 tablet by mouth daily.     nicotine (NICODERM CQ - DOSED IN MG/24 HOURS) 21  mg/24hr patch Place 1 patch (21 mg total) onto the skin daily. 28 patch 1   nicotine polacrilex (NICORETTE) 2 MG gum Take 1 each (2 mg total) by mouth as needed for smoking cessation. 100 tablet 1   paliperidone (INVEGA SUSTENNA) 234 MG/1.5ML injection Inject 234 mg into the muscle every 28 (twenty-eight) days. 1.8 mL 1   traZODone (DESYREL) 100 MG tablet Take 1 tablet (100 mg total) by mouth at bedtime. 30 tablet 1   amLODipine (NORVASC) 10 MG tablet Take 1  tablet (10 mg total) by mouth daily. (Patient not taking: Reported on 03/27/2023) 30 tablet 1   clonazePAM (KLONOPIN) 0.5 MG tablet Take 1 tablet (0.5 mg total) by mouth 2 (two) times daily. (Patient not taking: Reported on 03/27/2023) 60 tablet 0   glycopyrrolate (ROBINUL) 1 MG tablet Take by mouth. (Patient not taking: Reported on 05/02/2023)     Glycopyrrolate 1.5 MG TABS Take 1 tablet by mouth 2 (two) times daily. (Patient not taking: Reported on 05/02/2023)     haloperidol (HALDOL) 5 MG tablet Take 1 tablet (5 mg total) by mouth at bedtime. (Patient not taking: Reported on 03/27/2023) 30 tablet 1   paliperidone (INVEGA) 3 MG 24 hr tablet Take 2 tablets (6 mg total) by mouth daily. (Patient not taking: Reported on 02/14/2023) 60 tablet 1   propranolol (INDERAL) 10 MG tablet Take 1 tablet (10 mg total) by mouth 3 (three) times daily. (Patient not taking: Reported on 03/27/2023) 90 tablet 1   temazepam (RESTORIL) 15 MG capsule Take 15 mg by mouth at bedtime. (Patient not taking: Reported on 05/02/2023)      Lab Results: No results found for this or any previous visit (from the past 48 hour(s)).  Blood Alcohol level:  Lab Results  Component Value Date   ETH <10 05/02/2023   ETH <10 03/26/2023    Metabolic Disorder Labs: Lab Results  Component Value Date   HGBA1C 4.8 11/06/2022   MPG 91.06 11/06/2022   MPG 99.67 12/03/2021   No results found for: "PROLACTIN" Lab Results  Component Value Date   CHOL 181 11/06/2022   TRIG 117 11/06/2022    HDL 49 11/06/2022   CHOLHDL 3.7 11/06/2022   VLDL 23 11/06/2022   LDLCALC 109 (H) 11/06/2022   LDLCALC 129 (H) 12/06/2021    Physical Findings: AIMS:  , ,  ,  ,    CIWA:    COWS:     Musculoskeletal: Strength & Muscle Tone: within normal limits Gait & Station: normal Patient leans: N/A  Psychiatric Specialty Exam:  Presentation  General Appearance:  Appropriate for Environment  Eye Contact: Fair  Speech: Clear and Coherent; Normal Rate  Speech Volume: Normal  Handedness: Right   Mood and Affect  Mood: -- ("doing well")  Affect: Blunt   Thought Process  Thought Processes: Coherent  Descriptions of Associations:Intact  Orientation:Partial  Thought Content:WDL  History of Schizophrenia/Schizoaffective disorder:Yes  Duration of Psychotic Symptoms:Greater than six months  Hallucinations:Hallucinations: None  Ideas of Reference:None  Suicidal Thoughts:Suicidal Thoughts: No  Homicidal Thoughts:Homicidal Thoughts: No   Sensorium  Memory: Immediate Fair  Judgment: Intact  Insight: Shallow   Executive Functions  Concentration: Fair  Attention Span: Fair  Recall: Fair  Fund of Knowledge: Fair  Language: Fair   Psychomotor Activity  Psychomotor Activity:Psychomotor Activity: Normal   Assets  Assets: Communication Skills; Desire for Improvement; Financial Resources/Insurance; Leisure Time; Physical Health; Resilience   Sleep  Sleep:Sleep: Good    Physical Exam: Physical Exam Constitutional:      General: He is not in acute distress.    Appearance: He is not ill-appearing, toxic-appearing or diaphoretic.  Eyes:     General: No scleral icterus. Cardiovascular:     Rate and Rhythm: Normal rate.  Pulmonary:     Effort: Pulmonary effort is normal. No respiratory distress.  Neurological:     Mental Status: He is alert.     Comments: Partially oriented.  Psychiatric:        Attention and Perception: Attention  and  perception normal.        Mood and Affect: Mood normal. Affect is blunt.        Speech: Speech normal.        Behavior: Behavior normal. Behavior is cooperative.        Thought Content: Thought content normal.    Review of Systems  Constitutional:  Negative for chills and fever.  Respiratory:  Negative for shortness of breath.   Cardiovascular:  Negative for chest pain and palpitations.  Gastrointestinal:  Negative for abdominal pain.  Neurological:  Negative for headaches.  Psychiatric/Behavioral: Negative.     Blood pressure 124/83, pulse 78, temperature 98 F (36.7 C), temperature source Oral, resp. rate 18, height 6\' 1"  (1.854 m), weight 127 kg, SpO2 98%. Body mass index is 36.94 kg/m.  Treatment Plan Summary: Daily contact with patient to assess and evaluate symptoms and progress in treatment, Medication management, and Plan    Continue current medications. PRNs are ordered for agitation. Have ordered depakote level to be drawn tomorrow. Pt currently on [facility] priority waitlist.  Lauree Chandler, NP 05/07/2023, 10:49 AM

## 2023-05-07 NOTE — ED Notes (Signed)
PT IVC/ [facility] WAITING LIST.

## 2023-05-07 NOTE — BH Assessment (Signed)
Writer received call from [facility], Dr. Greer Pickerel, psychologist who states patient does not have any pending or current charges against him and will not be returning to jail when discharged.

## 2023-05-07 NOTE — ED Notes (Signed)
Pt expressing restlessness, agitation, anxiousness, and restlessness, PRN offered. Pt banging on door intermittently due to frustration. Pt accepting of PRN.

## 2023-05-07 NOTE — ED Notes (Signed)
Hospital meal provided.  100% consumed, pt tolerated w/o complaints.  Waste discarded appropriately.   

## 2023-05-07 NOTE — ED Notes (Signed)
Patient provided snack at appropriate snack time.   ?

## 2023-05-07 NOTE — ED Provider Notes (Signed)
Emergency Medicine Observation Re-evaluation Note  [name] [name] is a 27 y.o. male, seen on rounds today.  Pt initially presented to the ED for complaints of No chief complaint on file.  Currently, the patient is no acute distress.asleep in bed  Physical Exam  Blood pressure 128/89, pulse 89, temperature 98.2 F (36.8 C), temperature source Oral, resp. rate 18, height 6\' 1"  (1.854 m), weight 127 kg, SpO2 100%.  Physical Exam General: No apparent distress Pulm: Normal WOB Psych: resting     ED Course / MDM     I have reviewed the labs performed to date as well as medications administered while in observation.  Recent changes in the last 24 hours include none   Plan   Current plan is to continue to wait for psych plan/placement if felt warranted  Patient is under full IVC at this time.   Concha Se, MD 05/07/23 [phone]

## 2023-05-07 NOTE — ED Notes (Addendum)
Hospital meal provided.  100% consumed, pt tolerated w/o complaints.  Waste discarded appropriately.   

## 2023-05-08 LAB — VALPROIC ACID LEVEL: Valproic Acid Lvl: 64 ug/mL (ref 50.0–100.0)

## 2023-05-08 NOTE — BH Assessment (Signed)
Confirmed with [facility] (Mr. [phone]), patient is on their Waitlist.

## 2023-05-08 NOTE — Consult Note (Signed)
  Patient noted lying in the bed asleep. Patient easily aroused. Patient willing to engage and inquiring about an estimated time for his transfer to [facility]. Patient calm during visit, but assigned RN reported that patient was agitated earlier during the shift, calling staff profane names. He is compliant with medications. No adverse medication effects. No need for medication adjustments at this time. Patient previously recommended for inpatient psychiatry by an alternate provider. Patient awaiting bed placement at this time.

## 2023-05-08 NOTE — ED Notes (Signed)
Dr. Rosalia Hammers in [place] to check on patient population. Pt seen on the security cameras going in his room and flicking the lights off and on. Pt pacing and calling out to staff. When asked, pt states he wants to shower. Pt reassured he would receive his shower supplies after he eats breakfast. Pt verbalized understanding.

## 2023-05-08 NOTE — ED Provider Notes (Signed)
Emergency Medicine Observation Re-evaluation Note  [name] [name] is a 27 y.o. male, seen on rounds today.  Pt initially presented to the ED for complaints of No chief complaint on file.  Currently, the patient is resting in bed. No reported issues overnight from nursing team.   Physical Exam  BP 119/75 (BP Location: Right Arm)   Pulse 85   Temp 98.3 F (36.8 C) (Oral)   Resp 18   Ht 6\' 1"  (1.854 m)   Wt 127 kg   SpO2 99%   BMI 36.94 kg/m  Physical Exam General: Resting in bed  ED Course / MDM   Therapeutic VPA level yesterday, no other labs last 24 hours.   Plan  Current plan is for dispo per psychiatry.    Trinna Post, MD 05/08/23 [phone]

## 2023-05-08 NOTE — ED Notes (Signed)
[name] care management team called this RN: wanted to do assessment- Primary RN Sue Lush recommended visit be postponed until tomorrow due to patients irritability this morning. Case manager reports this is okay and she will come by tomorrow.

## 2023-05-08 NOTE — ED Notes (Signed)
Pt provided with lunch tray. Pt sitting up in bed eating.

## 2023-05-08 NOTE — ED Notes (Signed)
IVC  [facility]  WAITLIST 

## 2023-05-08 NOTE — ED Notes (Signed)
Pt provided with medications at the bedside and VS obtained. Trash removed from room by security. Pt cooperative at this time. This nurse informed pt of his elevated blood pressure and pt loudly states that's normal. Pt then yelled "stop smiling in my face fake ass bitch" to this nurse. ED tech and nurse left room after medications were taken and prepared shower supplies for pt. No further escalation of behavior noted at this time by patient.

## 2023-05-08 NOTE — ED Notes (Signed)
Shower supplies and clean scrubs provided.

## 2023-05-08 NOTE — BH Assessment (Signed)
TTS spoke with the patient to complete an updated/reassessment. Patient states he is doing well. He's upset that he continue to be isolated from other patients. Patient further report his understanding of him being placed on [facility] wait list.

## 2023-05-08 NOTE — ED Notes (Signed)
IVC/[facility] waitlist

## 2023-05-08 NOTE — ED Notes (Signed)
Breakfast tray provided. 

## 2023-05-08 NOTE — ED Notes (Signed)
Dinner tray given to pt

## 2023-05-08 NOTE — ED Notes (Signed)

## 2023-05-08 NOTE — ED Notes (Signed)
Patient is becoming verbally aggressive toward Clinical research associate. Patient Field seismologist "[place], Whore"  Technical brewer for safety.

## 2023-05-08 NOTE — BH Assessment (Signed)
Confirmed with [facility] ([phone]), patient remains on their priority wait list.

## 2023-05-08 NOTE — ED Notes (Signed)
Pt to door knocking. Asking to take a shower and wanting soap. Pt told that this nurse was receiving report right now from night shift but he can shower after breakfast. Pt verbalized understanding but remians at door looking through glass and pacing in hallway of locked 3 bed area of BHU.

## 2023-05-09 DIAGNOSIS — F203 Undifferentiated schizophrenia: Secondary | ICD-10-CM | POA: Diagnosis not present

## 2023-05-09 MED ORDER — DIVALPROEX SODIUM ER 500 MG PO TB24
750.0000 mg | ORAL_TABLET | Freq: Two times a day (BID) | ORAL | Status: DC
  Administered 2023-05-09 – 2023-05-19 (×22): 750 mg via ORAL
  Filled 2023-05-09 (×23): qty 1

## 2023-05-09 NOTE — ED Provider Notes (Signed)
Emergency Medicine Observation Re-evaluation Note  [name] [name] is a 27 y.o. male, seen on rounds today.  Pt initially presented to the ED for complaints of No chief complaint on file. Currently, the patient is calm in NAD.  Physical Exam  BP 112/80 (BP Location: Right Arm)   Pulse 81   Temp 98.6 F (37 C) (Oral)   Resp 18   Ht 6\' 1"  (1.854 m)   Wt 127 kg   SpO2 100%   BMI 36.94 kg/m    ED Course / MDM  EKG:EKG Interpretation Date/Time:  Monday May 05 2023 10:21:20 EDT Ventricular Rate:  74 PR Interval:  134 QRS Duration:  96 QT Interval:  354 QTC Calculation: 392 R Axis:   47  Text Interpretation: Normal sinus rhythm Nonspecific T wave abnormality Abnormal ECG When compared with ECG of 14-Feb-2023 11:56, Non-specific change in ST segment in Anterior leads Nonspecific T wave abnormality now evident in Anterolateral leads Confirmed by UNCONFIRMED, DOCTOR ([id number]), editor Lonell Face [phone]) on 05/06/2023 8:23:24 AM  I have reviewed the labs performed to date as well as medications administered while in observation.  Recent changes in the last 24 hours include none.  Plan  Current plan is for TOC dispo.    Shaune Pollack, MD 05/09/23 1025

## 2023-05-09 NOTE — ED Notes (Signed)
Pt has took his shower
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
## 2023-05-09 NOTE — ED Notes (Signed)
Hospital meal provided, pt tolerated w/o complaints.  Waste discarded appropriately.  

## 2023-05-09 NOTE — BH Assessment (Signed)
Confirmed with [facility] [phone]), patient remains on their priority wait list.

## 2023-05-09 NOTE — ED Notes (Signed)
Patient singing loudly and making loud noises in secured area, but otherwise has had baseline behavior today.

## 2023-05-09 NOTE — ED Notes (Signed)
IVC/ [facility] Waitlist 

## 2023-05-09 NOTE — BH Assessment (Signed)
Writer spoke with the patient to complete an updated/reassessment. Patient denies SI/HI and AV/H. Patient was able to answer questions with some redirection. Patient would laugh at inappropriate times during the conversation.

## 2023-05-09 NOTE — ED Notes (Signed)
Pt Received his breakfast

## 2023-05-09 NOTE — Plan of Care (Signed)
[facility] Tonsillectomy/Adenoidectomy, Postoperative PEDS care plan entered in error.

## 2023-05-09 NOTE — ED Notes (Signed)
IVC needs to be renewed tonight   [facility] waitlist

## 2023-05-09 NOTE — ED Notes (Signed)
Pt given meal tray.

## 2023-05-09 NOTE — ED Notes (Signed)
Patient visited by Case Manager from [facility]. Patient was in good spirits, but seemed irritable when asked what his plan was upon discharge. Patient continued to state "I'm going to get an apartment with section 8". When CM voiced concern that he has no income to be able to file for section 8 and that patient's mother was concerned about patient's violent behavior recently, patient refused to talk further about it and kept telling CM "not to worry about it". Patient voiced that he refuses to consider discontinuing marijuana use. CM requested that [facility] be called if plan changes to discharge patient and voiced that she had concern for patient's safety and safety of those around him due to recent changes in his behavior.

## 2023-05-10 NOTE — ED Notes (Signed)
Pt requested to speak to an physiatrist and expressed that he is having "really negative thoughts" and that they are bothering him. RN informed

## 2023-05-10 NOTE — ED Provider Notes (Signed)
Emergency Medicine Observation Re-evaluation Note  [name] [name] is a 27 y.o. male, who is on [facility] wait list  Physical Exam  BP 131/87 (BP Location: Right Arm)   Pulse 76   Temp 98.2 F (36.8 C) (Oral)   Resp 18   Ht 6\' 1"  (1.854 m)   Wt 127 kg   SpO2 98%   BMI 36.94 kg/m  Physical Exam General: resting comfortably   ED Course / MDM  No new labs in past 24 hours  Plan  Current plan is maintain patient on [facility] wait list.    Phineas Semen, MD 05/10/23 [phone]

## 2023-05-10 NOTE — ED Notes (Signed)
Pt observed yelling loudly, when asked what he was doing he says he's doing a rap. Pt redirected to lower his voice due to other patients on unit.

## 2023-05-10 NOTE — ED Notes (Signed)
Pt pleasant on approach, wanting to shake security officer's hand, redirected to fist bump and pt stated he can't fist bump because "I'm in hell, this is hell". Pt agreed to vitals and meds without issues. Pt tells staff they can call him "tyberius" and when asked what that meant pt states because he can instill fear.  Pt hyperverbal but compliant with staff directions ati this time. Given fluids and told to drink the water and juice given d/t elevated HR. Pt verbalized understanding of this.

## 2023-05-10 NOTE — ED Notes (Signed)
IVC papers faxed to the Magistrate for completion and receipt of Custody orders, per the Magistrate as the E-Filing system is down.

## 2023-05-10 NOTE — ED Notes (Addendum)
Pt wanted to speak with writer, on approach pt states he cannot feel his penis and he wants to castrate himself. Pt informed he cannot do that here and pt redirected to a more appropriate conversation. Pt advised to think of something he can do to distract himself while here, offered puzzles, watching tv or journaling but he refused. Pt says he doesn't know why he can't leave here, educated that he is going to inpatient at [facility]. Pt then talked a bit with staff stating his mother is from [place] and that he just met his father last year for the first time. Pt later came back to the door requesting to speak with psychiatry stating he can't control his thoughts. Offered PRN Zyprexa which he agreed to take. Currently sitting on bed eating chips.

## 2023-05-10 NOTE — ED Notes (Signed)
IVC papers expired need renewed/ Rec Inpt admin when medically cleared
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
## 2023-05-10 NOTE — ED Notes (Signed)
Breakfast was given 

## 2023-05-10 NOTE — ED Notes (Signed)
Dinner tray provided to pt, upon providing tray this tech asked pt to step away from door, pt responded "treating me like a animal, F*ck you b*tch I never wanna see you again", this tech did not further engage with pt to avoid escalated agitation.

## 2023-05-11 MED ORDER — CHLORPROMAZINE HCL 50 MG PO TABS
50.0000 mg | ORAL_TABLET | Freq: Two times a day (BID) | ORAL | Status: DC
  Administered 2023-05-11 – 2023-05-19 (×16): 50 mg via ORAL
  Filled 2023-05-11 (×16): qty 1

## 2023-05-11 MED ORDER — PALIPERIDONE ER 3 MG PO TB24
9.0000 mg | ORAL_TABLET | Freq: Every day | ORAL | Status: DC
  Administered 2023-05-13 – 2023-05-20 (×8): 9 mg via ORAL
  Filled 2023-05-11 (×8): qty 3

## 2023-05-11 MED ORDER — PALIPERIDONE ER 3 MG PO TB24
3.0000 mg | ORAL_TABLET | Freq: Once | ORAL | Status: AC
  Administered 2023-05-11: 3 mg via ORAL
  Filled 2023-05-11: qty 1

## 2023-05-11 MED ORDER — CHLORPROMAZINE HCL 25 MG PO TABS
25.0000 mg | ORAL_TABLET | Freq: Every day | ORAL | Status: DC
  Administered 2023-05-11 – 2023-05-18 (×8): 25 mg via ORAL
  Filled 2023-05-11 (×9): qty 1

## 2023-05-11 MED ORDER — PALIPERIDONE ER 3 MG PO TB24
6.0000 mg | ORAL_TABLET | Freq: Once | ORAL | Status: AC
  Administered 2023-05-12: 6 mg via ORAL
  Filled 2023-05-11: qty 2
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
## 2023-05-11 NOTE — ED Provider Notes (Signed)
Emergency Medicine Observation Re-evaluation Note  [name] [name] is a 27 y.o. male, awaiting bed at [facility]  Physical Exam  BP (!) 143/93 (BP Location: Left Arm)   Pulse 77   Temp 97.6 F (36.4 C) (Oral)   Resp 18   Ht 6\' 1"  (1.854 m)   Wt 127 kg   SpO2 99%   BMI 36.94 kg/m  Physical Exam General: Resting comfortably  ED Course / MDM  No new labs in past 24 hours  Plan  Current plan is for dispo.    Phineas Semen, MD 05/11/23 1146

## 2023-05-11 NOTE — ED Notes (Signed)
Patient is IVC is pending placement  to [facility]

## 2023-05-11 NOTE — ED Notes (Signed)
Ivc / patient referred to [facility]  wait list

## 2023-05-12 NOTE — ED Notes (Signed)
IVC / Patient referred to [facility] wait list

## 2023-05-12 NOTE — ED Notes (Signed)
Patient requesting something to help him calm down and feeling agitated. Patient given PRN per order.

## 2023-05-12 NOTE — Progress Notes (Signed)
[facility] MD Progress Note  05/12/2023 4:34 PM [name] [name]  MRN:  [id number]  Subjective:  Pt chart reviewed and assessed face to face. On assessment today, reports he is "doing well". Denies suicidal, homicidal ideations. Denies auditory visual hallucinations or paranoia. Per chart review, has not required agitation medications since 05/11/23, when he received zyprexa 10mg  orally. Medication adjustments were made yesterday. Pt denies side effects or adverse effects. Today, I discussed with him again the invega sustenna LAI. Pt states he is willing to have the invega sustenna LAI. Pt currently being titrated up on invega. Plan on receiving invega sustenna first loading dose on 05/14/23. Pt continues to be disoriented to date. Believes it is November 2024. When offered re-orientation as May 12, 2023, pt becomes irritable and is adamant it is November because he believes he was released from jail in November.  Principal Problem: Schizophrenia, undifferentiated (HCC)  Diagnosis: Principal Problem:   Schizophrenia, undifferentiated (HCC)  Total Time spent with patient: 15 minutes  Past Psychiatric History:  "Per chart review, patient has a hx of Schizophrenia with multiple inpatient admissions. Last encounter was sept 4, 2024 when he was evaluated at ED and sent to [facility] for treatment. "  Past Medical History:  Past Medical History:  Diagnosis Date   Aggressive behavior    Asthma    Depression    Psychosis (HCC)    Schizoaffective disorder [facility])     Past Surgical History:  Procedure Laterality Date   BACK SURGERY     I&D groin  2017   Family History: History reviewed. No pertinent family history. Family Psychiatric  History: None reported Social History:  Social History   Substance and Sexual Activity  Alcohol Use No     Social History   Substance and Sexual Activity  Drug Use Yes   Types: Marijuana, Methamphetamines, Cocaine   Comment: reports cannabis use as  funds permit and use of crack cocaine and meth when able    Social History   Socioeconomic History   Marital status: Single    Spouse name: Not on file   Number of children: Not on file   Years of education: Not on file   Highest education level: Not on file  Occupational History   Not on file  Tobacco Use   Smoking status: Every Day    Current packs/day: 0.25    Types: Cigarettes   Smokeless tobacco: Never  Vaping Use   Vaping status: Some Days  Substance and Sexual Activity   Alcohol use: No   Drug use: Yes    Types: Marijuana, Methamphetamines, Cocaine    Comment: reports cannabis use as funds permit and use of crack cocaine and meth when able   Sexual activity: Not Currently    Partners: Female  Other Topics Concern   Not on file  Social History Narrative   Not on file   Social Determinants of Health   Financial Resource Strain: Not on file  Food Insecurity: No Food Insecurity (11/04/2022)   Hunger Vital Sign    Worried About Running Out of Food in the Last Year: Never true    Ran Out of Food in the Last Year: Never true  Transportation Needs: No Transportation Needs (11/04/2022)   PRAPARE - Administrator, Civil Service (Medical): No    Lack of Transportation (Non-Medical): No  Physical Activity: Not on file  Stress: Not on file  Social Connections: Not on file   Additional Social  History:    Pain Medications: See PTA Prescriptions: See PTA Over the Counter: See PTA History of alcohol / drug use?: Yes Longest period of sobriety (when/how long): Unable to quantify Name of Substance 1: Alcohol                  Sleep: Good  Appetite:  Good  Current Medications: Current Facility-Administered Medications  Medication Dose Route Frequency Provider Last Rate Last Admin   amLODipine (NORVASC) tablet 10 mg  10 mg Oral Daily [name] E, NP   10 mg at 05/12/23 0845   chlorproMAZINE (THORAZINE) tablet 25 mg  25 mg Oral Daily Charm Rings, NP   25 mg at 05/12/23 1522   chlorproMAZINE (THORAZINE) tablet 50 mg  50 mg Oral BID Charm Rings, NP   50 mg at 05/12/23 0845   divalproex (DEPAKOTE ER) 24 hr tablet 750 mg  750 mg Oral BID Charm Rings, NP   750 mg at 05/12/23 0845   multivitamin with minerals tablet 1 tablet  1 tablet Oral Daily Barrie Folk, [facility]   1 tablet at 05/12/23 0845   nicotine (NICODERM CQ - dosed in mg/24 hours) patch 21 mg  21 mg Transdermal Daily Phineas Semen, MD   21 mg at 05/11/23 1020   nicotine polacrilex (NICORETTE) gum 2 mg  2 mg Oral PRN Chales Abrahams, NP       OLANZapine zydis (ZYPREXA) disintegrating tablet 10 mg  10 mg Oral Q8H PRN [name] E, NP   10 mg at 05/11/23 1621   And   ziprasidone (GEODON) injection 20 mg  20 mg Intramuscular PRN Chales Abrahams, NP       Melene Muller ON 05/13/2023] paliperidone (INVEGA) 24 hr tablet 9 mg  9 mg Oral Daily Charm Rings, NP       propranolol (INDERAL) tablet 10 mg  10 mg Oral TID [name] E, NP   10 mg at 05/12/23 1521   traZODone (DESYREL) tablet 100 mg  100 mg Oral QHS Phineas Semen, MD   100 mg at 05/11/23 2113   Current Outpatient Medications  Medication Sig Dispense Refill   chlorproMAZINE (THORAZINE) 25 MG tablet Take 25 mg by mouth 3 (three) times daily.     cloZAPine (CLOZARIL) 100 MG tablet Take 150 mg by mouth 2 (two) times daily.     divalproex (DEPAKOTE ER) 500 MG 24 hr tablet Take 1 tablet (500 mg total) by mouth 2 (two) times daily. 60 tablet 1   glycopyrrolate (ROBINUL) 2 MG tablet Take 2 mg by mouth 2 (two) times daily.     Multiple Vitamin (MULTIVITAMIN ADULT PO) Take 1 tablet by mouth daily.     nicotine (NICODERM CQ - DOSED IN MG/24 HOURS) 21 mg/24hr patch Place 1 patch (21 mg total) onto the skin daily. 28 patch 1   nicotine polacrilex (NICORETTE) 2 MG gum Take 1 each (2 mg total) by mouth as needed for smoking cessation. 100 tablet 1   paliperidone (INVEGA SUSTENNA) 234 MG/1.5ML injection Inject 234 mg into  the muscle every 28 (twenty-eight) days. 1.8 mL 1   traZODone (DESYREL) 100 MG tablet Take 1 tablet (100 mg total) by mouth at bedtime. 30 tablet 1   amLODipine (NORVASC) 10 MG tablet Take 1 tablet (10 mg total) by mouth daily. (Patient not taking: Reported on 03/27/2023) 30 tablet 1   clonazePAM (KLONOPIN) 0.5 MG tablet Take 1 tablet (0.5 mg total) by mouth  2 (two) times daily. (Patient not taking: Reported on 03/27/2023) 60 tablet 0   glycopyrrolate (ROBINUL) 1 MG tablet Take by mouth. (Patient not taking: Reported on 05/02/2023)     Glycopyrrolate 1.5 MG TABS Take 1 tablet by mouth 2 (two) times daily. (Patient not taking: Reported on 05/02/2023)     haloperidol (HALDOL) 5 MG tablet Take 1 tablet (5 mg total) by mouth at bedtime. (Patient not taking: Reported on 03/27/2023) 30 tablet 1   paliperidone (INVEGA) 3 MG 24 hr tablet Take 2 tablets (6 mg total) by mouth daily. (Patient not taking: Reported on 02/14/2023) 60 tablet 1   propranolol (INDERAL) 10 MG tablet Take 1 tablet (10 mg total) by mouth 3 (three) times daily. (Patient not taking: Reported on 03/27/2023) 90 tablet 1   temazepam (RESTORIL) 15 MG capsule Take 15 mg by mouth at bedtime. (Patient not taking: Reported on 05/02/2023)      Lab Results: No results found for this or any previous visit (from the past 48 hour(s)).  Blood Alcohol level:  Lab Results  Component Value Date   ETH <10 05/02/2023   ETH <10 03/26/2023    Metabolic Disorder Labs: Lab Results  Component Value Date   HGBA1C 4.8 11/06/2022   MPG 91.06 11/06/2022   MPG 99.67 12/03/2021   No results found for: "PROLACTIN" Lab Results  Component Value Date   CHOL 181 11/06/2022   TRIG 117 11/06/2022   HDL 49 11/06/2022   CHOLHDL 3.7 11/06/2022   VLDL 23 11/06/2022   LDLCALC 109 (H) 11/06/2022   LDLCALC 129 (H) 12/06/2021    Physical Findings: AIMS:  , ,  ,  ,    CIWA:    COWS:     Musculoskeletal: Strength & Muscle Tone: within normal limits Gait &  Station: normal Patient leans: N/A  Psychiatric Specialty Exam:  Presentation  General Appearance:  Appropriate for Environment  Eye Contact: Fair  Speech: Clear and Coherent; Normal Rate  Speech Volume: Normal  Handedness: Right   Mood and Affect  Mood: -- ("doing well")  Affect: Flat   Thought Process  Thought Processes: Coherent  Descriptions of Associations:Intact  Orientation:Partial (disoriented to month, oriented to year, pt states it is November 2024, becomes irritable when told it is May 12, 2023)  Thought Content:WDL  History of Schizophrenia/Schizoaffective disorder:Yes  Duration of Psychotic Symptoms:Greater than six months  Hallucinations:Hallucinations: None  Ideas of Reference:None  Suicidal Thoughts:Suicidal Thoughts: No  Homicidal Thoughts:Homicidal Thoughts: No   Sensorium  Memory: Immediate Fair  Judgment: Intact  Insight: Shallow   Executive Functions  Concentration: Fair  Attention Span: Fair  Recall: [employer] of Knowledge: Fair  Language: Fair   Psychomotor Activity  Psychomotor Activity: Psychomotor Activity: Normal   Assets  Assets: Communication Skills; Desire for Improvement; Financial Resources/Insurance; Leisure Time; Physical Health; Resilience   Sleep  Sleep: Sleep: Good    Physical Exam: Physical Exam Constitutional:      General: He is not in acute distress.    Appearance: He is not ill-appearing, toxic-appearing or diaphoretic.  Eyes:     General: No scleral icterus. Cardiovascular:     Rate and Rhythm: Normal rate.  Pulmonary:     Effort: Pulmonary effort is normal. No respiratory distress.  Neurological:     Mental Status: He is alert.     Comments: Partially oriented  Psychiatric:        Attention and Perception: Attention and perception normal.  Mood and Affect: Mood normal. Affect is flat.        Speech: Speech normal.        Behavior: Behavior normal.  Behavior is cooperative.        Thought Content: Thought content normal.    Review of Systems  Constitutional:  Negative for chills and fever.  Respiratory:  Negative for shortness of breath.   Cardiovascular:  Negative for chest pain and palpitations.  Gastrointestinal:  Negative for abdominal pain.  Neurological:  Negative for headaches.  Psychiatric/Behavioral: Negative.     Blood pressure (!) 134/91, pulse 78, temperature 97.7 F (36.5 C), temperature source Oral, resp. rate 18, height 6\' 1"  (1.854 m), weight 127 kg, SpO2 99%. Body mass index is 36.94 kg/m.   Treatment Plan Summary: Daily contact with patient to assess and evaluate symptoms and progress in treatment, Medication management, and Plan    Hypertension Norvasc 10mg  oral daily   Schizoaffective disorder, Bipolar Type: Continue Thorazine 25mg  oral daily Continue Thorazine 50mg  oral 2 times daily Continue Depakote 750mg  oral 2 times daily - will need repeat depakote level Pt received invega 6mg  oral today, scheduled for 9mg  oral tomorrow Pt agreeing to invega sustenna, plan for receiving on 05/14/23  Sleep:  Trazodone 100 mg oral at bedtime    Tobacco Use Disorder: Nicoderm 21mg  transdermal administer over 24 hours daily Nicorette 2mg  oral as needed smoking cessation  Agitation medication Zyprexa 10mg  oral every 8 hours PRN agitation Geodon 20mg  IM as needed agitation for 1 dose  Anxiety Propanolol 10mg  oral 3 times daily  Insomnia  Trazodone 100mg  oral daily at bedtime  Nutritional supplement Multivitamin with minerals 1 tablet oral daily   Treatment Plan Summary: - Patient with hx for schizophrenia and medication non-adherence; reassessed by psychiatry today.   - Daily contact with patient to assess and evaluate symptoms and progress in treatment and Medication management   Disposition:  Patient has been referred to [facility] priority wait list.  Lauree Chandler, NP 05/12/2023, 4:34 PM

## 2023-05-12 NOTE — ED Notes (Signed)
Dinner tray given

## 2023-05-12 NOTE — ED Notes (Signed)
Patient pacing around in unit at this time.

## 2023-05-12 NOTE — ED Notes (Signed)
Patient allowed this RN and NT Felecia to clean room while vitals were being obtained.

## 2023-05-12 NOTE — ED Notes (Signed)
This NT provided pt with pm snack. 

## 2023-05-12 NOTE — ED Notes (Signed)
Ivc / patient referred to [facility]  wait list

## 2023-05-13 DIAGNOSIS — F203 Undifferentiated schizophrenia: Secondary | ICD-10-CM | POA: Diagnosis not present

## 2023-05-13 MED ORDER — LORAZEPAM 2 MG PO TABS
2.0000 mg | ORAL_TABLET | Freq: Once | ORAL | Status: AC
  Administered 2023-05-13: 2 mg via ORAL
  Filled 2023-05-13: qty 1

## 2023-05-13 MED ORDER — PALIPERIDONE PALMITATE ER 234 MG/1.5ML IM SUSY
234.0000 mg | PREFILLED_SYRINGE | Freq: Once | INTRAMUSCULAR | Status: AC
  Administered 2023-05-14: 234 mg via INTRAMUSCULAR
  Filled 2023-05-13: qty 1.5

## 2023-05-13 NOTE — ED Notes (Signed)
Pt given PM snack at this time.  

## 2023-05-13 NOTE — ED Notes (Signed)
[name] [name], from [facility], reports patient remains on priority admission list.

## 2023-05-13 NOTE — ED Notes (Signed)
Patient in locked area standing on bed frame punching walls and singing very loudly.  Writer went into unit with security staff to ask to lower voice. Patient continues to sing loudly and pace in unit and punch walls.

## 2023-05-13 NOTE — ED Notes (Signed)
IVC  [facility]  WAITLIST 

## 2023-05-13 NOTE — ED Notes (Signed)
PT IVC/PENDING REFERRAL TO [facility] WAITING LIST.

## 2023-05-13 NOTE — ED Notes (Signed)
Pt did not participate in nursing assessment; however he has been calm and cooperative throughout this shift. He has been compliant with medication.
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
## 2023-05-13 NOTE — ED Notes (Signed)
Order received for PO ativan 2mg .  Patient took with no issues and then sat on bed for 15-20 mins. Since then patient is up pacing locked area and singing and screaming.  Will continue to assess for effectiveness of ativan.

## 2023-05-13 NOTE — ED Provider Notes (Signed)
Emergency Medicine Observation Re-evaluation Note  [name] [name] is a 27 y.o. male, awaiting dispo  Physical Exam  BP (!) 119/95 (BP Location: Left Arm)   Pulse 97   Temp 98.7 F (37.1 C) (Oral)   Resp 18   Ht 6\' 1"  (1.854 m)   Wt 127 kg   SpO2 100%   BMI 36.94 kg/m  Physical Exam General: resting comfortably   ED Course / MDM  No new results in past 24 hours  Plan  Current plan is for dispo.    Phineas Semen, MD 05/13/23 [phone]

## 2023-05-14 NOTE — ED Notes (Signed)
Breakfast provided.

## 2023-05-14 NOTE — ED Notes (Signed)
IVC  [facility]  WAITLIST 

## 2023-05-14 NOTE — ED Notes (Signed)
Patient took HS PO medications with no issues. Patient currently pacing in unit. Singing loudly.

## 2023-05-14 NOTE — ED Notes (Signed)
Patient given dinner tray.

## 2023-05-14 NOTE — ED Notes (Signed)
Pt is banging on the glass with his hands and head.

## 2023-05-14 NOTE — ED Notes (Signed)
Patient standing in room 7 at window with ear pressed trying to hear in nurses station.

## 2023-05-14 NOTE — BH Assessment (Addendum)
Confirmed with [facility] ([phone]), patient remains on their priority wait list.

## 2023-05-14 NOTE — ED Provider Notes (Signed)
Emergency Medicine Observation Re-evaluation Note  [name] [name] is a 27 y.o. male, seen on rounds today.  Pt initially presented to the ED for complaints of No chief complaint on file.    Physical Exam  BP (!) 130/97 (BP Location: Right Arm)   Pulse (!) 114   Temp 98.9 F (37.2 C) (Oral)   Resp 18   Ht 6\' 1"  (1.854 m)   Wt 127 kg   SpO2 99%   BMI 36.94 kg/m  Physical Exam General: NAD  ED Course / MDM  EKG:EKG Interpretation Date/Time:  Monday May 05 2023 10:21:20 EDT Ventricular Rate:  74 PR Interval:  134 QRS Duration:  96 QT Interval:  354 QTC Calculation: 392 R Axis:   47  Text Interpretation: Normal sinus rhythm Nonspecific T wave abnormality Abnormal ECG When compared with ECG of 14-Feb-2023 11:56, Non-specific change in ST segment in Anterior leads Nonspecific T wave abnormality now evident in Anterolateral leads Confirmed by UNCONFIRMED, DOCTOR ([id number]), editor Lonell Face [phone]) on 05/06/2023 8:23:24 AM  I have reviewed the labs performed to date as well as medications administered while in observation.     Plan  Current plan is for psych/soc    Willy Eddy, MD 05/14/23 [phone]

## 2023-05-15 DIAGNOSIS — F203 Undifferentiated schizophrenia: Secondary | ICD-10-CM | POA: Diagnosis not present

## 2023-05-15 NOTE — ED Notes (Signed)
Dinner tray provided
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
## 2023-05-15 NOTE — ED Notes (Signed)
Breakfast tray and juice and milk provided by ED tech Felicia.

## 2023-05-15 NOTE — ED Notes (Signed)
Patient pacing in locked unit and jumping on bed platforms in rooms.

## 2023-05-15 NOTE — BH Assessment (Signed)
Confirmed with [facility] ([phone]), patient remains on their priority wait list.

## 2023-05-15 NOTE — ED Notes (Signed)
[facility] waitlist 

## 2023-05-15 NOTE — BH Assessment (Signed)
Writer spoke with the patient to complete an updated/reassessment. Patient denies SI/HI and AV/H. 

## 2023-05-15 NOTE — ED Notes (Signed)
Pt given shower supplies by ED tech Felicia. Water turned on by security. Pt appears calm at this time.

## 2023-05-16 MED ORDER — CALCIUM CARBONATE ANTACID 500 MG PO CHEW
1.0000 | CHEWABLE_TABLET | Freq: Once | ORAL | Status: DC
  Filled 2023-05-16: qty 1

## 2023-05-16 NOTE — BH Assessment (Signed)
Confirmed with [facility] (Mr. [phone]), patient is on their Waitlist.  °

## 2023-05-16 NOTE — ED Notes (Addendum)
Patient not aggressive at this time is now calm and cooperative, is unwilling to allow blood draw at this time but stated he would take 1600 medications when available from pharmacy.

## 2023-05-16 NOTE — ED Notes (Signed)
Patient is remaining calm and He took His 2 pm medication without issues, no behavioral issues noted. Patient complaining of acid reflux, notified MD.

## 2023-05-16 NOTE — ED Notes (Signed)
Patient refused lab draw at this time.

## 2023-05-16 NOTE — ED Notes (Signed)
PT IVC/ STILL ON [facility] WAITLIST

## 2023-05-16 NOTE — ED Notes (Signed)
Patient provided snack at this time

## 2023-05-16 NOTE — ED Notes (Signed)
Patient requesting to shower previous nurse told patient he could shower when he wanted. Patient given supplies to shower at this time.

## 2023-05-16 NOTE — ED Notes (Signed)
Patient given dinner tray at this time.

## 2023-05-16 NOTE — BH Assessment (Signed)
Confirmed with [facility] ([phone]), patient is on their Waitlist.

## 2023-05-16 NOTE — ED Notes (Signed)
Patient is smiling, no behavioral issues noted at this time, he took po medications without hesitation, He was polite and said " Thank you". STAFF WILL continue to monitor for safety. Patient remains in the locked , secure unit due to unpredictability, security guards monitoring , camera surveillance in progress for safety.

## 2023-05-16 NOTE — ED Provider Notes (Signed)
Emergency Medicine Observation Re-evaluation Note  [name] [name] is a 27 y.o. male, seen on rounds today.  Pt initially presented to the ED for complaints of No chief complaint on file.  Currently, the patient is resting comfortably.  Physical Exam  BP (!) 131/90 (BP Location: Right Arm)   Pulse 87   Temp 97.9 F (36.6 C) (Oral)   Resp 18   Ht 6\' 1"  (1.854 m)   Wt 127 kg   SpO2 98%   BMI 36.94 kg/m  General: No acute distress Cardiac: Well-perfused extremities Lungs: No respiratory distress Psych: Appropriate mood and affect  ED Course / MDM  EKG:EKG Interpretation Date/Time:  Monday May 05 2023 10:21:20 EDT Ventricular Rate:  74 PR Interval:  134 QRS Duration:  96 QT Interval:  354 QTC Calculation: 392 R Axis:   47  Text Interpretation: Normal sinus rhythm Nonspecific T wave abnormality Abnormal ECG When compared with ECG of 14-Feb-2023 11:56, Non-specific change in ST segment in Anterior leads Nonspecific T wave abnormality now evident in Anterolateral leads Confirmed by UNCONFIRMED, DOCTOR ([id number]), editor Lonell Face [phone]) on 05/06/2023 8:23:24 AM  I have reviewed the labs performed to date as well as medications administered while in observation.  Recent changes in the last 24 hours include none.  Plan  Current plan is for placement.   Merwyn Katos, MD 05/16/23 [phone]

## 2023-05-16 NOTE — ED Notes (Signed)
IVC/ [facility] Waitlist 
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
## 2023-05-17 DIAGNOSIS — F203 Undifferentiated schizophrenia: Secondary | ICD-10-CM | POA: Diagnosis not present

## 2023-05-17 LAB — COMPREHENSIVE METABOLIC PANEL
ALT: 29 U/L (ref 0–44)
AST: 19 U/L (ref 15–41)
Albumin: 3.9 g/dL (ref 3.5–5.0)
Alkaline Phosphatase: 80 U/L (ref 38–126)
Anion gap: 6 (ref 5–15)
BUN: 12 mg/dL (ref 6–20)
CO2: 27 mmol/L (ref 22–32)
Calcium: 8.5 mg/dL — ABNORMAL LOW (ref 8.9–10.3)
Chloride: 103 mmol/L (ref 98–111)
Creatinine, Ser: 0.86 mg/dL (ref 0.61–1.24)
GFR, Estimated: >60 mL/min (ref >60)
Glucose, Bld: 132 mg/dL — ABNORMAL HIGH (ref 70–99)
Potassium: 3.6 mmol/L (ref 3.5–5.1)
Sodium: 136 mmol/L (ref 135–145)
Total Bilirubin: 0.7 mg/dL (ref 0.3–1.2)
Total Protein: 6.5 g/dL (ref 6.5–8.1)

## 2023-05-17 LAB — CBC
HCT: 40.9 % (ref 39.0–52.0)
Hemoglobin: 14.2 g/dL (ref 13.0–17.0)
MCH: 28.5 pg (ref 26.0–34.0)
MCHC: 34.7 g/dL (ref 30.0–36.0)
MCV: 82.1 fL (ref 80.0–100.0)
Platelets: 245 10*3/uL (ref 150–400)
RBC: 4.98 MIL/uL (ref 4.22–5.81)
RDW: 12.3 % (ref 11.5–15.5)
WBC: 5 10*3/uL (ref 4.0–10.5)
nRBC: 0 % (ref 0.0–0.2)

## 2023-05-17 LAB — VALPROIC ACID LEVEL: Valproic Acid Lvl: 87 ug/mL (ref 50.0–100.0)

## 2023-05-17 NOTE — ED Notes (Signed)
Blood sent to lab at this time.

## 2023-05-17 NOTE — BH Assessment (Signed)
Confirmed with [facility] ([phone]), patient is on their Waitlist.  
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
## 2023-05-17 NOTE — ED Notes (Signed)
pt recieved snack and drink 

## 2023-05-17 NOTE — BH Assessment (Signed)
Writer spoke with the patient to complete an updated/reassessment. Patient denies SI/HI and AV/H. Patient compliant with medications, and was pleasant during the interview.

## 2023-05-17 NOTE — ED Provider Notes (Signed)
Emergency Medicine Observation Re-evaluation Note  Norwood A Haq is a 27 y.o. male, seen on rounds today.  Pt initially presented to the ED for complaints of No chief complaint on file.  Currently, the patient is no acute distress. Resting in bed   Physical Exam  Blood pressure 119/80, pulse 88, temperature 97.8 F (36.6 C), temperature source Oral, resp. rate 16, height 6\' 1"  (1.854 m), weight 127 kg, SpO2 97%.  Physical Exam General: No apparent distress Pulm: Normal WOB Psych: resting     ED Course / MDM     I have reviewed the labs performed to date as well as medications administered while in observation.  Recent changes in the last 24 hours include none   Plan   Current plan is to continue to wait for placement Patient is under full IVC at this time.   Concha Se, MD 05/17/23 [phone]

## 2023-05-17 NOTE — ED Notes (Signed)
Patient is calm and cooperative, no signs of distress, took all of His po medications this morning, ate 100% of breakfast and beverage. Staff will continue to monitor for safety.

## 2023-05-17 NOTE — BH Assessment (Signed)
Confirmed with [facility] ([phone]), patient is on their Waitlist.

## 2023-05-17 NOTE — ED Notes (Signed)
ivc/[facility] Waitlist.

## 2023-05-17 NOTE — ED Notes (Signed)
Nurse attempted to draw blood and to get EKG, but patient refuses.

## 2023-05-18 NOTE — ED Notes (Signed)
Meal tray provided to pt.

## 2023-05-18 NOTE — ED Notes (Signed)

## 2023-05-18 NOTE — ED Provider Notes (Signed)
Emergency Medicine Observation Re-evaluation Note  Physical Exam   BP 136/87   Pulse 79   Temp 98.3 F (36.8 C) (Oral)   Resp 17   Ht 6\' 1"  (1.854 m)   Wt 127 kg   SpO2 98%   BMI 36.94 kg/m   Patient appears in no acute distress.  ED Course / MDM   No reported events during my shift at the time of this note.   Pt is awaiting dispo from consultants   Pilar Jarvis MD    Pilar Jarvis, MD 05/18/23 [phone]

## 2023-05-18 NOTE — ED Notes (Signed)
Pt provided with breakfast tray. Pt sitting up eating

## 2023-05-18 NOTE — ED Notes (Signed)
IVC Renewed/Still on [facility] Waitlist

## 2023-05-18 NOTE — ED Notes (Signed)
Pt took shower. Pt provided with shower supplies and clean clothes.

## 2023-05-18 NOTE — ED Notes (Addendum)
Patient banged on window in room 7. Medical sales representative and ED NT went to see what patient needed.  When security opened door patient started coming out of unit and coming toward this Clinical research associate cussing. Writer directed ED NT to go into nurses station. Security was able to calm patient with verbal de-escalation.

## 2023-05-18 NOTE — ED Notes (Signed)
Pt had outburst of banging on window. When asked what was wrong pt yelled and cussed at staff angrily while lunging forward. Security officer was able to verbally de-escalate pt. Back-up security contacted

## 2023-05-18 NOTE — BH Assessment (Signed)
Confirmed with [facility] ([phone]), patient is on their Waitlist.  

## 2023-05-18 NOTE — ED Notes (Signed)
Patient is IVC pending [facility] wait list

## 2023-05-19 DIAGNOSIS — F203 Undifferentiated schizophrenia: Secondary | ICD-10-CM | POA: Diagnosis not present

## 2023-05-19 MED ORDER — HALOPERIDOL 5 MG PO TABS
5.0000 mg | ORAL_TABLET | Freq: Once | ORAL | Status: AC | PRN
  Administered 2023-05-21: 5 mg via ORAL
  Filled 2023-05-19: qty 1

## 2023-05-19 MED ORDER — DIPHENHYDRAMINE HCL 50 MG/ML IJ SOLN: 50.0000 mg | Freq: Once | INTRAMUSCULAR | Status: AC | PRN

## 2023-05-19 MED ORDER — LORAZEPAM 2 MG PO TABS
2.0000 mg | ORAL_TABLET | Freq: Once | ORAL | Status: AC | PRN
  Administered 2023-05-21: 2 mg via ORAL
  Filled 2023-05-19: qty 1

## 2023-05-19 MED ORDER — CHLORPROMAZINE HCL 50 MG PO TABS
50.0000 mg | ORAL_TABLET | Freq: Three times a day (TID) | ORAL | Status: DC
  Administered 2023-05-19: 25 mg via ORAL
  Administered 2023-05-19 – 2023-05-28 (×26): 50 mg via ORAL
  Filled 2023-05-19 (×30): qty 1

## 2023-05-19 MED ORDER — DIPHENHYDRAMINE HCL 25 MG PO CAPS
50.0000 mg | ORAL_CAPSULE | Freq: Once | ORAL | Status: AC | PRN
  Administered 2023-05-21: 50 mg via ORAL
  Filled 2023-05-19: qty 2

## 2023-05-19 MED ORDER — HALOPERIDOL LACTATE 5 MG/ML IJ SOLN: 5.0000 mg | Freq: Once | INTRAMUSCULAR | Status: AC | PRN

## 2023-05-19 MED ORDER — LORAZEPAM 2 MG/ML IJ SOLN: 2.0000 mg | Freq: Once | INTRAMUSCULAR | Status: AC | PRN

## 2023-05-19 NOTE — ED Notes (Signed)
PT IVC ON [place] WAITING LIST

## 2023-05-19 NOTE — ED Notes (Signed)
Writer went to give patient HS medications. Patient was in shower.  Patient directed to get out of shower and get dressed.  Writer cut water off to shower. Patient got dressed and took HS medications.

## 2023-05-19 NOTE — ED Notes (Signed)
IVC  [facility]  WAITLIST 

## 2023-05-19 NOTE — ED Notes (Signed)
Patient sitting in bathroom floor crying.

## 2023-05-19 NOTE — Consult Note (Signed)
[facility] Face-to-Face Psychiatry Consult   Reason for Consult:  Admit Referring Physician:  Dr. Concha Se Patient Identification: [name] [name] MRN:  [id number] Principal Diagnosis: Schizophrenia, undifferentiated (HCC) Diagnosis:  Principal Problem:   Schizophrenia, undifferentiated (HCC)  Total Time spent with patient, including chart review: 30 minutes  HPI:   Pt chart reviewed and assessed face to face. Per chart review, last night pt was noted to be agitated. Per notes, banging on window, yelling and cursing at staff, lunging, sitting on the bathroom floor crying. Per review of MAR, pt received zyprexa 10mg  orally for agitation last night 05/18/23 2100.  On assessment today, pt reports mood is "good". Discussed next loading dose of Invega due on Wednesday. He verbalized understanding and agreed to receiving it. Pt appears more disorganized today compared to prior assessments with me. When discussing where he will live if discharged, pt states he would live with his mother. He then states he does not have a mother. States his mother is not his mother because of Freud. 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 Can't remember my mom. Not able to have sex. Have traumatic experiences". Pt reports he is supposed to discharge today. Discussed discharge is not planned for today and we are continuing to work on his medications. Pt states he will get angry if he is not discharged today.   Past Psychiatric History: "Per chart review, patient has a hx of Schizophrenia with multiple inpatient admissions. Last encounter was sept 4, 2024 when he was evaluated at ED and sent to [facility] for treatment. "  Risk to Self: Denies suicidal ideations Risk to Others: Denies homicidal ideations. Continues to have periods of agitation, most recently on 05/18/23 Prior Inpatient Therapy: Yes Prior Outpatient Therapy: Yes, connected with [facility] team  Past Medical History:  Past Medical  History:  Diagnosis Date   Aggressive behavior    Asthma    Depression    Psychosis (HCC)    Schizoaffective disorder (HCC)     Past Surgical History:  Procedure Laterality Date   BACK SURGERY     I&D groin  2017   Family History: History reviewed. No pertinent family history. Family Psychiatric  History: None reported Social History:  Social History   Substance and Sexual Activity  Alcohol Use No     Social History   Substance and Sexual Activity  Drug Use Yes   Types: Marijuana, Methamphetamines, Cocaine   Comment: reports cannabis use as funds permit and use of crack cocaine and meth when able    Social History   Socioeconomic History   Marital status: Single    Spouse name: Not on file   Number of children: Not on file   Years of education: Not on file   Highest education level: Not on file  Occupational History   Not on file  Tobacco Use   Smoking status: Every Day    Current packs/day: 0.25    Types: Cigarettes   Smokeless tobacco: Never  Vaping Use   Vaping status: Some Days  Substance and Sexual Activity   Alcohol use: No   Drug use: Yes    Types: Marijuana, Methamphetamines, Cocaine    Comment: reports cannabis use as funds permit and use of crack cocaine and meth when able   Sexual activity: Not Currently    Partners: Female  Other Topics Concern   Not on file  Social History Narrative   Not on file   Social  Determinants of Health   Financial Resource Strain: Not on file  Food Insecurity: No Food Insecurity (11/04/2022)   Hunger Vital Sign    Worried About Running Out of Food in the Last Year: Never true    Ran Out of Food in the Last Year: Never true  Transportation Needs: No Transportation Needs (11/04/2022)   PRAPARE - Administrator, Civil Service (Medical): No    Lack of Transportation (Non-Medical): No  Physical Activity: Not on file  Stress: Not on file  Social Connections: Not on file    Allergies:  No Known  Allergies  Labs:  Results for orders placed or performed during the hospital encounter of 05/02/23 (from the past 48 hour(s))  Valproic acid level     Status: None   Collection Time: 05/17/23  5:08 PM  Result Value Ref Range   Valproic Acid Lvl 87 50.0 - 100.0 ug/mL    Comment: Performed at [facility], [name] [place], [place] [address]  CBC     Status: None   Collection Time: 05/17/23  5:08 PM  Result Value Ref Range   WBC 5.0 4.0 - 10.5 K/uL   RBC 4.98 4.22 - 5.81 MIL/uL   Hemoglobin 14.2 13.0 - 17.0 g/dL   HCT 55.7 32.2 - 02.5 %   MCV 82.1 80.0 - 100.0 fL   MCH 28.5 26.0 - 34.0 pg   MCHC 34.7 30.0 - 36.0 g/dL   RDW 42.7 06.2 - 37.6 %   Platelets 245 150 - 400 K/uL   nRBC 0.0 0.0 - 0.2 %    Comment: Performed at [facility], [address] [place], [place] [address]  Comprehensive metabolic panel     Status: Abnormal   Collection Time: 05/17/23  5:08 PM  Result Value Ref Range   Sodium 136 135 - 145 mmol/L   Potassium 3.6 3.5 - 5.1 mmol/L   Chloride 103 98 - 111 mmol/L   CO2 27 22 - 32 mmol/L   Glucose, Bld 132 (H) 70 - 99 mg/dL    Comment: Glucose reference range applies only to samples taken after fasting for at least 8 hours.   BUN 12 6 - 20 mg/dL   Creatinine, Ser 1.76 0.61 - 1.24 mg/dL   Calcium 8.5 (L) 8.9 - 10.3 mg/dL   Total Protein 6.5 6.5 - 8.1 g/dL   Albumin 3.9 3.5 - 5.0 g/dL   AST 19 15 - 41 U/L   ALT 29 0 - 44 U/L   Alkaline Phosphatase 80 38 - 126 U/L   Total Bilirubin 0.7 0.3 - 1.2 mg/dL   GFR, Estimated >16 >07 mL/min    Comment: (NOTE) Calculated using the CKD-EPI Creatinine Equation (2021)    Anion gap 6 5 - 15    Comment: Performed at [facility], [address], [place], [place] [address]    Current Facility-Administered Medications  Medication Dose Route Frequency Provider Last Rate Last Admin   amLODipine (NORVASC) tablet 10 mg  10 mg Oral Daily [name] E, NP   10 mg at 05/19/23 2694    calcium carbonate (TUMS - dosed in mg elemental calcium) chewable tablet 200 mg of elemental calcium  1 tablet Oral Once Willy Eddy, MD       chlorproMAZINE (THORAZINE) tablet 25 mg  25 mg Oral Daily Charm Rings, NP   25 mg at 05/18/23 1506   chlorproMAZINE (THORAZINE) tablet 50 mg  50 mg Oral  BID Charm Rings, NP   50 mg at 05/19/23 0941   diphenhydrAMINE (BENADRYL) capsule 50 mg  50 mg Oral Once PRN Lauree Chandler, NP       Or   diphenhydrAMINE (BENADRYL) injection 50 mg  50 mg Intramuscular Once PRN Lauree Chandler, NP       divalproex (DEPAKOTE ER) 24 hr tablet 750 mg  750 mg Oral BID Charm Rings, NP   750 mg at 05/19/23 2956   haloperidol (HALDOL) tablet 5 mg  5 mg Oral Once PRN Lauree Chandler, NP       Or   haloperidol lactate (HALDOL) injection 5 mg  5 mg Intramuscular Once PRN Lauree Chandler, NP       LORazepam (ATIVAN) tablet 2 mg  2 mg Oral Once PRN Lauree Chandler, NP       Or   LORazepam (ATIVAN) injection 2 mg  2 mg Intramuscular Once PRN Lauree Chandler, NP       multivitamin with minerals tablet 1 tablet  1 tablet Oral Daily Barrie Folk, [facility]   1 tablet at 05/19/23 2130   nicotine polacrilex (NICORETTE) gum 2 mg  2 mg Oral PRN Chales Abrahams, NP       OLANZapine zydis (ZYPREXA) disintegrating tablet 10 mg  10 mg Oral Q8H PRN [name] E, NP   10 mg at 05/18/23 2100   And   ziprasidone (GEODON) injection 20 mg  20 mg Intramuscular PRN Chales Abrahams, NP       paliperidone (INVEGA) 24 hr tablet 9 mg  9 mg Oral Daily Charm Rings, NP   9 mg at 05/19/23 8657   propranolol (INDERAL) tablet 10 mg  10 mg Oral TID Chales Abrahams, NP   10 mg at 05/19/23 8469   traZODone (DESYREL) tablet 100 mg  100 mg Oral QHS Phineas Semen, MD   100 mg at 05/18/23 2100   Current Outpatient Medications  Medication Sig Dispense Refill   chlorproMAZINE (THORAZINE) 25 MG tablet Take 25 mg by mouth 3 (three) times daily.     cloZAPine  (CLOZARIL) 100 MG tablet Take 150 mg by mouth 2 (two) times daily.     divalproex (DEPAKOTE ER) 500 MG 24 hr tablet Take 1 tablet (500 mg total) by mouth 2 (two) times daily. 60 tablet 1   glycopyrrolate (ROBINUL) 2 MG tablet Take 2 mg by mouth 2 (two) times daily.     Multiple Vitamin (MULTIVITAMIN ADULT PO) Take 1 tablet by mouth daily.     nicotine (NICODERM CQ - DOSED IN MG/24 HOURS) 21 mg/24hr patch Place 1 patch (21 mg total) onto the skin daily. 28 patch 1   nicotine polacrilex (NICORETTE) 2 MG gum Take 1 each (2 mg total) by mouth as needed for smoking cessation. 100 tablet 1   paliperidone (INVEGA SUSTENNA) 234 MG/1.5ML injection Inject 234 mg into the muscle every 28 (twenty-eight) days. 1.8 mL 1   traZODone (DESYREL) 100 MG tablet Take 1 tablet (100 mg total) by mouth at bedtime. 30 tablet 1   amLODipine (NORVASC) 10 MG tablet Take 1 tablet (10 mg total) by mouth daily. (Patient not taking: Reported on 03/27/2023) 30 tablet 1   clonazePAM (KLONOPIN) 0.5 MG tablet Take 1 tablet (0.5 mg total) by mouth 2 (two) times daily. (Patient not taking: Reported on 03/27/2023) 60 tablet 0   glycopyrrolate (ROBINUL) 1 MG tablet Take by mouth. (Patient not taking:  Reported on 05/02/2023)     Glycopyrrolate 1.5 MG TABS Take 1 tablet by mouth 2 (two) times daily. (Patient not taking: Reported on 05/02/2023)     haloperidol (HALDOL) 5 MG tablet Take 1 tablet (5 mg total) by mouth at bedtime. (Patient not taking: Reported on 03/27/2023) 30 tablet 1   paliperidone (INVEGA) 3 MG 24 hr tablet Take 2 tablets (6 mg total) by mouth daily. (Patient not taking: Reported on 02/14/2023) 60 tablet 1   propranolol (INDERAL) 10 MG tablet Take 1 tablet (10 mg total) by mouth 3 (three) times daily. (Patient not taking: Reported on 03/27/2023) 90 tablet 1   temazepam (RESTORIL) 15 MG capsule Take 15 mg by mouth at bedtime. (Patient not taking: Reported on 05/02/2023)      Musculoskeletal: Strength & Muscle Tone: within normal  limits Gait & Station: normal Patient leans: N/A  Psychiatric Specialty Exam:  Presentation  General Appearance:  Appropriate for Environment; Other (comment) (hospital scrubs)  Eye Contact: Fair  Speech: Clear and Coherent; Normal Rate  Speech Volume: Normal  Handedness: Right   Mood and Affect  Mood: -- ("good")  Affect: Flat   Thought Process  Thought Processes: Disorganized  Descriptions of Associations:Tangential  Orientation:Partial (disoriented to month)  Thought Content:Delusions  History of Schizophrenia/Schizoaffective disorder:Yes  Duration of Psychotic Symptoms:Greater than six months  Hallucinations:Hallucinations: None  Ideas of Reference:None  Suicidal Thoughts:Suicidal Thoughts: No  Homicidal Thoughts:Homicidal Thoughts: No   Sensorium  Memory: Immediate Fair  Judgment: Poor  Insight: Shallow   Executive Functions  Concentration: Fair  Attention Span: Fair  Recall: Fair  Fund of Knowledge: Fair  Language: Fair   Psychomotor Activity  Psychomotor Activity: Psychomotor Activity: Normal   Assets  Assets: Communication Skills; Desire for Improvement; Financial Resources/Insurance; Leisure Time; Resilience; Physical Health   Sleep  Sleep: Sleep: Fair   Physical Exam: Physical Exam Constitutional:      General: He is not in acute distress.    Appearance: He is not ill-appearing, toxic-appearing or diaphoretic.  Eyes:     General: No scleral icterus. Cardiovascular:     Rate and Rhythm: Normal rate.  Pulmonary:     Effort: Pulmonary effort is normal.  Neurological:     Mental Status: He is alert. He is disoriented.  Psychiatric:        Attention and Perception: Attention and perception normal.        Mood and Affect: Mood normal. Affect is flat.        Speech: Speech is tangential.        Behavior: Behavior is cooperative.        Thought Content: Thought content is delusional. Thought content  does not include homicidal or suicidal ideation.    Review of Systems  Constitutional:  Negative for chills and fever.  Respiratory:  Negative for shortness of breath.   Cardiovascular:  Negative for chest pain and palpitations.  Gastrointestinal:  Negative for abdominal pain.  Neurological:  Negative for headaches.  Psychiatric: Positive for delusions.  Blood pressure 131/87, pulse 84, temperature 97.9 F (36.6 C), temperature source Oral, resp. rate 16, height 6\' 1"  (1.854 m), weight 127 kg, SpO2 98%. Body mass index is 36.94 kg/m.  Treatment Plan Summary: Daily contact with patient to assess and evaluate symptoms and progress in treatment, Medication management, and Plan    Schizophrenia, undifferentiated -Changed pt's thorazine from 25mg  oral daily and 50 oral 2 times daily to 50mg  3 times daily -Pt received first loading dose of  Invega Sustenna 234 mg on 05/14/23, next loading dose 156mg  due on 05/21/23. Continue Invega 9mg  oral daily.  -Continue Depakote ER 750mg  oral 2 times daily. Reviewed depakote level from 05/17/23 = 87 within therapeutic limits  Anxiety -Continue propanolol 10mg  oral 3 times daily  Smoking cessation -Continue nicorette 2mg  oral as needed smoking cessation  Insomnia -Continue trazodone 100mg  oral daily at bedtime  Agitation PRNs -Continue zyprexa 10mg  oral every 8 hours PRN agitation -Continue geodon 20mg  IM as needed agitation -Start benadryl 50mg  oral or IM once PRN severe agitation -Start haldol 5mg  oral or IM once PRN severe agitation -Start ativan 2mg  oral or IM once PRN severe agitation  Disposition: Pt is on [facility] priority waitlist.  Lauree Chandler, NP 05/19/2023 10:06 AM

## 2023-05-19 NOTE — ED Notes (Signed)
Pt received dinner and beverage.

## 2023-05-19 NOTE — ED Notes (Signed)
Patient came out of bathroom without any clothes on. Writer and other RN placed covering on the door to prevent other patient seeing patient.

## 2023-05-19 NOTE — BH Assessment (Signed)
Writer received call from [facility]) requesting updated patient notes. Writer faxed documentation to [phone].

## 2023-05-20 MED ORDER — PALIPERIDONE PALMITATE ER 156 MG/ML IM SUSY
156.0000 mg | PREFILLED_SYRINGE | Freq: Once | INTRAMUSCULAR | Status: AC
  Administered 2023-05-21: 156 mg via INTRAMUSCULAR
  Filled 2023-05-20: qty 1

## 2023-05-20 MED ORDER — DIVALPROEX SODIUM ER 500 MG PO TB24
750.0000 mg | ORAL_TABLET | Freq: Two times a day (BID) | ORAL | Status: DC
  Filled 2023-05-20: qty 1

## 2023-05-20 MED ORDER — DIVALPROEX SODIUM ER 500 MG PO TB24
750.0000 mg | ORAL_TABLET | Freq: Every day | ORAL | Status: DC
  Administered 2023-05-20 – 2023-05-21 (×2): 750 mg via ORAL
  Filled 2023-05-20 (×2): qty 1

## 2023-05-20 MED ORDER — PALIPERIDONE ER 3 MG PO TB24
3.0000 mg | ORAL_TABLET | Freq: Every day | ORAL | Status: DC
  Administered 2023-05-21: 3 mg via ORAL
  Filled 2023-05-20: qty 1

## 2023-05-20 NOTE — ED Notes (Signed)
IVC  [facility]  WAITLIST 

## 2023-05-20 NOTE — ED Notes (Signed)
Hospital meal provided, pt tolerated w/o complaints.  Waste discarded appropriately.  

## 2023-05-20 NOTE — ED Notes (Addendum)
Patient pacing in unit and banging on window demanding new shirt after patient ripped sleeve on current shirt. Patient advised that he would get new shirt after his morning shower.  Patient cursing at this Clinical research associate and Field seismologist a Research officer, political party.  Writer gave patient a sheet and blanket for bed since patient used current sheet and blanket to dry off with after standing in shower earlier in shift. Patient currently laying in bed.

## 2023-05-20 NOTE — ED Notes (Signed)
[facility] waitlist 

## 2023-05-20 NOTE — ED Notes (Signed)
During nursing assessment Mr Grudzinski was A/Ox 4 .  He  stated that he is not currently have thoughts or feelings of SI/HI.  Bernhardt also  reported that he is not currently having auditory or visual hallucinations.  Pt affect is congruent with situation , eye contact is good , speech is of normal rate and volume with appropriate verbiage noted.  Staff addressed any feelings or concerns that have been brought up.  Medications were administered as ordered. Pt continues to be calm and cooperative through out this shift. Continue to monitor patient as ordered for any changes in behaviors and for continued safety.

## 2023-05-20 NOTE — ED Notes (Signed)
Patient asked writer to turn TV on due to timer cutting tv off. This Clinical research associate went and cut on TV.  Writer asked patient if he would please straighten up room of the trash. Patient agreeable and picked up trash in room.

## 2023-05-20 NOTE — ED Provider Notes (Signed)
Emergency Medicine Observation Re-evaluation Note  Physical Exam   BP 131/86   Pulse 83   Temp 98.3 F (36.8 C) (Oral)   Resp (!) 21   Ht 6\' 1"  (1.854 m)   Wt 127 kg   SpO2 99%   BMI 36.94 kg/m   Patient appears in no acute distress.  ED Course / MDM   No reported events during my shift at the time of this note.   Pt is awaiting dispo from consultants   Pilar Jarvis MD    Pilar Jarvis, MD 05/20/23 [phone]

## 2023-05-20 NOTE — ED Notes (Signed)
Pt taking shower. Pt was given hygiene items and the following, 1 clean top, 1 clean bottom, with 1 pair of disposable underwear.  Pt changed out into clean clothing.  Staff disposed of all shower supplies.   

## 2023-05-20 NOTE — ED Notes (Signed)
Staff spoke with Mr Hennington in reference to his request to have the opportunity to socialize with peers. Haile was able to sit with staff and discuss the parameters and expectations of staff.  He was agreeable to the terms.  He stated that he felt that if he became agitated he would be able to remove himself from a situation prior to the situation becoming an emergency.  He was able to have a respectful conversation with staff as well as being able to verbalize understanding.  During meals he will be able to stay in the day room with additional security allowing Mr Zero more freedom of movement during his stay.  Cont to monitor patient as ordered

## 2023-05-20 NOTE — Consult Note (Addendum)
[facility] Face-to-Face Psychiatry Consult   Reason for Consult:  Admit Referring Physician:  Dr. Concha Se Patient Identification: [name] [name] MRN:  [id number] Principal Diagnosis: Schizophrenia, undifferentiated (HCC) Diagnosis:  Principal Problem:   Schizophrenia, undifferentiated (HCC)  Total Time spent with patient, including time spent reviewing pt's chart: 30 minutes  HPI:   Pt chart reviewed and seen on rounds. Per chart review, last night during HS medications, pt was in shower, and water was cut off. Pt took his medications, although appears afterwards banged on the window for a new shirt and cursed at [employer]. No PRN agitation medications were needed.  On assessment today, pt is sleeping, although wakens easily for assessment. He tells me he is doing "incredibly well". We discussed events last night. Pt reports he does not get along with night staff due to personality differences. He reports he slept well and has a good appetite. He denies suicidal, homicidal ideations. He denies auditory visual hallucinations or paranoia. We discussed getting the second loading dose of invega sustenna tomorrow and he was in agreement. I have ordered invega sustenna 156mg  for tomorrow.   Past Psychiatric History: "Per chart review, patient has a hx of Schizophrenia with multiple inpatient admissions. Last encounter was sept 4, 2024 when he was evaluated at ED and sent to [facility] for treatment. "  Risk to Self: Denies suicidal ideations Risk to Others: Denies homicidal ideations Prior Inpatient Therapy: Yes Prior Outpatient Therapy: Yes connected with [facility]  Past Medical History:  Past Medical History:  Diagnosis Date   Aggressive behavior    Asthma    Depression    Psychosis (HCC)    Schizoaffective disorder (HCC)     Past Surgical History:  Procedure Laterality Date   BACK SURGERY     I&D groin  2017   Family History: History reviewed. No pertinent family  history. Family Psychiatric  History: None reported Social History:  Social History   Substance and Sexual Activity  Alcohol Use No     Social History   Substance and Sexual Activity  Drug Use Yes   Types: Marijuana, Methamphetamines, Cocaine   Comment: reports cannabis use as funds permit and use of crack cocaine and meth when able    Social History   Socioeconomic History   Marital status: Single    Spouse name: Not on file   Number of children: Not on file   Years of education: Not on file   Highest education level: Not on file  Occupational History   Not on file  Tobacco Use   Smoking status: Every Day    Current packs/day: 0.25    Types: Cigarettes   Smokeless tobacco: Never  Vaping Use   Vaping status: Some Days  Substance and Sexual Activity   Alcohol use: No   Drug use: Yes    Types: Marijuana, Methamphetamines, Cocaine    Comment: reports cannabis use as funds permit and use of crack cocaine and meth when able   Sexual activity: Not Currently    Partners: Female  Other Topics Concern   Not on file  Social History Narrative   Not on file   Social Determinants of Health   Financial Resource Strain: Not on file  Food Insecurity: No Food Insecurity (11/04/2022)   Hunger Vital Sign    Worried About Running Out of Food in the Last Year: Never true    Ran Out of Food in the Last Year: Never true  Transportation  Needs: No Transportation Needs (11/04/2022)   PRAPARE - Administrator, Civil Service (Medical): No    Lack of Transportation (Non-Medical): No  Physical Activity: Not on file  Stress: Not on file  Social Connections: Not on file   Additional Social History:    Allergies:  No Known Allergies  Labs: No results found for this or any previous visit (from the past 48 hour(s)).  Current Facility-Administered Medications  Medication Dose Route Frequency Provider Last Rate Last Admin   amLODipine (NORVASC) tablet 10 mg  10 mg Oral Daily  [name] E, NP   10 mg at 05/19/23 1610   chlorproMAZINE (THORAZINE) tablet 50 mg  50 mg Oral TID Lauree Chandler, NP   50 mg at 05/19/23 2120   diphenhydrAMINE (BENADRYL) capsule 50 mg  50 mg Oral Once PRN Lauree Chandler, NP       Or   diphenhydrAMINE (BENADRYL) injection 50 mg  50 mg Intramuscular Once PRN Lauree Chandler, NP       divalproex (DEPAKOTE ER) 24 hr tablet 750 mg  750 mg Oral BID Bari Mantis A, RPH       haloperidol (HALDOL) tablet 5 mg  5 mg Oral Once PRN Lauree Chandler, NP       Or   haloperidol lactate (HALDOL) injection 5 mg  5 mg Intramuscular Once PRN Lauree Chandler, NP       LORazepam (ATIVAN) tablet 2 mg  2 mg Oral Once PRN Lauree Chandler, NP       Or   LORazepam (ATIVAN) injection 2 mg  2 mg Intramuscular Once PRN Lauree Chandler, NP       multivitamin with minerals tablet 1 tablet  1 tablet Oral Daily Barrie Folk, [facility]   1 tablet at 05/19/23 9604   nicotine polacrilex (NICORETTE) gum 2 mg  2 mg Oral PRN Chales Abrahams, NP       OLANZapine zydis (ZYPREXA) disintegrating tablet 10 mg  10 mg Oral Q8H PRN [name] E, NP   10 mg at 05/18/23 2100   And   ziprasidone (GEODON) injection 20 mg  20 mg Intramuscular PRN Chales Abrahams, NP       paliperidone (INVEGA) 24 hr tablet 9 mg  9 mg Oral Daily Charm Rings, NP   9 mg at 05/19/23 5409   propranolol (INDERAL) tablet 10 mg  10 mg Oral TID Chales Abrahams, NP   10 mg at 05/19/23 2119   traZODone (DESYREL) tablet 100 mg  100 mg Oral Irene Limbo, MD   100 mg at 05/19/23 2120   Current Outpatient Medications  Medication Sig Dispense Refill   chlorproMAZINE (THORAZINE) 25 MG tablet Take 25 mg by mouth 3 (three) times daily.     cloZAPine (CLOZARIL) 100 MG tablet Take 150 mg by mouth 2 (two) times daily.     divalproex (DEPAKOTE ER) 500 MG 24 hr tablet Take 1 tablet (500 mg total) by mouth 2 (two) times daily. 60 tablet 1   glycopyrrolate (ROBINUL) 2 MG tablet  Take 2 mg by mouth 2 (two) times daily.     Multiple Vitamin (MULTIVITAMIN ADULT PO) Take 1 tablet by mouth daily.     nicotine (NICODERM CQ - DOSED IN MG/24 HOURS) 21 mg/24hr patch Place 1 patch (21 mg total) onto the skin daily. 28 patch 1   nicotine polacrilex (NICORETTE) 2 MG gum Take 1 each (2 mg  total) by mouth as needed for smoking cessation. 100 tablet 1   paliperidone (INVEGA SUSTENNA) 234 MG/1.5ML injection Inject 234 mg into the muscle every 28 (twenty-eight) days. 1.8 mL 1   traZODone (DESYREL) 100 MG tablet Take 1 tablet (100 mg total) by mouth at bedtime. 30 tablet 1   amLODipine (NORVASC) 10 MG tablet Take 1 tablet (10 mg total) by mouth daily. (Patient not taking: Reported on 03/27/2023) 30 tablet 1   clonazePAM (KLONOPIN) 0.5 MG tablet Take 1 tablet (0.5 mg total) by mouth 2 (two) times daily. (Patient not taking: Reported on 03/27/2023) 60 tablet 0   glycopyrrolate (ROBINUL) 1 MG tablet Take by mouth. (Patient not taking: Reported on 05/02/2023)     Glycopyrrolate 1.5 MG TABS Take 1 tablet by mouth 2 (two) times daily. (Patient not taking: Reported on 05/02/2023)     haloperidol (HALDOL) 5 MG tablet Take 1 tablet (5 mg total) by mouth at bedtime. (Patient not taking: Reported on 03/27/2023) 30 tablet 1   paliperidone (INVEGA) 3 MG 24 hr tablet Take 2 tablets (6 mg total) by mouth daily. (Patient not taking: Reported on 02/14/2023) 60 tablet 1   propranolol (INDERAL) 10 MG tablet Take 1 tablet (10 mg total) by mouth 3 (three) times daily. (Patient not taking: Reported on 03/27/2023) 90 tablet 1   temazepam (RESTORIL) 15 MG capsule Take 15 mg by mouth at bedtime. (Patient not taking: Reported on 05/02/2023)      Musculoskeletal: Strength & Muscle Tone: within normal limits Gait & Station: normal Patient leans: N/A            Psychiatric Specialty Exam:  Presentation  General Appearance:  Appropriate for Environment; Other (comment) (hospital scrubs)  Eye  Contact: Fair  Speech: Clear and Coherent; Normal Rate  Speech Volume: Normal  Handedness: Right   Mood and Affect  Mood: -- ("good")  Affect: Flat   Thought Process  Thought Processes: Disorganized  Descriptions of Associations:Tangential  Orientation:Partial (disoriented to month)  Thought Content:Delusions  History of Schizophrenia/Schizoaffective disorder:Yes  Duration of Psychotic Symptoms:Greater than six months  Hallucinations:Hallucinations: None  Ideas of Reference:None  Suicidal Thoughts:Suicidal Thoughts: No  Homicidal Thoughts:Homicidal Thoughts: No   Sensorium  Memory: Immediate Fair  Judgment: Poor  Insight: Shallow   Executive Functions  Concentration: Fair  Attention Span: Fair  Recall: Fair  Fund of Knowledge: Fair  Language: Fair   Psychomotor Activity  Psychomotor Activity: Psychomotor Activity: Normal   Assets  Assets: Communication Skills; Desire for Improvement; Financial Resources/Insurance; Leisure Time; Resilience; Physical Health   Sleep  Sleep: Sleep: Fair   Physical Exam: Physical Exam Constitutional:      General: He is not in acute distress.    Appearance: He is not ill-appearing, toxic-appearing or diaphoretic.  Eyes:     General: No scleral icterus. Cardiovascular:     Rate and Rhythm: Tachycardia present.  Pulmonary:     Effort: Pulmonary effort is normal. No respiratory distress.  Neurological:     Mental Status: He is alert. He is disoriented.  Psychiatric:        Attention and Perception: Attention and perception normal.        Mood and Affect: Mood normal. Affect is flat.        Speech: Speech normal.        Behavior: Behavior normal. Behavior is cooperative.        Thought Content: Thought content normal.    Review of Systems  Constitutional:  Negative for chills  and fever.  Respiratory:  Negative for shortness of breath.   Cardiovascular:  Negative for chest pain and  palpitations.  Gastrointestinal:  Negative for abdominal pain.  Neurological:  Negative for headaches.  Psychiatric/Behavioral: Negative.     Blood pressure 125/89, pulse (!) 105, temperature 97.8 F (36.6 C), temperature source Oral, resp. rate 18, height 6\' 1"  (1.854 m), weight 127 kg, SpO2 98%. Body mass index is 36.94 kg/m.  Treatment Plan Summary: Daily contact with patient to assess and evaluate symptoms and progress in treatment, Medication management, and Plan    Schizophrenia, undifferentiated -Continue pt's thorazine from 25mg  oral daily and 50 oral 2 times daily to 50mg  3 times daily -Pt received first loading dose of Invega Sustenna 234 mg on 05/14/23, next loading dose 156mg  due on 05/21/23. Ordered Invega Sustenna 156mg  for tomorrow 05/21/23. Changed pt's oral invega from 9mg  to 3mg  for 5 days starting tomorrow 05/21/23. Discussed with Dr. Marval Regal, attending psychiatrist, who is in agreement with this. -Continue Depakote ER 750mg  oral 2 times daily. Reviewed depakote level from 05/17/23 = 87 within therapeutic limits   Anxiety -Continue propanolol 10mg  oral 3 times daily   Smoking cessation -Continue nicorette 2mg  oral as needed smoking cessation   Insomnia -Continue trazodone 100mg  oral daily at bedtime   Agitation PRNs -Continue zyprexa 10mg  oral every 8 hours PRN agitation -Continue geodon 20mg  IM as needed agitation -Continue benadryl 50mg  oral or IM once PRN severe agitation -Continue haldol 5mg  oral or IM once PRN severe agitation -Continue ativan 2mg  oral or IM once PRN severe agitation  Disposition: Recommend psychiatric Inpatient admission when medically cleared. Supportive therapy provided about ongoing stressors. Pt is currently on [facility] priority waitlist.  Lauree Chandler, NP 05/20/2023 8:47 AM

## 2023-05-21 MED ORDER — DIVALPROEX SODIUM ER 250 MG PO TB24
750.0000 mg | ORAL_TABLET | Freq: Two times a day (BID) | ORAL | Status: DC
  Administered 2023-05-21 – 2023-05-28 (×14): 750 mg via ORAL
  Filled 2023-05-21 (×14): qty 1

## 2023-05-21 MED ORDER — PALIPERIDONE ER 3 MG PO TB24
6.0000 mg | ORAL_TABLET | Freq: Every day | ORAL | Status: AC
  Administered 2023-05-22 – 2023-05-25 (×4): 6 mg via ORAL
  Filled 2023-05-21 (×4): qty 2

## 2023-05-21 NOTE — ED Provider Notes (Signed)
Emergency Medicine Observation Re-evaluation Note  [name] [name] is a 27 y.o. male, seen on rounds today.  Pt initially presented to the ED for complaints of No chief complaint on file. Currently, the patient is resting comfortably.  Physical Exam  BP (!) 133/93 (BP Location: Right Arm)   Pulse 99   Temp 98.2 F (36.8 C) (Oral)   Resp 18   Ht 6\' 1"  (1.854 m)   Wt 127 kg   SpO2 99%   BMI 36.94 kg/m  Physical Exam Patient appears well, no acute distress, normal WOB    ED Course / MDM  EKG:EKG Interpretation Date/Time:  Saturday May 17 2023 18:16:29 EDT Ventricular Rate:  70 PR Interval:  134 QRS Duration:  98 QT Interval:  372 QTC Calculation: 401 R Axis:   78  Text Interpretation: Normal sinus rhythm with sinus arrhythmia Abnormal QRS-T angle, consider primary T wave abnormality Abnormal ECG When compared with ECG of 17-May-2023 18:13, (unconfirmed) Criteria for Lateral infarct are no longer Present Confirmed by UNCONFIRMED, DOCTOR ([id number]), editor Lonell Face [phone]) on 05/19/2023 8:23:39 AM  I have reviewed the labs performed to date as well as medications administered while in observation.  Recent changes in the last 24 hours include none.  Plan  Current plan is for admission to [facility].    Chesley Noon, MD 05/21/23 [id number]

## 2023-05-21 NOTE — ED Notes (Signed)
Patient in locked unit screaming profanities pacing around unit.

## 2023-05-21 NOTE — ED Notes (Addendum)
Staff sat in dayroom with patient and [employer] x3 while patient ate breakfast.  Pt was calm and cooperative.  He was able to hold conversation with staff with typical / ordinary content.  After meal was consumed pt returned w/o incident to room.  Cont to monitor as ordered

## 2023-05-21 NOTE — ED Notes (Signed)
Pt is sleeping will offer evening snack when he wakes up.

## 2023-05-21 NOTE — ED Notes (Signed)
Pt was seen by staff to be interacting with the television.  He was loudly speaking to it, staff went to patient to assess, pt stated that he was "getting ready to start my journey, I am the god that rules [place]."  Staff was able to verbally de-escalate pt delusion enough to have patient take PO PRN medication ordered by provider.  Pt then went back in room to watch tv.  Pt now resting calmly. Cont to monitor as ordered

## 2023-05-21 NOTE — Consult Note (Addendum)
[facility] Face-to-Face Psychiatry Consult   Reason for Consult:  Admit Referring Physician:  Dr. Alben Spittle. [place] Patient Identification: [name] [name] MRN:  [id number] Principal Diagnosis: Schizophrenia, undifferentiated (HCC) Diagnosis:  Principal Problem:   Schizophrenia, undifferentiated (HCC)  Total Time spent with patient, including time spent reviewing pt's chart and attempting to obtain collateral: 30 minutes  HPI:   Pt chart reviewed and seen on rounds. Per chart review, no significant events during day shift yesterday. Appears during night shift pt was noted to be screaming profanities pacing around unit and told RN "look it's bitch patrol, suck my dick." Per MAR, no agitation PRNs required.   On assessment today, pt reports he is "doing well". Reports he did not sleep well last night and sleeps during the day. We discussed his second loading dose of invega sustenna will be administered today. He is in agreement. He denies suicidal, homicidal ideations. He denies auditory visual hallucinations or paranoia. There is no objective evidence of gross psychosis or mania. He does not appear to be responding to internal stimuli. No agitation, aggression or distractibility noted. No paranoia or delusions elicited. He asks about when he will be able to go home. Discussed we are working on a plan. He states he will be returning to live with his mother. Discussed will attempt to reach his mother today.   Attempted to speak w/ pt's mother, [name] at [phone]. Unable to get in contact at this time.  Update: Pt later noted to be agitated and disorganized. He was noted to be yelling at the door stating he is "[place]" and needs to go to [place]. He received haldol 5mg  oral, ativan 2mg  oral, and benadryl 50mg  oral for agitation. Per RN, was reported during shift change that pt did not sleep last night. Was also noted that pt's depakote had been discontinued from 750mg  oral bid yesterday and  reordered as 750mg  oral daily. Changed back to depakote 750mg  oral bid.   Past Psychiatric History: "Per chart review, patient has a hx of Schizophrenia with multiple inpatient admissions. Last encounter was sept 4, 2024 when he was evaluated at ED and sent to [facility] for treatment. "   Risk to Self: Denies suicidal ideations Risk to Others: Denies homicidal ideations Prior Inpatient Therapy: Yes Prior Outpatient Therapy: Yes connected with [facility] ACT team  Past Medical History:  Past Medical History:  Diagnosis Date   Aggressive behavior    Asthma    Depression    Psychosis (HCC)    Schizoaffective disorder (HCC)     Past Surgical History:  Procedure Laterality Date   BACK SURGERY     I&D groin  2017   Family History: History reviewed. No pertinent family history. Family Psychiatric  History: None reported Social History:  Social History   Substance and Sexual Activity  Alcohol Use No     Social History   Substance and Sexual Activity  Drug Use Yes   Types: Marijuana, Methamphetamines, Cocaine   Comment: reports cannabis use as funds permit and use of crack cocaine and meth when able    Social History   Socioeconomic History   Marital status: Single    Spouse name: Not on file   Number of children: Not on file   Years of education: Not on file   Highest education level: Not on file  Occupational History   Not on file  Tobacco Use   Smoking status: Every Day    Current packs/day: 0.25  Types: Cigarettes   Smokeless tobacco: Never  Vaping Use   Vaping status: Some Days  Substance and Sexual Activity   Alcohol use: No   Drug use: Yes    Types: Marijuana, Methamphetamines, Cocaine    Comment: reports cannabis use as funds permit and use of crack cocaine and meth when able   Sexual activity: Not Currently    Partners: Female  Other Topics Concern   Not on file  Social History Narrative   Not on file   Social Determinants of Health    Financial Resource Strain: Not on file  Food Insecurity: No Food Insecurity (11/04/2022)   Hunger Vital Sign    Worried About Running Out of Food in the Last Year: Never true    Ran Out of Food in the Last Year: Never true  Transportation Needs: No Transportation Needs (11/04/2022)   PRAPARE - Administrator, Civil Service (Medical): No    Lack of Transportation (Non-Medical): No  Physical Activity: Not on file  Stress: Not on file  Social Connections: Not on file   Allergies:  No Known Allergies  Labs: No results found for this or any previous visit (from the past 48 hour(s)).  Current Facility-Administered Medications  Medication Dose Route Frequency Provider Last Rate Last Admin   amLODipine (NORVASC) tablet 10 mg  10 mg Oral Daily [name] E, NP   10 mg at 05/21/23 0981   chlorproMAZINE (THORAZINE) tablet 50 mg  50 mg Oral TID Lauree Chandler, NP   50 mg at 05/21/23 1914   diphenhydrAMINE (BENADRYL) capsule 50 mg  50 mg Oral Once PRN Lauree Chandler, NP       Or   diphenhydrAMINE (BENADRYL) injection 50 mg  50 mg Intramuscular Once PRN Lauree Chandler, NP       divalproex (DEPAKOTE ER) 24 hr tablet 750 mg  750 mg Oral Daily Angelique Blonder, RPH   750 mg at 05/21/23 7829   haloperidol (HALDOL) tablet 5 mg  5 mg Oral Once PRN Lauree Chandler, NP       Or   haloperidol lactate (HALDOL) injection 5 mg  5 mg Intramuscular Once PRN Lauree Chandler, NP       LORazepam (ATIVAN) tablet 2 mg  2 mg Oral Once PRN Lauree Chandler, NP       Or   LORazepam (ATIVAN) injection 2 mg  2 mg Intramuscular Once PRN Lauree Chandler, NP       multivitamin with minerals tablet 1 tablet  1 tablet Oral Daily Barrie Folk, [facility]   1 tablet at 05/21/23 5621   nicotine polacrilex (NICORETTE) gum 2 mg  2 mg Oral PRN Chales Abrahams, NP       OLANZapine zydis (ZYPREXA) disintegrating tablet 10 mg  10 mg Oral Q8H PRN [name] E, NP   10 mg at 05/18/23  2100   And   ziprasidone (GEODON) injection 20 mg  20 mg Intramuscular PRN Chales Abrahams, NP       paliperidone (INVEGA SUSTENNA) injection 156 mg  156 mg Intramuscular Once Lauree Chandler, NP       paliperidone (INVEGA) 24 hr tablet 3 mg  3 mg Oral Daily Lauree Chandler, NP   3 mg at 05/21/23 3086   propranolol (INDERAL) tablet 10 mg  10 mg Oral TID Chales Abrahams, NP   10 mg at 05/21/23 0752   traZODone (DESYREL) tablet  100 mg  100 mg Oral QHS Phineas Semen, MD   100 mg at 05/20/23 2103   Current Outpatient Medications  Medication Sig Dispense Refill   chlorproMAZINE (THORAZINE) 25 MG tablet Take 25 mg by mouth 3 (three) times daily.     cloZAPine (CLOZARIL) 100 MG tablet Take 150 mg by mouth 2 (two) times daily.     divalproex (DEPAKOTE ER) 500 MG 24 hr tablet Take 1 tablet (500 mg total) by mouth 2 (two) times daily. 60 tablet 1   glycopyrrolate (ROBINUL) 2 MG tablet Take 2 mg by mouth 2 (two) times daily.     Multiple Vitamin (MULTIVITAMIN ADULT PO) Take 1 tablet by mouth daily.     nicotine (NICODERM CQ - DOSED IN MG/24 HOURS) 21 mg/24hr patch Place 1 patch (21 mg total) onto the skin daily. 28 patch 1   nicotine polacrilex (NICORETTE) 2 MG gum Take 1 each (2 mg total) by mouth as needed for smoking cessation. 100 tablet 1   paliperidone (INVEGA SUSTENNA) 234 MG/1.5ML injection Inject 234 mg into the muscle every 28 (twenty-eight) days. 1.8 mL 1   traZODone (DESYREL) 100 MG tablet Take 1 tablet (100 mg total) by mouth at bedtime. 30 tablet 1   amLODipine (NORVASC) 10 MG tablet Take 1 tablet (10 mg total) by mouth daily. (Patient not taking: Reported on 03/27/2023) 30 tablet 1   clonazePAM (KLONOPIN) 0.5 MG tablet Take 1 tablet (0.5 mg total) by mouth 2 (two) times daily. (Patient not taking: Reported on 03/27/2023) 60 tablet 0   glycopyrrolate (ROBINUL) 1 MG tablet Take by mouth. (Patient not taking: Reported on 05/02/2023)     Glycopyrrolate 1.5 MG TABS Take 1 tablet by mouth  2 (two) times daily. (Patient not taking: Reported on 05/02/2023)     haloperidol (HALDOL) 5 MG tablet Take 1 tablet (5 mg total) by mouth at bedtime. (Patient not taking: Reported on 03/27/2023) 30 tablet 1   paliperidone (INVEGA) 3 MG 24 hr tablet Take 2 tablets (6 mg total) by mouth daily. (Patient not taking: Reported on 02/14/2023) 60 tablet 1   propranolol (INDERAL) 10 MG tablet Take 1 tablet (10 mg total) by mouth 3 (three) times daily. (Patient not taking: Reported on 03/27/2023) 90 tablet 1   temazepam (RESTORIL) 15 MG capsule Take 15 mg by mouth at bedtime. (Patient not taking: Reported on 05/02/2023)      Musculoskeletal: Strength & Muscle Tone: within normal limits Gait & Station: normal Patient leans: N/A            Psychiatric Specialty Exam:  Presentation  General Appearance:  Appropriate for Environment  Eye Contact: Fair  Speech: Clear and Coherent; Other (comment) (delayed at times)  Speech Volume: Normal  Handedness: Right   Mood and Affect  Mood: -- ("doing well")  Affect: Flat   Thought Process  Thought Processes: Coherent  Descriptions of Associations:Intact  Orientation:Partial  Thought Content:WDL  History of Schizophrenia/Schizoaffective disorder:Yes  Duration of Psychotic Symptoms:Greater than six months  Hallucinations:Hallucinations: None  Ideas of Reference:None  Suicidal Thoughts:Suicidal Thoughts: No  Homicidal Thoughts:Homicidal Thoughts: No   Sensorium  Memory: Immediate Fair  Judgment: Intact  Insight: Shallow   Executive Functions  Concentration: Fair  Attention Span: Fair  Recall: Fair  Fund of Knowledge: Fair  Language: Fair   Psychomotor Activity  Psychomotor Activity: Psychomotor Activity: Normal   Assets  Assets: Communication Skills; Desire for Improvement; Financial Resources/Insurance; Leisure Time; Resilience; Physical Health   Sleep  Sleep: Sleep:  Fair (reports  sleeping during the day rather than at night)   Physical Exam: Physical Exam Constitutional:      General: He is not in acute distress.    Appearance: He is not ill-appearing, toxic-appearing or diaphoretic.  Eyes:     General: No scleral icterus. Cardiovascular:     Rate and Rhythm: Normal rate.  Pulmonary:     Effort: Pulmonary effort is normal. No respiratory distress.  Neurological:     Mental Status: He is alert. He is disoriented.  Psychiatric:        Attention and Perception: Attention and perception normal.        Mood and Affect: Mood normal. Affect is flat.        Speech: Speech is delayed.        Behavior: Behavior is slowed. Behavior is cooperative.        Thought Content: Thought content normal.    Review of Systems  Constitutional:  Negative for chills and fever.  Respiratory:  Negative for shortness of breath.   Cardiovascular:  Negative for chest pain and palpitations.  Gastrointestinal:  Negative for abdominal pain.  Neurological:  Negative for headaches.  Psychiatric: Denies suicidal, homicidal ideations. Denies auditory visual hallucinations or paranoia.  Blood pressure (!) 133/93, pulse 99, temperature 98.2 F (36.8 C), temperature source Oral, resp. rate 18, height 6\' 1"  (1.854 m), weight 127 kg, SpO2 99%. Body mass index is 36.94 kg/m.  Treatment Plan Summary: Daily contact with patient to assess and evaluate symptoms and progress in treatment, Medication management, and Plan    Schizophrenia, undifferentiated -Continue pt's thorazine 50mg  oral 3 times daily -Pt received first loading dose of Invega Sustenna 234 mg on 05/14/23, next loading dose 156mg  due today 05/21/23. Confirmed that [place] 156mg  is ordered for today 05/21/23. Pt's oral invega was decreased from 9mg  to 3mg  for 5 days yesterday with a start date of today 05/21/23. Given agitation today, will increase to 6mg  oral daily. Also order CK and repeat EKG. -Appears Depakote ER 750mg   oral 2 times daily was discontinued last night and reordered as 750mg  oral daily. Will change back to Depakote ER 750mg  oral 2 times daily. Reviewed depakote level from 05/17/23 = 87 within therapeutic limits.   Anxiety -Continue propanolol 10mg  oral 3 times daily   Smoking cessation -Continue nicorette 2mg  oral as needed smoking cessation   Insomnia -Continue trazodone 100mg  oral daily at bedtime   Agitation PRNs -Continue zyprexa 10mg  oral every 8 hours PRN agitation -Continue geodon 20mg  IM as needed agitation -Continue benadryl 50mg  oral or IM once PRN severe agitation -Continue haldol 5mg  oral or IM once PRN severe agitation -Continue ativan 2mg  oral or IM once PRN severe agitation  Disposition: Recommend psychiatric Inpatient admission when medically cleared. Pt currently on [facility] priority waitlist.   Lauree Chandler, NP 05/21/2023 8:11 AM

## 2023-05-21 NOTE — ED Notes (Signed)
Pt came to door, was calmly talking to staff stated "I'm excited to eat breakfast with everybody."  Pt affect was bright, good eye contact.  Staff informed pt that when breakfast arrived we would come out and sit with him.  Pt then requested to take a shower prior to breakfast.  Pt taking shower. Pt was given hygiene items and the following, 1 clean top, 1 clean bottom, with 1 pair of disposable underwear.  Pt changed out into clean clothing.  Staff disposed of all shower supplies.

## 2023-05-21 NOTE — ED Notes (Signed)
Writer went to speak with patient. Patient stated, "look it's bitch patrol, suck my dick."

## 2023-05-21 NOTE — ED Notes (Signed)
Patient is screaming out, "Fuck that white bitch, I will kill her."

## 2023-05-21 NOTE — Consult Note (Signed)
Notified by staff that pt's mother had called back. Called pt's mother at [phone]. She states that once pt is psychiatrically cleared he can go back to living with her. She is concerned that he will be [facility]. Discussed with her that I have not been notified that pt's discharge would be to jail. We discussed pt's progress in treatment and presentation today. She verbalized understanding. She requests updates and requests coordination with ACT team once pt is stabilized.

## 2023-05-21 NOTE — ED Notes (Signed)
Ivc /patient is currently on [facility] waitlist

## 2023-05-21 NOTE — BH Assessment (Signed)
Confirmed with [facility] ([name] [name]), patient is on their Waitlist.

## 2023-05-21 NOTE — ED Notes (Signed)
IVC PT [facility] WAITLIST

## 2023-05-21 NOTE — ED Notes (Signed)
Pt meal was offered.  Pt requested to stay in his room and did not want to socialize during dinner.  Pt was able to articulate his wants to staff in a typical matter.  Staff allowed pt to remain in room during meal time.  Cont to monitor as ordered.

## 2023-05-21 NOTE — ED Notes (Signed)
Medication given w/o incident.  Pt continues to be calm and compliant

## 2023-05-22 NOTE — ED Notes (Signed)
Patient in the dayroom and He is calm and cooperative, no behavioral issues noted, no signs of distress. Security in the dayroom with him, staff monitoring Patients.

## 2023-05-22 NOTE — ED Notes (Addendum)
No behavioral issues during shift. Patient took PO medications with no issues. Patient woke up around midnight and asked for something to eat. Writer gave patient graham crackers, jello, cheese and meat cup and a cup of water since patient was sleeping during snack time.

## 2023-05-22 NOTE — Consult Note (Addendum)
  Patient noted lying in the bed, asleep upon this writer's arrival. Per his record, he is compliant with medications. No reports of agitation observed on the unit today.

## 2023-05-22 NOTE — ED Notes (Signed)
IVC PT [facility] WAITLIST

## 2023-05-22 NOTE — ED Notes (Signed)
IVC/ [facility] Waitlist 

## 2023-05-22 NOTE — ED Notes (Signed)
NP went to see the Patient, but He was still sleeping so she said she will come back.

## 2023-05-22 NOTE — ED Notes (Signed)
Pt given dinner snack and beverage

## 2023-05-22 NOTE — ED Notes (Signed)
Assisted pt in cleaning area, trash and dirty linens discarded of appropriately

## 2023-05-22 NOTE — BH Assessment (Signed)
Confirmed with [facility] ([phone]), patient is on their Waitlist.

## 2023-05-22 NOTE — ED Provider Notes (Signed)
Emergency Medicine Observation Re-evaluation Note  [name] [name] is a 27 y.o. male, seen on rounds today.  Pt initially presented to the ED for complaints of Psychiatric Evaluation  Currently, the patient is resting comfortably.  Physical Exam  BP 130/85 (BP Location: Left Arm)   Pulse 99   Temp 98.1 F (36.7 C) (Oral)   Resp 18   Ht 6\' 1"  (1.854 m)   Wt 127 kg   SpO2 99%   BMI 36.94 kg/m  General: No acute distress Cardiac: Well-perfused extremities Lungs: No respiratory distress Psych: Appropriate mood and affect  ED Course / MDM  EKG:EKG Interpretation Date/Time:  Saturday May 17 2023 18:16:29 EDT Ventricular Rate:  70 PR Interval:  134 QRS Duration:  98 QT Interval:  372 QTC Calculation: 401 R Axis:   78  Text Interpretation: Normal sinus rhythm with sinus arrhythmia Abnormal QRS-T angle, consider primary T wave abnormality Abnormal ECG When compared with ECG of 17-May-2023 18:13, (unconfirmed) Criteria for Lateral infarct are no longer Present Confirmed by UNCONFIRMED, DOCTOR ([id number]), editor Lonell Face [phone]) on 05/19/2023 8:23:39 AM  I have reviewed the labs performed to date as well as medications administered while in observation.  Recent changes in the last 24 hours include none.  Plan  Current plan is for placement.   Merwyn Katos, MD 05/22/23 [phone]

## 2023-05-22 NOTE — ED Notes (Signed)
Patient come and set in the dayroom, he was calm and cooperative, no signs of distress, no behavioral issues noted.

## 2023-05-23 NOTE — ED Notes (Signed)
Hospital meal provided, pt tolerated w/o complaints.  Waste discarded appropriately.  

## 2023-05-23 NOTE — BH Assessment (Signed)
Confirmed with [facility] ([phone]), patient is on their Waitlist.  

## 2023-05-23 NOTE — ED Provider Notes (Signed)
Emergency Medicine Observation Re-evaluation Note  [name] [name] is a 27 y.o. male, seen on rounds today.  Pt initially presented to the ED for complaints of Psychiatric Evaluation Currently, the patient is calm.  Physical Exam  BP (!) 147/92 (BP Location: Left Arm)   Pulse 86   Temp 97.6 F (36.4 C) (Oral)   Resp 18   Ht 6\' 1"  (1.854 m)   Wt 127 kg   SpO2 98%   BMI 36.94 kg/m    ED Course / MDM  EKG:EKG Interpretation Date/Time:  Saturday May 17 2023 18:16:29 EDT Ventricular Rate:  70 PR Interval:  134 QRS Duration:  98 QT Interval:  372 QTC Calculation: 401 R Axis:   78  Text Interpretation: Normal sinus rhythm with sinus arrhythmia Abnormal QRS-T angle, consider primary T wave abnormality Abnormal ECG When compared with ECG of 17-May-2023 18:13, (unconfirmed) Criteria for Lateral infarct are no longer Present Confirmed by UNCONFIRMED, DOCTOR ([id number]), editor Lonell Face [phone]) on 05/19/2023 8:23:39 AM  I have reviewed the labs performed to date as well as medications administered while in observation.  Recent changes in the last 24 hours include none.  Plan  Current plan is for Psych dispo.    Shaune Pollack, MD 05/23/23 [phone]

## 2023-05-23 NOTE — ED Notes (Signed)
PT IVC /STILL ON [facility] WAITING LIST.

## 2023-05-23 NOTE — ED Notes (Signed)
Meal tray and water provided to pt. Pt calm at this time.

## 2023-05-23 NOTE — ED Notes (Signed)
IVC papers to expire on 11/1  [facility] waitlist

## 2023-05-23 NOTE — ED Notes (Signed)
Pt sitting in day room eating. Pt cooperative with this RN and Felicia, NT. Pt compliant with taking medications/taking vital signs and socializing appropriately. Security in day room.

## 2023-05-23 NOTE — ED Notes (Signed)
Vital sign assessment delayed due to patient being medicated and sleeping. Chest rise and fall noted on monitor with patient moving intermittently in sleep.

## 2023-05-23 NOTE — BH Assessment (Signed)
Confirmed with [facility] (Pam-[phone]), patient is on their Waitlist. 

## 2023-05-24 DIAGNOSIS — F203 Undifferentiated schizophrenia: Secondary | ICD-10-CM | POA: Diagnosis not present

## 2023-05-24 MED ORDER — FLUTICASONE PROPIONATE 50 MCG/ACT NA SUSP
2.0000 | Freq: Every day | NASAL | Status: DC
  Administered 2023-05-25 – 2023-05-27 (×2): 2 via NASAL
  Filled 2023-05-24: qty 16

## 2023-05-24 MED ORDER — FAMOTIDINE 20 MG PO TABS: 20.0000 mg | ORAL_TABLET | Freq: Two times a day (BID) | ORAL | Status: DC | PRN

## 2023-05-24 NOTE — ED Notes (Signed)
Pt given dinner tray. Pt refusing Flonase at this time.

## 2023-05-24 NOTE — ED Notes (Addendum)
Pt out in [place]

## 2023-05-24 NOTE — ED Notes (Signed)
Patient in the dayroom with security and this nurse , but Patient complaining of feeling stopped up, nasal congestion, Nurse did notify MD and received order for flonase and also order for something for acid reflux that Patient complains about periodically. Patient is calm and maintaining His composure, no signs of distress or no behavioral issues noted. Staff will continue monitor for safety.

## 2023-05-24 NOTE — ED Notes (Signed)
Pt stating he is getting sleepy, pt walked back to his room.

## 2023-05-24 NOTE — ED Notes (Signed)
Pt sitting in room, NAD, calm at this time.

## 2023-05-24 NOTE — ED Notes (Signed)
Nurse gave patient breakfast tray and He consumed 100% and beverage.

## 2023-05-24 NOTE — ED Notes (Signed)
IVC/ [facility] waitlist/ IVC papers expires today.
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
## 2023-05-24 NOTE — ED Provider Notes (Signed)
Emergency Medicine Observation Re-evaluation Note  [name] [name] is a 27 y.o. male, seen on rounds today.  Pt initially presented to the ED for complaints of Psychiatric Evaluation  Currently, the patient is calm, no acute complaints.  Physical Exam  Blood pressure (!) 128/96, pulse 97, temperature 98.1 F (36.7 C), temperature source Oral, resp. rate 20, height 6\' 1"  (1.854 m), weight 127 kg, SpO2 99%. Physical Exam General: NAD Lungs: CTAB Psych: not agitated  ED Course / MDM  EKG:    I have reviewed the labs performed to date as well as medications administered while in observation.  Recent changes in the last 24 hours include no acute evens overnight.    Plan  Current plan is for [facility] admit. Patient is under full IVC at this time.   Sharman Cheek, MD 05/24/23 [id number]

## 2023-05-24 NOTE — ED Notes (Signed)
Pt given humus and pita bread for snack.

## 2023-05-25 DIAGNOSIS — F203 Undifferentiated schizophrenia: Secondary | ICD-10-CM | POA: Diagnosis not present
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
## 2023-05-25 NOTE — ED Notes (Signed)
Pt knocked on window, this tech and security went to check on what pt needed. Pt asked for grape juice, this tech informed pt that it is not allowed at this time as it is bedtime. Pt stated "your so f*cking sexy" this tech explained that that is inappropriate and not to speak like that again

## 2023-05-25 NOTE — ED Notes (Signed)
Hospital meal provided, pt tolerated w/o complaints.  Waste discarded appropriately.  

## 2023-05-25 NOTE — ED Notes (Signed)
IVC/ [facility] Waitlist 

## 2023-05-25 NOTE — BH Assessment (Signed)
Writer spoke with the patient to complete an updated/reassessment. Patient denies SI/HI and AV/H. Patient calm, cooperative and reports of having no concerns or needs at this time.

## 2023-05-25 NOTE — ED Provider Notes (Signed)
Emergency Medicine Observation Re-evaluation Note  [name] [name] is a 27 y.o. male, seen on rounds today.  Pt initially presented to the ED for complaints of Psychiatric Evaluation Currently, the patient is resting comfortably.  Physical Exam  BP (!) 139/91   Pulse 94   Temp 97.7 F (36.5 C) (Oral)   Resp 18   Ht 6\' 1"  (1.854 m)   Wt 127 kg   SpO2 94%   BMI 36.94 kg/m  Physical Exam General: No acute distress Cardiac: No cyanosis Lungs: Normal work of breathing Psych: Calm and cooperative  ED Course / MDM  EKG:EKG Interpretation Date/Time:  Saturday May 17 2023 18:16:29 EDT Ventricular Rate:  70 PR Interval:  134 QRS Duration:  98 QT Interval:  372 QTC Calculation: 401 R Axis:   78  Text Interpretation: Normal sinus rhythm with sinus arrhythmia Abnormal QRS-T angle, consider primary T wave abnormality Abnormal ECG When compared with ECG of 17-May-2023 18:13, (unconfirmed) Criteria for Lateral infarct are no longer Present Confirmed by UNCONFIRMED, DOCTOR ([id number]), editor Lonell Face [phone]) on 05/19/2023 8:23:39 AM  I have reviewed the labs performed to date as well as medications administered while in observation.  Recent changes in the last 24 hours include no acute events.  Plan  Current plan is for social work placement.    Janith Lima, MD 05/25/23 [phone]

## 2023-05-25 NOTE — ED Notes (Signed)
Pt taking shower. Pt was given hygiene items and the following, 1 clean top, 1 clean bottom, with 1 pair of disposable underwear.  Pt changed out into clean clothing.  Staff disposed of all shower supplies.   

## 2023-05-25 NOTE — ED Notes (Signed)
Patient trying to Radiographer, therapeutic and stating, "Give me a hug, I wanna give you a child." Writer nicely refused patient and walked out of locked unit.

## 2023-05-25 NOTE — ED Notes (Signed)
Snack given.

## 2023-05-25 NOTE — ED Notes (Signed)
[name] [name] continues to present at baseline.  Calm, and compliant with medications. Cont to monitor as ordered.

## 2023-05-26 DIAGNOSIS — F203 Undifferentiated schizophrenia: Secondary | ICD-10-CM | POA: Diagnosis not present

## 2023-05-26 MED ORDER — PALIPERIDONE ER 3 MG PO TB24
6.0000 mg | ORAL_TABLET | Freq: Every day | ORAL | Status: DC
  Administered 2023-05-27 – 2023-05-28 (×2): 6 mg via ORAL
  Filled 2023-05-26 (×2): qty 2

## 2023-05-26 NOTE — Consult Note (Addendum)
[name] Face-to-Face Psychiatry Consult   Reason for Consult:  Admit Referring Physician:  Dr. Concha Se Patient Identification: [name] [name] MRN:  [id number] Principal Diagnosis: [name], [name] (HCC) Diagnosis:  Principal Problem:   [name], [name] (HCC)  Total Time spent with patient:  25 minutes  HPI:   Pt chart [name] and seen on rounds. Per chart review, pt presented with hypersexual comments and agitation last night. Appears pt attempted to hug RN and told her "Give me a hug, I wanna give you a child". Pt cursed and spit at security. Pt told NT "your so f*cking sexy". He did not require agitation PRNs and was able to be verbally de-escalated. Last agitation medications were administered on 05/23/23 at 1319, zyprexa 10mg  orally.  On assessment today, pt is calm, cooperative, pleasant. He reports his mood is "good". States his sleep has been "fine" and appetite has been "good". He denies suicidal, homicidal ideations. He denies auditory visual hallucinations or paranoia. There is no gross evidence of psychosis or mania. Overall, he makes fair eye contact, although has some periods of intense staring. He asks whether he can go to the open mileu of the unit. At this time, pt is allowed onto the open mileu during meal times. Discussed area will be open to pt at lunch time. He verbalized understanding. Attempted to speak with pt about his behaviors last night, however, he minimized them and disagreed that he was inappropriate.   Case discussed with attending psychiatrist, Dr. Marval Regal. Pt's oral invega to be be re-started at 6mg  oral daily. Continue to monitor. Pt continues to be on [facility] priority waitlist.  Past Psychiatric History:  "Per chart review, patient has a hx of [name] with multiple inpatient admissions. Last encounter was sept 4, 2024 when he was evaluated at ED and sent to [facility] for treatment. "  Risk to Self: Pt denies suicidal  ideations Risk to Others: Pt denies homicidal ideations Prior Inpatient Therapy: Yes, multiple Prior Outpatient Therapy: Yes, pt with [facility] ACT team  Past Medical History:  Past Medical History:  Diagnosis Date   Aggressive behavior    Asthma    Depression    Psychosis (HCC)    Schizoaffective disorder (HCC)     Past Surgical History:  Procedure Laterality Date   BACK SURGERY     I&D groin  2017   Family History: History [name]. No pertinent family history. Family Psychiatric  History: None reported Social History:  Social History   Substance and Sexual Activity  Alcohol Use No     Social History   Substance and Sexual Activity  Drug Use Yes   Types: Marijuana, Methamphetamines, Cocaine   Comment: reports cannabis use as funds permit and use of crack cocaine and meth when able    Social History   Socioeconomic History   Marital status: Single    Spouse name: Not on file   Number of children: Not on file   Years of education: Not on file   Highest education level: Not on file  Occupational History   Not on file  Tobacco Use   Smoking status: Every Day    Current packs/day: 0.25    Types: Cigarettes   Smokeless tobacco: Never  Vaping Use   Vaping status: Some Days  Substance and Sexual Activity   Alcohol use: No   Drug use: Yes    Types: Marijuana, Methamphetamines, Cocaine    Comment: reports cannabis use as funds permit and use of crack  cocaine and meth when able   Sexual activity: Not Currently    Partners: Female  Other Topics Concern   Not on file  Social History Narrative   Not on file   Social Determinants of Health   Financial Resource Strain: Not on file  Food Insecurity: No Food Insecurity (11/04/2022)   Hunger Vital Sign    Worried About Running Out of Food in the Last Year: Never true    Ran Out of Food in the Last Year: Never true  Transportation Needs: No Transportation Needs (11/04/2022)   PRAPARE - Scientist, research (physical sciences) (Medical): No    Lack of Transportation (Non-Medical): No  Physical Activity: Not on file  Stress: Not on file  Social Connections: Not on file   Additional Social History:    Allergies:  No Known Allergies  Labs: No results found for this or any previous visit (from the past 48 hour(s)).  Current Facility-Administered Medications  Medication Dose Route Frequency Provider Last Rate Last Admin   amLODipine (NORVASC) tablet 10 mg  10 mg Oral Daily [name] E, NP   10 mg at 05/25/23 0857   chlorproMAZINE (THORAZINE) tablet 50 mg  50 mg Oral TID Lauree Chandler, NP   50 mg at 05/25/23 2101   divalproex (DEPAKOTE ER) 24 hr tablet 750 mg  750 mg Oral BID Lauree Chandler, NP   750 mg at 05/25/23 2101   famotidine (PEPCID) tablet 20 mg  20 mg Oral BID PRN Shaune Pollack, MD       fluticasone Aleda Grana) 50 MCG/ACT nasal spray 2 spray  2 spray Each Nare Daily Shaune Pollack, MD   2 spray at 05/25/23 0859   multivitamin with minerals tablet 1 tablet  1 tablet Oral Daily Barrie Folk, [facility]   1 tablet at 05/25/23 8295   nicotine polacrilex (NICORETTE) gum 2 mg  2 mg Oral PRN Chales Abrahams, NP       OLANZapine zydis (ZYPREXA) disintegrating tablet 10 mg  10 mg Oral Q8H PRN [name] E, NP   10 mg at 05/23/23 1319   And   ziprasidone (GEODON) injection 20 mg  20 mg Intramuscular PRN Chales Abrahams, NP       propranolol (INDERAL) tablet 10 mg  10 mg Oral TID [name] E, NP   10 mg at 05/25/23 2101   traZODone (DESYREL) tablet 100 mg  100 mg Oral QHS Phineas Semen, MD   100 mg at 05/25/23 2101   Current Outpatient Medications  Medication Sig Dispense Refill   chlorproMAZINE (THORAZINE) 25 MG tablet Take 25 mg by mouth 3 (three) times daily.     cloZAPine (CLOZARIL) 100 MG tablet Take 150 mg by mouth 2 (two) times daily.     divalproex (DEPAKOTE ER) 500 MG 24 hr tablet Take 1 tablet (500 mg total) by mouth 2 (two) times daily. 60 tablet 1    glycopyrrolate (ROBINUL) 2 MG tablet Take 2 mg by mouth 2 (two) times daily.     Multiple Vitamin (MULTIVITAMIN ADULT PO) Take 1 tablet by mouth daily.     nicotine (NICODERM CQ - DOSED IN MG/24 HOURS) 21 mg/24hr patch Place 1 patch (21 mg total) onto the skin daily. 28 patch 1   nicotine polacrilex (NICORETTE) 2 MG gum Take 1 each (2 mg total) by mouth as needed for smoking cessation. 100 tablet 1   paliperidone (INVEGA SUSTENNA) 234 MG/1.5ML injection Inject 234 mg  into the muscle every 28 (twenty-eight) days. 1.8 mL 1   traZODone (DESYREL) 100 MG tablet Take 1 tablet (100 mg total) by mouth at bedtime. 30 tablet 1   amLODipine (NORVASC) 10 MG tablet Take 1 tablet (10 mg total) by mouth daily. (Patient not taking: Reported on 03/27/2023) 30 tablet 1   clonazePAM (KLONOPIN) 0.5 MG tablet Take 1 tablet (0.5 mg total) by mouth 2 (two) times daily. (Patient not taking: Reported on 03/27/2023) 60 tablet 0   glycopyrrolate (ROBINUL) 1 MG tablet Take by mouth. (Patient not taking: Reported on 05/02/2023)     Glycopyrrolate 1.5 MG TABS Take 1 tablet by mouth 2 (two) times daily. (Patient not taking: Reported on 05/02/2023)     haloperidol (HALDOL) 5 MG tablet Take 1 tablet (5 mg total) by mouth at bedtime. (Patient not taking: Reported on 03/27/2023) 30 tablet 1   paliperidone (INVEGA) 3 MG 24 hr tablet Take 2 tablets (6 mg total) by mouth daily. (Patient not taking: Reported on 02/14/2023) 60 tablet 1   propranolol (INDERAL) 10 MG tablet Take 1 tablet (10 mg total) by mouth 3 (three) times daily. (Patient not taking: Reported on 03/27/2023) 90 tablet 1   temazepam (RESTORIL) 15 MG capsule Take 15 mg by mouth at bedtime. (Patient not taking: Reported on 05/02/2023)      Musculoskeletal: Strength & Muscle Tone: within normal limits Gait & Station: normal Patient leans: N/A            Psychiatric Specialty Exam:  Presentation  General Appearance:  Appropriate for Environment  Eye  Contact: Fair  Speech: Clear and Coherent  Speech Volume: Normal  Handedness: Right   Mood and Affect  Mood: -- ("good")  Affect: Flat   Thought Process  Thought Processes: Coherent  Descriptions of Associations:Intact  Orientation:Full (Time, Place and Person)  Thought Content:WDL  History of [name]/Schizoaffective disorder:Yes  Duration of Psychotic Symptoms:Greater than six months  Hallucinations:Hallucinations: None  Ideas of Reference:None  Suicidal Thoughts:Suicidal Thoughts: No  Homicidal Thoughts:Homicidal Thoughts: No   Sensorium  Memory: Immediate Fair  Judgment: Intact  Insight: Shallow   Executive Functions  Concentration: Fair  Attention Span: Fair  Recall: Fair  Fund of Knowledge: Fair  Language: Fair   Psychomotor Activity  Psychomotor Activity: Psychomotor Activity: Normal   Assets  Assets: Communication Skills; Desire for Improvement; Financial Resources/Insurance; Leisure Time; Resilience; Physical Health   Sleep  Sleep: Sleep: Fair   Physical Exam: Physical Exam Constitutional:      General: He is not in acute distress.    Appearance: He is not ill-appearing, toxic-appearing or diaphoretic.  Eyes:     General: No scleral icterus. Cardiovascular:     Rate and Rhythm: Normal rate.  Pulmonary:     Effort: Pulmonary effort is normal. No respiratory distress.  Neurological:     Mental Status: He is alert and oriented to person, place, and time.  Psychiatric:        Attention and Perception: Attention and perception normal.        Mood and Affect: Mood normal. Affect is flat.        Speech: Speech normal.        Behavior: Behavior normal. Behavior is cooperative.        Thought Content: Thought content normal.    Review of Systems  Constitutional:  Negative for chills and fever.  Respiratory:  Negative for shortness of breath.   Cardiovascular:  Negative for chest pain and palpitations.   Gastrointestinal:  Negative for abdominal pain.  Neurological:  Negative for headaches.  Psychiatric/Behavioral: Negative.     Blood pressure 127/85, pulse 94, temperature 98.5 F (36.9 C), temperature source Oral, resp. rate 18, height 6\' 1"  (1.854 m), weight 127 kg, SpO2 96%. Body mass index is 36.94 kg/m.  Treatment Plan Summary: Daily contact with patient to assess and evaluate symptoms and progress in treatment, Medication management, and Plan    [name], [name] -Continue thorazine 50mg  oral 3 times daily -Continue depakote er 750mg  oral 2 times daily  -Re-start invega 6mg  oral daily  -Pt received first loading dose of invega sustenna 234mg  IM on 05/14/23. Pt received second loading dose of invega sustenna 156mg  IM on 05/21/23.   Anxiety -Continue inderal 10mg  oral 3 times daily  Insomnia -Continue trazodone 100mg  oral daily at bedtime  Disposition: Pt currently on [facility] priority waitlist.   Lauree Chandler, NP 05/26/2023 9:14 AM

## 2023-05-26 NOTE — ED Notes (Signed)
 [facility] PT [facility] WAITLIST

## 2023-05-26 NOTE — ED Notes (Signed)
Hospital meal provided, pt tolerated w/o complaints.  Waste discarded appropriately.  

## 2023-05-26 NOTE — ED Notes (Signed)
[facility] waitlist

## 2023-05-26 NOTE — ED Notes (Signed)
No other behavioral issues since 2100. Patient currently up and requested for TV to be turned on.

## 2023-05-26 NOTE — BH Assessment (Signed)
Writer received call from Vivien Rossetti at [facility], confirming patient is still on priority wait list.

## 2023-05-26 NOTE — ED Notes (Signed)
Patient requesting to come out of locked area.  Writer reviewed with patient expectations with behaviors while out in dayroom.  Patient voiced understanding and states he will behave.  [employer] police sitting with patient in dayroom while patient listens to music on TV.

## 2023-05-26 NOTE — ED Provider Notes (Signed)
Emergency Medicine Observation Re-evaluation Note  Physical Exam   BP 127/85 (BP Location: Right Arm)   Pulse 94   Temp 98.5 F (36.9 C) (Oral)   Resp 18   Ht 6\' 1"  (1.854 m)   Wt 127 kg   SpO2 96%   BMI 36.94 kg/m   Patient appears in no acute distress.  ED Course / MDM   No reported events during my shift at the time of this note.   Pt is awaiting dispo from consultants   Pilar Jarvis MD    Pilar Jarvis, MD 05/26/23 [phone]

## 2023-05-27 DIAGNOSIS — F203 Undifferentiated schizophrenia: Secondary | ICD-10-CM | POA: Diagnosis not present

## 2023-05-27 NOTE — ED Notes (Signed)
PT IVC/ [facility] WAITING LIST.

## 2023-05-27 NOTE — ED Notes (Signed)
Snack given.

## 2023-05-27 NOTE — ED Notes (Signed)

## 2023-05-27 NOTE — ED Notes (Signed)
Hospital meal provided.  100% consumed, pt tolerated w/o complaints.  Waste discarded appropriately.   

## 2023-05-27 NOTE — ED Notes (Signed)
Writer gave patient PO HS medications with no issues.  Patient offered new behavioral scrub pants due to pants patient is wearing is ripped. Patient refusing at this time.

## 2023-05-27 NOTE — ED Notes (Signed)
Patient beating on window and door. Writer went to see what patient needed. Patient demanding to see the doctor due to being isolated for to long.  Patient stating he is going to have this Clinical research associate fired.

## 2023-05-27 NOTE — Consult Note (Signed)
[facility] Face-to-Face Psychiatry Consult   Reason for Consult:  Admit Referring Physician:  Dr. Concha Se Patient Identification: [name] [name] MRN:  [id number] Principal Diagnosis: Schizophrenia, undifferentiated (HCC) Diagnosis:  Principal Problem:   Schizophrenia, undifferentiated (HCC)  Total Time spent with patient:  25 minutes  HPI:   Pt chart reviewed and seen on rounds. No significant overnight events. No PRN agitation medications administered since last assessment. Last agitation  medications were administered on 05/23/23 at 1319, zyprexa 10mg  orally.  Pt seen with 2 security officers. Pt affect brightens at Event organ[employer]. Reports his mood is "good". Reports sleep and appetite are "good" as well. Denies suicidal,homicidal ideations. Denies auditory visual hallucinations or paranoia. Pt is looking forward to coming out into open mileu for lunch. Reviewed appropriate behavior needed for discharge and pt verbalized understanding. He tells me he is having conflicts with night staff although will have appropriate behavior.   Attempted to provide update to pt's mother at [phone]. Unable to reach her.  Attempted to provide update to pt's ACT team [phone]. Unable to reach staff.   Past Psychiatric History:  "Per chart review, patient has a hx of Schizophrenia with multiple inpatient admissions. Last encounter was sept 4, 2024 when he was evaluated at ED and sent to [facility] for treatment. "  Risk to Self:  Pt denies suicidal ideations Risk to Others:  Pt denies homicidal ideations Prior Inpatient Therapy:  Yes multiple Prior Outpatient Therapy:  Yes pt with [facility] ACT team  Past Medical History:  Past Medical History:  Diagnosis Date   Aggressive behavior    Asthma    Depression    Psychosis (HCC)    Schizoaffective disorder (HCC)     Past Surgical History:  Procedure Laterality Date   BACK SURGERY     I&D groin  2017   Family History:  History reviewed. No pertinent family history. Family Psychiatric  History: None reported Social History:  Social History   Substance and Sexual Activity  Alcohol Use No     Social History   Substance and Sexual Activity  Drug Use Yes   Types: Marijuana, Methamphetamines, Cocaine   Comment: reports cannabis use as funds permit and use of crack cocaine and meth when able    Social History   Socioeconomic History   Marital status: Single    Spouse name: Not on file   Number of children: Not on file   Years of education: Not on file   Highest education level: Not on file  Occupational History   Not on file  Tobacco Use   Smoking status: Every Day    Current packs/day: 0.25    Types: Cigarettes   Smokeless tobacco: Never  Vaping Use   Vaping status: Some Days  Substance and Sexual Activity   Alcohol use: No   Drug use: Yes    Types: Marijuana, Methamphetamines, Cocaine    Comment: reports cannabis use as funds permit and use of crack cocaine and meth when able   Sexual activity: Not Currently    Partners: Female  Other Topics Concern   Not on file  Social History Narrative   Not on file   Social Determinants of Health   Financial Resource Strain: Not on file  Food Insecurity: No Food Insecurity (11/04/2022)   Hunger Vital Sign    Worried About Running Out of Food in the Last Year: Never true    Ran Out of Food in the Last Year: Never  true  Transportation Needs: No Transportation Needs (11/04/2022)   PRAPARE - Administrator, Civil Service (Medical): No    Lack of Transportation (Non-Medical): No  Physical Activity: Not on file  Stress: Not on file  Social Connections: Not on file   Additional Social History:    Allergies:  No Known Allergies  Labs: No results found for this or any previous visit (from the past 48 hour(s)).  Current Facility-Administered Medications  Medication Dose Route Frequency Provider Last Rate Last Admin   amLODipine  (NORVASC) tablet 10 mg  10 mg Oral Daily [name] E, NP   10 mg at 05/26/23 5621   chlorproMAZINE (THORAZINE) tablet 50 mg  50 mg Oral TID Lauree Chandler, NP   50 mg at 05/26/23 2113   divalproex (DEPAKOTE ER) 24 hr tablet 750 mg  750 mg Oral BID Lauree Chandler, NP   750 mg at 05/26/23 2113   famotidine (PEPCID) tablet 20 mg  20 mg Oral BID PRN Shaune Pollack, MD       fluticasone Aleda Grana) 50 MCG/ACT nasal spray 2 spray  2 spray Each Nare Daily Shaune Pollack, MD   2 spray at 05/25/23 0859   multivitamin with minerals tablet 1 tablet  1 tablet Oral Daily Barrie Folk, [facility]   1 tablet at 05/26/23 3086   nicotine polacrilex (NICORETTE) gum 2 mg  2 mg Oral PRN Chales Abrahams, NP       OLANZapine zydis (ZYPREXA) disintegrating tablet 10 mg  10 mg Oral Q8H PRN [name] E, NP   10 mg at 05/23/23 1319   And   ziprasidone (GEODON) injection 20 mg  20 mg Intramuscular PRN Chales Abrahams, NP       paliperidone (INVEGA) 24 hr tablet 6 mg  6 mg Oral Daily Lauree Chandler, NP       propranolol (INDERAL) tablet 10 mg  10 mg Oral TID [name] E, NP   10 mg at 05/26/23 2113   traZODone (DESYREL) tablet 100 mg  100 mg Oral QHS Phineas Semen, MD   100 mg at 05/26/23 2113   Current Outpatient Medications  Medication Sig Dispense Refill   chlorproMAZINE (THORAZINE) 25 MG tablet Take 25 mg by mouth 3 (three) times daily.     cloZAPine (CLOZARIL) 100 MG tablet Take 150 mg by mouth 2 (two) times daily.     divalproex (DEPAKOTE ER) 500 MG 24 hr tablet Take 1 tablet (500 mg total) by mouth 2 (two) times daily. 60 tablet 1   glycopyrrolate (ROBINUL) 2 MG tablet Take 2 mg by mouth 2 (two) times daily.     Multiple Vitamin (MULTIVITAMIN ADULT PO) Take 1 tablet by mouth daily.     nicotine (NICODERM CQ - DOSED IN MG/24 HOURS) 21 mg/24hr patch Place 1 patch (21 mg total) onto the skin daily. 28 patch 1   nicotine polacrilex (NICORETTE) 2 MG gum Take 1 each (2 mg total) by mouth as  needed for smoking cessation. 100 tablet 1   paliperidone (INVEGA SUSTENNA) 234 MG/1.5ML injection Inject 234 mg into the muscle every 28 (twenty-eight) days. 1.8 mL 1   traZODone (DESYREL) 100 MG tablet Take 1 tablet (100 mg total) by mouth at bedtime. 30 tablet 1   amLODipine (NORVASC) 10 MG tablet Take 1 tablet (10 mg total) by mouth daily. (Patient not taking: Reported on 03/27/2023) 30 tablet 1   clonazePAM (KLONOPIN) 0.5 MG tablet Take 1 tablet (  0.5 mg total) by mouth 2 (two) times daily. (Patient not taking: Reported on 03/27/2023) 60 tablet 0   glycopyrrolate (ROBINUL) 1 MG tablet Take by mouth. (Patient not taking: Reported on 05/02/2023)     Glycopyrrolate 1.5 MG TABS Take 1 tablet by mouth 2 (two) times daily. (Patient not taking: Reported on 05/02/2023)     haloperidol (HALDOL) 5 MG tablet Take 1 tablet (5 mg total) by mouth at bedtime. (Patient not taking: Reported on 03/27/2023) 30 tablet 1   paliperidone (INVEGA) 3 MG 24 hr tablet Take 2 tablets (6 mg total) by mouth daily. (Patient not taking: Reported on 02/14/2023) 60 tablet 1   propranolol (INDERAL) 10 MG tablet Take 1 tablet (10 mg total) by mouth 3 (three) times daily. (Patient not taking: Reported on 03/27/2023) 90 tablet 1   temazepam (RESTORIL) 15 MG capsule Take 15 mg by mouth at bedtime. (Patient not taking: Reported on 05/02/2023)      Musculoskeletal: Strength & Muscle Tone: within normal limits Gait & Station: normal Patient leans: N/A            Psychiatric Specialty Exam:  Presentation  General Appearance:  Appropriate for Environment  Eye Contact: Fair; Other (comment) (overall fair, some periods of intense staring)  Speech: Clear and Coherent  Speech Volume: Normal  Handedness: Right   Mood and Affect  Mood: -- ("good")  Affect: Flat   Thought Process  Thought Processes: Coherent  Descriptions of Associations:Intact  Orientation:Full (Time, Place and Person)  Thought  Content:WDL  History of Schizophrenia/Schizoaffective disorder:Yes  Duration of Psychotic Symptoms:Greater than six months  Hallucinations:Hallucinations: None  Ideas of Reference:None  Suicidal Thoughts:Suicidal Thoughts: No  Homicidal Thoughts:Homicidal Thoughts: No   Sensorium  Memory: Immediate Fair  Judgment: Intact  Insight: Shallow   Executive Functions  Concentration: Fair  Attention Span: Fair  Recall: Fair  Fund of Knowledge: Fair  Language: Fair   Psychomotor Activity  Psychomotor Activity: Psychomotor Activity: Normal   Assets  Assets: Communication Skills; Desire for Improvement; Financial Resources/Insurance; Leisure Time; Physical Health; Resilience   Sleep  Sleep: Sleep: Good   Physical Exam: Physical Exam Constitutional:      General: He is not in acute distress.    Appearance: He is not ill-appearing, toxic-appearing or diaphoretic.  Eyes:     General: No scleral icterus. Cardiovascular:     Rate and Rhythm: Normal rate.  Pulmonary:     Effort: Pulmonary effort is normal. No respiratory distress.  Neurological:     Mental Status: He is alert and oriented to person, place, and time.  Psychiatric:        Attention and Perception: Attention and perception normal.        Mood and Affect: Mood normal. Affect is flat.        Speech: Speech normal.        Behavior: Behavior normal. Behavior is cooperative.        Thought Content: Thought content normal.    Review of Systems  Constitutional:  Negative for chills and fever.  Respiratory:  Negative for shortness of breath.   Cardiovascular:  Negative for chest pain and palpitations.  Gastrointestinal:  Negative for abdominal pain.  Neurological:  Negative for headaches.  Psychiatric/Behavioral: Negative.     Blood pressure (!) 133/92, pulse 93, temperature 97.9 F (36.6 C), temperature source Oral, resp. rate 18, height 6\' 1"  (1.854 m), weight 127 kg, SpO2 98%. Body mass  index is 36.94 kg/m.  Treatment Plan Summary: Daily  contact with patient to assess and evaluate symptoms and progress in treatment, Medication management, and Plan    Daily contact with patient to assess and evaluate symptoms and progress in treatment, Medication management, and Plan     Schizophrenia, undifferentiated -Continue thorazine 50mg  oral 3 times daily -Continue depakote er 750mg  oral 2 times daily  -Continue invega 6mg  oral daily  -Pt received first loading dose of invega sustenna 234mg  IM on 05/14/23. Pt received second loading dose of invega sustenna 156mg  IM on 05/21/23.    Anxiety -Continue inderal 10mg  oral 3 times daily   Insomnia -Continue trazodone 100mg  oral daily at bedtime  Disposition: Pt on [facility] priority list  Lauree Chandler, NP 05/27/2023 9:16 AM

## 2023-05-27 NOTE — ED Notes (Signed)
Pt requested his 1hr daily time to sit in the day room with staff to listen to music.  Staff notified [employer] who came in to sit with [name] [name]. Cont to monitor as ordered

## 2023-05-27 NOTE — ED Notes (Signed)
[facility] Wait List

## 2023-05-27 NOTE — ED Provider Notes (Signed)
Emergency Medicine Observation Re-evaluation Note  Physical Exam   BP (!) 143/91 (BP Location: Right Arm)   Pulse (!) 112   Temp (!) 97.5 F (36.4 C) (Oral)   Resp 16   Ht 6\' 1"  (1.854 m)   Wt 127 kg   SpO2 97%   BMI 36.94 kg/m   Patient appears in no acute distress.  ED Course / MDM   No reported events during my shift at the time of this note.   Pt is awaiting dispo from consultants   Pilar Jarvis MD    Pilar Jarvis, MD 05/27/23 [phone]

## 2023-05-27 NOTE — BH Assessment (Signed)
Confirmed with [facility] ([phone]), patient is on their Waitlist.

## 2023-05-28 MED ORDER — FLUTICASONE PROPIONATE 50 MCG/ACT NA SUSP: 2.0000 | Freq: Every day | NASAL | Status: AC

## 2023-05-28 MED ORDER — DIVALPROEX SODIUM ER 250 MG PO TB24: 750.0000 mg | ORAL_TABLET | Freq: Two times a day (BID) | ORAL | 0 refills | Status: AC

## 2023-05-28 MED ORDER — ADULT MULTIVITAMIN W/MINERALS CH: 1.0000 | ORAL_TABLET | Freq: Every day | ORAL | 0 refills | Status: DC

## 2023-05-28 MED ORDER — NICOTINE POLACRILEX 2 MG MT GUM: 2.0000 mg | CHEWING_GUM | OROMUCOSAL | Status: AC | PRN

## 2023-05-28 MED ORDER — PALIPERIDONE ER 3 MG PO TB24: 6.0000 mg | ORAL_TABLET | Freq: Every day | ORAL | 0 refills | Status: AC

## 2023-05-28 MED ORDER — ADULT MULTIVITAMIN W/MINERALS CH: 1.0000 | ORAL_TABLET | Freq: Every day | ORAL | 0 refills | Status: AC

## 2023-05-28 MED ORDER — FAMOTIDINE 20 MG PO TABS: 20.0000 mg | ORAL_TABLET | Freq: Two times a day (BID) | ORAL | 0 refills | Status: AC | PRN

## 2023-05-28 MED ORDER — PROPRANOLOL HCL 10 MG PO TABS: 10.0000 mg | ORAL_TABLET | Freq: Three times a day (TID) | ORAL | 0 refills | Status: AC

## 2023-05-28 MED ORDER — CHLORPROMAZINE HCL 50 MG PO TABS: 50.0000 mg | ORAL_TABLET | Freq: Three times a day (TID) | ORAL | 0 refills | Status: AC

## 2023-05-28 MED ORDER — AMLODIPINE BESYLATE 10 MG PO TABS: 10.0000 mg | ORAL_TABLET | Freq: Every day | ORAL | 0 refills | Status: AC

## 2023-05-28 MED ORDER — TRAZODONE HCL 100 MG PO TABS: 100.0000 mg | ORAL_TABLET | Freq: Every day | ORAL | 0 refills | Status: AC

## 2023-05-28 MED ORDER — PALIPERIDONE PALMITATE ER 234 MG/1.5ML IM SUSY: 234.0000 mg | PREFILLED_SYRINGE | INTRAMUSCULAR | Status: AC

## 2023-05-28 NOTE — ED Notes (Signed)
Pt is A/Ox 4, [name] declines any SI/HI stated that he denies A/V hallucinations.  Discharge instructions reviewed with patient and mother, they verbalized understanding.  All Belongings accounted for and returned to PT.  Pt left ambulatory with mother via [employer] service

## 2023-05-28 NOTE — ED Notes (Signed)
Writer along with Security officer Fredia Sorrow took patient new scrubs due to scrubs patient is wearing are ripped. Tourist information centre manager asked patient nicely to not sing so loudly and stop running and jumping around the unit due to other patients trying to sleep.  Security officer Fredia Sorrow turned patient TV to another channel per patient's request.

## 2023-05-28 NOTE — ED Notes (Signed)
Pt sitting in day room eating breakfast.  Socializing well with staff and peers.  Pt stated that "I like sitting out here with everyone, this is a good compromise".  [name] [name] informed that his mother would be here around 1600 for a visit, He stated "that's great, I can't wait to introduce her to everyone"  Staff made positive comments r/t this.  Pt has bright affect, good eye contact, and is using appropriate verbiage.  [name] [name] is showing future oriented thoughts process as he makes plans after discharge.  Cont to monitor pt as ordered.

## 2023-05-28 NOTE — BH Assessment (Signed)
IVC PAPERS  RESCINDED PER  J  LEE  NP  INFORMED  KATE  RN

## 2023-05-28 NOTE — ED Notes (Signed)
[name] [name] continues to present at baseline.  Calm, and compliant with medications. Cont to monitor as ordered.

## 2023-05-28 NOTE — ED Notes (Incomplete)
Writer along with security officer Fredia Sorrow took patient new behaviral scrubs due to patient has ripped cur

## 2023-05-28 NOTE — ED Notes (Signed)
Hospital meal provided, pt tolerated w/o complaints.  Waste discarded appropriately.  

## 2023-05-28 NOTE — Consult Note (Cosign Needed Addendum)
[facility] Face-to-Face Psychiatry Consult   Reason for Consult:  Admit Referring Physician:  Dr. Alben Spittle. [place] Patient Identification: [name] [name] MRN:  [id number] Principal Diagnosis: Schizophrenia, undifferentiated (HCC) Diagnosis:  Principal Problem:   Schizophrenia, undifferentiated (HCC)  Total Time spent with patient, including time coordinating care with collateral: 45 minutes  HPI:   Pt chart reviewed and seen on rounds. No significant overnight events. Last agitation medication was administered on 05/23/23 at 1319, zyprexa 10mg  oral.   Pt is sitting in the open mileu with a Engineer, materials. Pt is calm, cooperative, pleasant. Reports "good" mood today. Reports he slept well and his appetite is good. He denies suicidal, homicidal ideations. He denies auditory visual hallucinations or paranoia. There is no gross evidence of psychosis or mania. Pt presents with tremors today. When asked about this, pt states he has had tremors since he was a child and they do not bother him. Discussed that tremors could be a possible side effect of his medications. He declines medication changes and states he will work with his ACT team when discharged to make medication changes. Discussed his mother is to visit him today at [facility] and he is agreeable with this. Discussed when pt is discharged he will need to follow up with his ACT team, continue taking his medications. He verbalized understanding. Discussed refraining from use of substances including alcohol, crack/cocaine, methamphetamines, and marijuana (which he has history of heavy use). Discussed concerns of psychiatric decompensation if he does not take his medications and continues to use substances. He verbalized understanding.  Spoke w/ pt's mother, [name] [phone]. Discussed her visiting pt today to help in determining disposition. She states she can visit pt today at 4PM. We discussed possible discharge today pending visit. She is in agreement  with this plan.  Attempted to call [facility] ACT team [phone], unsuccessful. Was able to reach [place] with the [facility] ACT Team [phone]. Discussed possible discharge pending mother's visit today. They will schedule him for an in-person visit for tomorrow between 11-12PM.   Update: Pt's mother came to visit pt at ED. Pt with bright affect at seeing his mother. After her visit with pt, pt's mother verbalized feeling pt is safe to discharge home. Reviewed pt's ACT team is to follow up with pt tomorrow. She did express that she would not be home at the time and would be home on Friday. Ideally, she would like to be present for ACT team visit. We discussed discharging pt home tomorrow for this to happen and to reschedule ACT team visit. However, she declined and would like to bring pt today. She is in agreement with discharge today and ACT team to follow up with pt tomorrow. At discharge, pt verbalized his intention to continue taking his medications and to follow up with his ACT team tomorrow.   Past Psychiatric History: "Per chart review, patient has a hx of Schizophrenia with multiple inpatient admissions. Last encounter was sept 4, 2024 when he was evaluated at ED and sent to [facility] for treatment. "  Risk to Self: Denies suicidal ideations Risk to Others: Denies homicidal ideations Prior Inpatient Therapy: Yes, multiple Prior Outpatient Therapy: Yes, currently being followed by the [facility] ACT team  Past Medical History:  Past Medical History:  Diagnosis Date   Aggressive behavior    Asthma    Depression    Psychosis (HCC)    Schizoaffective disorder [facility])     Past Surgical History:  Procedure Laterality Date  BACK SURGERY     I&D groin  2017   Family History: History reviewed. No pertinent family history. Family Psychiatric  History: None reported Social History:  Social History   Substance and Sexual Activity  Alcohol Use No     Social History    Substance and Sexual Activity  Drug Use Yes   Types: Marijuana, Methamphetamines, Cocaine   Comment: reports cannabis use as funds permit and use of crack cocaine and meth when able    Social History   Socioeconomic History   Marital status: Single    Spouse name: Not on file   Number of children: Not on file   Years of education: Not on file   Highest education level: Not on file  Occupational History   Not on file  Tobacco Use   Smoking status: Every Day    Current packs/day: 0.25    Types: Cigarettes   Smokeless tobacco: Never  Vaping Use   Vaping status: Some Days  Substance and Sexual Activity   Alcohol use: No   Drug use: Yes    Types: Marijuana, Methamphetamines, Cocaine    Comment: reports cannabis use as funds permit and use of crack cocaine and meth when able   Sexual activity: Not Currently    Partners: Female  Other Topics Concern   Not on file  Social History Narrative   Not on file   Social Determinants of Health   Financial Resource Strain: Not on file  Food Insecurity: No Food Insecurity (11/04/2022)   Hunger Vital Sign    Worried About Running Out of Food in the Last Year: Never true    Ran Out of Food in the Last Year: Never true  Transportation Needs: No Transportation Needs (11/04/2022)   PRAPARE - Administrator, Civil Service (Medical): No    Lack of Transportation (Non-Medical): No  Physical Activity: Not on file  Stress: Not on file  Social Connections: Not on file   Allergies:  No Known Allergies  Labs: No results found for this or any previous visit (from the past 48 hour(s)).  Current Facility-Administered Medications  Medication Dose Route Frequency Provider Last Rate Last Admin   amLODipine (NORVASC) tablet 10 mg  10 mg Oral Daily [name] E, NP   10 mg at 05/28/23 1610   chlorproMAZINE (THORAZINE) tablet 50 mg  50 mg Oral TID Lauree Chandler, NP   50 mg at 05/28/23 1524   divalproex (DEPAKOTE ER) 24 hr tablet  750 mg  750 mg Oral BID Lauree Chandler, NP   750 mg at 05/28/23 9604   famotidine (PEPCID) tablet 20 mg  20 mg Oral BID PRN Shaune Pollack, MD       fluticasone Aleda Grana) 50 MCG/ACT nasal spray 2 spray  2 spray Each Nare Daily Shaune Pollack, MD   2 spray at 05/27/23 1017   multivitamin with minerals tablet 1 tablet  1 tablet Oral Daily Barrie Folk, [facility]   1 tablet at 05/28/23 5409   nicotine polacrilex (NICORETTE) gum 2 mg  2 mg Oral PRN Chales Abrahams, NP       OLANZapine zydis (ZYPREXA) disintegrating tablet 10 mg  10 mg Oral Q8H PRN [name] E, NP   10 mg at 05/23/23 1319   And   ziprasidone (GEODON) injection 20 mg  20 mg Intramuscular PRN Chales Abrahams, NP       paliperidone (INVEGA) 24 hr tablet 6 mg  6  mg Oral Daily Lauree Chandler, NP   6 mg at 05/28/23 1478   propranolol (INDERAL) tablet 10 mg  10 mg Oral TID Chales Abrahams, NP   10 mg at 05/28/23 1524   traZODone (DESYREL) tablet 100 mg  100 mg Oral QHS Phineas Semen, MD   100 mg at 05/27/23 2107   Current Outpatient Medications  Medication Sig Dispense Refill   amLODipine (NORVASC) 10 MG tablet Take 1 tablet (10 mg total) by mouth daily. 30 tablet 0   chlorproMAZINE (THORAZINE) 50 MG tablet Take 1 tablet (50 mg total) by mouth 3 (three) times daily. 90 tablet 0   divalproex (DEPAKOTE ER) 250 MG 24 hr tablet Take 3 tablets (750 mg total) by mouth 2 (two) times daily. 180 tablet 0   famotidine (PEPCID) 20 MG tablet Take 1 tablet (20 mg total) by mouth 2 (two) times daily as needed for heartburn. 60 tablet 0   [START ON 05/29/2023] fluticasone (FLONASE) 50 MCG/ACT nasal spray Place 2 sprays into both nostrils daily.     [START ON 05/29/2023] Multiple Vitamin (MULTIVITAMIN WITH MINERALS) TABS tablet Take 1 tablet by mouth daily. 30 tablet 0   nicotine polacrilex (NICORETTE) 2 MG gum Take 1 each (2 mg total) by mouth as needed for smoking cessation.     paliperidone (INVEGA SUSTENNA) 234 MG/1.5ML injection  Inject 234 mg into the muscle every 28 (twenty-eight) days.     paliperidone (INVEGA) 3 MG 24 hr tablet Take 2 tablets (6 mg total) by mouth daily. 60 tablet 0   propranolol (INDERAL) 10 MG tablet Take 1 tablet (10 mg total) by mouth 3 (three) times daily. 90 tablet 0   traZODone (DESYREL) 100 MG tablet Take 1 tablet (100 mg total) by mouth at bedtime. 30 tablet 0   Musculoskeletal: Strength & Muscle Tone:  sitting on assessment Gait & Station:  sitting on assessment Patient leans:  sitting on assessment  Psychiatric Specialty Exam:  Presentation  General Appearance:  Appropriate for Environment  Eye Contact: Fair; Other (comment) (overall fair, some periods of intense staring)  Speech: Clear and Coherent  Speech Volume: Normal  Handedness: Right   Mood and Affect  Mood: -- ("good")  Affect: Flat   Thought Process  Thought Processes: Coherent  Descriptions of Associations:Intact  Orientation:Full (Time, Place and Person)  Thought Content:WDL  History of Schizophrenia/Schizoaffective disorder:Yes  Duration of Psychotic Symptoms:Greater than six months  Hallucinations:Hallucinations: None  Ideas of Reference:None  Suicidal Thoughts:Suicidal Thoughts: No  Homicidal Thoughts:Homicidal Thoughts: No   Sensorium  Memory: Immediate Fair  Judgment: Intact  Insight: Shallow   Executive Functions  Concentration: Fair  Attention Span: Fair  Recall: Fair  Fund of Knowledge: Fair  Language: Fair   Psychomotor Activity  Psychomotor Activity: Psychomotor Activity: Tremor   Assets  Assets: Communication Skills; Desire for Improvement; Financial Resources/Insurance; Leisure Time; Physical Health   Sleep  Sleep: Sleep: Good   Physical Exam: Physical Exam Constitutional:      General: He is not in acute distress.    Appearance: He is not ill-appearing, toxic-appearing or diaphoretic.  Eyes:     General: No scleral  icterus. Cardiovascular:     Rate and Rhythm: Normal rate.  Pulmonary:     Effort: Pulmonary effort is normal. No respiratory distress.  Neurological:     Mental Status: He is alert and oriented to person, place, and time.  Psychiatric:        Attention and Perception: Attention and perception  normal.        Mood and Affect: Mood normal. Affect is flat.        Speech: Speech normal.        Behavior: Behavior normal. Behavior is cooperative.        Thought Content: Thought content normal.    Review of Systems  Constitutional:  Negative for chills and fever.  Respiratory:  Negative for shortness of breath.   Cardiovascular:  Negative for chest pain and palpitations.  Gastrointestinal:  Negative for abdominal pain.  Neurological:  Negative for headaches.  Psychiatric/Behavioral: Negative.     Blood pressure 114/84, pulse 89, temperature 98 F (36.7 C), temperature source Oral, resp. rate 18, height 6\' 1"  (1.854 m), weight 127 kg, SpO2 98%. Body mass index is 36.94 kg/m.  Treatment Plan Summary: Daily contact with patient to assess and evaluate symptoms and progress in treatment, Medication management, and Plan    27 y/o male w/ history of schizophrenia, admitted to [facility] under IVC on 05/02/23, for psychosis, apparently had been off of his medications for 30 days. Has been in the emergency department on [facility] priority waitlist without bed placement. Pt appears to have reasonably stabilized at this point although is at high risk for psychiatric decompensation if he continues to use substances or stops taking his medication. Close follow up is in place with ACT team planning on visiting pt tomorrow. This case was staffed with attending psychiatrist, Dr. Marlou Porch, who agreed with plan for discharge and close follow up.  Disposition: No evidence of imminent risk to self or others at present.   Patient does not meet criteria for psychiatric inpatient admission. Supportive therapy provided about  ongoing stressors. Discussed crisis plan, support from social network, calling 911, coming to the Emergency Department, and calling Suicide Hotline.  Lauree Chandler, NP 05/28/2023 5:40 PM

## 2023-05-28 NOTE — ED Provider Notes (Signed)
Emergency Medicine Observation Re-evaluation Note  [name] [name] is a 27 y.o. male, seen on rounds today.  Pt initially presented to the ED for complaints of Psychiatric Evaluation  Currently, the patient is is no acute distress. Denies any concerns at this time.  Physical Exam  Blood pressure 114/84, pulse 89, temperature 98 F (36.7 C), temperature source Oral, resp. rate 18, height 6\' 1"  (1.854 m), weight 127 kg, SpO2 98%.  Physical Exam: General: No apparent distress      ED Course / MDM     I have reviewed the labs performed to date as well as medications administered while in observation.  Patient was evaluated today by psychiatry.  Patient's mother is here and after shared decision making feel safe with the patient discharged back home with mother.  Psychiatry rescinded involuntary commitment.  Plan to discharge home with mother  Plan   Current plan: Discharge home with mother Patient is not under full IVC at this time.    Corena Herter, MD 05/28/23 1721

## 2023-05-28 NOTE — ED Provider Notes (Signed)
Emergency Medicine Observation Re-evaluation Note  [name] [name] is a 27 y.o. male, seen on rounds today.  Pt initially presented to the ED for complaints of Psychiatric Evaluation    Physical Exam  BP 111/80 (BP Location: Right Arm)   Pulse 91   Temp 98.2 F (36.8 C) (Oral)   Resp 17   Ht 6\' 1"  (1.854 m)   Wt 127 kg   SpO2 97%   BMI 36.94 kg/m  Physical Exam General: nad  ED Course / MDM  EKG:EKG Interpretation Date/Time:  Saturday May 17 2023 18:16:29 EDT Ventricular Rate:  70 PR Interval:  134 QRS Duration:  98 QT Interval:  372 QTC Calculation: 401 R Axis:   78  Text Interpretation: Normal sinus rhythm with sinus arrhythmia Abnormal QRS-T angle, consider primary T wave abnormality Abnormal ECG When compared with ECG of 17-May-2023 18:13, (unconfirmed) Criteria for Lateral infarct are no longer Present Confirmed by UNCONFIRMED, DOCTOR ([id number]), editor Lonell Face [phone]) on 05/19/2023 8:23:39 AM  I have reviewed the labs performed to date as well as medications administered while in observation.    Plan  Current plan is for toc.    Willy Eddy, MD 05/28/23 [phone]
